# Patient Record
Sex: Female | Born: 1960 | Race: Black or African American | Hispanic: No | Marital: Married | Smoking: Former smoker
Health system: Southern US, Community
[De-identification: ages and names within clinical notes are randomized; demographics above are authoritative.]

## PROBLEM LIST (undated history)

## (undated) DIAGNOSIS — I10 Essential (primary) hypertension: Secondary | ICD-10-CM

## (undated) DIAGNOSIS — E119 Type 2 diabetes mellitus without complications: Secondary | ICD-10-CM

## (undated) DIAGNOSIS — T7840XA Allergy, unspecified, initial encounter: Secondary | ICD-10-CM

## (undated) DIAGNOSIS — K635 Polyp of colon: Secondary | ICD-10-CM

## (undated) DIAGNOSIS — M75101 Unspecified rotator cuff tear or rupture of right shoulder, not specified as traumatic: Secondary | ICD-10-CM

## (undated) DIAGNOSIS — G4730 Sleep apnea, unspecified: Secondary | ICD-10-CM

## (undated) DIAGNOSIS — E039 Hypothyroidism, unspecified: Secondary | ICD-10-CM

## (undated) DIAGNOSIS — E0590 Thyrotoxicosis, unspecified without thyrotoxic crisis or storm: Secondary | ICD-10-CM

## (undated) DIAGNOSIS — K219 Gastro-esophageal reflux disease without esophagitis: Secondary | ICD-10-CM

## (undated) DIAGNOSIS — E785 Hyperlipidemia, unspecified: Secondary | ICD-10-CM

## (undated) DIAGNOSIS — G709 Myoneural disorder, unspecified: Secondary | ICD-10-CM

## (undated) DIAGNOSIS — K760 Fatty (change of) liver, not elsewhere classified: Secondary | ICD-10-CM

## (undated) DIAGNOSIS — K449 Diaphragmatic hernia without obstruction or gangrene: Secondary | ICD-10-CM

## (undated) DIAGNOSIS — D649 Anemia, unspecified: Secondary | ICD-10-CM

## (undated) DIAGNOSIS — Z5189 Encounter for other specified aftercare: Secondary | ICD-10-CM

## (undated) DIAGNOSIS — I639 Cerebral infarction, unspecified: Secondary | ICD-10-CM

## (undated) HISTORY — DX: Cerebral infarction, unspecified: I63.9

## (undated) HISTORY — DX: Anemia, unspecified: D64.9

## (undated) HISTORY — PX: POLYPECTOMY: SHX149

## (undated) HISTORY — DX: Myoneural disorder, unspecified: G70.9

## (undated) HISTORY — DX: Type 2 diabetes mellitus without complications: E11.9

## (undated) HISTORY — DX: Encounter for other specified aftercare: Z51.89

## (undated) HISTORY — PX: THYROID SURGERY: SHX805

## (undated) HISTORY — DX: Diaphragmatic hernia without obstruction or gangrene: K44.9

## (undated) HISTORY — DX: Essential (primary) hypertension: I10

## (undated) HISTORY — DX: Polyp of colon: K63.5

## (undated) HISTORY — DX: Allergy, unspecified, initial encounter: T78.40XA

## (undated) HISTORY — DX: Fatty (change of) liver, not elsewhere classified: K76.0

## (undated) HISTORY — PX: COLONOSCOPY: SHX174

## (undated) HISTORY — DX: Hyperlipidemia, unspecified: E78.5

---

## 1987-07-30 HISTORY — PX: CHOLECYSTECTOMY: SHX55

## 1991-07-30 HISTORY — PX: TUBAL LIGATION: SHX77

## 1995-07-30 HISTORY — PX: BACK SURGERY: SHX140

## 1996-07-29 DIAGNOSIS — K449 Diaphragmatic hernia without obstruction or gangrene: Secondary | ICD-10-CM

## 1996-07-29 HISTORY — DX: Diaphragmatic hernia without obstruction or gangrene: K44.9

## 1999-01-09 ENCOUNTER — Emergency Department (HOSPITAL_COMMUNITY): Admission: EM | Admit: 1999-01-09 | Discharge: 1999-01-09 | Payer: Self-pay | Admitting: Emergency Medicine

## 1999-01-09 ENCOUNTER — Encounter: Payer: Self-pay | Admitting: Emergency Medicine

## 1999-04-11 ENCOUNTER — Encounter (INDEPENDENT_AMBULATORY_CARE_PROVIDER_SITE_OTHER): Payer: Self-pay

## 1999-04-11 ENCOUNTER — Other Ambulatory Visit: Admission: RE | Admit: 1999-04-11 | Discharge: 1999-04-11 | Payer: Self-pay

## 1999-07-04 ENCOUNTER — Encounter: Payer: Self-pay | Admitting: Gastroenterology

## 1999-07-04 ENCOUNTER — Ambulatory Visit (HOSPITAL_COMMUNITY): Admission: RE | Admit: 1999-07-04 | Discharge: 1999-07-04 | Payer: Self-pay | Admitting: Gastroenterology

## 1999-12-04 ENCOUNTER — Encounter: Payer: Self-pay | Admitting: Orthopedic Surgery

## 1999-12-04 ENCOUNTER — Encounter: Admission: RE | Admit: 1999-12-04 | Discharge: 1999-12-04 | Payer: Self-pay | Admitting: Orthopedic Surgery

## 2000-01-03 ENCOUNTER — Encounter: Payer: Self-pay | Admitting: Orthopedic Surgery

## 2000-01-03 ENCOUNTER — Ambulatory Visit (HOSPITAL_COMMUNITY): Admission: RE | Admit: 2000-01-03 | Discharge: 2000-01-03 | Payer: Self-pay | Admitting: Orthopedic Surgery

## 2000-05-08 ENCOUNTER — Encounter (INDEPENDENT_AMBULATORY_CARE_PROVIDER_SITE_OTHER): Payer: Self-pay

## 2000-05-08 HISTORY — PX: EXPLORATORY LAPAROTOMY: SUR591

## 2000-05-09 ENCOUNTER — Inpatient Hospital Stay (HOSPITAL_COMMUNITY): Admission: AD | Admit: 2000-05-09 | Discharge: 2000-05-10 | Payer: Self-pay

## 2000-06-11 ENCOUNTER — Encounter (INDEPENDENT_AMBULATORY_CARE_PROVIDER_SITE_OTHER): Payer: Self-pay

## 2000-06-11 ENCOUNTER — Other Ambulatory Visit: Admission: RE | Admit: 2000-06-11 | Discharge: 2000-06-11 | Payer: Self-pay | Admitting: Obstetrics and Gynecology

## 2000-06-25 ENCOUNTER — Inpatient Hospital Stay (HOSPITAL_COMMUNITY): Admission: RE | Admit: 2000-06-25 | Discharge: 2000-06-27 | Payer: Self-pay | Admitting: Obstetrics and Gynecology

## 2000-06-25 ENCOUNTER — Encounter (INDEPENDENT_AMBULATORY_CARE_PROVIDER_SITE_OTHER): Payer: Self-pay

## 2000-06-25 HISTORY — PX: ABDOMINAL HYSTERECTOMY: SHX81

## 2001-06-03 ENCOUNTER — Other Ambulatory Visit: Admission: RE | Admit: 2001-06-03 | Discharge: 2001-06-03 | Payer: Self-pay | Admitting: Obstetrics and Gynecology

## 2002-01-18 ENCOUNTER — Emergency Department (HOSPITAL_COMMUNITY): Admission: EM | Admit: 2002-01-18 | Discharge: 2002-01-19 | Payer: Self-pay | Admitting: Emergency Medicine

## 2002-01-18 ENCOUNTER — Encounter: Payer: Self-pay | Admitting: Emergency Medicine

## 2002-03-21 ENCOUNTER — Emergency Department (HOSPITAL_COMMUNITY): Admission: EM | Admit: 2002-03-21 | Discharge: 2002-03-21 | Payer: Self-pay | Admitting: Emergency Medicine

## 2002-03-21 ENCOUNTER — Encounter: Payer: Self-pay | Admitting: Emergency Medicine

## 2002-04-13 ENCOUNTER — Ambulatory Visit (HOSPITAL_BASED_OUTPATIENT_CLINIC_OR_DEPARTMENT_OTHER): Admission: RE | Admit: 2002-04-13 | Discharge: 2002-04-13 | Payer: Self-pay | Admitting: Internal Medicine

## 2002-07-30 ENCOUNTER — Other Ambulatory Visit: Admission: RE | Admit: 2002-07-30 | Discharge: 2002-07-30 | Payer: Self-pay | Admitting: Obstetrics and Gynecology

## 2002-09-30 ENCOUNTER — Emergency Department (HOSPITAL_COMMUNITY): Admission: EM | Admit: 2002-09-30 | Discharge: 2002-09-30 | Payer: Self-pay | Admitting: Emergency Medicine

## 2002-10-01 ENCOUNTER — Encounter: Payer: Self-pay | Admitting: Emergency Medicine

## 2003-02-15 ENCOUNTER — Encounter: Admission: RE | Admit: 2003-02-15 | Discharge: 2003-02-15 | Payer: Self-pay | Admitting: Orthopedic Surgery

## 2003-02-15 ENCOUNTER — Encounter: Payer: Self-pay | Admitting: Orthopedic Surgery

## 2003-03-28 ENCOUNTER — Encounter: Admission: RE | Admit: 2003-03-28 | Discharge: 2003-06-26 | Payer: Self-pay | Admitting: Orthopedic Surgery

## 2003-10-16 ENCOUNTER — Emergency Department (HOSPITAL_COMMUNITY): Admission: EM | Admit: 2003-10-16 | Discharge: 2003-10-16 | Payer: Self-pay | Admitting: Emergency Medicine

## 2004-04-13 ENCOUNTER — Ambulatory Visit (HOSPITAL_COMMUNITY): Admission: RE | Admit: 2004-04-13 | Discharge: 2004-04-13 | Payer: Self-pay | Admitting: Obstetrics and Gynecology

## 2004-05-04 ENCOUNTER — Other Ambulatory Visit: Admission: RE | Admit: 2004-05-04 | Discharge: 2004-05-04 | Payer: Self-pay | Admitting: Obstetrics and Gynecology

## 2004-05-31 ENCOUNTER — Ambulatory Visit (HOSPITAL_BASED_OUTPATIENT_CLINIC_OR_DEPARTMENT_OTHER): Admission: RE | Admit: 2004-05-31 | Discharge: 2004-05-31 | Payer: Self-pay | Admitting: Orthopedic Surgery

## 2004-05-31 ENCOUNTER — Ambulatory Visit (HOSPITAL_COMMUNITY): Admission: RE | Admit: 2004-05-31 | Discharge: 2004-05-31 | Payer: Self-pay | Admitting: Orthopedic Surgery

## 2004-09-20 ENCOUNTER — Ambulatory Visit: Payer: Self-pay | Admitting: Internal Medicine

## 2004-09-28 ENCOUNTER — Ambulatory Visit: Payer: Self-pay | Admitting: Internal Medicine

## 2004-11-29 ENCOUNTER — Ambulatory Visit: Payer: Self-pay | Admitting: Internal Medicine

## 2004-12-06 ENCOUNTER — Ambulatory Visit: Payer: Self-pay | Admitting: Internal Medicine

## 2005-01-30 ENCOUNTER — Ambulatory Visit: Payer: Self-pay | Admitting: Internal Medicine

## 2005-02-13 ENCOUNTER — Ambulatory Visit: Payer: Self-pay | Admitting: Internal Medicine

## 2005-03-21 ENCOUNTER — Encounter: Admission: RE | Admit: 2005-03-21 | Discharge: 2005-06-19 | Payer: Self-pay | Admitting: Internal Medicine

## 2005-05-08 ENCOUNTER — Ambulatory Visit: Payer: Self-pay | Admitting: Internal Medicine

## 2005-05-24 ENCOUNTER — Ambulatory Visit: Payer: Self-pay | Admitting: Internal Medicine

## 2005-08-12 ENCOUNTER — Ambulatory Visit: Payer: Self-pay | Admitting: Internal Medicine

## 2005-09-05 ENCOUNTER — Encounter: Admission: RE | Admit: 2005-09-05 | Discharge: 2005-09-05 | Payer: Self-pay | Admitting: Internal Medicine

## 2005-09-19 ENCOUNTER — Emergency Department (HOSPITAL_COMMUNITY): Admission: EM | Admit: 2005-09-19 | Discharge: 2005-09-20 | Payer: Self-pay | Admitting: Family Medicine

## 2005-09-23 ENCOUNTER — Ambulatory Visit: Payer: Self-pay | Admitting: Internal Medicine

## 2005-09-27 ENCOUNTER — Encounter: Payer: Self-pay | Admitting: Cardiology

## 2005-09-27 ENCOUNTER — Ambulatory Visit: Payer: Self-pay

## 2005-10-10 ENCOUNTER — Ambulatory Visit: Payer: Self-pay | Admitting: Internal Medicine

## 2005-12-04 ENCOUNTER — Encounter: Admission: RE | Admit: 2005-12-04 | Discharge: 2005-12-31 | Payer: Self-pay | Admitting: Internal Medicine

## 2005-12-11 ENCOUNTER — Ambulatory Visit: Payer: Self-pay | Admitting: Internal Medicine

## 2005-12-18 ENCOUNTER — Ambulatory Visit: Payer: Self-pay | Admitting: Internal Medicine

## 2006-01-02 ENCOUNTER — Ambulatory Visit (HOSPITAL_BASED_OUTPATIENT_CLINIC_OR_DEPARTMENT_OTHER): Admission: RE | Admit: 2006-01-02 | Discharge: 2006-01-02 | Payer: Self-pay | Admitting: Orthopedic Surgery

## 2006-02-19 ENCOUNTER — Ambulatory Visit: Payer: Self-pay | Admitting: Orthopedic Surgery

## 2006-02-19 ENCOUNTER — Ambulatory Visit: Payer: Self-pay | Admitting: Internal Medicine

## 2006-03-19 ENCOUNTER — Ambulatory Visit: Payer: Self-pay | Admitting: Orthopedic Surgery

## 2006-03-30 ENCOUNTER — Ambulatory Visit: Payer: Self-pay | Admitting: Internal Medicine

## 2006-03-30 ENCOUNTER — Inpatient Hospital Stay (HOSPITAL_COMMUNITY): Admission: EM | Admit: 2006-03-30 | Discharge: 2006-04-01 | Payer: Self-pay | Admitting: Emergency Medicine

## 2006-04-01 ENCOUNTER — Ambulatory Visit: Payer: Self-pay | Admitting: Internal Medicine

## 2006-04-01 HISTORY — PX: CARDIAC CATHETERIZATION: SHX172

## 2006-04-09 ENCOUNTER — Ambulatory Visit: Payer: Self-pay | Admitting: Internal Medicine

## 2006-04-16 ENCOUNTER — Ambulatory Visit: Payer: Self-pay | Admitting: Orthopedic Surgery

## 2006-05-06 ENCOUNTER — Ambulatory Visit: Payer: Self-pay | Admitting: Internal Medicine

## 2006-05-14 ENCOUNTER — Ambulatory Visit: Payer: Self-pay | Admitting: Internal Medicine

## 2006-05-14 LAB — CONVERTED CEMR LAB
Bilirubin, Direct: 0.1 mg/dL (ref 0.0–0.3)
Chol/HDL Ratio, serum: 4.8
Total Bilirubin: 0.8 mg/dL (ref 0.3–1.2)
Total Protein: 6.8 g/dL (ref 6.0–8.3)
Triglyceride fasting, serum: 85 mg/dL (ref 0–149)

## 2006-06-25 ENCOUNTER — Ambulatory Visit: Payer: Self-pay | Admitting: Internal Medicine

## 2006-06-25 LAB — CONVERTED CEMR LAB
Hgb A1c MFr Bld: 7 % — ABNORMAL HIGH (ref 4.6–6.0)
TSH: 2.89 microintl units/mL (ref 0.35–5.50)

## 2006-07-02 ENCOUNTER — Ambulatory Visit: Payer: Self-pay | Admitting: Internal Medicine

## 2006-09-10 ENCOUNTER — Encounter: Admission: RE | Admit: 2006-09-10 | Discharge: 2006-09-10 | Payer: Self-pay | Admitting: Internal Medicine

## 2006-10-08 ENCOUNTER — Ambulatory Visit: Payer: Self-pay | Admitting: Internal Medicine

## 2006-12-03 ENCOUNTER — Ambulatory Visit (HOSPITAL_COMMUNITY): Admission: RE | Admit: 2006-12-03 | Discharge: 2006-12-03 | Payer: Self-pay | Admitting: Surgery

## 2006-12-10 ENCOUNTER — Ambulatory Visit (HOSPITAL_COMMUNITY): Admission: RE | Admit: 2006-12-10 | Discharge: 2006-12-10 | Payer: Self-pay | Admitting: Surgery

## 2007-01-21 ENCOUNTER — Ambulatory Visit: Payer: Self-pay | Admitting: Internal Medicine

## 2007-01-28 ENCOUNTER — Ambulatory Visit: Payer: Self-pay | Admitting: Internal Medicine

## 2007-01-28 LAB — CONVERTED CEMR LAB
ALT: 37 units/L — ABNORMAL HIGH (ref 0–35)
AST: 25 units/L (ref 0–37)
Albumin: 3.9 g/dL (ref 3.5–5.2)
Alkaline Phosphatase: 87 units/L (ref 39–117)
Amylase: 58 units/L (ref 27–131)
BUN: 14 mg/dL (ref 6–23)
Basophils Absolute: 0.1 10*3/uL (ref 0.0–0.1)
Basophils Relative: 0.4 % (ref 0.0–1.0)
Bilirubin, Direct: 0.3 mg/dL (ref 0.0–0.3)
CO2: 34 meq/L — ABNORMAL HIGH (ref 19–32)
Calcium: 9.9 mg/dL (ref 8.4–10.5)
Chloride: 101 meq/L (ref 96–112)
Creatinine, Ser: 0.9 mg/dL (ref 0.4–1.2)
Eosinophils Absolute: 0.1 10*3/uL (ref 0.0–0.6)
Eosinophils Relative: 1.1 % (ref 0.0–5.0)
GFR calc Af Amer: 87 mL/min
GFR calc non Af Amer: 72 mL/min
Glucose, Bld: 160 mg/dL — ABNORMAL HIGH (ref 70–99)
HCT: 44.5 % (ref 36.0–46.0)
Hemoglobin: 14.6 g/dL (ref 12.0–15.0)
Lymphocytes Relative: 24.7 % (ref 12.0–46.0)
MCHC: 32.8 g/dL (ref 30.0–36.0)
MCV: 92.6 fL (ref 78.0–100.0)
Monocytes Absolute: 1.1 10*3/uL — ABNORMAL HIGH (ref 0.2–0.7)
Monocytes Relative: 8.6 % (ref 3.0–11.0)
Neutro Abs: 8.7 10*3/uL — ABNORMAL HIGH (ref 1.4–7.7)
Neutrophils Relative %: 65.2 % (ref 43.0–77.0)
Platelets: 474 10*3/uL — ABNORMAL HIGH (ref 150–400)
Potassium: 4.5 meq/L (ref 3.5–5.1)
RBC: 4.8 M/uL (ref 3.87–5.11)
RDW: 14.1 % (ref 11.5–14.6)
Sodium: 141 meq/L (ref 135–145)
Total Bilirubin: 1.6 mg/dL — ABNORMAL HIGH (ref 0.3–1.2)
Total Protein: 7.3 g/dL (ref 6.0–8.3)
WBC: 13.3 10*3/uL — ABNORMAL HIGH (ref 4.5–10.5)

## 2007-02-16 DIAGNOSIS — E039 Hypothyroidism, unspecified: Secondary | ICD-10-CM | POA: Insufficient documentation

## 2007-02-16 DIAGNOSIS — E119 Type 2 diabetes mellitus without complications: Secondary | ICD-10-CM | POA: Insufficient documentation

## 2007-02-16 DIAGNOSIS — I10 Essential (primary) hypertension: Secondary | ICD-10-CM | POA: Insufficient documentation

## 2007-03-27 ENCOUNTER — Telehealth (INDEPENDENT_AMBULATORY_CARE_PROVIDER_SITE_OTHER): Payer: Self-pay

## 2007-04-22 ENCOUNTER — Encounter: Admission: RE | Admit: 2007-04-22 | Discharge: 2007-07-21 | Payer: Self-pay | Admitting: Internal Medicine

## 2007-04-22 ENCOUNTER — Ambulatory Visit: Payer: Self-pay | Admitting: Internal Medicine

## 2007-04-22 LAB — CONVERTED CEMR LAB
ALT: 22 units/L (ref 0–35)
AST: 19 units/L (ref 0–37)
Albumin: 3.9 g/dL (ref 3.5–5.2)
Alkaline Phosphatase: 85 units/L (ref 39–117)
BUN: 12 mg/dL (ref 6–23)
Basophils Relative: 0.5 % (ref 0.0–1.0)
Bilirubin, Direct: 0.1 mg/dL (ref 0.0–0.3)
Cholesterol: 122 mg/dL (ref 0–200)
Creatinine, Ser: 0.7 mg/dL (ref 0.4–1.2)
Eosinophils Relative: 1.5 % (ref 0.0–5.0)
GFR calc non Af Amer: 95 mL/min
Glucose, Urine, Semiquant: NEGATIVE
HCT: 38 % (ref 36.0–46.0)
Hgb A1c MFr Bld: 8.7 % — ABNORMAL HIGH (ref 4.6–6.0)
Ketones, urine, test strip: NEGATIVE
Lymphocytes Relative: 29.7 % (ref 12.0–46.0)
Microalb Creat Ratio: 8 mg/g (ref 0.0–30.0)
Neutrophils Relative %: 62 % (ref 43.0–77.0)
Platelets: 445 10*3/uL — ABNORMAL HIGH (ref 150–400)
RBC: 4.11 M/uL (ref 3.87–5.11)
RDW: 12.8 % (ref 11.5–14.6)
Sodium: 144 meq/L (ref 135–145)
Total Bilirubin: 0.6 mg/dL (ref 0.3–1.2)
Total CHOL/HDL Ratio: 6.1
Total Protein: 7.1 g/dL (ref 6.0–8.3)
Triglycerides: 70 mg/dL (ref 0–149)
Urobilinogen, UA: 0.2
VLDL: 14 mg/dL (ref 0–40)
pH: 5.5

## 2007-04-28 ENCOUNTER — Telehealth: Payer: Self-pay | Admitting: Internal Medicine

## 2007-04-29 ENCOUNTER — Ambulatory Visit: Payer: Self-pay | Admitting: Internal Medicine

## 2007-04-30 ENCOUNTER — Encounter: Payer: Self-pay | Admitting: Internal Medicine

## 2007-05-01 ENCOUNTER — Emergency Department (HOSPITAL_COMMUNITY): Admission: EM | Admit: 2007-05-01 | Discharge: 2007-05-01 | Payer: Self-pay | Admitting: Family Medicine

## 2007-05-19 ENCOUNTER — Ambulatory Visit: Payer: Self-pay | Admitting: Vascular Surgery

## 2007-05-19 ENCOUNTER — Inpatient Hospital Stay (HOSPITAL_COMMUNITY): Admission: RE | Admit: 2007-05-19 | Discharge: 2007-05-22 | Payer: Self-pay | Admitting: Surgery

## 2007-05-19 HISTORY — PX: ROUX-EN-Y GASTRIC BYPASS: SHX1104

## 2007-06-15 ENCOUNTER — Telehealth: Payer: Self-pay | Admitting: Internal Medicine

## 2007-06-16 ENCOUNTER — Ambulatory Visit: Payer: Self-pay | Admitting: Internal Medicine

## 2007-06-16 DIAGNOSIS — E782 Mixed hyperlipidemia: Secondary | ICD-10-CM | POA: Insufficient documentation

## 2007-06-16 DIAGNOSIS — D6489 Other specified anemias: Secondary | ICD-10-CM | POA: Insufficient documentation

## 2007-06-16 DIAGNOSIS — M25559 Pain in unspecified hip: Secondary | ICD-10-CM | POA: Insufficient documentation

## 2007-06-16 LAB — CONVERTED CEMR LAB
BUN: 12 mg/dL (ref 6–23)
Calcium: 9.6 mg/dL (ref 8.4–10.5)
Cholesterol: 118 mg/dL (ref 0–200)
Folate: >20 ng/mL
TSH: 7.32 microintl units/mL — ABNORMAL HIGH (ref 0.35–5.50)
Total CHOL/HDL Ratio: 6.4
VLDL: 14 mg/dL (ref 0–40)
Vitamin B-12: >1500 pg/mL — ABNORMAL HIGH (ref 211–911)

## 2007-07-01 ENCOUNTER — Encounter: Payer: Self-pay | Admitting: Internal Medicine

## 2007-07-03 ENCOUNTER — Encounter: Admission: RE | Admit: 2007-07-03 | Discharge: 2007-07-03 | Payer: Self-pay | Admitting: Surgery

## 2007-07-15 ENCOUNTER — Encounter: Admission: RE | Admit: 2007-07-15 | Discharge: 2007-07-16 | Payer: Self-pay | Admitting: Internal Medicine

## 2007-08-01 ENCOUNTER — Emergency Department (HOSPITAL_COMMUNITY): Admission: EM | Admit: 2007-08-01 | Discharge: 2007-08-01 | Payer: Self-pay | Admitting: Emergency Medicine

## 2007-08-19 ENCOUNTER — Ambulatory Visit: Payer: Self-pay | Admitting: Internal Medicine

## 2007-08-20 LAB — CONVERTED CEMR LAB
Folate: >20 ng/mL
Hgb A1c MFr Bld: 5.5 % (ref 4.6–6.0)
Iron: 63 ug/dL (ref 42–145)
TSH: 8.68 microintl units/mL — ABNORMAL HIGH (ref 0.35–5.50)
Transferrin: 193.4 mg/dL — ABNORMAL LOW (ref >=212.0)
Vitamin B-12: >1500 pg/mL — ABNORMAL HIGH (ref 211–911)

## 2007-09-09 ENCOUNTER — Encounter: Admission: RE | Admit: 2007-09-09 | Discharge: 2007-09-09 | Payer: Self-pay | Admitting: Surgery

## 2007-09-21 ENCOUNTER — Telehealth: Payer: Self-pay | Admitting: *Deleted

## 2007-10-28 ENCOUNTER — Ambulatory Visit: Payer: Self-pay | Admitting: Internal Medicine

## 2007-10-28 DIAGNOSIS — L732 Hidradenitis suppurativa: Secondary | ICD-10-CM | POA: Insufficient documentation

## 2007-10-28 LAB — CONVERTED CEMR LAB: TSH: 4.66 microintl units/mL (ref 0.35–5.50)

## 2007-11-04 ENCOUNTER — Encounter: Admission: RE | Admit: 2007-11-04 | Discharge: 2007-11-04 | Payer: Self-pay | Admitting: Surgery

## 2007-12-25 ENCOUNTER — Telehealth: Payer: Self-pay | Admitting: Internal Medicine

## 2008-01-27 ENCOUNTER — Ambulatory Visit: Payer: Self-pay | Admitting: Internal Medicine

## 2008-01-27 DIAGNOSIS — J209 Acute bronchitis, unspecified: Secondary | ICD-10-CM | POA: Insufficient documentation

## 2008-02-17 ENCOUNTER — Encounter: Admission: RE | Admit: 2008-02-17 | Discharge: 2008-04-19 | Payer: Self-pay | Admitting: Internal Medicine

## 2008-03-21 ENCOUNTER — Telehealth: Payer: Self-pay | Admitting: Internal Medicine

## 2008-05-27 ENCOUNTER — Ambulatory Visit: Payer: Self-pay | Admitting: Internal Medicine

## 2008-05-27 LAB — CONVERTED CEMR LAB
ALT: 21 units/L (ref 0–35)
Cholesterol: 112 mg/dL (ref 0–200)
LDL Cholesterol: 69 mg/dL (ref 0–99)
TSH: 0.16 microintl units/mL — ABNORMAL LOW (ref 0.35–5.50)
Total Bilirubin: 0.7 mg/dL (ref 0.3–1.2)
Total Protein: 6.9 g/dL (ref 6.0–8.3)

## 2008-06-01 ENCOUNTER — Ambulatory Visit: Payer: Self-pay | Admitting: Internal Medicine

## 2008-06-01 DIAGNOSIS — Z9884 Bariatric surgery status: Secondary | ICD-10-CM | POA: Insufficient documentation

## 2008-06-01 LAB — CONVERTED CEMR LAB
Calcium: 9.2 mg/dL (ref 8.4–10.5)
Iron: 64 ug/dL (ref 42–145)
Magnesium: 1.9 mg/dL (ref 1.5–2.5)
Saturation Ratios: 21.4 % (ref 20.0–50.0)
Transferrin: 213.9 mg/dL (ref >=212.0)

## 2008-08-14 ENCOUNTER — Emergency Department (HOSPITAL_COMMUNITY): Admission: EM | Admit: 2008-08-14 | Discharge: 2008-08-14 | Payer: Self-pay | Admitting: Family Medicine

## 2008-11-29 ENCOUNTER — Ambulatory Visit: Payer: Self-pay | Admitting: Internal Medicine

## 2008-11-29 DIAGNOSIS — K2900 Acute gastritis without bleeding: Secondary | ICD-10-CM | POA: Insufficient documentation

## 2008-11-29 LAB — CONVERTED CEMR LAB
BUN: 13 mg/dL (ref 6–23)
Basophils Absolute: 0 10*3/uL (ref 0.0–0.1)
Basophils Relative: 0.6 % (ref 0.0–3.0)
CO2: 32 meq/L (ref 19–32)
Calcium: 9.4 mg/dL (ref 8.4–10.5)
Creatinine, Ser: 0.4 mg/dL (ref 0.4–1.2)
Hemoglobin: 13 g/dL (ref 12.0–15.0)
Lymphocytes Relative: 38.9 % (ref 12.0–46.0)
Lymphs Abs: 2.5 10*3/uL (ref 0.7–4.0)
MCV: 90.6 fL (ref 78.0–100.0)
Platelets: 331 10*3/uL (ref 150.0–400.0)
RDW: 13.1 % (ref 11.5–14.6)
Saturation Ratios: 23.9 % (ref 20.0–50.0)
Transferrin: 218.2 mg/dL (ref 212.0–360.0)
Vitamin B-12: 685 pg/mL (ref 211–911)
WBC: 6.4 10*3/uL (ref 4.5–10.5)

## 2009-02-16 ENCOUNTER — Ambulatory Visit: Payer: Self-pay | Admitting: Internal Medicine

## 2009-02-16 DIAGNOSIS — K2990 Gastroduodenitis, unspecified, without bleeding: Secondary | ICD-10-CM

## 2009-02-16 DIAGNOSIS — K2970 Gastritis, unspecified, without bleeding: Secondary | ICD-10-CM | POA: Insufficient documentation

## 2009-03-03 ENCOUNTER — Emergency Department (HOSPITAL_BASED_OUTPATIENT_CLINIC_OR_DEPARTMENT_OTHER): Admission: EM | Admit: 2009-03-03 | Discharge: 2009-03-03 | Payer: Self-pay | Admitting: Emergency Medicine

## 2009-03-03 ENCOUNTER — Ambulatory Visit: Payer: Self-pay | Admitting: Diagnostic Radiology

## 2009-03-03 ENCOUNTER — Encounter: Payer: Self-pay | Admitting: Internal Medicine

## 2009-03-07 ENCOUNTER — Ambulatory Visit: Payer: Self-pay | Admitting: Internal Medicine

## 2009-03-07 DIAGNOSIS — R079 Chest pain, unspecified: Secondary | ICD-10-CM | POA: Insufficient documentation

## 2009-07-18 ENCOUNTER — Emergency Department (HOSPITAL_BASED_OUTPATIENT_CLINIC_OR_DEPARTMENT_OTHER): Admission: EM | Admit: 2009-07-18 | Discharge: 2009-07-18 | Payer: Self-pay | Admitting: Emergency Medicine

## 2009-07-18 ENCOUNTER — Ambulatory Visit: Payer: Self-pay | Admitting: Internal Medicine

## 2009-07-18 ENCOUNTER — Telehealth: Payer: Self-pay | Admitting: Internal Medicine

## 2009-07-18 DIAGNOSIS — F489 Nonpsychotic mental disorder, unspecified: Secondary | ICD-10-CM | POA: Insufficient documentation

## 2009-07-18 DIAGNOSIS — M542 Cervicalgia: Secondary | ICD-10-CM | POA: Insufficient documentation

## 2009-07-24 ENCOUNTER — Telehealth: Payer: Self-pay | Admitting: Internal Medicine

## 2009-07-27 ENCOUNTER — Encounter: Admission: RE | Admit: 2009-07-27 | Discharge: 2009-07-27 | Payer: Self-pay | Admitting: Internal Medicine

## 2009-08-03 ENCOUNTER — Encounter: Payer: Self-pay | Admitting: Internal Medicine

## 2009-08-10 ENCOUNTER — Ambulatory Visit (HOSPITAL_COMMUNITY): Admission: RE | Admit: 2009-08-10 | Discharge: 2009-08-11 | Payer: Self-pay | Admitting: Neurosurgery

## 2009-08-10 HISTORY — PX: LAMINOTOMY / EXCISION DISK POSTERIOR CERVICAL SPINE: SUR749

## 2009-09-01 ENCOUNTER — Encounter: Payer: Self-pay | Admitting: Internal Medicine

## 2009-09-12 ENCOUNTER — Ambulatory Visit: Payer: Self-pay | Admitting: Internal Medicine

## 2009-09-12 LAB — CONVERTED CEMR LAB
ALT: 18 units/L (ref 0–35)
AST: 19 units/L (ref 0–37)
Albumin: 3.9 g/dL (ref 3.5–5.2)
Alkaline Phosphatase: 69 units/L (ref 39–117)
BUN: 11 mg/dL (ref 6–23)
Bilirubin, Direct: 0.1 mg/dL (ref 0.0–0.3)
CO2: 32 meq/L (ref 19–32)
Chloride: 107 meq/L (ref 96–112)
HCT: 38.2 % (ref 36.0–46.0)
HDL: 45.4 mg/dL (ref >39.00)
Ketones, ur: NEGATIVE mg/dL
LDL Cholesterol: 94 mg/dL (ref 0–99)
Leukocytes, UA: NEGATIVE
Lymphocytes Relative: 34.5 % (ref 12.0–46.0)
Lymphs Abs: 1.8 10*3/uL (ref 0.7–4.0)
MCHC: 33.6 g/dL (ref 30.0–36.0)
Monocytes Absolute: 0.5 10*3/uL (ref 0.1–1.0)
Monocytes Relative: 9.4 % (ref 3.0–12.0)
Neutrophils Relative %: 53.8 % (ref 43.0–77.0)
Nitrite: NEGATIVE
Platelets: 309 10*3/uL (ref 150.0–400.0)
RDW: 13.8 % (ref 11.5–14.6)
Specific Gravity, Urine: >=1.03 (ref 1.000–1.030)
Total Bilirubin: 0.6 mg/dL (ref 0.3–1.2)
Total CHOL/HDL Ratio: 3
Triglycerides: 49 mg/dL (ref 0.0–149.0)
Urine Glucose: NEGATIVE mg/dL
Urobilinogen, UA: 0.2 (ref 0.0–1.0)
WBC: 5.3 10*3/uL (ref 4.5–10.5)
pH: 5.5 (ref 5.0–8.0)

## 2009-09-19 ENCOUNTER — Ambulatory Visit: Payer: Self-pay | Admitting: Internal Medicine

## 2009-11-14 ENCOUNTER — Ambulatory Visit: Payer: Self-pay | Admitting: Internal Medicine

## 2009-11-14 DIAGNOSIS — N760 Acute vaginitis: Secondary | ICD-10-CM | POA: Insufficient documentation

## 2009-11-14 LAB — CONVERTED CEMR LAB
GFR calc non Af Amer: 168.19 mL/min (ref >60)
Sodium: 147 meq/L — ABNORMAL HIGH (ref 135–145)

## 2009-12-01 ENCOUNTER — Encounter: Payer: Self-pay | Admitting: Internal Medicine

## 2009-12-13 ENCOUNTER — Encounter: Admission: RE | Admit: 2009-12-13 | Discharge: 2009-12-13 | Payer: Self-pay | Admitting: Internal Medicine

## 2010-05-02 ENCOUNTER — Encounter: Payer: Self-pay | Admitting: Internal Medicine

## 2010-05-28 ENCOUNTER — Encounter: Payer: Self-pay | Admitting: Internal Medicine

## 2010-06-07 ENCOUNTER — Encounter: Payer: Self-pay | Admitting: Internal Medicine

## 2010-06-13 ENCOUNTER — Encounter: Admission: RE | Admit: 2010-06-13 | Discharge: 2010-06-13 | Payer: Self-pay | Admitting: Surgery

## 2010-06-18 ENCOUNTER — Encounter: Payer: Self-pay | Admitting: Internal Medicine

## 2010-07-26 ENCOUNTER — Ambulatory Visit
Admission: RE | Admit: 2010-07-26 | Discharge: 2010-07-27 | Payer: Self-pay | Source: Home / Self Care | Attending: Orthopedic Surgery | Admitting: Orthopedic Surgery

## 2010-07-26 HISTORY — PX: SHOULDER ARTHROSCOPY DISTAL CLAVICLE EXCISION AND OPEN ROTATOR CUFF REPAIR: SHX2396

## 2010-08-07 IMAGING — CR DG CERVICAL SPINE WITH FLEX & EXTEND
7 series · 7 of 7 positions shown · non-contrast
Comparison: None

CLINICAL DATA: Neck pain and right arm pain and tingling/numbness
fingers

CERVICAL SPINE COMPLETE WITH FLEXION AND EXTENSION VIEWS

[view not recorded (1 of 7)]
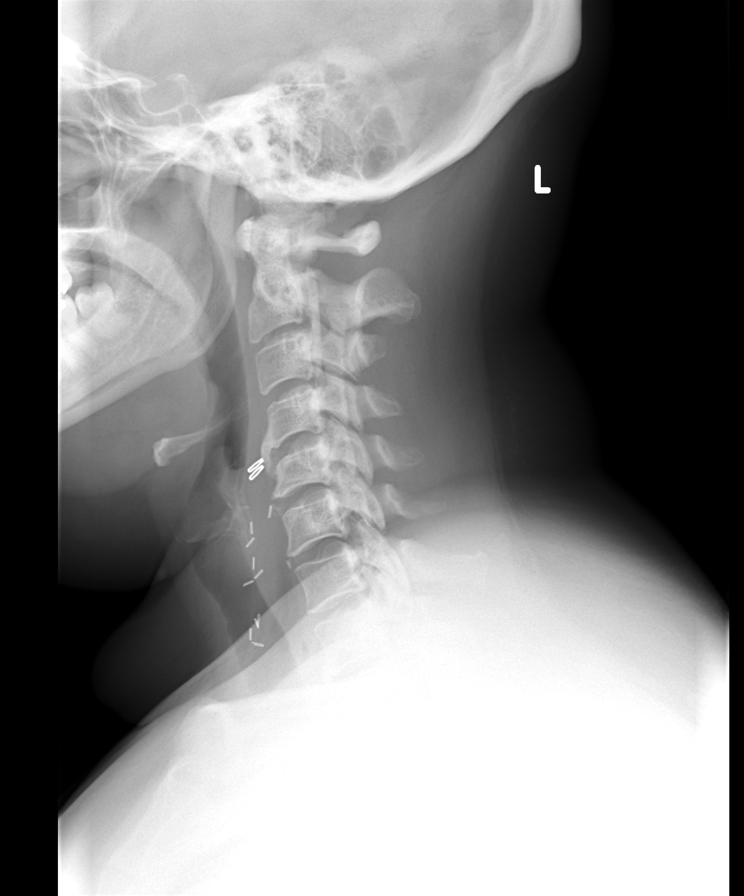

[view not recorded (2 of 7)]
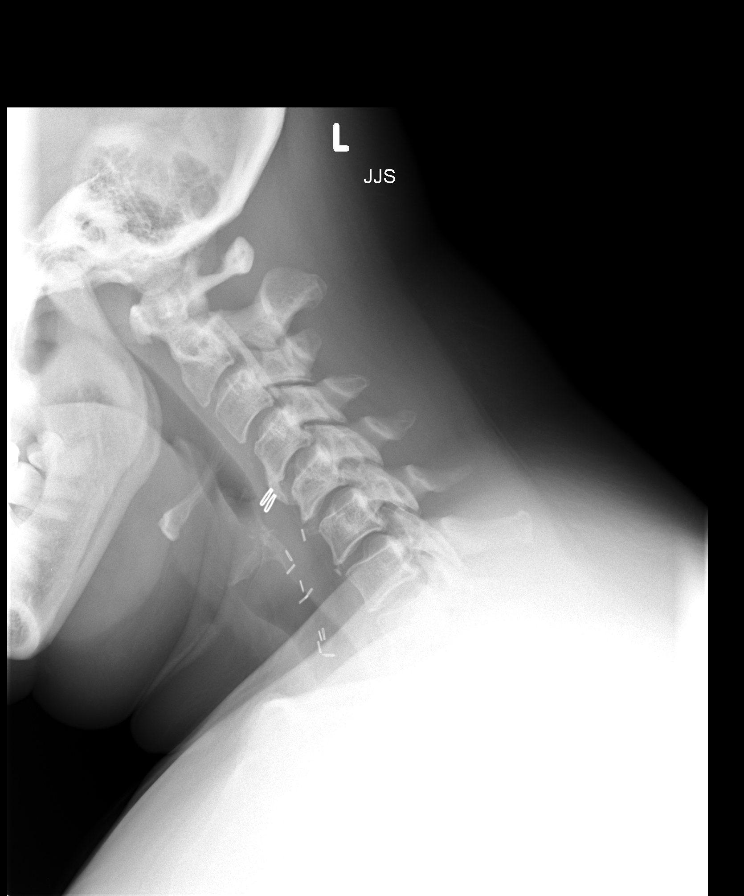

[view not recorded (3 of 7)]
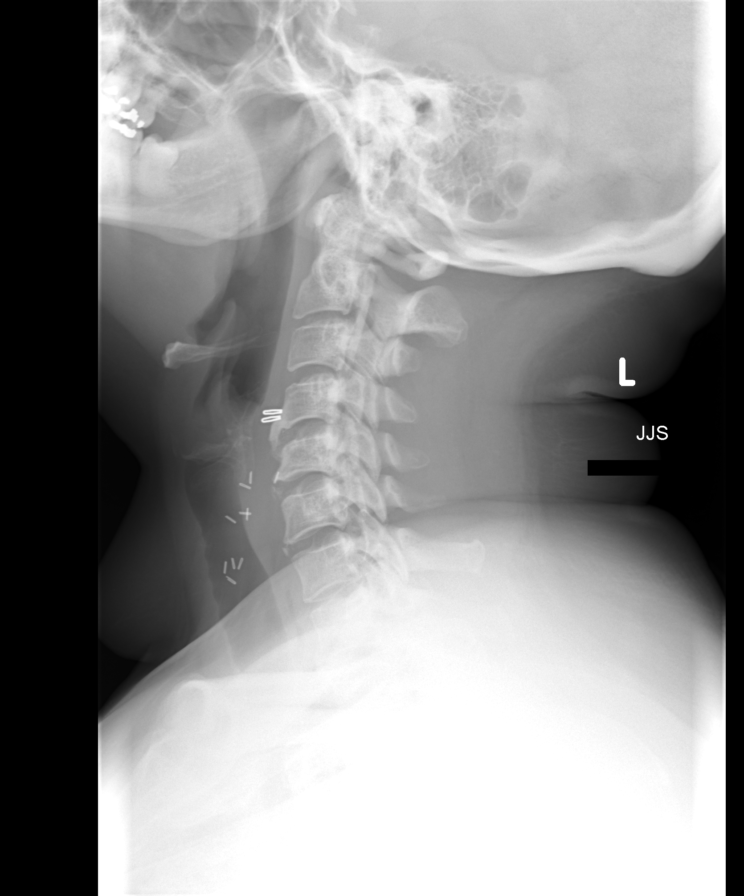

[view not recorded (4 of 7)]
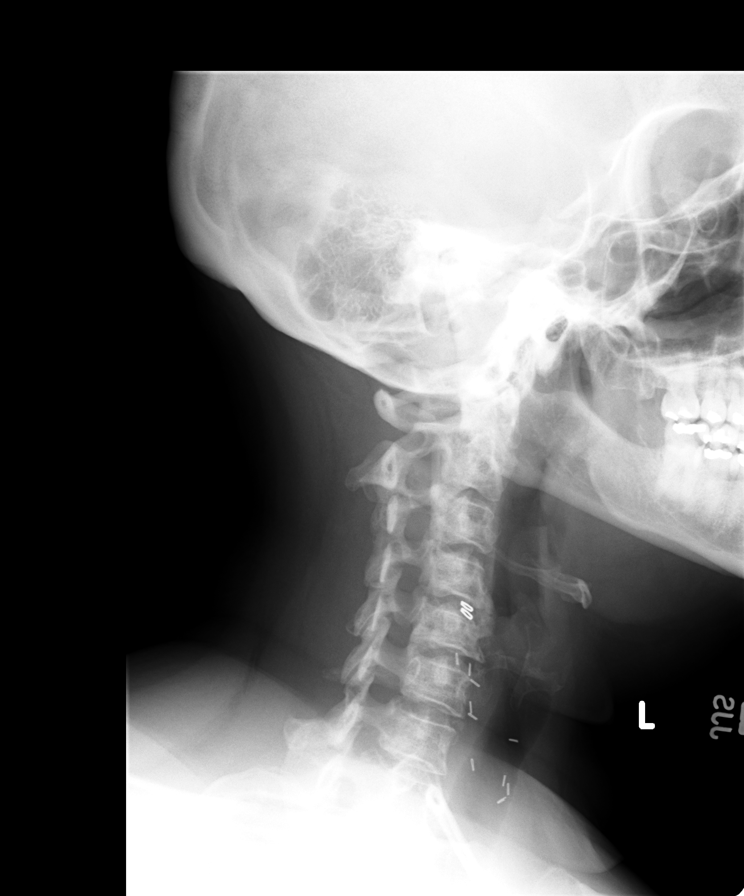

[view not recorded (5 of 7)]
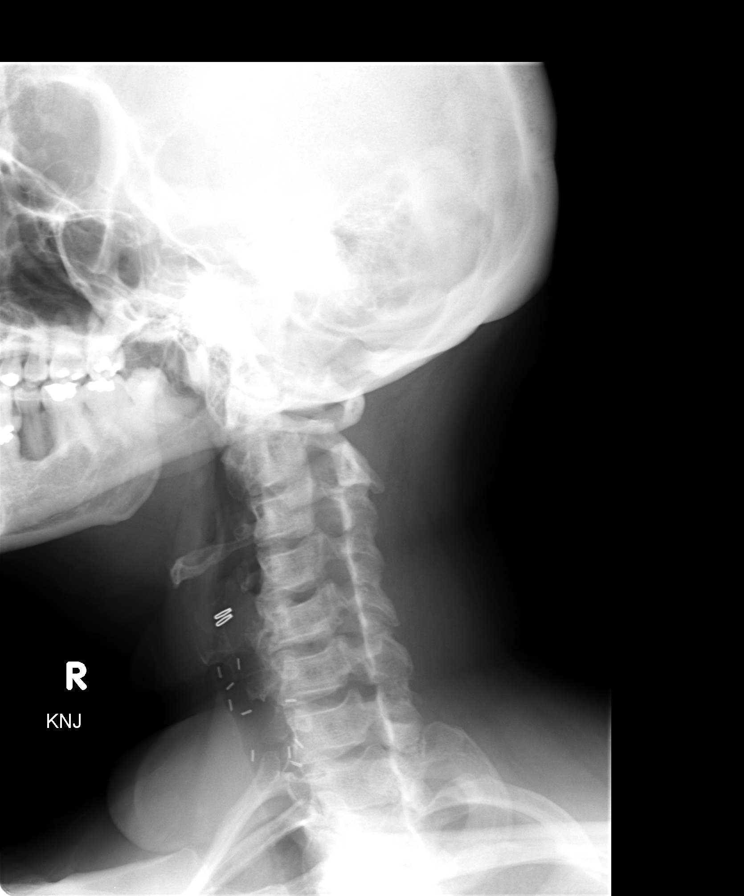

[view not recorded (6 of 7)]
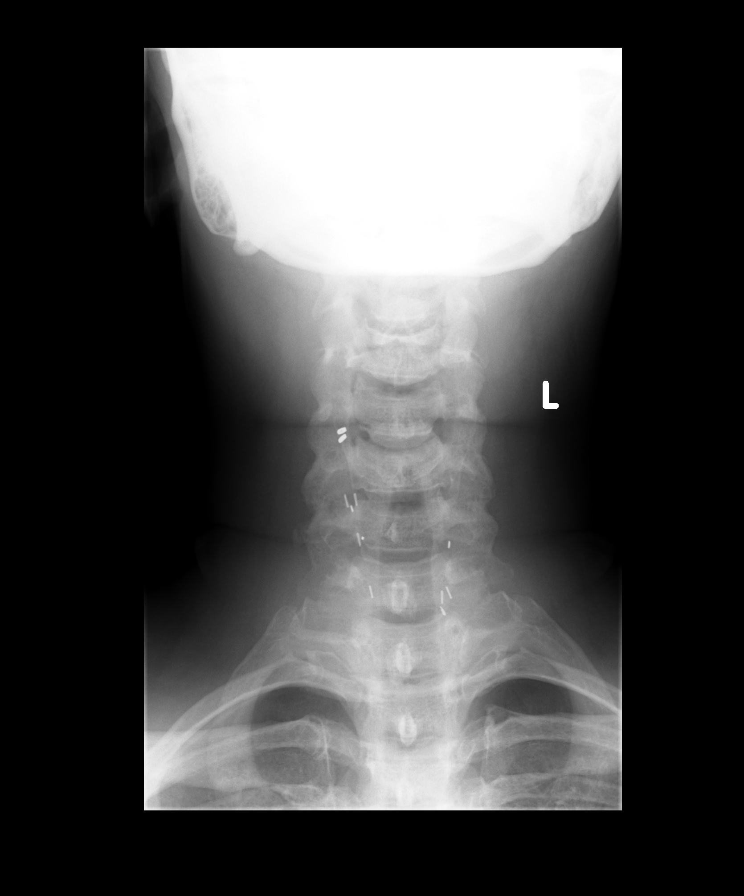

[view not recorded (7 of 7)]
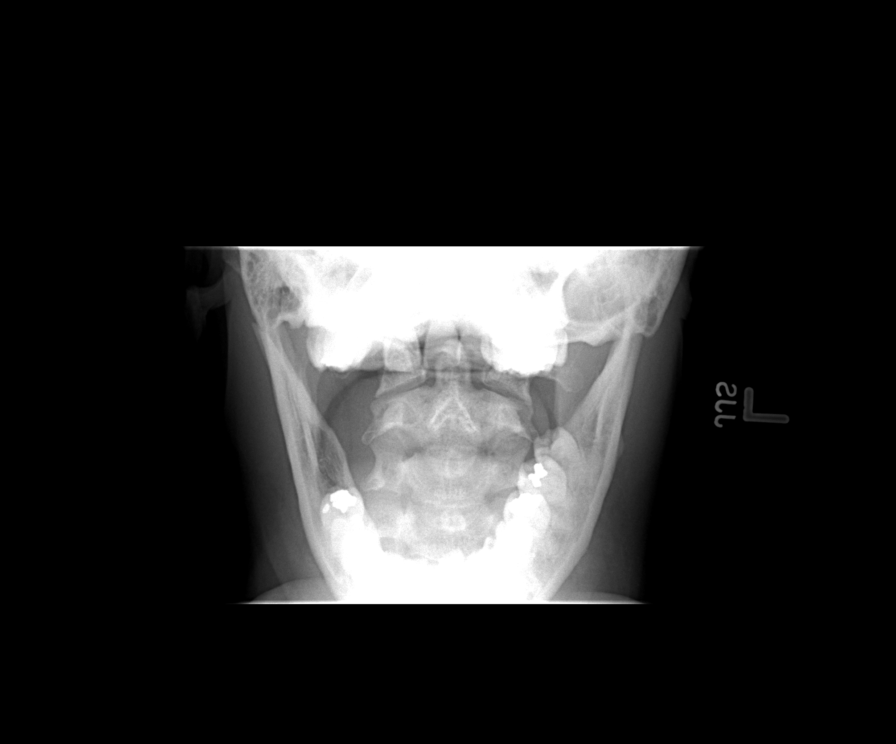

[7 of 7 positions shown; findings below may reference images not displayed]

FINDINGS: There is ossification of the posterior longitudinal
ligament (OPLL)  from C2-C4 .  Anterior longitudinal ligament
ossification at C4-5.  No disc space narrowing.  Good range of
motion with flexion and extension.  No subluxation.  Bony neural
foramina are patent.  Minimal encroachment on the right C4-5
foramen by osteophytes. Good range of motion with flexion and
extension.
IMPRESSION: OPLL.  No significant appearing stenosis.

## 2010-08-18 ENCOUNTER — Encounter: Payer: Self-pay | Admitting: Surgery

## 2010-08-28 NOTE — Assessment & Plan Note (Signed)
Summary: cpx/no pap/njr   Vital Signs:  Patient profile:   50 year old female Height:      64 inches Weight:      164 pounds BMI:     28.25 Temp:     98.2 degrees F oral Pulse rate:   72 / minute Resp:     14 per minute BP sitting:   120 / 82  (left arm)  Vitals Entered By: Willy Eddy, LPN (September 19, 2009 2:38 PM) CC: cpx   CC:  cpx.  History of Present Illness: increased weight depression over family issues, divorce The pt was asked about all immunizations, health maint. 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) 3)  Chest Pain, Acute  (ICD-786.50) 4)  Gastritis  (ICD-535.50) 5)  Acute Gastritis Without Mention of Hemorrhage  (ICD-535.00) 6)  Bariatric Surgery Status  (ICD-V45.86) 7)  Acute Bronchitis  (ICD-466.0) 8)  Hidradenitis Suppurativa  (ICD-705.83) 9)  Hip Pain, Right  (ICD-719.45) 10)  Hyperlipidemia  (ICD-272.4) 11)  Other Specified Anemias  (ICD-285.8) 12)  Physical Examination  (ICD-V70.0) 13)  Hypothyroidism  (ICD-244.9) 14)  Hypertension  (ICD-401.9) 15)  Diabetes Mellitus, Type II  (ICD-250.00)  Current Problems (verified): 1)  Neck Pain  (ICD-723.1) 2)  Neurosis,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 Once Daily 2)  Multivitamin/iron   Tabs (Multiple Vitamins-Iron) .... Once Daily 3)  Synthroid 150 Mcg  Tabs (Levothyroxine Sodium) .... One By Mouth Daily 4)  Onetouch Ultra Test   Strp (Glucose Blood) .... Blood Sugar Testing Two Times A Day 5)  Nexium 40 Mg Cpdr (Esomeprazole Magnesium) .... One By Mouth Daily 6)  Sucralfate 1 Gm Tabs (Sucralfate) .... One By Mouth Tid 7)  Hydrocodone-Acetaminophen 10-750 Mg Tabs (Hydrocodone-Acetaminophen) .Marland Kitchen.. 1 Every 4-6 Hours As Needed Pain  Current Medications (verified): 1)  1st Choice Lancets Ultra Thin   Misc (Lancets) .... Once Daily 2)  Multivitamin/iron   Tabs (Multiple Vitamins-Iron) .... Once Daily 3)  Synthroid 150 Mcg  Tabs (Levothyroxine Sodium) .... One By Mouth Daily 4)  Onetouch Ultra Test   Strp (Glucose Blood) .... Blood Sugar Testing Two Times A Day  Allergies (verified): 1)  Tylox (Oxycodone-Acetaminophen)  Past History:  Family History: Last updated: 04/29/2007 n/c  Social History: Last updated: 02/16/2007 Married Alcohol use-no  Risk Factors: Smoking Status: never (09/19/2009)  Past medical, surgical, family and social histories (including risk factors) reviewed, and no changes noted (except as noted below).  Past Medical History: Reviewed history from 06/01/2008 and no changes required. Diabetes mellitus, type II   status post baractric surgery status Chronic Back Pain Hypertension Hypothyroidism Hyperlipidemia  Past Surgical History: Reviewed history from 06/01/2008 and  no changes required. Cholecystectomy TAH-06/25/2000 Cardiac Cath- 04/01/2006 Tubal ligation Back sx-1998 bariatric surgery May 10 2007  Family History: Reviewed history from 04/29/2007 and no changes required. n/c  Social History: Reviewed history from 02/16/2007 and no changes required. Married Alcohol use-no  Review of Systems  The patient denies anorexia,  fever, weight loss, weight gain, vision loss, decreased hearing, hoarseness, chest pain, syncope, dyspnea on exertion, peripheral edema, prolonged cough, headaches, hemoptysis, abdominal pain, melena, hematochezia, severe indigestion/heartburn, hematuria, incontinence, genital sores, muscle weakness, suspicious skin lesions, transient blindness, difficulty walking, depression, unusual weight change, abnormal bleeding, enlarged lymph nodes, angioedema, breast masses, and testicular masses.    Physical Exam  General:  Well-developed,well-nourished,in no acute distress; alert,appropriate and cooperative throughout examination Head:  Normocephalic and atraumatic without obvious abnormalities. No apparent alopecia or balding. Eyes:  pupils equal and pupils round.   Ears:  R ear normal and L ear normal.   Nose:  no external deformity and no nasal discharge.   Mouth:  pharynx pink and moist and no erythema.   Neck:  No deformities, masses, or tenderness noted. Lungs:  normal respiratory effort and no wheezes.   Heart:  normal rate and regular rhythm.   Abdomen:  soft and no splenomegaly.   Msk:  No deformity or scoliosis noted of thoracic or lumbar spine.   Extremities:  No clubbing, cyanosis, edema, or deformity noted with normal full range of motion of all joints.   Neurologic:  No cranial nerve deficits noted. Station and gait are normal. Plantar reflexes are down-going bilaterally. DTRs are symmetrical throughout. Sensory, motor and coordinative functions appear intact.   Impression & Recommendations:  Problem # 1:  PHYSICAL EXAMINATION (ICD-V70.0)  Mammogram: normal (02/25/2009) Td Booster: Historical (03/29/2005)   Chol: 149 (09/12/2009)   HDL: 45.40 (09/12/2009)   LDL: 94 (09/12/2009)   TG: 49.0 (09/12/2009) TSH: 0.32 (09/12/2009)   HgbA1C: 5.5 (08/19/2007)   Next mammogram due:: 02/2010 (09/19/2009)  Discussed using sunscreen, use of alcohol, drug use, self breast exam, routine dental  care, routine eye care, schedule for GYN exam, routine physical exam, seat belts, multiple vitamins, osteoporosis prevention, adequate calcium intake in diet, recommendations for immunizations, mammograms and Pap smears.  Discussed exercise and checking cholesterol.  Discussed gun safety, safe sex, and contraception.  Problem # 2:  HYPERTENSION (ICD-401.9)  BP today: 120/82 Prior BP: 120/80 (03/07/2009)  Prior 10 Yr Risk Heart Disease: 8 % (11/29/2008)  Labs Reviewed: K+: 3.7 (09/12/2009) Creat: : 0.6 (09/12/2009)   Chol: 149 (09/12/2009)   HDL: 45.40 (09/12/2009)   LDL: 94 (09/12/2009)   TG: 49.0 (09/12/2009)  Complete Medication List: 1)  1st Choice Lancets Ultra Thin Misc (Lancets) .... Once daily 2)  Multivitamin/iron Tabs (Multiple vitamins-iron) .... Once daily 3)  Synthroid 150 Mcg Tabs (Levothyroxine sodium) .... One by mouth daily 4)  Onetouch Ultra Test Strp (Glucose blood) .... Blood sugar testing two times a day 5)  Lexapro 10 Mg Tabs (Escitalopram oxalate) .... One by mouth daily 6)  Clotrimazole-betamethasone 1-0.05 % Crea (Clotrimazole-betamethasone) .... Apply to foott plantar surface two times a day  Patient Instructions: 1)  Please schedule a follow-up appointment in 2 months. Prescriptions: CLOTRIMAZOLE-BETAMETHASONE 1-0.05 % CREA (CLOTRIMAZOLE-BETAMETHASONE) apply to foott plantar surface two times a day  #60gm1 x 0   Entered and Authorized by:   Stacie Glaze MD   Signed by:   Stacie Glaze MD on 09/19/2009   Method used:   Electronically to  [facility]  [address] [phone]* (retail)       [address]       [place], [place]  [address]       Ph: [phone]       Fax: [phone]   RxID:   [id number]    Immunization History:  Tetanus/Td Immunization History:    Tetanus/Td:  historical (03/29/2005)    Preventive Care Screening  Mammogram:    Date:  02/25/2009    Next Due:  02/2010    Results:  normal   Last Tetanus Booster:    Date:   03/29/2005    Results:  Historical    Contraindications/Deferment of Procedures/Staging:    Test/Procedure: PAP Smear    Reason for deferment: hysterectomy

## 2010-08-28 NOTE — Letter (Signed)
Summary: [facility]   Imported By: Maryln Gottron 06/28/2010 10:27:48  _____________________________________________________________________  External Attachment:    Type:   Image     Comment:   External Document

## 2010-08-28 NOTE — Consult Note (Signed)
Summary: [facility]  [facility] [place]   Imported By: Maryln Gottron 05/29/2010 13:14:59  _____________________________________________________________________  External Attachment:    Type:   Image     Comment:   External Document

## 2010-08-28 NOTE — Consult Note (Signed)
Summary: [facility]   Imported By: Sherian Rein 06/13/2010 10:11:11  _____________________________________________________________________  External Attachment:    Type:   Image     Comment:   External Document

## 2010-08-28 NOTE — Assessment & Plan Note (Signed)
Summary: 8 week fup//ccm   Vital Signs:  Patient profile:   50 year old female Height:      64 inches Weight:      166 pounds BMI:     28.60 Temp:     98.0 degrees F oral Pulse rate:   76 / minute Resp:     14 per minute BP sitting:   130 / 80  (left arm)  Vitals Entered By: Willy Eddy, LPN (November 14, 2009 4:14 PM) CC: roa    CC:  roa .  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 Once Daily 2)  Multivitamin/iron   Tabs (Multiple Vitamins-Iron) .... Once Daily 3)  Synthroid 150 Mcg  Tabs (Levothyroxine Sodium) .... One By Mouth Daily 4)  Onetouch Ultra Test   Strp (Glucose Blood) .... Blood Sugar Testing Two Times A Day 5)  Lexapro 10 Mg Tabs (Escitalopram Oxalate) .... One By Mouth Daily 6)  Clotrimazole-Betamethasone 1-0.05 % Crea (Clotrimazole-Betamethasone) .... Apply To Foott Plantar Surface Two Times A Day  Allergies  (verified): 1)  Tylox (Oxycodone-Acetaminophen)   Impression & Recommendations:  Problem # 1:  VAGINITIS, BACTERIAL (ICD-616.10)  Discussed symptomatic relief and treatment options.   Her updated medication list for this problem includes:    Metronidazole 500 Mg Tabs (Metronidazole) ..... One by mouth two times a day for 7 days  Problem # 2:  HYPERTENSION (ICD-401.9)  Orders: TLB-BMP (Basic Metabolic Panel-BMET) (80048-METABOL)  BP today: 130/80 Prior BP: 120/82 (09/19/2009)  Prior 10 Yr Risk Heart Disease: 8 % (11/29/2008)  Labs Reviewed: K+: 3.7 (09/12/2009) Creat: : 0.6 (09/12/2009)   Chol: 149 (09/12/2009)   HDL: 45.40 (09/12/2009)   LDL: 94 (09/12/2009)   TG: 49.0 (09/12/2009)  Problem # 3:  DIABETES MELLITUS, TYPE II (ICD-250.00) screening with the vaginosis to make sure that the blood glucose has not increased Orders: TLB-BMP (Basic Metabolic Panel-BMET) (80048-METABOL) Venipuncture (16109) TLB-A1C / Hgb A1C (Glycohemoglobin) (83036-A1C)  Labs Reviewed: Creat: 0.6 (09/12/2009)    Reviewed HgBA1c results: 5.5 (08/19/2007)  6.4 (06/16/2007)  Problem # 4:  HYPOTHYROIDISM (ICD-244.9)  Her updated medication list for this problem includes:    Synthroid 150 Mcg Tabs (Levothyroxine sodium) ..... One by mouth daily  Labs Reviewed: TSH: 0.32 (09/12/2009)   Total T4: 5.1 (06/25/2006)    HgBA1c: 5.5 (08/19/2007) Chol: 149 (09/12/2009)   HDL: 45.40 (09/12/2009)   LDL: 94 (09/12/2009)   TG: 49.0 (09/12/2009)  Complete Medication  List: 1)  1st Choice Lancets Ultra Thin Misc (Lancets) .... Once daily 2)  Multivitamin/iron Tabs (Multiple vitamins-iron) .... Once daily 3)  Synthroid 150 Mcg Tabs (Levothyroxine sodium) .... One by mouth daily 4)  Onetouch Ultra Test Strp (Glucose blood) .... Blood sugar testing two times a day 5)  Lexapro 10 Mg Tabs (Escitalopram oxalate) .... One by mouth daily 6)  Clotrimazole-betamethasone 1-0.05 % Crea (Clotrimazole-betamethasone) ....  Apply to foott plantar surface two times a day 7)  Metronidazole 500 Mg Tabs (Metronidazole) .... One by mouth two times a day for 7 days 8)  Fluconazole 100 Mg Tabs (Fluconazole) .... One by mouth daily for 7 days 9)  Transderm-scop 1.5 Mg Pt72 (Scopolamine base) .... One path to skin every 72 hours prn  Patient Instructions: 1)  have the young child tested for giardia... as a precaution 2)  take the metronitazol and the diflucan first and call if the discharge does not stop 3)  Please schedule a follow-up appointment in 4 months. Prescriptions: TRANSDERM-SCOP 1.5 MG PT72 (SCOPOLAMINE BASE) one path to skin every 72 hours prn  #3 x 0   Entered and Authorized by:   Stacie Glaze MD   Signed by:   Stacie Glaze MD on 11/14/2009   Method used:   Electronically to        [facility] [phone]* (retail)       [address]       [place], [place]  [address]       Ph: [phone]       Fax: [phone]   RxID:   [id number] FLUCONAZOLE 100 MG TABS (FLUCONAZOLE) one by mouth daily for 7 days  #7 x 0   Entered and Authorized by:   Stacie Glaze MD   Signed by:   Stacie Glaze MD on 11/14/2009   Method used:   Electronically to        [facility] [address] [phone]* (retail)       [address]       [place], [place]  [address]       Ph: [phone]       Fax: [phone]   RxID:   [id number] METRONIDAZOLE 500 MG TABS (METRONIDAZOLE) one by mouth two times a day for 7 days  #4 x 0   Entered and Authorized by:   Stacie Glaze MD   Signed by:   Stacie Glaze MD on 11/14/2009   Method used:   Electronically to        [facility] [address] [phone]* (retail)       [address]       [place], [place]  [address]       Ph: [phone]       Fax: [phone]   RxID:   [id number]

## 2010-08-28 NOTE — Letter (Signed)
Summary: [facility]  [facility]   Imported By: Maryln Gottron 12/15/2009 13:40:53  _____________________________________________________________________  External Attachment:    Type:   Image     Comment:   External Document

## 2010-08-28 NOTE — Consult Note (Signed)
Summary: [facility]  [facility]   Imported By: Maryln Gottron 08/11/2009 15:58:18  _____________________________________________________________________  External Attachment:    Type:   Image     Comment:   External Document

## 2010-08-28 NOTE — Letter (Signed)
[facility]: [facility]   Imported By: Maryln Gottron 07/04/2010 09:53:36  _____________________________________________________________________  External Attachment:    Type:   Image     Comment:   External Document

## 2010-08-31 NOTE — Letter (Signed)
Summary: [facility]  [facility]   Imported By: Maryln Gottron 09/18/2009 15:56:22  _____________________________________________________________________  External Attachment:    Type:   Image     Comment:   External Document

## 2010-09-21 ENCOUNTER — Ambulatory Visit (INDEPENDENT_AMBULATORY_CARE_PROVIDER_SITE_OTHER): Payer: Managed Care, Other (non HMO) | Admitting: Internal Medicine

## 2010-09-21 ENCOUNTER — Encounter: Payer: Self-pay | Admitting: Internal Medicine

## 2010-09-21 ENCOUNTER — Encounter (INDEPENDENT_AMBULATORY_CARE_PROVIDER_SITE_OTHER): Payer: Self-pay | Admitting: *Deleted

## 2010-09-21 VITALS — BP 136/88 | HR 72 | Temp 98.7°F | Resp 14 | Ht 62.5 in | Wt 175.0 lb

## 2010-09-21 DIAGNOSIS — Z0000 Encounter for general adult medical examination without abnormal findings: Secondary | ICD-10-CM

## 2010-09-21 DIAGNOSIS — E039 Hypothyroidism, unspecified: Secondary | ICD-10-CM

## 2010-09-21 DIAGNOSIS — K2970 Gastritis, unspecified, without bleeding: Secondary | ICD-10-CM

## 2010-09-21 DIAGNOSIS — Z9109 Other allergy status, other than to drugs and biological substances: Secondary | ICD-10-CM

## 2010-09-21 DIAGNOSIS — J309 Allergic rhinitis, unspecified: Secondary | ICD-10-CM

## 2010-09-21 DIAGNOSIS — D6489 Other specified anemias: Secondary | ICD-10-CM

## 2010-09-21 DIAGNOSIS — E785 Hyperlipidemia, unspecified: Secondary | ICD-10-CM

## 2010-09-21 DIAGNOSIS — K2990 Gastroduodenitis, unspecified, without bleeding: Secondary | ICD-10-CM

## 2010-09-21 DIAGNOSIS — I10 Essential (primary) hypertension: Secondary | ICD-10-CM

## 2010-09-21 LAB — POCT URINALYSIS DIPSTICK
Bilirubin, UA: NEGATIVE
Glucose, UA: NEGATIVE
Leukocytes, UA: NEGATIVE
Nitrite, UA: NEGATIVE
Urobilinogen, UA: 0.2
pH, UA: 6

## 2010-09-21 LAB — T3, FREE: T3, Free: 3 pg/mL (ref 2.3–4.2)

## 2010-09-21 MED ORDER — LEVOTHYROXINE SODIUM 150 MCG PO TABS: 150.0000 ug | ORAL_TABLET | Freq: Every day | ORAL | Status: DC

## 2010-09-21 MED ORDER — OMEPRAZOLE 20 MG PO CPDR: 20.0000 mg | DELAYED_RELEASE_CAPSULE | Freq: Every day | ORAL | Status: AC

## 2010-09-21 NOTE — Progress Notes (Signed)
Subjective:    Patient ID: Alexandra [name], female    DOB: November 25, 1960, 50 y.o.   MRN: [id number]  HPI   patient is a 50 year old  African American female who presents for followup of hyperthyroidism and a history of resolved diabetes and hypertension status post bariatric surgery.  She has intermittent gastritis and has been treated before for gastritis with anemia we spent a great of time explaining to her the relationship between ulcers it can occur at the anastomosis and her surgical stomach and the need to continue a proton pump inhibitor lifelong.   She's been appropriate vitamin therapy we will monitor her thyroid she will have a complete physical examination in August which will look at all of her vitamin levels she appears well and feels well  Review of Systems  Constitutional: Negative for activity change, appetite change and fatigue.  HENT: Negative for ear pain, congestion, neck pain, postnasal drip and sinus pressure.   Eyes: Negative for redness and visual disturbance.  Respiratory: Negative for cough, shortness of breath and wheezing.   Gastrointestinal: Negative for abdominal pain and abdominal distention.  Genitourinary: Negative for dysuria, frequency and menstrual problem.  Musculoskeletal: Negative for myalgias, joint swelling and arthralgias.  Skin: Negative for rash and wound.  Neurological: Negative for dizziness, weakness and headaches.  Hematological: Negative for adenopathy. Does not bruise/bleed easily.  Psychiatric/Behavioral: Negative for sleep disturbance and decreased concentration.   Past Medical History  Diagnosis Date  . Diabetes mellitus   . Chronic back pain   . Hypertension   . Thyroid disease   . Hyperlipidemia    Past Surgical History  Procedure Date  . Cholecystectomy   . Abdominal hysterectomy   . Bariatric surgery   . Back surgery   . Tubal ligation   . Cardiac catheterization     reports that she has never smoked. She does not have  any smokeless tobacco history on file. She reports that she does not drink alcohol or use illicit drugs. family history is not on file.        Objective:   Physical Exam  Constitutional: She is oriented to person, place, and time. She appears well-developed and well-nourished. No distress.  HENT:  Head: Normocephalic and atraumatic.  Right Ear: External ear normal.  Left Ear: External ear normal.  Nose: Nose normal.  Mouth/Throat: Oropharynx is clear and moist.  Eyes: Conjunctivae and EOM are normal. Pupils are equal, round, and reactive to light.  Neck: Normal range of motion. Neck supple. No JVD present. No tracheal deviation present. No thyromegaly present.  Cardiovascular: Normal rate, regular rhythm, normal heart sounds and intact distal pulses.   No murmur heard. Pulmonary/Chest: Effort normal and breath sounds normal. She has no wheezes. She exhibits no tenderness.  Abdominal: Soft. Bowel sounds are normal.  Musculoskeletal: Normal range of motion. She exhibits no edema and no tenderness.  Lymphadenopathy:    She has no cervical adenopathy.  Neurological: She is alert and oriented to person, place, and time. She has normal reflexes. No cranial nerve deficit.  Skin: Skin is warm and dry. She is not diaphoretic.  Psychiatric: She has a normal mood and affect. Her behavior is normal.          Assessment & Plan:   she is placed on Prilosec 20 mg by mouth daily for gastric prophylaxis her thyroid levels were checked today and we will call her with results of those she schedule for complete physical examination in 6 months time.  problem focused exam

## 2010-09-21 NOTE — Assessment & Plan Note (Signed)
Apparent apparent misunderstanding about her risk for ulcers given her gastric bypass surgery she has intermittent gastritis pain which could represent represent an ulcer at the anastomosis given the fact that she also has a history of intermittent anemia we recommend that she remain on a proton pump inhibitor such as Nexium or Prilosec on a daily basis

## 2010-09-21 NOTE — Assessment & Plan Note (Signed)
Early on 150 mg of Synthroid we'll monitor her TSH T3 and T4 today

## 2010-09-25 NOTE — Letter (Signed)
Summary: [facility] Instructions  [facility]  [address] [place], [place] [address]   Phone: [phone]  Fax: [phone]       Alexandra [name]    Nov 26, 1960    MRN: [id number]        Procedure Day /Date: Friday 09-28-10     Arrival Time: 10:30 am     Procedure Time: 11:30 am     Location of Procedure:                    _x _  [facility] (4th Floor)                        PREPARATION FOR COLONOSCOPY WITH MOVIPREP   Starting 5 days prior to your procedure  09-24-10  do not eat nuts, seeds, popcorn, corn, beans, peas,  salads, or any raw vegetables.  Do not take any fiber supplements (e.g. Metamucil, Citrucel, and Benefiber).  THE DAY BEFORE YOUR PROCEDURE         DATE:  09-27-10  DAY: Thursday   1.  Drink clear liquids the entire day-NO SOLID FOOD  2.  Do not drink anything colored red or purple.  Avoid juices with pulp.  No orange juice.  3.  Drink at least 64 oz. (8 glasses) of fluid/clear liquids during the day to prevent dehydration and help the prep work efficiently.  CLEAR LIQUIDS INCLUDE: Water Jello Ice Popsicles Tea (sugar ok, no milk/cream) Powdered fruit flavored drinks Coffee (sugar ok, no milk/cream) Gatorade Juice: apple, white grape, white cranberry  Lemonade Clear bullion, consomm, broth Carbonated beverages (any kind) Strained chicken noodle soup Hard Candy                             4.  In the morning, mix first dose of MoviPrep solution:    Empty 1 Pouch A and 1 Pouch B into the disposable container    Add lukewarm drinking water to the top line of the container. Mix to dissolve    Refrigerate (mixed solution should be used within 24 hrs)  5.  Begin drinking the prep at 5:00 p.m. The MoviPrep container is divided by 4 marks.   Every 15 minutes drink the solution down to the next mark (approximately 8 oz) until the full liter is complete.   6.  Follow completed prep with 16 oz of clear liquid of your choice  (Nothing red or purple).  Continue to drink clear liquids until bedtime.  7.  Before going to bed, mix second dose of MoviPrep solution:    Empty 1 Pouch A and 1 Pouch B into the disposable container    Add lukewarm drinking water to the top line of the container. Mix to dissolve    Refrigerate  THE DAY OF YOUR PROCEDURE      DATE:  09-28-10  DAY: Friday  Beginning at  6:30 a.m. (5 hours before procedure):         1. Every 15 minutes, drink the solution down to the next mark (approx 8 oz) until the full liter is complete.  2. Follow completed prep with 16 oz. of clear liquid of your choice.    3. You may drink clear liquids until  9:30 a.m. (2 HOURS BEFORE PROCEDURE).   MEDICATION INSTRUCTIONS  Unless otherwise instructed, you should take regular prescription medications with a small sip of water  as early as possible the morning of your procedure.           OTHER INSTRUCTIONS  You will need a responsible adult at least 51 years of age to accompany you and drive you home.   This person must remain in the waiting room during your procedure.  Wear loose fitting clothing that is easily removed.  Leave jewelry and other valuables at home.  However, you may wish to bring a book to read or  an iPod/MP3 player to listen to music as you wait for your procedure to start.  Remove all body piercing jewelry and leave at home.  Total time from sign-in until discharge is approximately 2-3 hours.  You should go home directly after your procedure and rest.  You can resume normal activities the  day after your procedure.  The day of your procedure you should not:   Drive   Make legal decisions   Operate machinery   Drink alcohol   Return to work  You will receive specific instructions about eating, activities and medications before you leave.    The above instructions have been reviewed and explained to me by   Sherren Kerns RN  September 21, 2010 8:52 AM     I fully  understand and can verbalize these instructions _____________________________ Date _________

## 2010-09-25 NOTE — Miscellaneous (Signed)
Summary: previsit prep/rm  Clinical Lists Changes  Medications: Added new medication of MOVIPREP 100 GM  SOLR (PEG-KCL-NACL-NASULF-NA ASC-C) As per prep instructions. - Signed Rx of MOVIPREP 100 GM  SOLR (PEG-KCL-NACL-NASULF-NA ASC-C) As per prep instructions.;  #1 x 0;  Signed;  Entered by: Sherren Kerns RN;  Authorized by: Hart Carwin MD;  Method used: Electronically to [facility]  [address] [id number] [address], [place], [place]  [address], Ph: [phone] or [phone], Fax: [phone] Allergies: Added new allergy or adverse reaction of ACE INHIBITORS Added new allergy or adverse reaction of * SHRIMP Observations: Added new observation of ALLERGY REV: Done (09/21/2010 8:34)    Prescriptions: MOVIPREP 100 GM  SOLR (PEG-KCL-NACL-NASULF-NA ASC-C) As per prep instructions.  #1 x 0   Entered by:   Sherren Kerns RN   Authorized by:   Hart Carwin MD   Signed by:   Sherren Kerns RN on 09/21/2010   Method used:   Electronically to        [facility]  [address] [id number] ([facility]ail)       [address]       [place], [place]  [address]       Ph: [phone] or [phone]       Fax: [phone]   RxID:   [id number]

## 2010-09-28 ENCOUNTER — Other Ambulatory Visit: Payer: Self-pay | Admitting: Internal Medicine

## 2010-09-28 ENCOUNTER — Other Ambulatory Visit (AMBULATORY_SURGERY_CENTER): Payer: Managed Care, Other (non HMO) | Admitting: Internal Medicine

## 2010-09-28 DIAGNOSIS — K621 Rectal polyp: Secondary | ICD-10-CM

## 2010-09-28 DIAGNOSIS — Z1211 Encounter for screening for malignant neoplasm of colon: Secondary | ICD-10-CM

## 2010-09-28 DIAGNOSIS — D126 Benign neoplasm of colon, unspecified: Secondary | ICD-10-CM

## 2010-09-28 DIAGNOSIS — K620 Anal polyp: Secondary | ICD-10-CM

## 2010-10-02 ENCOUNTER — Encounter: Payer: Self-pay | Admitting: Internal Medicine

## 2010-10-04 NOTE — Procedures (Addendum)
Summary: Colonoscopy  Patient: Alexandra [name] Note: All result statuses are Final unless otherwise noted.  Tests: (1) Colonoscopy (COL)   COL Colonoscopy           DONE     [facility]     [address]     [place], [place]  [address]          COLONOSCOPY PROCEDURE REPORT          PATIENT:  Alexandra [name], Alexandra [name]  MR#:  [id number]     BIRTHDATE:  07/31/1960, 49 yrs. old  GENDER:  female     ENDOSCOPIST:  Hedwig Morton. Juanda Chance, MD     REF. BY:  Stacie Glaze, M.D.     PROCEDURE DATE:  09/28/2010     PROCEDURE:  Colonoscopy 19147     ASA CLASS:  Class II     INDICATIONS:  Routine Risk Screening     MEDICATIONS:   Versed 10 mg, Fentanyl 100 mcg, Benadryl 12.5 mg          DESCRIPTION OF PROCEDURE:   After the risks benefits and     alternatives of the procedure were thoroughly explained, informed     consent was obtained.  Digital rectal exam was performed and     revealed no rectal masses.   The LB PCF-H180AL [id number] endoscope     was introduced through the anus and advanced to the cecum, which     was identified by both the appendix and ileocecal valve, without     limitations.  The quality of the prep was good, using MiraLax.     The instrument was then slowly withdrawn as the colon was fully     examined.     <<PROCEDUREIMAGES>>          FINDINGS:  Three polyps were found. 1-3 mm polyps x 3 rectum and     sigmoid colon The polyps were removed using cold biopsy forceps     (see image1, image7, and image5).  This was otherwise a normal     examination of the colon (see image2, image3, image4, and image6).     Retroflexed views in the rectum revealed no abnormalities.    The     scope was then withdrawn from the patient and the procedure     completed.          COMPLICATIONS:  None     ENDOSCOPIC IMPRESSION:     1) Three polyps     2) Otherwise normal examination     3 diminutive polyps left colon removed     RECOMMENDATIONS:     1) Await pathology results     2) High  fiber diet.     REPEAT EXAM:  In 10 year(s) for.          ______________________________     Hedwig Morton. Juanda Chance, MD          CC:          n.     eSIGNED:   Hedwig Morton. Takaya Hyslop at 09/28/2010 12:50 PM          [name], [id number]  Note: An exclamation mark (!) indicates a result that was not dispersed into the flowsheet. Document Creation Date: 09/28/2010 12:51 PM _______________________________________________________________________  (1) Order result status: Final Collection or observation date-time: 09/28/2010 12:43 Requested date-time:  Receipt date-time:  Reported date-time:  Referring Physician:   Ordering Physician: Lina Sar [id number]) Specimen Source:  Source: Launa Grill  Order Number: [id number] Lab site:   Appended Document: Colonoscopy recall colon 10 years  Appended Document: Colonoscopy     Procedures Next Due Date:    Colonoscopy: 09/2020

## 2010-10-09 NOTE — Letter (Signed)
Summary: Patient Notice- Polyp Results  [facility]  [address] [place], [place] [address]   Phone: [phone]  Fax: [phone]        October 02, 2010 MRN: [id number]    MERRISSA GIACOBBE [address] [place], [place]  [address]    Dear Ms. Azure,  I am pleased to inform you that the colon polyp(s) removed during your recent colonoscopy was (were) found to be benign (no cancer detected) upon pathologic examination.Both polyps were hyperplastic ( not precancerous)  I recommend you have a repeat colonoscopy examination in 10_ years to look for recurrent polyps, as having colon polyps increases your risk for having recurrent polyps or even colon cancer in the future.  Should you develop new or worsening symptoms of abdominal pain, bowel habit changes or bleeding from the rectum or bowels, please schedule an evaluation with either your primary care physician or with me.  Additional information/recommendations:  _x_ No further action with gastroenterology is needed at this time. Please      follow-up with your primary care physician for your other healthcare      needs.  __ Please call [phone] to schedule a return visit to review your      situation.  __ Please keep your follow-up visit as already scheduled.  __ Continue treatment plan as outlined the day of your exam.  Please call us if you are having persistent problems or have questions about your condition that have not been fully answered at this time.  Sincerely,  Hart Carwin MD  This letter has been electronically signed by your physician.  Appended Document: Patient Notice- Polyp Results letter mailed

## 2010-10-14 LAB — CBC
HCT: 38.4 % (ref 36.0–46.0)
Hemoglobin: 13.2 g/dL (ref 12.0–15.0)
MCHC: 34.3 g/dL (ref 30.0–36.0)
MCV: 91.5 fL (ref 78.0–100.0)
Platelets: 325 10*3/uL (ref 150–400)
RBC: 4.2 MIL/uL (ref 3.87–5.11)
RDW: 13.9 % (ref 11.5–15.5)
WBC: 6.4 10*3/uL (ref 4.0–10.5)

## 2010-10-14 LAB — BASIC METABOLIC PANEL
BUN: 10 mg/dL (ref 6–23)
CO2: 33 mEq/L — ABNORMAL HIGH (ref 19–32)
Calcium: 9 mg/dL (ref 8.4–10.5)
Chloride: 106 mEq/L (ref 96–112)
Creatinine, Ser: 0.55 mg/dL (ref 0.4–1.2)
GFR calc Af Amer: >60 mL/min (ref >60)
GFR calc non Af Amer: >60 mL/min (ref >60)
Glucose, Bld: 96 mg/dL (ref 70–99)
Potassium: 3.7 mEq/L (ref 3.5–5.1)
Sodium: 143 mEq/L (ref 135–145)

## 2010-11-04 LAB — BASIC METABOLIC PANEL
BUN: 18 mg/dL (ref 6–23)
CO2: 31 mEq/L (ref 19–32)
Chloride: 106 mEq/L (ref 96–112)
GFR calc non Af Amer: >60 mL/min (ref >60)
Glucose, Bld: 89 mg/dL (ref 70–99)
Potassium: 5.1 mEq/L (ref 3.5–5.1)

## 2010-11-04 LAB — POCT TOXICOLOGY PANEL

## 2010-11-04 LAB — DIFFERENTIAL
Basophils Absolute: 0 10*3/uL (ref 0.0–0.1)
Basophils Relative: 1 % (ref 0–1)
Eosinophils Absolute: 0.1 10*3/uL (ref 0.0–0.7)
Eosinophils Relative: 2 % (ref 0–5)
Monocytes Absolute: 0.5 10*3/uL (ref 0.1–1.0)

## 2010-11-04 LAB — POCT CARDIAC MARKERS
CKMB, poc: <1 ng/mL — ABNORMAL LOW (ref 1.0–8.0)
CKMB, poc: <1 ng/mL — ABNORMAL LOW (ref 1.0–8.0)
Myoglobin, poc: 22.2 ng/mL (ref 12–200)
Troponin i, poc: <0.05 ng/mL (ref 0.00–0.09)

## 2010-11-04 LAB — CBC
HCT: 36.8 % (ref 36.0–46.0)
Hemoglobin: 12.6 g/dL (ref 12.0–15.0)
MCHC: 34.3 g/dL (ref 30.0–36.0)
MCV: 92 fL (ref 78.0–100.0)
Platelets: 309 10*3/uL (ref 150–400)
RDW: 12.8 % (ref 11.5–15.5)

## 2010-11-04 LAB — URINALYSIS, ROUTINE W REFLEX MICROSCOPIC
Bilirubin Urine: NEGATIVE
Hgb urine dipstick: NEGATIVE
Ketones, ur: NEGATIVE mg/dL
Nitrite: NEGATIVE
Urobilinogen, UA: 1 mg/dL (ref 0.0–1.0)

## 2010-11-04 LAB — PREGNANCY, URINE: Preg Test, Ur: NEGATIVE

## 2010-11-04 LAB — D-DIMER, QUANTITATIVE: D-Dimer, Quant: 0.38 ug/mL-FEU (ref 0.00–0.48)

## 2010-11-12 LAB — POCT RAPID STREP A (OFFICE): Streptococcus, Group A Screen (Direct): NEGATIVE

## 2010-12-11 NOTE — Op Note (Signed)
[name], [name]              ACCOUNT NO.:  [id number]   MEDICAL RECORD NO.:  [id number]          PATIENT TYPE:  INP   LOCATION:  1222                         FACILITY:  [facility]   PHYSICIAN:  Alexandra Park. Daphine Deutscher, MD  DATE OF BIRTH:  1961/07/24   DATE OF PROCEDURE:  05/19/2007  DATE OF DISCHARGE:  05/01/2007                               OPERATIVE REPORT   PREOPERATIVE DIAGNOSIS:  Morbid obesity and diabetes mellitus, also  ventral umbilical hernia.   PREMEDICATION:  Alexandra [name] is a 50 year old African American female  with the following co-morbidities:  1. Diabetes mellitus.  2. Sleep apnea.  3. Hypertension.  4. Hypercholesterolemia.  5. Arthritis in her knees.  6. Status post spinal fusion.   SURGEON:  Alexandra Park. Daphine Deutscher, MD   ASSISTANT:  Alexandra [name]. Hoxworth, M.D. until the bladder portion of  creation of the gastric pouch then he was relieved by Dr. Alfonse [name].   PROCEDURE:  Laparoscopic enterolysis and takedown of incarcerated small  bowel from umbilical hernia (more of an incisional hernia that appeared  to have probably had a fascial dehiscence as small bowel and colon down  below were densely adherent to this lower midline incision).  I was  unable to free up the colon but did take down the loops that were stuck  up in this incarcerated umbilical level hernia and some subsequently  repaired it with transverse sutures of #1 Novofil interrupted  (approximately five).   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 Colin [name] and subsequently by Dr. Daphine [name],  which was unsuccessful   DESCRIPTION OF PROCEDURE:  Alexandra [name] was taken to OR 1 in the  afternoon of May 19, 2007.  She was given general anesthesia.  The  abdomen was prepped widely with Techni-Care and draped sterilely.  I  entered the abdomen through the left upper quadrant using the [employer] Fios system 10 mm zero degree scope  and the tip of this appeared  to penetrate into the small bowel mesentery leading to some hemorrhage  but insufflated the abdomen and then the entire trocar was then passed  into the abdomen.  The standard trocar placements were used and I spent  the first hour of the case trying to take down the small intestine that  was densely fused to the umbilicus.  There was a hernia there slightly  to the left of the umbilicus and these are small Richter's type hernias  that included bowel loops.  With sharp dissection I meticulously took  these down and did not create any enterotomies but allowed the bowel to  fall away which was enough to allow for Roux--Y.   I then identified the ligament of Treitz and about 5 cm distal to that  identified the site where the tip of the Fios had entered the mesentery  approximately 2 cm away from the bowel and there was a little bit of  clot there but no active bleeding.  I measured down 40 cm and this is  where I divided  the small bowel and put a Penrose drain on the Roux-Y  end and then created a side-to-side anastomosis.  In handling this Dr.  Johna [name] noted that the bowel was very friable and had serosal tears  there at the jejunojejunostomy which I subsequently repaired with  interrupted simple sutures of 2-0 Vicryl.  There was no evidence of full  thickness injury.  In the meantime I opened the bowel on the  antimesenteric border fired the Covidien 40 mm white load and closed the  common defect with two 2-0 Vicryl and got a good nice secure closure of  that.  Tisseel was subsequently applied to that.   The mesenteric defect was closed with running 2-0 silk.   There was another little area along the Roux where I appeared to have  had some injury just holding it and measuring it with the Alexandra [name] and I  oversewed this with a single suture of 2-0 Vicryl.  Again there was no  evidence of a full thickness perforation.  These were more serosal  injuries.   I  placed the Alexandra [name] retractor in the upper abdomen and then created a  window up on the left side of the stomach then came down about 4-5 cm  and began going across the stomach with dissection and behind the  stomach and then I was able to apply the stapler and fire across.  Nothing was in the mouth during these applications and then with three  subsequent firings a small pouch was created.  There was some bleeding  along the gastric remnant which I controlled with a Harmonic and with  clips placed.  Other oozing was controlled with a Harmonic.  I  subsequently came back to that after the Roux was done to check that  area and tidy it up.  Tisseel was then applied to the proximal portion  of the divided stomach.  The Roux limb was then brought up with a candy  cane going left and was sutured along the antimesenteric border with  running 2-0 Vicryl using a lap or tie on either end, actually on the  right side and was tied on the left side.  With the back wall being  established I then opened the stomach against an Alexandra [name] tube that had  been placed by anesthesia and then opened the small intestine.  The  Covidien 45+ stapler with a blue load was then used to create the  anastomosis.  The common defect was then closed with 2-0 Vicryl in a  running from either running closure from either end and then a second  layer was closed with a free suture using 2-0 Vicryl.  The Alexandra [name] tube  was then removed.  At that point it was noted that the patient was  bucking and pressure went up really high as we removed that.  Dr. Colin [name]  went up to the top and attempted to endoscope the patient but kept going  into the false cords and I went up and attempted to look around and I  could not get this to go down the esophagus and chose not to do this and  would get a swallow.  The anastomosis however looked really good and  there was no evidence of any leak.  A drain was placed up in the upper  abdomen and brought  out through the left side 5 mm port.  Then I took  about repairing this umbilical hernia as best I could since the colon  was fused to just below this little hernia.  I went ahead and cut down  through a transverse incision below the umbilicus and there was a lot of  scar tissue there.  I freed up these two little hernias that were left  of midline and then closed the entire defect with interrupted #1 Novofil  in a transverse manner.  I then went back in with a scope and inspected  everything to make sure that no bowel was involved and everything looked  to be intact and appropriate.   No other abnormalities were noted.  No evidence of bleeding or any  enterotomies.  I then deflated the abdomen and closed the wounds with  staples.  The patient tolerated the procedure well and was taken to the  recovery room in satisfactory condition prior to transferring to the ICU  step-down because of her diabetes and her sleep apnea.      Alexandra Park Daphine Deutscher, MD  Electronically Signed    MBM/MEDQ  D:  05/19/2007  T:  05/20/2007  Job:  [id number]   cc:   Stacie Glaze, MD  [address] [place]  [place] [address]

## 2010-12-14 NOTE — H&P (Signed)
[facility]  Patient:    Alexandra [name], Alexandra [name]                     MRN: [id number] Adm. Date:  [id number] Disc. Date: [id number] Attending:  Wandalee Ferdinand                         History and Physical  HISTORY OF PRESENT ILLNESS:   This is a 50 year old gravida 6, para 4, abortus 2, with known polycystic ovary disease, who underwent diagnostic/operative laparoscopy and right salpingo-oophorectomy October 2001.  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  At the time of the laparoscopy, the left ovary appeared to have the same characteristics as the right ovary except smaller in size.  The patient returned for repeat ultrasound after the diagnostic laparoscopy. The ultrasound revealed multiple ovarian cysts on the left side with the overall dimensions of the ovary being 5 x 3 x 3 cm.  At the time of that presentation, the patient states she had been having significant left lower quadrant discomfort for several years duration.  It had been previously overshadowed by the right lower quadrant discomfort that she had prior to her right salpingo-oophorectomy.  However, since that time, she has noted almost exclusively left lower quadrant discomfort and feels that she would like to proceed with definitive therapy in the form of hysterectomy and left salpingo-oophorectomy for the aforementioned reasons.  MEDICATIONS:                  1. Synthroid 0.175 mg per day.                               2. Prinivil.                               3. Hydrochlorothiazide.  PAST MEDICAL HISTORY:         1. Chronic hypertension.                               2. Hypothyroidism.  PAST SURGICAL HISTORY:        1. Thyroidectomy.                               2. Cholecystectomy.                               3. Diagnostic laparoscopy x 3, in 1983 through                                  1986.                               4. Back surgery in 1998.                           5. Hysteroscopy and endometrial biopsy in 1996.  6. Bilateral tubal sterilization procedure with                                  repeat procedure per Dr. Altamese Dilling.                               7. Diagnostic/operative laparoscopy with right                                  salpingo-oophorectomy October 2001.  ALLERGIES:                    TYLOX.  FAMILY HISTORY:               Noncontributory.  SOCIAL HISTORY:               The patient denies use of tobacco or significant alcohol.  REVIEW OF SYSTEMS:            Noncontributory.  PHYSICAL EXAMINATION:  GENERAL:                      Well-developed, well-nourished, obese, pleasant black female, in no acute distress.  VITAL SIGNS:                  Afebrile.  Vital signs stable.  SKIN:                         Warm and dry, without lesions.  LYMPH:                        No supraclavicular, cervical, or inguinal adenopathy.  HEENT:                        Normocephalic.  NECK:                         Supple.  Without thyromegaly.  CHEST:                        Clear.  LUNGS:                        Clear.  CARDIAC:                      Regular rate and rhythm.  Without murmurs, rubs, or gallops.  BREASTS:                      Deferred.  ABDOMEN:                      Soft and nontender.  Without masses or organomegaly.  Bowel sounds are active.  MUSCULOSKELETAL:              Full range of motion without edema, cyanosis, or CVA tenderness.  PELVIC:                       Deferred until examination under anesthesia.  IMPRESSION:                   1. Polycystic ovary disease.  2. History of right serous cystoadenoma of the                                  ovary with left ovary noted to be similar in                                  appearance but smaller in size at the time of                                  operative laparoscopy.                                3. Left lower quadrant discomfort, possibly                                  secondary to #2.                               4. Hypothyroidism.                               5. Hypertension.  PLAN:                         Laparoscopic-assisted vaginal hysterectomy with left salpingo-oophorectomy.  The risks, benefits, complications, and alternatives were discussed with the patient.  The possible need for exploratory laparotomy discussed and accepted.  The need for hormone replacement therapy post bilateral adnexectomy discussed and accepted. Possible need for transfusion discussed and accepted.  Questions invited and answered. DD:  06/25/00 TD:  06/25/00 Job: [id number]

## 2010-12-14 NOTE — Discharge Summary (Signed)
[facility]  Patient:    Alexandra [name], Alexandra [name]                     MRN: [id number] Adm. Date:  [id number] Disc. Date: [id number] Attending:  Wandalee Ferdinand                           Discharge Summary  DISCHARGE DIAGNOSES:          1. Polycystic ovary disease.                               2. History of right serocyst adenoma.                               3. Left-sided serocyst adenoma.  CONSULTATIONS:                None.  DISCHARGE MEDICATIONS:        Analgesics.  HISTORY AND PHYSICAL:         This is a 50 year old gravida 6, para 4, AB 2 with known polycystic ovary disease who underwent diagnostic/operative laparoscopy and right salpingo-oophorectomy on October of 2001.  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 At the time of the laparoscopy, the left ovary appeared to have the same characteristics of the right except small in size.  For the remainder of the past medical history and physical examination, please see chart.  The patient was hence admitted for laparoscopic assisted vaginal hysterectomy with left salpingo-oophorectomy.  HOSPITAL COURSE:              The patient was admitted to [facility].  Admission laboratory studies were drawn.  On June 25, 2000, she was taken to the operating room and underwent diagnostic laparoscopy.  In the context of that procedure, it became apparent that the laparoscopic assisted vaginal hysterectomy could not be successfully accomplished.  Hence, the patient underwent total abdominal hysterectomy with left salpingo-oophorectomy.  The intraoperative course was unremarkable.  The patients postoperative course was unremarkable.  She had a Jackson-Pratt drain which was removed on June 27, 2000.  She was felt to be stable for discharge on June 27, 2000, and was discharged home in satisfactory condition.  ACCESSORY CLINICAL FINDINGS:  Hemoglobin and hematocrit on  admission were 11.0 and 33.4, respectively.  Repeat values obtained June 27, 2000, were 7.6 and 23.1, respectively.  The patient was discharged home on iron and close follow-up arranged at the office in order to document no further diminution of her hemoglobin and hematocrit. DD:  08/19/00 TD:  08/19/00 Job: [id number]

## 2010-12-14 NOTE — Discharge Summary (Signed)
[facility]  Patient:    Alexandra [name], Alexandra [name]                       MRN: [id number] Adm. Date:  05/09/00 Disc. Date: 05/10/00 Attending:  Fayrene Fearing A. Ashley Royalty, M.D.                           Discharge Summary  DISCHARGE DIAGNOSIS:  Right ovarian cyst.  Pathology pending.  OPERATION AND SPECIAL PROCEDURES: 1. Diagnostic/operative laparoscopy. 2. Left ovarian biopsy. 3. Exploratory laparotomy (mini) with right salpingo-oophorectomy.  CONSULTATIONS:  None.  DISCHARGE MEDICATIONS: 1. Chromagen 2. Tylenol #3.  HOSPITAL COURSE:  The patient was brought in on May 08, 2000, for the aforementioned surgical procedures.  Due to the fact that a minilaparotomy was performed she was admitted to 23 hour observation postoperatively.  On May 09, 2000, she complained of sufficient incisional discomfort late in the day that she was kept overnight and discharged May 10, 2000, afebrile and in satisfactory condition.  LABORATORY DATA AND MEDICAL FINDINGS:  Hemoglobin and hematocrit on admission was 10.1 and 31.3 respectively.  Repeat values were obtained on May 09, 2000, and revealed a hemoglobin of 8.6, hematocrit 25.9 respectively.  Type and screen revealed O+ blood.  Routine chemistries were within normal limits.  DISPOSITION:  The patient will return in 2 weeks for postoperative appointment. She is also to return on May 12, 2000 for CBC in the office. D:  05/10/00 TD:  05/10/00 Job: [id number]

## 2010-12-14 NOTE — Discharge Summary (Signed)
Alexandra [name], Alexandra [name]              ACCOUNT NO.:  [id number]   MEDICAL RECORD NO.:  [id number]          PATIENT TYPE:  INP   LOCATION:  2007                         FACILITY:  [facility]   PHYSICIAN:  Raenette Rover. Felicity Coyer, MDDATE OF BIRTH:  31-Dec-1960   DATE OF ADMISSION:  03/30/2006  DATE OF DISCHARGE:  04/01/2006                                 DISCHARGE SUMMARY   DISCHARGE DIAGNOSES:  1. Syncope/chest pain.  2. Hypothyroid with depressed TSH.  3. Diabetes type 2.   HISTORY OF PRESENT ILLNESS:  Alexandra [name] is a 50 year old African-American  female admitted on March 30, 2006 with a chief complaint of chest pain  and syncope. She apparently experienced a syncopal episode around noon on  March 29, 2006 at which time she states she was getting from her bathroom  to walk to her kitchen. At that time, she felt a mild pressure sensation in  the center of her chest which lasted several minutes and was associated with  shortness of breath. The patient then passed out according to the husband at  which time he called EMS. She was admitted for further evaluation and  treatment.   PAST MEDICAL HISTORY:  1. Hypertension.  2. Type 2 diabetes.  3. Dyslipidemia.  4. Hypothyroidism.   HOSPITAL COURSE:  #1.  Chest pain/syncope. The patient was admitted and was  evaluated by cardiology.  She underwent serial cardiac enzymes which were  negative. Due to multiple risk factors, the patient underwent a cardiac  catheterization which was clean. It was felt at the time of discharge that  syncope may have been secondary to vasovagal; however, the cardiology team  wishes for the patient to have an event monitor performed.  #2.  Hypothyroid. The patient was noted to have a depressed TSH. As a  result, her Cytomel has been decreased from 50 mcg daily to 25 mcg daily.   PERTINENT LABORATORY DATA:  At discharge, BUN 8, creatinine 0.6, hemoglobin  A1c 6.7.  TSH 0.014.   MEDICATIONS AT DISCHARGE.:  1. The  patient instructed to hold Glucophage for 48 hours after cardiac      catheterization.  2. Lipitor 20 mg p.o. daily.  3. Levothyroxine 100 mcg p.o. daily.  4. Cytomel 25 mcg p.o. daily.  5. Micardis/HCTZ 40 mg/12.5 mg 1 tablet p.o. daily.   FOLLOW UP:  The patient is instructed to follow up with Dr. Gala Romney on  October 9 at 9:30 a.m. She is also instructed to follow up with Dr.  Prescott Gum PA and September 13 at 9:15 a.m. for a groin check. She is  instructed to follow up with Dr. Lovell Sheehan in 1-2 weeks and she will be  contacted by [facility] for a 30 day event monitor.     ______________________________  Sandford Craze, PA      Raenette Rover. Felicity Coyer, MD  Electronically Signed    MO/MEDQ  D:  04/01/2006  T:  04/01/2006  Job:  [id number]   cc:   Stacie Glaze, MD

## 2010-12-14 NOTE — Op Note (Signed)
NAMEDAMYA, COMLEY              ACCOUNT NO.:  [id number]   MEDICAL RECORD NO.:  [id number]          PATIENT TYPE:  AMB   LOCATION:  DSC                          FACILITY:  [facility]   PHYSICIAN:  Katy Fitch. Sypher, M.D. DATE OF BIRTH:  Dec 31, 1960   DATE OF PROCEDURE:  01/02/2006  DATE OF DISCHARGE:                                 OPERATIVE REPORT   PREOPERATIVE DIAGNOSIS:  Chronic stage II impingement, left shoulder, with  MRI proven AC arthropathy and tendinopathy of the subscapularis and  supraspinatus tendons.   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   OPERATION:  1.  Diagnostic arthroscopy, left glenohumeral joint.  2.  Arthroscopic debridement of labrum and removal of calcium deposits from      anterior glenohumeral ligaments.  3.  Arthroscopic subacromial decompression with bursectomy, coracoacromial      ligament relaxation and acromioplasty.  4.  Arthroscopic distal clavicle resection.   OPERATING SURGEON:  Josephine Igo, M.D.   ASSISTANT:  Annye Rusk, P.A.-C.   ANESTHESIA:  Is general endotracheal supplemented by interscalene block,  supervising anesthesiologist is Dr. Gelene Mink.   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   She had a prior history of evaluation and management of her right shoulder  by Dr. Rennis Chris with identification of [facility] arthropathy and chronic impingement.  On May 31, 2004, Dr. Rennis Chris performed an arthroscopic debridement of the  right shoulder, subacromial decompression, lysis of capsular  adhesions and  distal clavicle resection.  Ms. Nanez ultimately rehabilitated her shoulder  well.   She developed similar pain on the left and elected to seek an alternative  upper extremity orthopedic opinion.   Clinical examination revealed evidence of impingement and tendinopathy of  the rotator cuff.  A MRI of the shoulder was obtained and documented no  signs of retracted full-thickness rotator cuff tear.  However, Ms. Torrez was  noted to have tendinopathy of the subscapularis tendon, supraspinatus  tendon, AC arthropathy and an unfavorable anterior lateral acromial  morphology.   After informed consent, Ms. Bahner was brought to the operating at this time  an effort to relieve her pain, anticipating debridement of her bursa,  subacromial decompression and distal clavicle resection.   We will address intra-articular pathology as identified.   PROCEDURE:  Debrah Granderson was brought to the operating room and placed in  supine position on the operating table.   Following an anesthesia consult by Dr. Gelene Mink, an interscalene block was  placed with a nerve stimulator, leading to satisfactory anesthesia of the  left arm and forequarter.   Ms. Ojo was transferred to Room 2, placed in supine position on the  operating table and, under Dr. Thornton Dales strict supervision, general  endotracheal anesthesia induced.  Ms. Dysart was carefully  positioned in the  beach-chair position with the aid of a torso and head holder designed for  shoulder arthroscopy.  The left shoulder and forequarter were prepped with  DuraPrep and draped with impervious arthroscopy drapes.  The shoulder was  then distended with 20 mL of sterile saline and a scope placed  atraumatically with a standard posterior viewing portal.  Diagnostic  arthroscopy revealed degenerative changes in the labrum from 12 o'clock  superiorly around anteriorly to 6 o'clock, and posterior arthroscopy  revealed degenerative changes in the  labrum extending back to 10 o'clock.  An anterior portal was created under direct vision followed by use of a 4.5-  mm suction shaver to debride the degenerative portions of labrum.   At about 5 o'clock anteriorly, there was significant calcific deposits  within the anterior inferior glenohumeral ligament.  This was debrided to a  smooth margin with removal of the calcium.  A fragment of labrum that was  projecting within the joint was debrided to a smooth margin.   The inferior capsular recess was normal.  The biceps origin was stable.  The  biceps tendon was normal through the rotator interval.  The subscapularis  tendon did not appear to have a retracted tear but was known to have  tendinopathy from evaluation of the MRI.   After hemostasis was achieved within the glenohumeral joint, the scope was  removed and placed in the subacromial space.  Florid bursitis was noted.  After bursectomy, the anatomy of the coracoacromial arch was studied.  The  distal clavicle was quite prominent as was the capsule of AC joint.  The  coracoacromial ligament was released with a cutting cautery and a portion of  contracted bursa anterolaterally at the site of impingement at the greater  tuberosity was also released cutting cautery.  Redundant bursa was resected  with a suction shaver followed by use of a suction bur to level the acromion  to a type 1 morphology with particular attention to removing bone along the  medial aspect of the AC joint.  The capsule of the distal clavicle was taken  down with cutting cautery and removed with a suction shaver followed by  removal of the distal 15-mm of clavicle arthroscopically using a suction  bur.  The subacromial space was then thoroughly debrided of all bony  fragments and hemostasis achieved.   After irrigation with sterile saline, the arthroscopic equipment was removed and the portals repaired with interrupted suture of 3-0 Prolene.  There were  no  apparent complications.   Ms. Lynde tolerated the surgery and anesthesia well.  She was transferred to  the recovery room with stable signs.  She will wear a sling for  approximately 24 hours until her block wears off.  We will discharge her  home to the care of her family and asked that she return to our office for  follow-up in 24 hours for dressing change and initiation of  excise program.  She is provided prescriptions for Dilaudid 2 mg 30 tablets  1 or 2 p.o. q.4-6h. p.r.n. pain, also Motrin 600 mg 30 tablets 1 p.o. q.6h.  p.r.n. pain with 1 refill and Keflex 500 mg 1 p.o. q.8h. x4 days as a  prophylactic antibiotic.      Katy Fitch Sypher, M.D.  Electronically Signed     RVS/MEDQ  D:  01/02/2006  T:  01/02/2006  Job:  [id number]

## 2010-12-14 NOTE — H&P (Signed)
[facility]  Patient:    Alexandra [name], Alexandra [name]                     MRN: [id number] Adm. Date:  [id number] Attending:  Wandalee Ferdinand                         History and Physical  HISTORY OF PRESENT ILLNESS:   The patient is a 50 year old gravida 71, para4, AB2 who presented to the office on or about December 28, 1999 for GYN evaluation. At that time, she was having some discomfort in the anterior and posterior compartments of her right leg.  She had seen Dr. Simonne Come and she came in for GYN exam to make sure that there was nothing GYN related contributing to the symptoms.  At that time, she was felt to have right adnexal fullness. Subsequent ultrasound was performed on June 1, which revealed multiple ovarian cysts in the right adnexa, the largest of which was 3.6 cm in greatest diameter.  In the left adnexa, there were also several ovarian cysts, the largest of which was 1.1 cm in greatest diameter.  The uterus was felt to be retroverted and otherwise normal.  The patient returned January 24, 2000 for repeat ultrasound.  At that time, the ultrasound of the right adnexa revealed numerous echo free cysts and the right ovary being 7.7 x 6.4 x 7.3 cm.  The left ovary contained a similar ultrasonographic appearance, with overall dimensions being 5.8 x 3.8 x 5.2 cm.  The patient presents for diagnostic and operative laparoscopy and right ovarian cystectomy for persistent ovarian cysts.  MEDICATIONS:                  Synthroid, Prinivil and hydrochlorothiazide.  PAST MEDICAL HISTORY:         Hypertension.  Hypothyroidism.  PAST SURGICAL HISTORY:        Thyroidectomy.  Cholecystectomy.  Diagnosis laparoscopy x 3 in 1983 through 1986.  Back surgery in 1998.  Hysteroscopy and endometrial biopsy in 1996.  Bilateral tubal sterilization procedure with repeat per Dr. Amedeo Kinsman.  ALLERGIES:                    TYLOX.  FAMILY HISTORY:                Noncontributory.  SOCIAL HISTORY:               The patient denies the use of tobacco or alcohol.  REVIEW OF SYSTEMS:            Noncontributory.  PHYSICAL EXAMINATION:  GENERAL:                      Well-developed, well-nourished, pleasant, obese black female in no acute distress.  VITAL SIGNS:                  Afebrile with stable vital signs.  SKIN:                         Warm and dry without lesions.  LYMPH:                        There is no supraclavicular, cervical or inguinal adenopathy.  HEENT:  Normocephalic.  NECK:                         Supple was placed thyromegaly.  CHEST:                        Clear.  CARDIAC:                      Regular rate and rhythm without murmurs, rubs, or gallops.  BREASTS:                      Deferred until examination under anesthesia.  ABDOMEN:                      Soft and nontender without masses or organomegaly.  There are numerous surgical scars.  Bowel sounds are active.  MUSCULOSKELETAL:              Full range of motion without edema, cyanosis or CVA tenderness.  PELVIC:                       Deferred until examination under anesthesia.  IMPRESSION:                   1. Persistent right adnexal mass.  Differential                                  includes benign ovarian neoplasm,                                  endometriosis, PTO, ______.  Doubt malignant                                  neoplasm.                               2. Hypertension.                               3. Hypothyroidism.                               4. Status post PTSP.  PLAN:                         Diagnostic/operative laparoscopy with right ovarian cystectomy and indicated procedures.  The risks, benefits, ______ and alternatives were fully discussed with the patient.  The possible need for unilateral salpingo-oophorectomy was discussed and accepted.  The possible need for exploratory laparotomy was discussed and  accepted.  Questions were invited and answered. DD:  05/08/00 TD:  05/08/00 Job: [id number]

## 2010-12-14 NOTE — Op Note (Signed)
[facility]  Patient:    Alexandra [name], Alexandra [name]                     MRN: [id number] Proc. Date: 05/08/00 Adm. Date:  [id number] Disc. Date: [id number] Attending:  Wandalee Ferdinand                           Operative Report  PREOPERATIVE DIAGNOSES:       1. Right adnexal mass--persistent--differential                                  includes benign ovarian neoplasm,                                  endometriosis, PCO, physiologic.  Doubt                                  malignant neoplasm.                               2. Hypertension.                               3. Hypothyroid.  POSTOPERATIVE DIAGNOSES:      1. Right adnexal mass--persistent--differential                                  includes benign ovarian neoplasm,                                  endometriosis, PCO, physiologic.  Doubt                                  malignant neoplasm.                               2. Left ovarian cyst with similar appearance to                                  the right except smaller.  Pathology pending.  OPERATION PERFORMED:          1. Diagnostic/operative laparoscopy.                               2. Exploratory laparotomy with right                                  salpingo-oophorectomy.                               3. Left ovary biopsy.  SURGEON:  Rudy Jew. Ashley Royalty, M.D.  ANESTHESIA:  General.  ESTIMATED BLOOD LOSS:  Less than  50 cc.  COMPLICATIONS:  None.  PACKS AND DRAINS:  None.  DESCRIPTION OF PROCEDURE:     The patient was taken to the operating room and placed in dorsal lithotomy position.  After adequate general endotracheal anesthesia was administered, she was placed in the lithotomy position and prepped and draped in the usual sterile fashion for abdominal and vaginal surgery.  Posterior weighted retractor was placed in the vagina.  The anterior lip of the cervix was grasped with a single-toothed tenaculum.  ____________ uterine   manipulator was placed per cervix.  It was held in place wiht a tenaculum.  The bladder was drained with a red rubber catheter.  Due to the patients panniculus, decision was made to make sure the lower ports were placed more securely than normal to avoid being in the trough of her panniculus.  A 1.5 cm  infraumbilical incision was made in the skin.  An open "laparoscopic" technique was used in that the operator dissected into the peritoneal cavity prior to placement of the disposable "open" laparoscopic trocar.  The laparoscope was held in place with stay sutures in the fascia. The pneumoperitoneum was then created with CO2.  Next two suprapubic 5 mm ports were placed in the left and right lower quadrants respectively.  Again, deference was given to the fact that the patients obesity made it prudent in the operators view to go more superiorly to avoid being in the trough of the panniculus.  An attempt was made to use transillumination and direct visualization techniques.  The pelvis was thoroughly surveyed.  Due to the patients obesity, the pelvic contents were difficult to visualize.  The uterus was normal size, shape and contour.  The right ovary was markedly enlarged at approximately 7 cm greatest diameter with multiple cysts.  The left ovary was smaller but had a similar appearance to the right.  It appeared to be approximately 4 cm in greatest diameter.  The left ovary was visualized only with difficulty elevating it with laparoscopic instruments.  Peritoneal washings were obtained with Nizhat suction irrigator and sent to cytology department.  Appropriate photos were obtained.  The fallopian tubes were consistent with previous tubal sterilization procedure.  There were no other abnormalities noted.  As the process appeared to be benign.  The decision was made to proceed with minilaparotomy and right salpingo-oophorectomy for diagnosis.  Prior to proceeding the left ovary was biopsied  and the small specimen submitted to pathology for histologic studies.  At this point the abdominal instruments and the pneumoperitoneum evacuated. Fascial defects were closed with 0 Vicryl in an interrupted fashion.  The skin was closed with 3-0 chromic in a subcuticular fashion.  Next, a high Pfannenstiel incision was made, deviating more to the right side in deference to the fact that the mass was on the right side.  The subcutaneous tissues were sharply dissected down to the fascia which was nicked with a knife and incised transversely with Mayo scissors.  The underlying rectus muscles were separated from the midline fascia using sharp and blunt dissection.  The rectus muscles were separated in the midline.  The peritoneum was elevated and entered atraumatically with Metzenbaum scissors.  OSullivan-OConnor retractor was placed.  The bowel was packed cephalad.  The right ovary was brought through the incision.  Heaney clamps were used to ligate its blood supply.  The right tube and ovary were submitted to pathology for histologic studies.  The pedicle was secured doubly with #1 chromic catgut.  Hemostasis was  noted.  All packs were removed.  The patient was thus felt to have benefited maximally from surgical procedure at this time.  The fascia was then closed with 0 Vicryl in a running fashion.  The skin was closed with 3-0 chromic in a subcuticular fashion.  Vaginal instruments were removed and hemostasis noted.  The procedure was terminated.  The patient was taken to the recovery room in excellent condition. DD:  05/08/00 TD:  05/10/00 Job: [id number]

## 2010-12-14 NOTE — Assessment & Plan Note (Signed)
[facility]                              CARDIOLOGY OFFICE NOTE   Alexandra [name], Alexandra [name]                     MRN:          [id number]  DATE:04/09/2006                            DOB:          July 24, 1961    SUBJECTIVE:  Alexandra [name] is a very pleasant 50 year old female patient with a  history of hypertension, diabetes mellitus, dyslipidemia, and  hypothyroidism, who was admitted on September 2 with chest pain and syncope.  She ruled out for myocardial infarction.  We took her to the catheterization  lab and Dr. Juanda Chance performed a cath.  This showed normal coronary arteries  and good LV function.  Her EF was estimated at 60%.  Her D-dimer was  negative at 0.41.  It was felt that she most-likely had had a vasovagal  episode.  She was placed on an event monitor and she is currently wearing  that.  She follows up with Dr. Gala Romney in October to review the results of  that.  She returns today for post-hospitalization follow-up.  She is doing  well.  She denies any recurrent episodes of syncope.  She has had some  atypical chest pain associated with palpitations, most-recent of which was  last night.  She did record it on her monitor.  She denies any significant  shortness of breath.  Her cath site seems to be healing well.  She denies  any light-headedness or weakness.   CURRENT MEDICATIONS:  1. Glucophage 500 mg 3 times a day.  2. Lipitor 20 mg daily.  3. Levothyroxine 100 mcg daily.  4. Cytomel 25 mcg twice a day.  5. Micardis HCT 40/12.5 mg daily.  6. Multivitamin with iron daily.   PHYSICAL EXAM:  GENERAL:  She is a well-nourished, well-developed female in  no acute distress.  Blood pressure on the left is 104/68, on the right  104/60.  Pulse is 91, weight 196 pounds.  HEENT:  Unremarkable.  CARDIAC:  Normal S1 and S2.  Regular rate and rhythm.  LUNGS:  Clear to auscultation bilaterally.  EXTREMITIES:  Without edema.  Right groin without hematomas  or bruits.   Echocardiogram reveals sinus rhythm with a heart rate of 91, normal axis.  No acute changes.  QTc 426 ms.   IMPRESSION:  1. Unexplained syncope.  2. Normal coronary arteries.  3. Good left ventricular function.  4. Hypertension.  5. Diabetes mellitus type 2.  6. Dyslipidemia.  7. Hypothyroidism.  8. Non-cardiac chest pain.   PLAN:  The patient is doing well from a post-catheterization standpoint.  She will continue to wear her monitor.  She is to continue refraining from  driving until she is seen back by Dr. Gala Romney.  I have encouraged her to  get back to see her primary care physician, Dr. Lovell Sheehan, for further  management of her thyroid disease and diabetes.                                  Tereso Newcomer, PA-C  Veverly Fells. Excell Seltzer, MD   SW/MedQ  DD:  04/09/2006  DT:  04/09/2006  Job #:  [id number]   cc:   Stacie Glaze, MD

## 2010-12-14 NOTE — Op Note (Signed)
[facility]  Patient:    ICYSS, SKOG                     MRN: [id number] Proc. Date: 06/25/00 Adm. Date:  [id number] Disc. Date: [id number] Attending:  Wandalee Ferdinand                           Operative Report  PREOPERATIVE DIAGNOSES:       1. Chronic cystic ovarian disease.                               2. History of right ovarian cystadenoma.                               3. Probable left ovarian cystadenoma.                               4. Left lower quadrant discomfort, debilitating.                               5. Status post right salpingo-oophorectomy.  POSTOPERATIVE DIAGNOSES:      1. Chronic cystic ovarian disease.                               2. Adhesions from the omentum to the anterior                                  abdominal wall.  PROCEDURES:                   1. Diagnostic laparoscopy.                               2. Total abdominal hysterectomy with left                                  salpingo-oophorectomy.  SURGEON:                      Rudy Jew. Ashley Royalty, M.D.  ASSISTANT:                    Raynald Kemp, M.D.  ANESTHESIA:                   General.  ESTIMATED BLOOD LOSS:         900 cc.  COMPLICATIONS:                None.  PACKS AND DRAINS:             Foley and Jackson-Pratt drain to the subcutaneous layer.  DESCRIPTION OF PROCEDURE:     The patient was taken to the operating room and placed in the dorsosupine position.  After adequate general anesthesia was administered, she was placed in the lithotomy position and prepped and draped in the usual manner for abdominal and vaginal surgery.  A posterior weighted retractor was placed per vagina.  A Hulka tenaculum was placed into the uterine cavity.  A Foley catheter was placed.  Attention was turned to the diagnostic laparoscopy.  A 1.5 cm infraumbilical incision was made in the transverse plane.  The size 10-12 disposable trocar was placed into the  abdominal cavity without difficulty.  Its location was verified by placement of the laparoscope.  There was no evidence of any trauma.  A pneumoperitoneum was created and maintained throughout the laparoscopy with carbon dioxide.  Next, two 5 mm suprapubic trocars were placed in the left and right lower quadrants, respectively.  The placement was performed using transillumination and direct visualization techniques.  Due to the patients obesity, the transillumination technique was not very helpful, but the direct visualization was most helpful in avoiding any trauma.  At this point, the abdomen and pelvis were thoroughly reviewed.  There was an obvious adhesion from the omentum to the anterior abdominal wall, which was thin, but rather tense, which obscured the visualization of the pelvis.  Using the bipolar cautery and scissors, this was lysed successfully.  Next, the patient was placed in Trendelenburg and thorough survey was performed.  The uterus was normal size, shape, and contour without evidence of any fibroids or endometriosis.  The left ovary had numerous surface excrescences resembling the previously removed right ovary which showed an ovarian serous cystadenoma. The right ovary and tube were surgically absent.  However, the structure resembling the previous infundibular pelvic ligament appeared to be attache to the right aspect of the the uterus.  At this point, it was felt that the patient was still a candidate for possible laparoscopically-assisted vaginal hysterectomy.  Hence, the left infundibular pelvic ligament was identified. Using the bipolar cautery with the cutting forceps through the 5 mm inferior port, the infundibular pelvic ligament was cauterized and cut.  There was a small amount of bleeding noted afterwards, which was controlled with the bipolar cautery.  Occasionally there was noted to be some additional oozing from this area and the operator felt uncomfortable  with the continued bipolar cautery in order to avoid any injury to the urinary tract.  The decision was thus ultimately to proceed with laparotomy and total abdominal hysterectomy with left salpingo-oophorectomy.  The umbilical defects were closed with 0 Vicryl in an interrupted fashion.  The skin was closed with 3-0 chromic in a subcuticular fashion.  Attention was then turned to the laparotomy.  A Pfannenstiel incision was made approximately 3 cm above the symphysis pubis, paying careful attention to avoid the nadir of the patients panniculus.  It turned out that the suprapubic incisions could be incorporated into the Pfannenstiel incision and hence the 5 mm trocar incisions did not need to be separately closed, but rather incorporated into the Pfannenstiel incision.  The subcutaneous tissues were sharply and bluntly dissected down to the fascia, which was nicked with a knife and incised transversely with Mayo scissors.  The underlying rectus muscles were separated from the overlying fascia using sharp and blunt dissection.  It should be mentioned that the patient was noted to be incredibly scarred with a very thick fascia layer, requiring approximately one hour in order to successfully enter the abdominal cavity.  The abdominal cavity was successfully entered and the peritoneal incision was extended longitudinally.  Careful attention was paid to avoid trauma to the bladder inferiorly.  A Balfour and then an OConnor-OSullivan retractor was placed into the patients abdominal cavity.  The upper abdomen was successfully packed off.  The uterus was grasped bilaterally  with Kelly clamps and elevated.  The round ligaments were bilaterally clamped, cut, and secured with 1-0 chromic catgut.  The structure on the right aspect of the uterus consistent with the previous infundibular pelvic ligament was clamped, cut, and doubly secured with 1-0 chromic catgut.  Careful attention was paid to avoid  injury to the urinary tract.  The broad ligament was opened bilaterally. The bladder was advanced inferiorly.  At the level of the isthmus, the uterine vessels were bilaterally clamped, cut, and doubly secured with 1-0 chromic  catgut.  The bladder was further advanced inferiorly.  The exposure was difficult and the procedure time consuming due to the patients obesity and scarring from previous surgery.  The left adnexa was separately excised and submitted to pathology for histologic studies.  After the uterine vessels were successfully ligated, the corpus was similarly excised and submitted to pathology for histologic studies.  The cervix was grasped with Kochers and cardinal ligaments bilaterally clamped, cut, and secured with 1-0 chromic catgut.  At the level of the vagina, the vagina was entered and the cervix successfully excised and submitted separately to pathology for histologic studies.  Richardson angle sutures were then placed with 1-0 chromic catgut. Next a circumferential running locking suture of 2-0 Vicryl was placed on the vaginal cuff to provide hemostasis.  Hemostasis was noted.  On additional suture was placed in the midline to avoid any herniation through the vaginal cuff.  At this point, the pelvis was thoroughly surveyed and noted to be hemostatic.  The ureters were noted bilaterally to be functioning and without evidence of any trauma.  Copious irrigation was accomplished.  All packs were removed.  The fascia was then closed with 0 Vicryl in a running fashion.  Due to the thickness of the patients subcutaneous layer, the decision was made to proceed with placement of a Jackson-Pratt drain.  This was accomplished and brought out through separate stab incision.  The skin was then closed with staples.  At the conclusion of the procedure, the urine was clear and copious. The patient was taken to the recovery room in excellent condition. DD:  06/25/00 TD:  06/25/00 Job:  [id number]

## 2010-12-14 NOTE — Cardiovascular Report (Signed)
NAMEDEANDRIA, KLUTE              ACCOUNT NO.:  [id number]   MEDICAL RECORD NO.:  [id number]          PATIENT TYPE:  INP   LOCATION:  2007                         FACILITY:  [facility]   PHYSICIAN:  Everardo Beals. 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  She has a history of diabetes, hypertension and hyperlipidemia.  She was  admitted to the hospital with about 24 hours of intermittent chest pain  followed by a syncopal episode.  She was brought in by EMS.  She is  scheduled for evaluation with angiography today.   PROCEDURE:  The procedure was performed by the right femoral artery and  arterial sheath __________ coronary catheters.  A femoral arteriogram was  performed and Omnipaque contrast was used.  This fluorogram was performed to  rule out renovascular causes for hypertension.  The patient tolerated  procedure well and left the lab in satisfactory condition.   RESULTS:  The aortic pressure was 133/75 with mean of 98 and the left  ventricular pressure was 133/24.   Left main coronary artery:  Left main coronary artery was free of  significant disease.   Left anterior descending artery:  Left anterior descending artery gave rise  to a large diagonal branch, small diagonal branch and septal perforator.  These and the LV proper were free of significant disease.   Circumflex artery:  The circumflex artery gave rise to an atrial branch, a  ramus branch, a marginal branch and a posterolateral branch.  These vessels  were free of significant disease.   Right coronary artery:  Right coronary artery was a moderate-sized vessel.  It gave rise to a __________  branch, a right ventricular branch, a  posterior descending branch and two posterolateral branches.  These vessels  were free of significant  disease.   Left ventriculogram:  Left ventriculogram performed in the RAO projection  showed good wall motion with no areas of hypokinesis.  The estimated  ejection fraction was 60%.  This was inadvertently recorded on fluoro rather  than cine so we do not have permanent documentation.   __________  was performed.  It showed patent renal arteries and no  significant aortoiliac obstruction.   CONCLUSION:  Normal coronary angiography and left ventricular wall motion.   RECOMMENDATIONS:  The patient does not have ischemic heart disease and the  left ventricular function is normal.  The etiology of the recent syncopal  episode is not clear, although it seems likely this may be a vasovagal  syncope.  Will plan to discharge the patient later today and get a 30-day  event monitor.  Will plan to seal the right femoral artery with a femoral  artery patch.   The patient does have a history of prolonged QT but her QTc on her EKG here  is 465.  She was seen in consultation by Dr. Ladona Ridgel who recommended the  event monitor if her coronary arteries were normal.  ______________________________  Everardo Beals Juanda Chance, MD, [facility]     BRB/MEDQ  D:  04/01/2006  T:  04/01/2006  Job:  [id number]   cc:   Stacie Glaze, MD  Barbette Hair. Artist Pais, DO

## 2010-12-14 NOTE — Op Note (Signed)
Alexandra [name], Alexandra [name]              ACCOUNT NO.:  [id number]   MEDICAL RECORD NO.:  [id number]          PATIENT TYPE:  AMB   LOCATION:  [facility]                         [facility]AC[facility]   PHYSICIAN:  Vania Rea. Supple, M.D.  DATE OF BIRTH:  01/01/1961   DATE OF PROCEDURE:  05/31/2004  DATE OF DISCHARGE:                                 OPERATIVE REPORT   PREOPERATIVE DIAGNOSES:  1.  Right shoulder impingement syndrome.  2.  Right shoulder acromioclavicular joint arthrosis.   POSTOPERATIVE DIAGNOSES:  1.  Right shoulder impingement syndrome.  2.  Right shoulder acromioclavicular joint arthrosis.  3.  Extensive circumferential labral tear.  4.  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   PROCEDURE:  1.  Right shoulder exam under anesthesia.  2.  Right shoulder glenohumeral joint diagnostic arthroscopy.  3.  Debridement of circumferential degenerative labral tear.  4.  Synovectomy and debridement of capsular adhesions surrounding the biceps      tendon.  5.  Arthroscopic subacromial decompression and bursectomy.  6.  Arthroscopic distal clavicle resection.   SURGEON:  Vania Rea. Supple, M.D.   Threasa HeadsFrench Ana A. Shuford, P.A.-C.   ANESTHESIA:  General endotracheal as well as preoperative interscalene  block.   ESTIMATED BLOOD LOSS:  Minimal.   DRAINS:  None.   HISTORY:  Ms. Albertsen is a 50 year old female who has had chronic right  shoulder pain, weakness, limitations in motion with examination showing a  positive impingement sign and weakness of testing of the rotator cuff.  Preoperative MRI scanning confirmed tendinosis of the rotator cuff and AC  joint arthropathy. Due to ongoing pain and functional limitation, she is  brought to the operating room at this time for planned right shoulder  arthroscopy was described below.   Preoperatively, Ms. Dutil was counseled on treatment options as well as risks  versus benefits. Possible surgical complications bleeding,  infection,  neurovascular injury, persistent pain, loss of motion, anesthetic  complications, possible need for additional surgery were reviewed. She  understands and accepts and agrees with the planned procedure.   PROCEDURE IN DETAIL:  After undergoing routine preoperative evaluation, the  patient received prophylactic antibiotics. Interscalene block established in  the holding area by the anesthesia department. Placed supine on the  operating table and underwent induction of general endotracheal anesthesia.  Turned to the left lateral decubitus position on a beanbag and appropriately  padded and protected. Right arm was suspended at the 70/30 position with 10  pounds of traction. Exam under anesthesia showed full passive motion and no  evidence for instability. The right shoulder girdle region was then  sterilely prepped and draped in standard fashion. Standard posterior portal  was established in the glenohumeral joint, then anterior portal was  established under direct visualization. The glenohumeral articular surfaces  were in good condition. There was an obvious complex degenerative tear  involving the entire  circumference of the labrum with several large  pedunculated fragments of labral tissue, primarily inferiorly as well as  posteriorly that could be seen to flip into the joint and become  incarcerated. A shaver  was introduced, and working from both anterior and  posterior aspects of the shoulder joint, the labrum was circumferentially  debrided. I did not, however, identify any obvious instability patterns. The  rotator cuff was carefully inspected and found to be intact with no visible  fraying or degeneration. No defects. The biceps tendon was in good  condition, but there was several large fibrous bands surrounding the biceps  tendon over its mid portion, and these were divided and excised with the  shaver. The biceps anchor was well attached, and the biceps tendon was   unremarkable as it passed through the rotator interval. Remaining inspection  of the glenohumeral joint showed no obvious additional pathology. Fluid and  instruments were then removed. The arm was dropped down to 30 degrees of  abduction with the arthroscope introduced into the subacromial space of the  posterior portal, and a direct lateral portal was established in the  subacromial space. Abundant proliferative and fibrotic bursal tissue was  encountered, and this was removed with combination of a shaver and the  Arthrex wand. The wand was then used to remove the periosteum from the inner  surface of the anterior half of the acromion, and then a bur was introduced  and used to perform a subacromial decompression, creating a type of  morphology. A portal was then established directly anterior to the distal  clavicle, and distal clavicle resection was performed with a bur. Care was  taken to make sure that the entire circumference of the distal clavicle  could be visualized to ensure adequate removal of bone. The subacromial  bursectomy was then completed, and the bursal surface of the rotator cuff  was carefully inspected. It was found to be intact with no visible fraying  or degeneration. Final hemostasis was obtained. Fluid and instruments were  removed. The portal was closed with Monocryl and Steri-Strips. A bulky dry  dressing taped over the right shoulder and right arm was placed in a sling.  The patient rolled supine, extubated, and taken to the recovery room in  stable condition.     Sage.Pita   KMS/MEDQ  D:  05/31/2004  T:  05/31/2004  Job:  [id number]

## 2010-12-14 NOTE — H&P (Signed)
Alexandra [name]              ACCOUNT NO.:  [id number]   MEDICAL RECORD NO.:  [id number]          PATIENT TYPE:  INP   LOCATION:  2007                         FACILITY:  [facility]   PHYSICIAN:  Alexandra [name], MDDATE OF BIRTH:  [birth year]   DATE OF ADMISSION:  03/30/2006  DATE OF DISCHARGE:                                HISTORY & PHYSICAL   PRIMARY CARE PHYSICIAN:  Dr. Lovell Sheehan.   HISTORY OF PRESENT ILLNESS:  Alexandra [name] is a 50 year old African-American  female with no known history of coronary artery disease that complains of  chest discomfort, intermittently over the last 2 to 3 days with increased  intensity and increased in frequency and status post syncopal episode  yesterday evening. Alexandra [name] states that she has had substernal chest  discomfort that she describes as pressure and heaviness, rating it a 5 to 6  at times. She states it usually lasts around 2 to 3 minutes. It is not  associated with any activity, position, or exertion. It has occurred while  she has been relaxing before. Has occurred at sleep during the night.  Yesterday, she had it while she was driving around 6:57 p.m. She had not  done any heavy lifting or exertion. Apparently, yesterday evening, the chest  discomfort continued intermittently. She went home, ate dinner, was relaxing  and reading a book when the pressure and heaviness increased in intensity.  She rates it about an 8 at that time. It radiated up into her left shoulder.  She became short of breath and dizzy. She did not have any diaphoresis. She  states she got up and went into the kitchen to get something to drink and as  she was walking into the kitchen, the dizziness got worse. She yelled out  for her daughter. The next thing that she remembers is waking up with EMS  standing over her, inserting an IV. She states she received 4 baby aspirin  by EMS. On arrival to the ER, her chest pain was around a 5. Per EMS report,  when they arrived at  patient's home, she was hypotensive. EKG showed  trigeminy. The patient received an intravenous fluid bolus and was  transported to [facility]. She currently is pain free.   ALLERGIES:  SHRIMP, TYLOX.   MEDICATIONS:  Include Lipitor 20, Glucophage 500 t.i.d., Levothyroxine 100  mcg daily, HCTZ 12.5, __________ , unknown dose. KCL 10 meq daily.   PAST MEDICAL HISTORY:  Includes diabetes type 2, hypertension,  hypercholesterolemia, polycystic ovarian disease status post hysterectomy, a  goiter, status post thyroidectomy and now hypothyroidism, cholecystectomy,  obesity.   The patient had a stress test back in 2005 that showed a normal ejection  fraction. No ischemia. However, patient hypertensive with a blood pressure  of 180/110 during recovery.   SOCIAL HISTORY:  The patient lives in [place] with her family. She  works in Clinical biochemist. Tobacco use, she quit 18 years ago. Denies any  ETOH, drug, or herbal medication use. She tries to follow an ADA diet. No  exercise.   FAMILY HISTORY:  Mother alive with  a history of diabetes and hypertension.  She has some kind of heart problem the patient reports, but she does not  know what it is. Grandmother has had heart valve surgery. Father is alive  and well with some prostate problems. Siblings without any known coronary  artery disease.   REVIEW OF SYSTEMS:  Positive for blurred vision at times, chest pain,  shortness of breath, dyspnea on exertion, palpitations, and syncope.  GASTROINTESTINAL:  Positive for melena in July. The patient states she has  hemorrhoids. She also has gastroesophageal reflux disease symptoms.   PHYSICAL EXAMINATION:  VITAL SIGNS:  Temperature 97.6, pulse 85, respiratory  rate 18, blood pressure 132/74. The patient is sating 100% on room air.  GENERAL:  No acute distress. She is somewhat sleepy. A rather flat affect at  times.  NECK:  Supple without lymphadenopathy. Negative bruit and negative  JVD. She  has a well healed scar horizontally across her neck.  CARDIOVASCULAR:  S1 and S2. Regular rate and rhythm. Pulses are 2+ and equal  without bruits.  LUNGS:  Clear to auscultation bilaterally with poor inspiratory effort.  ABDOMEN:  Soft, nontender, with positive bowel sounds.  EXTREMITIES:  Without clubbing, cyanosis, or edema.  NEUROLOGIC:  Alert and oriented times three. Cranial nerves 2-12 are grossly  intact.   LABORATORY DATA:  Chest x-ray showing cardiomegaly, no acute findings.   EKG:  Sinus rhythm with frequent PVC's, multi-focal at a rate of 88. No  acute ST or T wave changes noted.   Hemoglobin and hematocrit of 12 and 35.3. White blood cell count is 10.4.  Platelet 394,000. Sodium 142, potassium 3.3, BUN 12, creatinine 0.6 with a  glucose of 166. BNP less than 30. D-dimer is 0.141. First set of cardiac  markers:  Troponin was 0.01.   IMPRESSION/RECOMMENDATIONS:  The patient admitted by Internal Medicine with  chest pain and syncopal episode. EKG with frequent PVC's and trigeminy. Will  plan on doing a 2-D echocardiogram. If ejection fraction is low, the patient  will need an EP evaluation. Otherwise, a loop recorder. The patient's chest  pain is also concerning. We will plan on doing a cardiac catheterization to  evaluate coronary anatomy. The risks and benefits have been discussed with  the patient. The patient agrees to proceed with diagnostic catheterization  and further EP evaluation if needed. Will hold Metformin. The patient will  need prophylactic IVP dye treatment. Will plan on doing a cardiac  catheterization on Tuesday, as Monday is a holiday.     ______________________________  Dorian Pod, ACNP      Alexandra Buckles. Bensimhon, MD  Electronically Signed    MB/MEDQ  D:  03/30/2006  T:  03/30/2006  Job:  [id number]

## 2010-12-14 NOTE — Assessment & Plan Note (Signed)
[facility]                              CARDIOLOGY OFFICE NOTE   Alexandra, [name]                     MRN:          [id number]  DATE:05/06/2006                            DOB:          19-Feb-1961    PRIMARY CARE PHYSICIAN:  Stacie Glaze, M.D.   PATIENT IDENTIFICATION:  Alexandra [name] is a very pleasant 50 year old woman who  returns for further evaluation of her chest pain.   PROBLEM LIST:  1. Chest pain. Cardiac catheterization in September 2007 showed normal      coronary arteries and normal left ventricular function.  2. Palpitations. Holter monitor showed multiple premature ventricular      contractions with some bigeminy and trigeminy.  3. Diabetes.  4. Obesity.  5. Hypertension.  6. Hyperlipidemia.  7. History of hypothyroidism. She is status post thyroidectomy.   CURRENT MEDICATIONS:  1. Glucophage 500 t.i.d.  2. Lipitor 20 a day.  3. Levothyroxine 100 mcg a day.  4. Micardis hydrochlorothiazide 40/12.5.  5. Cytomel 25 mcg b.i.d.  6. Multivitamin.   She has no known drug allergies.   INTERVAL HISTORY:  Mr. Braatz returns today for routine followup. She says she  is continuing to have episodes of chest pain. These can happen at any time.  They are not necessarily to exertion. There is no shortness of breath  associated to them. They can last all day or can just last an hour or two.  She denies any significant acid reflux, though she does take Prevacid on a  p.r.n. basis.   PHYSICAL EXAMINATION:  She is well appearing in no acute distress. Ambulates  without difficulty. Blood pressure is 100/60, heart rate 60, weight 197.  HEENT:  Sclerae anicteric. EOMI. There is no xanthelasma. Mucous membranes  are moist.  NECK:  Supple. There is redundant skin from her previous thyroid surgery. No  obvious mass. Carotids are 2+ bilaterally without bruits. There is no  lymphadenopathy or thyromegaly.  CARDIAC:  She has a regular rate and  rhythm with no murmurs, rubs, or  gallops.  LUNGS:  Clear.  ABDOMEN:  Obese, nontender, nondistended. No hepatosplenomegaly. No bruits.  No masses.  EXTREMITIES:  Warm with no clubbing, cyanosis, or edema.  NEUROLOGICAL:  She is alert and oriented x3. Cranial nerves II-XII are  intact. Moves all four extremities without difficulty. Affect is pleasant.   ASSESSMENT AND PLAN:  1. Chest pain. Given her normal catheterization, this is likely      noncardiac. I have asked her to resume her Prevacid on a daily basis.      If this is not improved, consider a possible GI evaluation.  2. Palpitations. Monitor shows frequent PVCs with occasional bigeminy and      trigeminy. This is likely benign. I told her to avoid caffeine, and if      remains symptomatic, would consider a low-dose beta blocker.   DISPOSITION:  Return to clinic on a p.r.n. basis.       Bevelyn Buckles. Bensimhon, MD     DRB/MedQ  DD:  05/06/2006  DT:  05/08/2006  Job #:  [id number]   cc:   Stacie Glaze, MD

## 2010-12-14 NOTE — H&P (Signed)
NAMEMARLO, ARRIOLA              ACCOUNT NO.:  [id number]   MEDICAL RECORD NO.:  [id number]          PATIENT TYPE:  INP   LOCATION:  2007                         FACILITY:  [facility]   PHYSICIAN:  Barbette Hair. Artist Pais, DO      DATE OF BIRTH:  05-01-1961   DATE OF ADMISSION:  03/30/2006  DATE OF DISCHARGE:                                HISTORY & PHYSICAL   PRIMARY CARE PHYSICIAN:  Darryll Capers, M.D.   CHIEF COMPLAINT:  Chest pain and syncope.   HISTORY OF PRESENT ILLNESS:  The patient is a 50 year old African-American  female with past medical history of type 2 diabetes and hypertension, who  experienced chest pain and syncopal episode at around noon on March 29, 2006. The patient states that she was getting out from her bathroom to walk  to her kitchen, when she felt a mild pressure sensation in the center of her  chest. This lasted several minutes with associated shortness of breath. Soon  thereafter, the husband notes the patient passed out, at which time he  called EMS. The patient states that she was in her usual state of health  prior to syncopal episode and chest pain episode. No recent illness. No  recent nausea and vomiting or diarrhea. The patient denies any medication  changes. She has had 1 syncopal episodes in the remote past. She is noted to  have normal ejection fraction. Had an echocardiogram in March of 2007 with  ejection fraction of approximately 60% to 65%. There is also the possibility  that she experience palpitations prior to her syncopal episode. The patient  has a history of having QTC's in the past.   PAST MEDICAL HISTORY:  1. Hypertension.  2. Type 2 diabetes.  3. Dyslipidemia.  4. Hypothyroidism.   PAST SURGICAL HISTORY:  1. History of thyroidectomy.  2. Back surgery in 1998.  3. Tubal ligation.  4. Right salpingo-oophorectomy.  5. Hysterectomy with left salpingo-oophorecto73my.  6. Left shoulder for arthroscopic surgery for impingement.   SOCIAL  HISTORY:  The patient is married. Accompanied by her husband. Her  occupation is working in Clinical biochemist. No alcohol, no tobacco.   REVIEW OF SYSTEMS:  As noted above. No dysuria, no fever. All other systems  negative.   LABORATORY DATA:  A pH of 7.380, pCO2 was 41.7, bicarb 25. CBC showed white  blood cell count of 10.4. Hemoglobin and hematocrit 12.0 and 35.3. Platelets  of 394,000. D-dimer was 0.41. Basic metabolic profile:  Sodium 142,  potassium 3.3, chloride 107, bicarb 25, BUN 12, creatinine 0.6. Blood sugar  of 156. Initial set of cardiac enzymes was 0.05. The BNP was less than 30.   Chest x-ray was also performed in the ER, which showed cardiomegaly with no  active disease.   EKG showed normal sinus rhythm at 88 beats per minute. The patient has  frequent PVC's.   PHYSICAL EXAMINATION:  VITAL SIGNS:  Temperature 97.6, blood pressure  132/74. Pulse 85, respiratory rate 18. The patient is 100% on room air.  GENERAL:  The patient is a pleasantly, morbidly obese, 50 year old African-  American female. Awake, alert, in no apparent distress.  HEENT:  Normocephalic and atraumatic. Pupils are equal, round, and reactive  to light bilaterally. Extraocular muscles intact. The patient is anicteric.  Conjunctivae was within normal limits. Moist mucous membranes. The patient  had an unremarkable oropharyngeal examination. The patient had oral  crowding.  NECK:  The patient had full neck with lower neck scar from previous  thyroidectomy. No bruit appreciated.  CHEST:  Normal inspiratory effort. Clear to auscultation bilaterally. No  rhonchi, rales, or wheezing.  CARDIOVASCULAR:  Regular rate and rhythm. No significant murmurs, rubs, or  gallops appreciated. Heart sounds are somewhat distant.  ABDOMEN:  Protuberant, nontender, positive bowel sounds. Unable to  appreciate organomegaly.  EXTREMITIES:  No clubbing, cyanosis, or edema.  SKIN:  Warm and dry.  NEUROLOGIC:  Cranial nerves  2-12 are grossly intact. She was non-focal.   CURRENT MEDICATIONS:  1. Micardis/hydrochlorothiazide 40/12.5 once daily.  2. Lipitor 20 mg p.o. q.h.s.  3. Glucophage 500 mg p.o. t.i.d. with meals.  4. Levothyroxine 100 mcg once daily.  5. Potassium chloride 10 meq b.i.d.  6. Cytomel 25 mcg 2 tablets once daily.   IMPRESSION:  1. Chest pain, possible unstable angina.  2. Syncope.  3. Hypertension.  4. Type 2 diabetes.  5. Hypothyroidism.  6. Hyperlipidemia.   RECOMMENDATIONS:  The patient will be admitted for observation and monitor  on telemetry. Will cycle cardiac enzymes and repeat 2-D echocardiogram. Her  history is suggestive of cardiogenic syncope but will defer to cardiology,  whether patient needs stress testing during this admission. Due to her  relative hypotension, we will hold the patient's Micardis, provide gentle IV  hydration, check orthostatic blood pressures, and hold Glucophage. Cover her  with NovoLog sliding scale. We will also check her thyroid function studies.     Barbette Hair. Artist Pais, DO  Electronically Signed    RDY/MEDQ  D:  03/30/2006  T:  03/30/2006  Job:  [id number]   cc:   Stacie Glaze, MD

## 2011-03-20 ENCOUNTER — Other Ambulatory Visit: Payer: Self-pay | Admitting: Internal Medicine

## 2011-03-20 DIAGNOSIS — Z0000 Encounter for general adult medical examination without abnormal findings: Secondary | ICD-10-CM

## 2011-03-22 ENCOUNTER — Other Ambulatory Visit (INDEPENDENT_AMBULATORY_CARE_PROVIDER_SITE_OTHER): Payer: Managed Care, Other (non HMO)

## 2011-03-22 ENCOUNTER — Encounter: Payer: Managed Care, Other (non HMO) | Admitting: *Deleted

## 2011-03-22 DIAGNOSIS — Z0000 Encounter for general adult medical examination without abnormal findings: Secondary | ICD-10-CM

## 2011-03-22 LAB — LIPID PANEL
Cholesterol: 160 mg/dL (ref 0–200)
HDL: 42.7 mg/dL (ref >39.00)
LDL Cholesterol: 105 mg/dL — ABNORMAL HIGH (ref 0–99)
Triglycerides: 60 mg/dL (ref 0.0–149.0)
VLDL: 12 mg/dL (ref 0.0–40.0)

## 2011-03-22 LAB — CBC WITH DIFFERENTIAL/PLATELET
Basophils Absolute: 0 10*3/uL (ref 0.0–0.1)
Basophils Relative: 0.3 % (ref 0.0–3.0)
Eosinophils Absolute: 0.1 10*3/uL (ref 0.0–0.7)
Lymphocytes Relative: 34 % (ref 12.0–46.0)
MCHC: 33.2 g/dL (ref 30.0–36.0)
MCV: 90.6 fl (ref 78.0–100.0)
Monocytes Absolute: 0.7 10*3/uL (ref 0.1–1.0)
Neutrophils Relative %: 55.8 % (ref 43.0–77.0)
Platelets: 364 10*3/uL (ref 150.0–400.0)
RDW: 14.5 % (ref 11.5–14.6)

## 2011-03-22 LAB — BASIC METABOLIC PANEL
BUN: 11 mg/dL (ref 6–23)
Chloride: 103 mEq/L (ref 96–112)
Creatinine, Ser: 0.5 mg/dL (ref 0.4–1.2)
GFR: 156.4 mL/min (ref >60.00)

## 2011-03-22 LAB — HEPATIC FUNCTION PANEL
AST: 21 U/L (ref 0–37)
Albumin: 3.8 g/dL (ref 3.5–5.2)
Total Bilirubin: 0.7 mg/dL (ref 0.3–1.2)

## 2011-03-22 LAB — TSH: TSH: 1.63 u[IU]/mL (ref 0.35–5.50)

## 2011-03-25 ENCOUNTER — Telehealth: Payer: Self-pay | Admitting: *Deleted

## 2011-03-25 ENCOUNTER — Other Ambulatory Visit: Payer: Self-pay | Admitting: *Deleted

## 2011-03-25 NOTE — Progress Notes (Signed)
  Subjective:    Patient ID: Alexandra [name], female    DOB: Jan 29, 1961, 50 y.o.   MRN: [id number]  HPI    Review of Systems     Objective:   Physical Exam        Assessment & Plan:  Opened in error This encounter was created in error - please disregard.

## 2011-03-25 NOTE — Telephone Encounter (Signed)
Opened in error

## 2011-03-25 NOTE — Progress Notes (Deleted)
Labs entered.

## 2011-03-28 ENCOUNTER — Encounter: Payer: Self-pay | Admitting: Internal Medicine

## 2011-03-28 ENCOUNTER — Telehealth: Payer: Self-pay | Admitting: Internal Medicine

## 2011-03-28 ENCOUNTER — Ambulatory Visit (INDEPENDENT_AMBULATORY_CARE_PROVIDER_SITE_OTHER): Payer: Managed Care, Other (non HMO) | Admitting: Internal Medicine

## 2011-03-28 VITALS — BP 140/80 | HR 76 | Temp 98.2°F | Resp 16 | Ht 62.5 in | Wt 192.0 lb

## 2011-03-28 DIAGNOSIS — Z0000 Encounter for general adult medical examination without abnormal findings: Secondary | ICD-10-CM

## 2011-03-28 DIAGNOSIS — D6489 Other specified anemias: Secondary | ICD-10-CM

## 2011-03-28 DIAGNOSIS — E039 Hypothyroidism, unspecified: Secondary | ICD-10-CM

## 2011-03-28 DIAGNOSIS — Z9884 Bariatric surgery status: Secondary | ICD-10-CM

## 2011-03-28 DIAGNOSIS — E785 Hyperlipidemia, unspecified: Secondary | ICD-10-CM

## 2011-03-28 NOTE — Telephone Encounter (Signed)
Per dr Cecil Cranker- no labs needed-Left message on machine For pt

## 2011-03-28 NOTE — Progress Notes (Signed)
Subjective:    Patient ID: Alexandra [name], female    DOB: 1960/11/15, 50 y.o.   MRN: [id number]  HPI  Patient is for complete physical examination with labs prior.  She has a chief complaint today of multiple arthritic pain bilateral foot pain for which she is seeing a podiatrist and had injection therapy and shoulder pain post shoulder surgery by Dr. Margaree Mackintosh for rotator cuff on the right shoulder.  We follow her for very fracture surgery status with a history of hypothyroidism and hyperlipidemia. Prior to her bariatric surgery she did have a history of elevated blood glucose since her surgery her sugars have been in excellent control her hemoglobin is stable at 13.1 her hematocrit is 39.4 her thyroid is stable at 1.63 her liver functions are stable and she has excellent cholesterol of a total of 160 with an HDL of 42 and an LDL of 105  Review of Systems  Constitutional: Negative for activity change, appetite change and fatigue.  HENT: Negative for ear pain, congestion, neck pain, postnasal drip and sinus pressure.   Eyes: Negative for redness and visual disturbance.  Respiratory: Negative for cough, shortness of breath and wheezing.   Gastrointestinal: Negative for abdominal pain and abdominal distention.  Genitourinary: Negative for dysuria, frequency and menstrual problem.  Musculoskeletal: Negative for myalgias, joint swelling and arthralgias.  Skin: Negative for rash and wound.  Neurological: Negative for dizziness, weakness and headaches.  Hematological: Negative for adenopathy. Does not bruise/bleed easily.  Psychiatric/Behavioral: Negative for sleep disturbance and decreased concentration.   Past Medical History  Diagnosis Date  . Diabetes mellitus   . Chronic back pain   . Hypertension   . Thyroid disease   . Hyperlipidemia    Past Surgical History  Procedure Date  . Cholecystectomy   . Abdominal hysterectomy   . Bariatric surgery   . Back surgery   . Tubal ligation    . Cardiac catheterization     reports that she has never smoked. She does not have any smokeless tobacco history on file. She reports that she does not drink alcohol or use illicit drugs. family history is not on file. Allergies  Allergen Reactions  . Ace Inhibitors     REACTION: cough  . Oxycodone-Acetaminophen     REACTION: rash  . Shrimp Flavor        Objective:   Physical Exam  Vitals reviewed. Constitutional: She is oriented to person, place, and time. She appears well-developed and well-nourished. No distress.  HENT:  Head: Normocephalic and atraumatic.  Right Ear: External ear normal.  Left Ear: External ear normal.  Nose: Nose normal.  Mouth/Throat: Oropharynx is clear and moist.  Eyes: Conjunctivae and EOM are normal. Pupils are equal, round, and reactive to light.  Neck: Normal range of motion. Neck supple. No JVD present. No tracheal deviation present. No thyromegaly present.  Cardiovascular: Normal rate, regular rhythm, normal heart sounds and intact distal pulses.   No murmur heard. Pulmonary/Chest: Effort normal and breath sounds normal. She has no wheezes. She exhibits no tenderness.  Abdominal: Soft. Bowel sounds are normal.  Musculoskeletal: Normal range of motion. She exhibits no edema and no tenderness.  Lymphadenopathy:    She has no cervical adenopathy.  Neurological: She is alert and oriented to person, place, and time. She has normal reflexes. No cranial nerve deficit.  Skin: Skin is warm and dry. She is not diaphoretic.  Psychiatric: She has a normal mood and affect. Her behavior is normal.  Assessment & Plan:  Patient uses orthotics in her feet but she still has persistent foot pain.  Her shoulder has not completely healed after surgery and she has some persistent pain in the right shoulder.  Her bariatric surgery status is apparently stable with all indications of vitamin deficiencies being normal.   This is a routine physical  examination for this healthy  Female. Reviewed all health maintenance protocols including mammography colonoscopy bone density and reviewed appropriate screening labs. Her immunization history was reviewed as well as her current medications and allergies refills of her chronic medications were given and the plan for yearly health maintenance was discussed all orders and referrals were made as appropriate.  13 pound weight gain since last office visit was discussed much of this is probably due to inactivity but also may be due to otherwise are unhealthy choices and foods.  Concern is the fact that she was a diabetic before her bariatric surgery and with this weight gain her blood sugars are increasing the risk of resuming diabetes are very high and she is genetically predisposed to this  disease

## 2011-03-28 NOTE — Telephone Encounter (Signed)
Pt was seen this morning and would like her lab orders sent to [facility]. Thanks.

## 2011-03-29 ENCOUNTER — Encounter: Payer: Managed Care, Other (non HMO) | Admitting: Internal Medicine

## 2011-04-02 ENCOUNTER — Other Ambulatory Visit: Payer: Self-pay | Admitting: Internal Medicine

## 2011-04-02 DIAGNOSIS — Z1231 Encounter for screening mammogram for malignant neoplasm of breast: Secondary | ICD-10-CM

## 2011-04-08 ENCOUNTER — Ambulatory Visit
Admission: RE | Admit: 2011-04-08 | Discharge: 2011-04-08 | Disposition: A | Discharge status: Home / Self Care | Payer: Managed Care, Other (non HMO) | Source: Ambulatory Visit | Attending: Internal Medicine | Admitting: Internal Medicine

## 2011-04-08 DIAGNOSIS — Z1231 Encounter for screening mammogram for malignant neoplasm of breast: Secondary | ICD-10-CM

## 2011-04-17 LAB — I-STAT 8, (EC8 V) (CONVERTED LAB)
BUN: 11
Chloride: 107
Glucose, Bld: 101 — ABNORMAL HIGH
HCT: 42
Hemoglobin: 14.3
Operator id: 151321
Sodium: 145

## 2011-04-17 LAB — POCT I-STAT CREATININE
Creatinine, Ser: 0.7
Operator id: 151321

## 2011-05-08 LAB — HEMOGLOBIN AND HEMATOCRIT, BLOOD
HCT: 30.3 — ABNORMAL LOW
Hemoglobin: 10.4 — ABNORMAL LOW

## 2011-05-08 LAB — BASIC METABOLIC PANEL
BUN: 11
CO2: 25
Calcium: 8.1 — ABNORMAL LOW
Calcium: 9.6
Creatinine, Ser: 0.71
GFR calc Af Amer: >60
GFR calc non Af Amer: >60
GFR calc non Af Amer: >60
Glucose, Bld: 148 — ABNORMAL HIGH
Glucose, Bld: 248 — ABNORMAL HIGH
Potassium: 3.4 — ABNORMAL LOW
Potassium: 3.8
Sodium: 131 — ABNORMAL LOW
Sodium: 135

## 2011-05-08 LAB — DIFFERENTIAL
Basophils Absolute: 0
Basophils Relative: 0
Basophils Relative: 1
Lymphs Abs: 2.8
Monocytes Relative: 5
Monocytes Relative: 7
Neutro Abs: 11.3 — ABNORMAL HIGH
Neutro Abs: 9.4 — ABNORMAL HIGH
Neutrophils Relative %: 71
Neutrophils Relative %: 87 — ABNORMAL HIGH

## 2011-05-08 LAB — CBC
Hemoglobin: 10.4 — ABNORMAL LOW
Hemoglobin: 11.7 — ABNORMAL LOW
MCHC: 33.9
MCHC: 33.9
MCHC: 34.3
MCV: 90.5
RBC: 3.2 — ABNORMAL LOW
RBC: 3.39 — ABNORMAL LOW
RBC: 3.61 — ABNORMAL LOW
RDW: 13.6
RDW: 13.6
WBC: 13.3 — ABNORMAL HIGH

## 2011-05-08 LAB — URINALYSIS, ROUTINE W REFLEX MICROSCOPIC
Bilirubin Urine: NEGATIVE
Ketones, ur: NEGATIVE
Nitrite: NEGATIVE
Protein, ur: NEGATIVE
Urobilinogen, UA: 0.2

## 2011-05-16 ENCOUNTER — Other Ambulatory Visit (HOSPITAL_COMMUNITY): Payer: Self-pay | Admitting: Orthopedic Surgery

## 2011-05-16 DIAGNOSIS — M75101 Unspecified rotator cuff tear or rupture of right shoulder, not specified as traumatic: Secondary | ICD-10-CM

## 2011-05-22 ENCOUNTER — Ambulatory Visit (HOSPITAL_COMMUNITY)
Admission: RE | Admit: 2011-05-22 | Discharge: 2011-05-22 | Disposition: A | Discharge status: Home / Self Care | Payer: Managed Care, Other (non HMO) | Source: Ambulatory Visit | Attending: Orthopedic Surgery | Admitting: Orthopedic Surgery

## 2011-05-22 DIAGNOSIS — M67919 Unspecified disorder of synovium and tendon, unspecified shoulder: Secondary | ICD-10-CM | POA: Insufficient documentation

## 2011-05-22 DIAGNOSIS — M719 Bursopathy, unspecified: Secondary | ICD-10-CM | POA: Insufficient documentation

## 2011-05-22 DIAGNOSIS — M75101 Unspecified rotator cuff tear or rupture of right shoulder, not specified as traumatic: Secondary | ICD-10-CM

## 2011-05-22 DIAGNOSIS — M25519 Pain in unspecified shoulder: Secondary | ICD-10-CM | POA: Insufficient documentation

## 2011-06-19 ENCOUNTER — Other Ambulatory Visit: Payer: Managed Care, Other (non HMO)

## 2011-06-27 ENCOUNTER — Other Ambulatory Visit: Payer: Self-pay | Admitting: Orthopedic Surgery

## 2011-06-28 ENCOUNTER — Ambulatory Visit (INDEPENDENT_AMBULATORY_CARE_PROVIDER_SITE_OTHER): Payer: Managed Care, Other (non HMO) | Admitting: Internal Medicine

## 2011-06-28 ENCOUNTER — Encounter (HOSPITAL_BASED_OUTPATIENT_CLINIC_OR_DEPARTMENT_OTHER): Payer: Self-pay | Admitting: *Deleted

## 2011-06-28 ENCOUNTER — Encounter: Payer: Self-pay | Admitting: Internal Medicine

## 2011-06-28 VITALS — BP 150/84 | HR 76 | Temp 98.2°F | Resp 16 | Ht 62.5 in | Wt 174.0 lb

## 2011-06-28 DIAGNOSIS — I10 Essential (primary) hypertension: Secondary | ICD-10-CM

## 2011-06-28 DIAGNOSIS — R635 Abnormal weight gain: Secondary | ICD-10-CM

## 2011-06-28 DIAGNOSIS — E039 Hypothyroidism, unspecified: Secondary | ICD-10-CM

## 2011-06-28 MED ORDER — BISOPROLOL-HYDROCHLOROTHIAZIDE 2.5-6.25 MG PO TABS: 1.0000 | ORAL_TABLET | Freq: Every day | ORAL | Status: DC

## 2011-06-28 NOTE — Pre-Procedure Instructions (Addendum)
To come for BMET and EKG  Has not had sleep study since losing weight after gastric bypass; no current CPAP use

## 2011-06-28 NOTE — Progress Notes (Signed)
Subjective:    Patient ID: Alexandra [name], female    DOB: [birth year], 50 y.o.   MRN: [id number]  HPI History presents for monitoring of multiple medical problems including hypertension hyperlipidemia and hypothyroidism.  She is status post bariatric surgery status and had gained weight but she has resumed her diet program and has lost an additional 20 pounds.  Blood pressure however he did not seem to respond to weight loss blood pressure elevations complicated by the fact that she's having increasing shoulder pain and is scheduled for shoulder intervention.     Review of Systems  Constitutional: Negative for activity change, appetite change and fatigue.  HENT: Negative for ear pain, congestion, neck pain, postnasal drip and sinus pressure.   Eyes: Negative for redness and visual disturbance.  Respiratory: Negative for cough, shortness of breath and wheezing.   Gastrointestinal: Negative for abdominal pain and abdominal distention.  Genitourinary: Negative for dysuria, frequency and menstrual problem.  Musculoskeletal: Negative for myalgias, joint swelling and arthralgias.  Skin: Negative for rash and wound.  Neurological: Negative for dizziness, weakness and headaches.  Hematological: Negative for adenopathy. Does not bruise/bleed easily.  Psychiatric/Behavioral: Negative for sleep disturbance and decreased concentration.   Past Medical History  Diagnosis Date  . Diabetes mellitus   . Chronic back pain   . Hypertension   . Thyroid disease   . Hyperlipidemia     History   Social History  . Marital Status: Married    Spouse Name: N/A    Number of Children: N/A  . Years of Education: N/A   Occupational History  . Not on file.   Social History Main Topics  . Smoking status: Never Smoker   . Smokeless tobacco: Not on file  . Alcohol Use: No  . Drug Use: No  . Sexually Active: Not on file   Other Topics Concern  . Not on file   Social History Narrative  . No  narrative on file    Past Surgical History  Procedure Date  . Cholecystectomy   . Abdominal hysterectomy   . Bariatric surgery   . Back surgery   . Tubal ligation   . Cardiac catheterization     No family history on file.  Allergies  Allergen Reactions  . Ace Inhibitors     REACTION: cough  . Oxycodone-Acetaminophen     REACTION: rash  . Shrimp Flavor     Current Outpatient Prescriptions on File Prior to Visit  Medication Sig Dispense Refill  . 1st Choice Lancets Thin MISC by Does not apply route daily.        Marland Kitchen levothyroxine (SYNTHROID, LEVOTHROID) 150 MCG tablet Take 1 tablet (150 mcg total) by mouth daily.  90 tablet  3  . Multiple Vitamin (MULTIVITAMIN) capsule Take 1 capsule by mouth daily.        Marland Kitchen omeprazole (PRILOSEC) 20 MG capsule Take 1 capsule (20 mg total) by mouth daily.  90 capsule  3    BP 150/84  Pulse 76  Temp 98.2 F (36.8 C)  Resp 16  Ht 5' 2.5" (1.588 m)  Wt 174 lb (78.926 kg)  BMI 31.32 kg/m2       Objective:   Physical Exam  Constitutional: She is oriented to person, place, and time. She appears well-developed and well-nourished. No distress.  HENT:  Head: Normocephalic and atraumatic.  Right Ear: External ear normal.  Left Ear: External ear normal.  Nose: Nose normal.  Mouth/Throat: Oropharynx is clear and moist.  Eyes: Conjunctivae and EOM are normal. Pupils are equal, round, and reactive to light.  Neck: Normal range of motion. Neck supple. No JVD present. No tracheal deviation present. No thyromegaly present.  Cardiovascular: Normal rate, regular rhythm, normal heart sounds and intact distal pulses.   No murmur heard. Pulmonary/Chest: Effort normal and breath sounds normal. She has no wheezes. She exhibits no tenderness.  Abdominal: Soft. Bowel sounds are normal.  Musculoskeletal: She exhibits edema and tenderness.  Lymphadenopathy:    She has no cervical adenopathy.  Neurological: She is alert and oriented to person, place, and  time. She has normal reflexes. No cranial nerve deficit.  Skin: Skin is warm and dry. She is not diaphoretic.  Psychiatric: She has a normal mood and affect. Her behavior is normal.          Assessment & Plan:  Patient's blood pressure is elevated today we'll begin an intervention with a beta blocker and hydrochlorothiazide combination. I sent to her local pharmacy will followup in 3 months time her cholesterol is at goal with a total cholesterol triglycerides HDL and LDL all within range her TSH is normal.  I am very thankful that she has turned to the weight gain around and has lost 20 pounds I believe in the long term she may be able to get off blood pressure meds

## 2011-06-28 NOTE — Patient Instructions (Signed)
The patient is instructed to continue all medications as prescribed. Schedule followup with check out clerk upon leaving the clinic  

## 2011-07-01 ENCOUNTER — Encounter (HOSPITAL_BASED_OUTPATIENT_CLINIC_OR_DEPARTMENT_OTHER)
Admission: RE | Admit: 2011-07-01 | Discharge: 2011-07-01 | Disposition: A | Discharge status: Home / Self Care | Payer: Managed Care, Other (non HMO) | Source: Ambulatory Visit | Attending: Orthopedic Surgery | Admitting: Orthopedic Surgery

## 2011-07-01 ENCOUNTER — Other Ambulatory Visit: Payer: Self-pay

## 2011-07-01 NOTE — H&P (Signed)
  Alexandra [name] is an 50 y.o. female.   Chief Complaint: c/o chronic and progressive right shoulder pain. HpI: pt is a 50 y/o right handed female who previously underwent right  shoulder RC repair,and has had persistent right shoulder pain.  Past Medical History  Diagnosis Date  . GERD (gastroesophageal reflux disease)     prn med.  . Hypothyroidism   . Hypertension     new dx. - will start med. 06/28/2011  . Sleep apnea     prior to gastric bypass - no sleep study since bypass  . Rotator cuff tear, right     Past Surgical History  Procedure Date  . Cesarean section 1991  . Cholecystectomy 1989  . Shoulder arthroscopy distal clavicle excision and open rotator cuff repair 07/26/2010    right  . Shoulder arthroscopy 01/02/2006; 05/31/2004    left 2007; right 2005  . Exploratory laparotomy 05/08/2000    right salpingo-oophorectomy  . Abdominal hysterectomy 06/25/2000    left salpingo-oophorectomy  . 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 Laminotomy / excision disk posterior cervical spine 08/10/2009    C7-T1  . Thyroid surgery early 2000s    goiter  . Cardiac catheterization 04/01/2006    History reviewed. No pertinent family history. Social History:  reports that she has never smoked. She has never used smokeless tobacco. She reports that she does not drink alcohol or use illicit drugs.  Allergies:  Allergies  Allergen Reactions  . Shrimp Flavor Swelling    Swelling of tongue and rash  . Ace Inhibitors Cough  . Oxycodone-Acetaminophen Rash    No current facility-administered medications on file as of .   Medications Prior to Admission  Medication Sig Dispense Refill  . levothyroxine (SYNTHROID, LEVOTHROID) 150 MCG tablet Take 1 tablet (150 mcg total) by mouth daily.  90 tablet  3  . omeprazole (PRILOSEC) 20 MG capsule Take 1 capsule (20 mg total) by mouth daily.  90 capsule  3    No results found for this or any previous visit (from the past 48 hour(s)).  No  results found.   Pertinent items are noted in HPI.  Height 5' 2.5" (1.588 m), weight 78.472 kg (173 lb).  General appearance: alert Head: Normocephalic, without obvious abnormality Neck: supple, symmetrical, trachea midline Resp: clear to auscultation bilaterally Cardio: regular rate and rhythm, S1, S2 normal, no murmur, click, rub or gallop GI: normal findings: bowel sounds normal Extremities:ultrasound of her right rotator cuff completed by Geanie Cooley on 05-22-11. Dr. Jena Gauss identified a small recurrent full thickness tear of the posterior supraspinatus.   Alexandra [name] had a very large rotator cuff tear that was repaired with open technique on 07-26-10. Despite repair with fiber tape, she has developed a partial tear that is full thickness involving the posterior supraspinatus. This is the source of her chronic pain.   On exam at this time she is strong on internal/externt has weakness of abduction and scaption.   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  Plan: To OR for Right SA with repair recurrent supraspinatus tear.  Dyesha Henault JR,Daryus Sowash V 07/01/2011, 9:21 PM

## 2011-07-01 NOTE — H&P (Signed)
RE: Alexandra [name]     Seen by: Katy Fitch. Naaman Plummer., MD   OFFICE VISIT    05-15-11   DOB 2060-11-23  Alexandra [name] returns for follow up evaluation of her right shoulder rotator cuff reconstruction. 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 She has had a rotator cuff repair but continues to have pain beneath the lateral deltoid and anterior pain. The anterior pain I interpreted to be arthritis. Her lateral pain may be from the tendon.   She is sent to see Dr. Jena Gauss for an ultrasound of her right rotator cuff to be certain we have a technically satisfactory repair of the rotator cuff. I will see her back to interpret her images in 2 weeks. She has been on Meloxicam 7.5 mg PO b.i.d. This helped for a week but has not provided long term pain relief. We may consider a direct injection into the glenohumeral joint as a trial to discern pain from tendon origin vs joint origin.  RVS/phe June 03, 2011   Darryll Capers, MD Fax # [phone]  RE: Alexandra [name] DOB: 2060-11-23  Dear [name]:  [name] returns to review the ultrasound of her right rotator cuff completed by Geanie Cooley on 05-22-11. Dr. Jena Gauss identified a small recurrent full thickness tear of the posterior supraspinatus.   [name] had a very large rotator cuff tear that was repaired with open technique on 07-26-10. Despite repair with fiber tape, she has developed a partial tear that is full thickness involving the posterior supraspinatus. This is the source of her chronic pain.   On exam at this time she is strong on internal/external rotation but has weakness of abduction and scaption.   Given this circumstance, she is not likely to be relieved of her discomfort unless we proceed with proper repair of her rotator cuff. We will schedule this at a mutually convenient time in the near future. We will perform diagnostic arthroscopy followed by open repair of the rotator  cuff. The surgery, after care, risks and benefits were described in detail. Questions regarding the surgery were invited and answered in detail.    Thank you for allowing me to participate in the care of your patients.   Best regards,    Katy Fitch. Naaman Plummer., MD RVS/phe T: 06-05-11

## 2011-07-02 ENCOUNTER — Encounter (HOSPITAL_BASED_OUTPATIENT_CLINIC_OR_DEPARTMENT_OTHER): Payer: Self-pay

## 2011-07-02 ENCOUNTER — Ambulatory Visit (HOSPITAL_BASED_OUTPATIENT_CLINIC_OR_DEPARTMENT_OTHER)
Admission: RE | Admit: 2011-07-02 | Discharge: 2011-07-02 | Disposition: A | Discharge status: Home / Self Care | Payer: Managed Care, Other (non HMO) | Source: Ambulatory Visit | Attending: Orthopedic Surgery | Admitting: Orthopedic Surgery

## 2011-07-02 ENCOUNTER — Encounter (HOSPITAL_BASED_OUTPATIENT_CLINIC_OR_DEPARTMENT_OTHER): Payer: Self-pay | Admitting: Anesthesiology

## 2011-07-02 ENCOUNTER — Encounter (HOSPITAL_BASED_OUTPATIENT_CLINIC_OR_DEPARTMENT_OTHER): Payer: Self-pay | Admitting: Orthopedic Surgery

## 2011-07-02 ENCOUNTER — Encounter (HOSPITAL_BASED_OUTPATIENT_CLINIC_OR_DEPARTMENT_OTHER)
Admission: RE | Disposition: A | Discharge status: Home / Self Care | Payer: Self-pay | Source: Ambulatory Visit | Attending: Orthopedic Surgery

## 2011-07-02 ENCOUNTER — Ambulatory Visit (HOSPITAL_BASED_OUTPATIENT_CLINIC_OR_DEPARTMENT_OTHER): Payer: Managed Care, Other (non HMO) | Admitting: Anesthesiology

## 2011-07-02 DIAGNOSIS — Z5333 Arthroscopic surgical procedure converted to open procedure: Secondary | ICD-10-CM | POA: Insufficient documentation

## 2011-07-02 DIAGNOSIS — Z01812 Encounter for preprocedural laboratory examination: Secondary | ICD-10-CM | POA: Insufficient documentation

## 2011-07-02 DIAGNOSIS — I10 Essential (primary) hypertension: Secondary | ICD-10-CM | POA: Insufficient documentation

## 2011-07-02 DIAGNOSIS — M75120 Complete rotator cuff tear or rupture of unspecified shoulder, not specified as traumatic: Secondary | ICD-10-CM | POA: Insufficient documentation

## 2011-07-02 DIAGNOSIS — E039 Hypothyroidism, unspecified: Secondary | ICD-10-CM | POA: Insufficient documentation

## 2011-07-02 DIAGNOSIS — M25819 Other specified joint disorders, unspecified shoulder: Secondary | ICD-10-CM | POA: Insufficient documentation

## 2011-07-02 DIAGNOSIS — G4730 Sleep apnea, unspecified: Secondary | ICD-10-CM | POA: Insufficient documentation

## 2011-07-02 DIAGNOSIS — M66329 Spontaneous rupture of flexor tendons, unspecified upper arm: Secondary | ICD-10-CM | POA: Insufficient documentation

## 2011-07-02 DIAGNOSIS — E669 Obesity, unspecified: Secondary | ICD-10-CM | POA: Insufficient documentation

## 2011-07-02 DIAGNOSIS — M9420 Chondromalacia, unspecified site: Secondary | ICD-10-CM | POA: Insufficient documentation

## 2011-07-02 DIAGNOSIS — K219 Gastro-esophageal reflux disease without esophagitis: Secondary | ICD-10-CM | POA: Insufficient documentation

## 2011-07-02 HISTORY — DX: Hypothyroidism, unspecified: E03.9

## 2011-07-02 HISTORY — DX: Sleep apnea, unspecified: G47.30

## 2011-07-02 HISTORY — PX: SHOULDER ARTHROSCOPY: SHX128

## 2011-07-02 HISTORY — DX: Unspecified rotator cuff tear or rupture of right shoulder, not specified as traumatic: M75.101

## 2011-07-02 HISTORY — DX: Gastro-esophageal reflux disease without esophagitis: K21.9

## 2011-07-02 LAB — POCT HEMOGLOBIN-HEMACUE: Hemoglobin: 14 g/dL (ref 12.0–15.0)

## 2011-07-02 SURGERY — ARTHROSCOPY, SHOULDER
Anesthesia: General | Site: Shoulder | Laterality: Right | Wound class: Clean

## 2011-07-02 MED ORDER — CEPHALEXIN 500 MG PO CAPS: 500.0000 mg | ORAL_CAPSULE | Freq: Three times a day (TID) | ORAL | Status: AC

## 2011-07-02 MED ORDER — PROMETHAZINE HCL 25 MG/ML IJ SOLN: 6.2500 mg | INTRAMUSCULAR | Status: DC | PRN

## 2011-07-02 MED ORDER — FENTANYL CITRATE 0.05 MG/ML IJ SOLN
50.0000 ug | INTRAMUSCULAR | Status: DC | PRN
  Administered 2011-07-02: 100 ug via INTRAVENOUS

## 2011-07-02 MED ORDER — LIDOCAINE HCL (CARDIAC) 20 MG/ML IV SOLN
INTRAVENOUS | Status: DC | PRN
  Administered 2011-07-02: 40 mg via INTRAVENOUS

## 2011-07-02 MED ORDER — SUCCINYLCHOLINE CHLORIDE 20 MG/ML IJ SOLN
INTRAMUSCULAR | Status: DC | PRN
  Administered 2011-07-02: 160 mg via INTRAVENOUS

## 2011-07-02 MED ORDER — CHLORHEXIDINE GLUCONATE 4 % EX LIQD: 60.0000 mL | Freq: Once | CUTANEOUS | Status: DC

## 2011-07-02 MED ORDER — PROPOFOL 10 MG/ML IV EMUL
INTRAVENOUS | Status: DC | PRN
  Administered 2011-07-02: 200 mg via INTRAVENOUS

## 2011-07-02 MED ORDER — HYDROMORPHONE HCL PF 1 MG/ML IJ SOLN
0.2500 mg | INTRAMUSCULAR | Status: DC | PRN
  Administered 2011-07-02 (×3): 0.5 mg via INTRAVENOUS

## 2011-07-02 MED ORDER — MIDAZOLAM HCL 2 MG/2ML IJ SOLN
1.0000 mg | INTRAMUSCULAR | Status: DC | PRN
  Administered 2011-07-02: 2 mg via INTRAVENOUS

## 2011-07-02 MED ORDER — FENTANYL CITRATE 0.05 MG/ML IJ SOLN
INTRAMUSCULAR | Status: DC | PRN
  Administered 2011-07-02: 75 ug via INTRAVENOUS

## 2011-07-02 MED ORDER — CEFAZOLIN SODIUM 1-5 GM-% IV SOLN: 1.0000 g | Freq: Once | INTRAVENOUS | Status: DC

## 2011-07-02 MED ORDER — EPHEDRINE SULFATE 50 MG/ML IJ SOLN
INTRAMUSCULAR | Status: DC | PRN
  Administered 2011-07-02: 15 mg via INTRAVENOUS

## 2011-07-02 MED ORDER — ONDANSETRON HCL 4 MG/2ML IJ SOLN
INTRAMUSCULAR | Status: DC | PRN
  Administered 2011-07-02: 4 mg via INTRAVENOUS

## 2011-07-02 MED ORDER — LACTATED RINGERS IV SOLN
INTRAVENOUS | Status: DC
  Administered 2011-07-02 (×2): via INTRAVENOUS

## 2011-07-02 MED ORDER — BUPIVACAINE-EPINEPHRINE PF 0.5-1:200000 % IJ SOLN
INTRAMUSCULAR | Status: DC | PRN
  Administered 2011-07-02: 25 mL

## 2011-07-02 MED ORDER — IBUPROFEN 600 MG PO TABS: 600.0000 mg | ORAL_TABLET | Freq: Four times a day (QID) | ORAL | Status: AC | PRN

## 2011-07-02 MED ORDER — HYDROMORPHONE HCL 2 MG PO TABS: ORAL_TABLET | ORAL | Status: AC

## 2011-07-02 MED ORDER — ACETAMINOPHEN 10 MG/ML IV SOLN
1000.0000 mg | Freq: Once | INTRAVENOUS | Status: AC
  Administered 2011-07-02 (×2): 1000 mg via INTRAVENOUS

## 2011-07-02 MED ORDER — DEXAMETHASONE SODIUM PHOSPHATE 4 MG/ML IJ SOLN
INTRAMUSCULAR | Status: DC | PRN
  Administered 2011-07-02: 8 mg via INTRAVENOUS

## 2011-07-02 SURGICAL SUPPLY — 83 items
ANCHOR SUT BIO SW 4.75X19.1 (Anchor) ×2 IMPLANT
BANDAGE ADHESIVE 1X3 (GAUZE/BANDAGES/DRESSINGS) IMPLANT
BLADE AVERAGE 25X9 (BLADE) IMPLANT
BLADE CUTTER MENIS 5.5 (BLADE) IMPLANT
BLADE SURG 15 STRL LF DISP TIS (BLADE) ×2 IMPLANT
BLADE SURG 15 STRL SS (BLADE) ×2
BLADE VORTEX 6.0 (BLADE) IMPLANT
BUR EGG/OVAL CARBIDE (BURR) IMPLANT
CANISTER OMNI JUG 16 LITER (MISCELLANEOUS) ×2 IMPLANT
CANISTER SUCTION 2500CC (MISCELLANEOUS) ×2 IMPLANT
CANNULA 5.75X7 CRYSTAL CLEAR (CANNULA) IMPLANT
CANNULA SHOULDER 7CM (CANNULA) IMPLANT
CANNULA TWIST IN 8.25X7CM (CANNULA) IMPLANT
CATH ROBINSON RED A/P 14FR (CATHETERS) ×2 IMPLANT
CLEANER CAUTERY TIP 5X5 PAD (MISCELLANEOUS) IMPLANT
CLOTH BEACON ORANGE TIMEOUT ST (SAFETY) ×2 IMPLANT
CUTTER MENISCUS  4.2MM (BLADE) ×1
CUTTER MENISCUS 4.2MM (BLADE) ×1 IMPLANT
DECANTER SPIKE VIAL GLASS SM (MISCELLANEOUS) IMPLANT
DRAPE INCISE IOBAN 66X45 STRL (DRAPES) ×2 IMPLANT
DRAPE STERI 35X30 U-POUCH (DRAPES) ×2 IMPLANT
DRAPE SURG 17X23 STRL (DRAPES) ×2 IMPLANT
DRAPE U-SHAPE 47X51 STRL (DRAPES) ×2 IMPLANT
DRAPE U-SHAPE 76X120 STRL (DRAPES) ×4 IMPLANT
DRSG PAD ABDOMINAL 8X10 ST (GAUZE/BANDAGES/DRESSINGS) ×2 IMPLANT
DURAPREP 26ML APPLICATOR (WOUND CARE) ×2 IMPLANT
ELECT REM PT RETURN 9FT ADLT (ELECTROSURGICAL) ×2
ELECTRODE REM PT RTRN 9FT ADLT (ELECTROSURGICAL) ×1 IMPLANT
FIBERTAPE #2 ×2 IMPLANT
GLOVE BIOGEL M STRL SZ7.5 (GLOVE) ×2 IMPLANT
GLOVE BIOGEL PI IND STRL 8 (GLOVE) ×2 IMPLANT
GLOVE BIOGEL PI INDICATOR 8 (GLOVE) ×2
GLOVE ORTHO TXT STRL SZ7.5 (GLOVE) ×2 IMPLANT
GOWN BRE IMP PREV XXLGXLNG (GOWN DISPOSABLE) ×2 IMPLANT
GOWN PREVENTION PLUS XLARGE (GOWN DISPOSABLE) ×4 IMPLANT
GOWN PREVENTION PLUS XXLARGE (GOWN DISPOSABLE) IMPLANT
GOWN STRL REIN 2XL XLG LVL4 (GOWN DISPOSABLE) ×2 IMPLANT
NDL SUT 6 .5 CRC .975X.05 MAYO (NEEDLE) ×1 IMPLANT
NEEDLE MAYO TAPER (NEEDLE) ×1
NEEDLE MINI RC 24MM (NEEDLE) IMPLANT
NEEDLE SCORPION (NEEDLE) ×2 IMPLANT
PACK ARTHROSCOPY DSU (CUSTOM PROCEDURE TRAY) ×2 IMPLANT
PACK BASIN DAY SURGERY FS (CUSTOM PROCEDURE TRAY) ×2 IMPLANT
PAD CLEANER CAUTERY TIP 5X5 (MISCELLANEOUS)
PASSER SUT SWANSON 36MM LOOP (INSTRUMENTS) IMPLANT
PENCIL BUTTON HOLSTER BLD 10FT (ELECTRODE) IMPLANT
RESECTOR FULL RADIUS 4.2MM (BLADE) IMPLANT
RESECTOR FULL RADIUS 4.8MM (BLADE) IMPLANT
SLEEVE SCD COMPRESS KNEE MED (MISCELLANEOUS) ×2 IMPLANT
SLING ARM FOAM STRAP LRG (SOFTGOODS) ×2 IMPLANT
SLING ARM FOAM STRAP MED (SOFTGOODS) IMPLANT
SPONGE GAUZE 4X4 12PLY (GAUZE/BANDAGES/DRESSINGS) ×2 IMPLANT
SPONGE LAP 4X18 X RAY DECT (DISPOSABLE) ×2 IMPLANT
STRIP CLOSURE SKIN 1/2X4 (GAUZE/BANDAGES/DRESSINGS) ×2 IMPLANT
SUCTION FRAZIER TIP 10 FR DISP (SUCTIONS) IMPLANT
SUT ETHIBOND 2 OS 4 DA (SUTURE) IMPLANT
SUT ETHILON 4 0 PS 2 18 (SUTURE) IMPLANT
SUT FIBERWIRE #2 38 T-5 BLUE (SUTURE) ×2
SUT FIBERWIRE 3-0 18 TAPR NDL (SUTURE)
SUT PROLENE 1 CT (SUTURE) ×2 IMPLANT
SUT PROLENE 3 0 PS 2 (SUTURE) ×2 IMPLANT
SUT TIGER TAPE 7 IN WHITE (SUTURE) IMPLANT
SUT VIC AB 0 CT1 27 (SUTURE)
SUT VIC AB 0 CT1 27XBRD ANBCTR (SUTURE) IMPLANT
SUT VIC AB 0 SH 27 (SUTURE) ×2 IMPLANT
SUT VIC AB 2-0 SH 27 (SUTURE)
SUT VIC AB 2-0 SH 27XBRD (SUTURE) IMPLANT
SUT VIC AB 3-0 SH 27 (SUTURE)
SUT VIC AB 3-0 SH 27X BRD (SUTURE) IMPLANT
SUT VIC AB 3-0 X1 27 (SUTURE) IMPLANT
SUTURE FIBERWR #2 38 T-5 BLUE (SUTURE) ×1 IMPLANT
SUTURE FIBERWR 3-0 18 TAPR NDL (SUTURE) IMPLANT
SYR 3ML 23GX1 SAFETY (SYRINGE) IMPLANT
SYR BULB 3OZ (MISCELLANEOUS) IMPLANT
TAPE FIBER 2MM 7IN #2 BLUE (SUTURE) IMPLANT
TAPE PAPER 3X10 WHT MICROPORE (GAUZE/BANDAGES/DRESSINGS) ×2 IMPLANT
TOWEL OR 17X24 6PK STRL BLUE (TOWEL DISPOSABLE) ×2 IMPLANT
TRAY FOLEY CATH 14FR (SET/KITS/TRAYS/PACK) ×2 IMPLANT
TUBE CONNECTING 20X1/4 (TUBING) ×2 IMPLANT
TUBING ARTHROSCOPY IRRIG 16FT (MISCELLANEOUS) IMPLANT
WAND STAR VAC 90 (SURGICAL WAND) ×2 IMPLANT
WATER STERILE IRR 1000ML POUR (IV SOLUTION) ×2 IMPLANT
YANKAUER SUCT BULB TIP NO VENT (SUCTIONS) IMPLANT

## 2011-07-02 NOTE — Op Note (Signed)
Alexandra [name], Alexandra [name]              ACCOUNT NO.:  [id number]  MEDICAL RECORD NO.:  [id number]  LOCATION:                                 FACILITY:  PHYSICIAN:  Katy Fitch. Lithzy Bernard, M.D. DATE OF BIRTH:  1961/01/06  DATE OF PROCEDURE:  07/02/2011 DATE OF DISCHARGE:                              OPERATIVE REPORT   PREOPERATIVE DIAGNOSIS:  Ultrasound documented full-thickness rotator cuff tear involving supraspinatus status post prior extensive rotator cuff reconstruction performed July 26, 2010.  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  INDICATIONS:  Alexandra [name] is a 50 year old woman who has been under our care for the past year for right shoulder pain.  Clinical examination and imaging revealed a severe PASTA lesion with unfavorable AC anatomy.  She was brought to the operating room on July 26, 2010, and underwent diagnostic arthroscopy with glenohumeral debridement, followed by subacromial decompression, distal clavicle resection, and open repair of a 100% PASTA lesion of her supraspinatus with a medial swivel lock anchor and a lateral swivel lock anchor and 2 mattress sutures medially brought to a lateral swivel lock for compression.  A very satisfactory repair was achieved.  Amerah continued to a standard postoperative rehab protocol for 4 months following her surgery and for a period of time had excellent function.  During the summer of 2012, she began to experience recurrent shoulder pain.  We reviewed her plain films and MRI and did note that she had some background glenohumeral degenerative arthritis.  Initial treatment for glenohumeral arthritis was nonsuccessful.  We subsequently sent her for an  ultrasound of her shoulder with Dr. Jena Gauss, who documented a full-thickness limited partially retracted supraspinatus rotator cuff tear adjacent to repair site.  Alexandra [name]'s pain was disabling.  She requested that we proceed with re-examination at this time for appropriate repairs.  Preoperatively, we reminded her that we had no spare parts.  We pointed out that we would repair her tendon to her greater tuberosity.  At times after age 61, individuals have poor circulation in the watershed area of the rotator cuff, and degenerative tears/necrotic tears can occur that are very challenging to repair.  After informed consent, she is brought back to the operating room at this time for a detailed evaluation of her shoulder, followed by probable repair of the rotator cuff with open technique.  Questions regarding the anticipated procedure were invited and answered in detail preoperatively.  Preoperatively, she was interviewed by Dr. Gypsy Balsam of Anesthesia.  General anesthesia by endotracheal technique was recommended and accepted as well as a supplemental plexus block for perioperative comfort.  After informed consent, she is brought to room 6 of the [facility].  PROCEDURE:  [name] was placed in supine position upon the operating table where under Dr. Burnett Corrente direct supervision using a glide scope, general endotracheal anesthesia was induced.  She was carefully positioned in the beach-chair position with aid of a torso and head holder designed for shoulder arthroscopy.  Ancef 1 g  was administered as an IV prophylactic antibiotic.  The right upper extremity and forequarter were prepped with DuraPrep and draped with impervious arthroscopy drapes.  After routine surgical time- out, the arthroscope was placed with standard posterior viewing portal with a blunt technique utilizing a switching stick for primary entry into the joint.  Diagnostic arthroscopy immediately revealed  the prior repair site with the swivel lock anchor and FiberTapes in seemingly satisfactory position just posterior to the long head of the biceps. There was no evidence of a full-thickness tear of the rotator cuff on joint inspection.  The chondromalacia of the glenoid and humeral head were well visualized and documented with a digital camera.  There was degenerative fraying of the anterior labrum from 3 o'clock anteriorly to 6 o'clock inferiorly, and a 4.2-mm suction shaver was brought in anteriorly to perform a thorough debridement of this region.  The labrum was tested and found to be otherwise stable.  The long head of the biceps was stable at the superior glenoid and was frayed superficially at the rotator interval.  After completion of glenohumeral debridement, we used a spinal needle to place a Prolene suture at the site of the rotator cuff repair so that we could carefully inspect this during our bursal scope examination.  The Prolene was placed without difficulty, followed by placement of the arthroscope in the posterior portal into the subacromial and subdeltoid space.  The Prolene suture was easily visualized.  The subacromial space had minimal adhesions.  There was a very smooth reformed bursa.  The distal clavicle was noted to have a slight square edge, therefore, a suction shaver was brought in anteriorly, and this was beveled on a 40-degree angle.  The coracoacromial ligament had reformed.  The most notable finding was an ulcerated area of the rotator cuff, about 1 cm medial and anterior to the site of the repair with the FiberTape sutures documented by the position of the Prolene suture.  The tear was necrotic mid substance and might have been due to a FiberTape pulling through the poorly vascularized tendon.  This was documented with the digital camera, and will be a rather significant challenge to repair.  After limited subacromial debridement and hemostasis, the  arthroscopic equipment was removed, followed by excision of the prior surgical scar, and splitting of the anterior middle third of the deltoid to reveal the tear.  The tear was freshened with a suction shaver, and a FiberTape suture was placed with locking grasping suture in such a manner that the horizontal sutures were used to reinforce the vertical suture that would go to the swivel lock laterally.  Good closure of the freshened tendon margins was achieved, followed by placement of a 4.75 mm swivel lock posterior to the position of the long head of the biceps to smooth and inset the repair.  Prior to placing the swivel lock, the arthroscope was replaced in the glenohumeral joint, and we confirmed that the FiberTape was not fouling the long head of biceps.  A very satisfactory construct had been achieved.  After completion of the swivel lock repair, the subacromial space was irrigated followed by repair of the deltoid split with an apical suture of #2 FiberWire and figure-of-eight suture of 0 Vicryl.  The skin was repaired with 0 Vicryl and subcutaneous 3-0 Vicryl and intradermal 3-0 Prolene.  There were no apparent complications.  Ms. Tarman tolerated the surgery and anesthesia well.  She was placed in a sling, awakened from general anesthesia, and transferred to  the recovery room with stable vital signs.  She will be discharged with prescriptions for Dilaudid 2 mg 1 or 2 tablets p.o. q.4-6 hours p.r.n. pain, 30 tablets without refill, Keflex 500 mg 1 p.o. q.8 hours x3 days, and Motrin 600 mg 1 p.o. q.6 h. p.r.n. pain, 30 tablets with 1 refill.  We will see her back in followup in our office in 3 days.  Should she have significant discomfort, we will admit her to the recovery care center for observation of her vital signs.  We will make this determination in the holding area.     Katy Fitch Mignonne Afonso, M.D.     RVS/MEDQ  D:  07/02/2011  T:  07/02/2011  Job:  [id number]

## 2011-07-02 NOTE — H&P (Signed)
  H&P documentation: 07/02/11  -History and Physical Reviewed  -Patient has been re-examined  -No change in the plan of care  Wyn Forster, MD

## 2011-07-02 NOTE — Anesthesia Postprocedure Evaluation (Signed)
  Anesthesia Post-op Note  Patient: Alexandra [name]  Procedure(s) Performed:  ARTHROSCOPY SHOULDER - repair supraspinatus, Debride glenohumeral joint, labrum  Patient Location: PACU  Anesthesia Type: GA combined with regional for post-op pain  Level of Consciousness: awake, alert  and oriented  Airway and Oxygen Therapy: Patient Spontanous Breathing  Post-op Pain: none  Post-op Assessment: Post-op Vital signs reviewed, Patient's Cardiovascular Status Stable, Respiratory Function Stable, Patent Airway, No signs of Nausea or vomiting, Adequate PO intake and Pain level controlled  Post-op Vital Signs: stable  Complications: No apparent anesthesia complications

## 2011-07-02 NOTE — Interval H&P Note (Signed)
History and Physical Interval Note:  07/02/2011 9:00 AM  Alexandra [name]  has presented today for surgery, with the diagnosis of rotator cuff tear on right  The various methods of treatment have been discussed with the patient and family. After consideration of risks, benefits and other options for treatment, the patient has consented to  Procedure(s): ARTHROSCOPY SHOULDER as a surgical intervention .  The patients' history has been reviewed, patient examined, no change in status, stable for surgery.  I have reviewed the patients' chart and labs.  Questions were answered to the patient's satisfaction.     Alexandra [name],Alexandra [name]  H&P documentation: 06/28/11  -History and Physical Reviewed  -Patient has been re-examined  -No change in the plan of care  Wyn Forster, MD   07/02/11

## 2011-07-02 NOTE — Anesthesia Preprocedure Evaluation (Addendum)
Anesthesia Evaluation  Patient identified by MRN, date of birth, ID band Patient awake    Reviewed: Allergy & Precautions, H&P , NPO status , Patient's Chart, lab work & pertinent test results  Airway Mallampati: III TM Distance: <3 FB Neck ROM: Full    Dental  (+) Teeth Intact,    Pulmonary sleep apnea ,    Pulmonary exam normal       Cardiovascular hypertension,     Neuro/Psych PSYCHIATRIC DISORDERS    GI/Hepatic GERD-  ,  Endo/Other  Hypothyroidism   Renal/GU      Musculoskeletal   Abdominal (+) obese,   Peds  Hematology   Anesthesia Other Findings   Reproductive/Obstetrics                         Anesthesia Physical Anesthesia Plan  ASA: III  Anesthesia Plan: General   Post-op Pain Management:    Induction: Intravenous  Airway Management Planned: Oral ETT and Video Laryngoscope Planned  Additional Equipment:   Intra-op Plan:   Post-operative Plan: Extubation in OR  Informed Consent: I have reviewed the patients History and Physical, chart, labs and discussed the procedure including the risks, benefits and alternatives for the proposed anesthesia with the patient or authorized representative who has indicated his/her understanding and acceptance.     Plan Discussed with: CRNA and Surgeon  Anesthesia Plan Comments:        Anesthesia Quick Evaluation

## 2011-07-02 NOTE — Op Note (Signed)
Op note dictated:  [id number] MRN:  [id number]

## 2011-07-02 NOTE — Transfer of Care (Signed)
Immediate Anesthesia Transfer of Care Note  Patient: Alexandra [name]  Procedure(s) Performed:  ARTHROSCOPY SHOULDER - repair supraspinatus, Debride glenohumeral joint, labrum  Patient Location: PACU  Anesthesia Type: GA combined with regional for post-op pain  Level of Consciousness: sedated and patient cooperative  Airway & Oxygen Therapy: Patient Spontanous Breathing and Patient connected to face mask oxygen  Post-op Assessment: Report given to PACU RN and Post -op Vital signs reviewed and stable  Post vital signs: Reviewed and stable  Complications: No apparent anesthesia complications

## 2011-07-02 NOTE — Anesthesia Procedure Notes (Addendum)
Anesthesia Regional Block:  Interscalene brachial plexus block  Pre-Anesthetic Checklist: ,, timeout performed, Correct Patient, Correct Site, Correct Laterality, Correct Procedure, Correct Position, site marked, Risks and benefits discussed,  Surgical consent,  Pre-op evaluation,  At surgeon's request and post-op pain management  Laterality: Right  Prep: chloraprep       Needles:  Injection technique: Single-shot  Needle Type: Stimulator Needle - 40      Needle Gauge: 20 and 20 G    Additional Needles:  Procedures: nerve stimulator Interscalene brachial plexus block  Nerve Stimulator or Paresthesia:  Response: 0.5 mA,   Additional Responses:   Narrative:  Start time: 07/02/2011 9:37 AM End time: 07/02/2011 9:49 AM  Performed by: Personally   Additional Notes: Pop Chg prep Sterile tech 22 g stim needle w/ stim down to .5ma No compl Dr Gypsy Balsam   Performed by: Radford Pax    Procedure Name: Intubation Date/Time: 07/02/2011 11:29 AM Performed by: Radford Pax Pre-anesthesia Checklist: Patient identified, Emergency Drugs available, Suction available, Patient being monitored and Timeout performed Patient Re-evaluated:Patient Re-evaluated prior to inductionOxygen Delivery Method: Circle System Utilized Preoxygenation: Pre-oxygenation with 100% oxygen Intubation Type: IV induction Ventilation: Mask ventilation without difficulty Laryngoscope Size: 3 Grade View: Grade I Tube type: Oral Tube size: 7.0 mm Number of attempts: 1 Airway Equipment and Method: stylet,  oral airway and video-laryngoscopy Placement Confirmation: ETT inserted through vocal cords under direct vision,  positive ETCO2 and breath sounds checked- equal and bilateral Secured at: 21 cm Tube secured with: Tape Dental Injury: Teeth and Oropharynx as per pre-operative assessment  Difficulty Due To: Difficulty was anticipated, Difficult Airway- due to large tongue, Difficult Airway- due to  reduced neck mobility, Difficult Airway- due to anterior larynx, Difficult Airway- due to limited oral opening and Difficult Airway- due to dentition

## 2011-07-02 NOTE — Brief Op Note (Signed)
07/02/2011  1:02 PM  PATIENT:  Tommy Rainwater  50 y.o. 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  PROCEDURE:  Procedure(s): ARTHROSCOPY SHOULDER, DEBRIDEMENT OF CHONDROMALACIA OF HUMERAL HEAD AND GLENOID, OPEN REPAIR OF NECROTIC MIS SUBSTANCE TEAR OF SUPRASPINATUS  SURGEON:  Surgeon(s): Wyn Forster., MD  PHYSICIAN ASSISTANT:   ASSISTANTS:Saylee Sherrill Dasnoit, P.A.-C}   ANESTHESIA:   general  EBL:  Total I/O In: 1000 [I.V.:1000] Out: 150 [Urine:150]  BLOOD ADMINISTERED:none  DRAINS: none   LOCAL MEDICATIONS USED:  NONE  SPECIMEN:  No Specimen  DISPOSITION OF SPECIMEN:  N/A  COUNTS:  YES  TOURNIQUET:  * No tourniquets in log *  DICTATION: .Other Dictation: Dictation Number [id number]  PLAN OF CARE: Admit for overnight observation  PATIENT DISPOSITION:  PACU - hemodynamically stable.

## 2011-07-05 ENCOUNTER — Encounter (HOSPITAL_BASED_OUTPATIENT_CLINIC_OR_DEPARTMENT_OTHER): Payer: Self-pay | Admitting: Orthopedic Surgery

## 2011-07-22 ENCOUNTER — Encounter (HOSPITAL_BASED_OUTPATIENT_CLINIC_OR_DEPARTMENT_OTHER): Payer: Self-pay | Admitting: *Deleted

## 2011-07-22 ENCOUNTER — Emergency Department (INDEPENDENT_AMBULATORY_CARE_PROVIDER_SITE_OTHER): Payer: Managed Care, Other (non HMO)

## 2011-07-22 ENCOUNTER — Emergency Department (HOSPITAL_BASED_OUTPATIENT_CLINIC_OR_DEPARTMENT_OTHER)
Admission: EM | Admit: 2011-07-22 | Discharge: 2011-07-22 | Disposition: A | Discharge status: Home / Self Care | Payer: Managed Care, Other (non HMO) | Attending: Emergency Medicine | Admitting: Emergency Medicine

## 2011-07-22 DIAGNOSIS — X58XXXA Exposure to other specified factors, initial encounter: Secondary | ICD-10-CM

## 2011-07-22 DIAGNOSIS — I10 Essential (primary) hypertension: Secondary | ICD-10-CM | POA: Insufficient documentation

## 2011-07-22 DIAGNOSIS — K219 Gastro-esophageal reflux disease without esophagitis: Secondary | ICD-10-CM | POA: Insufficient documentation

## 2011-07-22 DIAGNOSIS — E039 Hypothyroidism, unspecified: Secondary | ICD-10-CM | POA: Insufficient documentation

## 2011-07-22 DIAGNOSIS — M25549 Pain in joints of unspecified hand: Secondary | ICD-10-CM

## 2011-07-22 DIAGNOSIS — S43409A Unspecified sprain of unspecified shoulder joint, initial encounter: Secondary | ICD-10-CM

## 2011-07-22 DIAGNOSIS — G4730 Sleep apnea, unspecified: Secondary | ICD-10-CM | POA: Insufficient documentation

## 2011-07-22 DIAGNOSIS — IMO0002 Reserved for concepts with insufficient information to code with codable children: Secondary | ICD-10-CM | POA: Insufficient documentation

## 2011-07-22 MED ORDER — OXYCODONE-ACETAMINOPHEN 5-325 MG PO TABS: 1.0000 | ORAL_TABLET | ORAL | Status: AC | PRN

## 2011-07-22 MED ORDER — CYCLOBENZAPRINE HCL 10 MG PO TABS: 10.0000 mg | ORAL_TABLET | Freq: Two times a day (BID) | ORAL | Status: AC | PRN
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
## 2011-07-22 NOTE — ED Provider Notes (Signed)
History/physical exam/procedure(s) were performed by non-physician practitioner and as supervising physician I was immediately available for consultation/collaboration. I have reviewed all notes and am in agreement with care and plan.   Hilario Quarry, MD 07/22/11 [phone]

## 2011-09-16 ENCOUNTER — Other Ambulatory Visit (HOSPITAL_COMMUNITY): Payer: Self-pay | Admitting: Orthopedic Surgery

## 2011-09-16 DIAGNOSIS — M25511 Pain in right shoulder: Secondary | ICD-10-CM

## 2011-09-23 ENCOUNTER — Ambulatory Visit (HOSPITAL_COMMUNITY)
Admission: RE | Admit: 2011-09-23 | Discharge: 2011-09-23 | Disposition: A | Discharge status: Home / Self Care | Payer: Managed Care, Other (non HMO) | Source: Ambulatory Visit | Attending: Orthopedic Surgery | Admitting: Orthopedic Surgery

## 2011-09-23 DIAGNOSIS — M25519 Pain in unspecified shoulder: Secondary | ICD-10-CM | POA: Insufficient documentation

## 2011-09-23 DIAGNOSIS — M25511 Pain in right shoulder: Secondary | ICD-10-CM

## 2011-09-27 ENCOUNTER — Encounter: Payer: Self-pay | Admitting: Internal Medicine

## 2011-09-27 ENCOUNTER — Ambulatory Visit (INDEPENDENT_AMBULATORY_CARE_PROVIDER_SITE_OTHER): Payer: Managed Care, Other (non HMO) | Admitting: Internal Medicine

## 2011-09-27 VITALS — BP 130/80 | HR 72 | Temp 98.2°F | Resp 16 | Ht 62.5 in | Wt 186.0 lb

## 2011-09-27 DIAGNOSIS — Z9884 Bariatric surgery status: Secondary | ICD-10-CM

## 2011-09-27 DIAGNOSIS — F32A Depression, unspecified: Secondary | ICD-10-CM

## 2011-09-27 DIAGNOSIS — R635 Abnormal weight gain: Secondary | ICD-10-CM

## 2011-09-27 DIAGNOSIS — F329 Major depressive disorder, single episode, unspecified: Secondary | ICD-10-CM

## 2011-09-27 DIAGNOSIS — I10 Essential (primary) hypertension: Secondary | ICD-10-CM

## 2011-09-27 NOTE — Progress Notes (Signed)
Subjective:    Patient ID: Alexandra [name], female    DOB: 1961/01/29, 51 y.o.   MRN: [id number]  HPI Rigtht shoulder still hurts and this has resulted in depression and increased weight gain. Also she is under significant family stress. Mood is down and rates herself as depressed. She is status post bariatric surgery with significant weight gain and now has regained approximately 10-15% of the weight. She believes that this weight gain is primarily due to depression and she recognizes that she is not eating appropriately this is complicated by a history of hypothyroidism hyperlipidemia and prediabetes.   Review of Systems  Constitutional: Positive for unexpected weight change. Negative for activity change, appetite change and fatigue.  HENT: Negative for ear pain, congestion, neck pain, postnasal drip and sinus pressure.   Eyes: Negative for redness and visual disturbance.  Respiratory: Negative for cough, shortness of breath and wheezing.   Gastrointestinal: Negative for abdominal pain and abdominal distention.  Genitourinary: Negative for dysuria, frequency and menstrual problem.  Musculoskeletal: Negative for myalgias, joint swelling and arthralgias.  Skin: Negative for rash and wound.  Neurological: Negative for dizziness, weakness and headaches.  Hematological: Negative for adenopathy. Does not bruise/bleed easily.  Psychiatric/Behavioral: Positive for sleep disturbance and dysphoric mood. Negative for decreased concentration.   Past Medical History  Diagnosis Date  . GERD (gastroesophageal reflux disease)     prn med.  . Hypothyroidism   . Hypertension     new dx. - will start med. 06/28/2011  . Sleep apnea     prior to gastric bypass - no sleep study since bypass  . Rotator cuff tear, right     History   Social History  . Marital Status: Married    Spouse Name: N/A    Number of Children: N/A  . Years of Education: N/A   Occupational History  . Not on file.    Social History Main Topics  . Smoking status: Never Smoker   . Smokeless tobacco: Never Used  . Alcohol Use: No  . Drug Use: No  . Sexually Active: Not on file   Other Topics Concern  . Not on file   Social History Narrative  . No narrative on file    Past Surgical History  Procedure Date  . Cesarean section 1991  . Cholecystectomy 1989  . Shoulder arthroscopy distal clavicle excision and open rotator cuff repair 07/26/2010    right  . Shoulder arthroscopy 01/02/2006; 05/31/2004    left 2007; right 2005  . Exploratory laparotomy 05/08/2000    right salpingo-oophorectomy  . Abdominal hysterectomy 06/25/2000    left salpingo-oophorectomy  . 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 Laminotomy / excision disk posterior cervical spine 08/10/2009    C7-T1  . Thyroid surgery early 2000s    goiter  . Cardiac catheterization 04/01/2006  . Shoulder arthroscopy 07/02/2011    Procedure: ARTHROSCOPY SHOULDER;  Surgeon: Wyn Forster., MD;  Location: [facility];  Service: Orthopedics;  Laterality: Right;  repair supraspinatus, Debride glenohumeral joint, labrum    No family history on file.  Allergies  Allergen Reactions  . Shrimp Flavor Swelling    Swelling of tongue and rash  . Ace Inhibitors Cough  . Oxycodone-Acetaminophen Rash    No current outpatient prescriptions on file prior to visit.    BP 130/80  Pulse 72  Temp 98.2 F (36.8 C)  Resp 16  Ht 5' 2.5" (1.588 m)  Wt 186 lb (84.369 kg)  BMI  33.48 kg/m2        Objective:   Physical Exam  Nursing note and vitals reviewed. Constitutional: She is oriented to person, place, and time. She appears well-developed and well-nourished. No distress.  HENT:  Head: Normocephalic and atraumatic.  Right Ear: External ear normal.  Left Ear: External ear normal.  Nose: Nose normal.  Mouth/Throat: Oropharynx is clear and moist.  Eyes: Conjunctivae and EOM are normal. Pupils are equal, round, and reactive to  light.  Neck: Normal range of motion. Neck supple. No JVD present. No tracheal deviation present. No thyromegaly present.  Cardiovascular: Normal rate, regular rhythm, normal heart sounds and intact distal pulses.   No murmur heard. Pulmonary/Chest: Effort normal and breath sounds normal. She has no wheezes. She exhibits no tenderness.  Abdominal: Soft. Bowel sounds are normal.  Musculoskeletal: Normal range of motion. She exhibits no edema and no tenderness.  Lymphadenopathy:    She has no cervical adenopathy.  Neurological: She is alert and oriented to person, place, and time. She has normal reflexes. No cranial nerve deficit.  Skin: Skin is warm and dry. She is not diaphoretic.  Psychiatric: She has a normal mood and affect. Her behavior is normal.          Assessment & Plan:  Believe that her other issue today is stress and control of stress and we have urged her to let us refer her to a counselor to discuss his problems with weight gain becomes problematic at the feet the bariatric surgery that she has undergone with such excellent results.  Blood pressure is currently stable nausea but we were hoping to titrate off the medication with the weight loss.  Hypothyroidism is stable but we will monitor her TSH to see if this is contributing to her depressed mood GERD is increased due to the weight gain. Again the primary diagnosis is her situational depression

## 2011-10-17 ENCOUNTER — Telehealth: Payer: Self-pay | Admitting: *Deleted

## 2011-10-17 NOTE — Telephone Encounter (Signed)
Pt. Needs Synthroid and generic Ziac (90) day supplies on both to [employer], please.

## 2011-10-18 ENCOUNTER — Other Ambulatory Visit: Payer: Self-pay | Admitting: *Deleted

## 2011-10-18 DIAGNOSIS — E039 Hypothyroidism, unspecified: Secondary | ICD-10-CM

## 2011-10-18 DIAGNOSIS — I10 Essential (primary) hypertension: Secondary | ICD-10-CM

## 2011-10-18 MED ORDER — LEVOTHYROXINE SODIUM 150 MCG PO TABS: 150.0000 ug | ORAL_TABLET | Freq: Every day | ORAL | Status: DC

## 2011-10-18 MED ORDER — BISOPROLOL-HYDROCHLOROTHIAZIDE 2.5-6.25 MG PO TABS: 1.0000 | ORAL_TABLET | Freq: Every day | ORAL | Status: DC

## 2011-10-18 NOTE — Telephone Encounter (Signed)
Printed and faxed to [place]- wouild continue to print when I tried to send it escribe, so I faxed to it [place]

## 2011-11-26 ENCOUNTER — Other Ambulatory Visit: Payer: Self-pay | Admitting: *Deleted

## 2011-11-26 DIAGNOSIS — I10 Essential (primary) hypertension: Secondary | ICD-10-CM

## 2011-11-26 MED ORDER — BISOPROLOL-HYDROCHLOROTHIAZIDE 2.5-6.25 MG PO TABS: 1.0000 | ORAL_TABLET | Freq: Every day | ORAL | Status: DC

## 2011-12-18 ENCOUNTER — Ambulatory Visit (INDEPENDENT_AMBULATORY_CARE_PROVIDER_SITE_OTHER): Payer: BC Managed Care – PPO | Admitting: Family Medicine

## 2011-12-18 ENCOUNTER — Encounter: Payer: Self-pay | Admitting: Family Medicine

## 2011-12-18 VITALS — BP 120/80 | Temp 98.4°F | Wt 188.0 lb

## 2011-12-18 DIAGNOSIS — M2550 Pain in unspecified joint: Secondary | ICD-10-CM

## 2011-12-18 DIAGNOSIS — M791 Myalgia, unspecified site: Secondary | ICD-10-CM

## 2011-12-18 DIAGNOSIS — IMO0001 Reserved for inherently not codable concepts without codable children: Secondary | ICD-10-CM

## 2011-12-18 LAB — CBC WITH DIFFERENTIAL/PLATELET
Basophils Relative: 0.4 % (ref 0.0–3.0)
Eosinophils Relative: 1.6 % (ref 0.0–5.0)
Lymphocytes Relative: 36.1 % (ref 12.0–46.0)
MCV: 92.7 fl (ref 78.0–100.0)
Neutrophils Relative %: 53.7 % (ref 43.0–77.0)
RBC: 4.21 Mil/uL (ref 3.87–5.11)
WBC: 7.8 10*3/uL (ref 4.5–10.5)

## 2011-12-18 NOTE — Progress Notes (Signed)
Subjective:    Patient ID: Alexandra [name], female    DOB: 07-11-1961, 51 y.o.   MRN: [id number]  HPI  Acute visit. Onset 5 days ago of diffuse body aches and myalgias. She has history of recent right shoulder surgery is recovering from that but has developed diffuse aches arms legs trunk and back. 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 She has not noted any warmth or erythema or any visible joint swelling. She has some pain involving PIP joints mostly left hand. No recent tick bite. No skin rash. No fever or chills. Denies any cough or upper respiratory symptoms. Family history significant for mother with rheumatoid arthritis. Patient does not take any nonsteroidals because of prior history of gastric bypass. Takes gabapentin and Cymbalta regularly. She recently started glucosamine about 3 weeks ago.  Past Medical History  Diagnosis Date  . GERD (gastroesophageal reflux disease)     prn med.  . Hypothyroidism   . Hypertension     new dx. - will start med. 06/28/2011  . Sleep apnea     prior to gastric bypass - no sleep study since bypass  . Rotator cuff tear, right    Past Surgical History  Procedure Date  . Cesarean section 1991  . Cholecystectomy 1989  . Shoulder arthroscopy distal clavicle excision and open rotator cuff repair 07/26/2010    right  . Shoulder arthroscopy 01/02/2006; 05/31/2004    left 2007; right 2005  . Exploratory laparotomy 05/08/2000    right salpingo-oophorectomy  . Abdominal hysterectomy 06/25/2000    left salpingo-oophorectomy  . 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 Laminotomy / excision disk posterior cervical spine 08/10/2009    C7-T1  . Thyroid surgery early 2000s    goiter  . Cardiac catheterization 04/01/2006  . Shoulder arthroscopy 07/02/2011    Procedure: ARTHROSCOPY SHOULDER;  Surgeon: Wyn Forster., MD;  Location: [facility];  Service: Orthopedics;  Laterality: Right;  repair supraspinatus, Debride glenohumeral  joint, labrum    reports that she has never smoked. She has never used smokeless tobacco. She reports that she does not drink alcohol or use illicit drugs. family history is not on file. Allergies  Allergen Reactions  . Shrimp Flavor Swelling    Swelling of tongue and rash  . Ace Inhibitors Cough  . Oxycodone-Acetaminophen Rash      Review of Systems  Constitutional: Positive for fatigue. Negative for appetite change and unexpected weight change.  Respiratory: Negative for shortness of breath.   Cardiovascular: Negative for chest pain.  Gastrointestinal: Negative for abdominal pain.  Musculoskeletal: Positive for myalgias and arthralgias. Negative for joint swelling and gait problem.  Skin: Negative for rash.  Neurological: Negative for headaches.  Hematological: Negative for adenopathy. Does not bruise/bleed easily.       Objective:   Physical Exam  Constitutional: She is oriented to person, place, and time. She appears well-developed and well-nourished.  HENT:  Mouth/Throat: Oropharynx is clear and moist.  Neck: Neck supple. No thyromegaly present.  Cardiovascular: Normal rate and regular rhythm.   Pulmonary/Chest: Effort normal and breath sounds normal. No respiratory distress. She has no wheezes. She has no rales.  Musculoskeletal: She exhibits no edema.       No visible joint swelling or erythema and no warmth. Mild crepitus with flexion and extension of both knees.  Lymphadenopathy:    She has no cervical adenopathy.  Neurological: She is alert and oriented to person, place, and time.  Assessment & Plan:  Patient presents with rather acute onset of increased arthralgias and myalgias without evidence for acute joint inflammation.  Start with CBC, sedimentation rate, rheumatoid factor, and antinuclear antibodies.

## 2011-12-19 LAB — ANA: Anti Nuclear Antibody(ANA): NEGATIVE

## 2011-12-19 LAB — RHEUMATOID FACTOR: Rhuematoid fact SerPl-aCnc: <10 IU/mL (ref <=14)

## 2011-12-20 NOTE — Progress Notes (Signed)
Quick Note:  Pt informed ______ 

## 2012-01-12 ENCOUNTER — Emergency Department (HOSPITAL_COMMUNITY)
Admission: EM | Admit: 2012-01-12 | Discharge: 2012-01-12 | Disposition: A | Discharge status: Home / Self Care | Payer: BC Managed Care – PPO | Source: Home / Self Care | Attending: Family Medicine | Admitting: Family Medicine

## 2012-01-12 ENCOUNTER — Encounter (HOSPITAL_COMMUNITY): Payer: Self-pay | Admitting: *Deleted

## 2012-01-12 DIAGNOSIS — R197 Diarrhea, unspecified: Secondary | ICD-10-CM

## 2012-01-12 LAB — POCT I-STAT, CHEM 8
Chloride: 103 mEq/L (ref 96–112)
HCT: 46 % (ref 36.0–46.0)
Potassium: 3.4 mEq/L — ABNORMAL LOW (ref 3.5–5.1)
Sodium: 146 mEq/L — ABNORMAL HIGH (ref 135–145)

## 2012-01-12 MED ORDER — DIPHENOXYLATE-ATROPINE 2.5-0.025 MG PO TABS: 1.0000 | ORAL_TABLET | Freq: Four times a day (QID) | ORAL | Status: AC | PRN

## 2012-01-12 NOTE — Discharge Instructions (Signed)
Diarrhea Infections caused by germs (bacterial) or a virus commonly cause diarrhea. Your caregiver has determined that with time, rest and fluids, the diarrhea should improve. In general, eat normally while drinking more water than usual. Although water may prevent dehydration, it does not contain salt and minerals (electrolytes). Broths, weak tea without caffeine and oral rehydration solutions (ORS) replace fluids and electrolytes. Small amounts of fluids should be taken frequently. Large amounts at one time may not be tolerated. Plain water may be harmful in infants and the elderly. Oral rehydrating solutions (ORS) are available at pharmacies and grocery stores. ORS replace water and important electrolytes in proper proportions. Sports drinks are not as effective as ORS and may be harmful due to sugars worsening diarrhea.  ORS is especially recommended for use in children with diarrhea. As a general guideline for children, replace any new fluid losses from diarrhea and/or vomiting with ORS as follows:   If your child weighs 22 pounds or under (10 kg or less), give 60-120 mL ( -  cup or 2 - 4 ounces) of ORS for each episode of diarrheal stool or vomiting episode.   If your child weighs more than 22 pounds (more than 10 kgs), give 120-240 mL ( - 1 cup or 4 - 8 ounces) of ORS for each diarrheal stool or episode of vomiting.   While correcting for dehydration, children should eat normally. However, foods high in sugar should be avoided because this may worsen diarrhea. Large amounts of carbonated soft drinks, juice, gelatin desserts and other highly sugared drinks should be avoided.   After correction of dehydration, other liquids that are appealing to the child may be added. Children should drink small amounts of fluids frequently and fluids should be increased as tolerated. Children should drink enough fluids to keep urine clear or pale yellow.   Adults should eat normally while drinking more fluids  than usual. Drink small amounts of fluids frequently and increase as tolerated. Drink enough fluids to keep urine clear or pale yellow. Broths, weak decaffeinated tea, lemon lime soft drinks (allowed to go flat) and ORS replace fluids and electrolytes.   Avoid:   Carbonated drinks.   Juice.   Extremely hot or cold fluids.   Caffeine drinks.   Fatty, greasy foods.   Alcohol.   Tobacco.   Too much intake of anything at one time.   Gelatin desserts.   Probiotics are active cultures of beneficial bacteria. They may lessen the amount and number of diarrheal stools in adults. Probiotics can be found in yogurt with active cultures and in supplements.   Wash hands well to avoid spreading bacteria and virus.   Anti-diarrheal medications are not recommended for infants and children.   Only take over-the-counter or prescription medicines for pain, discomfort or fever as directed by your caregiver. Do not give aspirin to children because it may cause Reye's Syndrome.   For adults, ask your caregiver if you should continue all prescribed and over-the-counter medicines.   If your caregiver has given you a follow-up appointment, it is very important to keep that appointment. Not keeping the appointment could result in a chronic or permanent injury, and disability. If there is any problem keeping the appointment, you must call back to this facility for assistance.  SEEK IMMEDIATE MEDICAL CARE IF:   You or your child is unable to keep fluids down or other symptoms or problems become worse in spite of treatment.   Vomiting or diarrhea develops and becomes persistent.     There is vomiting of blood or bile (green material).   There is blood in the stool or the stools are black and tarry.   There is no urine output in 6-8 hours or there is only a small amount of very dark urine.   Abdominal pain develops, increases or localizes.   You have a fever.   Your baby is older than 3 months with a  rectal temperature of 102 F (38.9 C) or higher.   Your baby is 3 months old or younger with a rectal temperature of 100.4 F (38 C) or higher.   You or your child develops excessive weakness, dizziness, fainting or extreme thirst.   You or your child develops a rash, stiff neck, severe headache or become irritable or sleepy and difficult to awaken.  MAKE SURE YOU:   Understand these instructions.   Will watch your condition.   Will get help right away if you are not doing well or get worse.  Document Released: 07/05/2002 Document Revised: 07/04/2011 Document Reviewed: 05/22/2009 [employer] Patient Information 2012 [employer]. 

## 2012-01-12 NOTE — ED Provider Notes (Signed)
History     CSN: [id number]  Arrival date & time 01/12/12  1651   First MD Initiated Contact with Patient 01/12/12 1725      Chief Complaint  Patient presents with  . Diarrhea  . Nausea  . Abdominal Pain    (Consider location/radiation/quality/duration/timing/severity/associated sxs/prior treatment) Patient is a 51 y.o. female presenting with diarrhea. The history is provided by the patient. No language interpreter was used.  Diarrhea The primary symptoms include diarrhea. The illness began yesterday. The onset was gradual. The problem has been gradually worsening.  The illness is also significant for chills and back pain. The illness does not include anorexia or constipation. Significant associated medical issues include gastric bypass.   Pt complains of multiple episodes of diarrhea.   Past Medical History  Diagnosis Date  . GERD (gastroesophageal reflux disease)     prn med.  . Hypothyroidism   . Hypertension     new dx. - will start med. 06/28/2011  . Sleep apnea     prior to gastric bypass - no sleep study since bypass  . Rotator cuff tear, right     Past Surgical History  Procedure Date  . Cesarean section 1991  . Cholecystectomy 1989  . Shoulder arthroscopy distal clavicle excision and open rotator cuff repair 07/26/2010    right  . Shoulder arthroscopy 01/02/2006; 05/31/2004    left 2007; right 2005  . Exploratory laparotomy 05/08/2000    right salpingo-oophorectomy  . Abdominal hysterectomy 06/25/2000    left salpingo-oophorectomy  . 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 Laminotomy / excision disk posterior cervical spine 08/10/2009    C7-T1  . Thyroid surgery early 2000s    goiter  . Cardiac catheterization 04/01/2006  . Shoulder arthroscopy 07/02/2011    Procedure: ARTHROSCOPY SHOULDER;  Surgeon: Wyn Forster., MD;  Location: [facility];  Service: Orthopedics;  Laterality: Right;  repair supraspinatus, Debride glenohumeral joint, labrum      History reviewed. No pertinent family history.  History  Substance Use Topics  . Smoking status: Never Smoker   . Smokeless tobacco: Never Used  . Alcohol Use: No    OB History    Grav Para Term Preterm Abortions TAB SAB Ect Mult Living                  Review of Systems  Constitutional: Positive for chills.  Gastrointestinal: Positive for diarrhea. Negative for constipation and anorexia.  Musculoskeletal: Positive for back pain.  All other systems reviewed and are negative.    Allergies  Shrimp flavor; Ace inhibitors; and Oxycodone-acetaminophen  Home Medications   Current Outpatient Rx  Name Route Sig Dispense Refill  . BISOPROLOL-HYDROCHLOROTHIAZIDE 2.5-6.25 MG PO TABS Oral Take 1 tablet by mouth daily. 90 tablet 3  . DULOXETINE HCL 20 MG PO CPEP Oral Take 20 mg by mouth daily. Samples per Dr Lovell Sheehan    . GABAPENTIN 300 MG PO CAPS Oral Take 300 mg by mouth 2 (two) times daily. Per Dr Teressa Senter    . LEVOTHYROXINE SODIUM 150 MCG PO TABS Oral Take 1 tablet (150 mcg total) by mouth daily. 90 tablet 3  . IMODIUM A-D PO Oral Take by mouth.    Marland Kitchen GLUCOSAMINE 1500 COMPLEX PO Oral Take by mouth daily.      BP 146/89  Pulse 93  Temp 98.8 F (37.1 C) (Oral)  Resp 18  SpO2 97%  Physical Exam  Vitals reviewed. Constitutional: She is oriented to person, place, and  time. She appears well-developed and well-nourished.  HENT:  Head: Normocephalic and atraumatic.  Right Ear: External ear normal.  Left Ear: External ear normal.  Nose: Nose normal.  Mouth/Throat: Oropharynx is clear and moist.  Eyes: Conjunctivae and EOM are normal. Pupils are equal, round, and reactive to light.  Neck: Normal range of motion. Neck supple.  Cardiovascular: Normal rate and normal heart sounds.   Pulmonary/Chest: Effort normal and breath sounds normal.  Abdominal: Soft. There is no tenderness.  Musculoskeletal: Normal range of motion.  Neurological: She is alert and oriented to person,  place, and time. She has normal reflexes.  Skin: Skin is warm.  Psychiatric: She has a normal mood and affect.    ED Course  Procedures (including critical care time)  Labs Reviewed  POCT I-STAT, CHEM 8 - Abnormal; Notable for the following:    Sodium 146 (*)     Potassium 3.4 (*)     BUN <3 (*)     Glucose, Bld 123 (*)     Hemoglobin 15.6 (*)     All other components within normal limits   No results found.   No diagnosis found.    MDM  Pt given lomotil,   I advised increase fluids,   See your Physicain for recheck tomorrow       Elson Areas, [place] 01/12/12 1858

## 2012-01-12 NOTE — ED Notes (Signed)
Pt with onset of left lower to mid abdominal cramping /nausea/diarrhea Friday 11 pm yesterday too many episodes to count today x 4 - pt not eating - tried apple juice and water continues with diarrhea - now with weakness

## 2012-01-13 LAB — POCT URINALYSIS DIP (DEVICE)
Glucose, UA: NEGATIVE mg/dL
Ketones, ur: 40 mg/dL — AB
Specific Gravity, Urine: 1.02 (ref 1.005–1.030)

## 2012-01-13 NOTE — ED Provider Notes (Signed)
Medical screening examination/treatment/procedure(s) were performed by non-physician practitioner and as supervising physician I was immediately available for consultation/collaboration.   MORENO-COLL,Husna Krone; MD   Lebert Lovern Moreno-Coll, MD 01/13/12 0954 

## 2012-02-17 ENCOUNTER — Telehealth: Payer: Self-pay | Admitting: Internal Medicine

## 2012-02-17 ENCOUNTER — Other Ambulatory Visit: Payer: Self-pay | Admitting: *Deleted

## 2012-02-17 DIAGNOSIS — E039 Hypothyroidism, unspecified: Secondary | ICD-10-CM

## 2012-02-17 MED ORDER — LEVOTHYROXINE SODIUM 150 MCG PO TABS: 150.0000 ug | ORAL_TABLET | Freq: Every day | ORAL | Status: DC

## 2012-02-17 NOTE — Telephone Encounter (Signed)
done

## 2012-02-17 NOTE — Telephone Encounter (Signed)
Pts mail order pharmacy has changed to [facility]. Pls send 90 day supply of levothyroxine (SYNTHROID, LEVOTHROID) 150 MCG tablet to [facility]. Also send a 30 day supply to [facility] on [address]. To last until mail order arrives.

## 2012-03-08 ENCOUNTER — Encounter (HOSPITAL_COMMUNITY): Payer: Self-pay | Admitting: *Deleted

## 2012-03-08 ENCOUNTER — Emergency Department (HOSPITAL_COMMUNITY)
Admission: EM | Admit: 2012-03-08 | Discharge: 2012-03-08 | Disposition: A | Discharge status: Home / Self Care | Payer: BC Managed Care – PPO | Source: Home / Self Care | Attending: Emergency Medicine | Admitting: Emergency Medicine

## 2012-03-08 DIAGNOSIS — R109 Unspecified abdominal pain: Secondary | ICD-10-CM

## 2012-03-08 LAB — POCT URINALYSIS DIP (DEVICE)
Bilirubin Urine: NEGATIVE
Glucose, UA: NEGATIVE mg/dL
Hgb urine dipstick: NEGATIVE
Ketones, ur: NEGATIVE mg/dL
Nitrite: NEGATIVE
Specific Gravity, Urine: 1.02 (ref 1.005–1.030)
pH: 8.5 — ABNORMAL HIGH (ref 5.0–8.0)

## 2012-03-08 MED ORDER — DICLOFENAC SODIUM 1 % TD GEL: 1.0000 "application " | Freq: Four times a day (QID) | TRANSDERMAL | Status: DC

## 2012-03-08 MED ORDER — HYDROCODONE-ACETAMINOPHEN 5-325 MG PO TABS: 2.0000 | ORAL_TABLET | ORAL | Status: DC | PRN

## 2012-03-08 MED ORDER — DIAZEPAM 5 MG PO TABS: 5.0000 mg | ORAL_TABLET | Freq: Three times a day (TID) | ORAL | Status: AC | PRN

## 2012-03-08 NOTE — ED Provider Notes (Signed)
History     CSN: [id number]  Arrival date & time 03/08/12  1111   First MD Initiated Contact with Patient 03/08/12 1123      Chief Complaint  Patient presents with  . Hip Pain  . Back Pain  . Leg Pain    (Consider location/radiation/quality/duration/timing/severity/associated sxs/prior treatment) HPI Comments: Patient reports intermittent, sharp, right lower flank pain with occasional radiation to her right thigh starting 5 days ago. Initial symptoms lasted for a day, completely resolved. Symptoms returned 3 days ago. She has tried some leftover Flexeril, Norco, ibuprofen 600 mg without much improvement. No recent or remote history of trauma. She doesn't recall any change in her physical activity. Pain is worse with lateral bending, torso rotation, lying down. No anorexia, nausea, vomiting, hematuria, urinary urgency, frequency, dysuria, cloudy or oderous urine. No vaginal bleeding or discharge. No abdominal pain, abdominal distention. Last bowel movement was this morning, and was normal amount and color.  No rash.She has no history of kidney stones. Patient has had an extensive medical history, significant for lumbar discectomy and lumbar fusion years ago, cholecystectomy, and gastric bypass.  ROS as noted in HPI. All other ROS negative.   Patient is a 51 y.o. female presenting with back pain. The history is provided by the patient. No language interpreter was used.  Back Pain  This is a new problem. The current episode started more than 2 days ago. The problem occurs daily. The problem has not changed since onset.The pain is associated with no known injury. The quality of the pain is described as stabbing. The pain radiates to the right thigh. The symptoms are aggravated by bending, twisting and certain positions. The pain is worse during the day. Associated symptoms include leg pain. Pertinent negatives include no chest pain, no fever, no numbness, no abdominal pain, no bladder  incontinence, no dysuria, no pelvic pain, no paresthesias, no paresis, no tingling and no weakness. She has tried NSAIDs and muscle relaxants for the symptoms. The treatment provided no relief. Risk factors include obesity.    Past Medical History  Diagnosis Date  . GERD (gastroesophageal reflux disease)     prn med.  . Hypothyroidism   . Hypertension     new dx. - will start med. 06/28/2011  . Sleep apnea     prior to gastric bypass - no sleep study since bypass  . Rotator cuff tear, right     Past Surgical History  Procedure Date  . Cesarean section 1991  . Cholecystectomy 1989  . Shoulder arthroscopy distal clavicle excision and open rotator cuff repair 07/26/2010    right  . Shoulder arthroscopy 01/02/2006; 05/31/2004    left 2007; right 2005  . Exploratory laparotomy 05/08/2000    right salpingo-oophorectomy  . Abdominal hysterectomy 06/25/2000    left salpingo-oophorectomy  . 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 Laminotomy / excision disk posterior cervical spine 08/10/2009    C7-T1  . Thyroid surgery early 2000s    goiter  . Cardiac catheterization 04/01/2006  . Shoulder arthroscopy 07/02/2011    Procedure: ARTHROSCOPY SHOULDER;  Surgeon: Wyn Forster., MD;  Location: [facility];  Service: Orthopedics;  Laterality: Right;  repair supraspinatus, Debride glenohumeral joint, labrum  . Back surgery 1997    discetomy, lumbar spinal fusion    History reviewed. No pertinent family history.  History  Substance Use Topics  . Smoking status: Never Smoker   . Smokeless tobacco: Never Used  . Alcohol Use: No  OB History    Grav Para Term Preterm Abortions TAB SAB Ect Mult Living                  Review of Systems  Constitutional: Negative for fever.  Cardiovascular: Negative for chest pain.  Gastrointestinal: Negative for abdominal pain.  Genitourinary: Negative for bladder incontinence, dysuria and pelvic pain.  Musculoskeletal: Positive for  back pain.  Neurological: Negative for tingling, weakness, numbness and paresthesias.    Allergies  Shrimp flavor; Ace inhibitors; and Oxycodone-acetaminophen  Home Medications   Current Outpatient Rx  Name Route Sig Dispense Refill  . BISOPROLOL-HYDROCHLOROTHIAZIDE 2.5-6.25 MG PO TABS Oral Take 1 tablet by mouth daily. 90 tablet 3  . LEVOTHYROXINE SODIUM 150 MCG PO TABS Oral Take 1 tablet (150 mcg total) by mouth daily. 90 tablet 3  . DIAZEPAM 5 MG PO TABS Oral Take 1 tablet (5 mg total) by mouth every 8 (eight) hours as needed for anxiety. 16 tablet 0  . DICLOFENAC SODIUM 1 % TD GEL Topical Apply 1 application topically 4 (four) times daily. 100 g 0  . HYDROCODONE-ACETAMINOPHEN 5-325 MG PO TABS Oral Take 2 tablets by mouth every 4 (four) hours as needed for pain. 20 tablet 0    BP 134/87  Pulse 71  Temp 98.8 F (37.1 C) (Oral)  Resp 16  SpO2 96%  Physical Exam  Nursing note and vitals reviewed. Constitutional: She is oriented to person, place, and time. She appears well-developed and well-nourished. No distress.  HENT:  Head: Normocephalic and atraumatic.  Eyes: Conjunctivae and EOM are normal.  Neck: Normal range of motion.  Cardiovascular: Regular rhythm.   Pulmonary/Chest: Effort normal and breath sounds normal.  Abdominal: Soft. Bowel sounds are normal. She exhibits no distension and no mass. There is tenderness. There is no rebound, no guarding, no CVA tenderness, no tenderness at McBurney's point and negative Murphy's sign.         Multiple healed surgical scars. Tenderness in right flank, see drawing  Musculoskeletal: Normal range of motion.       Lumbar back: She exhibits tenderness and pain. She exhibits normal range of motion.       Back:       Healed surgical scar over L spine. No pain with lateral bending, torso rotation,. No muscular tenderness over gluteal muscles, down IT band, quadriceps. No pain with passive abduction/adduction of leg. No pain/pain with  int/ext rotation hip. No tenderness at sciatic notch. Roll test for muscle spasm negative. Flexion/extension knee WNL. Knee joint NT, stable. Motor strength flexion/ext hip 5/5. Sensation to LT intact. DP 2+  Neurological: She is alert and oriented to person, place, and time. Coordination normal.  Skin: Skin is warm and dry.  Psychiatric: She has a normal mood and affect. Her behavior is normal. Judgment and thought content normal.    ED Course  Procedures (including critical care time)  Labs Reviewed  POCT URINALYSIS DIP (DEVICE) - Abnormal; Notable for the following:    pH 8.5 (*)     All other components within normal limits   No results found.   1. Flank pain       MDM  Previous chart, labs reviewed. Seen for diffuse arthralgia/myalgia 5/22, arthritis w/u with ANA, RF neg.  urine negative for infection or hematuria. The patient is symptomatic while in department, doubt kidney stones with clear urine. H&P is most consistent with flank pain/muscle spasm. Pt abd exam is benign, no peritoneal signs. No evidence of  surgical abd. Doubt SBO, mesenteric ischemia, appendicitis, hepatitis, cholecystitis, pancreatitis, or perforated viscus. No evidence to support or suggest GYN pathology such as ovarian torsion or infection. No bony tenderness along L-spine, no neurologic deficits no history of recent trauma. No pain with hip range of motion. Deferring imaging. Will try Valium as muscle relaxants, topical NSAIDs, short course of Norco. given patient's history of gastric bypass. She'll follow with her primary care physician in 2 days if her symptoms have not improved. Discussed signs and symptoms I should prompt return to the department. She agrees with plan     Luiz Blare, MD 03/08/12 1247

## 2012-03-08 NOTE — ED Notes (Signed)
Pt with c/o pain right hip/back radiates to right inner thigh onset of symptoms Tuesday improved Thursday  - worse last 2 days - unable to sleep last night due to pain - increased pain with movement -sharp intermittent pain

## 2012-03-11 ENCOUNTER — Encounter: Payer: Self-pay | Admitting: Family

## 2012-03-11 ENCOUNTER — Ambulatory Visit (INDEPENDENT_AMBULATORY_CARE_PROVIDER_SITE_OTHER)
Admission: RE | Admit: 2012-03-11 | Discharge: 2012-03-11 | Disposition: A | Discharge status: Home / Self Care | Payer: BC Managed Care – PPO | Source: Ambulatory Visit | Attending: Family | Admitting: Family

## 2012-03-11 ENCOUNTER — Ambulatory Visit (INDEPENDENT_AMBULATORY_CARE_PROVIDER_SITE_OTHER): Payer: BC Managed Care – PPO | Admitting: Family

## 2012-03-11 VITALS — BP 160/90 | HR 91 | Temp 98.4°F | Wt 186.0 lb

## 2012-03-11 DIAGNOSIS — R1031 Right lower quadrant pain: Secondary | ICD-10-CM

## 2012-03-11 LAB — COMPREHENSIVE METABOLIC PANEL
AST: 25 U/L (ref 0–37)
Albumin: 3.7 g/dL (ref 3.5–5.2)
BUN: 8 mg/dL (ref 6–23)
Calcium: 9.1 mg/dL (ref 8.4–10.5)
Chloride: 101 mEq/L (ref 96–112)
Potassium: 3.6 mEq/L (ref 3.5–5.1)

## 2012-03-11 LAB — POCT URINALYSIS DIPSTICK
Blood, UA: NEGATIVE
Protein, UA: NEGATIVE
Spec Grav, UA: 1.02
Urobilinogen, UA: 1

## 2012-03-11 LAB — CBC WITH DIFFERENTIAL/PLATELET
Basophils Relative: 0.3 % (ref 0.0–3.0)
Eosinophils Absolute: 0.3 10*3/uL (ref 0.0–0.7)
Eosinophils Relative: 3.5 % (ref 0.0–5.0)
Lymphocytes Relative: 38.8 % (ref 12.0–46.0)
Neutrophils Relative %: 51.3 % (ref 43.0–77.0)
RBC: 4.29 Mil/uL (ref 3.87–5.11)
WBC: 7.4 10*3/uL (ref 4.5–10.5)

## 2012-03-11 LAB — HEMOGLOBIN A1C: Hgb A1c MFr Bld: 6.9 % — ABNORMAL HIGH (ref 4.6–6.5)

## 2012-03-11 MED ORDER — HYDROCODONE-ACETAMINOPHEN 5-500 MG PO TABS: 1.0000 | ORAL_TABLET | Freq: Three times a day (TID) | ORAL | Status: AC | PRN

## 2012-03-11 MED ORDER — DICYCLOMINE HCL 20 MG PO TABS: 20.0000 mg | ORAL_TABLET | Freq: Four times a day (QID) | ORAL | Status: DC

## 2012-03-11 NOTE — Progress Notes (Signed)
Subjective:    Patient ID: Alexandra [name], female    DOB: 24-Aug-1960, 51 y.o.   MRN: [id number]  HPI 51 year old AAF, nonsmoker, patient of Dr. Lovell Sheehan is in today with right lower quadrant pain x 10 days. She describes the pan as intermittent, sharp. Pain is worse with laying and coughing. Better with sitting. Pain is a 10/10 when it occurs sharply and a 3/10 at its least. She denies and diarrhea or constipation. Heating pad makes it better. She has had a hysterectomy. LBM-yesterday.    Patient was seen in the Urgent care clinic on Sunday and continuing to have pain.  Review of Systems  Constitutional: Negative.   HENT: Negative.   Respiratory: Negative.   Cardiovascular: Negative.   Gastrointestinal: Positive for abdominal pain. Negative for nausea, diarrhea and constipation.       Right lower quadrant  Genitourinary: Negative.   Musculoskeletal: Negative.   Skin: Negative.   Hematological: Negative.   Psychiatric/Behavioral: Negative.    Past Medical History  Diagnosis Date  . GERD (gastroesophageal reflux disease)     prn med.  . Hypothyroidism   . Hypertension     new dx. - will start med. 06/28/2011  . Sleep apnea     prior to gastric bypass - no sleep study since bypass  . Rotator cuff tear, right     History   Social History  . Marital Status: Married    Spouse Name: N/A    Number of Children: N/A  . Years of Education: N/A   Occupational History  . Not on file.   Social History Main Topics  . Smoking status: Never Smoker   . Smokeless tobacco: Never Used  . Alcohol Use: No  . Drug Use: No  . Sexually Active: Not on file   Other Topics Concern  . Not on file   Social History Narrative  . No narrative on file    Past Surgical History  Procedure Date  . Cesarean section 1991  . Cholecystectomy 1989  . Shoulder arthroscopy distal clavicle excision and open rotator cuff repair 07/26/2010    right  . Shoulder arthroscopy 01/02/2006; 05/31/2004   left 2007; right 2005  . Exploratory laparotomy 05/08/2000    right salpingo-oophorectomy  . Abdominal hysterectomy 06/25/2000    left salpingo-oophorectomy  . 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 Laminotomy / excision disk posterior cervical spine 08/10/2009    C7-T1  . Thyroid surgery early 2000s    goiter  . Cardiac catheterization 04/01/2006  . Shoulder arthroscopy 07/02/2011    Procedure: ARTHROSCOPY SHOULDER;  Surgeon: Wyn Forster., MD;  Location: [facility];  Service: Orthopedics;  Laterality: Right;  repair supraspinatus, Debride glenohumeral joint, labrum  . Back surgery 1997    discetomy, lumbar spinal fusion    No family history on file.  Allergies  Allergen Reactions  . Shrimp Flavor Swelling    Swelling of tongue and rash  . Ace Inhibitors Cough  . Oxycodone-Acetaminophen Rash    Current Outpatient Prescriptions on File Prior to Visit  Medication Sig Dispense Refill  . bisoprolol-hydrochlorothiazide (ZIAC) 2.5-6.25 MG per tablet Take 1 tablet by mouth daily.  90 tablet  3  . diazepam (VALIUM) 5 MG tablet Take 1 tablet (5 mg total) by mouth every 8 (eight) hours as needed for anxiety.  16 tablet  0  . diclofenac sodium (VOLTAREN) 1 % GEL Apply 1 application topically 4 (four) times daily.  100 g  0  . dicyclomine (BENTYL) 20 MG tablet Take 1 tablet (20 mg total) by mouth every 6 (six) hours.  30 tablet  1  . levothyroxine (SYNTHROID, LEVOTHROID) 150 MCG tablet Take 1 tablet (150 mcg total) by mouth daily.  90 tablet  3  . DISCONTD: DULoxetine (CYMBALTA) 20 MG capsule Take 20 mg by mouth daily. Samples per Dr Lovell Sheehan      . DISCONTD: gabapentin (NEURONTIN) 300 MG capsule Take 300 mg by mouth 2 (two) times daily. Per Dr Teressa Senter        BP 160/90  Pulse 91  Temp 98.4 F (36.9 C) (Oral)  Wt 186 lb (84.369 kg)  SpO2 93%chart    Objective:   Physical Exam  Constitutional: She is oriented to person, place, and time. She appears well-developed and  well-nourished.  Neck: Normal range of motion. Neck supple.  Cardiovascular: Normal rate, regular rhythm and normal heart sounds.   Pulmonary/Chest: Effort normal and breath sounds normal.  Abdominal: Soft. Bowel sounds are normal. There is tenderness. There is no rebound and no guarding.       Right lower quadrant  Neurological: She is alert and oriented to person, place, and time.  Skin: Skin is warm and dry.  Psychiatric: She has a normal mood and affect.          Assessment & Plan:  Assessment: Right Lower Quadrant Pain  Plan: Labs sent. KUB ordered. Bentyl 20mg  TID as needed for colonic spasm. Patient to call the office if symptoms worsen or persist. Will consider ultrasound of the abdomen and pelvis if no better.

## 2012-03-12 ENCOUNTER — Telehealth: Payer: Self-pay | Admitting: Family

## 2012-03-12 ENCOUNTER — Ambulatory Visit: Payer: BC Managed Care – PPO | Admitting: Family

## 2012-03-12 LAB — URINE CULTURE: Colony Count: 40000

## 2012-03-12 NOTE — Telephone Encounter (Signed)
Caller: Alexandra [name]/Patient; Patient Name: Alexandra [name]; PCP: Adline Mango; Best Callback Phone Number: [phone].  Patient calling today 03/12/12 regarding was in office yesterday and saw Adline Mango and had labs drawn, provider told her the results would be back today.  Patient calling for lab results.  Pulled chart in EPIC and see note from [facility] yesterday saying, "Blood sugar too high. Appears to have type 2 diabetes. I am adding on an A1C. Schedule an appointment to discuss medication options in the next 1-2 days."  Pt also asking about her x-ray results, note from [employer] reads, "Xray is normal. If no better after taking bentyl, we will do an ultrasound. Let us know by late tomorrow or Friday." Patient informed of all information per provider notes.  OFFICE PLEASE CALL PT BACK TO SCHEDULE APPOINTMENT FOR HER TO COME IN AND TALK TO DOCTOR ABOUT DIABETES MEDICATIONS.  ([facility] schedules sick visit appointments)

## 2012-03-13 ENCOUNTER — Telehealth: Payer: Self-pay | Admitting: Internal Medicine

## 2012-03-13 NOTE — Telephone Encounter (Signed)
Caller: [name]/Patient; Patient Name: Alexandra [name]; PCP: Darryll Capers; Best Callback Phone Number: [phone].  Patient was seen in the office yesterday. She is calling back in reqards to missed call from the office today.   Patient states she already has a follow up appointment with her physician for type II Diabetes , Tuesday 03/17/12 , Dr. Orvan Falconer.  Patient reports ongoing abdominal even with taking bentyl.Marland KitchenMarland Kitchen? schedule for an ultrasound. Epic notes reviewed. No new message noted in history. Please review and advise patient for any appointment or procedures due to missed phone call .

## 2012-03-13 NOTE — Telephone Encounter (Signed)
Completed by CAN.  Pt has appt with PC.

## 2012-03-16 ENCOUNTER — Other Ambulatory Visit: Payer: Self-pay | Admitting: Internal Medicine

## 2012-03-16 ENCOUNTER — Telehealth: Payer: Self-pay | Admitting: Family Medicine

## 2012-03-16 DIAGNOSIS — R109 Unspecified abdominal pain: Secondary | ICD-10-CM

## 2012-03-16 DIAGNOSIS — R197 Diarrhea, unspecified: Secondary | ICD-10-CM

## 2012-03-16 NOTE — Telephone Encounter (Signed)
Spoke to the pt.  She is still having RLQ pain.  Seen by Padonda on 03/11/12.  Had abdominal view and labs that were normal.  Does not have consistent pain.  Comes and goes.  On a scale from 1-10 she is at a 10 when she has it.  Other wise at 2-3.  Placed her on Dr. Mar Daring scheduled for 03/18/12.  No opening with Dr. Lovell Sheehan until Oct.  Please see if Dr. Lovell Sheehan wants to work her, treat by telephone or leave on Dr. Mar Daring schedule.

## 2012-03-16 NOTE — Telephone Encounter (Signed)
Per dr Lovell Sheehan- to gi referral- ptinformed

## 2012-03-16 NOTE — Telephone Encounter (Signed)
Spoke to the pt. Scheduled for 03/18/12 with BB.  Still having RLQ pain.  Pain is at a 10 on a scale from 1-10 when she has it.  Episodes have decreased since last appt with PC.

## 2012-03-16 NOTE — Addendum Note (Signed)
Addended by: Willy Eddy on: 03/16/2012 05:08 PM   Modules accepted: Orders

## 2012-03-17 ENCOUNTER — Telehealth: Payer: Self-pay | Admitting: Internal Medicine

## 2012-03-17 NOTE — Telephone Encounter (Signed)
Patient is being referred by Dr. Lovell Sheehan.  Per the office note from p. Orvan Falconer they were going to consider a [place].  I have given her an appt for 04/17/12 at 3:00.  She is asked to contact her primary care, let them know the earliest we can see her is 04/17/12 and we have added her to the cancellation list, and ask what else can be done until this appt.

## 2012-03-18 ENCOUNTER — Ambulatory Visit: Payer: BC Managed Care – PPO | Admitting: Family Medicine

## 2012-03-24 ENCOUNTER — Ambulatory Visit (INDEPENDENT_AMBULATORY_CARE_PROVIDER_SITE_OTHER): Payer: BC Managed Care – PPO | Admitting: Family

## 2012-03-24 ENCOUNTER — Encounter: Payer: Self-pay | Admitting: Family

## 2012-03-24 VITALS — BP 128/90 | Temp 98.4°F | Wt 189.0 lb

## 2012-03-24 DIAGNOSIS — E119 Type 2 diabetes mellitus without complications: Secondary | ICD-10-CM

## 2012-03-24 DIAGNOSIS — R1032 Left lower quadrant pain: Secondary | ICD-10-CM

## 2012-03-24 NOTE — Patient Instructions (Signed)
Diabetes and Exercise Regular exercise is important and can help:   Control blood glucose (sugar).   Decrease blood pressure.    Control blood lipids (cholesterol, triglycerides).   Improve overall health.  BENEFITS FROM EXERCISE  Improved fitness.   Improved flexibility.   Improved endurance.   Increased bone density.   Weight control.   Increased muscle strength.   Decreased body fat.   Improvement of the body's use of insulin, a hormone.   Increased insulin sensitivity.   Reduction of insulin needs.   Reduced stress and tension.   Helps you feel better.  People with diabetes who add exercise to their lifestyle gain additional benefits, including:  Weight loss.   Reduced appetite.   Improvement of the body's use of blood glucose.   Decreased risk factors for heart disease:   Lowering of cholesterol and triglycerides.   Raising the level of good cholesterol (high-density lipoproteins, HDL).   Lowering blood sugar.   Decreased blood pressure.  TYPE 1 DIABETES AND EXERCISE  Exercise will usually lower your blood glucose.   If blood glucose is greater than 240 mg/dl, check urine ketones. If ketones are present, do not exercise.   Location of the insulin injection sites may need to be adjusted with exercise. Avoid injecting insulin into areas of the body that will be exercised. For example, avoid injecting insulin into:   The arms when playing tennis.   The legs when jogging. For more information, discuss this with your caregiver.   Keep a record of:   Food intake.   Type and amount of exercise.   Expected peak times of insulin action.   Blood glucose levels.  Do this before, during, and after exercise. Review your records with your caregiver. This will help you to develop guidelines for adjusting food intake and insulin amounts.  TYPE 2 DIABETES AND EXERCISE  Regular physical activity can help control blood glucose.   Exercise is important  because it may:   Increase the body's sensitivity to insulin.   Improve blood glucose control.   Exercise reduces the risk of heart disease. It decreases serum cholesterol and triglycerides. It also lowers blood pressure.   Those who take insulin or oral hypoglycemic agents should watch for signs of hypoglycemia. These signs include dizziness, shaking, sweating, chills, and confusion.   Body water is lost during exercise. It must be replaced. This will help to avoid loss of body fluids (dehydration) or heat stroke.  Be sure to talk to your caregiver before starting an exercise program to make sure it is safe for you. Remember, any activity is better than none.  Document Released: 10/05/2003 Document Revised: 07/04/2011 Document Reviewed: 01/19/2009 Exit[employer] Patient Information 2012 [employer].   Diabetes Meal Planning Guide The diabetes meal planning guide is a tool to help you plan your meals and snacks. It is important for people with diabetes to manage their blood glucose (sugar) levels. Choosing the right foods and the right amounts throughout your day will help control your blood glucose. Eating right can even help you improve your blood pressure and reach or maintain a healthy weight. CARBOHYDRATE COUNTING MADE EASY When you eat carbohydrates, they turn to sugar. This raises your blood glucose level. Counting carbohydrates can help you control this level so you feel better. When you plan your meals by counting carbohydrates, you can have more flexibility in what you eat and balance your medicine with your food intake. Carbohydrate counting simply means adding up the total   amount of carbohydrate grams in your meals and snacks. Try to eat about the same amount at each meal. Foods with carbohydrates are listed below. Each portion below is 1 carbohydrate serving or 15 grams of carbohydrates. Ask your dietician how many grams of carbohydrates you should eat at each meal or snack. Grains  and Starches 1 slice bread.   English muffin or hotdog/hamburger bun.   cup cold cereal (unsweetened).  ? cup cooked pasta or rice.   cup starchy vegetables (corn, potatoes, peas, beans, winter squash).  1 tortilla (6 inches).   bagel.  1 waffle or pancake (size of a CD).   cup cooked cereal.  4 to 6 small crackers.  *Whole grain is recommended. Fruit 1 cup fresh unsweetened berries, melon, papaya, pineapple.  1 small fresh fruit.   banana or mango.   cup fruit juice (4 oz unsweetened).   cup canned fruit in natural juice or water.  2 tbs dried fruit.  12 to 15 grapes or cherries.  Milk and Yogurt 1 cup fat-free or 1% milk.  1 cup soy milk.  6 oz light yogurt with sugar-free sweetener.  6 oz low-fat soy yogurt.  6 oz plain yogurt.  Vegetables 1 cup raw or  cup cooked is counted as 0 carbohydrates or a "free" food.  If you eat 3 or more servings at 1 meal, count them as 1 carbohydrate serving.  Other Carbohydrates  oz chips or pretzels.   cup ice cream or frozen yogurt.   cup sherbet or sorbet.  2 inch square cake, no frosting.  1 tbs honey, sugar, jam, jelly, or syrup.  2 small cookies.  3 squares of graham crackers.  3 cups popcorn.  6 crackers.  1 cup broth-based soup.  Count 1 cup casserole or other mixed foods as 2 carbohydrate servings.  Foods with less than 20 calories in a serving may be counted as 0 carbohydrates or a "free" food.  You may want to purchase a book or computer software that lists the carbohydrate gram counts of different foods. In addition, the nutrition facts panel on the labels of the foods you eat are a good source of this information. The label will tell you how big the serving size is and the total number of carbohydrate grams you will be eating per serving. Divide this number by 15 to obtain the number of carbohydrate servings in a portion. Remember, 1 carbohydrate serving equals 15 grams of carbohydrate. SERVING SIZES Measuring  foods and serving sizes helps you make sure you are getting the right amount of food. The list below tells how big or small some common serving sizes are. 1 oz.........4 stacked dice.  3 oz.........Deck of cards.  1 tsp........Tip of little finger.  1 tbs........Thumb.  2 tbs........Golf ball.   cup.......Half of a fist.  1 cup........A fist.  SAMPLE DIABETES MEAL PLAN Below is a sample meal plan that includes foods from the grain and starches, dairy, vegetable, fruit, and meat groups. A dietician can individualize a meal plan to fit your calorie needs and tell you the number of servings needed from each food group. However, controlling the total amount of carbohydrates in your meal or snack is more important than making sure you include all of the food groups at every meal. You may interchange carbohydrate containing foods (dairy, starches, and fruits). The meal plan below is an example of a 2000 calorie diet using carbohydrate counting. This meal plan has 17 carbohydrate servings. Breakfast 1   cup oatmeal (2 carb servings).   cup light yogurt (1 carb serving).  1 cup blueberries (1 carb serving).   cup almonds.  Snack 1 large apple (2 carb servings).  1 low-fat string cheese stick.  Lunch Chicken breast salad.  1 cup spinach.   cup chopped tomatoes.  2 oz chicken breast, sliced.  2 tbs low-fat Italian dressing.  12 whole-wheat crackers (2 carb servings).  12 to 15 grapes (1 carb serving).  1 cup low-fat milk (1 carb serving).  Snack 1 cup carrots.   cup hummus (1 carb serving).  Dinner 3 oz broiled salmon.  1 cup brown rice (3 carb servings).  Snack 1  cups steamed broccoli (1 carb serving) drizzled with 1 tsp olive oil and lemon juice.  1 cup light pudding (2 carb servings).  DIABETES MEAL PLANNING WORKSHEET Your dietician can use this worksheet to help you decide how many servings of foods and what types of foods are right for you.  BREAKFAST Food Group and Servings /  Carb Servings Grain/Starches __________________________________ Dairy __________________________________________ Vegetable ______________________________________ Fruit ___________________________________________ Meat __________________________________________ Fat ____________________________________________ LUNCH Food Group and Servings / Carb Servings Grain/Starches ___________________________________ Dairy ___________________________________________ Fruit ____________________________________________ Meat ___________________________________________ Fat _____________________________________________ DINNER Food Group and Servings / Carb Servings Grain/Starches ___________________________________ Dairy ___________________________________________ Fruit ____________________________________________ Meat ___________________________________________ Fat _____________________________________________ SNACKS Food Group and Servings / Carb Servings Grain/Starches ___________________________________ Dairy ___________________________________________ Vegetable _______________________________________ Fruit ____________________________________________ Meat ___________________________________________ Fat _____________________________________________ DAILY TOTALS Starches _________________________ Vegetable ________________________ Fruit ____________________________ Dairy ____________________________ Meat ____________________________ Fat ______________________________ Document Released: 04/11/2005 Document Revised: 07/04/2011 Document Reviewed: 02/20/2009 ExitCare Patient Information 2012 [employer].    

## 2012-03-24 NOTE — Progress Notes (Signed)
Subjective:    Patient ID: Alexandra [name], female    DOB: May 29, 1961, 51 y.o.   MRN: [id number]  HPI 51 year old [place] American female, is in for hyperglycemia that was identified on her lab work last week. She has a history of type 2 diabetes that has been well-controlled over the last few years. Her A1c was found to be 6.9. She does not follow any particular diet or exercise currently.  Patient continues to have left lower quadrant abdominal pain. Her pain is improved about 80% since being on Bentyl and Vicodin. When she does not take the medication, the pain returns. She denies any diarrhea or constipation, no blood in her stools, dark black stools or foul-smelling stools.   Review of Systems  Constitutional: Negative.   HENT: Negative.   Respiratory: Negative.   Cardiovascular: Negative.   Gastrointestinal: Positive for abdominal pain. Negative for nausea, diarrhea, constipation, blood in stool and rectal pain.       Left lower quadrant pain-improved  Genitourinary: Negative.  Negative for urgency and frequency.  Musculoskeletal: Negative.   Neurological: Negative.   Hematological: Negative.   Psychiatric/Behavioral: Negative.    Past Medical History  Diagnosis Date  . GERD (gastroesophageal reflux disease)     prn med.  . Hypothyroidism   . Hypertension     new dx. - will start med. 06/28/2011  . Sleep apnea     prior to gastric bypass - no sleep study since bypass  . Rotator cuff tear, right     History   Social History  . Marital Status: Married    Spouse Name: N/A    Number of Children: N/A  . Years of Education: N/A   Occupational History  . Not on file.   Social History Main Topics  . Smoking status: Never Smoker   . Smokeless tobacco: Never Used  . Alcohol Use: No  . Drug Use: No  . Sexually Active: Not on file   Other Topics Concern  . Not on file   Social History Narrative  . No narrative on file    Past Surgical History  Procedure Date    . Cesarean section 1991  . Cholecystectomy 1989  . Shoulder arthroscopy distal clavicle excision and open rotator cuff repair 07/26/2010    right  . Shoulder arthroscopy 01/02/2006; 05/31/2004    left 2007; right 2005  . Exploratory laparotomy 05/08/2000    right salpingo-oophorectomy  . Abdominal hysterectomy 06/25/2000    left salpingo-oophorectomy  . 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 Laminotomy / excision disk posterior cervical spine 08/10/2009    C7-T1  . Thyroid surgery early 2000s    goiter  . Cardiac catheterization 04/01/2006  . Shoulder arthroscopy 07/02/2011    Procedure: ARTHROSCOPY SHOULDER;  Surgeon: Wyn Forster., MD;  Location: [facility];  Service: Orthopedics;  Laterality: Right;  repair supraspinatus, Debride glenohumeral joint, labrum  . Back surgery 1997    discetomy, lumbar spinal fusion    No family history on file.  Allergies  Allergen Reactions  . Shrimp Flavor Swelling    Swelling of tongue and rash  . Ace Inhibitors Cough  . Oxycodone-Acetaminophen Rash    Current Outpatient Prescriptions on File Prior to Visit  Medication Sig Dispense Refill  . bisoprolol-hydrochlorothiazide (ZIAC) 2.5-6.25 MG per tablet Take 1 tablet by mouth daily.  90 tablet  3  . diclofenac sodium (VOLTAREN) 1 % GEL Apply 1 application topically 4 (four) times daily.  100 g  0  . dicyclomine (BENTYL) 20 MG tablet Take 1 tablet (20 mg total) by mouth every 6 (six) hours.  30 tablet  1  . levothyroxine (SYNTHROID, LEVOTHROID) 150 MCG tablet Take 1 tablet (150 mcg total) by mouth daily.  90 tablet  3  . DISCONTD: DULoxetine (CYMBALTA) 20 MG capsule Take 20 mg by mouth daily. Samples per Dr Lovell Sheehan      . DISCONTD: gabapentin (NEURONTIN) 300 MG capsule Take 300 mg by mouth 2 (two) times daily. Per Dr Teressa Senter        BP 128/90  Temp 98.4 F (36.9 C) (Oral)  Wt 189 lb (85.73 kg)chart    Objective:   Physical Exam  Constitutional: She is oriented to person,  place, and time. She appears well-developed and well-nourished.  Neck: Normal range of motion. Neck supple.  Cardiovascular: Normal rate, regular rhythm and normal heart sounds.   Pulmonary/Chest: Effort normal and breath sounds normal.  Abdominal: Soft.       Left  Lower quadrant pain to palpation but no rebound tenderness of guarding.   Neurological: She is alert and oriented to person, place, and time.  Skin: Skin is warm and dry.  Psychiatric: She has a normal mood and affect.          Assessment & Plan:  Assessment: Type 2 Diabetes, Left Lower Quad Pain  Plan: Ct scan of the abdomen. Refer to GI. Life style modifications to decrease blood sugars x 3 months. We will recheck and determine if she needs medication. Call the office if symptoms worsen or persist.

## 2012-03-25 NOTE — Addendum Note (Signed)
Addended byAdline Mango B on: 03/25/2012 10:15 AM   Modules accepted: Orders

## 2012-03-27 ENCOUNTER — Ambulatory Visit (INDEPENDENT_AMBULATORY_CARE_PROVIDER_SITE_OTHER)
Admission: RE | Admit: 2012-03-27 | Discharge: 2012-03-27 | Disposition: A | Discharge status: Home / Self Care | Payer: BC Managed Care – PPO | Source: Ambulatory Visit | Attending: Family | Admitting: Family

## 2012-03-27 DIAGNOSIS — R1032 Left lower quadrant pain: Secondary | ICD-10-CM

## 2012-03-27 DIAGNOSIS — R1031 Right lower quadrant pain: Secondary | ICD-10-CM

## 2012-03-27 MED ORDER — IOHEXOL 300 MG/ML  SOLN
100.0000 mL | Freq: Once | INTRAMUSCULAR | Status: AC | PRN
  Administered 2012-03-27: 100 mL via INTRAVENOUS

## 2012-03-31 ENCOUNTER — Encounter: Payer: Self-pay | Admitting: *Deleted

## 2012-04-17 ENCOUNTER — Ambulatory Visit: Payer: BC Managed Care – PPO | Admitting: Internal Medicine

## 2012-04-24 ENCOUNTER — Ambulatory Visit (INDEPENDENT_AMBULATORY_CARE_PROVIDER_SITE_OTHER): Payer: BC Managed Care – PPO | Admitting: Family Medicine

## 2012-04-24 ENCOUNTER — Encounter: Payer: Self-pay | Admitting: Family Medicine

## 2012-04-24 VITALS — BP 138/88 | Temp 98.5°F | Wt 192.0 lb

## 2012-04-24 DIAGNOSIS — Z299 Encounter for prophylactic measures, unspecified: Secondary | ICD-10-CM

## 2012-04-24 DIAGNOSIS — Z23 Encounter for immunization: Secondary | ICD-10-CM

## 2012-04-24 DIAGNOSIS — R3989 Other symptoms and signs involving the genitourinary system: Secondary | ICD-10-CM

## 2012-04-24 DIAGNOSIS — A088 Other specified intestinal infections: Secondary | ICD-10-CM

## 2012-04-24 DIAGNOSIS — R39198 Other difficulties with micturition: Secondary | ICD-10-CM

## 2012-04-24 DIAGNOSIS — A084 Viral intestinal infection, unspecified: Secondary | ICD-10-CM

## 2012-04-24 LAB — POCT URINALYSIS DIPSTICK
Bilirubin, UA: NEGATIVE
Glucose, UA: NEGATIVE
Leukocytes, UA: NEGATIVE
Nitrite, UA: NEGATIVE

## 2012-04-24 MED ORDER — ONDANSETRON HCL 4 MG PO TABS: 4.0000 mg | ORAL_TABLET | Freq: Two times a day (BID) | ORAL | Status: DC | PRN

## 2012-04-24 NOTE — Patient Instructions (Signed)
VIRAL GASTROENTERITIS:  -drink plenty of fluids (at least 6-7 large glasses per day) and if you are not eating much drink broth or gatorade  -try to eat small light meals if you can - AVOID DAIRY for the next week  -you can use loperamide (you can buy this at the pharmacy - follow instructions on package) for the cramping and loose stools or diarrhea  -use nausea medication only if needed per instructions  -follow up if worsening symptoms or not better in 5 days

## 2012-04-24 NOTE — Progress Notes (Signed)
Chief Complaint  Patient presents with  . Diarrhea    HPI:  Diarrhea: -started 2-3 days ago -frequency loose and somewhat explosive bowel movement (5-6 times daily), nausea, some R and L upper abdominal pain -Denies: blood in stools, melena, fevers, chills -urinating, but not as much as usual   ROS: See pertinent positives and negatives per HPI.  Past Medical History  Diagnosis Date  . GERD (gastroesophageal reflux disease)     prn med.  . Hypothyroidism   . Hypertension     new dx. - will start med. 06/28/2011  . Sleep apnea     prior to gastric bypass - no sleep study since bypass  . Rotator cuff tear, right   . Diabetes mellitus   . Hyperlipidemia   . Hiatal hernia 1998  . Hyperplastic colon polyp   . Fatty liver     Family History  Problem Relation Age of Onset  . Coronary artery disease      History   Social History  . Marital Status: Married    Spouse Name: N/A    Number of Children: N/A  . Years of Education: N/A   Social History Main Topics  . Smoking status: Never Smoker   . Smokeless tobacco: Never Used  . Alcohol Use: No  . Drug Use: No  . Sexually Active: None   Other Topics Concern  . None   Social History Narrative  . None    Current outpatient prescriptions:bisoprolol-hydrochlorothiazide (ZIAC) 2.5-6.25 MG per tablet, Take 1 tablet by mouth daily., Disp: 90 tablet, Rfl: 3;  diclofenac sodium (VOLTAREN) 1 % GEL, Apply 1 application topically 4 (four) times daily., Disp: 100 g, Rfl: 0;  dicyclomine (BENTYL) 20 MG tablet, Take 1 tablet (20 mg total) by mouth every 6 (six) hours., Disp: 30 tablet, Rfl: 1 levothyroxine (SYNTHROID, LEVOTHROID) 150 MCG tablet, Take 1 tablet (150 mcg total) by mouth daily., Disp: 90 tablet, Rfl: 3;  ondansetron (ZOFRAN) 4 MG tablet, Take 1 tablet (4 mg total) by mouth every 12 (twelve) hours as needed for nausea., Disp: 10 tablet, Rfl: 0;  DISCONTD: DULoxetine (CYMBALTA) 20 MG capsule, Take 20 mg by mouth daily.  Samples per Dr Lovell Sheehan, Disp: , Rfl:  DISCONTD: gabapentin (NEURONTIN) 300 MG capsule, Take 300 mg by mouth 2 (two) times daily. Per Dr Teressa Senter, Disp: , Rfl:   EXAM:  Filed Vitals:   04/24/12 1609  BP: 138/88  Temp: 98.5 F (36.9 C)    There is no height on file to calculate BMI.  GENERAL: vitals reviewed and listed below, alert, oriented, appears well hydrated and in no acute distress  HEENT: atraumatic, conjucntiva clear, no obvious abnormalities on inspection of external nose and ears  NECK: no masses on inspection  LUNGS: clear to auscultation bilaterally, no wheezes, rales or rhonchi, good air movement  CV: HRRR, no peripheral edema  ABD: BS+, Soft, no masses, neg rebound, neg guarding  MS: moves all extremities without noticeable abnormality  PSYCH: pleasant and cooperative, no obvious depression or anxiety  ASSESSMENT AND PLAN:  Discussed the following assessment and plan:  1. Difficulty urinating  POCT urinalysis dipstick  2. Viral gastroenteritis    3. Preventive measure  Flu vaccine greater than or equal to 3yo preservative free IM   -signs and symptoms c/w viral gastroenteritis with mild dehydration -advised oral rehydration and supportive care per recs below -return precautions   Orders Placed This Encounter  Procedures  . Flu vaccine greater than or equal to  3yo preservative free IM  . POCT urinalysis dipstick    Patient Instructions  VIRAL GASTROENTERITIS:  -drink plenty of fluids (at least 6-7 large glasses per day) and if you are not eating much drink broth or gatorade  -try to eat small light meals if you can - AVOID DAIRY for the next week  -you can use loperamide (you can buy this at the pharmacy - follow instructions on package) for the cramping and loose stools or diarrhea  -use nausea medication only if needed per instructions  -follow up if worsening symptoms or not better in 5 days      Return to clinic immediately if symptoms  worsen or persist or new concerns.  No Follow-up on file.  Kriste Basque R.

## 2012-04-30 ENCOUNTER — Telehealth: Payer: Self-pay | Admitting: Internal Medicine

## 2012-04-30 NOTE — Telephone Encounter (Signed)
Message copied by Arna Snipe on Thu Apr 30, 2012  8:48 AM ------      Message from: Richardson Chiquito      Created: Mon Apr 20, 2012  2:13 PM                   ----- Message -----         From: Hart Carwin, MD         Sent: 04/20/2012   1:23 PM           To: Richardson Chiquito, CMA            Charge no show fee. DB      ----- Message -----         From: Richardson Chiquito, CMA         Sent: 04/20/2012   9:09 AM           To: Hart Carwin, MD            Dr Juanda Chance-      Patient no showed appt on 04/17/12. Do you want to charge no show fee?

## 2012-05-25 ENCOUNTER — Other Ambulatory Visit: Payer: Self-pay | Admitting: Internal Medicine

## 2012-06-24 ENCOUNTER — Ambulatory Visit: Payer: BC Managed Care – PPO | Admitting: Internal Medicine

## 2012-09-16 ENCOUNTER — Other Ambulatory Visit: Payer: Self-pay | Admitting: Internal Medicine

## 2012-09-16 DIAGNOSIS — Z1231 Encounter for screening mammogram for malignant neoplasm of breast: Secondary | ICD-10-CM

## 2012-10-14 ENCOUNTER — Ambulatory Visit
Admission: RE | Admit: 2012-10-14 | Discharge: 2012-10-14 | Disposition: A | Discharge status: Home / Self Care | Payer: BC Managed Care – PPO | Source: Ambulatory Visit | Attending: Internal Medicine | Admitting: Internal Medicine

## 2012-10-14 DIAGNOSIS — Z1231 Encounter for screening mammogram for malignant neoplasm of breast: Secondary | ICD-10-CM

## 2012-10-26 ENCOUNTER — Encounter: Payer: Self-pay | Admitting: Family Medicine

## 2012-10-26 ENCOUNTER — Ambulatory Visit (INDEPENDENT_AMBULATORY_CARE_PROVIDER_SITE_OTHER): Payer: BC Managed Care – PPO | Admitting: Family Medicine

## 2012-10-26 VITALS — BP 132/88 | HR 87 | Temp 98.3°F | Wt 191.0 lb

## 2012-10-26 DIAGNOSIS — F489 Nonpsychotic mental disorder, unspecified: Secondary | ICD-10-CM

## 2012-10-26 DIAGNOSIS — J309 Allergic rhinitis, unspecified: Secondary | ICD-10-CM

## 2012-10-26 MED ORDER — FLUTICASONE PROPIONATE 50 MCG/ACT NA SUSP: 2.0000 | Freq: Every day | NASAL | Status: DC

## 2012-10-26 NOTE — Patient Instructions (Addendum)
-  take flonase and allegra every day  -contact your neurologist about your neuropathy  -follow up with your doctor in 1-2 months

## 2012-10-26 NOTE — Progress Notes (Signed)
Chief Complaint  Patient presents with  . Cough    congestion, stomach cramps     HPI:  Acute visit for cough and congestion: -started 2-3 weeks ago -symptoms: cough at night, drainage in throat, sneezing, wheezing, has allergy issues too - takes allegra intermittently -denies: fevers, chills, SOB  Chronic abd pain: -had some stomach cramping yesterday - now resolved and feels better today -denies: vomiting, diarrhea, fevers, chills, blood in stools -tx with bentyl in the past and referred to GI per PCP on review of chart - appears no showed that appt -reports had been feeling fine for some time  Neuropathy in feet: -takes gabapentin for this -sees neurology for this   ROS: See pertinent positives and negatives per HPI.  Past Medical History  Diagnosis Date  . GERD (gastroesophageal reflux disease)     prn med.  . Hypothyroidism   . Hypertension     new dx. - will start med. 06/28/2011  . Sleep apnea     prior to gastric bypass - no sleep study since bypass  . Rotator cuff tear, right   . Diabetes mellitus   . Hyperlipidemia   . Hiatal hernia 1998  . Hyperplastic colon polyp   . Fatty liver     Family History  Problem Relation Age of Onset  . Coronary artery disease      History   Social History  . Marital Status: Married    Spouse Name: N/A    Number of Children: N/A  . Years of Education: N/A   Social History Main Topics  . Smoking status: Never Smoker   . Smokeless tobacco: Never Used  . Alcohol Use: No  . Drug Use: No  . Sexually Active: None   Other Topics Concern  . None   Social History Narrative  . None    Current outpatient prescriptions:bisoprolol-hydrochlorothiazide (ZIAC) 2.5-6.25 MG per tablet, Take 1 tablet by mouth daily., Disp: 90 tablet, Rfl: 3;  diclofenac sodium (VOLTAREN) 1 % GEL, Apply 1 application topically 4 (four) times daily., Disp: 100 g, Rfl: 0;  dicyclomine (BENTYL) 20 MG tablet, Take 1 tablet (20 mg total) by mouth  every 6 (six) hours., Disp: 30 tablet, Rfl: 1 levothyroxine (SYNTHROID, LEVOTHROID) 150 MCG tablet, Take 1 tablet (150 mcg total) by mouth daily., Disp: 90 tablet, Rfl: 3;  levothyroxine (SYNTHROID, LEVOTHROID) 150 MCG tablet, TAKE 1 TABLET EVERY DAY, Disp: 30 tablet, Rfl: 1;  ondansetron (ZOFRAN) 4 MG tablet, Take 1 tablet (4 mg total) by mouth every 12 (twelve) hours as needed for nausea., Disp: 10 tablet, Rfl: 0 fluticasone (FLONASE) 50 MCG/ACT nasal spray, Place 2 sprays into the nose daily., Disp: 16 g, Rfl: 6;  [DISCONTINUED] DULoxetine (CYMBALTA) 20 MG capsule, Take 20 mg by mouth daily. Samples per Dr Lovell Sheehan, Disp: , Rfl: ;  [DISCONTINUED] gabapentin (NEURONTIN) 300 MG capsule, Take 300 mg by mouth 2 (two) times daily. Per Dr Teressa Senter, Disp: , Rfl:   EXAM:  Filed Vitals:   10/26/12 1410  BP: 132/88  Pulse: 87  Temp: 98.3 F (36.8 C)    Body mass index is 34.36 kg/(m^2).  GENERAL: vitals reviewed and listed above, alert, oriented, appears well hydrated and in no acute distress  HEENT: atraumatic, conjunttiva clear, no obvious abnormalities on inspection of external nose and ears, normal appearance of ear canals and TMs, clear nasal congestion with boggy turbinates, mild post oropharyngeal erythema with PND and cobblestoning, no tonsillar edema or exudate, no sinus TTP  NECK:  excessive tissue ant neck - pt reports has had this since thyroid surgery and was told is scar tissue  LUNGS: clear to auscultation bilaterally, no wheezes, rales or rhonchi, good air movement  ABD: soft, BS+, NTTP  CV: HRRR, no peripheral edema  MS: moves all extremities without noticeable abnormality  PSYCH: pleasant and cooperative, no obvious depression or anxiety  ASSESSMENT AND PLAN:  Discussed the following assessment and plan:  Allergic rhinitis - Plan: fluticasone (FLONASE) 50 MCG/ACT nasal spray  NEUROSIS,NOS  -feel cough is most like related to allergies/PND - advised allegra and flonase  and follow up with PCP -abd pain better today, hx of chronic pain and advised if this becomes persistent again she should reschedule GI appt, discussed possible IBS -for her neuropathy she reports this is being treated by neurology and I advised her to contact her neurologist about persistent symproms -needs routine follow up with PCP - pt will schedule -Patient advised to return or notify a doctor immediately if symptoms worsen or persist or new concerns arise.  Patient Instructions  -take flonase and allegra every day  -contact your neurologist about your neuropathy  -follow up with your doctor in 1-2 months     Derryck Shahan, [facility] R.

## 2012-12-10 ENCOUNTER — Emergency Department (INDEPENDENT_AMBULATORY_CARE_PROVIDER_SITE_OTHER): Payer: BC Managed Care – PPO

## 2012-12-10 ENCOUNTER — Emergency Department (HOSPITAL_COMMUNITY)
Admission: EM | Admit: 2012-12-10 | Discharge: 2012-12-10 | Disposition: A | Discharge status: Home / Self Care | Payer: BC Managed Care – PPO | Source: Home / Self Care | Attending: Emergency Medicine | Admitting: Emergency Medicine

## 2012-12-10 ENCOUNTER — Encounter (HOSPITAL_COMMUNITY): Payer: Self-pay | Admitting: *Deleted

## 2012-12-10 DIAGNOSIS — S7000XA Contusion of unspecified hip, initial encounter: Secondary | ICD-10-CM

## 2012-12-10 DIAGNOSIS — S7001XA Contusion of right hip, initial encounter: Secondary | ICD-10-CM

## 2012-12-10 DIAGNOSIS — H8110 Benign paroxysmal vertigo, unspecified ear: Secondary | ICD-10-CM

## 2012-12-10 DIAGNOSIS — R079 Chest pain, unspecified: Secondary | ICD-10-CM

## 2012-12-10 LAB — POCT I-STAT, CHEM 8
Chloride: 103 mEq/L (ref 96–112)
HCT: 41 % (ref 36.0–46.0)
Potassium: 3.7 mEq/L (ref 3.5–5.1)
Sodium: 143 mEq/L (ref 135–145)

## 2012-12-10 MED ORDER — IBUPROFEN 800 MG PO TABS
ORAL_TABLET | ORAL | Status: AC
  Filled 2012-12-10: qty 1

## 2012-12-10 MED ORDER — MECLIZINE HCL 25 MG PO TABS: 25.0000 mg | ORAL_TABLET | Freq: Four times a day (QID) | ORAL | Status: DC

## 2012-12-10 MED ORDER — HYDROCODONE-ACETAMINOPHEN 5-325 MG PO TABS
ORAL_TABLET | ORAL | Status: AC
  Filled 2012-12-10: qty 1

## 2012-12-10 MED ORDER — IBUPROFEN 800 MG PO TABS
800.0000 mg | ORAL_TABLET | Freq: Once | ORAL | Status: AC
  Administered 2012-12-10: 800 mg via ORAL

## 2012-12-10 MED ORDER — HYDROCODONE-ACETAMINOPHEN 5-325 MG PO TABS
1.0000 | ORAL_TABLET | Freq: Once | ORAL | Status: AC
  Administered 2012-12-10: 1 via ORAL

## 2012-12-10 MED ORDER — NAPROXEN 500 MG PO TABS: 500.0000 mg | ORAL_TABLET | Freq: Two times a day (BID) | ORAL | Status: DC

## 2012-12-10 MED ORDER — HYDROCODONE-ACETAMINOPHEN 5-325 MG PO TABS: ORAL_TABLET | ORAL | Status: DC

## 2012-12-10 NOTE — ED Notes (Signed)
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

## 2012-12-10 NOTE — ED Provider Notes (Signed)
Chief Complaint:   Chief Complaint  Patient presents with  . Chest Pain    History of Present Illness:   Alexandra [name] is a 52 year old female who comes in today with multiple complaints: Vertigo, right hip injury, generalized weakness, chest pain, blurred vision, headache, and many others. The patient developed lightheadedness, and whirling vertigo this morning. It would come and go and would get worse if she would stand up or changed positions. She reports that the symptoms only would last a few seconds at a time, but would come and go. She felt dizzy and staggering. She fell into a railing at work and injured her right hip. The pain is rated as 7-1/2 in intensity and radiates down the thigh towards the knee. She is ambulatory but walks with a limp. She has had chronic weakness in the right arm and chronic right shoulder pain but this is nothing new. Last night she states she had an episode of chest pain that lasted about 10 minutes. It was substernal and radiated to her back and would get worse if she would take a deep breath. She has had similar pain before as well as palpitations. She's been seen by Dr. Antoine Poche at [facility]. She had a workup there to include a scan of some type and also a monitor which were negative. Also today she complains of blurry vision like a cloud is over her vision but denies any diplopia. 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 She denies any fever, chills, nasal congestion, rhinorrhea, sore throat, shortness of breath, diaphoresis, nausea, vomiting, or paresthesias.  Review of Systems:  Other than noted above, the patient denies any of the following symptoms. Systemic:  No fever, chills, sweats, fatigue, myalgias, headache, or anorexia. Eye:  No redness, pain or drainage. ENT:  No earache, nasal congestion, rhinorrhea, sinus pressure, or sore throat. Lungs:  No cough, sputum  production, wheezing, shortness of breath.  Cardiovascular:  No chest pain, palpitations, or syncope. GI:  No nausea, vomiting, abdominal pain or diarrhea. GU:  No dysuria, frequency, or hematuria. Skin:  No rash or pruritis.  PMFSH:  Past medical history, family history, social history, meds, and allergies were reviewed.  She is allergic to ACE inhibitors and shrimp. She has high blood pressure and hypothyroidism. She takes Ziac and Synthroid.  Physical Exam:   Vital signs:  BP 115/74  Pulse 75  Temp(Src) 98.1 F (36.7 C) (Oral)  Resp 17  SpO2 100% General:  Alert, in no distress. Eye:  PERRL, full EOMs.  Lids and conjunctivas were normal. ENT:  TMs and canals were normal, without erythema or inflammation.  Nasal mucosa was clear and uncongested, without drainage.  Mucous membranes were moist.  Pharynx was clear, without exudate or drainage.  There were no oral ulcerations or lesions. Neck:  Supple, no adenopathy, tenderness or mass. Thyroid was normal. Lungs:  No respiratory distress.  Lungs were clear to auscultation, without wheezes, rales or rhonchi.  Breath sounds were clear and equal bilaterally. Heart:  Regular rhythm, without gallops, murmers or rubs. Abdomen:  Soft, flat, and non-tender to palpation.  No hepatosplenomagaly or mass. Extremities: No edema, pulses full, there is moderate pain to palpation over her hip but full range of motion the joint with slight pain. Neuro exam: Alert and oriented x3, speech is clear and appropriate. There is no pronator drift, no focal muscular weakness, finger to nose was normal. Cranial  nerves were intact. The Dix-Hallpike maneuver was positive with the right ear down. Skin:  Clear, warm, and dry, without rash or lesions.  Labs:   Results for orders placed during the hospital encounter of 12/10/12  POCT I-STAT, CHEM 8      Result Value Range   Sodium 143  135 - 145 mEq/L   Potassium 3.7  3.5 - 5.1 mEq/L   Chloride 103  96 - 112 mEq/L    BUN 6  6 - 23 mg/dL   Creatinine, Ser 1.61  0.50 - 1.10 mg/dL   Glucose, Bld 096 (*) 70 - 99 mg/dL   Calcium, Ion 0.45  4.09 - 1.23 mmol/L   TCO2 29  0 - 100 mmol/L   Hemoglobin 13.9  12.0 - 15.0 g/dL   HCT 81.1  91.4 - 78.2 %     Radiology:  Dg Hip Complete Right  12/10/2012   *RADIOLOGY REPORT*  Clinical Data: History of injury from fall with pain.  RIGHT HIP - COMPLETE 2+ VIEW  Comparison: CT 03/27/2012.  Findings: There is obesity. There is no evidence of fracture or dislocation.  Minimal acetabular spurring is present. Previous lower lumbar surgery been performed. No calcific bursitis is seen.  IMPRESSION: No fracture or dislocation is evident.  Minimal acetabular spurring.  Obesity.   Original Report Authenticated By: Onalee Hua Call   EKG Results:  Date: 12/10/2012  Rate: 69  Rhythm: normal sinus rhythm  QRS Axis: normal  Intervals: normal  ST/T Wave abnormalities: normal  Conduction Disutrbances:none  Narrative Interpretation: Normal sinus rhythm, normal EKG.  Old EKG Reviewed: none available  Course in Urgent Care Center:   She was given ibuprofen 800 mg by mouth and Norco 5/325 for the pain.  Assessment:  The primary encounter diagnosis was Benign positional vertigo. Diagnoses of Contusion of right hip, initial encounter and Chest pain were also pertinent to this visit.  I don't think her chest pain was cardiac. She has normal EKG, I did advise that she followup with her cardiologist within the next week.  Plan:   1.  The following meds were prescribed:   Discharge Medication List as of 12/10/2012 12:39 PM    START taking these medications   Details  HYDROcodone-acetaminophen (NORCO/VICODIN) 5-325 MG per tablet 1 to 2 tabs every 4 to 6 hours as needed for pain., Print    meclizine (ANTIVERT) 25 MG tablet Take 1 tablet (25 mg total) by mouth 4 (four) times daily., Starting 12/10/2012, Until Discontinued, Normal    naproxen (NAPROSYN) 500 MG tablet Take 1 tablet (500 mg total)  by mouth 2 (two) times daily., Starting 12/10/2012, Until Discontinued, Normal       2.  The patient was instructed in symptomatic care and handouts were given. 3.  The patient was told to return if becoming worse in any way, if no better in 3 or 4 days, and given some red flag symptoms such as persistent chest pain or new neurological symptoms that would indicate earlier return. 4.  Follow up with Dr. Antoine Poche within the next week for the chest pain. She was told that if she had chest pain that lasted for more than 10-15 minutes that she should call 911 and go to [facility] via EMS.       Reuben Likes, MD 12/10/12 [phone]

## 2012-12-11 ENCOUNTER — Other Ambulatory Visit: Payer: Self-pay | Admitting: Internal Medicine

## 2012-12-16 ENCOUNTER — Encounter: Payer: Self-pay | Admitting: Internal Medicine

## 2012-12-16 ENCOUNTER — Ambulatory Visit (INDEPENDENT_AMBULATORY_CARE_PROVIDER_SITE_OTHER): Payer: BC Managed Care – PPO | Admitting: Internal Medicine

## 2012-12-16 VITALS — BP 130/80 | HR 80 | Temp 98.2°F | Resp 16 | Ht 62.5 in | Wt 194.0 lb

## 2012-12-16 DIAGNOSIS — E039 Hypothyroidism, unspecified: Secondary | ICD-10-CM

## 2012-12-16 DIAGNOSIS — T887XXA Unspecified adverse effect of drug or medicament, initial encounter: Secondary | ICD-10-CM

## 2012-12-16 DIAGNOSIS — IMO0001 Reserved for inherently not codable concepts without codable children: Secondary | ICD-10-CM

## 2012-12-16 DIAGNOSIS — IMO0002 Reserved for concepts with insufficient information to code with codable children: Secondary | ICD-10-CM

## 2012-12-16 DIAGNOSIS — E049 Nontoxic goiter, unspecified: Secondary | ICD-10-CM

## 2012-12-16 DIAGNOSIS — M5410 Radiculopathy, site unspecified: Secondary | ICD-10-CM

## 2012-12-16 LAB — CBC WITH DIFFERENTIAL/PLATELET
Basophils Relative: 0.2 % (ref 0.0–3.0)
Eosinophils Relative: 2.1 % (ref 0.0–5.0)
HCT: 41.3 % (ref 36.0–46.0)
Lymphs Abs: 3.2 10*3/uL (ref 0.7–4.0)
MCV: 89.1 fl (ref 78.0–100.0)
Monocytes Absolute: 0.8 10*3/uL (ref 0.1–1.0)
Neutro Abs: 4.7 10*3/uL (ref 1.4–7.7)
RBC: 4.64 Mil/uL (ref 3.87–5.11)
WBC: 8.8 10*3/uL (ref 4.5–10.5)

## 2012-12-16 LAB — T3, FREE: T3, Free: 2.3 pg/mL (ref 2.3–4.2)

## 2012-12-16 LAB — TSH: TSH: 7.9 u[IU]/mL — ABNORMAL HIGH (ref 0.35–5.50)

## 2012-12-16 MED ORDER — METFORMIN HCL 500 MG PO TABS: 500.0000 mg | ORAL_TABLET | Freq: Two times a day (BID) | ORAL | Status: DC

## 2012-12-16 MED ORDER — LIDOCAINE 5 % EX PTCH: 3.0000 | MEDICATED_PATCH | CUTANEOUS | Status: DC

## 2012-12-16 MED ORDER — LEVOTHYROXINE SODIUM 150 MCG PO TABS: ORAL_TABLET | ORAL | Status: DC

## 2012-12-16 NOTE — Patient Instructions (Signed)
You will be called for MRI

## 2012-12-16 NOTE — Progress Notes (Signed)
  Subjective:    Patient ID: Alexandra [name], female    DOB: 09-08-1960, 52 y.o.   MRN: [id number]  Hyperlipidemia Associated symptoms include myalgias. Pertinent negatives include no shortness of breath.  Diabetes Pertinent negatives for hypoglycemia include no dizziness or headaches. Pertinent negatives for diabetes include no fatigue and no weakness.   Having pain in right leg of uncertain cause. MRI planned.  Gabapentin did no help the pain. Pain with walking ( not with standing or sitting) Has hip OA Neurologist noted A1C and tsh with goiter Has multifactorial leg and hip pain  Episode of dizzyness ( on naprosyn) Required hydrocodone  for sleep    Review of Systems  Constitutional: Negative for activity change, appetite change and fatigue.  HENT: Negative for ear pain, congestion, neck pain, postnasal drip and sinus pressure.   Eyes: Negative for redness and visual disturbance.  Respiratory: Negative for cough, shortness of breath and wheezing.   Gastrointestinal: Negative for abdominal pain and abdominal distention.  Genitourinary: Negative for dysuria, frequency and menstrual problem.  Musculoskeletal: Positive for myalgias, back pain, joint swelling and gait problem. Negative for arthralgias.  Skin: Negative for rash and wound.  Neurological: Negative for dizziness, weakness and headaches.  Hematological: Negative for adenopathy. Does not bruise/bleed easily.  Psychiatric/Behavioral: Negative for sleep disturbance and decreased concentration.       Objective:   Physical Exam  Nursing note and vitals reviewed. Constitutional: She appears well-developed and well-nourished.  Eyes: Conjunctivae are normal. Pupils are equal, round, and reactive to light.  Neck: Normal range of motion. Neck supple.  Cardiovascular: Normal rate and regular rhythm.   Pulmonary/Chest: Effort normal and breath sounds normal.  Musculoskeletal: She exhibits tenderness.  Shoulder pain           Assessment & Plan:  Probable radicular pain down right leg Did not get the MRI Needs to be referred to [facility] but will need MRI first

## 2012-12-19 ENCOUNTER — Ambulatory Visit (HOSPITAL_BASED_OUTPATIENT_CLINIC_OR_DEPARTMENT_OTHER)
Admission: RE | Admit: 2012-12-19 | Discharge: 2012-12-19 | Disposition: A | Discharge status: Home / Self Care | Payer: BC Managed Care – PPO | Source: Ambulatory Visit | Attending: Internal Medicine | Admitting: Internal Medicine

## 2012-12-19 DIAGNOSIS — M545 Low back pain, unspecified: Secondary | ICD-10-CM | POA: Insufficient documentation

## 2012-12-19 DIAGNOSIS — M47817 Spondylosis without myelopathy or radiculopathy, lumbosacral region: Secondary | ICD-10-CM | POA: Insufficient documentation

## 2012-12-19 DIAGNOSIS — M5410 Radiculopathy, site unspecified: Secondary | ICD-10-CM

## 2012-12-19 DIAGNOSIS — M5124 Other intervertebral disc displacement, thoracic region: Secondary | ICD-10-CM | POA: Insufficient documentation

## 2012-12-19 DIAGNOSIS — R209 Unspecified disturbances of skin sensation: Secondary | ICD-10-CM | POA: Insufficient documentation

## 2012-12-19 DIAGNOSIS — Z981 Arthrodesis status: Secondary | ICD-10-CM | POA: Insufficient documentation

## 2012-12-23 ENCOUNTER — Telehealth: Payer: Self-pay | Admitting: Internal Medicine

## 2012-12-23 NOTE — Telephone Encounter (Signed)
Patient called stating that she would like a call back with MRI results. Please assist.  °

## 2012-12-23 NOTE — Telephone Encounter (Signed)
Pt informed it will be Friday when dr Lovell Sheehan returns for him to read.

## 2012-12-24 ENCOUNTER — Telehealth: Payer: Self-pay | Admitting: *Deleted

## 2012-12-24 NOTE — Telephone Encounter (Signed)
ppppt will be called tomorrow

## 2012-12-24 NOTE — Telephone Encounter (Signed)
Pt aware it will be Friday-5-30 when dr Lovell Sheehan will read- will call pt with results then

## 2012-12-25 ENCOUNTER — Other Ambulatory Visit: Payer: Self-pay | Admitting: *Deleted

## 2012-12-25 DIAGNOSIS — IMO0002 Reserved for concepts with insufficient information to code with codable children: Secondary | ICD-10-CM

## 2012-12-26 ENCOUNTER — Emergency Department (HOSPITAL_COMMUNITY)
Admission: EM | Admit: 2012-12-26 | Discharge: 2012-12-26 | Disposition: A | Discharge status: Home / Self Care | Payer: BC Managed Care – PPO | Source: Home / Self Care

## 2012-12-26 ENCOUNTER — Encounter (HOSPITAL_COMMUNITY): Payer: Self-pay | Admitting: Emergency Medicine

## 2012-12-26 DIAGNOSIS — IMO0002 Reserved for concepts with insufficient information to code with codable children: Secondary | ICD-10-CM

## 2012-12-26 DIAGNOSIS — M5416 Radiculopathy, lumbar region: Secondary | ICD-10-CM

## 2012-12-26 MED ORDER — METHYLPREDNISOLONE 4 MG PO KIT: PACK | ORAL | Status: DC

## 2012-12-26 MED ORDER — HYDROCODONE-ACETAMINOPHEN 7.5-325 MG PO TABS: 1.0000 | ORAL_TABLET | ORAL | Status: DC | PRN

## 2012-12-26 NOTE — ED Notes (Signed)
Pt c/o right hip/leg and foot pain. Pt states recently had mri done. Not able to specialist until next week.  Pt states that rx meds and otc meds are not helping with pain. Pt states pain is constant.  Denies injury.

## 2012-12-26 NOTE — ED Provider Notes (Signed)
History     CSN: [id number]  Arrival date & time 12/26/12  1515   None     Chief Complaint  Patient presents with  . Hip Pain    right hip and right foot pain  . Foot Pain    (Consider location/radiation/quality/duration/timing/severity/associated sxs/prior treatment) HPI Comments: 52 year old female presents with low back pain is radiating to her right leg and foot. It started approximately 2 weeks ago. It has been getting worse over the past several days. She saw her physician about a week ago and was prescribed lidocaine patches and Naprosyn. She states these are no longer working. The pain is located in the right para lumbosacral area of the back radiates posteriorly along the buttock and down the thigh and lower extremity. The pain is not worse with weightbearing or ambulation. She is to be worse with sitting. This pain is similar to the pain she had approximately 15 years ago just prior to having surgery for disc injury. She states she is scheduled to have an MRI next week. She is unable to tolerate the pain until that time.   Past Medical History  Diagnosis Date  . GERD (gastroesophageal reflux disease)     prn med.  . Hypothyroidism   . Hypertension     new dx. - will start med. 06/28/2011  . Sleep apnea     prior to gastric bypass - no sleep study since bypass  . Rotator cuff tear, right   . Diabetes mellitus   . Hyperlipidemia   . Hiatal hernia 1998  . Hyperplastic colon polyp   . Fatty liver     Past Surgical History  Procedure Laterality Date  . Cesarean section  1991  . Cholecystectomy  1989  . Shoulder arthroscopy distal clavicle excision and open rotator cuff repair  07/26/2010    right  . Shoulder arthroscopy  01/02/2006; 05/31/2004    left 2007; right 2005  . Exploratory laparotomy  05/08/2000    right salpingo-oophorectomy  . Abdominal hysterectomy  06/25/2000    left salpingo-oophorectomy  . 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 Laminotomy / excision  disk posterior cervical spine  08/10/2009    C7-T1  . Thyroid surgery  early 2000s    goiter  . Cardiac catheterization  04/01/2006  . Shoulder arthroscopy  07/02/2011    Procedure: ARTHROSCOPY SHOULDER;  Surgeon: Wyn Forster., MD;  Location: [facility];  Service: Orthopedics;  Laterality: Right;  repair supraspinatus, Debride glenohumeral joint, labrum  . Back surgery  1997    discetomy, lumbar spinal fusion  . Tubal ligation      Family History  Problem Relation Age of Onset  . Coronary artery disease      History  Substance Use Topics  . Smoking status: Never Smoker   . Smokeless tobacco: Never Used  . Alcohol Use: No    OB History   Grav Para Term Preterm Abortions TAB SAB Ect Mult Living                  Review of Systems  Constitutional: Negative.   Eyes: Negative.   Respiratory: Negative.   Gastrointestinal: Negative.   Genitourinary: Negative.   Neurological: Negative for tremors, syncope, speech difficulty, numbness and headaches.  Psychiatric/Behavioral: Negative.     Allergies  Shrimp flavor; Ace inhibitors; and Oxycodone-acetaminophen  Home Medications   Current Outpatient Rx  Name  Route  Sig  Dispense  Refill  . bisoprolol-hydrochlorothiazide [facility]) 5-6.25  MG per tablet   Oral   Take 1 tablet by mouth daily.         Marland Kitchen dicyclomine (BENTYL) 20 MG tablet   Oral   Take 1 tablet (20 mg total) by mouth every 6 (six) hours.   30 tablet   1   . levothyroxine (SYNTHROID, LEVOTHROID) 150 MCG tablet      TAKE 1 TABLET EVERY DAY   90 tablet   3   . lidocaine (LIDODERM) 5 %   Transdermal   Place 3 patches onto the skin daily. Remove & Discard patch within 12 hours or as directed by MD   90 patch   2   . metFORMIN (GLUCOPHAGE) 500 MG tablet   Oral   Take 1 tablet (500 mg total) by mouth 2 (two) times daily with a meal.   180 tablet   3   . naproxen (NAPROSYN) 500 MG tablet   Oral   Take 1 tablet (500 mg total) by mouth 2  (two) times daily.   30 tablet   0   . EXPIRED: bisoprolol-hydrochlorothiazide (ZIAC) 2.5-6.25 MG per tablet   Oral   Take 1 tablet by mouth daily.   90 tablet   3   . diclofenac sodium (VOLTAREN) 1 % GEL   Topical   Apply 1 application topically 4 (four) times daily.   100 g   0   . fluticasone (FLONASE) 50 MCG/ACT nasal spray   Nasal   Place 2 sprays into the nose daily.   16 g   6   . HYDROcodone-acetaminophen (NORCO) 7.5-325 MG per tablet   Oral   Take 1 tablet by mouth every 4 (four) hours as needed for pain.   15 tablet   0   . HYDROcodone-acetaminophen (NORCO/VICODIN) 5-325 MG per tablet      1 to 2 tabs every 4 to 6 hours as needed for pain.   20 tablet   0   . meclizine (ANTIVERT) 25 MG tablet   Oral   Take 1 tablet (25 mg total) by mouth 4 (four) times daily.   28 tablet   0   . methylPREDNISolone (MEDROL DOSEPAK) 4 MG tablet      follow package directions   21 tablet   0   . ondansetron (ZOFRAN) 4 MG tablet   Oral   Take 1 tablet (4 mg total) by mouth every 12 (twelve) hours as needed for nausea.   10 tablet   0     BP 116/53  Pulse 78  Temp(Src) 98.1 F (36.7 C) (Oral)  Resp 18  SpO2 98%  Physical Exam  Nursing note and vitals reviewed. Constitutional: She is oriented to person, place, and time. She appears well-developed and well-nourished. No distress.  HENT:  Head: Normocephalic and atraumatic.  Eyes: EOM are normal.  Neck: Normal range of motion. Neck supple.  Pulmonary/Chest: Effort normal. No respiratory distress.  Musculoskeletal:  Minor tenderness to the right low back. No spinal tenderness. Minimal tenderness to the right posterior buttock. The majority of pain feels deep she is not able to describe it. It tends to follow up a L4- L5-S1 dermatome.  Neurological: She is alert and oriented to person, place, and time. No cranial nerve deficit.  Skin: Skin is warm and dry.  Psychiatric: She has a normal mood and affect.     ED Course  Procedures (including critical care time)  Labs Reviewed - No data to display No results found.  1. Acute radicular low back pain       MDM  This milligrams every 4 hours when necessary pain #15 Medrol Dosepak Keep appointment next week for MRI and followup with your PCP who is evaluating her back pain.        Hayden Rasmussen, NP 12/26/12 1718

## 2012-12-27 NOTE — ED Provider Notes (Signed)
Medical screening examination/treatment/procedure(s) were performed by resident physician or non-physician practitioner and as supervising physician I was immediately available for consultation/collaboration.   Russie Gulledge DOUGLAS MD.   Gayathri Futrell D Norell Brisbin, MD 12/27/12 1842 

## 2012-12-28 NOTE — ED Notes (Signed)
Chart review.

## 2012-12-29 ENCOUNTER — Other Ambulatory Visit: Payer: Self-pay | Admitting: *Deleted

## 2012-12-29 ENCOUNTER — Other Ambulatory Visit: Payer: Self-pay | Admitting: Internal Medicine

## 2012-12-29 DIAGNOSIS — M545 Low back pain, unspecified: Secondary | ICD-10-CM

## 2012-12-29 NOTE — Telephone Encounter (Signed)
sent 

## 2012-12-31 ENCOUNTER — Encounter: Payer: Self-pay | Admitting: Family

## 2012-12-31 ENCOUNTER — Ambulatory Visit (INDEPENDENT_AMBULATORY_CARE_PROVIDER_SITE_OTHER): Payer: BC Managed Care – PPO | Admitting: Family

## 2012-12-31 VITALS — BP 132/92 | HR 75 | Wt 193.0 lb

## 2012-12-31 DIAGNOSIS — M5126 Other intervertebral disc displacement, lumbar region: Secondary | ICD-10-CM

## 2012-12-31 DIAGNOSIS — IMO0002 Reserved for concepts with insufficient information to code with codable children: Secondary | ICD-10-CM

## 2012-12-31 DIAGNOSIS — M5416 Radiculopathy, lumbar region: Secondary | ICD-10-CM

## 2012-12-31 DIAGNOSIS — M5136 Other intervertebral disc degeneration, lumbar region: Secondary | ICD-10-CM

## 2012-12-31 MED ORDER — HYDROCODONE-ACETAMINOPHEN 5-325 MG PO TABS: ORAL_TABLET | ORAL | Status: DC

## 2012-12-31 NOTE — Patient Instructions (Addendum)
Sciatica °Sciatica is pain, weakness, numbness, or tingling along the path of the sciatic nerve. The nerve starts in the lower back and runs down the back of each leg. The nerve controls the muscles in the lower leg and in the back of the knee, while also providing sensation to the back of the thigh, lower leg, and the sole of your foot. Sciatica is a symptom of another medical condition. For instance, nerve damage or certain conditions, such as a herniated disk or bone spur on the spine, pinch or put pressure on the sciatic nerve. This causes the pain, weakness, or other sensations normally associated with sciatica. Generally, sciatica only affects one side of the body. °CAUSES  °· Herniated or slipped disc. °· Degenerative disk disease. °· A pain disorder involving the narrow muscle in the buttocks (piriformis syndrome). °· Pelvic injury or fracture. °· Pregnancy. °· Tumor (rare). °SYMPTOMS  °Symptoms can vary from mild to very severe. The symptoms usually travel from the low back to the buttocks and down the back of the leg. Symptoms can include: °· Mild tingling or dull aches in the lower back, leg, or hip. °· Numbness in the back of the calf or sole of the foot. °· Burning sensations in the lower back, leg, or hip. °· Sharp pains in the lower back, leg, or hip. °· Leg weakness. °· Severe back pain inhibiting movement. °These symptoms may get worse with coughing, sneezing, laughing, or prolonged sitting or standing. Also, being overweight may worsen symptoms. °DIAGNOSIS  °Your caregiver will perform a physical exam to look for common symptoms of sciatica. He or she may ask you to do certain movements or activities that would trigger sciatic nerve pain. Other tests may be performed to find the cause of the sciatica. These may include: °· Blood tests. °· X-rays. °· Imaging tests, such as an MRI or CT scan. °TREATMENT  °Treatment is directed at the cause of the sciatic pain. Sometimes, treatment is not necessary  and the pain and discomfort goes away on its own. If treatment is needed, your caregiver may suggest: °· Over-the-counter medicines to relieve pain. °· Prescription medicines, such as anti-inflammatory medicine, muscle relaxants, or narcotics. °· Applying heat or ice to the painful area. °· Steroid injections to lessen pain, irritation, and inflammation around the nerve. °· Reducing activity during periods of pain. °· Exercising and stretching to strengthen your abdomen and improve flexibility of your spine. Your caregiver may suggest losing weight if the extra weight makes the back pain worse. °· Physical therapy. °· Surgery to eliminate what is pressing or pinching the nerve, such as a bone spur or part of a herniated disk. °HOME CARE INSTRUCTIONS  °· Only take over-the-counter or prescription medicines for pain or discomfort as directed by your caregiver. °· Apply ice to the affected area for 20 minutes, 3 4 times a day for the first 48 72 hours. Then try heat in the same way. °· Exercise, stretch, or perform your usual activities if these do not aggravate your pain. °· Attend physical therapy sessions as directed by your caregiver. °· Keep all follow-up appointments as directed by your caregiver. °· Do not wear high heels or shoes that do not provide proper support. °· Check your mattress to see if it is too soft. A firm mattress may lessen your pain and discomfort. °SEEK IMMEDIATE MEDICAL CARE IF:  °· You lose control of your bowel or bladder (incontinence). °· You have increasing weakness in the lower back,   pelvis, buttocks, or legs. °· You have redness or swelling of your back. °· You have a burning sensation when you urinate. °· You have pain that gets worse when you lie down or awakens you at night. °· Your pain is worse than you have experienced in the past. °· Your pain is lasting longer than 4 weeks. °· You are suddenly losing weight without reason. °MAKE SURE YOU: °· Understand these  instructions. °· Will watch your condition. °· Will get help right away if you are not doing well or get worse. °Document Released: 07/09/2001 Document Revised: 01/14/2012 Document Reviewed: 11/24/2011 °[employer]® Patient Information ©2014 [employer]. ° °

## 2012-12-31 NOTE — Progress Notes (Signed)
Subjective:    Patient ID: Alexandra [name], female    DOB: [birth year], 52 y.o.   MRN: [id number]  HPI 52 year old [place] American female, nonsmoker, patient of Dr. Lovell Sheehan is in today with low back pain. She had an MRI that showed multiple bulging disc. She is scheduled to see neurosurgery who is wanted to an epidural injection on Wednesday. However, she is in excruciating pain and is requesting a refill on pain medication. She rates the pain and 8/10, worse with movement. Pain is particularly worse with walking. Pain radiates down the right leg. She's currently on oral steroids from the urgent care.   Review of Systems  Constitutional: Negative.   Respiratory: Negative.   Cardiovascular: Negative.   Gastrointestinal: Negative.   Genitourinary: Negative.   Musculoskeletal: Positive for back pain and arthralgias.  Skin: Negative.   Allergic/Immunologic: Negative.   Neurological: Positive for numbness. Negative for seizures, weakness and light-headedness.       Numbness down the left leg  Psychiatric/Behavioral: Negative.    Past Medical History  Diagnosis Date  . GERD (gastroesophageal reflux disease)     prn med.  . Hypothyroidism   . Hypertension     new dx. - will start med. 06/28/2011  . Sleep apnea     prior to gastric bypass - no sleep study since bypass  . Rotator cuff tear, right   . Diabetes mellitus   . Hyperlipidemia   . Hiatal hernia 1998  . Hyperplastic colon polyp   . Fatty liver     History   Social History  . Marital Status: Married    Spouse Name: N/A    Number of Children: N/A  . Years of Education: N/A   Occupational History  . Not on file.   Social History Main Topics  . Smoking status: Never Smoker   . Smokeless tobacco: Never Used  . Alcohol Use: No  . Drug Use: No  . Sexually Active: Yes   Other Topics Concern  . Not on file   Social History Narrative  . No narrative on file    Past Surgical History  Procedure Laterality Date  .  Cesarean section  1991  . Cholecystectomy  1989  . Shoulder arthroscopy distal clavicle excision and open rotator cuff repair  07/26/2010    right  . Shoulder arthroscopy  01/02/2006; 05/31/2004    left 2007; right 2005  . Exploratory laparotomy  05/08/2000    right salpingo-oophorectomy  . Abdominal hysterectomy  06/25/2000    left salpingo-oophorectomy  . 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 Laminotomy / excision disk posterior cervical spine  08/10/2009    C7-T1  . Thyroid surgery  early 2000s    goiter  . Cardiac catheterization  04/01/2006  . Shoulder arthroscopy  07/02/2011    Procedure: ARTHROSCOPY SHOULDER;  Surgeon: Wyn Forster., MD;  Location: [facility];  Service: Orthopedics;  Laterality: Right;  repair supraspinatus, Debride glenohumeral joint, labrum  . Back surgery  1997    discetomy, lumbar spinal fusion  . Tubal ligation      Family History  Problem Relation Age of Onset  . Coronary artery disease      Allergies  Allergen Reactions  . Shrimp Flavor Swelling    Swelling of tongue and rash  . Ace Inhibitors Cough  . Oxycodone-Acetaminophen Rash    Current Outpatient Prescriptions on File Prior to Visit  Medication Sig Dispense Refill  . bisoprolol-hydrochlorothiazide (ZIAC) 5-6.25 MG per  tablet Take 1 tablet by mouth daily.      . diclofenac sodium (VOLTAREN) 1 % GEL Apply 1 application topically 4 (four) times daily.  100 g  0  . dicyclomine (BENTYL) 20 MG tablet Take 1 tablet (20 mg total) by mouth every 6 (six) hours.  30 tablet  1  . fluticasone (FLONASE) 50 MCG/ACT nasal spray Place 2 sprays into the nose daily.  16 g  6  . HYDROcodone-acetaminophen (NORCO) 7.5-325 MG per tablet Take 1 tablet by mouth every 4 (four) hours as needed for pain.  15 tablet  0  . levothyroxine (SYNTHROID, LEVOTHROID) 150 MCG tablet TAKE 1 TABLET EVERY DAY  90 tablet  3  . lidocaine (LIDODERM) 5 % Place 3 patches onto the skin daily. Remove & Discard patch  within 12 hours or as directed by MD  90 patch  2  . meclizine (ANTIVERT) 25 MG tablet Take 1 tablet (25 mg total) by mouth 4 (four) times daily.  28 tablet  0  . metFORMIN (GLUCOPHAGE) 500 MG tablet Take 1 tablet (500 mg total) by mouth 2 (two) times daily with a meal.  180 tablet  3  . methylPREDNISolone (MEDROL DOSEPAK) 4 MG tablet follow package directions  21 tablet  0  . naproxen (NAPROSYN) 500 MG tablet Take 1 tablet (500 mg total) by mouth 2 (two) times daily.  30 tablet  0  . ondansetron (ZOFRAN) 4 MG tablet Take 1 tablet (4 mg total) by mouth every 12 (twelve) hours as needed for nausea.  10 tablet  0  . bisoprolol-hydrochlorothiazide (ZIAC) 2.5-6.25 MG per tablet Take 1 tablet by mouth daily.  90 tablet  3  . [DISCONTINUED] DULoxetine (CYMBALTA) 20 MG capsule Take 20 mg by mouth daily. Samples per Dr Lovell Sheehan      . [DISCONTINUED] gabapentin (NEURONTIN) 300 MG capsule Take 300 mg by mouth 2 (two) times daily. Per Dr Teressa Senter       No current facility-administered medications on file prior to visit.    BP 132/92  Pulse 75  Wt 193 lb (87.544 kg)  BMI 34.72 kg/m2  SpO2 94%chart    Objective:   Physical Exam  Constitutional: She is oriented to person, place, and time. She appears well-developed and well-nourished.  Neck: Normal range of motion. Neck supple.  Cardiovascular: Normal rate, regular rhythm and normal heart sounds.   Pulmonary/Chest: Effort normal and breath sounds normal.  Abdominal: Bowel sounds are normal.  Musculoskeletal: Normal range of motion. She exhibits no edema and no tenderness.  No tenderness to palpation of the lumbar spine. Pain in the supine position. Pain with straight leg raise maneuver of the right leg. Pedal pulses 2 out of 2  Neurological: She is alert and oriented to person, place, and time. She has normal reflexes. No cranial nerve deficit.  Skin: Skin is warm and dry.  Psychiatric: She has a normal mood and affect.          Assessment &  Plan:  Assessment:  1. Lumbar radiculopathy 2. Bulging disc 3. Low back pain  Plan: Refill prescription for hydrocodone 11/29/2023 one tablet every 4-6 hours as needed for pain. Of work today. Return tomorrow. Patient call the office if symptoms worsen or persist. Recheck a schedule, and as needed. Followup with neurosurgery as scheduled.

## 2013-01-05 ENCOUNTER — Ambulatory Visit
Admission: RE | Admit: 2013-01-05 | Discharge: 2013-01-05 | Disposition: A | Discharge status: Home / Self Care | Payer: BC Managed Care – PPO | Source: Ambulatory Visit | Attending: Internal Medicine | Admitting: Internal Medicine

## 2013-01-05 DIAGNOSIS — M545 Low back pain, unspecified: Secondary | ICD-10-CM

## 2013-01-05 MED ORDER — METHYLPREDNISOLONE ACETATE 40 MG/ML INJ SUSP (RADIOLOG
120.0000 mg | Freq: Once | INTRAMUSCULAR | Status: AC
  Administered 2013-01-05: 120 mg via EPIDURAL

## 2013-01-05 MED ORDER — IOHEXOL 180 MG/ML  SOLN
1.0000 mL | Freq: Once | INTRAMUSCULAR | Status: AC | PRN
  Administered 2013-01-05: 1 mL via EPIDURAL

## 2013-01-05 NOTE — Progress Notes (Signed)
Pt still has difficulty raising left leg, right leg seems to be working okay. Will assess again shortly.

## 2013-01-05 NOTE — Progress Notes (Signed)
Up to chair with max assist. Pt still can not bear wt.  Dr. Alfredo Batty made aware.

## 2013-01-05 NOTE — Progress Notes (Addendum)
Pt received in nursing area on stretcher, pt's lower legs are numb and tingly. Will keep pt on stretcher until she is able to walk. Dr. Alfredo Batty in to visit.

## 2013-01-16 ENCOUNTER — Emergency Department (HOSPITAL_COMMUNITY): Payer: BC Managed Care – PPO

## 2013-01-16 ENCOUNTER — Observation Stay (HOSPITAL_COMMUNITY)
Admission: EM | Admit: 2013-01-16 | Discharge: 2013-01-19 | Disposition: A | Discharge status: Home / Self Care | Payer: BC Managed Care – PPO | Attending: Internal Medicine | Admitting: Internal Medicine

## 2013-01-16 ENCOUNTER — Encounter (HOSPITAL_COMMUNITY): Payer: Self-pay | Admitting: Physical Medicine and Rehabilitation

## 2013-01-16 DIAGNOSIS — R4789 Other speech disturbances: Principal | ICD-10-CM | POA: Insufficient documentation

## 2013-01-16 DIAGNOSIS — E039 Hypothyroidism, unspecified: Secondary | ICD-10-CM | POA: Diagnosis present

## 2013-01-16 DIAGNOSIS — M542 Cervicalgia: Secondary | ICD-10-CM

## 2013-01-16 DIAGNOSIS — IMO0001 Reserved for inherently not codable concepts without codable children: Secondary | ICD-10-CM

## 2013-01-16 DIAGNOSIS — R079 Chest pain, unspecified: Secondary | ICD-10-CM | POA: Diagnosis present

## 2013-01-16 DIAGNOSIS — E119 Type 2 diabetes mellitus without complications: Secondary | ICD-10-CM | POA: Diagnosis present

## 2013-01-16 DIAGNOSIS — R29810 Facial weakness: Secondary | ICD-10-CM | POA: Insufficient documentation

## 2013-01-16 DIAGNOSIS — K2990 Gastroduodenitis, unspecified, without bleeding: Secondary | ICD-10-CM

## 2013-01-16 DIAGNOSIS — Z9884 Bariatric surgery status: Secondary | ICD-10-CM

## 2013-01-16 DIAGNOSIS — L732 Hidradenitis suppurativa: Secondary | ICD-10-CM

## 2013-01-16 DIAGNOSIS — R4781 Slurred speech: Secondary | ICD-10-CM | POA: Diagnosis present

## 2013-01-16 DIAGNOSIS — G459 Transient cerebral ischemic attack, unspecified: Secondary | ICD-10-CM | POA: Diagnosis present

## 2013-01-16 DIAGNOSIS — K2970 Gastritis, unspecified, without bleeding: Secondary | ICD-10-CM

## 2013-01-16 DIAGNOSIS — F489 Nonpsychotic mental disorder, unspecified: Secondary | ICD-10-CM

## 2013-01-16 DIAGNOSIS — I10 Essential (primary) hypertension: Secondary | ICD-10-CM | POA: Insufficient documentation

## 2013-01-16 DIAGNOSIS — G8190 Hemiplegia, unspecified affecting unspecified side: Secondary | ICD-10-CM | POA: Diagnosis present

## 2013-01-16 DIAGNOSIS — E785 Hyperlipidemia, unspecified: Secondary | ICD-10-CM | POA: Insufficient documentation

## 2013-01-16 DIAGNOSIS — D6489 Other specified anemias: Secondary | ICD-10-CM

## 2013-01-16 HISTORY — DX: Thyrotoxicosis, unspecified without thyrotoxic crisis or storm: E05.90

## 2013-01-16 LAB — CBC
MCV: 88.9 fL (ref 78.0–100.0)
Platelets: 369 10*3/uL (ref 150–400)
RBC: 4.32 MIL/uL (ref 3.87–5.11)
WBC: 10.2 10*3/uL (ref 4.0–10.5)

## 2013-01-16 LAB — PROTIME-INR
INR: 0.87 (ref 0.00–1.49)
Prothrombin Time: 11.8 seconds (ref 11.6–15.2)

## 2013-01-16 LAB — COMPREHENSIVE METABOLIC PANEL
ALT: 19 U/L (ref 0–35)
AST: 20 U/L (ref 0–37)
Alkaline Phosphatase: 89 U/L (ref 39–117)
CO2: 27 mEq/L (ref 19–32)
Chloride: 98 mEq/L (ref 96–112)
GFR calc Af Amer: >90 mL/min (ref >90)
GFR calc non Af Amer: >90 mL/min (ref >90)
Glucose, Bld: 246 mg/dL — ABNORMAL HIGH (ref 70–99)
Potassium: 3.5 mEq/L (ref 3.5–5.1)
Sodium: 137 mEq/L (ref 135–145)

## 2013-01-16 LAB — RAPID URINE DRUG SCREEN, HOSP PERFORMED
Barbiturates: NOT DETECTED
Benzodiazepines: NOT DETECTED
Tetrahydrocannabinol: NOT DETECTED

## 2013-01-16 LAB — URINALYSIS, ROUTINE W REFLEX MICROSCOPIC
Bilirubin Urine: NEGATIVE
Ketones, ur: NEGATIVE mg/dL
Leukocytes, UA: NEGATIVE
Nitrite: NEGATIVE
Specific Gravity, Urine: 1.013 (ref 1.005–1.030)
Urobilinogen, UA: 1 mg/dL (ref 0.0–1.0)
pH: 5.5 (ref 5.0–8.0)

## 2013-01-16 LAB — DIFFERENTIAL
Basophils Absolute: 0 10*3/uL (ref 0.0–0.1)
Lymphocytes Relative: 30 % (ref 12–46)
Lymphs Abs: 3.1 10*3/uL (ref 0.7–4.0)
Neutro Abs: 6.2 10*3/uL (ref 1.7–7.7)
Neutrophils Relative %: 61 % (ref 43–77)

## 2013-01-16 LAB — POCT I-STAT, CHEM 8
Calcium, Ion: 1.09 mmol/L — ABNORMAL LOW (ref 1.12–1.23)
Chloride: 98 mEq/L (ref 96–112)
Glucose, Bld: 246 mg/dL — ABNORMAL HIGH (ref 70–99)
HCT: 41 % (ref 36.0–46.0)
Hemoglobin: 13.9 g/dL (ref 12.0–15.0)

## 2013-01-16 LAB — POCT I-STAT TROPONIN I: Troponin i, poc: 0.04 ng/mL (ref 0.00–0.08)

## 2013-01-16 LAB — GLUCOSE, CAPILLARY: Glucose-Capillary: 206 mg/dL — ABNORMAL HIGH (ref 70–99)

## 2013-01-16 LAB — APTT: aPTT: 27 seconds (ref 24–37)

## 2013-01-16 MED ORDER — FLUTICASONE PROPIONATE 50 MCG/ACT NA SUSP
2.0000 | Freq: Every day | NASAL | Status: DC
  Administered 2013-01-17 – 2013-01-19 (×3): 2 via NASAL
  Filled 2013-01-16: qty 16

## 2013-01-16 MED ORDER — ONDANSETRON HCL 4 MG/2ML IJ SOLN
4.0000 mg | Freq: Four times a day (QID) | INTRAMUSCULAR | Status: DC | PRN
  Administered 2013-01-16 – 2013-01-17 (×3): 4 mg via INTRAVENOUS
  Filled 2013-01-16 (×3): qty 2

## 2013-01-16 MED ORDER — DICLOFENAC SODIUM 1 % TD GEL
2.0000 g | Freq: Four times a day (QID) | TRANSDERMAL | Status: DC
  Administered 2013-01-17: 2 g via TOPICAL
  Filled 2013-01-16: qty 100

## 2013-01-16 MED ORDER — MECLIZINE HCL 25 MG PO TABS
25.0000 mg | ORAL_TABLET | Freq: Four times a day (QID) | ORAL | Status: DC
  Administered 2013-01-17 – 2013-01-19 (×8): 25 mg via ORAL
  Filled 2013-01-16 (×13): qty 1

## 2013-01-16 MED ORDER — INSULIN ASPART 100 UNIT/ML ~~LOC~~ SOLN
0.0000 [IU] | Freq: Three times a day (TID) | SUBCUTANEOUS | Status: DC
Start: 2013-01-17 — End: 2013-01-19
  Administered 2013-01-17: 4 [IU] via SUBCUTANEOUS
  Administered 2013-01-17: 3 [IU] via SUBCUTANEOUS
  Administered 2013-01-17: 4 [IU] via SUBCUTANEOUS
  Administered 2013-01-18: 7 [IU] via SUBCUTANEOUS
  Administered 2013-01-19 (×2): 4 [IU] via SUBCUTANEOUS

## 2013-01-16 MED ORDER — INSULIN ASPART 100 UNIT/ML ~~LOC~~ SOLN: 0.0000 [IU] | Freq: Every day | SUBCUTANEOUS | Status: DC

## 2013-01-16 MED ORDER — INSULIN ASPART 100 UNIT/ML ~~LOC~~ SOLN: 0.0000 [IU] | SUBCUTANEOUS | Status: DC

## 2013-01-16 MED ORDER — HYDROCODONE-ACETAMINOPHEN 5-325 MG PO TABS
1.0000 | ORAL_TABLET | Freq: Four times a day (QID) | ORAL | Status: DC | PRN
  Administered 2013-01-19 (×2): 2 via ORAL
  Filled 2013-01-16 (×2): qty 2

## 2013-01-16 MED ORDER — ASPIRIN 325 MG PO TABS: 325.0000 mg | ORAL_TABLET | Freq: Once | ORAL | Status: DC

## 2013-01-16 MED ORDER — NITROGLYCERIN 0.4 MG SL SUBL
0.4000 mg | SUBLINGUAL_TABLET | SUBLINGUAL | Status: DC | PRN
  Administered 2013-01-16 (×2): 0.4 mg via SUBLINGUAL
  Filled 2013-01-16: qty 25

## 2013-01-16 MED ORDER — LIDOCAINE 5 % EX PTCH
3.0000 | MEDICATED_PATCH | CUTANEOUS | Status: DC
  Administered 2013-01-16 – 2013-01-18 (×3): 3 via TRANSDERMAL
  Filled 2013-01-16 (×4): qty 3

## 2013-01-16 MED ORDER — LEVOTHYROXINE SODIUM 150 MCG PO TABS
150.0000 ug | ORAL_TABLET | Freq: Every day | ORAL | Status: DC
  Administered 2013-01-17 – 2013-01-19 (×3): 150 ug via ORAL
  Filled 2013-01-16 (×4): qty 1

## 2013-01-16 MED ORDER — ASPIRIN 325 MG PO TABS
325.0000 mg | ORAL_TABLET | Freq: Every day | ORAL | Status: DC
  Administered 2013-01-17 – 2013-01-19 (×3): 325 mg via ORAL
  Filled 2013-01-16 (×3): qty 1

## 2013-01-16 MED ORDER — ASPIRIN 325 MG PO TABS
325.0000 mg | ORAL_TABLET | Freq: Once | ORAL | Status: AC
  Administered 2013-01-16: 325 mg via ORAL
  Filled 2013-01-16: qty 1

## 2013-01-16 MED ORDER — ONDANSETRON HCL 4 MG/2ML IJ SOLN
4.0000 mg | Freq: Once | INTRAMUSCULAR | Status: AC
  Administered 2013-01-16: 4 mg via INTRAVENOUS
  Filled 2013-01-16: qty 2

## 2013-01-16 MED ORDER — BISOPROLOL-HYDROCHLOROTHIAZIDE 5-6.25 MG PO TABS
1.0000 | ORAL_TABLET | Freq: Every day | ORAL | Status: DC
  Administered 2013-01-17: 1 via ORAL
  Filled 2013-01-16 (×2): qty 1

## 2013-01-16 NOTE — ED Provider Notes (Signed)
History     CSN: [id number]  Arrival date & time 01/16/13  1050   First MD Initiated Contact with Patient 01/16/13 1056      Chief Complaint  Patient presents with  . Code Stroke    (Consider location/radiation/quality/duration/timing/severity/associated sxs/prior treatment) The history is provided by the patient and the EMS personnel. The history is limited by the condition of the patient.  Alexandra [name] is a 52 y.o. female history of hypertension, GERD, presenting with possible stroke.  She works at a call center and at 10 AM this morning, she was found to be acutely altered and unable to speak.  She also was noted to have right-sided weakness and facial droop by EMS.  Code stroke was activated by EMS.   Level V caveat- stroke, condition of patient    Past Medical History  Diagnosis Date  . GERD (gastroesophageal reflux disease)     prn med.  . Hypothyroidism   . Hypertension     new dx. - will start med. 06/28/2011  . Sleep apnea     prior to gastric bypass - no sleep study since bypass  . Rotator cuff tear, right   . Diabetes mellitus   . Hyperlipidemia   . Hiatal hernia 1998  . Hyperplastic colon polyp   . Fatty liver     Past Surgical History  Procedure Laterality Date  . Cesarean section  1991  . Cholecystectomy  1989  . Shoulder arthroscopy distal clavicle excision and open rotator cuff repair  07/26/2010    right  . Shoulder arthroscopy  01/02/2006; 05/31/2004    left 2007; right 2005  . Exploratory laparotomy  05/08/2000    right salpingo-oophorectomy  . Abdominal hysterectomy  06/25/2000    left salpingo-oophorectomy  . 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 Laminotomy / excision disk posterior cervical spine  08/10/2009    C7-T1  . Thyroid surgery  early 2000s    goiter  . Cardiac catheterization  04/01/2006  . Shoulder arthroscopy  07/02/2011    Procedure: ARTHROSCOPY SHOULDER;  Surgeon: Wyn Forster., MD;  Location: [facility];   Service: Orthopedics;  Laterality: Right;  repair supraspinatus, Debride glenohumeral joint, labrum  . Back surgery  1997    discetomy, lumbar spinal fusion  . Tubal ligation      Family History  Problem Relation Age of Onset  . Coronary artery disease      History  Substance Use Topics  . Smoking status: Never Smoker   . Smokeless tobacco: Never Used  . Alcohol Use: No    OB History   Grav Para Term Preterm Abortions TAB SAB Ect Mult Living                  Review of Systems  Neurological: Positive for speech difficulty and weakness.  All other systems reviewed and are negative.    Allergies  Shrimp flavor; Ace inhibitors; and Oxycodone-acetaminophen  Home Medications   Current Outpatient Rx  Name  Route  Sig  Dispense  Refill  . bisoprolol-hydrochlorothiazide (ZIAC) 5-6.25 MG per tablet   Oral   Take 1 tablet by mouth daily.         . diclofenac sodium (VOLTAREN) 1 % GEL   Topical   Apply 1 application topically 4 (four) times daily.   100 g   0   . fluticasone (FLONASE) 50 MCG/ACT nasal spray   Nasal   Place 2 sprays into the nose  daily.   16 g   6   . HYDROcodone-acetaminophen (NORCO/VICODIN) 5-325 MG per tablet   Oral   Take 1-2 tablets by mouth every 6 (six) hours as needed for pain.         Marland Kitchen levothyroxine (SYNTHROID, LEVOTHROID) 150 MCG tablet      TAKE 1 TABLET EVERY DAY   90 tablet   3   . lidocaine (LIDODERM) 5 %   Transdermal   Place 3 patches onto the skin daily. Remove & Discard patch within 12 hours or as directed by MD   90 patch   2   . meclizine (ANTIVERT) 25 MG tablet   Oral   Take 1 tablet (25 mg total) by mouth 4 (four) times daily.   28 tablet   0   . metFORMIN (GLUCOPHAGE) 500 MG tablet   Oral   Take 1 tablet (500 mg total) by mouth 2 (two) times daily with a meal.   180 tablet   3     BP 127/74  Pulse 84  Resp 14  SpO2 95%  Physical Exam  Nursing note and vitals reviewed. Constitutional:  Slurred  speech, obvious R facial droop.   HENT:  Head: Normocephalic.  Mouth/Throat: Oropharynx is clear and moist.  Eyes: Conjunctivae are normal. Pupils are equal, round, and reactive to light.  Neck: Normal range of motion. Neck supple.  Cardiovascular: Normal rate, regular rhythm and normal heart sounds.   Pulmonary/Chest: Effort normal and breath sounds normal. No respiratory distress. She has no wheezes. She has no rales.  Abdominal: Soft. Bowel sounds are normal. She exhibits no distension. There is no tenderness. There is no rebound and no guarding.  Musculoskeletal: She exhibits no edema and no tenderness.  Neurological:  Alert, slurred speech. R facial droop. Unable to lift up R arm or leg. 4/5 strength L arm and leg.   Skin: Skin is warm and dry.  Psychiatric:  Unable     ED Course  Procedures (including critical care time)  Labs Reviewed  COMPREHENSIVE METABOLIC PANEL - Abnormal; Notable for the following:    Glucose, Bld 246 (*)    All other components within normal limits  GLUCOSE, CAPILLARY - Abnormal; Notable for the following:    Glucose-Capillary 206 (*)    All other components within normal limits  POCT I-STAT, CHEM 8 - Abnormal; Notable for the following:    Glucose, Bld 246 (*)    Calcium, Ion 1.09 (*)    All other components within normal limits  ETHANOL  PROTIME-INR  APTT  CBC  DIFFERENTIAL  TROPONIN I  URINE RAPID DRUG SCREEN (HOSP PERFORMED)  URINALYSIS, ROUTINE W REFLEX MICROSCOPIC  POCT I-STAT TROPONIN I   Ct Head Wo Contrast  01/16/2013   *RADIOLOGY REPORT*  Clinical Data: Code stroke  CT HEAD WITHOUT CONTRAST  Technique:  Contiguous axial images were obtained from the base of the skull through the vertex without contrast.  Comparison: 07/03/2007  Findings: The brain has a normal appearance without evidence for hemorrhage, infarction, hydrocephalus, or mass lesion.  There is no extra axial fluid collection.  The skull and paranasal sinuses are normal.   IMPRESSION: Negative exam.   Original Report Authenticated By: Signa Kell, M.D.   Mr Brain Wo Contrast  01/16/2013   *RADIOLOGY REPORT*  Clinical Data: Heaviness in right arm.  Aphasia.  Stroke risk factors include obesity, hypertension, diabetes mellitus, and hyperlipidemia.  MRI HEAD WITHOUT CONTRAST  Technique:  Multiplanar, multiecho pulse sequences  of the brain and surrounding structures were obtained according to standard protocol without intravenous contrast.  Comparison: CT head earlier in the day.  Findings: No acute stroke or hemorrhage.  No mass lesion or hydrocephalus.  No extra-axial fluid.  There may be a tiny remote lacunar infarct left posterolateral thalamus.  Normal cerebral volume.  No significant white matter disease.  No foci of chronic hemorrhage.  The calvarium is intact.  Flow voids are maintained in the major intracranial vessels.  Pituitary and cerebellar tonsils unremarkable.  No orbital findings.  No acute sinus or mastoid disease.  IMPRESSION: Unremarkable noncontrast MRI of the head.  Specifically no evidence for restricted diffusion to suggest an acute infarct.   Original Report Authenticated By: Davonna Belling, M.D.   Dg Chest Portable 1 View  01/16/2013   *RADIOLOGY REPORT*  Clinical Data: Chest pressure.  Hypertension.  PORTABLE CHEST - 1 VIEW  Comparison: 03/03/2009  Findings: The heart, mediastinum and hila are unremarkable.  The lungs are clear allowing for the semi-erect positioning and significant overlying soft tissues.  No pleural effusion or pneumothorax.  The bony thorax is intact.  IMPRESSION: No active disease of the chest.   Original Report Authenticated By: Amie Portland, M.D.     No diagnosis found.   Date: 01/16/2013  Rate: 83  Rhythm: normal sinus rhythm  QRS Axis: normal  Intervals: normal  ST/T Wave abnormalities: inferior TWI  Conduction Disutrbances:none  Narrative Interpretation:   Old EKG Reviewed: none available    MDM  Shakeya T Loeffler  is a 52 y.o. female here with code stroke. Neuro at bedside. CT ordered.   12:46 PM Patient's speech still slurred. MRI and CT showed no stroke. Neuro cleared her. She complained of chest pain. TWI inferiorly is new since prior but trop neg x 1. Still has chest pressure. Will admit to tele under observation. Dr. Lucretia Roers accepted the patient.        Richardean Canal, MD 01/16/13 1247

## 2013-01-16 NOTE — Consult Note (Signed)
Reason for Consult: Chest pain  Referring Physician: Dr. Dora Sims Alexandra [name] is an 52 y.o. female.   HPI: 52 y/o female currently seen in consultation with Dr. Joseph Art for evaluation of chest pain. She is currently admitted to medicine service for evaluation of CVA vz TIA. She has a PMH of HTN and DM and  complains if having intermittent episodes of exertional chest pain for the last 2 year that has gotten worse over the last two days prior to presentation.  Her chest pain is described as a tightness,  4/10 in severity, typically occurs with minimal activities like walking around the house or sweeping and is associated with diaphoresis, shortness of breath and nausea, but she denies vomiting, PND, orthopnea, palpitation or syncope. Her chest pain typically last for about 15-20 minutes before spontaneous relief.  In the last 2 days, she feels like the chest pain has become somewhat persistent but less severe 2/10.  Currently, she has had 2 sets of cardiac markers that are normal.  EKG performed today showed sinus rhythm with heart rate of 83 bpm; there is non-specific T-wave abnormalities in inferior lead (new when compared to prior EKG from 02/2012). She reports that she was told not to take Aspirin products in 04/2007 after her gastric bypass surgery. However, she did take Aspirin today and she did not have any adverse effect.   Past Medical History  Diagnosis Date  . GERD (gastroesophageal reflux disease)     prn med.  . Hypothyroidism   . Hypertension     new dx. - will start med. 06/28/2011  . Sleep apnea     prior to gastric bypass - no sleep study since bypass  . Rotator cuff tear, right   . Diabetes mellitus   . Hyperlipidemia   . Hiatal hernia 1998  . Hyperplastic colon polyp   . Fatty liver   . Hyperthyroidism     Past Surgical History  Procedure Laterality Date  . Cesarean section  1991  . Cholecystectomy  1989  . Shoulder arthroscopy distal clavicle excision and open  rotator cuff repair  07/26/2010    right  . Shoulder arthroscopy  01/02/2006; 05/31/2004    left 2007; right 2005  . Exploratory laparotomy  05/08/2000    right salpingo-oophorectomy  . Abdominal hysterectomy  06/25/2000    left salpingo-oophorectomy  . 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 Laminotomy / excision disk posterior cervical spine  08/10/2009    C7-T1  . Thyroid surgery  early 2000s    goiter  . Cardiac catheterization  04/01/2006  . Shoulder arthroscopy  07/02/2011    Procedure: ARTHROSCOPY SHOULDER;  Surgeon: Wyn Forster., MD;  Location: [facility];  Service: Orthopedics;  Laterality: Right;  repair supraspinatus, Debride glenohumeral joint, labrum  . Back surgery  1997    discetomy, lumbar spinal fusion  . Tubal ligation      Family History  Problem Relation Age of Onset  . Coronary artery disease      Social History:  reports that she has never smoked. She has never used smokeless tobacco. She reports that she does not drink alcohol or use illicit drugs.  Allergies:  Allergies  Allergen Reactions  . Shrimp Flavor Swelling    Swelling of tongue and rash  . Ace Inhibitors Cough  . Oxycodone-Acetaminophen Rash    Medications:   Bisoprolol/HCTZ 5/6.25 q day Levothyroxine 150 microgram q day  Results for orders placed during the hospital  encounter of 01/16/13 (from the past 48 hour(s))  ETHANOL     Status: None   Collection Time    01/16/13 10:53 AM      Result Value Range   Alcohol, Ethyl (B) <11  0 - 11 mg/dL   Comment:            LOWEST DETECTABLE LIMIT FOR     SERUM ALCOHOL IS 11 mg/dL     FOR MEDICAL PURPOSES ONLY  PROTIME-INR     Status: None   Collection Time    01/16/13 10:53 AM      Result Value Range   Prothrombin Time 11.8  11.6 - 15.2 seconds   INR 0.87  0.00 - 1.49  APTT     Status: None   Collection Time    01/16/13 10:53 AM      Result Value Range   aPTT 27  24 - 37 seconds  CBC     Status: None   Collection  Time    01/16/13 10:53 AM      Result Value Range   WBC 10.2  4.0 - 10.5 K/uL   RBC 4.32  3.87 - 5.11 MIL/uL   Hemoglobin 13.0  12.0 - 15.0 g/dL   HCT 21.3  08.6 - 57.8 %   MCV 88.9  78.0 - 100.0 fL   MCH 30.1  26.0 - 34.0 pg   MCHC 33.9  30.0 - 36.0 g/dL   RDW 46.9  62.9 - 52.8 %   Platelets 369  150 - 400 K/uL  DIFFERENTIAL     Status: None   Collection Time    01/16/13 10:53 AM      Result Value Range   Neutrophils Relative % 61  43 - 77 %   Neutro Abs 6.2  1.7 - 7.7 K/uL   Lymphocytes Relative 30  12 - 46 %   Lymphs Abs 3.1  0.7 - 4.0 K/uL   Monocytes Relative 7  3 - 12 %   Monocytes Absolute 0.7  0.1 - 1.0 K/uL   Eosinophils Relative 2  0 - 5 %   Eosinophils Absolute 0.2  0.0 - 0.7 K/uL   Basophils Relative 0  0 - 1 %   Basophils Absolute 0.0  0.0 - 0.1 K/uL  COMPREHENSIVE METABOLIC PANEL     Status: Abnormal   Collection Time    01/16/13 10:53 AM      Result Value Range   Sodium 137  135 - 145 mEq/L   Potassium 3.5  3.5 - 5.1 mEq/L   Chloride 98  96 - 112 mEq/L   CO2 27  19 - 32 mEq/L   Glucose, Bld 246 (*) 70 - 99 mg/dL   BUN 8  6 - 23 mg/dL   Creatinine, Ser 4.13  0.50 - 1.10 mg/dL   Calcium 8.9  8.4 - 24.4 mg/dL   Total Protein 7.4  6.0 - 8.3 g/dL   Albumin 3.6  3.5 - 5.2 g/dL   AST 20  0 - 37 U/L   ALT 19  0 - 35 U/L   Alkaline Phosphatase 89  39 - 117 U/L   Total Bilirubin 0.3  0.3 - 1.2 mg/dL   GFR calc non Af Amer >90  >90 mL/min   GFR calc Af Amer >90  >90 mL/min   Comment:            The eGFR has been calculated     using the CKD  EPI equation.     This calculation has not been     validated in all clinical     situations.     eGFR's persistently     <90 mL/min signify     possible Chronic Kidney Disease.  TROPONIN I     Status: None   Collection Time    01/16/13 10:53 AM      Result Value Range   Troponin I <0.30  <0.30 ng/mL   Comment:            Due to the release kinetics of cTnI,     a negative result within the first hours     of the  onset of symptoms does not rule out     myocardial infarction with certainty.     If myocardial infarction is still suspected,     repeat the test at appropriate intervals.  POCT I-STAT, CHEM 8     Status: Abnormal   Collection Time    01/16/13 11:04 AM      Result Value Range   Sodium 140  135 - 145 mEq/L   Potassium 3.5  3.5 - 5.1 mEq/L   Chloride 98  96 - 112 mEq/L   BUN 7  6 - 23 mg/dL   Creatinine, Ser 1.61  0.50 - 1.10 mg/dL   Glucose, Bld 096 (*) 70 - 99 mg/dL   Calcium, Ion 0.45 (*) 1.12 - 1.23 mmol/L   TCO2 29  0 - 100 mmol/L   Hemoglobin 13.9  12.0 - 15.0 g/dL   HCT 40.9  81.1 - 91.4 %  POCT I-STAT TROPONIN I     Status: None   Collection Time    01/16/13 11:13 AM      Result Value Range   Troponin i, poc 0.04  0.00 - 0.08 ng/mL   Comment 3            Comment: Due to the release kinetics of cTnI,     a negative result within the first hours     of the onset of symptoms does not rule out     myocardial infarction with certainty.     If myocardial infarction is still suspected,     repeat the test at appropriate intervals.  GLUCOSE, CAPILLARY     Status: Abnormal   Collection Time    01/16/13 11:17 AM      Result Value Range   Glucose-Capillary 206 (*) 70 - 99 mg/dL  URINE RAPID DRUG SCREEN (HOSP PERFORMED)     Status: None   Collection Time    01/16/13  2:54 PM      Result Value Range   Opiates NONE DETECTED  NONE DETECTED   Cocaine NONE DETECTED  NONE DETECTED   Benzodiazepines NONE DETECTED  NONE DETECTED   Amphetamines NONE DETECTED  NONE DETECTED   Tetrahydrocannabinol NONE DETECTED  NONE DETECTED   Barbiturates NONE DETECTED  NONE DETECTED   Comment:            DRUG SCREEN FOR MEDICAL PURPOSES     ONLY.  IF CONFIRMATION IS NEEDED     FOR ANY PURPOSE, NOTIFY LAB     WITHIN 5 DAYS.                LOWEST DETECTABLE LIMITS     FOR URINE DRUG SCREEN     Drug Class       Cutoff (ng/mL)     Amphetamine  1000     Barbiturate      200      Benzodiazepine   200     Tricyclics       300     Opiates          300     Cocaine          300     THC              50  URINALYSIS, ROUTINE W REFLEX MICROSCOPIC     Status: None   Collection Time    01/16/13  2:54 PM      Result Value Range   Color, Urine YELLOW  YELLOW   APPearance CLEAR  CLEAR   Specific Gravity, Urine 1.013  1.005 - 1.030   pH 5.5  5.0 - 8.0   Glucose, UA NEGATIVE  NEGATIVE mg/dL   Hgb urine dipstick NEGATIVE  NEGATIVE   Bilirubin Urine NEGATIVE  NEGATIVE   Ketones, ur NEGATIVE  NEGATIVE mg/dL   Protein, ur NEGATIVE  NEGATIVE mg/dL   Urobilinogen, UA 1.0  0.0 - 1.0 mg/dL   Nitrite NEGATIVE  NEGATIVE   Leukocytes, UA NEGATIVE  NEGATIVE   Comment: MICROSCOPIC NOT DONE ON URINES WITH NEGATIVE PROTEIN, BLOOD, LEUKOCYTES, NITRITE, OR GLUCOSE <1000 mg/dL.  GLUCOSE, CAPILLARY     Status: Abnormal   Collection Time    01/16/13  4:40 PM      Result Value Range   Glucose-Capillary 252 (*) 70 - 99 mg/dL   Comment 1 Notify RN      Ct Head Wo Contrast  01/16/2013   *RADIOLOGY REPORT*  Clinical Data: Code stroke  CT HEAD WITHOUT CONTRAST  Technique:  Contiguous axial images were obtained from the base of the skull through the vertex without contrast.  Comparison: 07/03/2007  Findings: The brain has a normal appearance without evidence for hemorrhage, infarction, hydrocephalus, or mass lesion.  There is no extra axial fluid collection.  The skull and paranasal sinuses are normal.  IMPRESSION: Negative exam.   Original Report Authenticated By: Signa Kell, M.D.   Mr Brain Wo Contrast  01/16/2013   *RADIOLOGY REPORT*  Clinical Data: Heaviness in right arm.  Aphasia.  Stroke risk factors include obesity, hypertension, diabetes mellitus, and hyperlipidemia.  MRI HEAD WITHOUT CONTRAST  Technique:  Multiplanar, multiecho pulse sequences of the brain and surrounding structures were obtained according to standard protocol without intravenous contrast.  Comparison: CT head earlier in  the day.  Findings: No acute stroke or hemorrhage.  No mass lesion or hydrocephalus.  No extra-axial fluid.  There may be a tiny remote lacunar infarct left posterolateral thalamus.  Normal cerebral volume.  No significant white matter disease.  No foci of chronic hemorrhage.  The calvarium is intact.  Flow voids are maintained in the major intracranial vessels.  Pituitary and cerebellar tonsils unremarkable.  No orbital findings.  No acute sinus or mastoid disease.  IMPRESSION: Unremarkable noncontrast MRI of the head.  Specifically no evidence for restricted diffusion to suggest an acute infarct.   Original Report Authenticated By: Davonna Belling, M.D.   Dg Chest Portable 1 View  01/16/2013   *RADIOLOGY REPORT*  Clinical Data: Chest pressure.  Hypertension.  PORTABLE CHEST - 1 VIEW  Comparison: 03/03/2009  Findings: The heart, mediastinum and hila are unremarkable.  The lungs are clear allowing for the semi-erect positioning and significant overlying soft tissues.  No pleural effusion or pneumothorax.  The bony thorax is intact.  IMPRESSION: No active disease of the chest.   Original Report Authenticated By: Amie Portland, M.D.    Review of Systems  Constitutional: Negative for fever, chills, weight loss, malaise/fatigue and diaphoresis.  HENT: Negative for hearing loss, ear pain, nosebleeds, congestion, sore throat, neck pain, tinnitus and ear discharge.   Eyes: Negative for blurred vision, double vision, photophobia, pain, discharge and redness.  Respiratory: Positive for shortness of breath. Negative for cough, hemoptysis, sputum production, wheezing and stridor.   Cardiovascular: Positive for chest pain. Negative for palpitations, orthopnea, claudication, leg swelling and PND.  Gastrointestinal: Negative for heartburn, nausea, vomiting, abdominal pain, diarrhea and constipation.  Genitourinary: Negative for dysuria and frequency.  Musculoskeletal: Negative for myalgias and back pain.  Skin: Negative  for itching and rash.  Neurological: Positive for speech change. Negative for dizziness, tingling, tremors, focal weakness, seizures, weakness and headaches.  Psychiatric/Behavioral: Negative for hallucinations.   Blood pressure 113/71, pulse 83, temperature 98.5 F (36.9 C), temperature source Oral, resp. rate 20, height 5\' 2"  (1.575 m), weight 86.8 kg (191 lb 5.8 oz), SpO2 93.00%. Physical Exam  Constitutional: She is oriented to person, place, and time. She appears well-developed and well-nourished. No distress.  HENT:  Head: Normocephalic.  Eyes: EOM are normal. Pupils are equal, round, and reactive to light. Right eye exhibits no discharge. Left eye exhibits no discharge. No scleral icterus.  Neck: Normal range of motion. Neck supple. No JVD present.  Cardiovascular: Normal rate, regular rhythm and normal heart sounds.  Exam reveals no gallop and no friction rub.   No murmur heard. Respiratory: Effort normal and breath sounds normal. No stridor. No respiratory distress. She has no wheezes. She has no rales. She exhibits no tenderness.  GI: Soft. Bowel sounds are normal. She exhibits no distension. There is no tenderness. There is no rebound and no guarding.  Musculoskeletal: Normal range of motion. She exhibits no edema and no tenderness.  Neurological: She is alert and oriented to person, place, and time. No cranial nerve deficit. Coordination normal.  Skin: No rash noted. She is not diaphoretic.  Psychiatric: She has a normal mood and affect.    Assessment/Plan:  1.  Chest pain: She has multiple risk factors for CAD including a CAD-equivalent (DM).  We recommend ruling her out for MI with serial cardiac markers, initiating high intensity statin, nitrates and ASA 81 mg q day if this is ok with patient and her primary physician.  If she agrees to taking Aspirin and/or dual anti-platelet medication, we could consider performing a cardiac catheterization on Monday especially since she is  still complaining of intermittent chest pain.  Otherwise, we could perform a pharmacologic nuclear stress test since she is not able to ambulate on a treadmill at this time.  2.  HTN: Currently well-controlled  3. Hyperlipidemia: Patient will be started on a statin. Fasting lipids pending.   Byford Schools E 01/16/2013, 9:40 PM

## 2013-01-16 NOTE — ED Notes (Addendum)
Pt presents to department for evaluation of possible stroke. LSN: 10:00am this morning at work. Co-workers states she became rigid while sitting in chair and was unable to talk. Upon arrival to ED, R side noted to be weak. Garbled speech and R sided gaze. CBG 213. History of HTN and diabetes. 20g LAC.

## 2013-01-16 NOTE — Consult Note (Signed)
Referring Physician: Silverio Lay    Chief Complaint: Chest pain and right sided weakness with speech difficulties  HPI: Alexandra [name] is an 52 y.o. female who was at work today and began to complain of chest pain.  EMS was called and when they arrived the patient was hypertensive and had right sided weakness on their examination.  Patient was brought in at that time as a code stroke.    Date last known well: Date: 01/16/2013 Time last known well: Time: 10:00 tPA Given: No: Improving symptoms, not felt to be a stroke  Past Medical History  Diagnosis Date  . GERD (gastroesophageal reflux disease)     prn med.  . Hypothyroidism   . Hypertension     new dx. - will start med. 06/28/2011  . Sleep apnea     prior to gastric bypass - no sleep study since bypass  . Rotator cuff tear, right   . Diabetes mellitus   . Hyperlipidemia   . Hiatal hernia 1998  . Hyperplastic colon polyp   . Fatty liver     Past Surgical History  Procedure Laterality Date  . Cesarean section  1991  . Cholecystectomy  1989  . Shoulder arthroscopy distal clavicle excision and open rotator cuff repair  07/26/2010    right  . Shoulder arthroscopy  01/02/2006; 05/31/2004    left 2007; right 2005  . Exploratory laparotomy  05/08/2000    right salpingo-oophorectomy  . Abdominal hysterectomy  06/25/2000    left salpingo-oophorectomy  . 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 Laminotomy / excision disk posterior cervical spine  08/10/2009    C7-T1  . Thyroid surgery  early 2000s    goiter  . Cardiac catheterization  04/01/2006  . Shoulder arthroscopy  07/02/2011    Procedure: ARTHROSCOPY SHOULDER;  Surgeon: Wyn Forster., MD;  [facility]:  [facility];  Service: Orthopedics;  Laterality: Right;  repair supraspinatus, Debride glenohumeral joint, labrum  . Back surgery  1997    discetomy, lumbar spinal fusion  . Tubal ligation      Family History  Problem Relation Age of Onset  . Coronary artery  disease      -  Mother HTN and DM   -  Father  HTN  Social History:  reports that she has never smoked. She has never used smokeless tobacco. She reports that she does not drink alcohol or use illicit drugs.  Allergies:  Allergies  Allergen Reactions  . Shrimp Flavor Swelling    Swelling of tongue and rash  . Ace Inhibitors Cough  . Oxycodone-Acetaminophen Rash    Medications: I have reviewed the patient's current medications. Prior to Admission:  Current outpatient prescriptions: bisoprolol-hydrochlorothiazide (ZIAC) 5-6.25 MG per tablet, Take 1 tablet by mouth daily., Disp: , Rfl: ;   diclofenac sodium (VOLTAREN) 1 % GEL, Apply 1 application topically 4 (four) times daily., Disp: 100 g, Rfl: 0;  fluticasone (FLONASE) 50 MCG/ACT nasal spray, Place 2 sprays into the nose daily., Disp: 16 g, Rfl: 6 HYDROcodone-acetaminophen (NORCO/VICODIN) 5-325 MG per tablet, Take 1-2 tablets by mouth every 6 (six) hours as needed for pain., Disp: , Rfl: ;   levothyroxine (SYNTHROID, LEVOTHROID) 150 MCG tablet, TAKE 1 TABLET EVERY DAY, Disp: 90 tablet, Rfl: 3;  lidocaine (LIDODERM) 5 %, Place 3 patches onto the skin daily. Remove & Discard patch within 12 hours or as directed by MD, Disp: 90 patch, Rfl: 2 meclizine (ANTIVERT) 25 MG tablet, Take 1 tablet (25  mg total) by mouth 4 (four) times daily., Disp: 28 tablet, Rfl: 0;   metFORMIN (GLUCOPHAGE) 500 MG tablet, Take 1 tablet (500 mg total) by mouth 2 (two) times daily with a meal., Disp: 180 tablet, Rfl: 3;  [DISCONTINUED] DULoxetine (CYMBALTA) 20 MG capsule, Take 20 mg by mouth daily. Samples per Dr Lovell Sheehan, Disp: , Rfl:  [DISCONTINUED] gabapentin (NEURONTIN) 300 MG capsule, Take 300 mg by mouth 2 (two) times daily. Per Dr Teressa Senter, Disp: , Rfl:   ROS: History obtained from the patient  General ROS: negative for - chills, fatigue, fever, night sweats, weight gain or weight loss Psychological ROS: negative for - behavioral disorder, hallucinations,  memory difficulties, mood swings or suicidal ideation Ophthalmic ROS: negative for - blurry vision, double vision, eye pain or loss of vision ENT ROS: negative for - epistaxis, nasal discharge, oral lesions, sore throat, tinnitus or vertigo Allergy and Immunology ROS: negative for - hives or itchy/watery eyes Hematological and Lymphatic ROS: negative for - bleeding problems, bruising or swollen lymph nodes Endocrine ROS: negative for - galactorrhea, hair pattern changes, polydipsia/polyuria or temperature intolerance Respiratory ROS: negative for - cough, hemoptysis, shortness of breath or wheezing Cardiovascular ROS: chest pain Gastrointestinal ROS: negative for - abdominal pain, diarrhea, hematemesis, nausea/vomiting or stool incontinence Genito-Urinary ROS: negative for - dysuria, hematuria, incontinence or urinary frequency/urgency Musculoskeletal ROS: shoulder pain Neurological ROS: as noted in HPI Dermatological ROS: negative for rash and skin lesion changes  Physical Examination: Blood pressure 127/74, pulse 84, resp. rate 14, SpO2 95.00%.  Neurologic Examination: Mental Status: Alert, oriented, thought content appropriate.  Speech fluent but hesitant.  Able to follow 3 step commands without difficulty. Cranial Nerves: II: Discs flat bilaterally; Visual fields grossly normal, pupils equal, round, reactive to light and accommodation III,IV, VI: ptosis not present, extra-ocular motions intact bilaterally V,VII: smile symmetric, facial light touch sensation normal bilaterally VIII: hearing normal bilaterally IX,X: gag reflex present XI: bilateral shoulder shrug XII: midline tongue extension Motor: Right : Upper extremity   5/5    Left:     Upper extremity   5/5  Lower extremity   5/5     Lower extremity   5/5 Initially on using right patient unable to lift and maintain extremity but when bot hextremities teseted together  Tone and bulk:normal tone throughout; no atrophy  noted Sensory: Pinprick and light touch intact throughout, bilaterally Deep Tendon Reflexes: 2+ and symmetric throughout Plantars: Right: downgoing   Left: downgoing Cerebellar: normal finger-to-nose, normal rapid alternating movements and normal heel-to-shin test Gait: normal gait and station CV: pulses palpable throughout     Laboratory Studies:  Basic Metabolic Panel:  Recent Labs Lab 01/16/13 1053 01/16/13 1104  NA 137 140  K 3.5 3.5  CL 98 98  CO2 27  --   GLUCOSE 246* 246*  BUN 8 7  CREATININE 0.65 0.70  CALCIUM 8.9  --     Liver Function Tests:  Recent Labs Lab 01/16/13 1053  AST 20  ALT 19  ALKPHOS 89  BILITOT 0.3  PROT 7.4  ALBUMIN 3.6   No results found for this basename: LIPASE, AMYLASE,  in the last 168 hours No results found for this basename: AMMONIA,  in the last 168 hours  CBC:  Recent Labs Lab 01/16/13 1053 01/16/13 1104  WBC 10.2  --   NEUTROABS 6.2  --   HGB 13.0 13.9  HCT 38.4 41.0  MCV 88.9  --   PLT 369  --  Cardiac Enzymes:  Recent Labs Lab 01/16/13 1053  TROPONINI <0.30    BNP: No components found with this basename: POCBNP,   CBG:  Recent Labs Lab 01/16/13 1117  GLUCAP 206*    Microbiology: Results for orders placed in visit on 03/11/12  URINE CULTURE     Status: None   Collection Time    03/11/12  2:13 PM      Result Value Range Status   Colony Count 40,000 COLONIES/ML   Final   Organism ID, Bacteria Multiple bacterial morphotypes present, none   Final   Organism ID, Bacteria predominant. Suggest appropriate recollection if    Final   Organism ID, Bacteria clinically indicated.   Final    Coagulation Studies:  Recent Labs  01/16/13 1053  LABPROT 11.8  INR 0.87    Urinalysis: No results found for this basename: COLORURINE, APPERANCEUR, LABSPEC, PHURINE, GLUCOSEU, HGBUR, BILIRUBINUR, KETONESUR, PROTEINUR, UROBILINOGEN, NITRITE, LEUKOCYTESUR,  in the last 168 hours  Lipid Panel:     Component Value Date/Time   CHOL 160 03/22/2011 0900   TRIG 60.0 03/22/2011 0900   HDL 42.70 03/22/2011 0900   CHOLHDL 4 03/22/2011 0900   VLDL 12.0 03/22/2011 0900   LDLCALC 105* 03/22/2011 0900    HgbA1C:  Lab Results  Component Value Date   HGBA1C 6.9* 03/11/2012    Urine Drug Screen:     Component Value Date/Time   LABOPIA NTD 03/03/2009 1230   COCAINSCRNUR NTD 03/03/2009 1230   LABBENZ NTD 03/03/2009 1230   AMPHETMU NTD 03/03/2009 1230   THCU NTD 03/03/2009 1230   LABBARB NTD 03/03/2009 1230    Alcohol Level:   Recent Labs Lab 01/16/13 1053  ETH <11    Other results: EKG: sinus rhythm at 83 bpm  Imaging: Ct Head Wo Contrast  01/16/2013   *RADIOLOGY REPORT*  Clinical Data: Code stroke  CT HEAD WITHOUT CONTRAST  Technique:  Contiguous axial images were obtained from the base of the skull through the vertex without contrast.  Comparison: 07/03/2007  Findings: The brain has a normal appearance without evidence for hemorrhage, infarction, hydrocephalus, or mass lesion.  There is no extra axial fluid collection.  The skull and paranasal sinuses are normal.  IMPRESSION: Negative exam.   Original Report Authenticated By: Signa Kell, M.D.   Mr Brain Wo Contrast  01/16/2013   *RADIOLOGY REPORT*  Clinical Data: Heaviness in right arm.  Aphasia.  Stroke risk factors include obesity, hypertension, diabetes mellitus, and hyperlipidemia.  MRI HEAD WITHOUT CONTRAST  Technique:  Multiplanar, multiecho pulse sequences of the brain and surrounding structures were obtained according to standard protocol without intravenous contrast.  Comparison: CT head earlier in the day.  Findings: No acute stroke or hemorrhage.  No mass lesion or hydrocephalus.  No extra-axial fluid.  There may be a tiny remote lacunar infarct left posterolateral thalamus.  Normal cerebral volume.  No significant white matter disease.  No foci of chronic hemorrhage.  The calvarium is intact.  Flow voids are maintained in the major  intracranial vessels.  Pituitary and cerebellar tonsils unremarkable.  No orbital findings.  No acute sinus or mastoid disease.  IMPRESSION: Unremarkable noncontrast MRI of the head.  Specifically no evidence for restricted diffusion to suggest an acute infarct.   Original Report Authenticated By: Davonna Belling, M.D.   Dg Chest Portable 1 View  01/16/2013   *RADIOLOGY REPORT*  Clinical Data: Chest pressure.  Hypertension.  PORTABLE CHEST - 1 VIEW  Comparison: 03/03/2009  Findings: The heart,  mediastinum and hila are unremarkable.  The lungs are clear allowing for the semi-erect positioning and significant overlying soft tissues.  No pleural effusion or pneumothorax.  The bony thorax is intact.  IMPRESSION: No active disease of the chest.   Original Report Authenticated By: Amie Portland, M.D.    Assessment: 52 y.o. female presenting with chest pain, difficulty with speech and right sided weakness.  Exam with multiple functional features.  CT of the head reviewed and unremarkable.  MRI of the brain performed as well and has been reviewed.  It is unremarkable as well.  With continued evaluation weakness as improved but continues to stutter with speech.  Acute infarct or TIA unlikely.      Stroke Risk Factors - diabetes mellitus, hyperlipidemia and hypertension  Plan: 1. HgbA1c, fasting lipid panel 2. PT consult, OT consult, Speech consult 3. Prophylactic therapy-Antiplatelet med: Aspirin - dose 325mg  daily 4. Telemetry monitoring 5. Frequent neuro checks   Thana Farr, MD [facility] [phone] 01/16/2013, 1:11 PM

## 2013-01-16 NOTE — ED Notes (Signed)
Pt's CBG was 206 when I checked it.11:17 AM JG.

## 2013-01-16 NOTE — H&P (Signed)
[facility] History and Physical  Alexandra [name] ZOX:[id number] DOB: 08-May-1961 DOA: 01/16/2013  Referring physician: PCP: [name], MD  Specialists: Dr Katha Cabal (Cardiology)  Chief Complaint: Left-sided hemiparesis, chest pain   HPI: Alexandra [name] is a 52 y.o.  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 She also was noted to have left-sided weakness and facial droop by EMS. Upon arrival to ED patient continued to have signs and symptoms consistent with TIA versus CVA (slurred speech, left-sided upper and lower extremity weakness).  Head MRI was obtained and was within normal limits. Patient also complained of chest pain rated at 4/10, positive radiation to right jawline and arm, positive diaphoresis, negative shortness of breath, described pain as a person sitting on her chest. Treated with sublingual nitroglycerin x2 and pain decreased to 2/10. Patient states has had previous episodes of chest pain brought on by exertion, resolved with rest. Currently chest pain 3/10; treated with sublingual nitroglycerin   and chest pain decreased to 1/10. Patient resting comfortably extensive family present at bedside. Addendum; Pt now states was given aspirin 325 mg (verified).  Review of Systems: The patient denies anorexia, fever, weight loss,, vision loss, decreased hearing, hoarseness, syncope, dyspnea on exertion, peripheral edema,, hemoptysis, abdominal pain, melena, hematochezia, severe indigestion/heartburn, hematuria, incontinence, genital sores, muscle weakness, suspicious skin lesions, transient blindness,  depression, unusual weight change, abnormal bleeding, enlarged lymph nodes, angioedema, and breast masses.   Positive; balance deficits, difficulty walking, chest pain,  Past Medical History  Diagnosis Date  . GERD  (gastroesophageal reflux disease)     prn med.  . Hypothyroidism   . Hypertension     new dx. - will start med. 06/28/2011  . Sleep apnea     prior to gastric bypass - no sleep study since bypass  . Rotator cuff tear, right   . Diabetes mellitus   . Hyperlipidemia   . Hiatal hernia 1998  . Hyperplastic colon polyp   . Fatty liver   . Hyperthyroidism    Past Surgical History  Procedure Laterality Date  . Cesarean section  1991  . Cholecystectomy  1989  . Shoulder arthroscopy distal clavicle excision and open rotator cuff repair  07/26/2010    right  . Shoulder arthroscopy  01/02/2006; 05/31/2004    left 2007; right 2005  . Exploratory laparotomy  05/08/2000    right salpingo-oophorectomy  . Abdominal hysterectomy  06/25/2000    left salpingo-oophorectomy  . 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 Laminotomy / excision disk posterior cervical spine  08/10/2009    C7-T1  . Thyroid surgery  early 2000s    goiter  . Cardiac catheterization  04/01/2006  . Shoulder arthroscopy  07/02/2011    Procedure: ARTHROSCOPY SHOULDER;  Surgeon: Wyn Forster., MD;  Location: [facility];  Service: Orthopedics;  Laterality: Right;  repair supraspinatus, Debride glenohumeral joint, labrum  . Back surgery  1997    discetomy, lumbar spinal fusion  . Tubal ligation     Social History:  reports that she has never smoked. She has never used smokeless tobacco. She reports that she does not drink alcohol or use illicit drugs.married, 5 children  Can patient participate in ADLs; yes  Allergies  Allergen Reactions  . Shrimp Flavor Swelling    Swelling of tongue and rash  .  Ace Inhibitors Cough  . Oxycodone-Acetaminophen Rash    Family History  Problem Relation Age of Onset  . Coronary artery disease Hypertension  Diabetes  Hyperlipidemia  MI MI  mother Mother  Mother  Mother  maternal grandmother randmother Paternal grandmother       Prior to Admission medications    Medication Sig Start Date End Date Taking? Authorizing Provider  bisoprolol-hydrochlorothiazide (ZIAC) 5-6.25 MG per tablet Take 1 tablet by mouth daily.   Yes Historical Provider, MD  diclofenac sodium (VOLTAREN) 1 % GEL Apply 1 application topically 4 (four) times daily. 03/08/12  Yes Luiz Blare, MD  fluticasone (FLONASE) 50 MCG/ACT nasal spray Place 2 sprays into the nose daily. 10/26/12  Yes Terressa Koyanagi, DO  HYDROcodone-acetaminophen (NORCO/VICODIN) 5-325 MG per tablet Take 1-2 tablets by mouth every 6 (six) hours as needed for pain.   Yes Historical Provider, MD  levothyroxine (SYNTHROID, LEVOTHROID) 150 MCG tablet TAKE 1 TABLET EVERY DAY 12/16/12  Yes Stacie Glaze, MD  lidocaine (LIDODERM) 5 % Place 3 patches onto the skin daily. Remove & Discard patch within 12 hours or as directed by MD 12/16/12  Yes Stacie Glaze, MD  meclizine (ANTIVERT) 25 MG tablet Take 1 tablet (25 mg total) by mouth 4 (four) times daily. 12/10/12  Yes Reuben Likes, MD   Physical Exam: Filed Vitals:   01/16/13 1109 01/16/13 1410 01/16/13 1500 01/16/13 1600  BP: 127/74 123/72 109/68 113/71  Pulse: 84 80 76 83  Temp:  98.5 F (36.9 C)    TempSrc:  Oral  Oral  Resp: 14 20    Height:  5\' 2"  (1.575 m)    Weight:  86.8 kg (191 lb 5.8 oz)    SpO2: 95% 98% 95% 93%     General: Alert and oriented x4 Eyes:   Cardiovascular: Regular rhythm or rate, negative murmurs rubs or gallops, TP/DP pulses 2+  Respiratory: Clear to auscultation bilaterally  Abdomen: Soft nontender, nondistended, plus bowel sounds  Neurologic: Alert and oriented x4, Pupils equal round reactive to light and accommodation, cranial nerves II through XII are normal limits, patient positive for dysarthria (mild), mild word searching, negative Romberg, negative pronator drift, normal 2. discrimination, mild loss of grip strength in left hand.  Labs on Admission:  Basic Metabolic Panel:  Recent Labs Lab 01/16/13 1053 01/16/13 1104   NA 137 140  K 3.5 3.5  CL 98 98  CO2 27  --   GLUCOSE 246* 246*  BUN 8 7  CREATININE 0.65 0.70  CALCIUM 8.9  --    Liver Function Tests:  Recent Labs Lab 01/16/13 1053  AST 20  ALT 19  ALKPHOS 89  BILITOT 0.3  PROT 7.4  ALBUMIN 3.6   No results found for this basename: LIPASE, AMYLASE,  in the last 168 hours No results found for this basename: AMMONIA,  in the last 168 hours CBC:  Recent Labs Lab 01/16/13 1053 01/16/13 1104  WBC 10.2  --   NEUTROABS 6.2  --   HGB 13.0 13.9  HCT 38.4 41.0  MCV 88.9  --   PLT 369  --    Cardiac Enzymes:  Recent Labs Lab 01/16/13 1053  TROPONINI <0.30    BNP (last 3 results) No results found for this basename: PROBNP,  in the last 8760 hours CBG:  Recent Labs Lab 01/16/13 1117 01/16/13 1640  GLUCAP 206* 252*    Radiological Exams on Admission: Ct Head Wo Contrast  01/16/2013   *  RADIOLOGY REPORT*  Clinical Data: Code stroke  CT HEAD WITHOUT CONTRAST  Technique:  Contiguous axial images were obtained from the base of the skull through the vertex without contrast.  Comparison: 07/03/2007  Findings: The brain has a normal appearance without evidence for hemorrhage, infarction, hydrocephalus, or mass lesion.  There is no extra axial fluid collection.  The skull and paranasal sinuses are normal.  IMPRESSION: Negative exam.   Original Report Authenticated By: Signa Kell, M.D.   Mr Brain Wo Contrast  01/16/2013   *RADIOLOGY REPORT*  Clinical Data: Heaviness in right arm.  Aphasia.  Stroke risk factors include obesity, hypertension, diabetes mellitus, and hyperlipidemia.  MRI HEAD WITHOUT CONTRAST  Technique:  Multiplanar, multiecho pulse sequences of the brain and surrounding structures were obtained according to standard protocol without intravenous contrast.  Comparison: CT head earlier in the day.  Findings: No acute stroke or hemorrhage.  No mass lesion or hydrocephalus.  No extra-axial fluid.  There may be a tiny remote  lacunar infarct left posterolateral thalamus.  Normal cerebral volume.  No significant white matter disease.  No foci of chronic hemorrhage.  The calvarium is intact.  Flow voids are maintained in the major intracranial vessels.  Pituitary and cerebellar tonsils unremarkable.  No orbital findings.  No acute sinus or mastoid disease.  IMPRESSION: Unremarkable noncontrast MRI of the head.  Specifically no evidence for restricted diffusion to suggest an acute infarct.   Original Report Authenticated By: Davonna Belling, M.D.   Dg Chest Portable 1 View  01/16/2013   *RADIOLOGY REPORT*  Clinical Data: Chest pressure.  Hypertension.  PORTABLE CHEST - 1 VIEW  Comparison: 03/03/2009  Findings: The heart, mediastinum and hila are unremarkable.  The lungs are clear allowing for the semi-erect positioning and significant overlying soft tissues.  No pleural effusion or pneumothorax.  The bony thorax is intact.  IMPRESSION: No active disease of the chest.   Original Report Authenticated By: Amie Portland, M.D.    EKG: EKG compared to previous EKG dated 12/10/2012. Normal sinus rhythm, normal axis, incomplete right bundle-branch block, normal QT interval, nonspecific ST -T wave changes in leads III, aVF. Cannot rule out ischemic changes   Assessment/Plan Principal Problem: TIA   Hemiplegia, unspecified, affecting dominant side Active Problems:   HYPOTHYROIDISM   Chest pain   Slurred speech    Diabetes   1. TIA; and signs and symptoms are resolving. Continues to have some mild dysarthria, mild left hand weakness. MRI was negative for acute CVA. Started on aspirin 325 mg daily. We'll consult PT, OT., speech. 2. Chest pain; patient signs and symptoms, EKG changes, negative cardiac enzymes, consistent with angina. Patient and family council: Patient's extensive risk factors. Will obtain cardiac echo. Cardiology consulted 3. Hypothyroidism; thyroid labs from May 2014 showed patient had become hypothyroid. Obtain labs to  determine if medication needs to be adjusted. Continue current thyroid dose until labs returned. 4. Diabetes; stopped metformin secondary to possibility of cardiac catheterization. Patient to be control on SSI. I will obtain hemoglobin A1c 5. OSA; patient currently not on CPAP however prior to bariatric surgery was required to wear. Has never returned for evaluation post bariatric surgery. Patient's Epworth score 16. After resolution of above health issues patients will require a sleep study.  Cardiology ( Dr Katha Cabal),  neurology (Dr. Thad Ranger)  Code Status: Full (must indicate code status--if unknown or must be presumed, indicate so) Family Communication: Plan discussed with patient in significant portion of the family members  Disposition Plan:  Dependent on cardiology recommendations  Time spent: 90 minutes  Erykah Lippert, Roselind Messier [facility] Pager [phone]  If 7PM-7AM, please contact night-coverage [email or web address] Password [facility] 01/16/2013, 9:54 PM

## 2013-01-17 DIAGNOSIS — G459 Transient cerebral ischemic attack, unspecified: Secondary | ICD-10-CM

## 2013-01-17 DIAGNOSIS — R4789 Other speech disturbances: Secondary | ICD-10-CM

## 2013-01-17 DIAGNOSIS — E119 Type 2 diabetes mellitus without complications: Secondary | ICD-10-CM

## 2013-01-17 LAB — GLUCOSE, CAPILLARY
Glucose-Capillary: 160 mg/dL — ABNORMAL HIGH (ref 70–99)
Glucose-Capillary: 196 mg/dL — ABNORMAL HIGH (ref 70–99)

## 2013-01-17 LAB — T4, FREE: Free T4: 1.37 ng/dL (ref 0.80–1.80)

## 2013-01-17 LAB — HEMOGLOBIN A1C
Hgb A1c MFr Bld: 7.8 % — ABNORMAL HIGH (ref <5.7)
Mean Plasma Glucose: 177 mg/dL — ABNORMAL HIGH (ref <117)

## 2013-01-17 NOTE — Progress Notes (Signed)
Utilization Review Completed.Alexandra [name] T6/22/2014  

## 2013-01-17 NOTE — Progress Notes (Signed)
Patient ID: Alexandra [name]  female  WUJ:[id number]    DOB: [birth year]    DOA: 01/16/2013  PCP: [name], MD  Assessment/Plan: Principal Problem:   TIA (transient ischemic attack) with stuttering speech, ?right side weakness - MRI of the negative for acute stroke.  - Continue aspirin, 2-D echo pending - Patient still has stuttering speech, discussed with neurology, no acute CVA on the MRI, possibility of functional deficits  Active Problems:   HYPOTHYROIDISM - Continue Synthroid    Chest pain - 2 sets of cardiac enzymes negative, 2-D echo pending, cardiology consulted    Diabetes -Continue sliding scale insulin  DVT Prophylaxis:  Code Status:  Disposition:Possibly tomorrow, await cardiac evaluation    Subjective: States chest pain improving however speech still stuttering, no significant focal weakness  Objective: Weight change:   Intake/Output Summary (Last 24 hours) at 01/17/13 0933 Last data filed at 01/17/13 0500  Gross per 24 hour  Intake    240 ml  Output   1025 ml  Net   -785 ml   Blood pressure 112/66, pulse 78, temperature 98.1 F (36.7 C), temperature source Oral, resp. rate 20, height 5\' 2"  (1.575 m), weight 86.8 kg (191 lb 5.8 oz), SpO2 95.00%.  Physical Exam: General: Alert and awake, oriented x3, not in any acute distress, stuttering speech with appropriate content. CVS: S1-S2 clear, no murmur rubs or gallops Chest: clear to auscultation bilaterally, no wheezing, rales or rhonchi Abdomen: soft nontender, nondistended, normal bowel sounds  Extremities: no cyanosis, clubbing or edema noted bilaterally Neuro: no focal neurological deficits, Strength 5 x 5 in upper and lower extremities bilaterally  Lab Results: Basic Metabolic Panel:  Recent Labs Lab 01/16/13 1053 01/16/13 1104  NA 137 140  K 3.5 3.5  CL 98 98  CO2 27  --   GLUCOSE 246* 246*  BUN 8 7  CREATININE 0.65 0.70  CALCIUM 8.9  --    Liver Function Tests:  Recent  Labs Lab 01/16/13 1053  AST 20  ALT 19  ALKPHOS 89  BILITOT 0.3  PROT 7.4  ALBUMIN 3.6   No results found for this basename: LIPASE, AMYLASE,  in the last 168 hours No results found for this basename: AMMONIA,  in the last 168 hours CBC:  Recent Labs Lab 01/16/13 1053 01/16/13 1104  WBC 10.2  --   NEUTROABS 6.2  --   HGB 13.0 13.9  HCT 38.4 41.0  MCV 88.9  --   PLT 369  --    Cardiac Enzymes:  Recent Labs Lab 01/16/13 1053 01/16/13 2205  TROPONINI <0.30 <0.30   BNP: No components found with this basename: POCBNP,  CBG:  Recent Labs Lab 01/16/13 1117 01/16/13 1640 01/16/13 2056 01/17/13 0741  GLUCAP 206* 252* 149* 160*     Micro Results: No results found for this or any previous visit (from the past 240 hour(s)).  Studies/Results: Ct Head Wo Contrast  01/16/2013   *RADIOLOGY REPORT*  Clinical Data: Code stroke  CT HEAD WITHOUT CONTRAST  Technique:  Contiguous axial images were obtained from the base of the skull through the vertex without contrast.  Comparison: 07/03/2007  Findings: The brain has a normal appearance without evidence for hemorrhage, infarction, hydrocephalus, or mass lesion.  There is no extra axial fluid collection.  The skull and paranasal sinuses are normal.  IMPRESSION: Negative exam.   Original Report Authenticated By: Signa Kell, M.D.   Mr Brain Wo Contrast  01/16/2013   *RADIOLOGY REPORT*  Clinical  Data: Heaviness in right arm.  Aphasia.  Stroke risk factors include obesity, hypertension, diabetes mellitus, and hyperlipidemia.  MRI HEAD WITHOUT CONTRAST  Technique:  Multiplanar, multiecho pulse sequences of the brain and surrounding structures were obtained according to standard protocol without intravenous contrast.  Comparison: CT head earlier in the day.  Findings: No acute stroke or hemorrhage.  No mass lesion or hydrocephalus.  No extra-axial fluid.  There may be a tiny remote lacunar infarct left posterolateral thalamus.  Normal  cerebral volume.  No significant white matter disease.  No foci of chronic hemorrhage.  The calvarium is intact.  Flow voids are maintained in the major intracranial vessels.  Pituitary and cerebellar tonsils unremarkable.  No orbital findings.  No acute sinus or mastoid disease.  IMPRESSION: Unremarkable noncontrast MRI of the head.  Specifically no evidence for restricted diffusion to suggest an acute infarct.   Original Report Authenticated By: Davonna Belling, M.D.   Mr Lumbar Spine Wo Contrast  12/20/2012   *RADIOLOGY REPORT*  Clinical Data: Low back pain extending to the right leg.  Numbness and tingling.  History of previous fusion.  MRI LUMBAR SPINE WITHOUT CONTRAST  Technique:  Multiplanar and multiecho pulse sequences of the lumbar spine were obtained without intravenous contrast.  Comparison: Report from examination 12/04/1999.  Actual films no longer available.  CT abdomen 03/27/2012  Findings: There is curvature convex to the right with the apex at L3.  T11-12:  Unremarkable interspace.  T12-L1:  Broad-based disc herniation effaces the ventral subarachnoid space and indents the distal cord slightly.  There is mild foraminal narrowing on the right. This is largely calcified based on the previous CT.  L1-2:  Bilobed right posterolateral and left posterolateral disc herniation more prominent on the right.  This indents the thecal sac but does not appear to cause definite neural compression. This is largely calcified based on the previous CT.  The conus tip is at upper L2.  L2-3:  Bulging of the disc more towards the left.  No definite neural compression.  L3-4:  Bulging of the disc.  Bilateral facet degeneration.  No apparent neural compression.  L4-5:  Previous posterior decompression.  L4 is fused 9 mm anterior relative to L5.  There is mild chronic foraminal narrowing without definite compression of the exiting L4 nerve roots.  L5-S1:  Posterior decompression and fusion.  Wide patency of the canal and  foramina.  IMPRESSION: There has been decompression and fusion from L4 to the sacrum.  L4 is fused 9 mm anterior relative to L5.  There is sufficient patency of the central canal through the fusion region.  There is foraminal narrowing at the L4-5 level, but this is obviously chronic.  L3-4 shows adjacent segment facet arthropathy but there is no slippage.  There is no disc pathology.  There is no stenosis or neural compression.  T12-L1:  Chronic calcified broad-based disc herniation indents the thecal sac but does not cause definite neural compression.  L1-2:  Chronic bilobed disc herniation more prominent towards the right which is also calcified.  Similarly, this indents the thecal sac but does not appear to cause definite neural compression.   Original Report Authenticated By: Paulina Fusi, M.D.   Dg Epidurography  01/05/2013   *RADIOLOGY REPORT*  CLINICAL DATA:  Lumbosacral spondylosis without myelopathy.  Right S1 radiculitis.  Displacement of the L4-5 lumbar disc associated with anterolisthesis.  LUMBAR EPIDURAL INJECTION: An interlaminar approach was performed on the right at L5-S1.  The overlying skin  was cleansed and anesthetized.  A 20 gauge spinal needle was advanced using loss-of-resistance technique. Significant facet arthropathy and posterior osseous overgrowth is evident at L5- S1.  A more midline approach was taken.  Injection of 2cc of Omnipaque 180 confirmed epidural placement.  There was no evidence for intravascular or intrathecal spread of contrast.  I then injected 120 mg of Depo-Medrol and 3ml of 1% lidocaine. Following the injection, the patient had some parasthesias in both lower extremities, suggesting some degree of intrathecal lidocaine. She was observed in the the nurses station while the parasthesias resolved and discharged in stable neurologic condition.  FLUORO TIME:  2 minutes 34 seconds  IMPRESSIONS:  Technically successful first interlaminar epidural steroid injection on the  right at L5-S1.  Given the osseous restrictions at L5-S1, a caudal approach for epidural injections may be more useful subsequently.   Original Report Authenticated By: Marin Roberts, M.D.   Dg Chest Portable 1 View  01/16/2013   *RADIOLOGY REPORT*  Clinical Data: Chest pressure.  Hypertension.  PORTABLE CHEST - 1 VIEW  Comparison: 03/03/2009  Findings: The heart, mediastinum and hila are unremarkable.  The lungs are clear allowing for the semi-erect positioning and significant overlying soft tissues.  No pleural effusion or pneumothorax.  The bony thorax is intact.  IMPRESSION: No active disease of the chest.   Original Report Authenticated By: Amie Portland, M.D.    Medications: Scheduled Meds: . aspirin  325 mg Oral Once  . aspirin  325 mg Oral Daily  . bisoprolol-hydrochlorothiazide  1 tablet Oral Daily  . diclofenac sodium  2 g Topical QID  . fluticasone  2 spray Each Nare Daily  . insulin aspart  0-20 Units Subcutaneous TID WC  . insulin aspart  0-5 Units Subcutaneous QHS  . levothyroxine  150 mcg Oral QAC breakfast  . lidocaine  3 patch Transdermal Q24H  . meclizine  25 mg Oral QID      LOS: 1 day   Tyeson Tanimoto M.D. [facility] 01/17/2013, 9:33 AM Pager: [phone]  If 7PM-7AM, please contact night-coverage [email or web address] Password [email or web address]

## 2013-01-17 NOTE — Progress Notes (Addendum)
SUBJECTIVE:  No further CP since last PM  OBJECTIVE:   Vitals:   Filed Vitals:   01/16/13 2000 01/16/13 2200 01/17/13 0000 01/17/13 0500  BP: 131/60 109/64 107/58 112/66  Pulse: 81 80 75 78  Temp: 97.7 F (36.5 C) 98 F (36.7 C) 98.2 F (36.8 C) 98.1 F (36.7 C)  TempSrc:      Resp: 18 16 16 20   Height:      Weight:      SpO2: 98% 97% 96% 95%   I&O's:   Intake/Output Summary (Last 24 hours) at 01/17/13 1152 Last data filed at 01/17/13 0900  Gross per 24 hour  Intake    600 ml  Output   1025 ml  Net   -425 ml   TELEMETRY: Reviewed telemetry pt in NSR:  PHYSICAL EXAM General: Well developed, well nourished, in no acute distress Head: Eyes PERRLA, No xanthomas.   Normal cephalic and atramatic  Lungs:   Clear bilaterally to auscultation and percussion. Heart:   HRRR S1 S2 Pulses are 2+ & equal. Abdomen: Bowel sounds are positive, abdomen soft and non-tender without masses Extremities:   No clubbing, cyanosis or edema.  DP +1 Neuro: Alert and oriented X 3. Psych:  Good affect, responds appropriately   LABS: Basic Metabolic Panel:  Recent Labs  81/44/81 1053 01/16/13 1104  NA 137 140  K 3.5 3.5  CL 98 98  CO2 27  --   GLUCOSE 246* 246*  BUN 8 7  CREATININE 0.65 0.70  CALCIUM 8.9  --    Liver Function Tests:  Recent Labs  01/16/13 1053  AST 20  ALT 19  ALKPHOS 89  BILITOT 0.3  PROT 7.4  ALBUMIN 3.6   No results found for this basename: LIPASE, AMYLASE,  in the last 72 hours CBC:  Recent Labs  01/16/13 1053 01/16/13 1104  WBC 10.2  --   NEUTROABS 6.2  --   HGB 13.0 13.9  HCT 38.4 41.0  MCV 88.9  --   PLT 369  --    Cardiac Enzymes:  Recent Labs  01/16/13 1053 01/16/13 2205  TROPONINI <0.30 <0.30   Coag Panel:   Lab Results  Component Value Date   INR 0.87 01/16/2013    RADIOLOGY: Ct Head Wo Contrast  01/16/2013   *RADIOLOGY REPORT*  Clinical Data: Code stroke  CT HEAD WITHOUT CONTRAST  Technique:  Contiguous axial images were  obtained from the base of the skull through the vertex without contrast.  Comparison: 07/03/2007  Findings: The brain has a normal appearance without evidence for hemorrhage, infarction, hydrocephalus, or mass lesion.  There is no extra axial fluid collection.  The skull and paranasal sinuses are normal.  IMPRESSION: Negative exam.   Original Report Authenticated By: Signa Kell, M.D.   Mr Brain Wo Contrast  01/16/2013   *RADIOLOGY REPORT*  Clinical Data: Heaviness in right arm.  Aphasia.  Stroke risk factors include obesity, hypertension, diabetes mellitus, and hyperlipidemia.  MRI HEAD WITHOUT CONTRAST  Technique:  Multiplanar, multiecho pulse sequences of the brain and surrounding structures were obtained according to standard protocol without intravenous contrast.  Comparison: CT head earlier in the day.  Findings: No acute stroke or hemorrhage.  No mass lesion or hydrocephalus.  No extra-axial fluid.  There may be a tiny remote lacunar infarct left posterolateral thalamus.  Normal cerebral volume.  No significant white matter disease.  No foci of chronic hemorrhage.  The calvarium is intact.  Flow voids are maintained  in the major intracranial vessels.  Pituitary and cerebellar tonsils unremarkable.  No orbital findings.  No acute sinus or mastoid disease.  IMPRESSION: Unremarkable noncontrast MRI of the head.  Specifically no evidence for restricted diffusion to suggest an acute infarct.   Original Report Authenticated By: Davonna Belling, M.D.   Mr Lumbar Spine Wo Contrast  12/20/2012   *RADIOLOGY REPORT*  Clinical Data: Low back pain extending to the right leg.  Numbness and tingling.  History of previous fusion.  MRI LUMBAR SPINE WITHOUT CONTRAST  Technique:  Multiplanar and multiecho pulse sequences of the lumbar spine were obtained without intravenous contrast.  Comparison: Report from examination 12/04/1999.  Actual films no longer available.  CT abdomen 03/27/2012  Findings: There is curvature  convex to the right with the apex at L3.  T11-12:  Unremarkable interspace.  T12-L1:  Broad-based disc herniation effaces the ventral subarachnoid space and indents the distal cord slightly.  There is mild foraminal narrowing on the right. This is largely calcified based on the previous CT.  L1-2:  Bilobed right posterolateral and left posterolateral disc herniation more prominent on the right.  This indents the thecal sac but does not appear to cause definite neural compression. This is largely calcified based on the previous CT.  The conus tip is at upper L2.  L2-3:  Bulging of the disc more towards the left.  No definite neural compression.  L3-4:  Bulging of the disc.  Bilateral facet degeneration.  No apparent neural compression.  L4-5:  Previous posterior decompression.  L4 is fused 9 mm anterior relative to L5.  There is mild chronic foraminal narrowing without definite compression of the exiting L4 nerve roots.  L5-S1:  Posterior decompression and fusion.  Wide patency of the canal and foramina.  IMPRESSION: There has been decompression and fusion from L4 to the sacrum.  L4 is fused 9 mm anterior relative to L5.  There is sufficient patency of the central canal through the fusion region.  There is foraminal narrowing at the L4-5 level, but this is obviously chronic.  L3-4 shows adjacent segment facet arthropathy but there is no slippage.  There is no disc pathology.  There is no stenosis or neural compression.  T12-L1:  Chronic calcified broad-based disc herniation indents the thecal sac but does not cause definite neural compression.  L1-2:  Chronic bilobed disc herniation more prominent towards the right which is also calcified.  Similarly, this indents the thecal sac but does not appear to cause definite neural compression.   Original Report Authenticated By: Paulina Fusi, M.D.   Dg Epidurography  01/05/2013   *RADIOLOGY REPORT*  CLINICAL DATA:  Lumbosacral spondylosis without myelopathy.  Right S1  radiculitis.  Displacement of the L4-5 lumbar disc associated with anterolisthesis.  LUMBAR EPIDURAL INJECTION: An interlaminar approach was performed on the right at L5-S1.  The overlying skin was cleansed and anesthetized.  A 20 gauge spinal needle was advanced using loss-of-resistance technique. Significant facet arthropathy and posterior osseous overgrowth is evident at L5- S1.  A more midline approach was taken.  Injection of 2cc of Omnipaque 180 confirmed epidural placement.  There was no evidence for intravascular or intrathecal spread of contrast.  I then injected 120 mg of Depo-Medrol and 3ml of 1% lidocaine. Following the injection, the patient had some parasthesias in both lower extremities, suggesting some degree of intrathecal lidocaine. She was observed in the the nurses station while the parasthesias resolved and discharged in stable neurologic condition.  FLUORO TIME:  2 minutes 34 seconds  IMPRESSIONS:  Technically successful first interlaminar epidural steroid injection on the right at L5-S1.  Given the osseous restrictions at L5-S1, a caudal approach for epidural injections may be more useful subsequently.   Original Report Authenticated By: Marin Roberts, M.D.   Dg Chest Portable 1 View  01/16/2013   *RADIOLOGY REPORT*  Clinical Data: Chest pressure.  Hypertension.  PORTABLE CHEST - 1 VIEW  Comparison: 03/03/2009  Findings: The heart, mediastinum and hila are unremarkable.  The lungs are clear allowing for the semi-erect positioning and significant overlying soft tissues.  No pleural effusion or pneumothorax.  The bony thorax is intact.  IMPRESSION: No active disease of the chest.   Original Report Authenticated By: Amie Portland, M.D.   Assessment/Plan:  1. Chest pain: She has multiple risk factors for CAD including a CAD-equivalent (DM). Ruled out for MI with serial enzymes.   She had a cath in 2007 by Dr. Juanda Chance that showed no CAD.   Would not proceed directly with cath given new  CVA.  Would recommend chemical stress test for further risk stratification when ok by Neuro if echo shows normal LVF 2. HTN: Currently well-controlled  3. Hyperlipidemia: Patient will be started on a statin. Fasting lipids pending.  4.  CVA ? Etiology 2D echo pending. Patient has history of palpitations in the past so need to consider PAF - will need outpt [employer] monitor - she has seen Dr. Ladona Ridgel in the past for prolonged QT interval    Quintella Reichert, MD  01/17/2013  11:52 AM

## 2013-01-17 NOTE — Progress Notes (Signed)
Subjective: Patient has improved since presentation but continues to have stuttering speech.  MRI and head CT unremarkable.  Objective: Current vital signs: BP 112/66  Pulse 78  Temp(Src) 98.1 F (36.7 C) (Oral)  Resp 20  Ht 5\' 2"  (1.575 m)  Wt 86.8 kg (191 lb 5.8 oz)  BMI 34.99 kg/m2  SpO2 95% Vital signs in last 24 hours: Temp:  [97.7 F (36.5 C)-98.5 F (36.9 C)] 98.1 F (36.7 C) (06/22 0500) Pulse Rate:  [75-84] 78 (06/22 0500) Resp:  [14-20] 20 (06/22 0500) BP: (107-131)/(58-74) 112/66 mmHg (06/22 0500) SpO2:  [93 %-98 %] 95 % (06/22 0500) Weight:  [86.8 kg (191 lb 5.8 oz)] 86.8 kg (191 lb 5.8 oz) (06/21 1410)  Intake/Output from previous day: 06/21 0701 - 06/22 0700 In: 240 [P.O.:240] Out: 1025 [Urine:1025] Intake/Output this shift:   Nutritional status: Cardiac  Neurologic Exam: Mental Status: Alert, oriented, thought content appropriate.  Speech stuttering but fluent without evidence of aphasia.  Able to follow 3 step commands without difficulty. Cranial Nerves: II: Discs flat bilaterally; Visual fields grossly normal, pupils equal, round, reactive to light and accommodation III,IV, VI: ptosis not present, extra-ocular motions intact bilaterally V,VII: smile symmetric, facial light touch sensation normal bilaterally VIII: hearing normal bilaterally IX,X: gag reflex present XI: bilateral shoulder shrug XII: midline tongue extension Motor: Right : Upper extremity   5/5    Left:     Upper extremity   5/5  Lower extremity   5/5     Lower extremity   5/5 Tone and bulk:normal tone throughout; no atrophy noted Sensory: Pinprick and light touch intact throughout, bilaterally Deep Tendon Reflexes: 2+ and symmetric throughout Plantars: Right: downgoing   Left: downgoing Cerebellar: normal finger-to-nose, normal rapid alternating movements and normal heel-to-shin test   Lab Results: Basic Metabolic Panel:  Recent Labs Lab 01/16/13 1053 01/16/13 1104  NA 137  140  K 3.5 3.5  CL 98 98  CO2 27  --   GLUCOSE 246* 246*  BUN 8 7  CREATININE 0.65 0.70  CALCIUM 8.9  --     Liver Function Tests:  Recent Labs Lab 01/16/13 1053  AST 20  ALT 19  ALKPHOS 89  BILITOT 0.3  PROT 7.4  ALBUMIN 3.6   No results found for this basename: LIPASE, AMYLASE,  in the last 168 hours No results found for this basename: AMMONIA,  in the last 168 hours  CBC:  Recent Labs Lab 01/16/13 1053 01/16/13 1104  WBC 10.2  --   NEUTROABS 6.2  --   HGB 13.0 13.9  HCT 38.4 41.0  MCV 88.9  --   PLT 369  --     Cardiac Enzymes:  Recent Labs Lab 01/16/13 1053 01/16/13 2205  TROPONINI <0.30 <0.30    Lipid Panel: No results found for this basename: CHOL, TRIG, HDL, CHOLHDL, VLDL, LDLCALC,  in the last 168 hours  CBG:  Recent Labs Lab 01/16/13 1117 01/16/13 1640 01/16/13 2056 01/17/13 0741  GLUCAP 206* 252* 149* 160*    Microbiology: Results for orders placed in visit on 03/11/12  URINE CULTURE     Status: None   Collection Time    03/11/12  2:13 PM      Result Value Range Status   Colony Count 40,000 COLONIES/ML   Final   Organism ID, Bacteria Multiple bacterial morphotypes present, none   Final   Organism ID, Bacteria predominant. Suggest appropriate recollection if    Final   Organism ID, Bacteria  clinically indicated.   Final    Coagulation Studies:  Recent Labs  01/16/13 1053  LABPROT 11.8  INR 0.87    Imaging: Ct Head Wo Contrast  01/16/2013   *RADIOLOGY REPORT*  Clinical Data: Code stroke  CT HEAD WITHOUT CONTRAST  Technique:  Contiguous axial images were obtained from the base of the skull through the vertex without contrast.  Comparison: 07/03/2007  Findings: The brain has a normal appearance without evidence for hemorrhage, infarction, hydrocephalus, or mass lesion.  There is no extra axial fluid collection.  The skull and paranasal sinuses are normal.  IMPRESSION: Negative exam.   Original Report Authenticated By: Signa Kell, M.D.   Mr Brain Wo Contrast  01/16/2013   *RADIOLOGY REPORT*  Clinical Data: Heaviness in right arm.  Aphasia.  Stroke risk factors include obesity, hypertension, diabetes mellitus, and hyperlipidemia.  MRI HEAD WITHOUT CONTRAST  Technique:  Multiplanar, multiecho pulse sequences of the brain and surrounding structures were obtained according to standard protocol without intravenous contrast.  Comparison: CT head earlier in the day.  Findings: No acute stroke or hemorrhage.  No mass lesion or hydrocephalus.  No extra-axial fluid.  There may be a tiny remote lacunar infarct left posterolateral thalamus.  Normal cerebral volume.  No significant white matter disease.  No foci of chronic hemorrhage.  The calvarium is intact.  Flow voids are maintained in the major intracranial vessels.  Pituitary and cerebellar tonsils unremarkable.  No orbital findings.  No acute sinus or mastoid disease.  IMPRESSION: Unremarkable noncontrast MRI of the head.  Specifically no evidence for restricted diffusion to suggest an acute infarct.   Original Report Authenticated By: Davonna Belling, M.D.   Dg Chest Portable 1 View  01/16/2013   *RADIOLOGY REPORT*  Clinical Data: Chest pressure.  Hypertension.  PORTABLE CHEST - 1 VIEW  Comparison: 03/03/2009  Findings: The heart, mediastinum and hila are unremarkable.  The lungs are clear allowing for the semi-erect positioning and significant overlying soft tissues.  No pleural effusion or pneumothorax.  The bony thorax is intact.  IMPRESSION: No active disease of the chest.   Original Report Authenticated By: Amie Portland, M.D.    Medications:  I have reviewed the patient's current medications. Scheduled: . aspirin  325 mg Oral Once  . aspirin  325 mg Oral Daily  . bisoprolol-hydrochlorothiazide  1 tablet Oral Daily  . diclofenac sodium  2 g Topical QID  . fluticasone  2 spray Each Nare Daily  . insulin aspart  0-20 Units Subcutaneous TID WC  . insulin aspart  0-5 Units  Subcutaneous QHS  . levothyroxine  150 mcg Oral QAC breakfast  . lidocaine  3 patch Transdermal Q24H  . meclizine  25 mg Oral QID    Assessment/Plan: 52 year old female presenting with difficulty with speech and right sided weakness.  MRI and CT unremarkable.  All symptoms have resolved other than speech.  Initial examination with multiple functional features.  Doubt cerebrovascular disease as etiology.    Recommendations: 1.  No further neurologic intervention is recommended at this time.  Patient to remain on ASA.  If further questions arise, please call or page at that time.  Thank you for allowing neurology to participate in the care of this patient.    LOS: 1 day   Thana Farr, MD [facility] [phone] 01/17/2013  9:32 AM

## 2013-01-18 ENCOUNTER — Observation Stay (HOSPITAL_COMMUNITY): Payer: BC Managed Care – PPO

## 2013-01-18 ENCOUNTER — Telehealth: Payer: Self-pay | Admitting: Internal Medicine

## 2013-01-18 DIAGNOSIS — E119 Type 2 diabetes mellitus without complications: Secondary | ICD-10-CM

## 2013-01-18 DIAGNOSIS — G8190 Hemiplegia, unspecified affecting unspecified side: Secondary | ICD-10-CM

## 2013-01-18 DIAGNOSIS — G459 Transient cerebral ischemic attack, unspecified: Secondary | ICD-10-CM

## 2013-01-18 DIAGNOSIS — R079 Chest pain, unspecified: Secondary | ICD-10-CM

## 2013-01-18 DIAGNOSIS — R4789 Other speech disturbances: Secondary | ICD-10-CM

## 2013-01-18 LAB — LIPID PANEL
Cholesterol: 169 mg/dL (ref 0–200)
HDL: 24 mg/dL — ABNORMAL LOW (ref >39)
LDL Cholesterol: 120 mg/dL — ABNORMAL HIGH (ref 0–99)
Triglycerides: 127 mg/dL (ref <150)

## 2013-01-18 LAB — GLUCOSE, CAPILLARY: Glucose-Capillary: 169 mg/dL — ABNORMAL HIGH (ref 70–99)

## 2013-01-18 MED ORDER — SIMVASTATIN 20 MG PO TABS
20.0000 mg | ORAL_TABLET | Freq: Every day | ORAL | Status: DC
  Administered 2013-01-18 – 2013-01-19 (×2): 20 mg via ORAL
  Filled 2013-01-18 (×2): qty 1

## 2013-01-18 MED ORDER — ASPIRIN 325 MG PO TABS: 325.0000 mg | ORAL_TABLET | Freq: Every day | ORAL | Status: DC

## 2013-01-18 MED ORDER — ACETAMINOPHEN 325 MG PO TABS
ORAL_TABLET | ORAL | Status: AC
  Administered 2013-01-18: 650 mg via ORAL
  Filled 2013-01-18: qty 2

## 2013-01-18 MED ORDER — REGADENOSON 0.4 MG/5ML IV SOLN
0.4000 mg | Freq: Once | INTRAVENOUS | Status: AC
  Administered 2013-01-18: 0.4 mg via INTRAVENOUS

## 2013-01-18 MED ORDER — ACETAMINOPHEN 325 MG PO TABS: 650.0000 mg | ORAL_TABLET | Freq: Once | ORAL | Status: AC

## 2013-01-18 MED ORDER — NITROGLYCERIN 0.4 MG SL SUBL: 0.4000 mg | SUBLINGUAL_TABLET | SUBLINGUAL | Status: DC | PRN

## 2013-01-18 MED ORDER — SIMVASTATIN 20 MG PO TABS: 20.0000 mg | ORAL_TABLET | Freq: Every day | ORAL | Status: DC

## 2013-01-18 MED ORDER — TECHNETIUM TC 99M SESTAMIBI GENERIC - CARDIOLITE
10.0000 | Freq: Once | INTRAVENOUS | Status: AC | PRN
  Administered 2013-01-18: 10 via INTRAVENOUS

## 2013-01-18 MED ORDER — TECHNETIUM TC 99M SESTAMIBI GENERIC - CARDIOLITE
30.0000 | Freq: Once | INTRAVENOUS | Status: AC | PRN
  Administered 2013-01-18: 30 via INTRAVENOUS

## 2013-01-18 MED ORDER — METFORMIN HCL 500 MG PO TABS: 500.0000 mg | ORAL_TABLET | Freq: Two times a day (BID) | ORAL | Status: DC

## 2013-01-18 NOTE — Progress Notes (Signed)
*  PRELIMINARY RESULTS* Echocardiogram 2D Echocardiogram has been performed.  Jeryl Columbia 01/18/2013, 12:37 PM

## 2013-01-18 NOTE — Progress Notes (Signed)
Patient ID: Alexandra [name], female   DOB: 06-25-1961, 52 y.o.   MRN: [id number] Subjective:  No chest pain or sob.  Objective:  Vital Signs in the last 24 hours: Temp:  [97.8 F (36.6 C)-98.2 F (36.8 C)] 97.8 F (36.6 C) (06/23 0500) Pulse Rate:  [70-76] 76 (06/23 0719) Resp:  [16] 16 (06/23 0500) BP: (91-130)/(47-72) 127/68 mmHg (06/23 1048) SpO2:  [97 %-98 %] 98 % (06/23 0500)  Intake/Output from previous day: 06/22 0701 - 06/23 0700 In: 1360 [P.O.:1360] Out: 750 [Urine:750] Intake/Output from this shift:    Physical Exam: Well appearing obese middle aged woman, NAD HEENT: Unremarkable Neck:  No JVD, no thyromegally Lymphatics:  No adenopathy Back:  No CVA tenderness Lungs:  Clear HEART:  Regular rate rhythm, no murmurs, no rubs, no clicks Abd:  soft, positive bowel sounds, no organomegally, no rebound, no guarding Ext:  2 plus pulses, no edema, no cyanosis, no clubbing Skin:  No rashes no nodules Neuro:  CN II through XII intact, motor grossly intact  Lab Results:  Recent Labs  01/16/13 1053 01/16/13 1104  WBC 10.2  --   HGB 13.0 13.9  PLT 369  --     Recent Labs  01/16/13 1053 01/16/13 1104  NA 137 140  K 3.5 3.5  CL 98 98  CO2 27  --   GLUCOSE 246* 246*  BUN 8 7  CREATININE 0.65 0.70    Recent Labs  01/16/13 1053 01/16/13 2205  TROPONINI <0.30 <0.30   Hepatic Function Panel  Recent Labs  01/16/13 1053  PROT 7.4  ALBUMIN 3.6  AST 20  ALT 19  ALKPHOS 89  BILITOT 0.3    Recent Labs  01/18/13 1409  CHOL 169   No results found for this basename: PROTIME,  in the last 72 hours  Imaging: Nm Myocar Multi W/spect W/wall Motion / Ef  01/18/2013   This examination was dictated by the cardiologist who supervised the examination.  Please see that report for details.  Lexiscan Myovue:  Indication: Chest Pain  The patient received .4 mg of Lexiscan as a bolus.  Resting ECG was normal and no changes with infusion Patient had mild chest pain.  HR rose to maximum of 101  BP stable at 118/72 mmHg  Images were reconstructed in the vertical horizontal and short axis planes  RAW Images: motion artifact  QPS: normal 1 Surface images normal with no RWMA;s EF 56%  Resting and stress images were normal with no infarct or ischemia  Impression:  Normal Lexiscan myovue with no ischemia or infarct EF 56%  Charlton Haws MD [facility]   Original Report Authenticated By: Judie Petit. Miles Costain, M.D.    Cardiac Studies: See above Assessment/Plan:  1. Chest pain - no evidence for ischemia. Would not work up further 2. TIA/Stroke - as per primary team. See Dr. Norris Cross note about need for monitoring. She may be a candidate for ILR with cryptogenic stroke.    LOS: 2 days    Tucker Steedley,M.D. 01/18/2013, 4:44 PM

## 2013-01-18 NOTE — Progress Notes (Addendum)
Patient ID: Alexandra [name]  female  ZOX:[id number]    DOB: 1961-03-24    DOA: 01/16/2013  PCP: [name], MD  Addendum:   Patient's husband now reported that she is stuttering again. At the time of my exam in AM, patient speech deficits appeared to have completely resolved. RN also reports intermittent stuttering during neuro checks. I d/w Dr Ferne Coe who had evaluated the patient in ED and on follow-up donot feel that patient is having TIA's and there is a functional component.  Per Dr Ferne Coe, she will have stroke service follow the patient in AM.   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 I also called psych consult for evaluation.    Zalman Hull M.D. [facility] 01/18/2013, 6:58 PM  Pager: [phone]      Assessment/Plan: Principal Problem:   TIA (transient ischemic attack) with stuttering speech, ?right side weakness. Speech deficits resolved, weakness resolved  - MRI of the negative for acute stroke.  - Continue aspirin 325mg  daily, 2-D echo results pending - discussed with neurology, no acute CVA on the MRI, possibility of functional deficits. Neuro signed off.   - LDL 120, will start statin  - reviewed Dr Lubertha Basque and Dr Norris Cross note, ?candidate for loop recorder - PT eval ordered  Active Problems:   HYPOTHYROIDISM - Continue Synthroid    Chest pain- resolved - 2 sets of cardiac enzymes negative, 2-D echo pending,  - NM stress test done today EF 56% Resting and stress images were normal with no infarct or ischemia Impression: Normal Lexiscan myoview with no ischemia or infarct     Diabetes -Continue sliding scale insulin  DVT Prophylaxis:  Code Status:  Disposition: awaiting ECHO, ? Loop recorder   Subjective: Chest pain resolved Speech deficits resolved   Objective: Weight change:   Intake/Output Summary (Last 24 hours) at 01/18/13 1714 Last data filed at 01/18/13 1200  Gross  per 24 hour  Intake    600 ml  Output      0 ml  Net    600 ml   Blood pressure 110/62, pulse 85, temperature 97.8 F (36.6 C), temperature source Oral, resp. rate 16, height 5\' 2"  (1.575 m), weight 86.8 kg (191 lb 5.8 oz), SpO2 95.00%.  Physical Exam: General: Alert and awake, oriented x3, not in any acute distress, no stuttering today CVS: S1-S2 clear, no murmur rubs or gallops Chest: CTAB Abdomen: soft nontender, nondistended, normal bowel sounds  Extremities: no cyanosis, clubbing or edema noted bilaterally Neuro: no focal neurological deficits, Strength 5/5  in upper and lower extremities bilaterally  Lab Results: Basic Metabolic Panel:  Recent Labs Lab 01/16/13 1053 01/16/13 1104  NA 137 140  K 3.5 3.5  CL 98 98  CO2 27  --   GLUCOSE 246* 246*  BUN 8 7  CREATININE 0.65 0.70  CALCIUM 8.9  --    Liver Function Tests:  Recent Labs Lab 01/16/13 1053  AST 20  ALT 19  ALKPHOS 89  BILITOT 0.3  PROT 7.4  ALBUMIN 3.6   No results found for this basename: LIPASE, AMYLASE,  in the last 168 hours No results found for this basename: AMMONIA,  in the last 168 hours CBC:  Recent Labs Lab 01/16/13 1053 01/16/13 1104  WBC 10.2  --   NEUTROABS 6.2  --   HGB 13.0 13.9  HCT 38.4 41.0  MCV 88.9  --   PLT 369  --  Cardiac Enzymes:  Recent Labs Lab 01/16/13 1053 01/16/13 2205  TROPONINI <0.30 <0.30   BNP: No components found with this basename: POCBNP,  CBG:  Recent Labs Lab 01/17/13 1126 01/17/13 1612 01/17/13 2054 01/18/13 0722 01/18/13 1315  GLUCAP 148* 196* 189* 175* 169*     Micro Results: No results found for this or any previous visit (from the past 240 hour(s)).  Studies/Results: Ct Head Wo Contrast  01/16/2013   *RADIOLOGY REPORT*  Clinical Data: Code stroke  CT HEAD WITHOUT CONTRAST  Technique:  Contiguous axial images were obtained from the base of the skull through the vertex without contrast.  Comparison: 07/03/2007  Findings: The  brain has a normal appearance without evidence for hemorrhage, infarction, hydrocephalus, or mass lesion.  There is no extra axial fluid collection.  The skull and paranasal sinuses are normal.  IMPRESSION: Negative exam.   Original Report Authenticated By: Signa Kell, M.D.   Mr Brain Wo Contrast  01/16/2013   *RADIOLOGY REPORT*  Clinical Data: Heaviness in right arm.  Aphasia.  Stroke risk factors include obesity, hypertension, diabetes mellitus, and hyperlipidemia.  MRI HEAD WITHOUT CONTRAST  Technique:  Multiplanar, multiecho pulse sequences of the brain and surrounding structures were obtained according to standard protocol without intravenous contrast.  Comparison: CT head earlier in the day.  Findings: No acute stroke or hemorrhage.  No mass lesion or hydrocephalus.  No extra-axial fluid.  There may be a tiny remote lacunar infarct left posterolateral thalamus.  Normal cerebral volume.  No significant white matter disease.  No foci of chronic hemorrhage.  The calvarium is intact.  Flow voids are maintained in the major intracranial vessels.  Pituitary and cerebellar tonsils unremarkable.  No orbital findings.  No acute sinus or mastoid disease.  IMPRESSION: Unremarkable noncontrast MRI of the head.  Specifically no evidence for restricted diffusion to suggest an acute infarct.   Original Report Authenticated By: Davonna Belling, M.D.   Mr Lumbar Spine Wo Contrast  12/20/2012   *RADIOLOGY REPORT*  Clinical Data: Low back pain extending to the right leg.  Numbness and tingling.  History of previous fusion.  MRI LUMBAR SPINE WITHOUT CONTRAST  Technique:  Multiplanar and multiecho pulse sequences of the lumbar spine were obtained without intravenous contrast.  Comparison: Report from examination 12/04/1999.  Actual films no longer available.  CT abdomen 03/27/2012  Findings: There is curvature convex to the right with the apex at L3.  T11-12:  Unremarkable interspace.  T12-L1:  Broad-based disc herniation  effaces the ventral subarachnoid space and indents the distal cord slightly.  There is mild foraminal narrowing on the right. This is largely calcified based on the previous CT.  L1-2:  Bilobed right posterolateral and left posterolateral disc herniation more prominent on the right.  This indents the thecal sac but does not appear to cause definite neural compression. This is largely calcified based on the previous CT.  The conus tip is at upper L2.  L2-3:  Bulging of the disc more towards the left.  No definite neural compression.  L3-4:  Bulging of the disc.  Bilateral facet degeneration.  No apparent neural compression.  L4-5:  Previous posterior decompression.  L4 is fused 9 mm anterior relative to L5.  There is mild chronic foraminal narrowing without definite compression of the exiting L4 nerve roots.  L5-S1:  Posterior decompression and fusion.  Wide patency of the canal and foramina.  IMPRESSION: There has been decompression and fusion from L4 to the sacrum.  L4  is fused 9 mm anterior relative to L5.  There is sufficient patency of the central canal through the fusion region.  There is foraminal narrowing at the L4-5 level, but this is obviously chronic.  L3-4 shows adjacent segment facet arthropathy but there is no slippage.  There is no disc pathology.  There is no stenosis or neural compression.  T12-L1:  Chronic calcified broad-based disc herniation indents the thecal sac but does not cause definite neural compression.  L1-2:  Chronic bilobed disc herniation more prominent towards the right which is also calcified.  Similarly, this indents the thecal sac but does not appear to cause definite neural compression.   Original Report Authenticated By: Paulina Fusi, M.D.   Dg Epidurography  01/05/2013   *RADIOLOGY REPORT*  CLINICAL DATA:  Lumbosacral spondylosis without myelopathy.  Right S1 radiculitis.  Displacement of the L4-5 lumbar disc associated with anterolisthesis.  LUMBAR EPIDURAL INJECTION: An  interlaminar approach was performed on the right at L5-S1.  The overlying skin was cleansed and anesthetized.  A 20 gauge spinal needle was advanced using loss-of-resistance technique. Significant facet arthropathy and posterior osseous overgrowth is evident at L5- S1.  A more midline approach was taken.  Injection of 2cc of Omnipaque 180 confirmed epidural placement.  There was no evidence for intravascular or intrathecal spread of contrast.  I then injected 120 mg of Depo-Medrol and 3ml of 1% lidocaine. Following the injection, the patient had some parasthesias in both lower extremities, suggesting some degree of intrathecal lidocaine. She was observed in the the nurses station while the parasthesias resolved and discharged in stable neurologic condition.  FLUORO TIME:  2 minutes 34 seconds  IMPRESSIONS:  Technically successful first interlaminar epidural steroid injection on the right at L5-S1.  Given the osseous restrictions at L5-S1, a caudal approach for epidural injections may be more useful subsequently.   Original Report Authenticated By: Marin Roberts, M.D.   Dg Chest Portable 1 View  01/16/2013   *RADIOLOGY REPORT*  Clinical Data: Chest pressure.  Hypertension.  PORTABLE CHEST - 1 VIEW  Comparison: 03/03/2009  Findings: The heart, mediastinum and hila are unremarkable.  The lungs are clear allowing for the semi-erect positioning and significant overlying soft tissues.  No pleural effusion or pneumothorax.  The bony thorax is intact.  IMPRESSION: No active disease of the chest.   Original Report Authenticated By: Amie Portland, M.D.    Medications: Scheduled Meds: . aspirin  325 mg Oral Daily  . diclofenac sodium  2 g Topical QID  . fluticasone  2 spray Each Nare Daily  . insulin aspart  0-20 Units Subcutaneous TID WC  . insulin aspart  0-5 Units Subcutaneous QHS  . levothyroxine  150 mcg Oral QAC breakfast  . lidocaine  3 patch Transdermal Q24H  . meclizine  25 mg Oral QID       LOS: 2 days   Alexandra [name] M.D. [facility] 01/18/2013, 5:14 PM Pager: [phone]  If 7PM-7AM, please contact night-coverage [email or web address] Password [email or web address]

## 2013-01-18 NOTE — Telephone Encounter (Signed)
Caller: Dasha/Patient; Phone: [phone]; Reason for Call: Pt calling to ask who needs to fill out her FMLA papers whiel she is hospitalized for stroke lke sx.  In the hosp now.  Advised that the MD in charge of her plan of care there at the hospital would need to help her with that.  Advised her to ask her RN when next she's in the room and they should be able to facilitate that for her.  No other questions.

## 2013-01-18 NOTE — Progress Notes (Signed)
Patient Name: Alexandra [name] Date of Encounter: 01/18/2013  Principal Problem:   TIA (transient ischemic attack) Active Problems:   HYPOTHYROIDISM   Hemiplegia, unspecified, affecting dominant side   Chest pain   Slurred speech   Diabetes    SUBJECTIVE: C/O 2/10 chest pain, worse with palpation of lower left sternal border. No SOB. Still with stuttering speech and some difficulty word-finding  OBJECTIVE Filed Vitals:   01/17/13 2100 01/17/13 2137 01/18/13 0500 01/18/13 0719  BP: 91/53 102/54 92/47 101/65  Pulse: 75  70 76  Temp: 98.2 F (36.8 C)  97.8 F (36.6 C)   TempSrc:      Resp: 16  16   Height:      Weight:      SpO2: 97%  98%     Intake/Output Summary (Last 24 hours) at 01/18/13 1039 Last data filed at 01/17/13 2100  Gross per 24 hour  Intake   1000 ml  Output    750 ml  Net    250 ml   Filed Weights   01/16/13 1410  Weight: 191 lb 5.8 oz (86.8 kg)    PHYSICAL EXAM General: Well developed, well nourished, female in no acute distress. Head: Normocephalic, atraumatic.  Neck: Supple without bruits, JVD not elevated. Lungs:  Resp regular and unlabored, CTA bilaterally. Heart: RRR, S1, S2, no S3, S4, or murmur; no rub. Abdomen: Soft, non-tender, non-distended, BS + x 4.  Extremities: No clubbing, cyanosis, no edema.  Neuro: Alert and oriented X 3. Moves all extremities spontaneously. Psych: Normal affect.  LABS: CBC: Recent Labs  01/16/13 1053 01/16/13 1104  WBC 10.2  --   NEUTROABS 6.2  --   HGB 13.0 13.9  HCT 38.4 41.0  MCV 88.9  --   PLT 369  --    INR: Recent Labs  01/16/13 1053  INR 0.87   Basic Metabolic Panel: Recent Labs  01/16/13 1053 01/16/13 1104  NA 137 140  K 3.5 3.5  CL 98 98  CO2 27  --   GLUCOSE 246* 246*  BUN 8 7  CREATININE 0.65 0.70  CALCIUM 8.9  --    Liver Function Tests: Recent Labs  01/16/13 1053  AST 20  ALT 19  ALKPHOS 89  BILITOT 0.3  PROT 7.4  ALBUMIN 3.6   Cardiac Enzymes: Recent  Labs  01/16/13 1053 01/16/13 2205  TROPONINI <0.30 <0.30    Recent Labs  01/16/13 1113  TROPIPOC 0.04   Hemoglobin A1C: Recent Labs  01/16/13 2106  HGBA1C 7.8*   Thyroid Function Tests: Recent Labs  01/16/13 2106  TSH 2.533   TELE:   SR     ECG: SR, no acute ischemic changes  Radiology/Studies: Ct Head Wo Contrast  01/16/2013   *RADIOLOGY REPORT*  Clinical Data: Code stroke  CT HEAD WITHOUT CONTRAST  Technique:  Contiguous axial images were obtained from the base of the skull through the vertex without contrast.  Comparison: 07/03/2007  Findings: The brain has a normal appearance without evidence for hemorrhage, infarction, hydrocephalus, or mass lesion.  There is no extra axial fluid collection.  The skull and paranasal sinuses are normal.  IMPRESSION: Negative exam.   Original Report Authenticated By: Signa Kell, M.D.   Mr Brain Wo Contrast  01/16/2013   *RADIOLOGY REPORT*  Clinical Data: Heaviness in right arm.  Aphasia.  Stroke risk factors include obesity, hypertension, diabetes mellitus, and hyperlipidemia.  MRI HEAD WITHOUT CONTRAST  Technique:  Multiplanar, multiecho pulse sequences  of the brain and surrounding structures were obtained according to standard protocol without intravenous contrast.  Comparison: CT head earlier in the day.  Findings: No acute stroke or hemorrhage.  No mass lesion or hydrocephalus.  No extra-axial fluid.  There may be a tiny remote lacunar infarct left posterolateral thalamus.  Normal cerebral volume.  No significant white matter disease.  No foci of chronic hemorrhage.  The calvarium is intact.  Flow voids are maintained in the major intracranial vessels.  Pituitary and cerebellar tonsils unremarkable.  No orbital findings.  No acute sinus or mastoid disease.  IMPRESSION: Unremarkable noncontrast MRI of the head.  Specifically no evidence for restricted diffusion to suggest an acute infarct.   Original Report Authenticated By: Davonna Belling,  M.D.   Dg Chest Portable 1 View  01/16/2013   *RADIOLOGY REPORT*  Clinical Data: Chest pressure.  Hypertension.  PORTABLE CHEST - 1 VIEW  Comparison: 03/03/2009  Findings: The heart, mediastinum and hila are unremarkable.  The lungs are clear allowing for the semi-erect positioning and significant overlying soft tissues.  No pleural effusion or pneumothorax.  The bony thorax is intact.  IMPRESSION: No active disease of the chest.   Original Report Authenticated By: Amie Portland, M.D.     Current Medications:  . aspirin  325 mg Oral Daily  . diclofenac sodium  2 g Topical QID  . fluticasone  2 spray Each Nare Daily  . insulin aspart  0-20 Units Subcutaneous TID WC  . insulin aspart  0-5 Units Subcutaneous QHS  . levothyroxine  150 mcg Oral QAC breakfast  . lidocaine  3 patch Transdermal Q24H  . meclizine  25 mg Oral QID      ASSESSMENT AND PLAN:  Chest pain - atypical, occurs without exertion and has chest wall tenderness. Enzymes are negative for MI. Will do Lexiscan to assess for ischemia. With recent neuro event, cath only if high-risk. Otherwise, continue current Rx and f/u in office. She saw DB in 2007, no one since.   Otherwise, per IM (Neuro signed off) Principal Problem:   TIA (transient ischemic attack) Active Problems:   HYPOTHYROIDISM   Hemiplegia, unspecified, affecting dominant side   Slurred speech   Diabetes   Melida Quitter , PA-C 10:39 AM 01/18/2013

## 2013-01-18 NOTE — Discharge Summary (Signed)
Physician Discharge Summary  Patient ID: Alexandra [name] MRN: [id number] DOB/AGE: 53-Feb-1962 53 y.o.  Admit date: 01/16/2013 Discharge date: 01/19/2013  Primary Care Physician:  [name], MD  Discharge Diagnoses:     . 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 HYPOTHYROIDISM . Chest pain- resolved  . Diabetes Mellitus  . Hyperlipidemia  Consults:  Cardiology                    Neurology- stroke service                    Psychiatry- Dr Magdalen Spatz  Recommendations for outpatient followup  Please arrange psychiatry followup out-patient  Allergies:   Allergies  Allergen Reactions  . Shrimp Flavor Swelling    Swelling of tongue and rash  . Ace Inhibitors Cough  . Oxycodone-Acetaminophen Rash     Discharge Medications:   Medication List    TAKE these medications       aspirin 325 MG tablet  Take 1 tablet (325 mg total) by mouth daily.     bisoprolol-hydrochlorothiazide 5-6.25 MG per tablet  Commonly known as:  ZIAC  Take 1 tablet by mouth daily.     clonazePAM 0.5 MG tablet  Commonly known as:  KLONOPIN  Take 1 tablet (0.5 mg total) by mouth 2 (two) times daily.     diclofenac sodium 1 % Gel  Commonly known as:  VOLTAREN  Apply 1 application topically 4 (four) times daily.     escitalopram 10 MG tablet  Commonly known as:  LEXAPRO  Take 1 tablet (10 mg total) by mouth daily.     fluticasone 50 MCG/ACT nasal spray  Commonly known as:  FLONASE  Place 2 sprays into the nose daily.     HYDROcodone-acetaminophen 5-325 MG per tablet  Commonly known as:  NORCO/VICODIN  Take 1-2 tablets by mouth every 6 (six) hours as needed for pain.     levothyroxine 150 MCG tablet  Commonly known as:  SYNTHROID, LEVOTHROID  TAKE 1 TABLET EVERY DAY     lidocaine 5 %  Commonly known as:  LIDODERM  Place 3 patches onto the skin daily. Remove & Discard patch within 12 hours or as directed by MD     meclizine 25 MG tablet   Commonly known as:  ANTIVERT  Take 1 tablet (25 mg total) by mouth 4 (four) times daily.     metFORMIN 500 MG tablet  Commonly known as:  GLUCOPHAGE  Take 1 tablet (500 mg total) by mouth 2 (two) times daily with a meal.     nitroGLYCERIN 0.4 MG SL tablet  Commonly known as:  NITROSTAT  Place 1 tablet (0.4 mg total) under the tongue every 5 (five) minutes as needed for chest pain (CP or SOB).     simvastatin 20 MG tablet  Commonly known as:  ZOCOR  Take 1 tablet (20 mg total) by mouth at bedtime.     UNABLE TO FIND  Outpatient speech therapy      Diagnosis: Speech deficits due to unknown etiology         Brief H and P: For complete details please refer to admission H and P, but in brief Alexandra [name] is a 52 y.o. BF PMHX hypertension, hypothyroidism DM, HLD, OSA (prior to gastric bypass) She works at a call center and at 10 AM this morning, she was found to be acutely altered and unable to speak. She  also was noted to have left-sided weakness and facial droop by EMS. Upon arrival to ED patient continued to have signs and symptoms consistent with TIA versus CVA (slurred speech, left-sided upper and lower extremity weakness). Head MRI was obtained and was within normal limits. Patient also complained of chest pain rated at 4/10, positive radiation to right jawline and arm, positive diaphoresis, negative shortness of breath, described pain as a person sitting on her chest. Treated with sublingual nitroglycerin x2 and pain decreased to 2/10. Patient stated has had previous episodes of chest pain brought on by exertion, resolved with rest. Currently chest pain 3/10; treated with sublingual nitroglycerin and chest pain decreased to 1/10   Hospital Course:  Non-organic stress reaction with stuttering speech, ?right side weakness. Weakness has resolved with intermittent speech deficits. Patient initially presented as code-stroke. CT head was unremarkable.  MRI was performed and was  negative for acute stroke. Patient was seen and followed by Neurology, Dr Thad Ranger who also questioned if her symptoms were functional in nature. Patient was placed on aspirin 325mg  daily. Lipid Panel showed LDL 120, hence patient was started on simvastatin.  Reviewed Dr Lubertha Basque and Dr Norris Cross note, ?candidate for loop recorder, patient will follow Dr Ladona Ridgel outpatient in 2 weeks 2-D echo showed EF of 60-65% with normal wall motion.  Due to patient's intermittent stuttering, stroke service was recalled, deemed as  organic stress reaction with no TIA or any stroke. Psychiatry consultation was obtained, patient was seen by Dr. Elsie Saas. Psychiatry also felt the patient was having depression and stress reaction. She was recommended Lexapro and Klonopin. She needs to have a psychiatry followup outpatient.   HYPOTHYROIDISM  - Continue Synthroid   Chest pain- resolved  -  cardiac enzymes negative, 2-D echo showed EF of 60-65% with normal wall motion - NM stress test done today EF 56% Resting and stress images were normal with no infarct or ischemia     Day of Discharge BP 114/78  Pulse 75  Temp(Src) 97.8 F (36.6 C) (Oral)  Resp 18  Ht 5\' 2"  (1.575 m)  Wt 86.8 kg (191 lb 5.8 oz)  BMI 34.99 kg/m2  SpO2 93%  Physical Exam: General: Alert and awake oriented x3 not in any acute distress. HEENT: anicteric sclera, pupils reactive to light and accommodation CVS: S1-S2 clear no murmur rubs or gallops Chest: clear to auscultation bilaterally, no wheezing rales or rhonchi Abdomen: soft nontender, nondistended, normal bowel sounds, no organomegaly Extremities: no cyanosis, clubbing or edema noted bilaterally Neuro: Cranial nerves II-XII intact, no focal neurological deficits   The results of significant diagnostics from this hospitalization (including imaging, microbiology, ancillary and laboratory) are listed below for reference.    LAB RESULTS: Basic Metabolic Panel:  Recent  Labs Lab 01/16/13 1053 01/16/13 1104  NA 137 140  K 3.5 3.5  CL 98 98  CO2 27  --   GLUCOSE 246* 246*  BUN 8 7  CREATININE 0.65 0.70  CALCIUM 8.9  --    Liver Function Tests:  Recent Labs Lab 01/16/13 1053  AST 20  ALT 19  ALKPHOS 89  BILITOT 0.3  PROT 7.4  ALBUMIN 3.6   No results found for this basename: LIPASE, AMYLASE,  in the last 168 hours No results found for this basename: AMMONIA,  in the last 168 hours CBC:  Recent Labs Lab 01/16/13 1053 01/16/13 1104  WBC 10.2  --   NEUTROABS 6.2  --   HGB 13.0 13.9  HCT 38.4 41.0  MCV 88.9  --   PLT 369  --    Cardiac Enzymes:  Recent Labs Lab 01/16/13 1053 01/16/13 2205  TROPONINI <0.30 <0.30   BNP: No components found with this basename: POCBNP,  CBG:  Recent Labs Lab 01/19/13 1132 01/19/13 1631  GLUCAP 188* 115*    Significant Diagnostic Studies:  Ct Head Wo Contrast  01/16/2013   *RADIOLOGY REPORT*  Clinical Data: Code stroke  CT HEAD WITHOUT CONTRAST  Technique:  Contiguous axial images were obtained from the base of the skull through the vertex without contrast.  Comparison: 07/03/2007  Findings: The brain has a normal appearance without evidence for hemorrhage, infarction, hydrocephalus, or mass lesion.  There is no extra axial fluid collection.  The skull and paranasal sinuses are normal.  IMPRESSION: Negative exam.   Original Report Authenticated By: Signa Kell, M.D.   Mr Brain Wo Contrast  01/16/2013   *RADIOLOGY REPORT*  Clinical Data: Heaviness in right arm.  Aphasia.  Stroke risk factors include obesity, hypertension, diabetes mellitus, and hyperlipidemia.  MRI HEAD WITHOUT CONTRAST  Technique:  Multiplanar, multiecho pulse sequences of the brain and surrounding structures were obtained according to standard protocol without intravenous contrast.  Comparison: CT head earlier in the day.  Findings: No acute stroke or hemorrhage.  No mass lesion or hydrocephalus.  No extra-axial fluid.  There  may be a tiny remote lacunar infarct left posterolateral thalamus.  Normal cerebral volume.  No significant white matter disease.  No foci of chronic hemorrhage.  The calvarium is intact.  Flow voids are maintained in the major intracranial vessels.  Pituitary and cerebellar tonsils unremarkable.  No orbital findings.  No acute sinus or mastoid disease.  IMPRESSION: Unremarkable noncontrast MRI of the head.  Specifically no evidence for restricted diffusion to suggest an acute infarct.   Original Report Authenticated By: Davonna Belling, M.D.   Dg Chest Portable 1 View  01/16/2013   *RADIOLOGY REPORT*  Clinical Data: Chest pressure.  Hypertension.  PORTABLE CHEST - 1 VIEW  Comparison: 03/03/2009  Findings: The heart, mediastinum and hila are unremarkable.  The lungs are clear allowing for the semi-erect positioning and significant overlying soft tissues.  No pleural effusion or pneumothorax.  The bony thorax is intact.  IMPRESSION: No active disease of the chest.   Original Report Authenticated By: Amie Portland, M.D.    2D ECHO: Study Conclusions  - Left ventricle: The cavity size was normal. Wall thickness was upper normal. Systolic function was normal. The estimated ejection fraction was in the range of 60% to 65%. Wall motion was normal; there were no regional wall motion abnormalities. - Left atrium: The atrium was mildly dilated.    Disposition and Follow-up: Discharge Orders   Future Appointments Provider Department Dept Phone   01/22/2013 4:15 PM Baker Pierini, FNP [facility] [phone]   02/10/2013 8:00 AM [facility] [place] [phone]   02/17/2013 8:15 AM Baker Pierini, FNP [facility] [place] [phone]   Future Orders Complete By Expires     Diet Carb Modified  As directed     Increase activity slowly  As directed         DISPOSITION: Home DIET: carb modified diet ACTIVITY: as tolerated  DISCHARGE  FOLLOW-UP Follow-up Information   Follow up with [name], MD. Schedule an appointment as soon as possible for a visit in 2 weeks.   Contact information:   [address] [place]ena Flake [place] [address] [phone]  Follow up with Lewayne Bunting, MD. Schedule an appointment as soon as possible for a visit in 2 weeks. (for hospital follow-up )    Contact information:   [address] [place] [address] [phone]       Time spent on Discharge: 33 mins  Signed:   RAI,RIPUDEEP M.D. [facility] 01/19/2013, 6:21 PM Pager: [phone]

## 2013-01-18 NOTE — Progress Notes (Signed)
Lexiscan Cardiolite performed.  MD to review ECGs.

## 2013-01-19 DIAGNOSIS — IMO0001 Reserved for inherently not codable concepts without codable children: Secondary | ICD-10-CM

## 2013-01-19 LAB — GLUCOSE, CAPILLARY
Glucose-Capillary: 157 mg/dL — ABNORMAL HIGH (ref 70–99)
Glucose-Capillary: 115 mg/dL — ABNORMAL HIGH (ref 70–99)

## 2013-01-19 MED ORDER — SIMVASTATIN 20 MG PO TABS: 20.0000 mg | ORAL_TABLET | Freq: Every day | ORAL | Status: DC

## 2013-01-19 MED ORDER — CLONAZEPAM 0.5 MG PO TABS: 0.5000 mg | ORAL_TABLET | Freq: Two times a day (BID) | ORAL | Status: DC

## 2013-01-19 MED ORDER — ESCITALOPRAM OXALATE 10 MG PO TABS: 10.0000 mg | ORAL_TABLET | Freq: Every day | ORAL | Status: DC

## 2013-01-19 MED ORDER — UNABLE TO FIND: Status: DC

## 2013-01-19 MED ORDER — ESCITALOPRAM OXALATE 10 MG PO TABS
10.0000 mg | ORAL_TABLET | Freq: Every day | ORAL | Status: DC
Start: 2013-01-19 — End: 2013-01-19
  Administered 2013-01-19: 10 mg via ORAL
  Filled 2013-01-19: qty 1

## 2013-01-19 NOTE — Evaluation (Signed)
Occupational Therapy Evaluation Patient Details Name: Alexandra [name] MRN: [id number] DOB: 1960-08-17 Today's Date: 01/19/2013 Time: 1340-1350 OT Time Calculation (min): 10 min  OT Assessment / Plan / Recommendation Clinical Impression  52 y.o. female admitted to [facility] for Difficulty talking and right sided weakness.  MRI and CT are unremarkable.  She continues to have speech difficulties with stuttering throughout her session.  She reports that her right leg is weak at baseline due to h/o back problems and pinched nerves.  She reports her performance with ADLs is also at baseline.  No further actue or f/u OT needs at this time.      OT Assessment  Patient does not need any further OT services    Follow Up Recommendations  No OT follow up    Barriers to Discharge      Equipment Recommendations       Recommendations for Other Services    Frequency       Precautions / Restrictions Precautions Precautions: None Restrictions Weight Bearing Restrictions: No   Pertinent Vitals/Pain No c/o pain    ADL  Grooming: Performed;Wash/dry hands;Independent Where Assessed - Grooming: Unsupported standing Lower Body Dressing: Independent;Performed Where Assessed - Lower Body Dressing: Unsupported sit to stand Toilet Transfer: Performed;Independent Toilet Transfer Method: Sit to stand;Stand pivot Acupuncturist: Regular height toilet Toileting - Clothing Manipulation and Hygiene: Performed;Independent Tub/Shower Transfer: Simulated;Modified independent Tub/Shower Transfer Method: Stand pivot Transfers/Ambulation Related to ADLs: Pt presents at her baseline with all aspects of functional mobility and ADL performance. Pt does c/o weakness in right Le but that is her PLOF. Does not demonstrate foot drop.     OT Diagnosis:    OT Problem List:   OT Treatment Interventions:     OT Goals    Visit Information  Last OT Received On: 01/19/13    Subjective Data  Subjective: Im  feeling pretty good  Patient Stated Goal: to d/c home   Prior Functioning     Home Living Lives With: Spouse;Family Available Help at Discharge: Family;Available PRN/intermittently Type of Home: House Home Access: Level entry Home Layout: Two level Alternate Level Stairs-Number of Steps: 13 Alternate Level Stairs-Rails: Right;Left;Can reach both Bathroom Shower/Tub: Tub/shower unit;Curtain Firefighter: Standard Home Adaptive Equipment: None Additional Comments: h/o spinal fusion and right foot plantar faciitis surgery  Prior Function Level of Independence: Independent Vocation: Full time employment Dominant Hand: Right         Vision/Perception Vision - History Baseline Vision: No visual deficits Patient Visual Report: No change from baseline Vision - Assessment Eye Alignment: Within Functional Limits Perception Perception: Within Functional Limits Praxis Praxis: Intact   Cognition  Cognition Arousal/Alertness: Awake/alert Behavior During Therapy: WFL for tasks assessed/performed Overall Cognitive Status: Within Functional Limits for tasks assessed    Extremity/Trunk Assessment Right Upper Extremity Assessment RUE ROM/Strength/Tone: Within functional levels RUE Sensation: WFL - Light Touch RUE Coordination: WFL - gross/fine motor Left Upper Extremity Assessment LUE ROM/Strength/Tone: Within functional levels LUE Sensation: WFL - Light Touch LUE Coordination: WFL - gross/fine motor Right Lower Extremity Assessment RLE ROM/Strength/Tone Deficits: per pt report she has a long h/o back problems affecting her right leg.  She states that right leg strength is at baseline for her.  MMT with 3=?5 strength right leg throughout RLE Sensation Deficits: deficits at baseline per pt with h/o back problems.   Left Lower Extremity Assessment LLE ROM/Strength/Tone: Within functional levels     Mobility Bed Mobility Bed Mobility: Supine to Sit Supine to Sit:  7:  Independent Transfers Sit to Stand: 6: Modified independent (Device/Increase time) Stand to Sit: 6: Modified independent (Device/Increase time)     Exercise     Balance Balance Balance Assessed: Yes Dynamic Standing Balance Dynamic Standing - Balance Support: No upper extremity supported Dynamic Standing - Level of Assistance: 6: Modified independent (Device/Increase time) High Level Balance High Level Balance Activites: Direction changes;Turns;Head turns;Sudden stops High Level Balance Comments: demonstrates at mod I level with extra time.   End of Session OT - End of Session Activity Tolerance: Patient tolerated treatment well Patient left: in bed;with call bell/phone within reach;with family/visitor present  GO     Roney Mans [facility] 01/19/2013, 2:12 PM

## 2013-01-19 NOTE — Evaluation (Signed)
Physical Therapy One Time Evaluation/Discharge Patient Details Name: Alexandra [name] MRN: [id number] DOB: 09-27-1960 Today's Date: 01/19/2013 Time: 4098-1191 PT Time Calculation (min): 20 min  PT Assessment / Plan / Recommendation Clinical Impression  52 y.o. female admitted to [facility] for Difficulty talking and right sided weakness.  MRI and CT are unremarkable.  She continues to have speech difficulties with stuttering throughout her session.  She reports that her right leg is weak at baseline due to h/o back problems and pinched nerves.  She reports her gait pattern is also at baseline.  She declined pursuing outpatient therapy for chronic back issues and right leg weakness.  No further actue or f/u PT needs at this time.      PT Assessment  Patent does not need any further PT services    Follow Up Recommendations  No PT follow up    Does the patient have the potential to tolerate intense rehabilitation     NA  Barriers to Discharge   none      Equipment Recommendations  None recommended by PT    Recommendations for Other Services   none        Pertinent Vitals/Pain See vitals flow sheet.       Mobility  Transfers Transfers: Sit to Stand;Stand to Sit Sit to Stand: 6: Modified independent (Device/Increase time);With upper extremity assist;From bed Stand to Sit: 6: Modified independent (Device/Increase time);Without upper extremity assist;To bed Details for Transfer Assistance: relies on hands, but no external assist needed.  In general requires extra time as well.  Ambulation/Gait Ambulation/Gait Assistance: 6: Modified independent (Device/Increase time) Ambulation Distance (Feet): 300 Feet Assistive device: None Ambulation/Gait Assistance Details: slow, requiring extra time due to abnormal gait pattern, but again, pt reports this is her baseline.   Gait Pattern: Step-through pattern;Lateral hip instability Modified Rankin (Stroke Patients Only) Pre-Morbid Rankin Score:  No significant disability Modified Rankin: Slight disability        Visit Information  Last PT Received On: 01/19/13 Assistance Needed: +1    Subjective Data  Subjective: Pt reports her leg weakness is from back issues and she is at baseline Patient Stated Goal: to go home today   Prior Functioning  Home Living Lives With: Spouse;Family (children 20, 27, 3) Available Help at Discharge: Family;Available PRN/intermittently Type of Home: House Home Access: Level entry Home Layout: Two level Alternate Level Stairs-Number of Steps: 13 Alternate Level Stairs-Rails: Right;Left;Can reach both Bathroom Shower/Tub: Tub/shower unit;Curtain Firefighter: Standard Home Adaptive Equipment: None Additional Comments: h/o spinal fusion and right foot plantar faciitis surgery  Prior Function Level of Independence: Independent Able to Take Stairs?: Yes Driving: Yes 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  She states that right leg strength is at baseline for her.  MMT with 3=?5 strength right leg throughout RLE Sensation: Deficits RLE Sensation Deficits: deficits at baseline per pt with h/o back problems.   Left Lower Extremity Assessment LLE ROM/Strength/Tone: Within functional levels   Balance Balance Balance Assessed: Yes Dynamic Standing Balance Dynamic Standing - Balance Support: No upper extremity supported Dynamic  Standing -  Level of Assistance: 6: Modified independent (Device/Increase time) Dynamic Standing - Balance Activities: Other (comment) (stairs and gait) Dynamic Standing - Comments: needs extra time to be safe, but able to do it with upper extremity assist.    End of Session PT - End of Session Activity Tolerance: Patient tolerated treatment well Patient left: in bed;Other (comment) (seated EOB) Nurse Communication: Mobility status (to RN tech)  GP Functional Assessment Tool Used: clinical experience Functional Limitation: Mobility: Walking and moving around Mobility: Walking and Moving Around Current Status [phone]): 0 percent impaired, limited or restricted Mobility: Walking and Moving Around Goal Status (U0454): 0 percent impaired, limited or restricted Mobility: Walking and Moving Around Discharge Status [phone]): 0 percent impaired, limited or restricted   Philander Ake B. Darnell Jeschke, PT, DPT [phone]   01/19/2013, 9:46 AM

## 2013-01-19 NOTE — Evaluation (Signed)
Speech Language Pathology Evaluation Patient Details Name: Alexandra [name] MRN: [id number] DOB: 1961/04/17 Today's Date: 01/19/2013 Time: 7829-5621 SLP Time Calculation (min): 20 min  Problem List:  Patient Active Problem List   Diagnosis Date Noted  . Hemiplegia, unspecified, affecting dominant side 01/16/2013  . Chest pain 01/16/2013  . Slurred speech 01/16/2013  . TIA (transient ischemic attack) 01/16/2013  . Diabetes 01/16/2013  . NEUROSIS,NOS 07/18/2009  . NECK PAIN 07/18/2009  . GASTRITIS 02/16/2009  . BARIATRIC SURGERY STATUS 06/01/2008  . HIDRADENITIS SUPPURATIVA 10/28/2007  . HYPERLIPIDEMIA 06/16/2007  . OTHER SPECIFIED ANEMIAS 06/16/2007  . HIP PAIN, RIGHT 06/16/2007  . HYPOTHYROIDISM 02/16/2007   Past Medical History:  Past Medical History  Diagnosis Date  . GERD (gastroesophageal reflux disease)     prn med.  . Hypothyroidism   . Hypertension     new dx. - will start med. 06/28/2011  . Sleep apnea     prior to gastric bypass - no sleep study since bypass  . Rotator cuff tear, right   . Diabetes mellitus   . Hyperlipidemia   . Hiatal hernia 1998  . Hyperplastic colon polyp   . Fatty liver   . Hyperthyroidism    Past Surgical History:  Past Surgical History  Procedure Laterality Date  . Cesarean section  1991  . Cholecystectomy  1989  . Shoulder arthroscopy distal clavicle excision and open rotator cuff repair  07/26/2010    right  . Shoulder arthroscopy  01/02/2006; 05/31/2004    left 2007; right 2005  . Exploratory laparotomy  05/08/2000    right salpingo-oophorectomy  . Abdominal hysterectomy  06/25/2000    left salpingo-oophorectomy  . 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 Laminotomy / excision disk posterior cervical spine  08/10/2009    C7-T1  . Thyroid surgery  early 2000s    goiter  . Cardiac catheterization  04/01/2006  . Shoulder arthroscopy  07/02/2011    Procedure: ARTHROSCOPY SHOULDER;  Surgeon: Wyn Forster., MD;  Location: [facility];  Service: Orthopedics;  Laterality: Right;  repair supraspinatus, Debride glenohumeral joint, labrum  . Back surgery  1997    discetomy, lumbar spinal fusion  . Tubal ligation     HPI:  52 y.o. Alexandra [name] PMHX hypertension, hypothyroidism DM, HLD, OSA (prior to gastric bypass). She works at a call center and at 10 AM this morning, she was found to be acutely altered and unable to speak. She also was noted to have left-sided weakness and facial droop by EMS. MRI was obtained and was within normal limits. Patient also complained of chest pain rated at 4/10Treated with sublingual nitroglycerin x2 and pain decreased to 2/10. Patient states has had previous episodes of chest pain brought on by exertion, resolved with rest.    Assessment / Plan / Recommendation Clinical Impression  Pt. assessed to have functional language and cognitive abilities.  Verbal expression consists of dysfluent speech characterized by part word and initial phoneme repetition.  No overt prolongations of initial sounds present.  Pt.'s with dysfluency typically do not stutter while singing and pt. was able to sing two songs fluently without any phoneme or word repetition.  Pt.'s dysfluency is present whether etiology is psychogenic.  Pt. stated she feels it has improved since yestersday.  This SLP encouraged her that it should continue to improve and to stop when dysfluency occurs and begin again with easy onset.  Do not recommend further ST at this time, however if dysfluency continues  in 7-10 days, would recommend outpatient ST.        SLP Assessment  Patient does not need any further Speech Lanaguage Pathology Services    Follow Up Recommendations   (outpatient if dysfluency continues at 7-10 days out.)    Frequency and Duration        Pertinent Vitals/Pain none       SLP Evaluation Prior Functioning  Cognitive/Linguistic Baseline: Within functional limits Type of Home: House Lives With:  Spouse;Family Available Help at Discharge: Family;Available PRN/intermittently Vocation: Full time employment   Cognition  Overall Cognitive Status: Within Functional Limits for tasks assessed    Comprehension  Auditory Comprehension Overall Auditory Comprehension: Appears within functional limits for tasks assessed Visual Recognition/Discrimination Discrimination: Not tested Reading Comprehension Reading Status: Not tested    Expression Expression Primary Mode of Expression: Verbal Verbal Expression Overall Verbal Expression: Appears within functional limits for tasks assessed Initiation: No impairment Level of Generative/Spontaneous Verbalization: Conversation Repetition: No impairment Naming: Not tested Pragmatics: No impairment Written Expression Dominant Hand: Right Written Expression: Not tested   Oral / Motor Oral Motor/Sensory Function Overall Oral Motor/Sensory Function: Appears within functional limits for tasks assessed Motor Speech Overall Motor Speech: Impaired Respiration: Within functional limits Phonation: Normal Resonance: Within functional limits Articulation: Within functional limitis Intelligibility: Intelligible Motor Planning:  (dysfluent)        Royce Macadamia M.Ed [employer] [phone]  01/19/2013

## 2013-01-19 NOTE — Progress Notes (Signed)
Discharge Note: Pt is alert and oriented, VS are stable, denies CP.  Telemetry & IV discontinued.  Discharge instructions reviewed with patient, pt verbalizes understanding.  Wheelchair transportation provided, all belongings with patient  

## 2013-01-19 NOTE — Progress Notes (Signed)
Stroke Team Progress Note  HISTORY Alexandra [name] is an 52 y.o. female who was at work today 01/16/2013 and began to complain of chest pain. EMS was called and when they arrived the patient was hypertensive and had right sided weakness on their examination. Patient was brought in at that time as a code stroke.  Patient was not a TPA candidate secondary to Improving symptoms, not felt to be a stroke . She was admitted for further evaluation and treatment.  SUBJECTIVE No family is at the bedside.  Overall she feels her condition is stable. Patient has psychosocial stressors, including recently being award custody of 60 year old granddaughter in Nov 2013, due to daughter with post partum depression, who was suicidal/homicidal. Patient feels stable. Some stuttering continues, which doesn't bother patient, but does bother her husband.  OBJECTIVE Most recent Vital Signs: Filed Vitals:   01/18/13 1500 01/18/13 2114 01/19/13 0608 01/19/13 0650  BP: 110/62 99/59 88/45  105/72  Pulse: 85 82 79   Temp: 97.8 F (36.6 C) 98.2 F (36.8 C) 98 F (36.7 C)   TempSrc: Oral Oral Oral   Resp: 16 18 18    Height:      Weight:      SpO2: 95% 97% 99%    CBG (last 3)   Recent Labs  01/18/13 1643 01/18/13 2112 01/19/13 0707  GLUCAP 208* 184* 157*    IV Fluid Intake:     MEDICATIONS  . aspirin  325 mg Oral Daily  . diclofenac sodium  2 g Topical QID  . fluticasone  2 spray Each Nare Daily  . insulin aspart  0-20 Units Subcutaneous TID WC  . insulin aspart  0-5 Units Subcutaneous QHS  . levothyroxine  150 mcg Oral QAC breakfast  . lidocaine  3 patch Transdermal Q24H  . meclizine  25 mg Oral QID  . simvastatin  20 mg Oral q1800   PRN:  HYDROcodone-acetaminophen, nitroGLYCERIN, ondansetron (ZOFRAN) IV  Diet:  Carb Control thin liquids Activity:  As tolerated DVT Prophylaxis:  none  CLINICALLY SIGNIFICANT STUDIES Basic Metabolic Panel:   Recent Labs Lab 01/16/13 1053 01/16/13 1104  NA 137  140  K 3.5 3.5  CL 98 98  CO2 27  --   GLUCOSE 246* 246*  BUN 8 7  CREATININE 0.65 0.70  CALCIUM 8.9  --    Liver Function Tests:   Recent Labs Lab 01/16/13 1053  AST 20  ALT 19  ALKPHOS 89  BILITOT 0.3  PROT 7.4  ALBUMIN 3.6   CBC:   Recent Labs Lab 01/16/13 1053 01/16/13 1104  WBC 10.2  --   NEUTROABS 6.2  --   HGB 13.0 13.9  HCT 38.4 41.0  MCV 88.9  --   PLT 369  --    Coagulation:   Recent Labs Lab 01/16/13 1053  LABPROT 11.8  INR 0.87   Cardiac Enzymes:   Recent Labs Lab 01/16/13 1053 01/16/13 2205  TROPONINI <0.30 <0.30   Urinalysis:   Recent Labs Lab 01/16/13 1454  COLORURINE YELLOW  LABSPEC 1.013  PHURINE 5.5  GLUCOSEU NEGATIVE  HGBUR NEGATIVE  BILIRUBINUR NEGATIVE  KETONESUR NEGATIVE  PROTEINUR NEGATIVE  UROBILINOGEN 1.0  NITRITE NEGATIVE  LEUKOCYTESUR NEGATIVE   Lipid Panel    Component Value Date/Time   CHOL 169 01/18/2013 1409   TRIG 127 01/18/2013 1409   HDL 24* 01/18/2013 1409   CHOLHDL 7.0 01/18/2013 1409   VLDL 25 01/18/2013 1409   LDLCALC 120* 01/18/2013 1409  HgbA1C  Lab Results  Component Value Date   HGBA1C 7.8* 01/16/2013    Urine Drug Screen:     Component Value Date/Time   LABOPIA NONE DETECTED 01/16/2013 1454   COCAINSCRNUR NONE DETECTED 01/16/2013 1454   LABBENZ NONE DETECTED 01/16/2013 1454   AMPHETMU NONE DETECTED 01/16/2013 1454   THCU NONE DETECTED 01/16/2013 1454   LABBARB NONE DETECTED 01/16/2013 1454    Alcohol Level:   Recent Labs Lab 01/16/13 1053  ETH <11   CT of the brain  01/16/2013    Negative exam.   MRI of the brain  01/16/2013  Unremarkable noncontrast MRI of the head.  Specifically no evidence for restricted diffusion to suggest an acute infarct.    MRA of the brain    2D Echocardiogram    Carotid Doppler    CXR  01/16/2013   No active disease of the chest.   EKG  normal sinus rhythm.   Dg Epidurography  01/05/2013    Technically successful first interlaminar epidural steroid  injection on the right at L5-S1.  Given the osseous restrictions at L5-S1, a caudal approach for epidural injections may be more useful subsequently.      Nm Myocar Multi W/spect W/wall Motion / Ef  01/18/2013    Normal Lexiscan myovue with no ischemia or infarct EF 56%    Therapy Recommendations no PT, no OT   GENERAL EXAM: Patient is in no distress  CARDIOVASCULAR: Regular rate and rhythm, no murmurs, no carotid bruits  NEUROLOGIC: MENTAL STATUS: awake, alert, language fluent, comprehension intact, naming intact CRANIAL NERVE:  INTERMITTENT STUTTERING, pupils equal and reactive to light, visual fields full to confrontation, extraocular muscles intact, no nystagmus, facial sensation and strength symmetric, uvula midline, shoulder shrug symmetric, tongue midline. MOTOR: normal bulk and tone, full strength in the BUE, BLE SENSORY: normal and symmetric to light touch COORDINATION: finger-nose-finger, fine finger movements, heel-shin normal REFLEXES: deep tendon reflexes present and symmetric GAIT/STATION: LIMPING GAIT   ASSESSMENT Ms. Alexandra [name] is a 52 y.o. female presenting with difficulty with speech and right sided weakness.  Imaging confirms no acute stroke. Dx:  NON ORGANIC STRESS REACTION, no stroke or TIA dx.  On no antithrombotics prior to admission. Now on aspirin 325 mg orally every day for secondary stroke prevention. Patient with resultant intermittent stuttering and limping gait. Work up completed.  Hypertension obstructive sleep apnea Diabetes, HgbA1c 7.8, goal < 7.0 Hyperlipidemia, LDL 120, on no statin PTA, now on Zocor 20, goal LDL < 100 (< 70 for diabetics)  Hospital day # 3  TREATMENT/PLAN  Continue aspirin 325 mg orally every day for secondary stroke prevention. No further stroke workup indicated. Ongoing risk factor control by Primary Care Physician Stroke Service will sign off. Please call should any needs arise. No stroke followup indicated  Annie Main, MSN, RN, ANVP-BC, ANP-BC, GNP-BC [facility] Pager: [phone] 01/19/2013 10:05 AM  I have personally obtained a history, examined the patient, evaluated imaging results, and formulated the assessment and plan of care. I agree with the above.  Suanne Marker, MD 01/19/2013, 7:37 PM Certified in Neurology, Neurophysiology and Neuroimaging [facility] - Stroke Team  Please refer to [email or web address] for on-call Stroke MD

## 2013-01-19 NOTE — Consult Note (Signed)
Reason for Consult: psych clearance Referring Physician: [name] is an 52 y.o. female.  HPI: HPI: Patient has been stressed out about several incidents in her life. She was forced to leave her job x 16 years and she has completed her bachelor degree but unable to find a job. She has financial problems and she has custody of 54 years old grandson because his mom was on drugs and not to been compliant with DSS. She lives with her husband, son, daughter and grandson. She endorses feeling depression and anxiety. She denies safety concerns and agree to follow up with out patient services and medication management.  MSE: Patient has depressed and anxious mood and has no si/hi and no evidence of psychosis. She has normal speech and low volume.  Mental Status Examination: Patient appeared as per his stated age, casually dressed, and fairly groomed, and maintaining good eye contact. Patient has depressed mood and his affect was anxious. He has normal rate, stuttring, and lobe volume of speech. His thought process is linear and goal directed. Patient has denied suicidal, homicidal ideations, intentions or plans. Patient has no evidence of auditory or visual hallucinations, delusions, and paranoia. Patient has fair insight judgment and impulse control.  Past Medical History  Diagnosis Date  . GERD (gastroesophageal reflux disease)     prn med.  . Hypothyroidism   . Hypertension     new dx. - will start med. 06/28/2011  . Sleep apnea     prior to gastric bypass - no sleep study since bypass  . Rotator cuff tear, right   . Diabetes mellitus   . Hyperlipidemia   . Hiatal hernia 1998  . Hyperplastic colon polyp   . Fatty liver   . Hyperthyroidism     Past Surgical History  Procedure Laterality Date  . Cesarean section  1991  . Cholecystectomy  1989  . Shoulder arthroscopy distal clavicle excision and open rotator cuff repair  07/26/2010    right  . Shoulder arthroscopy  01/02/2006;  05/31/2004    left 2007; right 2005  . Exploratory laparotomy  05/08/2000    right salpingo-oophorectomy  . Abdominal hysterectomy  06/25/2000    left salpingo-oophorectomy  . 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 Laminotomy / excision disk posterior cervical spine  08/10/2009    C7-T1  . Thyroid surgery  early 2000s    goiter  . Cardiac catheterization  04/01/2006  . Shoulder arthroscopy  07/02/2011    Procedure: ARTHROSCOPY SHOULDER;  Surgeon: Wyn Forster., MD;  Location: [facility];  Service: Orthopedics;  Laterality: Right;  repair supraspinatus, Debride glenohumeral joint, labrum  . Back surgery  1997    discetomy, lumbar spinal fusion  . Tubal ligation      Family History  Problem Relation Age of Onset  . Coronary artery disease      Social History:  reports that she has never smoked. She has never used smokeless tobacco. She reports that she does not drink alcohol or use illicit drugs.  Allergies:  Allergies  Allergen Reactions  . Shrimp Flavor Swelling    Swelling of tongue and rash  . Ace Inhibitors Cough  . Oxycodone-Acetaminophen Rash    Medications: I have reviewed the patient's current medications.  Results for orders placed during the hospital encounter of 01/16/13 (from the past 48 hour(s))  GLUCOSE, CAPILLARY     Status: Abnormal   Collection Time    01/17/13  8:54 PM  Result Value Range   Glucose-Capillary 189 (*) 70 - 99 mg/dL  GLUCOSE, CAPILLARY     Status: Abnormal   Collection Time    01/18/13  7:22 AM      Result Value Range   Glucose-Capillary 175 (*) 70 - 99 mg/dL   Comment 1 Glucose Stabilizer    GLUCOSE, CAPILLARY     Status: Abnormal   Collection Time    01/18/13  1:15 PM      Result Value Range   Glucose-Capillary 169 (*) 70 - 99 mg/dL  LIPID PANEL     Status: Abnormal   Collection Time    01/18/13  2:09 PM      Result Value Range   Cholesterol 169  0 - 200 mg/dL   Triglycerides 161  <096 mg/dL   HDL 24 (*)  >04 mg/dL   Total CHOL/HDL Ratio 7.0     VLDL 25  0 - 40 mg/dL   LDL Cholesterol 540 (*) 0 - 99 mg/dL   Comment:            Total Cholesterol/HDL:CHD Risk     Coronary Heart Disease Risk Table                         Men   Women      1/2 Average Risk   3.4   3.3      Average Risk       5.0   4.4      2 X Average Risk   9.6   7.1      3 X Average Risk  23.4   11.0                Use the calculated Patient Ratio     above and the CHD Risk Table     to determine the patient's CHD Risk.                ATP III CLASSIFICATION (LDL):      <100     mg/dL   Optimal      981-191  mg/dL   Near or Above                        Optimal      130-159  mg/dL   Borderline      478-295  mg/dL   High      >621     mg/dL   Very High  GLUCOSE, CAPILLARY     Status: Abnormal   Collection Time    01/18/13  4:43 PM      Result Value Range   Glucose-Capillary 208 (*) 70 - 99 mg/dL  GLUCOSE, CAPILLARY     Status: Abnormal   Collection Time    01/18/13  9:12 PM      Result Value Range   Glucose-Capillary 184 (*) 70 - 99 mg/dL   Comment 1 Notify RN    GLUCOSE, CAPILLARY     Status: Abnormal   Collection Time    01/19/13  7:07 AM      Result Value Range   Glucose-Capillary 157 (*) 70 - 99 mg/dL   Comment 1 Notify RN    GLUCOSE, CAPILLARY     Status: Abnormal   Collection Time    01/19/13 11:32 AM      Result Value Range   Glucose-Capillary 188 (*) 70 - 99 mg/dL   Comment  1 Notify RN      Nm Myocar Multi W/spect W/wall Motion / Ef  01/18/2013   This examination was dictated by the cardiologist who supervised the examination.  Please see that report for details.  Lexiscan Myovue:  Indication: Chest Pain  The patient received .4 mg of Lexiscan as a bolus.  Resting ECG was normal and no changes with infusion Patient had mild chest pain. HR rose to maximum of 101  BP stable at 118/72 mmHg  Images were reconstructed in the vertical horizontal and short axis planes  RAW Images: motion artifact  QPS:  normal 1 Surface images normal with no RWMA;s EF 56%  Resting and stress images were normal with no infarct or ischemia  Impression:  Normal Lexiscan myovue with no ischemia or infarct EF 56%  Charlton Haws MD [facility]   Original Report Authenticated By: Judie Petit. Miles Costain, M.D.    Positive for anxiety, bad mood and sleep disturbance Blood pressure 114/78, pulse 75, temperature 97.8 F (36.6 C), temperature source Oral, resp. rate 18, height 5\' 2"  (1.575 m), weight 86.8 kg (191 lb 5.8 oz), SpO2 93.00%.   Assessment/Plan: Maj. 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 01/19/2013, 4:28 PM

## 2013-01-22 ENCOUNTER — Ambulatory Visit (INDEPENDENT_AMBULATORY_CARE_PROVIDER_SITE_OTHER): Payer: BC Managed Care – PPO | Admitting: Family

## 2013-01-22 ENCOUNTER — Encounter: Payer: Self-pay | Admitting: Family

## 2013-01-22 VITALS — BP 106/60 | HR 88 | Wt 191.0 lb

## 2013-01-22 DIAGNOSIS — R479 Unspecified speech disturbances: Secondary | ICD-10-CM

## 2013-01-22 DIAGNOSIS — G459 Transient cerebral ischemic attack, unspecified: Secondary | ICD-10-CM

## 2013-01-22 DIAGNOSIS — R4789 Other speech disturbances: Secondary | ICD-10-CM

## 2013-01-22 DIAGNOSIS — E7800 Pure hypercholesterolemia, unspecified: Secondary | ICD-10-CM

## 2013-01-22 MED ORDER — HYDROCODONE-ACETAMINOPHEN 5-325 MG PO TABS: 1.0000 | ORAL_TABLET | Freq: Three times a day (TID) | ORAL | Status: DC | PRN

## 2013-01-22 NOTE — Patient Instructions (Addendum)

## 2013-01-22 NOTE — Progress Notes (Signed)
Subjective:    Patient ID: Alexandra [name], female    DOB: 1961-05-05, 52 y.o.   MRN: [id number]  HPI 52 year old African American female, nonsmoker is in as an Hospital followup. Patient reportedly went to the emergency department with difficulty speaking and right-sided weakness. She has a CT scan and MRI that were both negative for CVA. She was diagnosed as having a TIA. However, continues to have difficulty speaking. Reports stuttering. She denies any residual weakness. She reports that the hospital believes that her symptoms are psych related. Her daughter reports a significant family history of bipolar disorder and her brother and sister are both medicated. She also has a daughter with bipolar. Her daughter reports that she's been under an increased amount of stress recently taking care of her granddaughter. Reports her husband is not supportive. In that she believes that this may be her mother's way up taking a break   Review of Systems  Constitutional: Negative.   HENT: Negative.   Respiratory: Negative.   Cardiovascular: Negative.   Gastrointestinal: Negative.   Endocrine: Negative.   Genitourinary: Negative.   Musculoskeletal: Negative.   Skin: Negative.   Neurological: Negative.   Psychiatric/Behavioral: Negative.    Past Medical History  Diagnosis Date  . GERD (gastroesophageal reflux disease)     prn med.  . Hypothyroidism   . Hypertension     new dx. - will start med. 06/28/2011  . Sleep apnea     prior to gastric bypass - no sleep study since bypass  . Rotator cuff tear, right   . Diabetes mellitus   . Hyperlipidemia   . Hiatal hernia 1998  . Hyperplastic colon polyp   . Fatty liver   . Hyperthyroidism     History   Social History  . Marital Status: Married    Spouse Name: N/A    Number of Children: N/A  . Years of Education: N/A   Occupational History  . Not on file.   Social History Main Topics  . Smoking status: Never Smoker   . Smokeless  tobacco: Never Used  . Alcohol Use: No  . Drug Use: No  . Sexually Active: Yes   Other Topics Concern  . Not on file   Social History Narrative  . No narrative on file    Past Surgical History  Procedure Laterality Date  . Cesarean section  1991  . Cholecystectomy  1989  . Shoulder arthroscopy distal clavicle excision and open rotator cuff repair  07/26/2010    right  . Shoulder arthroscopy  01/02/2006; 05/31/2004    left 2007; right 2005  . Exploratory laparotomy  05/08/2000    right salpingo-oophorectomy  . Abdominal hysterectomy  06/25/2000    left salpingo-oophorectomy  . 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 Laminotomy / excision disk posterior cervical spine  08/10/2009    C7-T1  . Thyroid surgery  early 2000s    goiter  . Cardiac catheterization  04/01/2006  . Shoulder arthroscopy  07/02/2011    Procedure: ARTHROSCOPY SHOULDER;  Surgeon: Wyn Forster., MD;  Location: [facility];  Service: Orthopedics;  Laterality: Right;  repair supraspinatus, Debride glenohumeral joint, labrum  . Back surgery  1997    discetomy, lumbar spinal fusion  . Tubal ligation      Family History  Problem Relation Age of Onset  . Coronary artery disease      Allergies  Allergen Reactions  . Shrimp Flavor Swelling  Swelling of tongue and rash  . Ace Inhibitors Cough  . Oxycodone-Acetaminophen Rash    Current Outpatient Prescriptions on File Prior to Visit  Medication Sig Dispense Refill  . aspirin 325 MG tablet Take 1 tablet (325 mg total) by mouth daily.  30 tablet  3  . bisoprolol-hydrochlorothiazide (ZIAC) 5-6.25 MG per tablet Take 1 tablet by mouth daily.      . clonazePAM (KLONOPIN) 0.5 MG tablet Take 1 tablet (0.5 mg total) by mouth 2 (two) times daily.  60 tablet  0  . diclofenac sodium (VOLTAREN) 1 % GEL Apply 1 application topically 4 (four) times daily.  100 g  0  . escitalopram (LEXAPRO) 10 MG tablet Take 1 tablet (10 mg total) by mouth daily.  30  tablet  0  . fluticasone (FLONASE) 50 MCG/ACT nasal spray Place 2 sprays into the nose daily.  16 g  6  . levothyroxine (SYNTHROID, LEVOTHROID) 150 MCG tablet TAKE 1 TABLET EVERY DAY  90 tablet  3  . lidocaine (LIDODERM) 5 % Place 3 patches onto the skin daily. Remove & Discard patch within 12 hours or as directed by MD  90 patch  2  . meclizine (ANTIVERT) 25 MG tablet Take 1 tablet (25 mg total) by mouth 4 (four) times daily.  28 tablet  0  . metFORMIN (GLUCOPHAGE) 500 MG tablet Take 1 tablet (500 mg total) by mouth 2 (two) times daily with a meal.  180 tablet  3  . nitroGLYCERIN (NITROSTAT) 0.4 MG SL tablet Place 1 tablet (0.4 mg total) under the tongue every 5 (five) minutes as needed for chest pain (CP or SOB).  60 tablet  12  . simvastatin (ZOCOR) 20 MG tablet Take 1 tablet (20 mg total) by mouth at bedtime.  30 tablet  3  . 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 [DISCONTINUED] DULoxetine (CYMBALTA) 20 MG capsule Take 20 mg by mouth daily. Samples per Dr Lovell Sheehan      . [DISCONTINUED] gabapentin (NEURONTIN) 300 MG capsule Take 300 mg by mouth 2 (two) times daily. Per Dr Teressa Senter       No current facility-administered medications on file prior to visit.    BP 106/60  Pulse 88  Wt 191 lb (86.637 kg)  BMI 34.93 kg/m2  SpO2 98%chart    Objective:   Physical Exam  Constitutional: She is oriented to person, place, and time. She appears well-developed and well-nourished.  Speech is somewhat garbled. Stuttering moderately.  HENT:  Right Ear: External ear normal.  Left Ear: External ear normal.  Nose: Nose normal.  Mouth/Throat: Oropharynx is clear and moist.  Neck: Normal range of motion. Neck supple.  Cardiovascular: Normal rate, regular rhythm and normal heart sounds.   Pulmonary/Chest: Effort normal and breath sounds normal.  Abdominal: Soft. Bowel sounds are normal.  Musculoskeletal: Normal range of  motion.  Neurological: She is alert and oriented to person, place, and time. She has normal reflexes.  Skin: Skin is warm and dry.  Psychiatric: She has a normal mood and affect.          Assessment & Plan:  Assessment: 1. Slurred speech 2. Status post TIA 3. Hypothyroidism 4. Depression   Plan: Continue current medications. However a carotid Doppler of her neck to be sure that there is no occlusion potentially cause a TIA. I'll refer to neurology neurological clearance to be sure that her  symptoms don't have a pathological cause. At that point, if she is cleared from both of these tests I will send her to psych for further evaluation.

## 2013-01-25 ENCOUNTER — Encounter: Payer: Self-pay | Admitting: Family

## 2013-01-28 ENCOUNTER — Encounter: Payer: Self-pay | Admitting: Internal Medicine

## 2013-02-01 ENCOUNTER — Encounter (INDEPENDENT_AMBULATORY_CARE_PROVIDER_SITE_OTHER): Payer: BC Managed Care – PPO

## 2013-02-01 DIAGNOSIS — H53129 Transient visual loss, unspecified eye: Secondary | ICD-10-CM

## 2013-02-01 DIAGNOSIS — R404 Transient alteration of awareness: Secondary | ICD-10-CM

## 2013-02-01 DIAGNOSIS — G459 Transient cerebral ischemic attack, unspecified: Secondary | ICD-10-CM

## 2013-02-01 DIAGNOSIS — R55 Syncope and collapse: Secondary | ICD-10-CM

## 2013-02-01 DIAGNOSIS — R479 Unspecified speech disturbances: Secondary | ICD-10-CM

## 2013-02-01 DIAGNOSIS — R063 Periodic breathing: Secondary | ICD-10-CM

## 2013-02-03 ENCOUNTER — Ambulatory Visit: Payer: BC Managed Care – PPO | Attending: Family | Admitting: Speech Pathology

## 2013-02-03 ENCOUNTER — Encounter: Payer: Self-pay | Admitting: Family

## 2013-02-03 ENCOUNTER — Ambulatory Visit: Payer: BC Managed Care – PPO | Admitting: Family

## 2013-02-03 ENCOUNTER — Telehealth: Payer: Self-pay | Admitting: Internal Medicine

## 2013-02-03 ENCOUNTER — Ambulatory Visit (INDEPENDENT_AMBULATORY_CARE_PROVIDER_SITE_OTHER): Payer: BC Managed Care – PPO | Admitting: Family

## 2013-02-03 VITALS — BP 110/70 | HR 93 | Temp 98.9°F | Wt 191.0 lb

## 2013-02-03 DIAGNOSIS — R42 Dizziness and giddiness: Secondary | ICD-10-CM

## 2013-02-03 DIAGNOSIS — J029 Acute pharyngitis, unspecified: Secondary | ICD-10-CM

## 2013-02-03 DIAGNOSIS — IMO0001 Reserved for inherently not codable concepts without codable children: Secondary | ICD-10-CM | POA: Insufficient documentation

## 2013-02-03 DIAGNOSIS — R209 Unspecified disturbances of skin sensation: Secondary | ICD-10-CM | POA: Insufficient documentation

## 2013-02-03 DIAGNOSIS — R471 Dysarthria and anarthria: Secondary | ICD-10-CM | POA: Insufficient documentation

## 2013-02-03 NOTE — Telephone Encounter (Signed)
Patient Information:  Caller Name: Fae  Phone: [phone]  Patient: Alexandra [name], Alexandra [name]  Gender: Female  DOB: 07/25/1961  Age: 52 Years  PCP: Darryll Capers (Adults only)  Pregnant: No  Office Follow Up:  Does the office need to follow up with this patient?: No  Instructions For The Office: N/A   Symptoms  Reason For Call & Symptoms: Pt states she has been dizzy and falling, headache, sore throat. Speech therapy form TIA, 01/16/13.  Reviewed Health History In EMR: Yes  Reviewed Medications In EMR: Yes  Reviewed Allergies In EMR: Yes  Reviewed Surgeries / Procedures: Yes  Date of Onset of Symptoms: 02/02/2013 OB / GYN:  LMP: Unknown  Guideline(s) Used:  Dizziness  Disposition Per Guideline:   See Today in Office  Reason For Disposition Reached:   Patient wants to be seen  Advice Given:  Temporary Dizziness  is usually a harmless symptom. It can be caused by not drinking enough water during sports or hot weather. It can also be caused by skipping a meal, too much sun exposure, standing up suddenly, standing too long in one place or even a viral illness.  Drink Fluids:  Drink several glasses of fruit juice, other clear fluids, or water. This will improve hydration and blood glucose. If you have a fever or have had heat exposure, make sure the fluids are cold.  Rest for 1-2 Hours:  Lie down with feet elevated for 1 hour. This will improve blood flow and increase blood flow to the brain.  Stand Up Slowly:  Sit down or lie down if you feel dizzy.  Call Back If:  Still feel dizzy after 2 hours of rest and fluids  Passes out (faints)  You become worse.  Patient Will Follow Care Advice:  YES  Appointment Scheduled:  02/03/2013 15:00:00 Appointment Scheduled Provider:  Adline Mango [facility])

## 2013-02-03 NOTE — Progress Notes (Signed)
Subjective:    Patient ID: Alexandra [name], female    DOB: 1961-03-26, 52 y.o.   MRN: [id number]  Sore Throat  Associated symptoms include ear pain.  Otalgia  Associated symptoms include a sore throat.   52 year old [place] American female, nonsmoker, patient of Dr. Lovell Sheehan is in today with complaints of sore throat, ear pain, headache x1 day. The Tylenol that improved her symptoms. Reports dizziness has been ongoing. She was hospitalized for possible TIA for which symptoms have not resolved. She continues to have slurred speech. She is seeing speech pathology for previous providers have alluded that her symptoms could be related to an underlying psychiatric disorder.   Review of Systems  Constitutional: Negative.   HENT: Positive for ear pain and sore throat.   Eyes: Negative.   Respiratory: Negative.   Cardiovascular: Negative.   Gastrointestinal: Negative.   Musculoskeletal: Negative.   Skin: Negative.   Hematological: Negative.   Psychiatric/Behavioral: Negative.    Past Medical History  Diagnosis Date  . GERD (gastroesophageal reflux disease)     prn med.  . Hypothyroidism   . Hypertension     new dx. - will start med. 06/28/2011  . Sleep apnea     prior to gastric bypass - no sleep study since bypass  . Rotator cuff tear, right   . Diabetes mellitus   . Hyperlipidemia   . Hiatal hernia 1998  . Hyperplastic colon polyp   . Fatty liver   . Hyperthyroidism     History   Social History  . Marital Status: Married    Spouse Name: N/A    Number of Children: N/A  . Years of Education: N/A   Occupational History  . Not on file.   Social History Main Topics  . Smoking status: Never Smoker   . Smokeless tobacco: Never Used  . Alcohol Use: No  . Drug Use: No  . Sexually Active: Yes   Other Topics Concern  . Not on file   Social History Narrative  . No narrative on file    Past Surgical History  Procedure Laterality Date  . Cesarean section  1991  .  Cholecystectomy  1989  . Shoulder arthroscopy distal clavicle excision and open rotator cuff repair  07/26/2010    right  . Shoulder arthroscopy  01/02/2006; 05/31/2004    left 2007; right 2005  . Exploratory laparotomy  05/08/2000    right salpingo-oophorectomy  . Abdominal hysterectomy  06/25/2000    left salpingo-oophorectomy  . 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 Laminotomy / excision disk posterior cervical spine  08/10/2009    C7-T1  . Thyroid surgery  early 2000s    goiter  . Cardiac catheterization  04/01/2006  . Shoulder arthroscopy  07/02/2011    Procedure: ARTHROSCOPY SHOULDER;  Surgeon: Wyn Forster., MD;  Location: [facility];  Service: Orthopedics;  Laterality: Right;  repair supraspinatus, Debride glenohumeral joint, labrum  . Back surgery  1997    discetomy, lumbar spinal fusion  . Tubal ligation      Family History  Problem Relation Age of Onset  . Coronary artery disease      Allergies  Allergen Reactions  . Shrimp Flavor Swelling    Swelling of tongue and rash  . Ace Inhibitors Cough  . Oxycodone-Acetaminophen Rash    Current Outpatient Prescriptions on File Prior to Visit  Medication Sig Dispense Refill  . aspirin 325 MG tablet Take 1 tablet (325 mg total) by  mouth daily.  30 tablet  3  . bisoprolol-hydrochlorothiazide (ZIAC) 5-6.25 MG per tablet Take 1 tablet by mouth daily.      . clonazePAM (KLONOPIN) 0.5 MG tablet Take 1 tablet (0.5 mg total) by mouth 2 (two) times daily.  60 tablet  0  . diclofenac sodium (VOLTAREN) 1 % GEL Apply 1 application topically 4 (four) times daily.  100 g  0  . escitalopram (LEXAPRO) 10 MG tablet Take 1 tablet (10 mg total) by mouth daily.  30 tablet  0  . fluticasone (FLONASE) 50 MCG/ACT nasal spray Place 2 sprays into the nose daily.  16 g  6  . HYDROcodone-acetaminophen (NORCO/VICODIN) 5-325 MG per tablet Take 1 tablet by mouth every 8 (eight) hours as needed for pain.  60 tablet  1  . levothyroxine  (SYNTHROID, LEVOTHROID) 150 MCG tablet TAKE 1 TABLET EVERY DAY  90 tablet  3  . lidocaine (LIDODERM) 5 % Place 3 patches onto the skin daily. Remove & Discard patch within 12 hours or as directed by MD  90 patch  2  . meclizine (ANTIVERT) 25 MG tablet Take 1 tablet (25 mg total) by mouth 4 (four) times daily.  28 tablet  0  . metFORMIN (GLUCOPHAGE) 500 MG tablet Take 1 tablet (500 mg total) by mouth 2 (two) times daily with a meal.  180 tablet  3  . nitroGLYCERIN (NITROSTAT) 0.4 MG SL tablet Place 1 tablet (0.4 mg total) under the tongue every 5 (five) minutes as needed for chest pain (CP or SOB).  60 tablet  12  . simvastatin (ZOCOR) 20 MG tablet Take 1 tablet (20 mg total) by mouth at bedtime.  30 tablet  3  . 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 [DISCONTINUED] DULoxetine (CYMBALTA) 20 MG capsule Take 20 mg by mouth daily. Samples per Dr Lovell Sheehan      . [DISCONTINUED] gabapentin (NEURONTIN) 300 MG capsule Take 300 mg by mouth 2 (two) times daily. Per Dr Teressa Senter       No current facility-administered medications on file prior to visit.    BP 110/70  Pulse 93  Temp(Src) 98.9 F (37.2 C) (Oral)  Wt 191 lb (86.637 kg)  BMI 34.93 kg/m2  SpO2 97%chart    Objective:   Physical Exam  Constitutional: She is oriented to person, place, and time. She appears well-developed and well-nourished.  HENT:  Right Ear: External ear normal.  Left Ear: External ear normal.  Nose: Nose normal.  Mouth/Throat: Oropharynx is clear and moist.  Neck: Normal range of motion. Neck supple.  Cardiovascular: Normal rate, regular rhythm and normal heart sounds.   Pulmonary/Chest: Effort normal and breath sounds normal.  Abdominal: Soft. Bowel sounds are normal.  Neurological: She is alert and oriented to person, place, and time.  Skin: Skin is warm.  Psychiatric: She has a normal mood and affect.          Assessment & Plan:   Assessment:  1. Acute pharyngitis 2. Headache 3. Difficulty speaking possibly related to underlying bipolar disorder  Plan: Tylenol and ibuprofen as needed for sore throat. Increase water intake. Patient call the office if any questions or concerns. Recheck a schedule, and as needed.

## 2013-02-03 NOTE — Telephone Encounter (Signed)
Dr jenkins agrees 

## 2013-02-03 NOTE — Patient Instructions (Addendum)
1. Drink plenty of water.  2. Ibuprofen as needed for pain.   Viral Pharyngitis Viral pharyngitis is a viral infection that produces redness, pain, and swelling (inflammation) of the throat. It can spread from person to person (contagious). CAUSES Viral pharyngitis is caused by inhaling a large amount of certain germs called viruses. Many different viruses cause viral pharyngitis. SYMPTOMS Symptoms of viral pharyngitis include:  Sore throat.  Tiredness.  Stuffy nose.  Low-grade fever.  Congestion.  Cough. TREATMENT Treatment includes rest, drinking plenty of fluids, and the use of over-the-counter medication (approved by your caregiver). HOME CARE INSTRUCTIONS   Drink enough fluids to keep your urine clear or pale yellow.  Eat soft, cold foods such as ice cream, frozen ice pops, or gelatin dessert.  Gargle with warm salt water (1 tsp salt per 1 qt of water).  If over age 25, throat lozenges may be used safely.  Only take over-the-counter or prescription medicines for pain, discomfort, or fever as directed by your caregiver. Do not take aspirin. To help prevent spreading viral pharyngitis to others, avoid:  Mouth-to-mouth contact with others.  Sharing utensils for eating and drinking.  Coughing around others. SEEK MEDICAL CARE IF:   You are better in a few days, then become worse.  You have a fever or pain not helped by pain medicines.  There are any other changes that concern you. Document Released: 04/24/2005 Document Revised: 10/07/2011 Document Reviewed: 09/20/2010 [facility] [facility] Patient Information 2014 [place], [place].

## 2013-02-08 ENCOUNTER — Telehealth: Payer: Self-pay | Admitting: Family Medicine

## 2013-02-08 ENCOUNTER — Other Ambulatory Visit: Payer: Self-pay | Admitting: Internal Medicine

## 2013-02-08 ENCOUNTER — Ambulatory Visit: Payer: BC Managed Care – PPO | Admitting: Speech Pathology

## 2013-02-08 NOTE — Telephone Encounter (Signed)
Pt's daughter Lesly Rubenstein called. Pt is having a few issues and daughter thinks that it is the medication. ( slurred speech, memory loss & depression ) Can pt discontinue the Klonopin?

## 2013-02-08 NOTE — Telephone Encounter (Signed)
Spoke with Padonda via telephone and she says it is ok for pt to d/c clonazepam. Pt has only been taking it for 3 weeks. If pt would like, we can Rx Seroquel 50mg  qhs to help her sleep and also for family to consider psych referral for bipolar disorder///see office note 01/22/13.  Spoke with daughter Lesly Rubenstein to advise of Padonda's note. Lesly Rubenstein also asked about pt d/c Lexapro and hydrocodone. Advised that she probably should stay on the Lexapro and can discuss with Padonda during CPE on 02/17/13. I also told Lesly Rubenstein that pt should not be taking the hydrocodone regularly because it is a narcotic and is prescribed prn pain. She states that pt takes it all the time, saying that she is always in pain. I advised that she should not be taking it all the time because it is habit forming and she will not continue to get refills if we feel that pt is abusing narcotic. Daughter states that she is going to take the hydrocodone from pt so that she is not taking all the time.

## 2013-02-10 ENCOUNTER — Other Ambulatory Visit (INDEPENDENT_AMBULATORY_CARE_PROVIDER_SITE_OTHER): Payer: BC Managed Care – PPO

## 2013-02-10 DIAGNOSIS — Z0000 Encounter for general adult medical examination without abnormal findings: Secondary | ICD-10-CM

## 2013-02-10 LAB — CBC WITH DIFFERENTIAL/PLATELET
Basophils Relative: 0.3 % (ref 0.0–3.0)
Eosinophils Relative: 2.7 % (ref 0.0–5.0)
HCT: 39.5 % (ref 36.0–46.0)
Lymphs Abs: 2.5 10*3/uL (ref 0.7–4.0)
MCV: 92.3 fl (ref 78.0–100.0)
Monocytes Absolute: 0.5 10*3/uL (ref 0.1–1.0)
Neutro Abs: 3.6 10*3/uL (ref 1.4–7.7)
RBC: 4.28 Mil/uL (ref 3.87–5.11)
WBC: 6.8 10*3/uL (ref 4.5–10.5)

## 2013-02-10 LAB — TSH: TSH: 20.49 u[IU]/mL — ABNORMAL HIGH (ref 0.35–5.50)

## 2013-02-10 LAB — T3, FREE: T3, Free: 1.9 pg/mL — ABNORMAL LOW (ref 2.3–4.2)

## 2013-02-10 LAB — MICROALBUMIN / CREATININE URINE RATIO
Creatinine,U: 233.1 mg/dL
Microalb, Ur: 1.2 mg/dL (ref 0.0–1.9)

## 2013-02-10 LAB — BASIC METABOLIC PANEL
BUN: 8 mg/dL (ref 6–23)
Chloride: 107 mEq/L (ref 96–112)
Potassium: 3.8 mEq/L (ref 3.5–5.1)

## 2013-02-10 LAB — POCT URINALYSIS DIPSTICK
Ketones, UA: NEGATIVE
Leukocytes, UA: NEGATIVE
Nitrite, UA: NEGATIVE
Protein, UA: NEGATIVE
pH, UA: 5

## 2013-02-10 LAB — HEPATIC FUNCTION PANEL
ALT: 21 U/L (ref 0–35)
Total Protein: 6.9 g/dL (ref 6.0–8.3)

## 2013-02-10 LAB — LIPID PANEL
Total CHOL/HDL Ratio: 4
VLDL: 18.2 mg/dL (ref 0.0–40.0)

## 2013-02-10 NOTE — Addendum Note (Signed)
Addended by: Bonnye Fava on: 02/10/2013 08:36 AM   Modules accepted: Orders

## 2013-02-12 ENCOUNTER — Ambulatory Visit: Payer: BC Managed Care – PPO | Admitting: Internal Medicine

## 2013-02-15 ENCOUNTER — Encounter: Payer: Self-pay | Admitting: Neurology

## 2013-02-15 ENCOUNTER — Ambulatory Visit (INDEPENDENT_AMBULATORY_CARE_PROVIDER_SITE_OTHER): Payer: BC Managed Care – PPO | Admitting: Neurology

## 2013-02-15 ENCOUNTER — Ambulatory Visit: Payer: BC Managed Care – PPO | Admitting: Family

## 2013-02-15 VITALS — BP 136/78 | HR 72 | Temp 97.9°F | Ht 62.0 in | Wt 197.0 lb

## 2013-02-15 DIAGNOSIS — F8081 Childhood onset fluency disorder: Secondary | ICD-10-CM

## 2013-02-15 DIAGNOSIS — G459 Transient cerebral ischemic attack, unspecified: Secondary | ICD-10-CM

## 2013-02-15 NOTE — Progress Notes (Signed)
NEUROLOGY CONSULTATION NOTE  Alexandra [name] MRN: [id number] DOB: 1960-11-23  Referring provider: Ms. Orvan Falconer Primary care provider: Dr. Lovell Sheehan  Reason for consult:  Stuttering speech  HISTORY OF PRESENT ILLNESS: Alexandra [name] is a 52 y.o. female with history of hypertension, hypothyroidism, diabetes and OSA who presents for evaluation of stuttering speech.  She was recently admitted to the hospital from 01/16/13 to 01/19/13 for stroke like symptoms.  Chart reviewed.  She exhibited altered mental status and inability to speak.  She also reportedly had left sided weakness and left facial droop.  She also complained of chest pain.  Cardiac workup negative.  MRI of brain was reviewed and was unremarkable.  Stroke workup included LDL 120, TTE EF 60-65%, and later had an unremarkable carotid doppler.  She was started on ASA 325mg  and simvastatin.  Her symptoms resolved other than persistent stuttering speech.  She was evaluated by in-house neurology who suspected that it was not physiologic.  She was evaluated by psychiatry who did diagnose depression and stress reaction.  Since discharge from the hospital, she has been receiving speech therapy and she feels her speech is improving.  She notes difficulty with word-finding.  Since discharge, she also notes problems with balance and memory.    According to the chart, her daughter said that she was under a lot of stress, including caring for her granddaughter, and that her husband was not supportive.  There is also a family history of bipolar disorder.  PAST MEDICAL HISTORY: Past Medical History  Diagnosis Date  . GERD (gastroesophageal reflux disease)     prn med.  . Hypothyroidism   . Hypertension     new dx. - will start med. 06/28/2011  . Sleep apnea     prior to gastric bypass - no sleep study since bypass  . Rotator cuff tear, right   . Diabetes mellitus   . Hyperlipidemia   . Hiatal hernia 1998  . Hyperplastic colon polyp   . Fatty  liver   . Hyperthyroidism     PAST SURGICAL HISTORY: Past Surgical History  Procedure Laterality Date  . Cesarean section  1991  . Cholecystectomy  1989  . Shoulder arthroscopy distal clavicle excision and open rotator cuff repair  07/26/2010    right  . Shoulder arthroscopy  01/02/2006; 05/31/2004    left 2007; right 2005  . Exploratory laparotomy  05/08/2000    right salpingo-oophorectomy  . Abdominal hysterectomy  06/25/2000    left salpingo-oophorectomy  . 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 Laminotomy / excision disk posterior cervical spine  08/10/2009    C7-T1  . Thyroid surgery  early 2000s    goiter  . Cardiac catheterization  04/01/2006  . Shoulder arthroscopy  07/02/2011    Procedure: ARTHROSCOPY SHOULDER;  Surgeon: Wyn Forster., MD;  Location: [facility];  Service: Orthopedics;  Laterality: Right;  repair supraspinatus, Debride glenohumeral joint, labrum  . Back surgery  1997    discetomy, lumbar spinal fusion  . Tubal ligation      MEDICATIONS: Current Outpatient Prescriptions on File Prior to Visit  Medication Sig Dispense Refill  . aspirin 325 MG tablet Take 1 tablet (325 mg total) by mouth daily.  30 tablet  3  . escitalopram (LEXAPRO) 10 MG tablet TAKE 1 TABLET BY MOUTH EVERY DAY  30 tablet  11  . fluticasone (FLONASE) 50 MCG/ACT nasal spray Place 2 sprays into the nose daily.  16 g  6  .  HYDROcodone-acetaminophen (NORCO/VICODIN) 5-325 MG per tablet Take 1 tablet by mouth every 8 (eight) hours as needed for pain.  60 tablet  1  . levothyroxine (SYNTHROID, LEVOTHROID) 150 MCG tablet TAKE 1 TABLET EVERY DAY  90 tablet  3  . lidocaine (LIDODERM) 5 % Place 3 patches onto the skin daily. Remove & Discard patch within 12 hours or as directed by MD  90 patch  2  . metFORMIN (GLUCOPHAGE) 500 MG tablet Take 1 tablet (500 mg total) by mouth 2 (two) times daily with a meal.  180 tablet  3  . nitroGLYCERIN (NITROSTAT) 0.4 MG SL tablet Place 1 tablet  (0.4 mg total) under the tongue every 5 (five) minutes as needed for chest pain (CP or SOB).  60 tablet  12  . simvastatin (ZOCOR) 20 MG tablet Take 1 tablet (20 mg total) by mouth at bedtime.  30 tablet  3  . bisoprolol-hydrochlorothiazide (ZIAC) 5-6.25 MG per tablet Take 1 tablet by mouth daily.      . clonazePAM (KLONOPIN) 0.5 MG tablet Take 1 tablet (0.5 mg total) by mouth 2 (two) times daily.  60 tablet  0  . diclofenac sodium (VOLTAREN) 1 % GEL Apply 1 application topically 4 (four) times daily.  100 g  0  . meclizine (ANTIVERT) 25 MG tablet Take 1 tablet (25 mg total) by mouth 4 (four) times daily.  28 tablet  0  . 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 [DISCONTINUED] DULoxetine (CYMBALTA) 20 MG capsule Take 20 mg by mouth daily. Samples per Dr Lovell Sheehan      . [DISCONTINUED] gabapentin (NEURONTIN) 300 MG capsule Take 300 mg by mouth 2 (two) times daily. Per Dr Teressa Senter       No current facility-administered medications on file prior to visit.    ALLERGIES: Allergies  Allergen Reactions  . Shrimp Flavor Swelling    Swelling of tongue and rash  . Ace Inhibitors Cough  . Oxycodone-Acetaminophen Rash    FAMILY HISTORY: Family History  Problem Relation Age of Onset  . Coronary artery disease      SOCIAL HISTORY: History   Social History  . Marital Status: Married    Spouse Name: N/A    Number of Children: N/A  . Years of Education: N/A   Occupational History  . Not on file.   Social History Main Topics  . Smoking status: Never Smoker   . Smokeless tobacco: Never Used  . Alcohol Use: No  . Drug Use: No  . Sexually Active: Yes   Other Topics Concern  . Not on file   Social History Narrative  . No narrative on file    REVIEW OF SYSTEMS: Constitutional: No fevers, chills, or sweats, no generalized fatigue, change in appetite Eyes: No visual changes, double vision, eye pain Ear, nose  and throat: No hearing loss, ear pain, nasal congestion, sore throat Cardiovascular: No chest pain, palpitations Respiratory:  No shortness of breath at rest or with exertion, wheezes GastrointestinaI: No nausea, vomiting, diarrhea, abdominal pain, fecal incontinence Genitourinary:  No dysuria, urinary retention or frequency Musculoskeletal:  No neck pain, back pain Integumentary: No rash, pruritus, skin lesions Neurological: as above Psychiatric: No depression, insomnia, anxiety Endocrine: No palpitations, fatigue, diaphoresis, mood swings, change in appetite, change in weight, increased thirst Hematologic/Lymphatic:  No anemia, purpura, petechiae. Allergic/Immunologic: no itchy/runny eyes, nasal congestion, recent allergic reactions, rashes  PHYSICAL EXAM:  Filed Vitals:   02/15/13 1347  BP: 136/78  Pulse: 72  Temp: 97.9 F (36.6 C)   General: No acute distress Head:  Normocephalic/atraumatic Neck: supple, no paraspinal tenderness, full range of motion Back: No paraspinal tenderness Heart: regular rate and rhythm Lungs: Clear to auscultation bilaterally. Vascular: No carotid bruits. Neurological Exam: Mental status: alert and oriented to person, place, time and self, speech fluent and not dysarthric, language intact. Cranial nerves: CN I: not tested CN II: pupils equal, round and reactive to light, visual fields intact, fundi unremarkable. CN III, IV, VI:  full range of motion, no nystagmus, no ptosis CN V: facial sensation intact CN VII: upper and lower face symmetric CN VIII: hearing intact CN IX, X: gag intact, uvula midline CN XI: sternocleidomastoid and trapezius muscles intact CN XII: tongue midline Bulk & Tone: normal, no fasciculations. Motor: 5/5 throughout Sensation: pinprick intact.  Trace reduced vibration in toes. Deep Tendon Reflexes: 2+ throughout, except absent at ankles, toes down Finger to nose testing: normal Gait: unsteady, limp, holds on to wall at  times, tandem walks but unsteady. Romberg with sway.  IMPRESSION & PLAN: Alexandra [name] is a 52 y.o. female with recent TIA who presents with persistent stuttering speech.  I can't say for sure if it's physiologic, but likely non-organic especially given her other unexplained symptoms such as gait imbalance and memory problems in light of a normal scan. 1.  Psychiatric care and counseling. 2.  Continue speech therapy. 3.  Continue secondary stroke prevention: ASA and statin (goal LDL <70), BP control, diabetes control. 4.  No further neurologic workup warranted.    40 minutes spent with patient and her daughter, over 50% spent counseling and coordinating care.  Thank you for allowing me to take part in the care of this patient.  Shon Millet, DO  CC:  Adline Mango, FNP  Darryll Capers, MD

## 2013-02-15 NOTE — Patient Instructions (Addendum)
Unlikely your residual symptoms such as memory problems and stuttering speech are due to a TIA or stroke.  You are already on secondary stroke prevention therapy, such as aspirin and cholesterol medication, which is good.  Continue speech therapy.  I recommend follow up with psychiatry or a counselor.

## 2013-02-16 ENCOUNTER — Ambulatory Visit: Payer: BC Managed Care – PPO

## 2013-02-17 ENCOUNTER — Encounter: Payer: BC Managed Care – PPO | Admitting: Internal Medicine

## 2013-02-17 ENCOUNTER — Encounter: Payer: Self-pay | Admitting: Family

## 2013-02-17 ENCOUNTER — Ambulatory Visit (INDEPENDENT_AMBULATORY_CARE_PROVIDER_SITE_OTHER): Payer: BC Managed Care – PPO | Admitting: Family

## 2013-02-17 VITALS — BP 150/98 | HR 83 | Ht 62.25 in | Wt 193.0 lb

## 2013-02-17 DIAGNOSIS — E039 Hypothyroidism, unspecified: Secondary | ICD-10-CM

## 2013-02-17 DIAGNOSIS — Z0000 Encounter for general adult medical examination without abnormal findings: Secondary | ICD-10-CM

## 2013-02-17 DIAGNOSIS — F319 Bipolar disorder, unspecified: Secondary | ICD-10-CM

## 2013-02-17 MED ORDER — QUETIAPINE FUMARATE 50 MG PO TABS: 50.0000 mg | ORAL_TABLET | Freq: Every day | ORAL | Status: DC

## 2013-02-17 NOTE — Progress Notes (Signed)
Subjective:    Patient ID: Alexandra [name], female    DOB: 1961/07/06, 52 y.o.   MRN: [id number]  HPI   This is a routine physical examination for this 52 year old African American female. Reviewed all health maintenance protocols including mammography colonoscopy bone density and reviewed appropriate screening labs. Her immunization history was reviewed as well as her current medications and allergies refills of her chronic medications were given and the plan for yearly health maintenance was discussed all orders and referrals were made as appropriate.   Pt's recent hospitalization and neuro consult reviewed for onset of speech difficulty. Pt reports having difficulty falling asleep and staying asleep. States she hears conversations when trying to fall asleep and often sees people in her peripheral vision that vanish when she turns around. States she is often fearful. Daughter present during examination; reports minimal spousal and family support. Pt becomes tearful stating she believes "my family would be better off if I was not here". She denies any homicidal or suicidal ideation.   Review of Systems  Constitutional: Negative.   HENT: Negative.   Eyes: Negative.   Respiratory: Negative.   Cardiovascular: Negative.   Gastrointestinal: Positive for abdominal pain and diarrhea.  Endocrine: Negative.   Genitourinary: Negative.   Musculoskeletal: Negative.   Skin: Negative.   Allergic/Immunologic: Negative.   Neurological: Positive for speech difficulty.  Hematological: Negative.   Psychiatric/Behavioral: Positive for hallucinations and sleep disturbance. Negative for suicidal ideas and self-injury. The patient is nervous/anxious.        Past Medical History  Diagnosis Date  . GERD (gastroesophageal reflux disease)     prn med.  . Hypothyroidism   . Hypertension     new dx. - will start med. 06/28/2011  . Sleep apnea     prior to gastric bypass - no sleep study since bypass  .  Rotator cuff tear, right   . Diabetes mellitus   . Hyperlipidemia   . Hiatal hernia 1998  . Hyperplastic colon polyp   . Fatty liver   . Hyperthyroidism     History   Social History  . Marital Status: Married    Spouse Name: N/A    Number of Children: N/A  . Years of Education: N/A   Occupational History  . Not on file.   Social History Main Topics  . Smoking status: Never Smoker   . Smokeless tobacco: Never Used  . Alcohol Use: No  . Drug Use: No  . Sexually Active: Yes   Other Topics Concern  . Not on file   Social History Narrative  . No narrative on file    Past Surgical History  Procedure Laterality Date  . Cesarean section  1991  . Cholecystectomy  1989  . Shoulder arthroscopy distal clavicle excision and open rotator cuff repair  07/26/2010    right  . Shoulder arthroscopy  01/02/2006; 05/31/2004    left 2007; right 2005  . Exploratory laparotomy  05/08/2000    right salpingo-oophorectomy  . Abdominal hysterectomy  06/25/2000    left salpingo-oophorectomy  . 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 Laminotomy / excision disk posterior cervical spine  08/10/2009    C7-T1  . Thyroid surgery  early 2000s    goiter  . Cardiac catheterization  04/01/2006  . Shoulder arthroscopy  07/02/2011    Procedure: ARTHROSCOPY SHOULDER;  Surgeon: Wyn Forster., MD;  Location: [facility];  Service: Orthopedics;  Laterality: Right;  repair supraspinatus, Debride  glenohumeral joint, labrum  . Back surgery  1997    discetomy, lumbar spinal fusion  . Tubal ligation      Family History  Problem Relation Age of Onset  . Coronary artery disease      Allergies  Allergen Reactions  . Shrimp Flavor Swelling    Swelling of tongue and rash  . Ace Inhibitors Cough  . Oxycodone-Acetaminophen Rash    Current Outpatient Prescriptions on File Prior to Visit  Medication Sig Dispense Refill  . aspirin 325 MG tablet Take 1 tablet (325 mg total) by mouth daily.   30 tablet  3  . bisoprolol-hydrochlorothiazide (ZIAC) 5-6.25 MG per tablet Take 1 tablet by mouth daily.      . clonazePAM (KLONOPIN) 0.5 MG tablet Take 1 tablet (0.5 mg total) by mouth 2 (two) times daily.  60 tablet  0  . diclofenac sodium (VOLTAREN) 1 % GEL Apply 1 application topically 4 (four) times daily.  100 g  0  . escitalopram (LEXAPRO) 10 MG tablet TAKE 1 TABLET BY MOUTH EVERY DAY  30 tablet  11  . fluticasone (FLONASE) 50 MCG/ACT nasal spray Place 2 sprays into the nose daily.  16 g  6  . HYDROcodone-acetaminophen (NORCO/VICODIN) 5-325 MG per tablet Take 1 tablet by mouth every 8 (eight) hours as needed for pain.  60 tablet  1  . levothyroxine (SYNTHROID, LEVOTHROID) 150 MCG tablet TAKE 1 TABLET EVERY DAY  90 tablet  3  . lidocaine (LIDODERM) 5 % Place 3 patches onto the skin daily. Remove & Discard patch within 12 hours or as directed by MD  90 patch  2  . meclizine (ANTIVERT) 25 MG tablet Take 1 tablet (25 mg total) by mouth 4 (four) times daily.  28 tablet  0  . metFORMIN (GLUCOPHAGE) 500 MG tablet Take 1 tablet (500 mg total) by mouth 2 (two) times daily with a meal.  180 tablet  3  . nitroGLYCERIN (NITROSTAT) 0.4 MG SL tablet Place 1 tablet (0.4 mg total) under the tongue every 5 (five) minutes as needed for chest pain (CP or SOB).  60 tablet  12  . simvastatin (ZOCOR) 20 MG tablet Take 1 tablet (20 mg total) by mouth at bedtime.  30 tablet  3  . 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 [DISCONTINUED] DULoxetine (CYMBALTA) 20 MG capsule Take 20 mg by mouth daily. Samples per Dr Lovell Sheehan      . [DISCONTINUED] gabapentin (NEURONTIN) 300 MG capsule Take 300 mg by mouth 2 (two) times daily. Per Dr Teressa Senter       No current facility-administered medications on file prior to visit.    BP 150/98  Pulse 83  Ht 5' 2.25" (1.581 m)  Wt 193 lb (87.544 kg)  BMI 35.02 kg/m2  SpO2 93%chart Objective:    Physical Exam  Constitutional: She is oriented to person, place, and time. She appears well-developed and well-nourished.  HENT:  Head: Normocephalic and atraumatic.  Eyes: Conjunctivae and EOM are normal. Pupils are equal, round, and reactive to light.  Neck: Normal range of motion. Neck supple.  Cardiovascular: Normal rate, regular rhythm and normal heart sounds.   Pulmonary/Chest: Effort normal and breath sounds normal.  Abdominal: Soft. Bowel sounds are normal.  Pain with palpation to RLQ; no rebound tenderness noted  Musculoskeletal: Normal range of motion.  Neurological: She is alert and oriented to person, place, and  time.  Skin: Skin is warm and dry.          Assessment & Plan:  1. Physical 2. Bipolar Disorder 3. Diabetes 4. Hypothyroidism  Pt educated on importance of taking all prescribed medications, with special emphasis on Metformin and Synthroid due to abnormal lab results. Pt prescribed Seroquel to help achieve rest and manage psych issues. Referral made to therapist in order to further explore mental health issues. Pt will discontinue speech therapy. Pt's work note extended for one month and instructed to follow up with PCP in 4 weeks or sooner if symptoms worsen/persist of with any questions or concerns.   Note by Davonna Belling, FNP Student

## 2013-02-17 NOTE — Patient Instructions (Addendum)
Manic Depression (Bipolar Disorder)  Bipolar disorder is also known as manic depressive illness. It is when the brain does not function properly and causes shifts in a person's moods, energy and ability to function in everyday life. These shifts are different from the normal ups and downs that everyone experiences. Instead the shifts are severe. If this goes untreated, the person's life becomes more and more disorderly. People with this disorder can be treated can lead full and productive lives. This disorder must be managed throughout life.   SYMPTOMS    Bipolar disorder causes dramatic mood swings. These mood swings go in cycles. They cycle from extreme "highs" and irritable to deep "lows" of sadness and hopelessness.   Between the extreme moods, there are usually periods of normal mood.   Along with the mood shifts, the person will have severe changes in energy and behavior. The periods of "highs" and "lows" are called episodes of mania and depression.  Signs of mania:   Lots of energy, activity and restlessness.   Extreme "high" or good mood.   Extreme irritability.   Racing thoughts and talking very fast.   Jumping from one idea to another.   Not able to focus, easily distracted.   Little need to sleep.   Grand beliefs in one's abilities and powers.   Spending sprees.   Increased sexual drive. This can result in many sexual partners.   Poor judgment.   Abuse of drugs, particularly cocaine, alcohol, and sleeping medication.   Aggressive or provocative behavior.   A lasting period of behavior that is different from usual.   Denial that anything is wrong.  *A manic episode is identified if a "high" mood happens with three or more of the other symptoms lasting most of the day, nearly everyday for a week or longer. If the mood is more irritable in nature, four additional symptoms must be present.  Signs of depression:   Lasting feelings of sadness, anxiety, or empty mood.   Feelings of  hopelessness with negative thoughts.   Feelings of guilt, worthlessness, or helplessness.   Loss of interest or pleasure in activities once enjoyed, including sex.   Feelings of fatigue or having less energy.   Trouble focusing, making decisions, remembering.   Feeling restless or irritable.   Sleeping too little or too much.   Change in eating with possible weight gain or loss.   Feeling ongoing pain that is not caused by physical illness or injury.   Thoughts of death or suicide or suicide attempts.  *A depressive episode is identified as having five or more of the above symptoms that last most of the day, nearly everyday for two weeks or longer.  CAUSES    Research shows that there is no single cause for the disorder. Many factors act together to produce the illness.   This can be passed down from family (hereditary).   Environment may play a part.  TREATMENT    Long-term treatment is strongly recommended because bipolar disorder is a repeated illness. This disorder is better controlled if treatment is ongoing than if it is off and on.   A combination of medication and talk therapy is best for managing the disorder over time.   Medication.   Medication can be prescribed by a doctor that is an expert in treating mental disorders (psychiatrists). Medications known as "mood stabilizers" are usually prescribed to help control the illness. Other medications can be added when needed. These medicines usually treat episodes   of mania or depression that break through despite the mood stabilizer.   Talk Therapy.   Along with medication, some forms of talk therapy are helpful in providing support, education and guidance to people with the illness and their families. Studies show that this type of treatment increases mood stability, decreases need for hospitalization and improves how they function society.   Electroconvulsive Therapy (ECT).   In extreme situations where the above treatments do not work or  work too slowly to relieve severe symptoms, ECT may be considered.  Document Released: 10/21/2000 Document Revised: 10/07/2011 Document Reviewed: 06/12/2007  [employer] Patient Information 2014 [employer].

## 2013-02-19 ENCOUNTER — Ambulatory Visit: Payer: BC Managed Care – PPO

## 2013-02-22 ENCOUNTER — Encounter: Payer: BC Managed Care – PPO | Admitting: Speech Pathology

## 2013-02-22 ENCOUNTER — Telehealth: Payer: Self-pay | Admitting: Internal Medicine

## 2013-02-22 NOTE — Telephone Encounter (Signed)
Pt stated she needs more spinal injections  At [facility]. Please do referral if necessary

## 2013-02-23 ENCOUNTER — Other Ambulatory Visit: Payer: Self-pay | Admitting: *Deleted

## 2013-02-23 DIAGNOSIS — M5410 Radiculopathy, site unspecified: Secondary | ICD-10-CM

## 2013-02-25 ENCOUNTER — Other Ambulatory Visit: Payer: Self-pay | Admitting: Internal Medicine

## 2013-02-25 ENCOUNTER — Ambulatory Visit
Admission: RE | Admit: 2013-02-25 | Discharge: 2013-02-25 | Disposition: A | Discharge status: Home / Self Care | Payer: BC Managed Care – PPO | Source: Ambulatory Visit | Attending: Internal Medicine | Admitting: Internal Medicine

## 2013-02-25 VITALS — BP 121/70 | HR 68

## 2013-02-25 DIAGNOSIS — M5410 Radiculopathy, site unspecified: Secondary | ICD-10-CM

## 2013-02-25 MED ORDER — IOHEXOL 180 MG/ML  SOLN
1.0000 mL | Freq: Once | INTRAMUSCULAR | Status: AC | PRN
  Administered 2013-02-25: 1 mL via EPIDURAL

## 2013-02-25 MED ORDER — METHYLPREDNISOLONE ACETATE 40 MG/ML INJ SUSP (RADIOLOG
120.0000 mg | Freq: Once | INTRAMUSCULAR | Status: AC
  Administered 2013-02-25: 120 mg via EPIDURAL

## 2013-03-12 ENCOUNTER — Other Ambulatory Visit: Payer: Self-pay | Admitting: Internal Medicine

## 2013-03-15 ENCOUNTER — Ambulatory Visit (INDEPENDENT_AMBULATORY_CARE_PROVIDER_SITE_OTHER): Payer: BC Managed Care – PPO | Admitting: Family

## 2013-03-15 ENCOUNTER — Other Ambulatory Visit: Payer: Self-pay | Admitting: Internal Medicine

## 2013-03-15 ENCOUNTER — Encounter: Payer: Self-pay | Admitting: Family

## 2013-03-15 ENCOUNTER — Other Ambulatory Visit (INDEPENDENT_AMBULATORY_CARE_PROVIDER_SITE_OTHER): Payer: BC Managed Care – PPO

## 2013-03-15 VITALS — BP 158/92 | HR 75 | Wt 187.0 lb

## 2013-03-15 DIAGNOSIS — F319 Bipolar disorder, unspecified: Secondary | ICD-10-CM | POA: Insufficient documentation

## 2013-03-15 DIAGNOSIS — E039 Hypothyroidism, unspecified: Secondary | ICD-10-CM

## 2013-03-15 DIAGNOSIS — IMO0001 Reserved for inherently not codable concepts without codable children: Secondary | ICD-10-CM

## 2013-03-15 DIAGNOSIS — IMO0002 Reserved for concepts with insufficient information to code with codable children: Secondary | ICD-10-CM

## 2013-03-15 LAB — TSH: TSH: 0.89 u[IU]/mL (ref 0.35–5.50)

## 2013-03-15 MED ORDER — SIMVASTATIN 20 MG PO TABS: 20.0000 mg | ORAL_TABLET | Freq: Every day | ORAL | Status: DC

## 2013-03-15 MED ORDER — BISOPROLOL-HYDROCHLOROTHIAZIDE 5-6.25 MG PO TABS: 1.0000 | ORAL_TABLET | Freq: Every day | ORAL | Status: DC

## 2013-03-15 MED ORDER — QUETIAPINE FUMARATE ER 150 MG PO TB24: ORAL_TABLET | ORAL | Status: DC

## 2013-03-15 MED ORDER — METFORMIN HCL 500 MG PO TABS: 500.0000 mg | ORAL_TABLET | Freq: Two times a day (BID) | ORAL | Status: DC

## 2013-03-15 NOTE — Progress Notes (Signed)
Subjective:    Patient ID: Alexandra [name], female    DOB: [birth year], 52 y.o.   MRN: [id number]  HPI  52 year old [place] American female, nonsmoker, patient of Dr. Lovell Sheehan presents today for recheck of bipolar disorder. Patient reports that she establish with a psychiatrist and has been unable to see her since [place]'s [place] the last 2 weeks. She did not prescribe any medication. However, she does not want to see that particular psychiatrist anymore. She continues to hear voices. The voices are telling her to cut off her leg. Reports having increased pain to the lower back that radiates down her left leg. She has a known history of bulging disc. She has had epidural injections through radiology that have been ineffective. Patient has bipolar disorder and is currently on Seroquel 50 mg at bedtime. It helps her to sleep and initially helped her mood but felt like it is not working as well as it was. Denies any thoughts of suicide. Denies any helplessness, hopelessness. She continues to have garbled speech. It appears to be psychiatric in origin.  Review of Systems  Constitutional: Negative.   Respiratory: Negative.   Cardiovascular: Negative.   Gastrointestinal: Negative.   Endocrine: Negative.   Musculoskeletal: Positive for myalgias, back pain and arthralgias.       Low back pain radiating to the left leg  Allergic/Immunologic: Negative.   Neurological: Negative.   Hematological: Negative.   Psychiatric/Behavioral: Positive for hallucinations, behavioral problems, sleep disturbance and decreased concentration. Negative for suicidal ideas and self-injury. The patient is nervous/anxious. The patient is not hyperactive.        Reports hearing voices   Past Medical History  Diagnosis Date  . GERD (gastroesophageal reflux disease)     prn med.  . Hypothyroidism   . Hypertension     new dx. - will start med. 06/28/2011  . Sleep apnea     prior to gastric bypass - no sleep  study since bypass  . Rotator cuff tear, right   . Diabetes mellitus   . Hyperlipidemia   . Hiatal hernia 1998  . Hyperplastic colon polyp   . Fatty liver   . Hyperthyroidism     History   Social History  . Marital Status: Married    Spouse Name: N/A    Number of Children: N/A  . Years of Education: N/A   Occupational History  . Not on file.   Social History Main Topics  . Smoking status: Never Smoker   . Smokeless tobacco: Never Used  . Alcohol Use: No  . Drug Use: No  . Sexual Activity: Yes   Other Topics Concern  . Not on file   Social History Narrative  . No narrative on file    Past Surgical History  Procedure Laterality Date  . Cesarean section  1991  . Cholecystectomy  1989  . Shoulder arthroscopy distal clavicle excision and open rotator cuff repair  07/26/2010    right  . Shoulder arthroscopy  01/02/2006; 05/31/2004    left 2007; right 2005  . Exploratory laparotomy  05/08/2000    right salpingo-oophorectomy  . Abdominal hysterectomy  06/25/2000    left salpingo-oophorectomy  . 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 Laminotomy / excision disk posterior cervical spine  08/10/2009    C7-T1  . Thyroid surgery  early 2000s    goiter  . Cardiac catheterization  04/01/2006  . Shoulder arthroscopy  07/02/2011    Procedure: ARTHROSCOPY SHOULDER;  Surgeon: Wyn Forster., MD;  Location: [facility];  Service: Orthopedics;  Laterality: Right;  repair supraspinatus, Debride glenohumeral joint, labrum  . Back surgery  1997    discetomy, lumbar spinal fusion  . Tubal ligation      Family History  Problem Relation Age of Onset  . Coronary artery disease      Allergies  Allergen Reactions  . Shrimp Flavor Swelling    Swelling of tongue and rash  . Ace Inhibitors Cough  . Oxycodone-Acetaminophen Rash    Current Outpatient Prescriptions on File Prior to Visit  Medication Sig Dispense Refill  . aspirin 325 MG tablet Take 1 tablet (325 mg  total) by mouth daily.  30 tablet  3  . diclofenac sodium (VOLTAREN) 1 % GEL Apply 1 application topically 4 (four) times daily.  100 g  0  . fluticasone (FLONASE) 50 MCG/ACT nasal spray Place 2 sprays into the nose daily.  16 g  6  . HYDROcodone-acetaminophen (NORCO/VICODIN) 5-325 MG per tablet Take 1 tablet by mouth every 8 (eight) hours as needed for pain.  60 tablet  1  . levothyroxine (SYNTHROID, LEVOTHROID) 150 MCG tablet TAKE 1 TABLET EVERY DAY  90 tablet  3  . lidocaine (LIDODERM) 5 % PLACE 3 PATCHES ONTO THE SKIN DAILY. REMOVE & DISCARD PATCH WITHIN 12 HOURS OR AS DIRECTED BY MD  90 patch  3  . meclizine (ANTIVERT) 25 MG tablet Take 1 tablet (25 mg total) by mouth 4 (four) times daily.  28 tablet  0  . nitroGLYCERIN (NITROSTAT) 0.4 MG SL tablet Place 1 tablet (0.4 mg total) under the tongue every 5 (five) minutes as needed for chest pain (CP or SOB).  60 tablet  12  . clonazePAM (KLONOPIN) 0.5 MG tablet Take 1 tablet (0.5 mg total) by mouth 2 (two) times daily.  60 tablet  0  . 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 [DISCONTINUED] DULoxetine (CYMBALTA) 20 MG capsule Take 20 mg by mouth daily. Samples per Dr Lovell Sheehan      . [DISCONTINUED] gabapentin (NEURONTIN) 300 MG capsule Take 300 mg by mouth 2 (two) times daily. Per Dr Teressa Senter       No current facility-administered medications on file prior to visit.    BP 158/92  Pulse 75  Wt 187 lb (84.823 kg)  BMI 33.94 kg/m2chart    Objective:   Physical Exam  Constitutional: She is oriented to person, place, and time. She appears well-developed and well-nourished.  HENT:  Right Ear: External ear normal.  Left Ear: External ear normal.  Nose: Nose normal.  Mouth/Throat: Oropharynx is clear and moist.  Neck: Normal range of motion. Neck supple. No thyromegaly present.  Cardiovascular: Normal rate, regular rhythm and normal heart sounds.   Pulmonary/Chest:  Effort normal and breath sounds normal.  Abdominal: Soft. Bowel sounds are normal.  Musculoskeletal: Normal range of motion. She exhibits no edema and no tenderness.  Neurological: She is alert and oriented to person, place, and time. She has normal reflexes. She displays normal reflexes. No cranial nerve deficit. Coordination normal.  Skin: Skin is warm and dry.  Psychiatric: Her mood appears anxious. Her speech is slurred. She is actively hallucinating. She is not aggressive and not hyperactive. Thought content is paranoid. She expresses impulsivity. She expresses no homicidal and no suicidal ideation. She expresses no suicidal plans and no homicidal plans.  Assessment & Plan:  Assessment:  1. Bipolar disorder 2. Hypothyroidism 3. Low back pain 4. Bulging disc  Plan: Very uncomfortable prescribing additional pain medication with her unstable mental status. I am increase in Seroquel to 150mg  XR once daily. Referred to neurosurgery for low back pain. TSH sent today. Recheck in 2 weeks

## 2013-03-15 NOTE — Patient Instructions (Addendum)
1. Call and make an appointment with psychiatry ASAP. 2. Referred to neuro-surg for an appointment for low back pain

## 2013-03-16 ENCOUNTER — Other Ambulatory Visit: Payer: Self-pay | Admitting: Family

## 2013-03-16 MED ORDER — SIMVASTATIN 20 MG PO TABS: 20.0000 mg | ORAL_TABLET | Freq: Every day | ORAL | Status: DC

## 2013-03-16 MED ORDER — LEVOTHYROXINE SODIUM 150 MCG PO TABS: ORAL_TABLET | ORAL | Status: DC

## 2013-03-16 NOTE — Addendum Note (Signed)
Addended by: Beverely Low on: 03/16/2013 08:16 AM   Modules accepted: Orders

## 2013-03-17 ENCOUNTER — Other Ambulatory Visit: Payer: Self-pay | Admitting: Internal Medicine

## 2013-03-17 ENCOUNTER — Other Ambulatory Visit: Payer: BC Managed Care – PPO

## 2013-03-17 ENCOUNTER — Telehealth: Payer: Self-pay | Admitting: Internal Medicine

## 2013-03-17 MED ORDER — TRAMADOL HCL 50 MG PO TABS: 50.0000 mg | ORAL_TABLET | Freq: Three times a day (TID) | ORAL | Status: DC | PRN

## 2013-03-17 NOTE — Telephone Encounter (Signed)
Per Padonda, no refill of Vicodin but we can Rx tramadol.  Pt aware and verbalized understanding.  Allergy alert. Ok to send?

## 2013-03-17 NOTE — Telephone Encounter (Signed)
Pt needs an order for epidural shot for back pain back  Sent to [facility].  This is pt's 3rd shot.

## 2013-03-17 NOTE — Telephone Encounter (Signed)
Pt needs refill of HYDROcodone-acetaminophen (NORCO/VICODIN) 5-325 MG per  Sent to pharm: [facility]  [address]

## 2013-03-18 ENCOUNTER — Other Ambulatory Visit: Payer: Self-pay | Admitting: *Deleted

## 2013-03-18 DIAGNOSIS — M5410 Radiculopathy, site unspecified: Secondary | ICD-10-CM

## 2013-03-18 NOTE — Telephone Encounter (Signed)
done

## 2013-03-24 ENCOUNTER — Ambulatory Visit
Admission: RE | Admit: 2013-03-24 | Discharge: 2013-03-24 | Disposition: A | Discharge status: Home / Self Care | Payer: BC Managed Care – PPO | Source: Ambulatory Visit | Attending: Internal Medicine | Admitting: Internal Medicine

## 2013-03-24 VITALS — BP 123/76 | HR 77

## 2013-03-24 DIAGNOSIS — M25559 Pain in unspecified hip: Secondary | ICD-10-CM

## 2013-03-24 DIAGNOSIS — M5410 Radiculopathy, site unspecified: Secondary | ICD-10-CM

## 2013-03-24 MED ORDER — METHYLPREDNISOLONE ACETATE 40 MG/ML INJ SUSP (RADIOLOG
120.0000 mg | Freq: Once | INTRAMUSCULAR | Status: AC
  Administered 2013-03-24: 120 mg via EPIDURAL

## 2013-03-24 MED ORDER — IOHEXOL 180 MG/ML  SOLN
1.0000 mL | Freq: Once | INTRAMUSCULAR | Status: AC | PRN
  Administered 2013-03-24: 1 mL via EPIDURAL

## 2013-03-30 ENCOUNTER — Ambulatory Visit (INDEPENDENT_AMBULATORY_CARE_PROVIDER_SITE_OTHER): Payer: BC Managed Care – PPO | Admitting: Family

## 2013-03-30 ENCOUNTER — Encounter: Payer: Self-pay | Admitting: Family

## 2013-03-30 VITALS — BP 126/76 | HR 81 | Wt 187.0 lb

## 2013-03-30 DIAGNOSIS — F319 Bipolar disorder, unspecified: Secondary | ICD-10-CM

## 2013-03-30 DIAGNOSIS — IMO0001 Reserved for inherently not codable concepts without codable children: Secondary | ICD-10-CM

## 2013-03-30 DIAGNOSIS — Z23 Encounter for immunization: Secondary | ICD-10-CM

## 2013-03-30 DIAGNOSIS — E039 Hypothyroidism, unspecified: Secondary | ICD-10-CM

## 2013-03-30 MED ORDER — TRAMADOL HCL 50 MG PO TABS: 50.0000 mg | ORAL_TABLET | Freq: Three times a day (TID) | ORAL | Status: DC | PRN

## 2013-03-30 MED ORDER — METFORMIN HCL 850 MG PO TABS: 850.0000 mg | ORAL_TABLET | Freq: Two times a day (BID) | ORAL | Status: DC

## 2013-03-30 NOTE — Patient Instructions (Addendum)
1. YOU MUST SEE PSYCHIATRIST. [facility] 2. INCREASE METFOMIN TO 850MG  TWICE A DAY.  3. RECHECK IN 3 MONTHS. 4. CONTINUE CURRENT MEDS OTHERWISE

## 2013-03-31 ENCOUNTER — Encounter: Payer: Self-pay | Admitting: Family
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
## 2013-04-01 ENCOUNTER — Telehealth: Payer: Self-pay | Admitting: Internal Medicine

## 2013-04-01 NOTE — Telephone Encounter (Signed)
Pt would like a note stating she will not be returning to her current job. Pt works in [employer] and can not talk on phone due to TIA. Please fax to [phone] attn [email or web address]

## 2013-04-02 ENCOUNTER — Encounter: Payer: Self-pay | Admitting: Family

## 2013-04-14 ENCOUNTER — Other Ambulatory Visit: Payer: Self-pay | Admitting: Family

## 2013-04-19 ENCOUNTER — Encounter (HOSPITAL_COMMUNITY): Payer: Self-pay | Admitting: Emergency Medicine

## 2013-04-19 ENCOUNTER — Emergency Department (HOSPITAL_COMMUNITY)
Admission: EM | Admit: 2013-04-19 | Discharge: 2013-04-20 | Disposition: A | Discharge status: Other Acute Inpatient Hospital | Payer: BC Managed Care – PPO | Attending: Emergency Medicine | Admitting: Emergency Medicine

## 2013-04-19 ENCOUNTER — Encounter: Payer: Self-pay | Admitting: Family

## 2013-04-19 DIAGNOSIS — F411 Generalized anxiety disorder: Secondary | ICD-10-CM | POA: Insufficient documentation

## 2013-04-19 DIAGNOSIS — Z87828 Personal history of other (healed) physical injury and trauma: Secondary | ICD-10-CM | POA: Insufficient documentation

## 2013-04-19 DIAGNOSIS — Z9884 Bariatric surgery status: Secondary | ICD-10-CM | POA: Insufficient documentation

## 2013-04-19 DIAGNOSIS — Z860100 Personal history of colon polyps, unspecified: Secondary | ICD-10-CM | POA: Insufficient documentation

## 2013-04-19 DIAGNOSIS — Z791 Long term (current) use of non-steroidal anti-inflammatories (NSAID): Secondary | ICD-10-CM | POA: Insufficient documentation

## 2013-04-19 DIAGNOSIS — T43501A Poisoning by unspecified antipsychotics and neuroleptics, accidental (unintentional), initial encounter: Secondary | ICD-10-CM | POA: Insufficient documentation

## 2013-04-19 DIAGNOSIS — K219 Gastro-esophageal reflux disease without esophagitis: Secondary | ICD-10-CM | POA: Insufficient documentation

## 2013-04-19 DIAGNOSIS — R45851 Suicidal ideations: Secondary | ICD-10-CM | POA: Insufficient documentation

## 2013-04-19 DIAGNOSIS — E785 Hyperlipidemia, unspecified: Secondary | ICD-10-CM | POA: Insufficient documentation

## 2013-04-19 DIAGNOSIS — E119 Type 2 diabetes mellitus without complications: Secondary | ICD-10-CM

## 2013-04-19 DIAGNOSIS — F259 Schizoaffective disorder, unspecified: Secondary | ICD-10-CM

## 2013-04-19 DIAGNOSIS — I10 Essential (primary) hypertension: Secondary | ICD-10-CM | POA: Insufficient documentation

## 2013-04-19 DIAGNOSIS — F319 Bipolar disorder, unspecified: Secondary | ICD-10-CM

## 2013-04-19 DIAGNOSIS — E039 Hypothyroidism, unspecified: Secondary | ICD-10-CM

## 2013-04-19 DIAGNOSIS — R4789 Other speech disturbances: Secondary | ICD-10-CM | POA: Insufficient documentation

## 2013-04-19 DIAGNOSIS — F3130 Bipolar disorder, current episode depressed, mild or moderate severity, unspecified: Secondary | ICD-10-CM | POA: Insufficient documentation

## 2013-04-19 DIAGNOSIS — T43502A Poisoning by unspecified antipsychotics and neuroleptics, intentional self-harm, initial encounter: Secondary | ICD-10-CM | POA: Insufficient documentation

## 2013-04-19 DIAGNOSIS — IMO0002 Reserved for concepts with insufficient information to code with codable children: Secondary | ICD-10-CM | POA: Insufficient documentation

## 2013-04-19 DIAGNOSIS — Z79899 Other long term (current) drug therapy: Secondary | ICD-10-CM | POA: Insufficient documentation

## 2013-04-19 DIAGNOSIS — Z7982 Long term (current) use of aspirin: Secondary | ICD-10-CM | POA: Insufficient documentation

## 2013-04-19 LAB — CBC
HCT: 38.2 % (ref 36.0–46.0)
MCH: 29.7 pg (ref 26.0–34.0)
MCV: 89.5 fL (ref 78.0–100.0)
Platelets: 359 10*3/uL (ref 150–400)
RDW: 13.6 % (ref 11.5–15.5)
WBC: 9.8 10*3/uL (ref 4.0–10.5)

## 2013-04-19 LAB — RAPID URINE DRUG SCREEN, HOSP PERFORMED
Benzodiazepines: NOT DETECTED
Opiates: NOT DETECTED

## 2013-04-19 LAB — COMPREHENSIVE METABOLIC PANEL
Albumin: 3.4 g/dL — ABNORMAL LOW (ref 3.5–5.2)
BUN: 9 mg/dL (ref 6–23)
Calcium: 9 mg/dL (ref 8.4–10.5)
Chloride: 103 mEq/L (ref 96–112)
Creatinine, Ser: 0.61 mg/dL (ref 0.50–1.10)
GFR calc non Af Amer: >90 mL/min (ref >90)
Total Bilirubin: 0.2 mg/dL — ABNORMAL LOW (ref 0.3–1.2)

## 2013-04-19 LAB — ETHANOL: Alcohol, Ethyl (B): <11 mg/dL (ref 0–11)

## 2013-04-19 LAB — SALICYLATE LEVEL: Salicylate Lvl: <2 mg/dL — ABNORMAL LOW (ref 2.8–20.0)

## 2013-04-19 MED ORDER — TRAMADOL HCL 50 MG PO TABS
50.0000 mg | ORAL_TABLET | Freq: Once | ORAL | Status: AC
  Administered 2013-04-19: 50 mg via ORAL
  Filled 2013-04-19: qty 1

## 2013-04-19 MED ORDER — ACETAMINOPHEN 325 MG PO TABS
650.0000 mg | ORAL_TABLET | Freq: Once | ORAL | Status: AC
  Administered 2013-04-19: 650 mg via ORAL
  Filled 2013-04-19: qty 2

## 2013-04-19 NOTE — ED Notes (Addendum)
Per EMS: Pt is from home, family called EMS because pt was unresponsive. EMS reports the patient was unresponsive upon arrival, however she became alert in route. Pt reports taking 2 Seroquel, 1 Invega, and her usual morning medications at 10:00 am. Pt reports she took the medication because "it would be less of a burden to others," however denies HI. Pt states "the voices instructed her to take the medications."

## 2013-04-20 ENCOUNTER — Encounter (HOSPITAL_COMMUNITY): Payer: Self-pay | Admitting: *Deleted

## 2013-04-20 ENCOUNTER — Inpatient Hospital Stay (HOSPITAL_COMMUNITY)
Admission: AD | Admit: 2013-04-20 | Discharge: 2013-04-26 | DRG: 426 | Disposition: A | Discharge status: Home / Self Care | Payer: BC Managed Care – PPO | Source: Intra-hospital | Attending: Psychiatry | Admitting: Psychiatry

## 2013-04-20 ENCOUNTER — Other Ambulatory Visit: Payer: Self-pay

## 2013-04-20 ENCOUNTER — Encounter (HOSPITAL_COMMUNITY): Payer: Self-pay | Admitting: Registered Nurse

## 2013-04-20 DIAGNOSIS — F29 Unspecified psychosis not due to a substance or known physiological condition: Secondary | ICD-10-CM | POA: Diagnosis present

## 2013-04-20 DIAGNOSIS — F329 Major depressive disorder, single episode, unspecified: Principal | ICD-10-CM | POA: Diagnosis present

## 2013-04-20 DIAGNOSIS — E785 Hyperlipidemia, unspecified: Secondary | ICD-10-CM | POA: Diagnosis present

## 2013-04-20 DIAGNOSIS — J309 Allergic rhinitis, unspecified: Secondary | ICD-10-CM

## 2013-04-20 DIAGNOSIS — M545 Low back pain, unspecified: Secondary | ICD-10-CM

## 2013-04-20 DIAGNOSIS — M5430 Sciatica, unspecified side: Secondary | ICD-10-CM | POA: Diagnosis present

## 2013-04-20 DIAGNOSIS — E119 Type 2 diabetes mellitus without complications: Secondary | ICD-10-CM | POA: Diagnosis present

## 2013-04-20 DIAGNOSIS — F259 Schizoaffective disorder, unspecified: Secondary | ICD-10-CM

## 2013-04-20 DIAGNOSIS — I10 Essential (primary) hypertension: Secondary | ICD-10-CM | POA: Diagnosis present

## 2013-04-20 DIAGNOSIS — F411 Generalized anxiety disorder: Secondary | ICD-10-CM | POA: Diagnosis present

## 2013-04-20 DIAGNOSIS — K219 Gastro-esophageal reflux disease without esophagitis: Secondary | ICD-10-CM | POA: Diagnosis present

## 2013-04-20 DIAGNOSIS — IMO0001 Reserved for inherently not codable concepts without codable children: Secondary | ICD-10-CM

## 2013-04-20 DIAGNOSIS — E039 Hypothyroidism, unspecified: Secondary | ICD-10-CM | POA: Diagnosis present

## 2013-04-20 DIAGNOSIS — F3289 Other specified depressive episodes: Principal | ICD-10-CM | POA: Diagnosis present

## 2013-04-20 LAB — GLUCOSE, CAPILLARY
Glucose-Capillary: 164 mg/dL — ABNORMAL HIGH (ref 70–99)
Glucose-Capillary: 119 mg/dL — ABNORMAL HIGH (ref 70–99)
Glucose-Capillary: 116 mg/dL — ABNORMAL HIGH (ref 70–99)
Glucose-Capillary: 109 mg/dL — ABNORMAL HIGH (ref 70–99)

## 2013-04-20 MED ORDER — ACETAMINOPHEN 325 MG PO TABS: 650.0000 mg | ORAL_TABLET | ORAL | Status: DC | PRN

## 2013-04-20 MED ORDER — ASPIRIN 325 MG PO TABS
325.0000 mg | ORAL_TABLET | Freq: Every day | ORAL | Status: DC
  Administered 2013-04-21 – 2013-04-26 (×6): 325 mg via ORAL
  Filled 2013-04-20 (×7): qty 1

## 2013-04-20 MED ORDER — QUETIAPINE FUMARATE ER 50 MG PO TB24
150.0000 mg | ORAL_TABLET | Freq: Every day | ORAL | Status: DC
  Administered 2013-04-20: 150 mg via ORAL
  Filled 2013-04-20 (×2): qty 3

## 2013-04-20 MED ORDER — NICOTINE 21 MG/24HR TD PT24: 21.0000 mg | MEDICATED_PATCH | Freq: Every day | TRANSDERMAL | Status: DC

## 2013-04-20 MED ORDER — ACETAMINOPHEN 325 MG PO TABS: 650.0000 mg | ORAL_TABLET | Freq: Four times a day (QID) | ORAL | Status: DC | PRN

## 2013-04-20 MED ORDER — PREGABALIN 50 MG PO CAPS: 75.0000 mg | ORAL_CAPSULE | Freq: Two times a day (BID) | ORAL | Status: DC

## 2013-04-20 MED ORDER — BISOPROLOL-HYDROCHLOROTHIAZIDE 5-6.25 MG PO TABS
1.0000 | ORAL_TABLET | Freq: Every day | ORAL | Status: DC
  Administered 2013-04-21 – 2013-04-26 (×6): 1 via ORAL
  Filled 2013-04-20 (×7): qty 1

## 2013-04-20 MED ORDER — IBUPROFEN 600 MG PO TABS
600.0000 mg | ORAL_TABLET | Freq: Four times a day (QID) | ORAL | Status: DC | PRN
  Administered 2013-04-20: 600 mg via ORAL
  Filled 2013-04-20 (×4): qty 1

## 2013-04-20 MED ORDER — LEVOTHYROXINE SODIUM 75 MCG PO TABS
75.0000 ug | ORAL_TABLET | Freq: Every day | ORAL | Status: DC
  Administered 2013-04-20: 75 ug via ORAL
  Filled 2013-04-20 (×2): qty 1

## 2013-04-20 MED ORDER — PALIPERIDONE ER 6 MG PO TB24
6.0000 mg | ORAL_TABLET | Freq: Every day | ORAL | Status: DC
  Administered 2013-04-21 – 2013-04-24 (×4): 6 mg via ORAL
  Filled 2013-04-20 (×6): qty 1

## 2013-04-20 MED ORDER — ONDANSETRON HCL 4 MG PO TABS
4.0000 mg | ORAL_TABLET | Freq: Three times a day (TID) | ORAL | Status: DC | PRN
  Administered 2013-04-20: 4 mg via ORAL
  Filled 2013-04-20: qty 1

## 2013-04-20 MED ORDER — SIMVASTATIN 20 MG PO TABS
20.0000 mg | ORAL_TABLET | Freq: Every day | ORAL | Status: DC
  Filled 2013-04-20 (×2): qty 1

## 2013-04-20 MED ORDER — QUETIAPINE FUMARATE ER 50 MG PO TB24
150.0000 mg | ORAL_TABLET | Freq: Every day | ORAL | Status: DC
  Administered 2013-04-21: 150 mg via ORAL
  Filled 2013-04-20 (×2): qty 3

## 2013-04-20 MED ORDER — PALIPERIDONE ER 6 MG PO TB24
6.0000 mg | ORAL_TABLET | Freq: Every day | ORAL | Status: DC
  Administered 2013-04-20: 6 mg via ORAL
  Filled 2013-04-20 (×2): qty 1

## 2013-04-20 MED ORDER — BISOPROLOL-HYDROCHLOROTHIAZIDE 5-6.25 MG PO TABS: 1.0000 | ORAL_TABLET | Freq: Every day | ORAL | Status: DC

## 2013-04-20 MED ORDER — ZOLPIDEM TARTRATE 5 MG PO TABS
5.0000 mg | ORAL_TABLET | Freq: Every evening | ORAL | Status: DC | PRN
  Administered 2013-04-20: 5 mg via ORAL
  Filled 2013-04-20: qty 1

## 2013-04-20 MED ORDER — METFORMIN HCL 850 MG PO TABS
850.0000 mg | ORAL_TABLET | Freq: Two times a day (BID) | ORAL | Status: DC
  Administered 2013-04-20: 850 mg via ORAL
  Filled 2013-04-20 (×4): qty 1

## 2013-04-20 MED ORDER — PREGABALIN 75 MG PO CAPS
75.0000 mg | ORAL_CAPSULE | Freq: Two times a day (BID) | ORAL | Status: DC
  Administered 2013-04-20 – 2013-04-26 (×12): 75 mg via ORAL
  Filled 2013-04-20: qty 3
  Filled 2013-04-20: qty 1
  Filled 2013-04-20: qty 3
  Filled 2013-04-20: qty 1
  Filled 2013-04-20 (×3): qty 3
  Filled 2013-04-20: qty 1
  Filled 2013-04-20 (×2): qty 3
  Filled 2013-04-20: qty 1
  Filled 2013-04-20: qty 3
  Filled 2013-04-20 (×3): qty 1

## 2013-04-20 MED ORDER — ALUM & MAG HYDROXIDE-SIMETH 200-200-20 MG/5ML PO SUSP
30.0000 mL | ORAL | Status: DC | PRN
  Administered 2013-04-21 – 2013-04-24 (×6): 30 mL via ORAL

## 2013-04-20 MED ORDER — SIMVASTATIN 20 MG PO TABS
20.0000 mg | ORAL_TABLET | Freq: Every day | ORAL | Status: DC
  Administered 2013-04-20 – 2013-04-25 (×6): 20 mg via ORAL
  Filled 2013-04-20 (×9): qty 1

## 2013-04-20 MED ORDER — TRAZODONE HCL 50 MG PO TABS
50.0000 mg | ORAL_TABLET | Freq: Every evening | ORAL | Status: DC | PRN
  Administered 2013-04-20: 50 mg via ORAL
  Filled 2013-04-20 (×2): qty 1

## 2013-04-20 MED ORDER — LORAZEPAM 1 MG PO TABS: 1.0000 mg | ORAL_TABLET | Freq: Three times a day (TID) | ORAL | Status: DC | PRN

## 2013-04-20 MED ORDER — ALUM & MAG HYDROXIDE-SIMETH 200-200-20 MG/5ML PO SUSP: 30.0000 mL | ORAL | Status: DC | PRN

## 2013-04-20 MED ORDER — ASPIRIN 325 MG PO TABS
325.0000 mg | ORAL_TABLET | Freq: Every day | ORAL | Status: DC
  Administered 2013-04-20: 325 mg via ORAL
  Filled 2013-04-20: qty 1

## 2013-04-20 MED ORDER — IBUPROFEN 200 MG PO TABS: 600.0000 mg | ORAL_TABLET | Freq: Three times a day (TID) | ORAL | Status: DC | PRN

## 2013-04-20 MED ORDER — BISOPROLOL-HYDROCHLOROTHIAZIDE 5-6.25 MG PO TABS
1.0000 | ORAL_TABLET | Freq: Every day | ORAL | Status: DC
  Administered 2013-04-20: 1 via ORAL
  Filled 2013-04-20 (×2): qty 1

## 2013-04-20 MED ORDER — LEVOTHYROXINE SODIUM 150 MCG PO TABS
150.0000 ug | ORAL_TABLET | Freq: Every day | ORAL | Status: DC
  Administered 2013-04-21 – 2013-04-26 (×6): 150 ug via ORAL
  Filled 2013-04-20: qty 2
  Filled 2013-04-20 (×3): qty 1
  Filled 2013-04-20: qty 2
  Filled 2013-04-20 (×4): qty 1

## 2013-04-20 MED ORDER — MAGNESIUM HYDROXIDE 400 MG/5ML PO SUSP
30.0000 mL | Freq: Every day | ORAL | Status: DC | PRN
  Administered 2013-04-25: 30 mL via ORAL

## 2013-04-20 MED ORDER — METFORMIN HCL 850 MG PO TABS
850.0000 mg | ORAL_TABLET | Freq: Two times a day (BID) | ORAL | Status: DC
  Administered 2013-04-20 – 2013-04-26 (×12): 850 mg via ORAL
  Filled 2013-04-20 (×15): qty 1
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
## 2013-04-20 NOTE — ED Provider Notes (Signed)
Medical screening examination/treatment/procedure(s) were performed by non-physician practitioner and as supervising physician I was immediately available for consultation/collaboration.  Derwood Kaplan, MD 04/20/13 1505

## 2013-04-20 NOTE — Progress Notes (Signed)
D:Patient appeared sad and depressed at the beginning of this shift. She reported feeling pain and nauseous. Also requested for sleep medication at HS. Patient denied SI/HI and denied Hallucinations.  A: Writer notified NP Kandee Keen about patient's complaints. She ordered Ibuprofen 600 mg for pain and Trazodone for sleep. Encouraged and supported patient. Offered HS medication without difficulty. R: Patient received HS medications without difficulty. Q 15 minute check continues as ordered to maintain safety.

## 2013-04-20 NOTE — ED Notes (Signed)
Telepsych in progress. 

## 2013-04-20 NOTE — ED Notes (Signed)
Report given to [place] pt to room 42 in [facility]

## 2013-04-20 NOTE — Consult Note (Signed)
Evaluation for inpatient treatment; Face to face interview and consulted with Dr.  Referred by:  EDP   HPI:  Patient states that she is here at hospital because "I was having suicidal thoughts."  Patient states that she is hearing voices.  States that the voices are mostly "chatter" but yesterday the voices started to "tell me which pills to take to kill myself.  I took the pills that the voices told me; but they didn't work; cause I woke up when the EMS came.  Patient states that she continues to have suicidal thoughts.  Patient denies homicidal ideation and paranoia.   @ Axis I: Schizoaffective Disorder Axis II: Deferred Axis III:  Past Medical History  Diagnosis Date  . GERD (gastroesophageal reflux disease)     prn med.  . Hypothyroidism   . Hypertension     new dx. - will start med. 06/28/2011  . Sleep apnea     prior to gastric bypass - no sleep study since bypass  . Rotator cuff tear, right   . Diabetes mellitus   . Hyperlipidemia   . Hiatal hernia 1998  . Hyperplastic colon polyp   . Fatty liver   . Hyperthyroidism    Axis IV: other psychosocial or environmental problems Axis V: 41-50 serious symptoms  @Psychiatric  Specialty Exam: @PHYSEXAMBYAGE2 @  @ROS @  Blood pressure 103/62, pulse 83, temperature 98 F (36.7 C), temperature source Oral, resp. rate 14, SpO2 96.00%.There is no weight on file to calculate BMI.  General Appearance: Casual  Eye Contact::  Good  Speech:  Clear and Coherent and Normal Rate  Volume:  Normal  Mood:  Anxious and Depressed  Affect:  Depressed  Thought Process:  Circumstantial  Orientation:  Full (Time, Place, and Person)  Thought Content:  Rumination  Suicidal Thoughts:  Yes.  with intent/plan  Homicidal Thoughts:  No  Memory:  Immediate;   Good Recent;   Good Remote;   Good  Judgement:  Impaired  Insight:  Fair  Psychomotor Activity:  Normal  Concentration:  Fair  Recall:  Good  Akathisia:  No  Handed:  Right  AIMS (if  indicated):     Assets:  Communication Skills Desire for Improvement  Sleep:      Face to face interview and consult with Dr. Lucianne Muss Plan: Disposition:  Inpatient treatment; Patient accepted to [facility] 400 hall 1. Admit for crisis management and stabilization.  2. Review and initiate  medications pertinent to patient illness and treatment.  3. Medication management to reduce current symptoms to base line and improve the         patient's overall level of functioning.

## 2013-04-20 NOTE — ED Notes (Signed)
Report called to [facility], RN, [facility] ready for pt.

## 2013-04-20 NOTE — ED Notes (Signed)
Pt given two apple juice and a cup of ice

## 2013-04-20 NOTE — ED Provider Notes (Signed)
CSN: [id number]     Arrival date & time 04/19/13  1759 History   First MD Initiated Contact with Patient 04/19/13 1816     Chief Complaint  Patient presents with  . SI   . Medical Clearance  . Drug Overdose   (Consider location/radiation/quality/duration/timing/severity/associated sxs/prior Treatment) HPI Patient presents emergency department with hallucinations.  Patient, states, that she took medics.  Patient to try to harm her self.  Patient, states, that she's hearing voices and the only thing that stops them is TV and radio.  Patient denies chest pain, shortness breath, fever, nausea, vomiting, headache, blurred vision, abdominal pain, or syncope    Past Medical History  Diagnosis Date  . GERD (gastroesophageal reflux disease)     prn med.  . Hypothyroidism   . Hypertension     new dx. - will start med. 06/28/2011  . Sleep apnea     prior to gastric bypass - no sleep study since bypass  . Rotator cuff tear, right   . Diabetes mellitus   . Hyperlipidemia   . Hiatal hernia 1998  . Hyperplastic colon polyp   . Fatty liver   . Hyperthyroidism    Past Surgical History  Procedure Laterality Date  . Cesarean section  1991  . Cholecystectomy  1989  . Shoulder arthroscopy distal clavicle excision and open rotator cuff repair  07/26/2010    right  . Shoulder arthroscopy  01/02/2006; 05/31/2004    left 2007; right 2005  . Exploratory laparotomy  05/08/2000    right salpingo-oophorectomy  . Abdominal hysterectomy  06/25/2000    left salpingo-oophorectomy  . 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 Laminotomy / excision disk posterior cervical spine  08/10/2009    C7-T1  . Thyroid surgery  early 2000s    goiter  . Cardiac catheterization  04/01/2006  . Shoulder arthroscopy  07/02/2011    Procedure: ARTHROSCOPY SHOULDER;  Surgeon: Wyn Forster., MD;  Location: [facility];  Service: Orthopedics;  Laterality: Right;  repair supraspinatus, Debride glenohumeral  joint, labrum  . Back surgery  1997    discetomy, lumbar spinal fusion  . Tubal ligation     Family History  Problem Relation Age of Onset  . Coronary artery disease     History  Substance Use Topics  . Smoking status: Never Smoker   . Smokeless tobacco: Never Used  . Alcohol Use: No   OB History   Grav Para Term Preterm Abortions TAB SAB Ect Mult Living                 Review of Systems All other systems negative except as documented in the HPI. All pertinent positives and negatives as reviewed in the HPI. Allergies  Shrimp flavor; Ace inhibitors; and Oxycodone-acetaminophen  Home Medications   Current Outpatient Rx  Name  Route  Sig  Dispense  Refill  . aspirin 325 MG tablet   Oral   Take 1 tablet (325 mg total) by mouth daily.   30 tablet   3   . bisoprolol-hydrochlorothiazide (ZIAC) 5-6.25 MG per tablet   Oral   Take 1 tablet by mouth daily.         . clonazePAM (KLONOPIN) 0.5 MG tablet   Oral   Take 1 tablet (0.5 mg total) by mouth 2 (two) times daily.   60 tablet   0   . diclofenac sodium (VOLTAREN) 1 % GEL   Topical   Apply 1 application topically 4 (  four) times daily.   100 g   0   . fluticasone (FLONASE) 50 MCG/ACT nasal spray   Nasal   Place 2 sprays into the nose daily.   16 g   6   . HYDROcodone-acetaminophen (NORCO/VICODIN) 5-325 MG per tablet   Oral   Take 1 tablet by mouth every 8 (eight) hours as needed for pain.   60 tablet   1   . levothyroxine (SYNTHROID, LEVOTHROID) 150 MCG tablet      TAKE 1 TABLET EVERY DAY   90 tablet   3   . lidocaine (LIDODERM) 5 %      PLACE 3 PATCHES ONTO THE SKIN DAILY. REMOVE & DISCARD PATCH WITHIN 12 HOURS OR AS DIRECTED BY MD   90 patch   3   . meclizine (ANTIVERT) 25 MG tablet   Oral   Take 1 tablet (25 mg total) by mouth 4 (four) times daily.   28 tablet   0   . metFORMIN (GLUCOPHAGE) 850 MG tablet   Oral   Take 1 tablet (850 mg total) by mouth 2 (two) times daily with a meal.   180  tablet   3   . nitroGLYCERIN (NITROSTAT) 0.4 MG SL tablet   Sublingual   Place 1 tablet (0.4 mg total) under the tongue every 5 (five) minutes as needed for chest pain (CP or SOB).   60 tablet   12   . paliperidone (INVEGA) 6 MG 24 hr tablet   Oral   Take 6 mg by mouth every morning.         . pregabalin (LYRICA) 75 MG capsule   Oral   Take 75 mg by mouth 2 (two) times daily.         . QUEtiapine Fumarate (SEROQUEL XR) 150 MG 24 hr tablet      1 tab daily.   30 tablet   2   . simvastatin (ZOCOR) 20 MG tablet   Oral   Take 1 tablet (20 mg total) by mouth at bedtime.   90 tablet   3   . traMADol (ULTRAM) 50 MG tablet   Oral   Take 1 tablet (50 mg total) by mouth every 8 (eight) hours as needed for pain.   90 tablet   0    BP 108/63  Pulse 81  Temp(Src) 98 F (36.7 C) (Oral)  Resp 18  SpO2 94% Physical Exam  Constitutional: She appears well-developed and well-nourished. No distress.  HENT:  Head: Normocephalic and atraumatic.  Mouth/Throat: Oropharynx is clear and moist.  Eyes: Pupils are equal, round, and reactive to light.  Neck: Normal range of motion. Neck supple.  Cardiovascular: Normal rate, regular rhythm and normal heart sounds.  Exam reveals no gallop and no friction rub.   No murmur heard. Pulmonary/Chest: Effort normal and breath sounds normal. No respiratory distress.  Skin: Skin is warm and dry.  Psychiatric: Her mood appears anxious. Her affect is not angry. Her speech is rapid and/or pressured, delayed and slurred. She is actively hallucinating. Thought content is not paranoid and not delusional. She expresses impulsivity. She exhibits a depressed mood. She expresses suicidal ideation. She expresses no homicidal ideation. She expresses suicidal plans. She expresses no homicidal plans.    ED Course  Procedures (including critical care time) Labs Review Labs Reviewed  COMPREHENSIVE METABOLIC PANEL - Abnormal; Notable for the following:     Glucose, Bld 132 (*)    Albumin 3.4 (*)  Total Bilirubin 0.2 (*)    All other components within normal limits  SALICYLATE LEVEL - Abnormal; Notable for the following:    Salicylate Lvl <2.0 (*)    All other components within normal limits  ACETAMINOPHEN LEVEL  CBC  ETHANOL  URINE RAPID DRUG SCREEN (HOSP PERFORMED)   patient will need psychiatric placement and assessment.  She is stable at this time MDM      Carlyle Dolly, PA-C 04/20/13 0122

## 2013-04-20 NOTE — Progress Notes (Signed)
Patient ID: Alexandra [name], female   DOB: 1961-04-01, 52 y.o.   MRN: [id number] Patient admitted to [facility] to attempted suicide attempt.  Patient was brought to [facility] by EMS after son notified.  Patient took about 3 of her seroquest in addition to her medications.  She stated, "everyone would be better off without me."  Patient has been feeling suicidal for over a week.  She reports decreased sleep, hopelessness and chronic crying spells.  She reports hearing voices all the time.  Patient reports that she sees [facility] as outpatient.  She is also followed by [facility].  She reports this is first psyche admission for her.  She lives with her husband, children and 1 grandchild.  She does not indicate that her husband is very supportive.  At this time, he is not aware that she is here.  Patient remains with passive SI, however can contract for safety.  Her medical hx includes diabetes, HTN and back, neck and abdominal surgery.  Patient was hospitalized at [facility] earlier this year for TIA.  She presents with sad depressed affect.  She was oriented to room and unit.

## 2013-04-20 NOTE — Progress Notes (Signed)
Adult Psychoeducational Group Note  Date:  04/20/2013 Time:  10:13 PM  Group Topic/Focus:  Wrap-Up Group:   The focus of this group is to help patients review their daily goal of treatment and discuss progress on daily workbooks.  Participation Level:  Minimal  Participation Quality:  Appropriate  Affect:  Appropriate  Cognitive:  Appropriate  Insight: Appropriate  Engagement in Group:  Engaged  Modes of Intervention:  Support  Additional Comments:  Patient attended and participated in group today. She reports that she came in today. She felt that the intake process can be improved by not having the patients sit with visitors in the lobby. Today her family visited with her.  Alexandra [name] [facility] 04/20/2013, 10:13 PM

## 2013-04-20 NOTE — Tx Team (Signed)
  Interdisciplinary Treatment Plan Update   Date Reviewed:  04/20/2013  Time Reviewed:  4:55 PM  Progress in Treatment:   Attending groups: Yes Participating in groups: Yes Taking medication as prescribed: Yes  Tolerating medication: Yes Family/Significant other contact made: No Patient understands diagnosis: Yes  As evidenced by asking for help with depression Discussing patient identified problems/goals with staff: Yes  See initial plan Medical problems stabilized or resolved: Yes Denies suicidal/homicidal ideation: Yes  In tx team Patient has not harmed self or others: Yes  For review of initial/current patient goals, please see plan of care.  Estimated Length of Stay:  4-5 days  Reason for Continuation of Hospitalization: Depression Hallucinations Medication stabilization Suicidal ideation  New Problems/Goals identified:  N/A  Discharge Plan or Barriers:   return home, follow up outpt  Additional Comments:  Patient was brought in by EMS after son called them. Patient had recently (on 09/10) had her Seroquel discontinued and Invega was started. Patient tonight had taken about 3 of the Seroquel in addition to her other medications. She said, "I wanted to sleep and never wake up." Patient feels that she is a burden on others "they would be better off if I was not around." Patient was having her psychiatric meds prescribed by Dr. Orvan Falconer (her pcp) and that doctor referred her to [facility]. The psychiatrist at [facility] had made the medication change on 09/10. Patient said that she has been feeling suicidal for a week. She hears voices all the time but said over the last week they have been more clear and intense. She puts in earbuds or turns on the television loudly but she still hears them.    Attendees:  Signature: Thedore Mins, MD 04/20/2013 4:55 PM   Signature: Richelle Ito, LCSW 04/20/2013 4:55 PM  Signature: Fransisca Kaufmann, NP 04/20/2013 4:55 PM  Signature: Joslyn Devon, RN  04/20/2013 4:55 PM  Signature: Liborio Nixon, RN 04/20/2013 4:55 PM  Signature:  04/20/2013 4:55 PM  Signature:   04/20/2013 4:55 PM  Signature:    Signature:    Signature:    Signature:    Signature:    Signature:      Scribe for Treatment Team:   Richelle Ito, LCSW  04/20/2013 4:55 PM

## 2013-04-21 DIAGNOSIS — F259 Schizoaffective disorder, unspecified: Secondary | ICD-10-CM

## 2013-04-21 LAB — GLUCOSE, CAPILLARY
Glucose-Capillary: 160 mg/dL — ABNORMAL HIGH (ref 70–99)
Glucose-Capillary: 98 mg/dL (ref 70–99)
Glucose-Capillary: 172 mg/dL — ABNORMAL HIGH (ref 70–99)

## 2013-04-21 MED ORDER — ACETAMINOPHEN 325 MG PO TABS
650.0000 mg | ORAL_TABLET | Freq: Four times a day (QID) | ORAL | Status: DC | PRN
  Administered 2013-04-22 – 2013-04-26 (×11): 650 mg via ORAL

## 2013-04-21 MED ORDER — NAPROXEN 375 MG PO TABS
375.0000 mg | ORAL_TABLET | Freq: Two times a day (BID) | ORAL | Status: DC
  Filled 2013-04-21 (×4): qty 1

## 2013-04-21 MED ORDER — BENZTROPINE MESYLATE 1 MG PO TABS
1.0000 mg | ORAL_TABLET | Freq: Every day | ORAL | Status: DC
  Administered 2013-04-21 – 2013-04-26 (×6): 1 mg via ORAL
  Filled 2013-04-21 (×7): qty 1

## 2013-04-21 MED ORDER — TRAZODONE HCL 100 MG PO TABS
100.0000 mg | ORAL_TABLET | Freq: Every evening | ORAL | Status: DC | PRN
  Administered 2013-04-21 – 2013-04-24 (×4): 100 mg via ORAL
  Filled 2013-04-21 (×4): qty 1
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
## 2013-04-21 NOTE — Progress Notes (Signed)
Recreation Therapy Notes  Date: 09.24.2014 Time: 9:30am Location: [address] Dayroom  Group Topic: Coping Skills  Goal Area(s) Addresses:  Patient will be able to identify coping skills. Patient will work effectively with group members.   Behavioral Response: Engaged, Attentive, Appropraite  Intervention: Game  Activity: Scientist, water quality. Patients were divided in teams of two. As part of a team patients were asked to answer various questions about coping skills.  Questions were in categories and were given an individual point value.  Education: Pharmacologist, Discharge Planning  Education Outcome: Needs additional education.   Clinical Observations/Feedback: Patient wandered in and out of group session. While patient was in group session she actively engaged in activity, selecting questions and answering appropriately.  Marykay Lex Temiloluwa [place], LRT/CTRS  Dannell Gortney L 04/21/2013 4:00 PM

## 2013-04-21 NOTE — [facility] Suicide Risk Assessment (Signed)
Suicide Risk Assessment  Admission Assessment     Nursing information obtained from:    Demographic factors:    Current Mental Status:    Loss Factors:    Historical Factors:    Risk Reduction Factors:     CLINICAL FACTORS:   Bipolar Disorder:   Depressive phase Schizophrenia:   Command hallucinatons Currently Psychotic Unstable or Poor Therapeutic Relationship  COGNITIVE FEATURES THAT CONTRIBUTE TO RISK:  Closed-mindedness Polarized thinking    SUICIDE RISK:   Mild:  Suicidal ideation of limited frequency, intensity, duration, and specificity.  There are no identifiable plans, no associated intent, mild dysphoria and related symptoms, good self-control (both objective and subjective assessment), few other risk factors, and identifiable protective factors, including available and accessible social support.  PLAN OF CARE:1. Admit for crisis management and stabilization. 2. Medication management to reduce current symptoms to base line and improve the     patient's overall level of functioning 3. Treat health problems as indicated. 4. Develop treatment plan to decrease risk of relapse upon discharge and the need for     readmission. 5. Psycho-social education regarding relapse prevention and self care. 6. Health care follow up as needed for medical problems. 7. Restart home medications where appropriate.   I certify that inpatient services furnished can reasonably be expected to improve the patient's condition.  Thedore Mins, MD 04/21/2013, 10:43 AM

## 2013-04-21 NOTE — [facility] Group Notes (Signed)
[facility] [employer] Group Therapy  04/21/2013 , 2:49 PM    Type of Therapy:  [employer] Presentation  Participation Level:  Minimal  Participation Quality:  Drowsey  Affect:  Depressed  Cognitive:  Oriented  Insight:  Limited  Engagement in Therapy:  None  Modes of Intervention:  Discussion, Education and Socialization  Summary of Progress/Problems:  Onalee Hua from [employer] came to present his recovery story and play the guitar.  Alexandra [name] started out in group-then was called out to see her nurse.  She returned and slept the rest of the time.  Apologized that she had been so sleepy.  York Spaniel it was due to medication.  Daryel Gerald B 04/21/2013 , 2:49 PM

## 2013-04-21 NOTE — [facility] Group Notes (Signed)
[facility] LCSW Aftercare Discharge Planning Group Note   04/21/2013 9:21 AM  Participation Quality:  Engaged  Mood/Affect:  Depressed  Depression Rating:  8  Anxiety Rating:  6  Thoughts of Suicide:  No Will you contract for safety?   NA  Current AVH:  No  Plan for Discharge/Comments:  Alexandra [name] admits to taking an OD of her medication "because the voices were telling me to do it."  Denies past history of psychosis, but states she is a patient at [facility] and has a therapist as well.  Lives with husband, son, daughter and grandaughter.  No identified acute stressors.  "There's nothing new-everyday is the same."  I pointed out it seems every day is now when there is a 52 year old in the house.  She smiled and agreed.  Transportation Means: family  Supports: family  [place], [name]
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
## 2013-04-21 NOTE — Progress Notes (Signed)
The focus of this group is to help patients review their daily goal of treatment and discuss progress on daily workbooks. Pt attended the evening group session and responded to all discussion prompts from the Writer. Pt reported having a good day on the unit, the highlights of which were visits from her father and best friend. She shared that she felt supported by them, especially following visits like these. Pt reported having no additional needs from Nursing Staff this evening beyond towels, which were given to her following group. Pt's affect was depressed.

## 2013-04-21 NOTE — Progress Notes (Signed)
Patient ID: Alexandra [name], female   DOB: June 07, 1961, 52 y.o.   MRN: [id number] D:Patient presents with flat, blunted affect.  Her mood is sad and depressed.  She expresses passive SI and continuously hears voices.  Patient stated that the voices told her "Everyone would better off without you."  Patient also complains of sciatic nerve pain and she has ibuprofen ordered.  Patient stated that "it really doesn't help."  She attended group and participated.  Patient denies any HI.  She is interacting well with staff.  She is cooperative. A:Continue to monitor medication management and MD orders.  Safety checks completed every 15 minutes per protocol. R:Patient's behavior is appropriate.
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
## 2013-04-22 LAB — GLUCOSE, CAPILLARY
Glucose-Capillary: 132 mg/dL — ABNORMAL HIGH (ref 70–99)
Glucose-Capillary: 144 mg/dL — ABNORMAL HIGH (ref 70–99)

## 2013-04-22 NOTE — Progress Notes (Signed)
[facility] MD Progress Note  04/22/2013 9:54 AM Alexandra [name]  MRN:  [id number] Subjective: "I am still hearing voices and my sciatica is bordering me." Objective: Patient reports that she is still feeling depressed, hopeless, suicidal and hearing voices. She is concerned about her inability to find a job and her her dwindling financial resources. Patient denies delusional and homicidal thoughts. She is compliant with her medication and has not verbalized any adverse reactions. Diagnosis:   DSM5: Schizophrenia Disorders:  Schizophrenia (295.7) Obsessive-Compulsive Disorders:  denies Trauma-Stressor Disorders:  denies Substance/Addictive Disorders:  denies Depressive Disorders:  Major Depressive Disorder - Severe (296.23)  Axis I: Schizoaffective Disorder-depressive disorder  ADL's:  Intact  Sleep: Fair  Appetite:  Fair  Suicidal Ideation: yes Plan:  denies Intent:  denies Means:  denies Homicidal Ideation: denies  AEB (as evidenced by): as evidence by patient's reported  Psychiatric Specialty Exam: Review of Systems  Constitutional: Negative.   HENT: Negative.   Eyes: Negative.   Respiratory: Negative.   Cardiovascular: Negative.   Gastrointestinal: Negative.   Genitourinary: Negative.   Musculoskeletal: Positive for joint pain.  Skin: Negative.   Neurological: Negative.   Endo/Heme/Allergies: Negative.   Psychiatric/Behavioral: Positive for depression and suicidal ideas. The patient is nervous/anxious and has insomnia.     Blood pressure 111/76, pulse 89, temperature 97.8 F (36.6 C), temperature source Oral, resp. rate 20, height 5' 1.5" (1.562 m), weight 86.183 kg (190 lb).Body mass index is 35.32 kg/(m^2).  General Appearance: Fairly Groomed  Patent attorney::  Fair  Speech:  Clear and Coherent  Volume:  Normal  Mood:  Depressed and Hopeless  Affect:  Constricted  Thought Process:  Goal Directed  Orientation:  Full (Time, Place, and Person)  Thought Content:   Hallucinations: Auditory  Suicidal Thoughts:  Yes.  without intent/plan  Homicidal Thoughts:  No  Memory:  Immediate;   Fair Recent;   Fair Remote;   Fair  Judgement:  Impaired  Insight:  Shallow  Psychomotor Activity:  Decreased  Concentration:  Fair  Recall:  Fair  Akathisia:  No  Handed:  Right  AIMS (if indicated):     Assets:  Communication Skills Desire for Improvement Social Support  Sleep:  Number of Hours: 6   Current Medications: Current Facility-Administered Medications  Medication Dose Route Frequency Provider Last Rate Last Dose  . acetaminophen (TYLENOL) tablet 650 mg  650 mg Oral Q6H PRN Audrea Muscat, NP   650 mg at 04/22/13 0951  . alum & mag hydroxide-simeth (MAALOX/MYLANTA) 200-200-20 MG/5ML suspension 30 mL  30 mL Oral Q4H PRN Shuvon Rankin, NP   30 mL at 04/22/13 0951  . aspirin tablet 325 mg  325 mg Oral Daily Shuvon Rankin, NP   325 mg at 04/22/13 0756  . benztropine (COGENTIN) tablet 1 mg  1 mg Oral Daily Eugina Row   1 mg at 04/22/13 0754  . bisoprolol-hydrochlorothiazide (ZIAC) 5-6.25 MG per tablet 1 tablet  1 tablet Oral Daily Shuvon Rankin, NP   1 tablet at 04/22/13 0756  . ibuprofen (ADVIL,MOTRIN) tablet 600 mg  600 mg Oral Q6H PRN Adysson Revelle   600 mg at 04/20/13 2156  . levothyroxine (SYNTHROID, LEVOTHROID) tablet 150 mcg  150 mcg Oral QAC breakfast Shuvon Rankin, NP   150 mcg at 04/22/13 0755  . magnesium hydroxide (MILK OF MAGNESIA) suspension 30 mL  30 mL Oral Daily PRN Shuvon Rankin, NP      . metFORMIN (GLUCOPHAGE) tablet 850 mg  850 mg Oral  BID WC Shuvon Rankin, NP   850 mg at 04/22/13 0755  . paliperidone (INVEGA) 24 hr tablet 6 mg  6 mg Oral Daily Shuvon Rankin, NP   6 mg at 04/22/13 0756  . pregabalin (LYRICA) capsule 75 mg  75 mg Oral BID Shuvon Rankin, NP   75 mg at 04/22/13 0755  . simvastatin (ZOCOR) tablet 20 mg  20 mg Oral QHS Shuvon Rankin, NP   20 mg at 04/21/13 2126  . traZODone (DESYREL) tablet 100 mg  100 mg Oral QHS  PRN,MR X 1 Sheva Mcdougle   100 mg at 04/21/13 2126    Lab Results:  Results for orders placed during the hospital encounter of 04/20/13 (from the past 48 hour(s))  GLUCOSE, CAPILLARY     Status: Abnormal   Collection Time    04/20/13  5:04 PM      Result Value Range   Glucose-Capillary 116 (*) 70 - 99 mg/dL  GLUCOSE, CAPILLARY     Status: Abnormal   Collection Time    04/20/13  9:04 PM      Result Value Range   Glucose-Capillary 109 (*) 70 - 99 mg/dL  GLUCOSE, CAPILLARY     Status: Abnormal   Collection Time    04/21/13  6:18 AM      Result Value Range   Glucose-Capillary 160 (*) 70 - 99 mg/dL  GLUCOSE, CAPILLARY     Status: None   Collection Time    04/21/13 12:09 PM      Result Value Range   Glucose-Capillary 98  70 - 99 mg/dL  GLUCOSE, CAPILLARY     Status: Abnormal   Collection Time    04/21/13  4:48 PM      Result Value Range   Glucose-Capillary 172 (*) 70 - 99 mg/dL   Comment 1 Notify RN    GLUCOSE, CAPILLARY     Status: Abnormal   Collection Time    04/21/13  8:40 PM      Result Value Range   Glucose-Capillary 132 (*) 70 - 99 mg/dL   Comment 1 Notify RN    GLUCOSE, CAPILLARY     Status: Abnormal   Collection Time    04/22/13  7:35 AM      Result Value Range   Glucose-Capillary 270 (*) 70 - 99 mg/dL    Physical Findings: AIMS: Facial and Oral Movements Muscles of Facial Expression: None, normal Lips and Perioral Area: None, normal Jaw: None, normal Tongue: None, normal,Extremity Movements Upper (arms, wrists, hands, fingers): None, normal Lower (legs, knees, ankles, toes): None, normal, Trunk Movements Neck, shoulders, hips: None, normal, Overall Severity Severity of abnormal movements (highest score from questions above): None, normal Incapacitation due to abnormal movements: None, normal Patient's awareness of abnormal movements (rate only patient's report): No Awareness, Dental Status Current problems with teeth and/or dentures?: No Does patient  usually wear dentures?: No  CIWA:    COWS:     Treatment Plan Summary: Daily contact with patient to assess and evaluate symptoms and progress in treatment Medication management  Plan:1. Admit for crisis management and stabilization. 2. Medication management to reduce current symptoms to base line and improve the     patient's overall level of functioning 3. Treat health problems as indicated. 4. Develop treatment plan to decrease risk of relapse upon discharge and the need for     readmission. 5. Psycho-social education regarding relapse prevention and self care. 6. Health care follow up as needed for medical problems.  7. Restart home medications where appropriate.   Medical Decision Making Problem Points:  Established problem, improving (1), Review of last therapy session (1) and Review of psycho-social stressors (1) Data Points:  Order Aims Assessment (2) Review of medication regiment & side effects (2) Review of new medications or change in dosage (2)  I certify that inpatient services furnished can reasonably be expected to improve the patient's condition.   Thedore Mins, MD 04/22/2013, 9:54 AM

## 2013-04-22 NOTE — Progress Notes (Signed)
Pt participated in group today and went to [employer]

## 2013-04-22 NOTE — Progress Notes (Signed)
D:Pt reports that she has not had hallucinations today. She is distracting herself by being with peers in the dayroom. She reports that she had some hallucinations yesterday of seeing a cloud and hearing chattering. She rates her hopelessness as a 6 on 1-10 scale with 10 being the most hopeless.  A:Supported pt to discuss feelings. Offered encouragement and 15 minute checks. R:Pt denies si and hi. Safety maintained on the unit.

## 2013-04-22 NOTE — [facility] Suicide Risk Assessment (Signed)
[facility] INPATIENT:  Family/Significant Other Suicide Prevention Education  Suicide Prevention Education:  Education Completed; [phone], Alinda Money (Husband) has been identified by the patient as the family member/significant other with whom the patient will be residing, and identified as the person(s) who will aid the patient in the event of a mental health crisis (suicidal ideations/suicide attempt).  With written consent from the patient, the family member/significant other has been provided the following suicide prevention education, prior to the and/or following the discharge of the patient.  The suicide prevention education provided includes the following:  Suicide risk factors  Suicide prevention and interventions  National Suicide Hotline telephone number  [facility] assessment telephone number  [facility] Emergency Assistance 911  [facility] and/or Residential Mobile Crisis Unit telephone number  Request made of family/significant other to:  Remove weapons (e.g., guns, rifles, knives), all items previously/currently identified as safety concern.    Remove drugs/medications (over-the-counter, prescriptions, illicit drugs), all items previously/currently identified as a safety concern.  The family member/significant other verbalizes understanding of the suicide prevention education information provided.  The family member/significant other agrees to remove the items of safety concern listed above.  Does have guns at the home, but guns are locked up.  Adreona does not have access to them.    Simona Huh 04/22/2013, 10:18 AM

## 2013-04-22 NOTE — [facility] Group Notes (Signed)
[facility] Group Notes:  (Counselor/Nursing/MHT/Case Management/Adjunct)  04/22/2013 1:15PM  Type of Therapy:  Group Therapy  Participation Level:  Active  Participation Quality:  Appropriate  Affect:  Flat  Cognitive:  Oriented  Insight:  Improving  Engagement in Group:  Limited  Engagement in Therapy:  Limited  Modes of Intervention:  Discussion, Exploration and Socialization  Summary of Progress/Problems: The topic for group was balance in life.  Pt participated in the discussion about when their life was in balance and out of balance and how this feels.  Pt discussed ways to get back in balance and short term goals they can work on to get where they want to be.  Alexandra [name] was appropriate throughout group.  Alexandra [name] apologized for the lack of energy yesterday due to her medicine.   Stated that she feels balanced.  She indicated that she has felt hopeless at times but can find balance down when she has a [place] to nuture.     Alexandra [name] B 04/22/2013 2:20 PM

## 2013-04-22 NOTE — Progress Notes (Signed)
D: Pt is appropriate in affect and mood. She does endorse having some anxiety during the course of the day due to a maniac pt within the milieu. Staff made aware of this pt's stressor. She reports visual hallucinations of seeing a cloud. Pt reports feeling less groggy today with the lack of Seroquel in her system. Pt was informed that her Seroquel has been discontinued and not scheduled to be given this evening. Pt was receptive to the information. Pt attended group this evening. Pt observed interacting appropriately within the milieu.  A: Writer administered scheduled and prn medications to pt. Continued support and availability as needed was extended to this pt. Staff continue to monitor pt with q9min checks.  R: No adverse drug reactions noted. Pt receptive to treatment. Pt remains safe at this time.

## 2013-04-22 NOTE — Consult Note (Signed)
Patient needs inpatient treatment 

## 2013-04-23 DIAGNOSIS — F329 Major depressive disorder, single episode, unspecified: Principal | ICD-10-CM

## 2013-04-23 DIAGNOSIS — F3289 Other specified depressive episodes: Principal | ICD-10-CM

## 2013-04-23 LAB — GLUCOSE, CAPILLARY
Glucose-Capillary: 138 mg/dL — ABNORMAL HIGH (ref 70–99)
Glucose-Capillary: 144 mg/dL — ABNORMAL HIGH (ref 70–99)
Glucose-Capillary: 176 mg/dL — ABNORMAL HIGH (ref 70–99)
Glucose-Capillary: 99 mg/dL (ref 70–99)

## 2013-04-23 NOTE — Progress Notes (Signed)
D Pt is seen OOB UAL on the 400 hall today.Marland KitchenMarland KitchenShe is pleasant, calm and cooperative. She takes her medications as ordered by her MD and she attends her groups as scheduled.  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  R Safety is in place and POC moves forward.

## 2013-04-23 NOTE — [facility] Group Notes (Signed)
[facility] LCSW Aftercare Discharge Planning Group Note   04/23/2013 7:52 AM  Participation Quality:  Engaged  Mood/Affect:  Depressed  Depression Rating:  3  Anxiety Rating:  "increasing"  Thoughts of Suicide:  No Will you contract for safety?   NA  Current AVH:  No  Plan for Discharge/Comments:  Alexandra [name] is progressing.  She smiled this AM, and her affect is brighter.  She is OK with being here thru the weekend but is hoping to be able to get to the appointment with her therapist on Tuesday.  Transportation Means:  family  Supports:  family  [place], [name]

## 2013-04-23 NOTE — [facility] Group Notes (Signed)
[facility] LCSW Group Therapy  04/23/2013  1:05 PM  Type of Therapy:  Group therapy  Participation Level:  Active  Participation Quality:  Attentive  Affect:  Flat  Cognitive:  Oriented  Insight:  Limited  Engagement in Therapy:  Limited  Modes of Intervention:  Discussion, Socialization  Summary of Progress/Problems:  Chaplain was here to lead a group on themes of hope and courage. "Courage is being able to tackle your fears.  It builds strength."  States that sometimes she feels like others would be better off without here.  She made it clear that her family does not "make"  her feel that way.  She could not be convinced by the group that her grandaughter benefits greatly from her presence.  "Every spider is a new spider."  We translated that into every fear is a new fear that needs be dealt with.  Appeared to be a bit brighter at the end of group. Daryel Gerald B 04/23/2013 1:00 PM

## 2013-04-23 NOTE — Progress Notes (Signed)
Patient ID: Alexandra [name], female   DOB: 1961-06-14, 52 y.o.   MRN: [id number] [facility] MD Progress Note  04/23/2013 11:33 AM Alexandra [name]  MRN:  [id number] Subjective: "I am feeling very anxious about another peer on the unit. I came to my room to be alone to get away. I do good when I'm around people but I still see the shadow clouds when I'm alone. I have not heard any voices today. I feel like I am slowly getting better. I'm glad to be off the seroquel." Patient feels her depression is slowly decreasing and is rating it as a three today.   Objective: Patient observed sitting in her room and appears depressed. After talking with Clinical research associate the patient felt like going back to the dayroom and reported using coping skills to deal with her anxiety. She is ambulating with her cane and does not appear to be in distress.   Diagnosis:   DSM5: Schizophrenia Disorders:  Schizophrenia (295.7) Obsessive-Compulsive Disorders:  denies Trauma-Stressor Disorders:  denies Substance/Addictive Disorders:  denies Depressive Disorders:  Major Depressive Disorder - Severe (296.23)  Axis I: Schizoaffective Disorder-depressive disorder  ADL's:  Intact  Sleep: Fair  Appetite:  Fair  Suicidal Ideation: Passive SI no plan Plan:  denies Intent:  denies Means:  denies Homicidal Ideation: denies  AEB (as evidenced by): as evidence by patient's reported  Psychiatric Specialty Exam: Review of Systems  Constitutional: Negative.   HENT: Negative.   Eyes: Negative.   Respiratory: Negative.   Cardiovascular: Negative.   Gastrointestinal: Negative.   Genitourinary: Negative.   Musculoskeletal: Positive for back pain and joint pain.  Skin: Negative.   Neurological: Negative.   Endo/Heme/Allergies: Negative.   Psychiatric/Behavioral: Positive for depression and suicidal ideas. The patient is nervous/anxious and has insomnia.     Blood pressure 119/78, pulse 83, temperature 98.2 F (36.8 C), temperature  source Oral, resp. rate 16, height 5' 1.5" (1.562 m), weight 86.183 kg (190 lb).Body mass index is 35.32 kg/(m^2).  General Appearance: Fairly Groomed  Patent attorney::  Fair  Speech:  Clear and Coherent  Volume:  Normal  Mood:  Depressed and Hopeless  Affect:  Constricted  Thought Process:  Goal Directed  Orientation:  Full (Time, Place, and Person)  Thought Content:  Hallucinations: Auditory, Visual  Suicidal Thoughts:  Yes.  without intent/plan  Homicidal Thoughts:  No  Memory:  Immediate;   Fair Recent;   Fair Remote;   Fair  Judgement:  Impaired  Insight:  Shallow  Psychomotor Activity:  Decreased  Concentration:  Fair  Recall:  Fair  Akathisia:  No  Handed:  Right  AIMS (if indicated):     Assets:  Communication Skills Desire for Improvement Social Support  Sleep:  Number of Hours: 6.25   Current Medications: Current Facility-Administered Medications  Medication Dose Route Frequency Provider Last Rate Last Dose  . acetaminophen (TYLENOL) tablet 650 mg  650 mg Oral Q6H PRN Audrea Muscat, NP   650 mg at 04/23/13 0823  . alum & mag hydroxide-simeth (MAALOX/MYLANTA) 200-200-20 MG/5ML suspension 30 mL  30 mL Oral Q4H PRN Shuvon Rankin, NP   30 mL at 04/22/13 1955  . aspirin tablet 325 mg  325 mg Oral Daily Shuvon Rankin, NP   325 mg at 04/23/13 7829  . benztropine (COGENTIN) tablet 1 mg  1 mg Oral Daily Mojeed Akintayo   1 mg at 04/23/13 5621  . bisoprolol-hydrochlorothiazide (ZIAC) 5-6.25 MG per tablet 1 tablet  1 tablet Oral Daily  Shuvon Rankin, NP   1 tablet at 04/23/13 1610  . ibuprofen (ADVIL,MOTRIN) tablet 600 mg  600 mg Oral Q6H PRN Mojeed Akintayo   600 mg at 04/20/13 2156  . levothyroxine (SYNTHROID, LEVOTHROID) tablet 150 mcg  150 mcg Oral QAC breakfast Shuvon Rankin, NP   150 mcg at 04/23/13 0649  . magnesium hydroxide (MILK OF MAGNESIA) suspension 30 mL  30 mL Oral Daily PRN Shuvon Rankin, NP      . metFORMIN (GLUCOPHAGE) tablet 850 mg  850 mg Oral BID WC Shuvon  Rankin, NP   850 mg at 04/23/13 9604  . paliperidone (INVEGA) 24 hr tablet 6 mg  6 mg Oral Daily Shuvon Rankin, NP   6 mg at 04/23/13 5409  . pregabalin (LYRICA) capsule 75 mg  75 mg Oral BID Shuvon Rankin, NP   75 mg at 04/23/13 8119  . simvastatin (ZOCOR) tablet 20 mg  20 mg Oral QHS Shuvon Rankin, NP   20 mg at 04/22/13 2143  . traZODone (DESYREL) tablet 100 mg  100 mg Oral QHS PRN,MR X 1 Mojeed Akintayo   100 mg at 04/22/13 2145    Lab Results:  Results for orders placed during the hospital encounter of 04/20/13 (from the past 48 hour(s))  GLUCOSE, CAPILLARY     Status: None   Collection Time    04/21/13 12:09 PM      Result Value Range   Glucose-Capillary 98  70 - 99 mg/dL  GLUCOSE, CAPILLARY     Status: Abnormal   Collection Time    04/21/13  4:48 PM      Result Value Range   Glucose-Capillary 172 (*) 70 - 99 mg/dL   Comment 1 Notify RN    GLUCOSE, CAPILLARY     Status: Abnormal   Collection Time    04/21/13  8:40 PM      Result Value Range   Glucose-Capillary 132 (*) 70 - 99 mg/dL   Comment 1 Notify RN    GLUCOSE, CAPILLARY     Status: Abnormal   Collection Time    04/22/13  7:35 AM      Result Value Range   Glucose-Capillary 270 (*) 70 - 99 mg/dL  GLUCOSE, CAPILLARY     Status: Abnormal   Collection Time    04/22/13 11:43 AM      Result Value Range   Glucose-Capillary 109 (*) 70 - 99 mg/dL  GLUCOSE, CAPILLARY     Status: Abnormal   Collection Time    04/22/13  4:44 PM      Result Value Range   Glucose-Capillary 144 (*) 70 - 99 mg/dL  GLUCOSE, CAPILLARY     Status: Abnormal   Collection Time    04/22/13  7:58 PM      Result Value Range   Glucose-Capillary 138 (*) 70 - 99 mg/dL  GLUCOSE, CAPILLARY     Status: Abnormal   Collection Time    04/23/13  6:14 AM      Result Value Range   Glucose-Capillary 144 (*) 70 - 99 mg/dL    Physical Findings: AIMS: Facial and Oral Movements Muscles of Facial Expression: None, normal Lips and Perioral Area: None,  normal Jaw: None, normal Tongue: None, normal,Extremity Movements Upper (arms, wrists, hands, fingers): None, normal Lower (legs, knees, ankles, toes): None, normal, Trunk Movements Neck, shoulders, hips: None, normal, Overall Severity Severity of abnormal movements (highest score from questions above): None, normal Incapacitation due to abnormal movements: None, normal Patient's awareness of  abnormal movements (rate only patient's report): No Awareness, Dental Status Current problems with teeth and/or dentures?: No Does patient usually wear dentures?: No  CIWA:    COWS:     Treatment Plan Summary: Daily contact with patient to assess and evaluate symptoms and progress in treatment Medication management  Plan:1. Continue crisis management and stabilization. 2. Medication management to reduce current symptoms to base line and improve the patient's overall level of functioning 3. Treat health problems as indicated. Patient may use her cane for her chronic right leg pain.  4. Develop treatment plan to decrease risk of relapse upon discharge and the need for readmission. Anticipate d/c on Monday.  5. Psycho-social education regarding relapse prevention and self care. 6. Health care follow up as needed for medical problems. 7. Restart home medications where appropriate. 8. Continue Invega 6 mg for psychosis.   Medical Decision Making Problem Points:  Established problem, improving (1), Review of last therapy session (1) and Review of psycho-social stressors (1) Data Points:  Order Aims Assessment (2) Review of medication regiment & side effects (2) Review of new medications or change in dosage (2)  I certify that inpatient services furnished can reasonably be expected to improve the patient's condition.   Fransisca Kaufmann, NP-C 04/23/2013, 11:33 AM

## 2013-04-23 NOTE — Progress Notes (Signed)
Recreation Therapy Notes   Date: 09.26.2014 Time: 9:30am Location: [address] Dayroom  Group Topic: Coping Skills  Goal Area(s) Addresses:  Patient will successfully express themselves using art.   Behavioral Response: Appropriate, Engaged  Intervention: Art  Activity: Patient was given a worksheet with two facial profiles facing each other on it. Using the worksheet patient was asked to draw themselves at admission on the left side of the worksheet and the way they predict themselves to look at discharge on the right side of the worksheet.   Education: Self- Expression  Education Outcome: Acknowledges understanding  Clinical Observations/Feedback: Patient actively participated in activity. Patient facial features on both profiles, one with a down turned mouth and one with a slight smile. The face featuring the down turned mouth also included a large dark cloud with rain drops, patient drew tears coming from the eye of this face. Patient additionally shaded the ear dark, patient explained this was because she was closed to listening to others at her admission. The face featuring the smile included a bright eye and a sun, in place of the large dark cloud. The ear on this side of her paper was clear and uncovered.   Marykay Lex Buelah Rennie, LRT/CTRS  Jearl Klinefelter 04/23/2013 12:49 PM

## 2013-04-24 LAB — GLUCOSE, CAPILLARY
Glucose-Capillary: 130 mg/dL — ABNORMAL HIGH (ref 70–99)
Glucose-Capillary: 97 mg/dL (ref 70–99)

## 2013-04-24 MED ORDER — TRAZODONE HCL 50 MG PO TABS
150.0000 mg | ORAL_TABLET | Freq: Every day | ORAL | Status: DC
  Administered 2013-04-24 – 2013-04-25 (×2): 150 mg via ORAL
  Filled 2013-04-24 (×3): qty 1

## 2013-04-24 MED ORDER — DICLOFENAC SODIUM 1 % TD GEL
2.0000 g | Freq: Four times a day (QID) | TRANSDERMAL | Status: DC
  Administered 2013-04-24 – 2013-04-26 (×8): 2 g via TOPICAL
  Filled 2013-04-24: qty 100

## 2013-04-24 MED ORDER — PALIPERIDONE ER 6 MG PO TB24
9.0000 mg | ORAL_TABLET | Freq: Every day | ORAL | Status: DC
  Administered 2013-04-25 – 2013-04-26 (×2): 9 mg via ORAL
  Filled 2013-04-24 (×3): qty 1

## 2013-04-24 MED ORDER — MELOXICAM 7.5 MG PO TABS
7.5000 mg | ORAL_TABLET | Freq: Every day | ORAL | Status: DC
Start: 2013-04-24 — End: 2013-04-26
  Administered 2013-04-24 – 2013-04-26 (×3): 7.5 mg via ORAL
  Filled 2013-04-24 (×4): qty 1

## 2013-04-24 MED ORDER — LIDOCAINE 5 % EX PTCH
3.0000 | MEDICATED_PATCH | CUTANEOUS | Status: DC
  Administered 2013-04-24 – 2013-04-26 (×3): 3 via TRANSDERMAL
  Filled 2013-04-24 (×4): qty 3

## 2013-04-24 MED ORDER — DIPHENHYDRAMINE-ZINC ACETATE 2-0.1 % EX CREA
TOPICAL_CREAM | Freq: Two times a day (BID) | CUTANEOUS | Status: DC | PRN
  Filled 2013-04-24: qty 28

## 2013-04-24 MED ORDER — LIDOCAINE 5 % EX PTCH: 1.0000 | MEDICATED_PATCH | CUTANEOUS | Status: DC

## 2013-04-24 NOTE — Progress Notes (Signed)
Patient ID: Alexandra [name], female   DOB: Dec 03, 1960, 52 y.o.   MRN: [id number] [facility] MD Progress Note  04/24/2013 9:41 AM Alexandra [name]  MRN:  [id number] Subjective: [name] notes poor sleep last night, went to sleep but had trouble staying asleep. She also notes that she was more agitated yesterday due to another patient's problems that seemed to increase her anxiety. She reports and increase in auditory hallucinations due to this.     Today she notes that her appetite is fine and her depression is at a 3/10. She notes her sciatica is a problem as well as her headache. She is taking the Lyrica for her sciatica x 2 weeks now with no relief. Objective: Patient states no SI/HI but still endorses AVH which worsen with agitation. Affect is pleasant, speech is clear and goal directed, she does have a slight stutter when she is agitated. She plans to return home when she is discharged and will follow up at [facility]. Diagnosis:   DSM5: Schizophrenia Disorders:  Schizophrenia (295.7) Obsessive-Compulsive Disorders:  denies Trauma-Stressor Disorders:  denies Substance/Addictive Disorders:  denies Depressive Disorders:  Major Depressive Disorder - Severe (296.23)  Axis I: Schizoaffective Disorder-depressive disorder  ADL's:  Intact  Sleep: Fair  Appetite:  Fair  Suicidal Ideation: Passive SI no plan Plan:  denies Intent:  denies Means:  denies Homicidal Ideation: denies  AEB (as evidenced by): as evidence by patient's reported  Psychiatric Specialty Exam: Review of Systems  Constitutional: Negative.   HENT: Negative.   Eyes: Negative.   Respiratory: Negative.   Cardiovascular: Negative.   Gastrointestinal: Negative.   Genitourinary: Negative.   Musculoskeletal: Positive for back pain and joint pain.  Skin: Negative.   Neurological: Negative.   Endo/Heme/Allergies: Negative.   Psychiatric/Behavioral: Positive for depression and suicidal ideas. The patient is nervous/anxious and  has insomnia.     Blood pressure 108/75, pulse 73, temperature 98 F (36.7 C), temperature source Oral, resp. rate 18, height 5' 1.5" (1.562 m), weight 86.183 kg (190 lb).Body mass index is 35.32 kg/(m^2).  General Appearance: Fairly Groomed  Patent attorney::  Fair  Speech:  Clear and Coherent  Volume:  Normal  Mood:  Depressed and Hopeless  Affect:  Constricted  Thought Process:  Goal Directed  Orientation:  Full (Time, Place, and Person)  Thought Content:  Hallucinations: Auditory, Visual  Suicidal Thoughts:  Yes.  without intent/plan  Homicidal Thoughts:  No  Memory:  Immediate;   Fair Recent;   Fair Remote;   Fair  Judgement:  Impaired  Insight:  Shallow  Psychomotor Activity:  Decreased  Concentration:  Fair  Recall:  Fair  Akathisia:  No  Handed:  Right  AIMS (if indicated):     Assets:  Communication Skills Desire for Improvement Social Support  Sleep:  Number of Hours: 6.25   Current Medications: Current Facility-Administered Medications  Medication Dose Route Frequency Provider Last Rate Last Dose  . acetaminophen (TYLENOL) tablet 650 mg  650 mg Oral Q6H PRN Audrea Muscat, NP   650 mg at 04/24/13 0535  . alum & mag hydroxide-simeth (MAALOX/MYLANTA) 200-200-20 MG/5ML suspension 30 mL  30 mL Oral Q4H PRN Shuvon Rankin, NP   30 mL at 04/23/13 1907  . aspirin tablet 325 mg  325 mg Oral Daily Shuvon Rankin, NP   325 mg at 04/24/13 0841  . benztropine (COGENTIN) tablet 1 mg  1 mg Oral Daily Mojeed Akintayo   1 mg at 04/24/13 0841  . bisoprolol-hydrochlorothiazide [facility]) 5-6.25  MG per tablet 1 tablet  1 tablet Oral Daily Shuvon Rankin, NP   1 tablet at 04/24/13 0841  . ibuprofen (ADVIL,MOTRIN) tablet 600 mg  600 mg Oral Q6H PRN Mojeed Akintayo   600 mg at 04/20/13 2156  . levothyroxine (SYNTHROID, LEVOTHROID) tablet 150 mcg  150 mcg Oral QAC breakfast Shuvon Rankin, NP   150 mcg at 04/24/13 0535  . magnesium hydroxide (MILK OF MAGNESIA) suspension 30 mL  30 mL Oral Daily  PRN Shuvon Rankin, NP      . metFORMIN (GLUCOPHAGE) tablet 850 mg  850 mg Oral BID WC Shuvon Rankin, NP   850 mg at 04/24/13 0841  . paliperidone (INVEGA) 24 hr tablet 6 mg  6 mg Oral Daily Shuvon Rankin, NP   6 mg at 04/24/13 0841  . pregabalin (LYRICA) capsule 75 mg  75 mg Oral BID Shuvon Rankin, NP   75 mg at 04/24/13 0843  . simvastatin (ZOCOR) tablet 20 mg  20 mg Oral QHS Shuvon Rankin, NP   20 mg at 04/23/13 2122  . traZODone (DESYREL) tablet 100 mg  100 mg Oral QHS PRN,MR X 1 Mojeed Akintayo   100 mg at 04/24/13 0132    Lab Results:  Results for orders placed during the hospital encounter of 04/20/13 (from the past 48 hour(s))  GLUCOSE, CAPILLARY     Status: Abnormal   Collection Time    04/22/13 11:43 AM      Result Value Range   Glucose-Capillary 109 (*) 70 - 99 mg/dL  GLUCOSE, CAPILLARY     Status: Abnormal   Collection Time    04/22/13  4:44 PM      Result Value Range   Glucose-Capillary 144 (*) 70 - 99 mg/dL  GLUCOSE, CAPILLARY     Status: Abnormal   Collection Time    04/22/13  7:58 PM      Result Value Range   Glucose-Capillary 138 (*) 70 - 99 mg/dL  GLUCOSE, CAPILLARY     Status: Abnormal   Collection Time    04/23/13  6:14 AM      Result Value Range   Glucose-Capillary 144 (*) 70 - 99 mg/dL  GLUCOSE, CAPILLARY     Status: Abnormal   Collection Time    04/23/13 12:06 PM      Result Value Range   Glucose-Capillary 176 (*) 70 - 99 mg/dL  GLUCOSE, CAPILLARY     Status: None   Collection Time    04/23/13  5:11 PM      Result Value Range   Glucose-Capillary 99  70 - 99 mg/dL  GLUCOSE, CAPILLARY     Status: Abnormal   Collection Time    04/24/13  5:40 AM      Result Value Range   Glucose-Capillary 130 (*) 70 - 99 mg/dL   Comment 1 Notify RN     Comment 2 Documented in Chart      Physical Findings: AIMS: Facial and Oral Movements Muscles of Facial Expression: None, normal Lips and Perioral Area: None, normal Jaw: None, normal Tongue: None,  normal,Extremity Movements Upper (arms, wrists, hands, fingers): None, normal Lower (legs, knees, ankles, toes): None, normal, Trunk Movements Neck, shoulders, hips: None, normal, Overall Severity Severity of abnormal movements (highest score from questions above): None, normal Incapacitation due to abnormal movements: None, normal Patient's awareness of abnormal movements (rate only patient's report): No Awareness, Dental Status Current problems with teeth and/or dentures?: No Does patient usually wear dentures?: No  CIWA:  COWS:     Treatment Plan Summary: Daily contact with patient to assess and evaluate symptoms and progress in treatment Medication management  Plan: 1. Will increase invega to 9mg  po qd for psychosis. 2. Will add Mobic with meals for her sciatica pain. 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 She will take it with food. 3. Will also add Lidoderm patch to back for pain as well. 4. Will also add real ice pack to buttock on 10 minutes, off 20 for relief as well. 5. Will give Benadryl ointment to face for recent out break with itching. 6. Will continue other medications as noted. 7. ELOS: 3-4 days. Medical Decision Making Problem Points:  Established problem, improving (1), Review of last therapy session (1) and Review of psycho-social stressors (1) Data Points:  Order Aims Assessment (2) Review of medication regiment & side effects (2) Review of new medications or change in dosage (2)  I certify that inpatient services furnished can reasonably be expected to improve the patient's condition.  Rona Ravens. Mashburn RPAC 12:36 PM 04/24/2013  Discussed with provider. Reviewed note. Agree with above findings, assessment and plan.   Jacqulyn Cane, M.D.  04/24/2013 11:19 PM

## 2013-04-24 NOTE — Progress Notes (Signed)
Patient ID: Alexandra [name], female   DOB: [birth year], 52 y.o.   MRN: [id number] D)  Has been up and about this evening but went to bed early.  Has been pleasant and cooperative, but did c/o headache.  Was medicated, feels is keeping a good fluid balance.   A)  Will continue to monitor for safety, continue POC R)  Safety maintained.

## 2013-04-24 NOTE — [facility] Group Notes (Signed)
[facility] Group Notes:  (Clinical Social Work)  04/24/2013  11:15-11:45AM  Summary of Progress/Problems:   The main focus of today's process group was for the patient to identify ways in which they have in the past sabotaged their own recovery and reasons they may have done this/what they received from doing it.  We then worked to identify a specific plan to avoid doing this when discharged from the hospital for this admission.  The patient expressed that she has sometimes forgotten medications.  So now her daughter puts out her medications for her, except for two.  Those two she takes in the bathroom right after she brushes her teeth.  If she leaves the bathroom without taking them, she will forget entirely.  She then will develop a sore neck because one of the meds is Synthroid.  She was aware that this plan was put in place in the past because she has had problems with taking her meds regularly, but was unable to identify any means of ensuring that the other two meds are taken.  Type of Therapy:  Group Therapy - Process  Participation Level:  Minimal  Participation Quality:  Attentive  Affect:  Excited  Cognitive:  Confused  Insight:  Limited  Engagement in Therapy:  Improving  Modes of Intervention:  Clarification, Education, Exploration, Discussion  Ambrose Mantle, LCSW 04/24/2013, 1:12 PM

## 2013-04-24 NOTE — [facility] Group Notes (Signed)
Adult Psychoeducational Group Note  Date:  04/24/2013 Time:  8:28 PM  Group Topic/Focus:  Wrap-Up Group:   The focus of this group is to help patients review their daily goal of treatment and discuss progress on daily workbooks.   Participation Level:  Active    Participation Quality:  Appropriate  Affect:  Appropriate  Cognitive:  Appropriate  Insight: Appropriate  Engagement in Group:  Engaged  Modes of Intervention:  Discussion  Additional Comment: Pt expressed that she had a good day and she did not have any goal. Pt want a journal to writeThe focus of this group is to help patients review their daily goal of treatment and discuss progress on daily workbooks.  Octavio Manns 04/24/2013, 8:28 PM

## 2013-04-24 NOTE — Progress Notes (Signed)
Alexandra [name] is seen OOB [facility] on the [facility] today, tolerated fair. She is pleasant and cooperative, complaining only of the " noise and frequent commotion", created by the other patients. She denies active SI, she denies active hallucinations and states " I'm getting better.I  just want to go home..."    A SHe attends her groups, she takes her meds and she is engaged in her poc.    R Safety is in place and poc cont.

## 2013-04-25 LAB — GLUCOSE, CAPILLARY
Glucose-Capillary: 91 mg/dL (ref 70–99)
Glucose-Capillary: 108 mg/dL — ABNORMAL HIGH (ref 70–99)

## 2013-04-25 NOTE — [facility] Group Notes (Signed)
[facility] Group Notes:  (Nursing/MHT/Case Management/Adjunct)  Date:  04/25/2013  Time:  1000  Type of Therapy:  Nurse Education  Participation Level:  Active  Participation Quality:  Appropriate  Affect:  Appropriate  Cognitive:  Appropriate  Insight:  Good  Engagement in Group:  Developing/Improving  Modes of Intervention:  Education  Summary of Progress/Problems:  Cresenciano Lick 04/25/2013, 12:51 PM

## 2013-04-25 NOTE — Progress Notes (Addendum)
D:  Patient's self inventory sheet, patient has fair sleep, good appetite, low energy level, improving attention span.  Rated depression #1, hopelessness#3.  Denied withdrawals.  Denied SI.  Has experienced headache and rash in past 24 hours.  Pain goal today #3, worst pain #8.  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  No problems taking meds after discharge. A:  Medications administered per MD orders.  Emotional support and encouragement given patient. R:  Denied SI and HI.   Denied A/V hallucinations.  Contracts for safety.  Will continue to monitor for safety with 15 minute checks.  Safety maintained. Ambulates with cane.

## 2013-04-25 NOTE — [facility] Group Notes (Signed)
[facility] Group Notes:  (Clinical Social Work)  04/25/2013   11:15am-12:00pm  Summary of Progress/Problems:  The main focus of today's process group was to listen to a variety of genres of music and to identify that different types of music provoke different responses.  The patient then was able to identify personally what was soothing for them, as well as energizing.  Handouts were used to record feelings evoked, as well as how patient can personally use this knowledge in sleep habits, with depression, and with other symptoms.  The patient expressed understanding of concepts, as well as knowledge of how each type of music affected them and how this can be used when they are at home as a tool in their recovery.  Type of Therapy:  Music Therapy   Participation Level:  Active  Participation Quality:  Attentive and Sharing  Affect:  Appropriate, Smiling  Cognitive:  Oriented  Insight:  Engaged  Engagement in Therapy:  Engaged  Modes of Intervention:   Activity, Exploration  Ambrose Mantle, LCSW 04/25/2013, 12:07 PM

## 2013-04-25 NOTE — [facility] Group Notes (Signed)
[facility] Group Notes:  (Nursing/MHT/Case Management/Adjunct)  Date:  04/25/2013  Time:  8:56 PM  Type of Therapy:  Psychoeducational Skills  Participation Level:  Active  Participation Quality:  Appropriate  Affect:  Appropriate  Cognitive:  Appropriate  Insight:  Appropriate  Engagement in Group:  Engaged  Modes of Intervention:  Discussion  Summary of Progress/Problems:Pt expressed that she had no goals for today except to sleep well tonight.Pt said her day was good because she had got a visit from her daughters.  Octavio Manns 04/25/2013, 8:56 PM

## 2013-04-25 NOTE — Progress Notes (Signed)
Patient ID: Alexandra [name], female   DOB: October 10, 1960, 52 y.o.   MRN: [id number] D)  Has been out and about this evening, was sitting in the dayroom talking with select peers, attended group, and has been compliant with programming and meds.  Stated she has developed a rash on her chin and had asked for something to help the itching but order hadn't been put in, order was obtained from MD for benadryl creme, but Alexandra [name] had gone to bed and was asleep , will address it in am.  A)  Will continue to monitor for safety, continue POC R)  Safety maintained.

## 2013-04-25 NOTE — Progress Notes (Signed)
Patient ID: Alexandra [name], female   DOB: June 16, 1961, 52 y.o.   MRN: [id number]  [facility] MD Progress Note  04/25/2013 1:47 PM Alexandra [name]  MRN:  [id number] Subjective:  Patients states "I am feeling better. My depression is decreasing and I'm not thinking about hurting myself. I've been trying to stay engaged because then I have less symptoms. I still hear voices and see the clouds when I'm alone. Another peer is making me anxious and that is hard for me to deal with. I think I may volunteer some after I leave because staying busy really helps my symptoms. I'm really happy because my family visited me today."   Objective:  Patient observed watching TV in the dayroom. She appears less depressed as she is observed to have a much more expressive affect than on days past. Patient continues to complain about pain in her right hip but plans to follow up with her MD after d/c.   Diagnosis:   DSM5: Schizophrenia Disorders:  Schizophrenia (295.7) Obsessive-Compulsive Disorders:  denies Trauma-Stressor Disorders:  denies Substance/Addictive Disorders:  denies Depressive Disorders:  Major Depressive Disorder - Severe (296.23)  Axis I: Schizoaffective Disorder-depressive disorder  ADL's:  Intact  Sleep: Fair  Appetite:  Fair  Suicidal Ideation: Denies Plan:  denies Intent:  denies Means:  denies Homicidal Ideation: denies  AEB (as evidenced by): as evidence by patient's reported  Psychiatric Specialty Exam: Review of Systems  Constitutional: Negative.   HENT: Negative.   Eyes: Negative.   Respiratory: Negative.   Cardiovascular: Negative.   Gastrointestinal: Negative.   Genitourinary: Negative.   Musculoskeletal: Positive for back pain and joint pain.  Skin: Negative.   Neurological: Negative.   Endo/Heme/Allergies: Negative.   Psychiatric/Behavioral: Positive for depression and suicidal ideas. The patient is nervous/anxious and has insomnia.     Blood pressure 120/81, pulse  75, temperature 97.6 F (36.4 C), temperature source Oral, resp. rate 18, height 5' 1.5" (1.562 m), weight 86.183 kg (190 lb).Body mass index is 35.32 kg/(m^2).  General Appearance: Fairly Groomed  Patent attorney::  Fair  Speech:  Clear and Coherent  Volume:  Normal  Mood:  Depressed and Hopeless  Affect:  Constricted  Thought Process:  Goal Directed  Orientation:  Full (Time, Place, and Person)  Thought Content:  Hallucinations: Auditory, Visual  Suicidal Thoughts:  Denies  Homicidal Thoughts:  No  Memory:  Immediate;   Fair Recent;   Fair Remote;   Fair  Judgement:  Impaired  Insight:  Shallow  Psychomotor Activity:  Decreased  Concentration:  Fair  Recall:  Fair  Akathisia:  No  Handed:  Right  AIMS (if indicated):     Assets:  Communication Skills Desire for Improvement Social Support  Sleep:  Number of Hours: 6   Current Medications: Current Facility-Administered Medications  Medication Dose Route Frequency Provider Last Rate Last Dose  . acetaminophen (TYLENOL) tablet 650 mg  650 mg Oral Q6H PRN Audrea Muscat, NP   650 mg at 04/24/13 0535  . alum & mag hydroxide-simeth (MAALOX/MYLANTA) 200-200-20 MG/5ML suspension 30 mL  30 mL Oral Q4H PRN Shuvon Rankin, NP   30 mL at 04/23/13 1907  . aspirin tablet 325 mg  325 mg Oral Daily Shuvon Rankin, NP   325 mg at 04/24/13 0841  . benztropine (COGENTIN) tablet 1 mg  1 mg Oral Daily Mojeed Akintayo   1 mg at 04/24/13 0841  . bisoprolol-hydrochlorothiazide (ZIAC) 5-6.25 MG per tablet 1 tablet  1 tablet Oral  Daily Shuvon Rankin, NP   1 tablet at 04/24/13 0841  . ibuprofen (ADVIL,MOTRIN) tablet 600 mg  600 mg Oral Q6H PRN Mojeed Akintayo   600 mg at 04/20/13 2156  . levothyroxine (SYNTHROID, LEVOTHROID) tablet 150 mcg  150 mcg Oral QAC breakfast Shuvon Rankin, NP   150 mcg at 04/24/13 0535  . magnesium hydroxide (MILK OF MAGNESIA) suspension 30 mL  30 mL Oral Daily PRN Shuvon Rankin, NP      . metFORMIN (GLUCOPHAGE) tablet 850 mg   850 mg Oral BID WC Shuvon Rankin, NP   850 mg at 04/24/13 0841  . paliperidone (INVEGA) 24 hr tablet 6 mg  6 mg Oral Daily Shuvon Rankin, NP   6 mg at 04/24/13 0841  . pregabalin (LYRICA) capsule 75 mg  75 mg Oral BID Shuvon Rankin, NP   75 mg at 04/24/13 0843  . simvastatin (ZOCOR) tablet 20 mg  20 mg Oral QHS Shuvon Rankin, NP   20 mg at 04/23/13 2122  . traZODone (DESYREL) tablet 100 mg  100 mg Oral QHS PRN,MR X 1 Mojeed Akintayo   100 mg at 04/24/13 0132    Lab Results:  Results for orders placed during the hospital encounter of 04/20/13 (from the past 48 hour(s))  GLUCOSE, CAPILLARY     Status: Abnormal   Collection Time    04/22/13 11:43 AM      Result Value Range   Glucose-Capillary 109 (*) 70 - 99 mg/dL  GLUCOSE, CAPILLARY     Status: Abnormal   Collection Time    04/22/13  4:44 PM      Result Value Range   Glucose-Capillary 144 (*) 70 - 99 mg/dL  GLUCOSE, CAPILLARY     Status: Abnormal   Collection Time    04/22/13  7:58 PM      Result Value Range   Glucose-Capillary 138 (*) 70 - 99 mg/dL  GLUCOSE, CAPILLARY     Status: Abnormal   Collection Time    04/23/13  6:14 AM      Result Value Range   Glucose-Capillary 144 (*) 70 - 99 mg/dL  GLUCOSE, CAPILLARY     Status: Abnormal   Collection Time    04/23/13 12:06 PM      Result Value Range   Glucose-Capillary 176 (*) 70 - 99 mg/dL  GLUCOSE, CAPILLARY     Status: None   Collection Time    04/23/13  5:11 PM      Result Value Range   Glucose-Capillary 99  70 - 99 mg/dL  GLUCOSE, CAPILLARY     Status: Abnormal   Collection Time    04/24/13  5:40 AM      Result Value Range   Glucose-Capillary 130 (*) 70 - 99 mg/dL   Comment 1 Notify RN     Comment 2 Documented in Chart      Physical Findings: AIMS: Facial and Oral Movements Muscles of Facial Expression: None, normal Lips and Perioral Area: None, normal Jaw: None, normal Tongue: None, normal,Extremity Movements Upper (arms, wrists, hands, fingers): None,  normal Lower (legs, knees, ankles, toes): None, normal, Trunk Movements Neck, shoulders, hips: None, normal, Overall Severity Severity of abnormal movements (highest score from questions above): None, normal Incapacitation due to abnormal movements: None, normal Patient's awareness of abnormal movements (rate only patient's report): No Awareness, Dental Status Current problems with teeth and/or dentures?: No Does patient usually wear dentures?: No  CIWA:  CIWA-Ar Total: 1 COWS:  COWS Total Score: 0  Treatment Plan Summary: Daily contact with patient to assess and evaluate symptoms and progress in treatment Medication management  Plan: 1. Continue Invega to 9mg  po qd for psychosis. 2. Will add Mobic with meals for her sciatica pain. 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 She will take it with food. 3. Continue Lidoderm patch to back for pain as well. 4. Will also add real ice pack to buttock on 10 minutes, off 20 for relief as well. 5. Will give Benadryl ointment to face for recent out break with itching. 6. Will continue other medications as noted. 7. Continue current medication regimen. Possible d/c tomorrow.   Medical Decision Making Problem Points:  Established problem, improving (1), Review of last therapy session (1) and Review of psycho-social stressors (1) Data Points:  Order Aims Assessment (2) Review of medication regiment & side effects (2) Review of new medications or change in dosage (2)  I certify that inpatient services furnished can reasonably be expected to improve the patient's condition.  Fransisca Kaufmann NP-C 1:47 PM 04/25/2013  Discussed with provider. Reviewed note. Agree with above findings, assessment and plan.   Jacqulyn Cane, M.D.  04/25/2013 11:44 PM

## 2013-04-25 NOTE — Progress Notes (Signed)
Patient resting quietly with eyes closed. Respirations even and unlabored. No distress noted . Q 15 minute check continues as ordered to maintain safety. 

## 2013-04-26 LAB — GLUCOSE, CAPILLARY: Glucose-Capillary: 119 mg/dL — ABNORMAL HIGH (ref 70–99)

## 2013-04-26 MED ORDER — BENZTROPINE MESYLATE 1 MG PO TABS: 1.0000 mg | ORAL_TABLET | Freq: Every day | ORAL | Status: DC

## 2013-04-26 MED ORDER — SIMVASTATIN 20 MG PO TABS: 20.0000 mg | ORAL_TABLET | Freq: Every day | ORAL | Status: DC

## 2013-04-26 MED ORDER — NITROGLYCERIN 0.4 MG SL SUBL: 0.4000 mg | SUBLINGUAL_TABLET | SUBLINGUAL | Status: DC | PRN

## 2013-04-26 MED ORDER — ASPIRIN 325 MG PO TABS: 325.0000 mg | ORAL_TABLET | Freq: Every day | ORAL | Status: DC

## 2013-04-26 MED ORDER — TRAZODONE HCL 150 MG PO TABS: 150.0000 mg | ORAL_TABLET | Freq: Every day | ORAL | Status: DC

## 2013-04-26 MED ORDER — LIDOCAINE 5 % EX PTCH: 1.0000 | MEDICATED_PATCH | CUTANEOUS | Status: DC

## 2013-04-26 MED ORDER — DICLOFENAC SODIUM 1 % TD GEL: 1.0000 "application " | Freq: Four times a day (QID) | TRANSDERMAL | Status: DC

## 2013-04-26 MED ORDER — MECLIZINE HCL 25 MG PO TABS: 25.0000 mg | ORAL_TABLET | Freq: Four times a day (QID) | ORAL | Status: DC

## 2013-04-26 MED ORDER — PREGABALIN 75 MG PO CAPS: 75.0000 mg | ORAL_CAPSULE | Freq: Two times a day (BID) | ORAL | Status: DC

## 2013-04-26 MED ORDER — METFORMIN HCL 850 MG PO TABS: 850.0000 mg | ORAL_TABLET | Freq: Two times a day (BID) | ORAL | Status: DC

## 2013-04-26 MED ORDER — BISOPROLOL-HYDROCHLOROTHIAZIDE 5-6.25 MG PO TABS: 1.0000 | ORAL_TABLET | Freq: Every day | ORAL | Status: DC

## 2013-04-26 MED ORDER — PALIPERIDONE ER 9 MG PO TB24: 9.0000 mg | ORAL_TABLET | Freq: Every day | ORAL | Status: DC

## 2013-04-26 MED ORDER — PALIPERIDONE ER 3 MG PO TB24
9.0000 mg | ORAL_TABLET | Freq: Every day | ORAL | Status: DC
  Filled 2013-04-26: qty 9

## 2013-04-26 MED ORDER — LEVOTHYROXINE SODIUM 150 MCG PO TABS: ORAL_TABLET | ORAL | Status: DC

## 2013-04-26 MED ORDER — FLUTICASONE PROPIONATE 50 MCG/ACT NA SUSP: 2.0000 | Freq: Every day | NASAL | Status: DC

## 2013-04-26 NOTE — Discharge Summary (Signed)
Physician Discharge Summary Note  Patient:  Alexandra [name] is an 52 y.o., female MRN:  [id number] DOB:  09/11/1960 Patient phone:  [phone] (home)  Patient address:   [address] [place] [address],   Date of Admission:  04/20/2013 Date of Discharge: 04/26/13  Reason for Admission:  Increased depression, intentional overdose  Discharge Diagnoses: Principal Problem:   Schizoaffective disorder-depressed type  Review of Systems  Constitutional: Negative.   HENT: Negative.   Eyes: Negative.   Respiratory: Negative.   Cardiovascular: Negative.   Gastrointestinal: Negative.   Genitourinary: Negative.   Musculoskeletal: Negative.   Skin: Negative.   Neurological: Negative.   Endo/Heme/Allergies: Negative.   Psychiatric/Behavioral: Positive for depression (Stabilized with medication) and memory loss. Negative for suicidal ideas, hallucinations and substance abuse. The patient is nervous/anxious (Stabilized with medication prior to discharge) and has insomnia (Stabilized with medication prior to discharge).     DSM5: Schizophrenia Disorders:  Schizoaffective disorder-depressed type Obsessive-Compulsive Disorders:   Trauma-Stressor Disorders:   Substance/Addictive Disorders:   Depressive Disorders:  Schizoaffective disorder-depressed type  Axs Diagnosis:  AXIS I:  Schizoaffective disorder-depressed type AXIS II:  Deferred AXIS III:   Past Medical History  Diagnosis Date  . GERD (gastroesophageal reflux disease)     prn med.  . Hypothyroidism   . Hypertension     new dx. - will start med. 06/28/2011  . Sleep apnea     prior to gastric bypass - no sleep study since bypass  . Rotator cuff tear, right   . Diabetes mellitus   . Hyperlipidemia   . Hiatal hernia 1998  . Hyperplastic colon polyp   . Fatty liver   . Hyperthyroidism    AXIS IV:  economic problems, occupational problems, other psychosocial or environmental problems and Chronic mental illness AXIS  V:  61  Level of Care:  OP  Hospital Course: Alexandra [name] is a 52 year old female who was admitted voluntarily after her son called EMS to report that his mother had overdosed on her medications. Patient states today "I overdosed on my seroquel and invega. I have been very depressed because I have no job and the financial problems are getting worse. I heard voices telling me to take the pills. Normally I can tune them out by turning up the TV but this time that did not work. I'm not hearing voices today but I still see shadows out of the corner of my eye. I'm sorry but I am so sleepy right now because of some medication I took. I am going to group but it's hard to stay awake right now." The patient is very cooperative with Clinical research associate and appears to be a good historian as information she provides matches up with what is recorded previously in her chart.   While a patient in this hospital and after admission assessment/evaluation, it was determined based on patient's symptoms that Alexandra. [name] will need medication management to stabilize her current depressive mood with psychotic features. She was then ordered and received Invega 9 mg 24 hr tablet Q daily for mood control, cogentin 1 mg daily for EPS prevention and Trazodone 150 mg Q bedtime for sleep. She also was enrolled in group counseling sessions and activities where she was counseled and learned coping skills that should help her cope better and manage her symptoms effectively after discharge. She also received medication management and monitoring for her other previously existing medical issues and concerns that she presented. Alexandra. [name] tolerated her treatment regimen without  any significant adverse effects and or reactions reported.   Patient did respond positively to her treatment regimen.This is evidenced by her daily reports of improved mood, reduction of symptoms and presentation of good affect/eye contact. Alexandra [name] attended treatment team meeting this am  and met with her treatment team members. Her reason for admission, present symptoms, response to treatment and discharge plans discussed with patient. Patient endorsed that her symptoms has stabilized and that she is ready for discharge to pursue psychiatric care on outpatient basis. It was then agreed upon that patient will follow-up care at the [facility] psychiatric clinic here in [place]. She has been instructed that this is a walk-in appointment between the hours of 088-09:00 am, Monday thru Friday. She also will be attending counseling sessions at the [facility] on Tuesday nights. The addresses, dates, times and contact information for these clinics provided for patient in writing.  Upon discharge, Alexandra. [name] adamantly denies any suicidal, homicidal ideations, auditory, visual hallucinations, paranoia and or delusional thoughts. She was provided with 3 days worth supply samples of her [facility] discharge medications. She left [facility] with all personal belongings via personal arranged transport in no apparent distress.  Consults:  psychiatry  Significant Diagnostic Studies:  labs: CBC with diff, CMP, UDS, Toxiclogy tests, U/A  Discharge Vitals:   Blood pressure 118/78, pulse 72, temperature 97 F (36.1 C), temperature source Oral, resp. rate 18, height 5' 1.5" (1.562 m), weight 86.183 kg (190 lb). Body mass index is 35.32 kg/(m^2). Lab Results:   Results for orders placed during the hospital encounter of 04/20/13 (from the past 72 hour(s))  GLUCOSE, CAPILLARY     Status: None   Collection Time    04/24/13 12:18 PM      Result Value Range   Glucose-Capillary 88  70 - 99 mg/dL  GLUCOSE, CAPILLARY     Status: None   Collection Time    04/24/13  5:13 PM      Result Value Range   Glucose-Capillary 97  70 - 99 mg/dL  GLUCOSE, CAPILLARY     Status: Abnormal   Collection Time    04/25/13  5:40 AM      Result Value Range   Glucose-Capillary 130 (*) 70 - 99 mg/dL   Comment 1 Notify RN      Comment 2 Documented in Chart    GLUCOSE, CAPILLARY     Status: None   Collection Time    04/25/13 12:13 PM      Result Value Range   Glucose-Capillary 91  70 - 99 mg/dL   Comment 1 Documented in Chart     Comment 2 Notify RN    GLUCOSE, CAPILLARY     Status: Abnormal   Collection Time    04/25/13  5:11 PM      Result Value Range   Glucose-Capillary 108 (*) 70 - 99 mg/dL  GLUCOSE, CAPILLARY     Status: Abnormal   Collection Time    04/25/13  9:52 PM      Result Value Range   Glucose-Capillary 141 (*) 70 - 99 mg/dL  GLUCOSE, CAPILLARY     Status: Abnormal   Collection Time    04/26/13  6:18 AM      Result Value Range   Glucose-Capillary 119 (*) 70 - 99 mg/dL    Physical Findings: AIMS: Facial and Oral Movements Muscles of Facial Expression: None, normal Lips and Perioral Area: None, normal Jaw: None, normal Tongue: None, normal,Extremity Movements Upper (arms,  wrists, hands, fingers): None, normal Lower (legs, knees, ankles, toes): None, normal, Trunk Movements Neck, shoulders, hips: None, normal, Overall Severity Severity of abnormal movements (highest score from questions above): None, normal Incapacitation due to abnormal movements: None, normal Patient's awareness of abnormal movements (rate only patient's report): No Awareness, Dental Status Current problems with teeth and/or dentures?: No Does patient usually wear dentures?: No  CIWA:  CIWA-Ar Total: 1 COWS:  COWS Total Score: 0  Psychiatric Specialty Exam: See Psychiatric Specialty Exam and Suicide Risk Assessment completed by Attending Physician prior to discharge.  Discharge destination:  Home  Is patient on multiple antipsychotic therapies at discharge:  No   Has Patient had three or more failed trials of antipsychotic monotherapy by history:  No  Recommended Plan for Multiple Antipsychotic Therapies: NA       Future Appointments Provider Department Dept Phone   06/28/2013 9:45 AM Baker Pierini,  FNP [facility] [phone]       Medication List    STOP taking these medications       clonazePAM 0.5 MG tablet  Commonly known as:  KLONOPIN     HYDROcodone-acetaminophen 5-325 MG per tablet  Commonly known as:  NORCO/VICODIN     QUEtiapine Fumarate 150 MG 24 hr tablet  Commonly known as:  SEROQUEL XR     traMADol 50 MG tablet  Commonly known as:  ULTRAM      TAKE these medications     Indication   aspirin 325 MG tablet  Take 1 tablet (325 mg total) by mouth daily. For heart health   Indication:  Heart health     benztropine 1 MG tablet  Commonly known as:  COGENTIN  Take 1 tablet (1 mg total) by mouth daily. For prevention of drug related involuntary movements   Indication:  Extrapyramidal Reaction caused by Medications     bisoprolol-hydrochlorothiazide 5-6.25 MG per tablet  Commonly known as:  ZIAC  Take 1 tablet by mouth daily. For high blood pressure control   Indication:  High Blood Pressure     diclofenac sodium 1 % Gel  Commonly known as:  VOLTAREN  Apply 1 application topically 4 (four) times daily. For arthritic pain management   Indication:  Joint Damage causing Pain and Loss of Function     fluticasone 50 MCG/ACT nasal spray  Commonly known as:  FLONASE  Place 2 sprays into the nose daily. For allergies   Indication:  Perennial Rhinitis, Hayfever     levothyroxine 150 MCG tablet  Commonly known as:  SYNTHROID, LEVOTHROID  TAKE 1 TABLET EVERY DAY: For thyroid hormone replacement   Indication:  Underactive Thyroid     lidocaine 5 %  Commonly known as:  LIDODERM  Place 1 patch onto the skin daily. Remove & Discard patch within 12 hours or as directed by MD: For pain   Indication:  Pain management     meclizine 25 MG tablet  Commonly known as:  ANTIVERT  Take 1 tablet (25 mg total) by mouth 4 (four) times daily. For motion motion sickness   Indication:  Motion Sickness     metFORMIN 850 MG tablet  Commonly known as:   GLUCOPHAGE  Take 1 tablet (850 mg total) by mouth 2 (two) times daily with a meal. For diabetes management   Indication:  Type 2 Diabetes     nitroGLYCERIN 0.4 MG SL tablet  Commonly known as:  NITROSTAT  Place 1 tablet (0.4 mg total) under the  tongue every 5 (five) minutes as needed for chest pain (CP or SOB).   Indication:  Acute Angina Pectoris     paliperidone 9 MG 24 hr tablet  Commonly known as:  INVEGA  Take 1 tablet (9 mg total) by mouth daily. For mood control   Indication:  Schizoaffective Disorder     pregabalin 75 MG capsule  Commonly known as:  LYRICA  Take 1 capsule (75 mg total) by mouth 2 (two) times daily. For pain/spasms/stiffness   Indication:  Fibromyalgia Syndrome     simvastatin 20 MG tablet  Commonly known as:  ZOCOR  Take 1 tablet (20 mg total) by mouth at bedtime. For high cholesterol control   Indication:  Inherited Heterozygous Hypercholesterolemia, Inherited Homozygous Hypercholesterolemia, Nonfamilial Heterozygous Hypercholesterolemia     traZODone 150 MG tablet  Commonly known as:  DESYREL  Take 1 tablet (150 mg total) by mouth at bedtime. For sleep   Indication:  Trouble Sleeping       Follow-up Information   Follow up with [facility] . (Go to walk-in clinic M-F between 8 and 9am for your hospital follow up appointment.  )    Contact information:   [address] [place] [phone]      Follow up with [facility] . (Go see Arnette Felts for your counseling session on Tuesday nights.  )    Contact information:   [address]  [place] [336] [phone]     Follow-up recommendations:  Activity:  As tolerated Diet: As recommended by your primary care doctor. Keep all scheduled follow-up appointments as recommended.  Comments: Take all your medications as prescribed by your mental healthcare provider. Report any adverse effects and or reactions from your medicines to your outpatient provider promptly. Patient is  instructed and cautioned to not engage in alcohol and or illegal drug use while on prescription medicines. In the event of worsening symptoms, patient is instructed to call the crisis hotline, 911 and or go to the nearest ED for appropriate evaluation and treatment of symptoms. Follow-up with your primary care provider for your other medical issues, concerns and or health care needs.   Total Discharge Time:  Greater than 30 minutes.  Signed: Armandina Stammer I, PMHNP-BC 04/27/2013, 5:55 AM

## 2013-04-26 NOTE — Progress Notes (Signed)
[facility] Adult Case Management Discharge Plan :  Will you be returning to the same living situation after discharge: Yes,  home At discharge, do you have transportation home?:Yes,  family Do you have the ability to pay for your medications:Yes,  mental health  Release of information consent forms completed and in the chart;  Patient's signature needed at discharge.  Patient to Follow up at: Follow-up Information   Follow up with [facility] . (Go to walk-in clinic M-F between 8 and 9am for your hospital follow up appointment.  )    Contact information:   [address] [place] [phone]      Follow up with [facility] . (Go see Arnette Felts for your counseling session on Tuesday nights.  )    Contact information:   [address]  [place] [336] [phone]      Patient denies SI/HI:   Yes,  yes    Safety Planning and Suicide Prevention discussed:  Yes,  yes  Ida Rogue 04/26/2013, 12:04 PM

## 2013-04-26 NOTE — Progress Notes (Signed)
Seen and agreed. Milany Geck, MD 

## 2013-04-26 NOTE — Tx Team (Signed)
  Interdisciplinary Treatment Plan Update   Date Reviewed:  04/26/2013  Time Reviewed:  11:46 AM  Progress in Treatment:   Attending groups: Yes Participating in groups: Yes Taking medication as prescribed: Yes  Tolerating medication: Yes Family/Significant other contact made: Yes  Patient understands diagnosis: Yes  Discussing patient identified problems/goals with staff: Yes Medical problems stabilized or resolved: Yes Denies suicidal/homicidal ideation: Yes  In tx team Patient has not harmed self or others: Yes  For review of initial/current patient goals, please see plan of care.  Estimated Length of Stay:  D/C today  Reason for Continuation of Hospitalization:   New Problems/Goals identified:  N/A  Discharge Plan or Barriers:   return home, follow up outpt  Additional Comments:  Attendees:  Signature: Thedore Mins, MD 04/26/2013 11:46 AM   Signature: Richelle Ito, LCSW 04/26/2013 11:46 AM  Signature: Fransisca Kaufmann, NP 04/26/2013 11:46 AM  Signature: Joslyn Devon, RN 04/26/2013 11:46 AM  Signature: Liborio Nixon, RN 04/26/2013 11:46 AM  Signature:  04/26/2013 11:46 AM  Signature:   04/26/2013 11:46 AM  Signature:    Signature:    Signature:    Signature:    Signature:    Signature:      Scribe for Treatment Team:   Richelle Ito, LCSW  04/26/2013 11:46 AM

## 2013-04-26 NOTE — [facility] Suicide Risk Assessment (Signed)
Suicide Risk Assessment  Discharge Assessment     Demographic Factors:  Low socioeconomic status, Unemployed and Female  Mental Status Per Nursing Assessment::   On Admission:     Current Mental Status by Physician: patient denies suicidal ideation,intent or plan  Loss Factors: Decrease in vocational status and Financial problems/change in socioeconomic status  Historical Factors: NA  Risk Reduction Factors:   Sense of responsibility to family, Religious beliefs about death, Living with another person, especially a relative and Positive social support  Continued Clinical Symptoms:  Resolving depression and psychosis  Cognitive Features That Contribute To Risk:  Closed-mindedness Polarized thinking    Suicide Risk:  Minimal: No identifiable suicidal ideation.  Patients presenting with no risk factors but with morbid ruminations; may be classified as minimal risk based on the severity of the depressive symptoms  Discharge Diagnoses:   AXIS I:  Schizoaffective disorder depressed type  AXIS II:  Deferred AXIS III:   Past Medical History  Diagnosis Date  . GERD (gastroesophageal reflux disease)     prn med.  . Hypothyroidism   . Hypertension     new dx. - will start med. 06/28/2011  . Sleep apnea     prior to gastric bypass - no sleep study since bypass  . Rotator cuff tear, right   . Diabetes mellitus   . Hyperlipidemia   . Hiatal hernia 1998  . Hyperplastic colon polyp   . Fatty liver   . Hyperthyroidism    AXIS IV:  economic problems, other psychosocial or environmental problems and problems related to social environment AXIS V:  61-70 mild symptoms  Plan Of Care/Follow-up recommendations:  Activity:  as tolerated Diet:  healthy Tests:  routine Other:  patient to keep her after care appointment  Is patient on multiple antipsychotic therapies at discharge:  No   Has Patient had three or more failed trials of antipsychotic monotherapy by history:   No  Recommended Plan for Multiple Antipsychotic Therapies: N/A  Thedore Mins, MD 04/26/2013, 9:57 AM

## 2013-04-26 NOTE — Progress Notes (Signed)
Patient ID: Alexandra [name], female   DOB: 1960/08/06, 52 y.o.   MRN: [id number] Patient discharged home per MD order.  Patient received all personal belongings, prescriptions and medication samples. Follow up appointments were reviewed. She denies any SI/HI/AVH.  Medications reviewed with patient and she indicated understanding.  Patient left ambulatory with her daughter.  Survey was completed and filed.

## 2013-04-27 MED ORDER — LEVOTHYROXINE SODIUM 150 MCG PO TABS: ORAL_TABLET | ORAL | Status: DC

## 2013-04-28 ENCOUNTER — Other Ambulatory Visit (HOSPITAL_COMMUNITY)
Admission: RE | Admit: 2013-04-28 | Discharge: 2013-04-28 | Disposition: A | Discharge status: Home / Self Care | Payer: BC Managed Care – PPO | Source: Ambulatory Visit | Attending: Family | Admitting: Family

## 2013-04-28 ENCOUNTER — Ambulatory Visit (INDEPENDENT_AMBULATORY_CARE_PROVIDER_SITE_OTHER): Payer: BC Managed Care – PPO | Admitting: Family

## 2013-04-28 ENCOUNTER — Encounter: Payer: Self-pay | Admitting: Family

## 2013-04-28 VITALS — BP 118/80 | HR 71 | Wt 192.0 lb

## 2013-04-28 DIAGNOSIS — R300 Dysuria: Secondary | ICD-10-CM

## 2013-04-28 DIAGNOSIS — N760 Acute vaginitis: Secondary | ICD-10-CM | POA: Insufficient documentation

## 2013-04-28 DIAGNOSIS — N898 Other specified noninflammatory disorders of vagina: Secondary | ICD-10-CM

## 2013-04-28 LAB — POCT URINALYSIS DIPSTICK
Bilirubin, UA: NEGATIVE
Glucose, UA: NEGATIVE
Spec Grav, UA: 1.015
Urobilinogen, UA: 0.2
pH, UA: 7

## 2013-04-28 MED ORDER — CLOTRIMAZOLE-BETAMETHASONE 1-0.05 % EX CREA: TOPICAL_CREAM | Freq: Two times a day (BID) | CUTANEOUS | Status: DC

## 2013-04-28 NOTE — Progress Notes (Signed)
Subjective:    Patient ID: Alexandra [name], female    DOB: [birth year], 52 y.o.   MRN: [id number]  HPI 52 year old F. American female, nonsmoker, patient of Dr. Lovell Sheehan is in today with complaints of dysuria, yellow discharge without odor x1 week. She is not sexually active. Denies any frequency urgency.  Also has complaints of a rash on her face that she describes as dry and itchy x1 week. Denies any changes in detergents soaps, or lotions.  Review of Systems  Constitutional: Negative.   HENT: Negative.   Respiratory: Negative.   Cardiovascular: Negative.   Gastrointestinal: Negative.   Genitourinary: Positive for dysuria, vaginal discharge and difficulty urinating.  Musculoskeletal: Negative.   Skin: Positive for rash.       face  Hematological: Negative.   Psychiatric/Behavioral: Negative.    Past Medical History  Diagnosis Date  . GERD (gastroesophageal reflux disease)     prn med.  . Hypothyroidism   . Hypertension     new dx. - will start med. 06/28/2011  . Sleep apnea     prior to gastric bypass - no sleep study since bypass  . Rotator cuff tear, right   . Diabetes mellitus   . Hyperlipidemia   . Hiatal hernia 1998  . Hyperplastic colon polyp   . Fatty liver   . Hyperthyroidism     History   Social History  . Marital Status: Married    Spouse Name: N/A    Number of Children: N/A  . Years of Education: N/A   Occupational History  . Not on file.   Social History Main Topics  . Smoking status: Never Smoker   . Smokeless tobacco: Never Used  . Alcohol Use: No  . Drug Use: No  . Sexual Activity: Yes   Other Topics Concern  . Not on file   Social History Narrative  . No narrative on file    Past Surgical History  Procedure Laterality Date  . Cesarean section  1991  . Cholecystectomy  1989  . Shoulder arthroscopy distal clavicle excision and open rotator cuff repair  07/26/2010    right  . Shoulder arthroscopy  01/02/2006; 05/31/2004    left 2007;  right 2005  . Exploratory laparotomy  05/08/2000    right salpingo-oophorectomy  . Abdominal hysterectomy  06/25/2000    left salpingo-oophorectomy  . 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 Laminotomy / excision disk posterior cervical spine  08/10/2009    C7-T1  . Thyroid surgery  early 2000s    goiter  . Cardiac catheterization  04/01/2006  . Shoulder arthroscopy  07/02/2011    Procedure: ARTHROSCOPY SHOULDER;  Surgeon: Wyn Forster., MD;  Location: [facility];  Service: Orthopedics;  Laterality: Right;  repair supraspinatus, Debride glenohumeral joint, labrum  . Back surgery  1997    discetomy, lumbar spinal fusion  . Tubal ligation      Family History  Problem Relation Age of Onset  . Coronary artery disease      Allergies  Allergen Reactions  . Shrimp Flavor Swelling    Swelling of tongue and rash  . Ace Inhibitors Cough  . Oxycodone-Acetaminophen Rash    Current Outpatient Prescriptions on File Prior to Visit  Medication Sig Dispense Refill  . aspirin 325 MG tablet Take 1 tablet (325 mg total) by mouth daily. For heart health  30 tablet  3  . benztropine (COGENTIN) 1 MG tablet Take 1 tablet (1 mg total) by mouth  daily. For prevention of drug related involuntary movements  30 tablet  0  . bisoprolol-hydrochlorothiazide (ZIAC) 5-6.25 MG per tablet Take 1 tablet by mouth daily. For high blood pressure control      . diclofenac sodium (VOLTAREN) 1 % GEL Apply 1 application topically 4 (four) times daily. For arthritic pain management  100 g  0  . fluticasone (FLONASE) 50 MCG/ACT nasal spray Place 2 sprays into the nose daily. For allergies  16 g  6  . levothyroxine (SYNTHROID, LEVOTHROID) 150 MCG tablet TAKE 1 TABLET EVERY DAY: For thyroid hormone replacement  90 tablet  3  . lidocaine (LIDODERM) 5 % Place 1 patch onto the skin daily. Remove & Discard patch within 12 hours or as directed by MD: For pain  90 patch  3  . meclizine (ANTIVERT) 25 MG tablet Take  1 tablet (25 mg total) by mouth 4 (four) times daily. For motion motion sickness  28 tablet  0  . metFORMIN (GLUCOPHAGE) 850 MG tablet Take 1 tablet (850 mg total) by mouth 2 (two) times daily with a meal. For diabetes management  180 tablet  3  . nitroGLYCERIN (NITROSTAT) 0.4 MG SL tablet Place 1 tablet (0.4 mg total) under the tongue every 5 (five) minutes as needed for chest pain (CP or SOB).  60 tablet  12  . paliperidone (INVEGA) 9 MG 24 hr tablet Take 1 tablet (9 mg total) by mouth daily. For mood control  30 tablet  0  . pregabalin (LYRICA) 75 MG capsule Take 1 capsule (75 mg total) by mouth 2 (two) times daily. For pain/spasms/stiffness      . simvastatin (ZOCOR) 20 MG tablet Take 1 tablet (20 mg total) by mouth at bedtime. For high cholesterol control  90 tablet  3  . traZODone (DESYREL) 150 MG tablet Take 1 tablet (150 mg total) by mouth at bedtime. For sleep  30 tablet  0  . [DISCONTINUED] DULoxetine (CYMBALTA) 20 MG capsule Take 20 mg by mouth daily. Samples per Dr Lovell Sheehan      . [DISCONTINUED] gabapentin (NEURONTIN) 300 MG capsule Take 300 mg by mouth 2 (two) times daily. Per Dr Teressa Senter       No current facility-administered medications on file prior to visit.    BP 118/80  Pulse 71  Wt 192 lb (87.091 kg)  BMI 35.7 kg/m2chart    Objective:   Physical Exam  Constitutional: She is oriented to person, place, and time. She appears well-developed and well-nourished.  HENT:  Right Ear: External ear normal.  Left Ear: External ear normal.  Nose: Nose normal.  Mouth/Throat: Oropharynx is clear and moist.  Neck: Normal range of motion. Neck supple.  Cardiovascular: Normal rate, regular rhythm and normal heart sounds.   Pulmonary/Chest: Effort normal and breath sounds normal.  Abdominal: Soft. Bowel sounds are normal.  Genitourinary: Vagina normal.  Small amount of thick white vaginal discharge affixed to the vaginal wall  Musculoskeletal: Normal range of motion.  Neurological: She  is alert and oriented to person, place, and time.  Skin: Skin is warm and dry. Rash noted.  Dry, flaky rash to the perioral area  Psychiatric: She has a normal mood and affect.          Assessment & Plan:  Assessment: 1.Dysuria 2. Vaginal discharge  3. Dermatitis  Plan: Continue current medications. Urine culture sent. Wet prep sent. Will notify patient of results. Lotrisone cream a thin layer applied to the affected area twice a day  x1 week only. Call the office with any questions or concerns. Recheck pending labs and sooner as needed.

## 2013-04-28 NOTE — Patient Instructions (Addendum)

## 2013-04-28 NOTE — Discharge Summary (Signed)
Seen and agreed. Brydon Spahr, MD 

## 2013-04-29 NOTE — Progress Notes (Signed)
Patient Discharge Instructions:  After Visit Summary (AVS):   Faxed to:  04/29/13 Discharge Summary Note:   Faxed to:  04/29/13 Psychiatric Admission Assessment Note:   Faxed to:  04/29/13 Suicide Risk Assessment - Discharge Assessment:   Faxed to:  04/29/13 Faxed/Sent to the Next [facility] Care provider:  04/29/13 Faxed to [facility] @ [phone] Faxed to [facility] @ [phone]  Jerelene Redden, 04/29/2013, 3:11 PM

## 2013-04-30 ENCOUNTER — Encounter: Payer: Self-pay | Admitting: Family

## 2013-05-11 ENCOUNTER — Encounter: Payer: Self-pay | Admitting: Family

## 2013-05-11 ENCOUNTER — Other Ambulatory Visit: Payer: Self-pay

## 2013-05-11 MED ORDER — GLUCOSE BLOOD VI STRP: ORAL_STRIP | Status: DC

## 2013-05-14 ENCOUNTER — Other Ambulatory Visit: Payer: Self-pay

## 2013-05-14 DIAGNOSIS — E1059 Type 1 diabetes mellitus with other circulatory complications: Secondary | ICD-10-CM

## 2013-05-14 DIAGNOSIS — E119 Type 2 diabetes mellitus without complications: Secondary | ICD-10-CM

## 2013-05-14 MED ORDER — GLUCOSE BLOOD VI STRP: ORAL_STRIP | Status: DC

## 2013-05-19 ENCOUNTER — Encounter: Payer: Self-pay | Admitting: Family

## 2013-05-19 ENCOUNTER — Other Ambulatory Visit: Payer: Self-pay

## 2013-05-19 NOTE — Telephone Encounter (Signed)
Strips were sent to [facility] on 05/14/13

## 2013-05-26 ENCOUNTER — Ambulatory Visit: Payer: Self-pay | Admitting: Family

## 2013-06-07 ENCOUNTER — Encounter: Payer: Self-pay | Admitting: Family

## 2013-06-08 ENCOUNTER — Other Ambulatory Visit: Payer: Self-pay

## 2013-06-08 MED ORDER — TRAMADOL HCL 50 MG PO TABS: 50.0000 mg | ORAL_TABLET | Freq: Three times a day (TID) | ORAL | Status: DC

## 2013-06-09 ENCOUNTER — Telehealth: Payer: Self-pay | Admitting: Internal Medicine

## 2013-06-09 NOTE — Telephone Encounter (Signed)
[facility] Triage Call Report Triage Record Num: [id number] Operator: April Finney Patient Name: Alexandra [name] Call Date & Time: 06/08/2013 8:59:38PM Patient Phone: [phone] PCP: Adline Mango Patient Gender: Female PCP Fax : Patient DOB: 1961-06-27 Practice Name: [facility] Reason for Call: Caller: Jordan/Other; PCP: Darryll Capers (Adults only); CB#: [phone]; Call regarding Confusion, slurred speech and severe headache. Onset this evening. Call 911 care advice given per confusion, disorientation, agitation protocol for severe headache and new or worsening confusion, disorientation or agitation. Protocol(s) Used: Confusion, Disorientation, Agitation Recommended Outcome per Protocol: Activate EMS 911 Reason for Outcome: Severe headache AND new or worsening confusion, disorientation or agitation Care Advice: ~ Do not give the patient anything to eat or drink. ~ IMMEDIATE ACTION Write down provider's name. List or place the following in a bag for transport with the patient: current prescription and/or nonprescription medications; alternative treatments, therapies and medications; and street drugs. ~ An adult should stay with the patient, preferably one trained in CPR. If the person is not trained in CPR, then he or she should provide hands-only (compression-only) CPR as recommended by the American Heart Association. ~ 11/

## 2013-06-15 ENCOUNTER — Encounter: Payer: Self-pay | Admitting: *Deleted

## 2013-06-16 ENCOUNTER — Telehealth: Payer: Self-pay | Admitting: Family

## 2013-06-16 ENCOUNTER — Ambulatory Visit (INDEPENDENT_AMBULATORY_CARE_PROVIDER_SITE_OTHER): Payer: BC Managed Care – PPO | Admitting: Family

## 2013-06-16 ENCOUNTER — Encounter: Payer: Self-pay | Admitting: Family

## 2013-06-16 ENCOUNTER — Other Ambulatory Visit: Payer: Self-pay | Admitting: Family

## 2013-06-16 VITALS — BP 122/86 | HR 83 | Wt 196.0 lb

## 2013-06-16 DIAGNOSIS — N390 Urinary tract infection, site not specified: Secondary | ICD-10-CM

## 2013-06-16 DIAGNOSIS — IMO0001 Reserved for inherently not codable concepts without codable children: Secondary | ICD-10-CM

## 2013-06-16 DIAGNOSIS — R300 Dysuria: Secondary | ICD-10-CM

## 2013-06-16 DIAGNOSIS — E039 Hypothyroidism, unspecified: Secondary | ICD-10-CM

## 2013-06-16 DIAGNOSIS — E785 Hyperlipidemia, unspecified: Secondary | ICD-10-CM

## 2013-06-16 LAB — HEPATIC FUNCTION PANEL
ALT: 19 U/L (ref 0–35)
AST: 23 U/L (ref 0–37)
Albumin: 3.3 g/dL — ABNORMAL LOW (ref 3.5–5.2)
Alkaline Phosphatase: 76 U/L (ref 39–117)
Bilirubin, Direct: 0 mg/dL (ref 0.0–0.3)
Total Bilirubin: 0.5 mg/dL (ref 0.3–1.2)

## 2013-06-16 LAB — BASIC METABOLIC PANEL
BUN: 7 mg/dL (ref 6–23)
CO2: 31 mEq/L (ref 19–32)
Chloride: 99 mEq/L (ref 96–112)
Sodium: 138 mEq/L (ref 135–145)

## 2013-06-16 LAB — LIPID PANEL
LDL Cholesterol: 64 mg/dL (ref 0–99)
Total CHOL/HDL Ratio: 5

## 2013-06-16 LAB — POCT URINALYSIS DIPSTICK
Glucose, UA: NEGATIVE
Ketones, UA: NEGATIVE
Spec Grav, UA: 1.015

## 2013-06-16 LAB — HEMOGLOBIN A1C: Hgb A1c MFr Bld: 8 % — ABNORMAL HIGH (ref 4.6–6.5)

## 2013-06-16 MED ORDER — METFORMIN HCL 1000 MG PO TABS: 1000.0000 mg | ORAL_TABLET | Freq: Two times a day (BID) | ORAL | Status: DC

## 2013-06-16 MED ORDER — CIPROFLOXACIN HCL 500 MG PO TABS: 500.0000 mg | ORAL_TABLET | Freq: Two times a day (BID) | ORAL | Status: DC

## 2013-06-16 MED ORDER — POTASSIUM CHLORIDE CRYS ER 20 MEQ PO TBCR: 20.0000 meq | EXTENDED_RELEASE_TABLET | Freq: Two times a day (BID) | ORAL | Status: DC

## 2013-06-16 MED ORDER — LEVOTHYROXINE SODIUM 137 MCG PO TABS: 137.0000 ug | ORAL_TABLET | Freq: Every day | ORAL | Status: DC

## 2013-06-16 NOTE — Patient Instructions (Signed)
Urinary Tract Infection  Urinary tract infections (UTIs) can develop anywhere along your urinary tract. Your urinary tract is your body's drainage system for removing wastes and extra water. Your urinary tract includes two kidneys, two ureters, a bladder, and a urethra. Your kidneys are a pair of bean-shaped organs. Each kidney is about the size of your fist. They are located below your ribs, one on each side of your spine.  CAUSES  Infections are caused by microbes, which are microscopic organisms, including fungi, viruses, and bacteria. These organisms are so small that they can only be seen through a microscope. Bacteria are the microbes that most commonly cause UTIs.  SYMPTOMS   Symptoms of UTIs may vary by age and gender of the patient and by the location of the infection. Symptoms in young women typically include a frequent and intense urge to urinate and a painful, burning feeling in the bladder or urethra during urination. Older women and men are more likely to be tired, shaky, and weak and have muscle aches and abdominal pain. A fever may mean the infection is in your kidneys. Other symptoms of a kidney infection include pain in your back or sides below the ribs, nausea, and vomiting.  DIAGNOSIS  To diagnose a UTI, your caregiver will ask you about your symptoms. Your caregiver also will ask to provide a urine sample. The urine sample will be tested for bacteria and white blood cells. White blood cells are made by your body to help fight infection.  TREATMENT   Typically, UTIs can be treated with medication. Because most UTIs are caused by a bacterial infection, they usually can be treated with the use of antibiotics. The choice of antibiotic and length of treatment depend on your symptoms and the type of bacteria causing your infection.  HOME CARE INSTRUCTIONS   If you were prescribed antibiotics, take them exactly as your caregiver instructs you. Finish the medication even if you feel better after you  have only taken some of the medication.   Drink enough water and fluids to keep your urine clear or pale yellow.   Avoid caffeine, tea, and carbonated beverages. They tend to irritate your bladder.   Empty your bladder often. Avoid holding urine for long periods of time.   Empty your bladder before and after sexual intercourse.   After a bowel movement, women should cleanse from front to back. Use each tissue only once.  SEEK MEDICAL CARE IF:    You have back pain.   You develop a fever.   Your symptoms do not begin to resolve within 3 days.  SEEK IMMEDIATE MEDICAL CARE IF:    You have severe back pain or lower abdominal pain.   You develop chills.   You have nausea or vomiting.   You have continued burning or discomfort with urination.  MAKE SURE YOU:    Understand these instructions.   Will watch your condition.   Will get help right away if you are not doing well or get worse.  Document Released: 04/24/2005 Document Revised: 01/14/2012 Document Reviewed: 08/23/2011  ExitCare Patient Information 2014 [employer].

## 2013-06-16 NOTE — Telephone Encounter (Signed)
See results note. 

## 2013-06-16 NOTE — Progress Notes (Signed)
Pre visit review using our clinic review tool, if applicable. No additional management support is needed unless otherwise documented below in the visit note. 

## 2013-06-16 NOTE — Telephone Encounter (Signed)
Potassium is too low. Needs to start potassium twice a day x 1 week. Recheck potassium in 1 week.

## 2013-06-16 NOTE — Progress Notes (Signed)
Subjective:    Patient ID: Alexandra [name], female    DOB: [birth year], 52 y.o.   MRN: [id number]  HPI 52 year old [place] American female, patient of Dr. Lovell Sheehan in today with complaints of urinary frequency, burning with urination, urgency x1 week. Urine has been malodorous. She has a history of type 2 diabetes, bipolar disorder, hypothyroidism. Reports her blood sugars have been running a little higher over the last few days. Fasting blood sugar this morning was 200.   Review of Systems  Constitutional: Negative.   HENT: Negative.   Cardiovascular: Negative.   Gastrointestinal: Negative.   Endocrine: Negative for polyphagia and polyuria.       Elevated blood sugar  Genitourinary: Positive for dysuria, urgency and frequency.  Musculoskeletal: Negative.   Neurological: Negative.   Psychiatric/Behavioral: Negative.    Past Medical History  Diagnosis Date  . GERD (gastroesophageal reflux disease)     prn med.  . Hypothyroidism   . Hypertension     new dx. - will start med. 06/28/2011  . Sleep apnea     prior to gastric bypass - no sleep study since bypass  . Rotator cuff tear, right   . Diabetes mellitus   . Hyperlipidemia   . Hiatal hernia 1998  . Hyperplastic colon polyp   . Fatty liver   . Hyperthyroidism     History   Social History  . Marital Status: Married    Spouse Name: N/A    Number of Children: N/A  . Years of Education: N/A   Occupational History  . Not on file.   Social History Main Topics  . Smoking status: Never Smoker   . Smokeless tobacco: Never Used  . Alcohol Use: No  . Drug Use: No  . Sexual Activity: Yes   Other Topics Concern  . Not on file   Social History Narrative  . No narrative on file    Past Surgical History  Procedure Laterality Date  . Cesarean section  1991  . Cholecystectomy  1989  . Shoulder arthroscopy distal clavicle excision and open rotator cuff repair  07/26/2010    right  . Shoulder arthroscopy  01/02/2006;  05/31/2004    left 2007; right 2005  . Exploratory laparotomy  05/08/2000    right salpingo-oophorectomy  . Abdominal hysterectomy  06/25/2000    left salpingo-oophorectomy  . 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 Laminotomy / excision disk posterior cervical spine  08/10/2009    C7-T1  . Thyroid surgery  early 2000s    goiter  . Cardiac catheterization  04/01/2006  . Shoulder arthroscopy  07/02/2011    Procedure: ARTHROSCOPY SHOULDER;  Surgeon: Wyn Forster., MD;  Location: [facility];  Service: Orthopedics;  Laterality: Right;  repair supraspinatus, Debride glenohumeral joint, labrum  . Back surgery  1997    discetomy, lumbar spinal fusion  . Tubal ligation      Family History  Problem Relation Age of Onset  . Coronary artery disease      Allergies  Allergen Reactions  . Shrimp Flavor Swelling    Swelling of tongue and rash  . Ace Inhibitors Cough  . Oxycodone-Acetaminophen Rash    Current Outpatient Prescriptions on File Prior to Visit  Medication Sig Dispense Refill  . aspirin 325 MG tablet Take 1 tablet (325 mg total) by mouth daily. For heart health  30 tablet  3  . benztropine (COGENTIN) 1 MG tablet Take 1 tablet (1 mg total) by mouth daily.  For prevention of drug related involuntary movements  30 tablet  0  . bisoprolol-hydrochlorothiazide (ZIAC) 5-6.25 MG per tablet Take 1 tablet by mouth daily. For high blood pressure control      . clotrimazole-betamethasone (LOTRISONE) cream Apply topically 2 (two) times daily.  30 g  0  . diclofenac sodium (VOLTAREN) 1 % GEL Apply 1 application topically 4 (four) times daily. For arthritic pain management  100 g  0  . fluticasone (FLONASE) 50 MCG/ACT nasal spray Place 2 sprays into the nose daily. For allergies  16 g  6  . glucose blood (ONE TOUCH ULTRA TEST) test strip Use as instructed  100 each  12  . levothyroxine (SYNTHROID, LEVOTHROID) 150 MCG tablet TAKE 1 TABLET EVERY DAY: For thyroid hormone replacement   90 tablet  3  . lidocaine (LIDODERM) 5 % Place 1 patch onto the skin daily. Remove & Discard patch within 12 hours or as directed by MD: For pain  90 patch  3  . meclizine (ANTIVERT) 25 MG tablet Take 1 tablet (25 mg total) by mouth 4 (four) times daily. For motion motion sickness  28 tablet  0  . metFORMIN (GLUCOPHAGE) 850 MG tablet Take 1 tablet (850 mg total) by mouth 2 (two) times daily with a meal. For diabetes management  180 tablet  3  . nitroGLYCERIN (NITROSTAT) 0.4 MG SL tablet Place 1 tablet (0.4 mg total) under the tongue every 5 (five) minutes as needed for chest pain (CP or SOB).  60 tablet  12  . paliperidone (INVEGA) 9 MG 24 hr tablet Take 1 tablet (9 mg total) by mouth daily. For mood control  30 tablet  0  . pregabalin (LYRICA) 75 MG capsule Take 1 capsule (75 mg total) by mouth 2 (two) times daily. For pain/spasms/stiffness      . simvastatin (ZOCOR) 20 MG tablet Take 1 tablet (20 mg total) by mouth at bedtime. For high cholesterol control  90 tablet  3  . traMADol (ULTRAM) 50 MG tablet Take 1 tablet (50 mg total) by mouth every 8 (eight) hours.  60 tablet  0  . traZODone (DESYREL) 150 MG tablet Take 1 tablet (150 mg total) by mouth at bedtime. For sleep  30 tablet  0  . [DISCONTINUED] DULoxetine (CYMBALTA) 20 MG capsule Take 20 mg by mouth daily. Samples per Dr Lovell Sheehan      . [DISCONTINUED] gabapentin (NEURONTIN) 300 MG capsule Take 300 mg by mouth 2 (two) times daily. Per Dr Teressa Senter       No current facility-administered medications on file prior to visit.    BP 122/86  Pulse 83  Wt 196 lb (88.905 kg)chart    Objective:   Physical Exam  Constitutional: She is oriented to person, place, and time. She appears well-developed and well-nourished.  Neck: Normal range of motion. Neck supple.  Cardiovascular: Normal rate, regular rhythm and normal heart sounds.   Pulmonary/Chest: Effort normal and breath sounds normal.  Abdominal: Soft. Bowel sounds are normal.   Musculoskeletal: Normal range of motion.  Neurological: She is alert and oriented to person, place, and time.  Skin: Skin is warm and dry.  Psychiatric: She has a normal mood and affect.          Assessment & Plan:  Assessment: 1. Urinary tract infection 2. Dysuria 3. Type 2 diabetes-uncontrolled 4. Hypothyroidism  Plan: Cipro, and milligrams twice a day x7 days. Drink clear fluids. Avoid caffeine. Labs sent today since she is fasting. Return  for 3 month recheck next week and sooner as needed.

## 2013-06-28 ENCOUNTER — Other Ambulatory Visit: Payer: Self-pay | Admitting: Family

## 2013-06-28 ENCOUNTER — Encounter: Payer: Self-pay | Admitting: Family

## 2013-06-28 ENCOUNTER — Ambulatory Visit (INDEPENDENT_AMBULATORY_CARE_PROVIDER_SITE_OTHER): Payer: BC Managed Care – PPO | Admitting: Family

## 2013-06-28 VITALS — BP 120/82 | HR 76 | Wt 192.0 lb

## 2013-06-28 DIAGNOSIS — F319 Bipolar disorder, unspecified: Secondary | ICD-10-CM

## 2013-06-28 DIAGNOSIS — IMO0002 Reserved for concepts with insufficient information to code with codable children: Secondary | ICD-10-CM

## 2013-06-28 DIAGNOSIS — J309 Allergic rhinitis, unspecified: Secondary | ICD-10-CM

## 2013-06-28 DIAGNOSIS — E039 Hypothyroidism, unspecified: Secondary | ICD-10-CM

## 2013-06-28 DIAGNOSIS — E876 Hypokalemia: Secondary | ICD-10-CM

## 2013-06-28 DIAGNOSIS — E7800 Pure hypercholesterolemia, unspecified: Secondary | ICD-10-CM

## 2013-06-28 DIAGNOSIS — IMO0001 Reserved for inherently not codable concepts without codable children: Secondary | ICD-10-CM

## 2013-06-28 DIAGNOSIS — E1165 Type 2 diabetes mellitus with hyperglycemia: Secondary | ICD-10-CM

## 2013-06-28 LAB — LIPID PANEL
Cholesterol: 132 mg/dL (ref 0–200)
HDL: 34.6 mg/dL — ABNORMAL LOW (ref >39.00)
LDL Cholesterol: 79 mg/dL (ref 0–99)
Total CHOL/HDL Ratio: 4
VLDL: 18.2 mg/dL (ref 0.0–40.0)

## 2013-06-28 LAB — BASIC METABOLIC PANEL
BUN: 7 mg/dL (ref 6–23)
Chloride: 103 mEq/L (ref 96–112)
GFR: 118.28 mL/min (ref >60.00)
Potassium: 3.6 mEq/L (ref 3.5–5.1)
Sodium: 141 mEq/L (ref 135–145)

## 2013-06-28 LAB — HEPATIC FUNCTION PANEL
AST: 18 U/L (ref 0–37)
Albumin: 3.6 g/dL (ref 3.5–5.2)
Alkaline Phosphatase: 64 U/L (ref 39–117)
Bilirubin, Direct: 0.1 mg/dL (ref 0.0–0.3)
Total Bilirubin: 0.5 mg/dL (ref 0.3–1.2)

## 2013-06-28 LAB — HEMOGLOBIN A1C: Hgb A1c MFr Bld: 8.2 % — ABNORMAL HIGH (ref 4.6–6.5)

## 2013-06-28 MED ORDER — GLIPIZIDE 5 MG PO TABS: 5.0000 mg | ORAL_TABLET | Freq: Two times a day (BID) | ORAL | Status: DC

## 2013-06-28 NOTE — Progress Notes (Signed)
Subjective:    Patient ID: Alexandra [name], female    DOB: 05/30/1961, 52 y.o.   MRN: [id number]  HPI 52 year old [place] American female, nonsmoker with a history of type 2 diabetes, hypothyroidism, hyperlipidemia, bipolar disorder is in for recheck. She is a patient of Dr. Lovell Sheehan. Reports her fasting blood sugars in the low 200s. Blood glucoses are typically between 200-300. Does admit to drinking Pepsi and sweet tea does not routinely exercise.    Review of Systems  Constitutional: Negative.   HENT: Negative.   Respiratory: Negative.   Cardiovascular: Negative.   Gastrointestinal: Negative.   Endocrine: Negative.   Genitourinary: Negative.   Musculoskeletal: Negative.   Skin: Negative.   Neurological: Negative.   Hematological: Negative.   Psychiatric/Behavioral: Negative.    Past Medical History  Diagnosis Date  . GERD (gastroesophageal reflux disease)     prn med.  . Hypothyroidism   . Hypertension     new dx. - will start med. 06/28/2011  . Sleep apnea     prior to gastric bypass - no sleep study since bypass  . Rotator cuff tear, right   . Diabetes mellitus   . Hyperlipidemia   . Hiatal hernia 1998  . Hyperplastic colon polyp   . Fatty liver   . Hyperthyroidism     History   Social History  . Marital Status: Married    Spouse Name: N/A    Number of Children: N/A  . Years of Education: N/A   Occupational History  . Not on file.   Social History Main Topics  . Smoking status: Never Smoker   . Smokeless tobacco: Never Used  . Alcohol Use: No  . Drug Use: No  . Sexual Activity: Yes   Other Topics Concern  . Not on file   Social History Narrative  . No narrative on file    Past Surgical History  Procedure Laterality Date  . Cesarean section  1991  . Cholecystectomy  1989  . Shoulder arthroscopy distal clavicle excision and open rotator cuff repair  07/26/2010    right  . Shoulder arthroscopy  01/02/2006; 05/31/2004    left 2007; right 2005    . Exploratory laparotomy  05/08/2000    right salpingo-oophorectomy  . Abdominal hysterectomy  06/25/2000    left salpingo-oophorectomy  . 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 Laminotomy / excision disk posterior cervical spine  08/10/2009    C7-T1  . Thyroid surgery  early 2000s    goiter  . Cardiac catheterization  04/01/2006  . Shoulder arthroscopy  07/02/2011    Procedure: ARTHROSCOPY SHOULDER;  Surgeon: Wyn Forster., MD;  Location: [facility];  Service: Orthopedics;  Laterality: Right;  repair supraspinatus, Debride glenohumeral joint, labrum  . Back surgery  1997    discetomy, lumbar spinal fusion  . Tubal ligation      Family History  Problem Relation Age of Onset  . Coronary artery disease      Allergies  Allergen Reactions  . Shrimp Flavor Swelling    Swelling of tongue and rash  . Ace Inhibitors Cough  . Oxycodone-Acetaminophen Rash    Current Outpatient Prescriptions on File Prior to Visit  Medication Sig Dispense Refill  . aspirin 325 MG tablet Take 1 tablet (325 mg total) by mouth daily. For heart health  30 tablet  3  . benztropine (COGENTIN) 1 MG tablet Take 1 tablet (1 mg total) by mouth daily. For prevention of drug related  involuntary movements  30 tablet  0  . bisoprolol-hydrochlorothiazide (ZIAC) 5-6.25 MG per tablet Take 1 tablet by mouth daily. For high blood pressure control      . ciprofloxacin (CIPRO) 500 MG tablet Take 1 tablet (500 mg total) by mouth 2 (two) times daily.  14 tablet  0  . clotrimazole-betamethasone (LOTRISONE) cream Apply topically 2 (two) times daily.  30 g  0  . diclofenac sodium (VOLTAREN) 1 % GEL Apply 1 application topically 4 (four) times daily. For arthritic pain management  100 g  0  . fluticasone (FLONASE) 50 MCG/ACT nasal spray Place 2 sprays into the nose daily. For allergies  16 g  6  . glucose blood (ONE TOUCH ULTRA TEST) test strip Use as instructed  100 each  12  . levothyroxine (SYNTHROID) 137  MCG tablet Take 1 tablet (137 mcg total) by mouth daily before breakfast.  30 tablet  1  . lidocaine (LIDODERM) 5 % Place 1 patch onto the skin daily. Remove & Discard patch within 12 hours or as directed by MD: For pain  90 patch  3  . meclizine (ANTIVERT) 25 MG tablet Take 1 tablet (25 mg total) by mouth 4 (four) times daily. For motion motion sickness  28 tablet  0  . metFORMIN (GLUCOPHAGE) 1000 MG tablet Take 1 tablet (1,000 mg total) by mouth 2 (two) times daily with a meal.  60 tablet  3  . nitroGLYCERIN (NITROSTAT) 0.4 MG SL tablet Place 1 tablet (0.4 mg total) under the tongue every 5 (five) minutes as needed for chest pain (CP or SOB).  60 tablet  12  . paliperidone (INVEGA) 9 MG 24 hr tablet Take 1 tablet (9 mg total) by mouth daily. For mood control  30 tablet  0  . potassium chloride SA (K-DUR,KLOR-CON) 20 MEQ tablet Take 1 tablet (20 mEq total) by mouth 2 (two) times daily.  60 tablet  3  . pregabalin (LYRICA) 75 MG capsule Take 1 capsule (75 mg total) by mouth 2 (two) times daily. For pain/spasms/stiffness      . simvastatin (ZOCOR) 20 MG tablet Take 1 tablet (20 mg total) by mouth at bedtime. For high cholesterol control  90 tablet  3  . traMADol (ULTRAM) 50 MG tablet Take 1 tablet (50 mg total) by mouth every 8 (eight) hours.  60 tablet  0  . traZODone (DESYREL) 150 MG tablet Take 1 tablet (150 mg total) by mouth at bedtime. For sleep  30 tablet  0  . [DISCONTINUED] DULoxetine (CYMBALTA) 20 MG capsule Take 20 mg by mouth daily. Samples per Dr Lovell Sheehan      . [DISCONTINUED] gabapentin (NEURONTIN) 300 MG capsule Take 300 mg by mouth 2 (two) times daily. Per Dr Teressa Senter       No current facility-administered medications on file prior to visit.    BP 120/82  Pulse 76  Wt 192 lb (87.091 kg)chart    Objective:   Physical Exam  Constitutional: She is oriented to person, place, and time. She appears well-developed and well-nourished.  HENT:  Right Ear: External ear normal.  Left Ear:  External ear normal.  Nose: Nose normal.  Mouth/Throat: Oropharynx is clear and moist.  Neck: Neck supple.  Cardiovascular: Normal rate, regular rhythm and normal heart sounds.   Pulmonary/Chest: Effort normal and breath sounds normal.  Abdominal: Soft. Bowel sounds are normal.  Musculoskeletal: Normal range of motion.  Neurological: She is alert and oriented to person, place, and time.  Skin: Skin is warm and dry.  Psychiatric: She has a normal mood and affect.          Assessment & Plan: 1. In assessment: 1.  Assessment:  1. Type 2 Diabetes-uncontrolled 2. Hypercholesterolemia 3. Hypothyroidism  4. Bipolar Disorder  Plan: Labs sent to include an A1c, BMP, LFTs, lipids, TSH notify patient of results. Encouraged healthy diet, exercise. Decrease intake of sweet tea and Pepsi. Followup pending results and possibly refer to endocrinology if her A1c is not well controlled.

## 2013-06-28 NOTE — Patient Instructions (Signed)

## 2013-06-29 ENCOUNTER — Telehealth: Payer: Self-pay | Admitting: Internal Medicine

## 2013-06-29 NOTE — Telephone Encounter (Signed)
1.  Pt rx glipiZIDE (GLUCOTROL) 5 MG tablet on 12/1 and wants to know does she continue metformin w/ that also? 2. Pt signed the rx agreement to not get any other narc from another md, but pt states she went to pain mangement md to and he rx hydrocodone 10-325 mg. Pt signed agreement w/ him and advised pt to call and cancel the one w. [place].

## 2013-06-29 NOTE — Telephone Encounter (Signed)
Pt aware that she should take both meds and pain management agreement is null and void here because we will not be prescribing any narcotics

## 2013-07-05 ENCOUNTER — Encounter: Payer: Self-pay | Admitting: Internal Medicine

## 2013-08-28 ENCOUNTER — Telehealth: Payer: Self-pay | Admitting: Internal Medicine

## 2013-08-28 NOTE — Telephone Encounter (Signed)
[facility] requesting refill of levothyroxine (SYNTHROID) 137 MCG tablet

## 2013-08-30 ENCOUNTER — Other Ambulatory Visit: Payer: Self-pay | Admitting: *Deleted

## 2013-08-30 MED ORDER — LEVOTHYROXINE SODIUM 137 MCG PO TABS: 137.0000 ug | ORAL_TABLET | Freq: Every day | ORAL | Status: DC

## 2013-08-30 NOTE — Telephone Encounter (Signed)
done

## 2013-09-08 ENCOUNTER — Telehealth: Payer: Self-pay | Admitting: Internal Medicine

## 2013-09-08 MED ORDER — BISOPROLOL-HYDROCHLOROTHIAZIDE 5-6.25 MG PO TABS: 1.0000 | ORAL_TABLET | Freq: Every day | ORAL | Status: DC

## 2013-09-08 MED ORDER — SIMVASTATIN 20 MG PO TABS: 20.0000 mg | ORAL_TABLET | Freq: Every day | ORAL | Status: DC

## 2013-09-08 NOTE — Telephone Encounter (Signed)
rx sent in electronically 

## 2013-09-08 NOTE — Telephone Encounter (Signed)
Pt needs news rxs for simvastatin 20 mg #90 and bisoprolol-hctz #90 with [facility]ills [facility]. Pt has [facility] sch for aug 2015.

## 2013-10-18 ENCOUNTER — Telehealth: Payer: Self-pay

## 2013-10-18 DIAGNOSIS — E119 Type 2 diabetes mellitus without complications: Secondary | ICD-10-CM

## 2013-10-18 MED ORDER — SIMVASTATIN 20 MG PO TABS: 20.0000 mg | ORAL_TABLET | Freq: Every day | ORAL | Status: DC

## 2013-10-18 MED ORDER — GLUCOSE BLOOD VI STRP: ORAL_STRIP | Status: DC

## 2013-10-18 NOTE — Telephone Encounter (Signed)
Pt req refill on glucose blood (ONE TOUCH ULTRA TEST) test strip and req 90 day supply   Refill  Simvastatin and also 90 day supply

## 2013-10-18 NOTE — Telephone Encounter (Signed)
rx sent in electronically 

## 2013-10-19 ENCOUNTER — Telehealth: Payer: Self-pay

## 2013-10-19 DIAGNOSIS — E119 Type 2 diabetes mellitus without complications: Secondary | ICD-10-CM

## 2013-10-19 MED ORDER — SIMVASTATIN 20 MG PO TABS: 20.0000 mg | ORAL_TABLET | Freq: Every day | ORAL | Status: DC

## 2013-10-19 MED ORDER — GLUCOSE BLOOD VI STRP: ORAL_STRIP | Status: DC

## 2013-10-19 NOTE — Telephone Encounter (Signed)
rx sent in electronically 

## 2013-10-19 NOTE — Telephone Encounter (Signed)
Pt call said she need a 90 day supply of glucose blood (ONE TOUCH ULTRA TEST) test strip and not 30 day she cancel  mail order for 30 day supply that was called in. She also need a 90 day supply of simvastatin

## 2013-11-01 ENCOUNTER — Telehealth: Payer: Self-pay | Admitting: Internal Medicine

## 2013-11-01 MED ORDER — SIMVASTATIN 20 MG PO TABS: 20.0000 mg | ORAL_TABLET | Freq: Every day | ORAL | Status: DC

## 2013-11-01 NOTE — Telephone Encounter (Signed)
Pt states she is out of rxsimvastatin (ZOCOR) 20 MG tablet, pt has appt scheduled for 02/25/14, pt needing meds until her appt, send to [facility].

## 2013-11-01 NOTE — Telephone Encounter (Signed)
Rx sent 

## 2013-11-03 ENCOUNTER — Other Ambulatory Visit: Payer: Self-pay | Admitting: Family Medicine

## 2013-11-10 ENCOUNTER — Telehealth: Payer: Self-pay | Admitting: Internal Medicine

## 2013-11-10 DIAGNOSIS — E119 Type 2 diabetes mellitus without complications: Secondary | ICD-10-CM

## 2013-11-10 MED ORDER — GLUCOSE BLOOD VI STRP: ORAL_STRIP | Status: DC

## 2013-11-10 NOTE — Telephone Encounter (Signed)
Pt states she received only a 30 day supply from [facility] for her glucose blood (ONE TOUCH ULTRA TEST) test strip, states it should have been a 90 day supply. Pt requesting that the rx is [facility] for the remaining supply.

## 2013-11-10 NOTE — Telephone Encounter (Signed)
3 month supply of test strips sent to [facility]

## 2013-11-24 ENCOUNTER — Other Ambulatory Visit: Payer: Self-pay

## 2013-11-24 DIAGNOSIS — Z1231 Encounter for screening mammogram for malignant neoplasm of breast: Secondary | ICD-10-CM

## 2013-12-13 ENCOUNTER — Other Ambulatory Visit: Payer: Self-pay | Admitting: Family

## 2013-12-14 ENCOUNTER — Telehealth: Payer: Self-pay | Admitting: Internal Medicine

## 2013-12-14 MED ORDER — METFORMIN HCL 1000 MG PO TABS: 1000.0000 mg | ORAL_TABLET | Freq: Two times a day (BID) | ORAL | Status: DC

## 2013-12-14 MED ORDER — LEVOTHYROXINE SODIUM 137 MCG PO TABS: ORAL_TABLET | ORAL | Status: DC

## 2013-12-14 NOTE — Telephone Encounter (Signed)
Sent to the pharmacy by [facility].  Patient has an upcoming appt with Padonda on 03/04/14

## 2013-12-14 NOTE — Telephone Encounter (Signed)
Pt states she needs her rx levothyroxine (SYNTHROID) 137 MCG tablet And metFORMIN (GLUCOPHAGE) 1000 MG tablet [facility]  90 day each. Pt states required by insurance to fill by [employer]

## 2013-12-15 ENCOUNTER — Ambulatory Visit: Payer: Self-pay

## 2014-01-10 ENCOUNTER — Telehealth: Payer: Self-pay | Admitting: Family

## 2014-01-10 NOTE — Telephone Encounter (Signed)
Unable to fill scripts without a follow up appt, per Padonda. Pt has a refill of [facility]atin at [facility] and [facility]. Advised pt to have those refilled or schedule a f/u appt. Pt verbalized understanding

## 2014-01-10 NOTE — Telephone Encounter (Signed)
Pt needs new rxs simvastatin 20 mg and bisoprolol-hctz 5 mg-6.25mg  #14 each send to [facility] [address] Pt also needs rxs [facility] 90 day supply with refills . Pt is out

## 2014-02-25 ENCOUNTER — Other Ambulatory Visit (INDEPENDENT_AMBULATORY_CARE_PROVIDER_SITE_OTHER): Payer: BC Managed Care – PPO

## 2014-02-25 DIAGNOSIS — Z0000 Encounter for general adult medical examination without abnormal findings: Secondary | ICD-10-CM

## 2014-02-25 LAB — LIPID PANEL
CHOLESTEROL: 80 mg/dL (ref 0–200)
HDL: 22.9 mg/dL — ABNORMAL LOW (ref >39.00)
LDL Cholesterol: 41 mg/dL (ref 0–99)
NONHDL: 57.1
Total CHOL/HDL Ratio: 3
Triglycerides: 82 mg/dL (ref 0.0–149.0)
VLDL: 16.4 mg/dL (ref 0.0–40.0)

## 2014-02-25 LAB — BASIC METABOLIC PANEL
BUN: 9 mg/dL (ref 6–23)
CALCIUM: 9.1 mg/dL (ref 8.4–10.5)
CO2: 32 meq/L (ref 19–32)
CREATININE: 0.7 mg/dL (ref 0.4–1.2)
Chloride: 105 mEq/L (ref 96–112)
GFR: 117.99 mL/min (ref >60.00)
Glucose, Bld: 140 mg/dL — ABNORMAL HIGH (ref 70–99)
Potassium: 3.8 mEq/L (ref 3.5–5.1)
Sodium: 142 mEq/L (ref 135–145)

## 2014-02-25 LAB — CBC WITH DIFFERENTIAL/PLATELET
Basophils Absolute: 0 10*3/uL (ref 0.0–0.1)
Basophils Relative: 0.2 % (ref 0.0–3.0)
Eosinophils Absolute: 0.2 10*3/uL (ref 0.0–0.7)
Eosinophils Relative: 2.1 % (ref 0.0–5.0)
HCT: 39.4 % (ref 36.0–46.0)
Hemoglobin: 12.7 g/dL (ref 12.0–15.0)
Lymphocytes Relative: 33.4 % (ref 12.0–46.0)
Lymphs Abs: 2.9 10*3/uL (ref 0.7–4.0)
MCHC: 32.3 g/dL (ref 30.0–36.0)
MCV: 92 fl (ref 78.0–100.0)
MONO ABS: 0.7 10*3/uL (ref 0.1–1.0)
Monocytes Relative: 7.6 % (ref 3.0–12.0)
Neutro Abs: 5 10*3/uL (ref 1.4–7.7)
Neutrophils Relative %: 56.7 % (ref 43.0–77.0)
PLATELETS: 358 10*3/uL (ref 150.0–400.0)
RBC: 4.29 Mil/uL (ref 3.87–5.11)
RDW: 14.4 % (ref 11.5–15.5)
WBC: 8.8 10*3/uL (ref 4.0–10.5)

## 2014-02-25 LAB — HEMOGLOBIN A1C: Hgb A1c MFr Bld: 7.8 % — ABNORMAL HIGH (ref 4.6–6.5)

## 2014-02-25 LAB — POCT URINALYSIS DIPSTICK
Blood, UA: NEGATIVE
GLUCOSE UA: NEGATIVE
Ketones, UA: NEGATIVE
LEUKOCYTES UA: NEGATIVE
NITRITE UA: NEGATIVE
Spec Grav, UA: 1.02
Urobilinogen, UA: 4
pH, UA: 5

## 2014-02-25 LAB — TSH: TSH: 1.49 u[IU]/mL (ref 0.35–4.50)

## 2014-02-25 LAB — MICROALBUMIN / CREATININE URINE RATIO
Creatinine,U: 221.3 mg/dL
Microalb Creat Ratio: 0.8 mg/g (ref 0.0–30.0)
Microalb, Ur: 1.7 mg/dL (ref 0.0–1.9)

## 2014-02-25 LAB — HEPATIC FUNCTION PANEL
ALK PHOS: 59 U/L (ref 39–117)
ALT: 27 U/L (ref 0–35)
AST: 31 U/L (ref 0–37)
Albumin: 4 g/dL (ref 3.5–5.2)
BILIRUBIN TOTAL: 0.7 mg/dL (ref 0.2–1.2)
Bilirubin, Direct: 0.1 mg/dL (ref 0.0–0.3)
Total Protein: 7.2 g/dL (ref 6.0–8.3)

## 2014-03-04 ENCOUNTER — Encounter: Payer: Self-pay | Admitting: Internal Medicine

## 2014-03-04 ENCOUNTER — Encounter: Payer: Self-pay | Admitting: Family

## 2014-03-09 ENCOUNTER — Encounter: Payer: Self-pay | Admitting: Family

## 2014-03-09 ENCOUNTER — Ambulatory Visit (INDEPENDENT_AMBULATORY_CARE_PROVIDER_SITE_OTHER): Payer: BC Managed Care – PPO | Admitting: Family

## 2014-03-09 ENCOUNTER — Telehealth: Payer: Self-pay | Admitting: Family

## 2014-03-09 VITALS — BP 112/78 | HR 81 | Temp 98.6°F | Ht 62.0 in | Wt 183.0 lb

## 2014-03-09 DIAGNOSIS — Z0000 Encounter for general adult medical examination without abnormal findings: Secondary | ICD-10-CM

## 2014-03-09 DIAGNOSIS — E039 Hypothyroidism, unspecified: Secondary | ICD-10-CM

## 2014-03-09 DIAGNOSIS — I10 Essential (primary) hypertension: Secondary | ICD-10-CM

## 2014-03-09 DIAGNOSIS — E663 Overweight: Secondary | ICD-10-CM

## 2014-03-09 DIAGNOSIS — IMO0001 Reserved for inherently not codable concepts without codable children: Secondary | ICD-10-CM

## 2014-03-09 DIAGNOSIS — E1165 Type 2 diabetes mellitus with hyperglycemia: Secondary | ICD-10-CM

## 2014-03-09 DIAGNOSIS — F319 Bipolar disorder, unspecified: Secondary | ICD-10-CM

## 2014-03-09 MED ORDER — GLIPIZIDE 10 MG PO TABS: 10.0000 mg | ORAL_TABLET | Freq: Two times a day (BID) | ORAL | Status: DC

## 2014-03-09 MED ORDER — METFORMIN HCL 1000 MG PO TABS: 1000.0000 mg | ORAL_TABLET | Freq: Two times a day (BID) | ORAL | Status: DC

## 2014-03-09 MED ORDER — SIMVASTATIN 20 MG PO TABS: 20.0000 mg | ORAL_TABLET | Freq: Every day | ORAL | Status: DC

## 2014-03-09 NOTE — Telephone Encounter (Signed)
Relevant patient education assigned to patient using Emmi. ° °

## 2014-03-09 NOTE — Progress Notes (Signed)
Subjective:    Patient ID: Alexandra [name], female    DOB: 01-Jul-1961, 53 y.o.   MRN: [id number]  HPI 53 year old African American female, nonsmoker with a history of bipolar disorder, hypertension, type 2 diabetes-uncontrolled, hypothyroidism is in today for complete physical exam. She's in the care of psychiatry for management of bipolar disorder.   This is a routine wellness  examination for this patient . I reviewed all health maintenance protocols including mammography, colonoscopy, bone density Needed referrals were placed. Age and diagnosis  appropriate screening labs were ordered. Her immunization history was reviewed and appropriate vaccinations were ordered. Her current medications and allergies were reviewed and needed refills of her chronic medications were ordered. The plan for yearly health maintenance was discussed all orders and referrals were made as appropriate.   Review of Systems  Constitutional: Negative.   HENT: Negative.   Eyes: Negative.   Respiratory: Negative.   Cardiovascular: Negative.   Gastrointestinal: Negative.   Endocrine: Negative.   Genitourinary: Negative.   Musculoskeletal: Negative.   Skin: Negative.   Neurological: Negative.   Hematological: Negative.   Psychiatric/Behavioral: Negative.    Past Medical History  Diagnosis Date  . GERD (gastroesophageal reflux disease)     prn med.  . Hypothyroidism   . Hypertension     new dx. - will start med. 06/28/2011  . Sleep apnea     prior to gastric bypass - no sleep study since bypass  . Rotator cuff tear, right   . Diabetes mellitus   . Hyperlipidemia   . Hiatal hernia 1998  . Hyperplastic colon polyp   . Fatty liver   . Hyperthyroidism     History   Social History  . Marital Status: Married    Spouse Name: N/A    Number of Children: N/A  . Years of Education: N/A   Occupational History  . Not on file.   Social History Main Topics  . Smoking status: Never Smoker   . Smokeless  tobacco: Never Used  . Alcohol Use: No  . Drug Use: No  . Sexual Activity: Yes   Other Topics Concern  . Not on file   Social History Narrative  . No narrative on file    Past Surgical History  Procedure Laterality Date  . Cesarean section  1991  . Cholecystectomy  1989  . Shoulder arthroscopy distal clavicle excision and open rotator cuff repair  07/26/2010    right  . Shoulder arthroscopy  01/02/2006; 05/31/2004    left 2007; right 2005  . Exploratory laparotomy  05/08/2000    right salpingo-oophorectomy  . Abdominal hysterectomy  06/25/2000    left salpingo-oophorectomy  . 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 Laminotomy / excision disk posterior cervical spine  08/10/2009    C7-T1  . Thyroid surgery  early 2000s    goiter  . Cardiac catheterization  04/01/2006  . Shoulder arthroscopy  07/02/2011    Procedure: ARTHROSCOPY SHOULDER;  Surgeon: Cammie Sickle., MD;  Location: [facility];  Service: Orthopedics;  Laterality: Right;  repair supraspinatus, Debride glenohumeral joint, labrum  . Back surgery  1997    discetomy, lumbar spinal fusion  . Tubal ligation      Family History  Problem Relation Age of Onset  . Coronary artery disease      Allergies  Allergen Reactions  . Shrimp Flavor Swelling    Swelling of tongue and rash  . Ace Inhibitors Cough  . Oxycodone-Acetaminophen  Rash    Current Outpatient Prescriptions on File Prior to Visit  Medication Sig Dispense Refill  . aspirin 325 MG tablet Take 1 tablet (325 mg total) by mouth daily. For heart health  30 tablet  3  . bisoprolol-hydrochlorothiazide (ZIAC) 5-6.25 MG per tablet Take 1 tablet by mouth daily. For high blood pressure control  90 tablet  1  . clotrimazole-betamethasone (LOTRISONE) cream Apply topically 2 (two) times daily.  30 g  0  . glucose blood (ONE TOUCH ULTRA TEST) test strip Use as instructed  300 each  4  . levothyroxine (SYNTHROID) 137 MCG tablet Take 1 tablet (137 mcg  total) by mouth daily before breakfast.  90 tablet  3  . lidocaine (LIDODERM) 5 % Place 1 patch onto the skin daily. Remove & Discard patch within 12 hours or as directed by MD: For pain  90 patch  3  . nitroGLYCERIN (NITROSTAT) 0.4 MG SL tablet Place 1 tablet (0.4 mg total) under the tongue every 5 (five) minutes as needed for chest pain (CP or SOB).  60 tablet  12  . [DISCONTINUED] DULoxetine (CYMBALTA) 20 MG capsule Take 20 mg by mouth daily. Samples per Dr Arnoldo Morale      . [DISCONTINUED] gabapentin (NEURONTIN) 300 MG capsule Take 300 mg by mouth 2 (two) times daily. Per Dr Daylene Katayama       No current facility-administered medications on file prior to visit.    BP 112/78  Pulse 81  Temp(Src) 98.6 F (37 C) (Oral)  Ht 5\' 2"  (1.575 m)  Wt 183 lb (83.008 kg)  BMI 33.46 kg/m2  SpO2 97%chart    Objective:   Physical Exam  Constitutional: She is oriented to person, place, and time. She appears well-developed and well-nourished.  HENT:  Head: Normocephalic.  Right Ear: External ear normal.  Left Ear: External ear normal.  Nose: Nose normal.  Mouth/Throat: Oropharynx is clear and moist.  Eyes: Conjunctivae and EOM are normal. Pupils are equal, round, and reactive to light.  Neck: Normal range of motion. Neck supple.  Cardiovascular: Normal rate, regular rhythm and normal heart sounds.   Pulmonary/Chest: Effort normal and breath sounds normal.  Abdominal: Soft. Bowel sounds are normal.  Musculoskeletal: Normal range of motion.  Neurological: She is alert and oriented to person, place, and time. She has normal reflexes.  Skin: Skin is warm and dry.  Psychiatric: She has a normal mood and affect.          Assessment & Plan:  [employer] was seen today for annual exam.  Diagnoses and associated orders for this visit:  Preventative health care  Unspecified essential hypertension  Unspecified hypothyroidism  Bipolar disorder, unspecified  Overweight  Type II or unspecified type  diabetes mellitus without mention of complication, uncontrolled  Other Orders - glipiZIDE (GLUCOTROL) 10 MG tablet; Take 1 tablet (10 mg total) by mouth 2 (two) times daily before a meal. - metFORMIN (GLUCOPHAGE) 1000 MG tablet; Take 1 tablet (1,000 mg total) by mouth 2 (two) times daily with a meal. - simvastatin (ZOCOR) 20 MG tablet; Take 1 tablet (20 mg total) by mouth at bedtime. For high cholesterol control     call the office with any questions or concerns. Recheck in 3 months and sooner as needed.

## 2014-03-09 NOTE — Progress Notes (Signed)
Pre visit review using our clinic review tool, if applicable. No additional management support is needed unless otherwise documented below in the visit note. 

## 2014-03-09 NOTE — Patient Instructions (Signed)
Diabetes and Standards of Medical Care Diabetes is complicated. You may find that your diabetes team includes a dietitian, nurse, diabetes educator, eye doctor, and more. To help everyone know what is going on and to help you get the care you deserve, the following schedule of care was developed to help keep you on track. Below are the tests, exams, vaccines, medicines, education, and plans you will need. HbA1c test This test shows how well you have controlled your glucose over the past 2-3 months. It is used to see if your diabetes management plan needs to be adjusted.   It is performed at least 2 times a year if you are meeting treatment goals.  It is performed 4 times a year if therapy has changed or if you are not meeting treatment goals. Blood pressure test  This test is performed at every routine medical visit. The goal is less than 140/90 mm Hg for most people, but 130/80 mm Hg in some cases. Ask your health care provider about your goal. Dental exam  Follow up with the dentist regularly. Eye exam  If you are diagnosed with type 1 diabetes as a child, get an exam upon reaching the age of 37 years or older and have had diabetes for 3-5 years. Yearly eye exams are recommended after that initial eye exam.  If you are diagnosed with type 1 diabetes as an adult, get an exam within 5 years of diagnosis and then yearly.  If you are diagnosed with type 2 diabetes, get an exam as soon as possible after the diagnosis and then yearly. Foot care exam  Visual foot exams are performed at every routine medical visit. The exams check for cuts, injuries, or other problems with the feet.  A comprehensive foot exam should be done yearly. This includes visual inspection as well as assessing foot pulses and testing for loss of sensation.  Check your feet nightly for cuts, injuries, or other problems with your feet. Tell your health care provider if anything is not healing. Kidney function test (urine  microalbumin)  This test is performed once a year.  Type 1 diabetes: The first test is performed 5 years after diagnosis.  Type 2 diabetes: The first test is performed at the time of diagnosis.  A serum creatinine and estimated glomerular filtration rate (eGFR) test is done once a year to assess the level of chronic kidney disease (CKD), if present. Lipid profile (cholesterol, HDL, LDL, triglycerides)  Performed every 5 years for most people.  The goal for LDL is less than 100 mg/dL. If you are at high risk, the goal is less than 70 mg/dL.  The goal for HDL is 40 mg/dL-50 mg/dL for men and 50 mg/dL-60 mg/dL for women. An HDL cholesterol of 60 mg/dL or higher gives some protection against heart disease.  The goal for triglycerides is less than 150 mg/dL. Influenza vaccine, pneumococcal vaccine, and hepatitis B vaccine  The influenza vaccine is recommended yearly.  It is recommended that people with diabetes who are over 24 years old get the pneumonia vaccine. In some cases, two separate shots may be given. Ask your health care provider if your pneumonia vaccination is up to date.  The hepatitis B vaccine is also recommended for adults with diabetes. Diabetes self-management education  Education is recommended at diagnosis and ongoing as needed. Treatment plan  Your treatment plan is reviewed at every medical visit. Document Released: 05/12/2009 Document Revised: 11/29/2013 Document Reviewed: 12/15/2012 [facility] Patient Information 2015 [place],  [employer]. This information is not intended to replace advice given to you by your health care provider. Make sure you discuss any questions you have with your health care provider.  

## 2014-03-15 ENCOUNTER — Encounter: Payer: Self-pay | Admitting: Family

## 2014-03-22 ENCOUNTER — Other Ambulatory Visit: Payer: Self-pay | Admitting: Internal Medicine

## 2014-04-12 ENCOUNTER — Ambulatory Visit
Admission: RE | Admit: 2014-04-12 | Discharge: 2014-04-12 | Disposition: A | Discharge status: Home / Self Care | Payer: BC Managed Care – PPO | Source: Ambulatory Visit

## 2014-04-12 DIAGNOSIS — Z1231 Encounter for screening mammogram for malignant neoplasm of breast: Secondary | ICD-10-CM

## 2014-05-14 ENCOUNTER — Encounter: Payer: Self-pay | Admitting: Family

## 2014-05-16 ENCOUNTER — Other Ambulatory Visit: Payer: Self-pay

## 2014-05-16 MED ORDER — GLIPIZIDE 10 MG PO TABS: 10.0000 mg | ORAL_TABLET | Freq: Two times a day (BID) | ORAL | Status: DC

## 2014-05-16 MED ORDER — LEVOTHYROXINE SODIUM 137 MCG PO TABS: 137.0000 ug | ORAL_TABLET | Freq: Every day | ORAL | Status: DC

## 2014-06-02 ENCOUNTER — Other Ambulatory Visit: Payer: Self-pay | Admitting: Family

## 2014-06-20 ENCOUNTER — Encounter: Payer: Self-pay | Admitting: Family

## 2014-06-20 ENCOUNTER — Ambulatory Visit (INDEPENDENT_AMBULATORY_CARE_PROVIDER_SITE_OTHER): Payer: BC Managed Care – PPO | Admitting: Family

## 2014-06-20 ENCOUNTER — Ambulatory Visit (INDEPENDENT_AMBULATORY_CARE_PROVIDER_SITE_OTHER): Payer: BC Managed Care – PPO

## 2014-06-20 VITALS — BP 120/82 | HR 79 | Wt 170.0 lb

## 2014-06-20 DIAGNOSIS — IMO0002 Reserved for concepts with insufficient information to code with codable children: Secondary | ICD-10-CM

## 2014-06-20 DIAGNOSIS — E7800 Pure hypercholesterolemia, unspecified: Secondary | ICD-10-CM

## 2014-06-20 DIAGNOSIS — Z23 Encounter for immunization: Secondary | ICD-10-CM

## 2014-06-20 DIAGNOSIS — E039 Hypothyroidism, unspecified: Secondary | ICD-10-CM

## 2014-06-20 DIAGNOSIS — E1165 Type 2 diabetes mellitus with hyperglycemia: Secondary | ICD-10-CM

## 2014-06-20 LAB — BASIC METABOLIC PANEL
BUN: 10 mg/dL (ref 6–23)
CO2: 29 mEq/L (ref 19–32)
Calcium: 9.4 mg/dL (ref 8.4–10.5)
Chloride: 102 mEq/L (ref 96–112)
Creatinine, Ser: 0.6 mg/dL (ref 0.4–1.2)
GFR: 128.88 mL/min (ref >60.00)
Glucose, Bld: 52 mg/dL — ABNORMAL LOW (ref 70–99)
Potassium: 3.9 mEq/L (ref 3.5–5.1)
Sodium: 142 mEq/L (ref 135–145)

## 2014-06-20 LAB — LIPID PANEL
CHOL/HDL RATIO: 4
Cholesterol: 138 mg/dL (ref 0–200)
HDL: 32.7 mg/dL — AB (ref >39.00)
LDL Cholesterol: 84 mg/dL (ref 0–99)
NonHDL: 105.3
Triglycerides: 105 mg/dL (ref 0.0–149.0)
VLDL: 21 mg/dL (ref 0.0–40.0)

## 2014-06-20 LAB — HEPATIC FUNCTION PANEL
ALT: 23 U/L (ref 0–35)
AST: 28 U/L (ref 0–37)
Albumin: 4 g/dL (ref 3.5–5.2)
Alkaline Phosphatase: 60 U/L (ref 39–117)
Bilirubin, Direct: 0 mg/dL (ref 0.0–0.3)
Total Bilirubin: 0.6 mg/dL (ref 0.2–1.2)
Total Protein: 7.2 g/dL (ref 6.0–8.3)

## 2014-06-20 LAB — TSH: TSH: 0.65 u[IU]/mL (ref 0.35–4.50)

## 2014-06-20 LAB — HEMOGLOBIN A1C: Hgb A1c MFr Bld: 6 % (ref 4.6–6.5)

## 2014-06-20 MED ORDER — SIMVASTATIN 20 MG PO TABS: 20.0000 mg | ORAL_TABLET | Freq: Every day | ORAL | Status: DC

## 2014-06-20 NOTE — Patient Instructions (Signed)
Diabetes and Exercise Exercising regularly is important. It is not just about losing weight. It has many health benefits, such as:  Improving your overall fitness, flexibility, and endurance.  Increasing your bone density.  Helping with weight control.  Decreasing your body fat.  Increasing your muscle strength.  Reducing stress and tension.  Improving your overall health. People with diabetes who exercise gain additional benefits because exercise:  Reduces appetite.  Improves the body's use of blood sugar (glucose).  Helps lower or control blood glucose.  Decreases blood pressure.  Helps control blood lipids (such as cholesterol and triglycerides).  Improves the body's use of the hormone insulin by:  Increasing the body's insulin sensitivity.  Reducing the body's insulin needs.  Decreases the risk for heart disease because exercising:  Lowers cholesterol and triglycerides levels.  Increases the levels of good cholesterol (such as high-density lipoproteins [HDL]) in the body.  Lowers blood glucose levels. YOUR ACTIVITY PLAN  Choose an activity that you enjoy and set realistic goals. Your health care provider or diabetes educator can help you make an activity plan that works for you. Exercise regularly as directed by your health care provider. This includes:  Performing resistance training twice a week such as push-ups, sit-ups, lifting weights, or using resistance bands.  Performing 150 minutes of cardio exercises each week such as walking, running, or playing sports.  Staying active and spending no more than 90 minutes at one time being inactive. Even short bursts of exercise are good for you. Three 10-minute sessions spread throughout the day are just as beneficial as a single 30-minute session. Some exercise ideas include:  Taking the dog for a walk.  Taking the stairs instead of the elevator.  Dancing to your favorite song.  Doing an exercise  video.  Doing your favorite exercise with a friend. RECOMMENDATIONS FOR EXERCISING WITH TYPE 1 OR TYPE 2 DIABETES   Check your blood glucose before exercising. If blood glucose levels are greater than 240 mg/dL, check for urine ketones. Do not exercise if ketones are present.  Avoid injecting insulin into areas of the body that are going to be exercised. For example, avoid injecting insulin into:  The arms when playing tennis.  The legs when jogging.  Keep a record of:  Food intake before and after you exercise.  Expected peak times of insulin action.  Blood glucose levels before and after you exercise.  The type and amount of exercise you have done.  Review your records with your health care provider. Your health care provider will help you to develop guidelines for adjusting food intake and insulin amounts before and after exercising.  If you take insulin or oral hypoglycemic agents, watch for signs and symptoms of hypoglycemia. They include:  Dizziness.  Shaking.  Sweating.  Chills.  Confusion.  Drink plenty of water while you exercise to prevent dehydration or heat stroke. Body water is lost during exercise and must be replaced.  Talk to your health care provider before starting an exercise program to make sure it is safe for you. Remember, almost any type of activity is better than none. Document Released: 10/05/2003 Document Revised: 11/29/2013 Document Reviewed: 12/22/2012 [employer] Patient Information 2015 [employer]. This information is not intended to replace advice given to you by your health care provider. Make sure you discuss any questions you have with your health care provider.  

## 2014-06-20 NOTE — Progress Notes (Signed)
Subjective:    Patient ID: Alexandra [name], female    DOB: 12/06/1960, 53 y.o.   MRN: [id number]  HPI  53 year old and today for recheck. Has a history of depression, hypothyroidism, hyperlipidemia, type 2 diabetes and bipolar disorder. Reports doing well. Not currently exercising or following any particular diet. Blood glucose is running 180-220.  Review of Systems  Constitutional: Negative.   HENT: Negative.   Respiratory: Negative.   Cardiovascular: Negative.   Gastrointestinal: Negative.   Endocrine: Negative.   Genitourinary: Negative.   Musculoskeletal: Negative.   Skin: Negative.   Allergic/Immunologic: Negative.   Neurological: Negative.   Hematological: Negative.   Psychiatric/Behavioral: Negative.    Past Medical History  Diagnosis Date  . GERD (gastroesophageal reflux disease)     prn med.  . Hypothyroidism   . Hypertension     new dx. - will start med. 06/28/2011  . Sleep apnea     prior to gastric bypass - no sleep study since bypass  . Rotator cuff tear, right   . Diabetes mellitus   . Hyperlipidemia   . Hiatal hernia 1998  . Hyperplastic colon polyp   . Fatty liver   . Hyperthyroidism     History   Social History  . Marital Status: Married    Spouse Name: N/A    Number of Children: N/A  . Years of Education: N/A   Occupational History  . Not on file.   Social History Main Topics  . Smoking status: Never Smoker   . Smokeless tobacco: Never Used  . Alcohol Use: No  . Drug Use: No  . Sexual Activity: Yes   Other Topics Concern  . Not on file   Social History Narrative    Past Surgical History  Procedure Laterality Date  . Cesarean section  1991  . Cholecystectomy  1989  . Shoulder arthroscopy distal clavicle excision and open rotator cuff repair  07/26/2010    right  . Shoulder arthroscopy  01/02/2006; 05/31/2004    left 2007; right 2005  . Exploratory laparotomy  05/08/2000    right salpingo-oophorectomy  . Abdominal hysterectomy   06/25/2000    left salpingo-oophorectomy  . 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 Laminotomy / excision disk posterior cervical spine  08/10/2009    C7-T1  . Thyroid surgery  early 2000s    goiter  . Cardiac catheterization  04/01/2006  . Shoulder arthroscopy  07/02/2011    Procedure: ARTHROSCOPY SHOULDER;  Surgeon: Cammie Sickle., MD;  Location: [facility];  Service: Orthopedics;  Laterality: Right;  repair supraspinatus, Debride glenohumeral joint, labrum  . Back surgery  1997    discetomy, lumbar spinal fusion  . Tubal ligation      Family History  Problem Relation Age of Onset  . Coronary artery disease      Allergies  Allergen Reactions  . Shrimp Flavor Swelling    Swelling of tongue and rash  . Ace Inhibitors Cough  . Oxycodone-Acetaminophen Rash    Current Outpatient Prescriptions on File Prior to Visit  Medication Sig Dispense Refill  . aspirin 325 MG tablet Take 1 tablet (325 mg total) by mouth daily. For heart health 30 tablet 3  . benztropine (COGENTIN) 1 MG tablet Take 1 mg by mouth 2 (two) times daily. For prevention of drug related involuntary movements    . bisoprolol-hydrochlorothiazide (ZIAC) 5-6.25 MG per tablet TAKE 1 TABLET DAILY FOR HIGH BLOOD PRESSURE CONTROL 90 tablet 0  .  citalopram (CELEXA) 40 MG tablet Take 40 mg by mouth daily.     . clotrimazole-betamethasone (LOTRISONE) cream Apply topically 2 (two) times daily. 30 g 0  . glipiZIDE (GLUCOTROL) 10 MG tablet Take 1 tablet (10 mg total) by mouth 2 (two) times daily before a meal. 180 tablet 0  . glucose blood (ONE TOUCH ULTRA TEST) test strip Use as instructed 300 each 4  . HYDROcodone-acetaminophen (NORCO) 10-325 MG per tablet Take 1 tablet by mouth every 6 (six) hours as needed.     Marland Kitchen levothyroxine (SYNTHROID) 137 MCG tablet Take 1 tablet (137 mcg total) by mouth daily before breakfast. 90 tablet 0  . lidocaine (LIDODERM) 5 % Place 1 patch onto the skin daily. Remove & Discard  patch within 12 hours or as directed by MD: For pain 90 patch 3  . metFORMIN (GLUCOPHAGE) 1000 MG tablet Take 1 tablet (1,000 mg total) by mouth 2 (two) times daily with a meal. 180 tablet 1  . nitroGLYCERIN (NITROSTAT) 0.4 MG SL tablet Place 1 tablet (0.4 mg total) under the tongue every 5 (five) minutes as needed for chest pain (CP or SOB). 60 tablet 12  . OLANZapine (ZYPREXA) 20 MG tablet Take 20 mg by mouth at bedtime.     . traZODone (DESYREL) 150 MG tablet Take 300 mg by mouth at bedtime. For sleep    . [DISCONTINUED] DULoxetine (CYMBALTA) 20 MG capsule Take 20 mg by mouth daily. Samples per Dr Arnoldo Morale    . [DISCONTINUED] gabapentin (NEURONTIN) 300 MG capsule Take 300 mg by mouth 2 (two) times daily. Per Dr Daylene Katayama     No current facility-administered medications on file prior to visit.    BP 120/82 mmHg  Pulse 79  Wt 170 lb (77.111 kg)chart    Objective:   Physical Exam  Constitutional: She is oriented to person, place, and time. She appears well-developed and well-nourished.  HENT:  Right Ear: External ear normal.  Left Ear: External ear normal.  Nose: Nose normal.  Mouth/Throat: Oropharynx is clear and moist.  Neck: Normal range of motion. Neck supple. No thyromegaly present.  Cardiovascular: Normal rate, regular rhythm and normal heart sounds.   Pulmonary/Chest: Effort normal and breath sounds normal.  Abdominal: Soft. Bowel sounds are normal.  Musculoskeletal: Normal range of motion.  Neurological: She is alert and oriented to person, place, and time.  Skin: Skin is warm and dry.  Psychiatric: She has a normal mood and affect.          Assessment & Plan:  Valita was seen today for follow-up.  Diagnoses and associated orders for this visit:  Pure hypercholesterolemia - Basic Metabolic Panel - Hepatic Function Panel - Lipid Panel  Type 2 diabetes mellitus, uncontrolled - Hemoglobin L2X - Basic Metabolic Panel - Hepatic Function Panel - Lipid  Panel  Hypothyroidism, unspecified hypothyroidism type - TSH  Other Orders - simvastatin (ZOCOR) 20 MG tablet; Take 1 tablet (20 mg total) by mouth at bedtime. For high cholesterol control   Call the office with any questions or concerns. Recheck in 3-4 months and sooner as needed.

## 2014-06-20 NOTE — Progress Notes (Signed)
Pre visit review using our clinic review tool, if applicable. No additional management support is needed unless otherwise documented below in the visit note. 

## 2014-08-12 ENCOUNTER — Other Ambulatory Visit: Payer: Self-pay | Admitting: Family

## 2014-09-20 ENCOUNTER — Ambulatory Visit: Payer: Self-pay | Admitting: Family

## 2014-09-22 ENCOUNTER — Encounter: Payer: Self-pay | Admitting: Family

## 2014-09-22 ENCOUNTER — Ambulatory Visit (INDEPENDENT_AMBULATORY_CARE_PROVIDER_SITE_OTHER): Payer: BLUE CROSS/BLUE SHIELD | Admitting: Family

## 2014-09-22 VITALS — BP 140/82 | HR 68 | Temp 98.2°F | Resp 18 | Wt 178.3 lb

## 2014-09-22 DIAGNOSIS — IMO0002 Reserved for concepts with insufficient information to code with codable children: Secondary | ICD-10-CM

## 2014-09-22 DIAGNOSIS — E785 Hyperlipidemia, unspecified: Secondary | ICD-10-CM

## 2014-09-22 DIAGNOSIS — E1165 Type 2 diabetes mellitus with hyperglycemia: Secondary | ICD-10-CM

## 2014-09-22 DIAGNOSIS — E039 Hypothyroidism, unspecified: Secondary | ICD-10-CM

## 2014-09-22 DIAGNOSIS — E669 Obesity, unspecified: Secondary | ICD-10-CM

## 2014-09-22 LAB — BASIC METABOLIC PANEL
BUN: 7 mg/dL (ref 6–23)
CHLORIDE: 104 meq/L (ref 96–112)
CO2: 31 meq/L (ref 19–32)
Calcium: 9.2 mg/dL (ref 8.4–10.5)
Creatinine, Ser: 0.69 mg/dL (ref 0.40–1.20)
GFR: 113.81 mL/min (ref >60.00)
Glucose, Bld: 112 mg/dL — ABNORMAL HIGH (ref 70–99)
POTASSIUM: 3.7 meq/L (ref 3.5–5.1)
SODIUM: 142 meq/L (ref 135–145)

## 2014-09-22 LAB — LIPID PANEL
CHOLESTEROL: 105 mg/dL (ref 0–200)
HDL: 38.7 mg/dL — ABNORMAL LOW (ref >39.00)
LDL Cholesterol: 54 mg/dL (ref 0–99)
NonHDL: 66.3
Total CHOL/HDL Ratio: 3
Triglycerides: 62 mg/dL (ref 0.0–149.0)
VLDL: 12.4 mg/dL (ref 0.0–40.0)

## 2014-09-22 LAB — TSH: TSH: 1.01 u[IU]/mL (ref 0.35–4.50)

## 2014-09-22 LAB — HEPATIC FUNCTION PANEL
ALT: 27 U/L (ref 0–35)
AST: 25 U/L (ref 0–37)
Albumin: 3.9 g/dL (ref 3.5–5.2)
Alkaline Phosphatase: 74 U/L (ref 39–117)
Bilirubin, Direct: 0.1 mg/dL (ref 0.0–0.3)
TOTAL PROTEIN: 7 g/dL (ref 6.0–8.3)
Total Bilirubin: 0.4 mg/dL (ref 0.2–1.2)

## 2014-09-22 LAB — HEMOGLOBIN A1C: Hgb A1c MFr Bld: 6.2 % (ref 4.6–6.5)

## 2014-09-22 NOTE — Progress Notes (Signed)
Subjective:    Patient ID: Alexandra [name], female    DOB: [birth year], 54 y.o.   MRN: [id number]  HPI  Patient 54 year old, AA, female, seen for recheck of blood pressure and type 2 diabetes.  Past medical history  includes depression, hypothyroidism, and hyperlipidemia.  Elevated blood glucose readings (highest over past month 200-215).  Reports no longer using one touch meter and will purchase a cheaper monitor and strips at [facility]. No longer can afford one touch meter strips co-pay.  Blood pressure is not monitored at home. Denies any visual changes or new onset headaches.  Denies any dietary changes.   Review of Systems  Constitutional: Negative.   HENT: Negative.   Eyes: Negative.   Respiratory: Negative.   Cardiovascular: Negative.   Gastrointestinal: Negative.   Endocrine: Negative.   Genitourinary: Negative.   Musculoskeletal: Negative.   Skin: Negative.   Allergic/Immunologic: Negative.   Neurological: Negative.   Hematological: Negative.   Psychiatric/Behavioral: Negative.         Past Medical History  Diagnosis Date  . GERD (gastroesophageal reflux disease)     prn med.  . Hypothyroidism   . Hypertension     new dx. - will start med. 06/28/2011  . Sleep apnea     prior to gastric bypass - no sleep study since bypass  . Rotator cuff tear, right   . Diabetes mellitus   . Hyperlipidemia   . Hiatal hernia 1998  . Hyperplastic colon polyp   . Fatty liver   . Hyperthyroidism     History   Social History  . Marital Status: Married    Spouse Name: N/A  . Number of Children: N/A  . Years of Education: N/A   Occupational History  . Not on file.   Social History Main Topics  . Smoking status: Never Smoker   . Smokeless tobacco: Never Used  . Alcohol Use: No  . Drug Use: No  . Sexual Activity: Yes   Other Topics Concern  . Not on file   Social History Narrative    Past Surgical History  Procedure Laterality Date  . Cesarean section  1991   . Cholecystectomy  1989  . Shoulder arthroscopy distal clavicle excision and open rotator cuff repair  07/26/2010    right  . Shoulder arthroscopy  01/02/2006; 05/31/2004    left 2007; right 2005  . Exploratory laparotomy  05/08/2000    right salpingo-oophorectomy  . Abdominal hysterectomy  06/25/2000    left salpingo-oophorectomy  . 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 Laminotomy / excision disk posterior cervical spine  08/10/2009    C7-T1  . Thyroid surgery  early 2000s    goiter  . Cardiac catheterization  04/01/2006  . Shoulder arthroscopy  07/02/2011    Procedure: ARTHROSCOPY SHOULDER;  Surgeon: Cammie Sickle., MD;  Location: [facility];  Service: Orthopedics;  Laterality: Right;  repair supraspinatus, Debride glenohumeral joint, labrum  . Back surgery  1997    discetomy, lumbar spinal fusion  . Tubal ligation      Family History  Problem Relation Age of Onset  . Coronary artery disease      Allergies  Allergen Reactions  . Shrimp Flavor Swelling    Swelling of tongue and rash  . Ace Inhibitors Cough  . Oxycodone-Acetaminophen Rash    Current Outpatient Prescriptions on File Prior to Visit  Medication Sig Dispense Refill  . aspirin 325 MG tablet Take 1 tablet (  325 mg total) by mouth daily. For heart health 30 tablet 3  . benztropine (COGENTIN) 1 MG tablet Take 1 mg by mouth 2 (two) times daily. For prevention of drug related involuntary movements    . bisoprolol-hydrochlorothiazide (ZIAC) 5-6.25 MG per tablet TAKE 1 TABLET DAILY FOR HIGH BLOOD PRESSURE CONTROL 90 tablet 1  . citalopram (CELEXA) 40 MG tablet Take 40 mg by mouth daily.     . clotrimazole-betamethasone (LOTRISONE) cream Apply topically 2 (two) times daily. 30 g 0  . glipiZIDE (GLUCOTROL) 10 MG tablet Take 1 tablet (10 mg total) by mouth 2 (two) times daily before a meal. 180 tablet 0  . glucose blood (ONE TOUCH ULTRA TEST) test strip Use as instructed 300 each 4  .  HYDROcodone-acetaminophen (NORCO) 10-325 MG per tablet Take 1 tablet by mouth every 6 (six) hours as needed.     Marland Kitchen levothyroxine (SYNTHROID) 137 MCG tablet Take 1 tablet (137 mcg total) by mouth daily before breakfast. 90 tablet 0  . lidocaine (LIDODERM) 5 % Place 1 patch onto the skin daily. Remove & Discard patch within 12 hours or as directed by MD: For pain 90 patch 3  . metFORMIN (GLUCOPHAGE) 1000 MG tablet Take 1 tablet (1,000 mg total) by mouth 2 (two) times daily with a meal. 180 tablet 1  . nitroGLYCERIN (NITROSTAT) 0.4 MG SL tablet Place 1 tablet (0.4 mg total) under the tongue every 5 (five) minutes as needed for chest pain (CP or SOB). 60 tablet 12  . OLANZapine (ZYPREXA) 20 MG tablet Take 20 mg by mouth at bedtime.     . simvastatin (ZOCOR) 20 MG tablet Take 1 tablet (20 mg total) by mouth at bedtime. For high cholesterol control 90 tablet 1  . traZODone (DESYREL) 150 MG tablet Take 300 mg by mouth at bedtime. For sleep    . [DISCONTINUED] DULoxetine (CYMBALTA) 20 MG capsule Take 20 mg by mouth daily. Samples per Dr Arnoldo Morale    . [DISCONTINUED] gabapentin (NEURONTIN) 300 MG capsule Take 300 mg by mouth 2 (two) times daily. Per Dr Daylene Katayama     No current facility-administered medications on file prior to visit.    BP 140/82 mmHg  Pulse 68  Temp(Src) 98.2 F (36.8 C) (Oral)  Resp 18  Wt 178 lb 4.8 oz (80.876 kg)  SpO2 96%chart Objective:   Physical Exam  Constitutional: She is oriented to person, place, and time. She appears well-developed and well-nourished.  HENT:  Head: Normocephalic and atraumatic.  Right Ear: External ear normal.  Left Ear: External ear normal.  Nose: Nose normal.  Mouth/Throat: Oropharynx is clear and moist.  Eyes: Conjunctivae and EOM are normal. Pupils are equal, round, and reactive to light.  Neck: Normal range of motion. Neck supple.  Cardiovascular: Normal rate, regular rhythm, normal heart sounds and intact distal pulses.   Pulmonary/Chest: Effort  normal and breath sounds normal.  Abdominal: Soft. Bowel sounds are normal.  Musculoskeletal: Normal range of motion.  Neurological: She is alert and oriented to person, place, and time.  Sensation to sharp and dull objects present bilaterally in lower extremities.  Skin: Skin is warm and dry.  Psychiatric: She has a normal mood and affect. Her behavior is normal. Judgment and thought content normal.  Nursing note and vitals reviewed.         Assessment & Plan:  .Ty was seen today for follow-up.  Diagnoses and all orders for this visit:  Hypothyroidism, unspecified hypothyroidism type Orders: -  TSH  Diabetes type 2, uncontrolled Orders: -     Basic Metabolic Panel -     Hemoglobin A1c  Hyperlipidemia Orders: -     Basic Metabolic Panel -     Hepatic Function Panel -     Lipid Panel  Obesity Orders: -     Basic Metabolic Panel   Plan: 1. Review HbA1C. If remains elevated possible removal of glipizide and add an additional adjunct therapy 2. Follow once labs result. 3. Diabetes management information provided.

## 2014-09-22 NOTE — Patient Instructions (Signed)
Diabetes Mellitus and Food It is important for you to manage your blood sugar (glucose) level. Your blood glucose level can be greatly affected by what you eat. Eating healthier foods in the appropriate amounts throughout the day at about the same time each day will help you control your blood glucose level. It can also help slow or prevent worsening of your diabetes mellitus. Healthy eating may even help you improve the level of your blood pressure and reach or maintain a healthy weight.  HOW CAN FOOD AFFECT ME? Carbohydrates Carbohydrates affect your blood glucose level more than any other type of food. Your dietitian will help you determine how many carbohydrates to eat at each meal and teach you how to count carbohydrates. Counting carbohydrates is important to keep your blood glucose at a healthy level, especially if you are using insulin or taking certain medicines for diabetes mellitus. Alcohol Alcohol can cause sudden decreases in blood glucose (hypoglycemia), especially if you use insulin or take certain medicines for diabetes mellitus. Hypoglycemia can be a life-threatening condition. Symptoms of hypoglycemia (sleepiness, dizziness, and disorientation) are similar to symptoms of having too much alcohol.  If your health care provider has given you approval to drink alcohol, do so in moderation and use the following guidelines:  Women should not have more than one drink per day, and men should not have more than two drinks per day. One drink is equal to:  12 oz of beer.  5 oz of wine.  1 oz of hard liquor.  Do not drink on an empty stomach.  Keep yourself hydrated. Have water, diet soda, or unsweetened iced tea.  Regular soda, juice, and other mixers might contain a lot of carbohydrates and should be counted. WHAT FOODS ARE NOT RECOMMENDED? As you make food choices, it is important to remember that all foods are not the same. Some foods have fewer nutrients per serving than other  foods, even though they might have the same number of calories or carbohydrates. It is difficult to get your body what it needs when you eat foods with fewer nutrients. Examples of foods that you should avoid that are high in calories and carbohydrates but low in nutrients include:  Trans fats (most processed foods list trans fats on the Nutrition Facts label).  Regular soda.  Juice.  Candy.  Sweets, such as cake, pie, doughnuts, and cookies.  Fried foods. WHAT FOODS CAN I EAT? Have nutrient-rich foods, which will nourish your body and keep you healthy. The food you should eat also will depend on several factors, including:  The calories you need.  The medicines you take.  Your weight.  Your blood glucose level.  Your blood pressure level.  Your cholesterol level. You also should eat a variety of foods, including:  Protein, such as meat, poultry, fish, tofu, nuts, and seeds (lean animal proteins are best).  Fruits.  Vegetables.  Dairy products, such as milk, cheese, and yogurt (low fat is best).  Breads, grains, pasta, cereal, rice, and beans.  Fats such as olive oil, trans fat-free margarine, canola oil, avocado, and olives. DOES EVERYONE WITH DIABETES MELLITUS HAVE THE SAME MEAL PLAN? Because every person with diabetes mellitus is different, there is not one meal plan that works for everyone. It is very important that you meet with a dietitian who will help you create a meal plan that is just right for you. Document Released: 04/11/2005 Document Revised: 07/20/2013 Document Reviewed: 06/11/2013 [employer] Patient Information 2015 [employer]. This   information is not intended to replace advice given to you by your health care provider. Make sure you discuss any questions you have with your health care provider.  

## 2014-09-22 NOTE — Progress Notes (Signed)
Pre visit review using our clinic review tool, if applicable. No additional management support is needed unless otherwise documented below in the visit note. 

## 2014-12-01 ENCOUNTER — Encounter: Payer: Self-pay | Admitting: Adult Health

## 2014-12-01 ENCOUNTER — Ambulatory Visit (INDEPENDENT_AMBULATORY_CARE_PROVIDER_SITE_OTHER): Payer: BLUE CROSS/BLUE SHIELD | Admitting: Adult Health

## 2014-12-01 VITALS — BP 120/80 | Temp 98.2°F | Wt 182.2 lb

## 2014-12-01 DIAGNOSIS — Z7689 Persons encountering health services in other specified circumstances: Secondary | ICD-10-CM

## 2014-12-01 DIAGNOSIS — E039 Hypothyroidism, unspecified: Secondary | ICD-10-CM | POA: Diagnosis not present

## 2014-12-01 DIAGNOSIS — E119 Type 2 diabetes mellitus without complications: Secondary | ICD-10-CM | POA: Diagnosis not present

## 2014-12-01 DIAGNOSIS — Z7189 Other specified counseling: Secondary | ICD-10-CM | POA: Diagnosis not present

## 2014-12-01 LAB — TSH: TSH: 0.93 u[IU]/mL (ref 0.35–4.50)

## 2014-12-01 LAB — HEMOGLOBIN A1C: Hgb A1c MFr Bld: 7.1 % — ABNORMAL HIGH (ref 4.6–6.5)

## 2014-12-01 MED ORDER — SIMVASTATIN 20 MG PO TABS: 20.0000 mg | ORAL_TABLET | Freq: Every day | ORAL | Status: DC

## 2014-12-01 MED ORDER — METFORMIN HCL 1000 MG PO TABS: 1000.0000 mg | ORAL_TABLET | Freq: Two times a day (BID) | ORAL | Status: DC

## 2014-12-01 MED ORDER — GLIPIZIDE 10 MG PO TABS: 10.0000 mg | ORAL_TABLET | Freq: Two times a day (BID) | ORAL | Status: DC

## 2014-12-01 MED ORDER — LEVOTHYROXINE SODIUM 137 MCG PO TABS: 137.0000 ug | ORAL_TABLET | Freq: Every day | ORAL | Status: DC

## 2014-12-01 NOTE — Patient Instructions (Signed)
It was great meeting you today. Your medical conditions are well controlled, continue on the same regimen. Schedule a complete physical at the end of August. If you need anything in the mean time, please let me know. Continue to work on diet and exercise

## 2014-12-01 NOTE — Progress Notes (Signed)
HPI:  Alexandra [name] is here to establish care.  Last PCP and physical: August 2015 with NP Megan Salon Dentist: Twice a year Eye Exam: Yearly   54 year old African American female, nonsmoker with a history of bipolar disorder, hypertension, type 2 diabetes-uncontrolled, hypothyroidism is in today to establish care.   She's in the care of psychiatry for management of bipolar disorder.  She is followed by pain management for lower back/tailbone pain.  Patient has no acute problems that she wants addressed toady.   Has the following chronic problems that require follow up and concerns today:  Diabetes - She has not been checking her blood sugars at home. Last A1C was 6.2. She attempts to follow a diabetic diet.   HTN - Does not monitor this at home. She feels as though she is well controlled on current regimen.    Hypothyroidism - Well controlled on current regimen.    ROS negative for unless reported above: fevers, unintentional weight loss, hearing or vision loss, chest pain, palpitations, struggling to breath, hemoptysis, melena, hematochezia, hematuria, falls, loc, si, thoughts of self harm  Past Medical History  Diagnosis Date  . GERD (gastroesophageal reflux disease)     prn med.  . Hypothyroidism   . Hypertension     new dx. - will start med. 06/28/2011  . Sleep apnea     prior to gastric bypass - no sleep study since bypass  . Rotator cuff tear, right   . Diabetes mellitus   . Hyperlipidemia   . Hiatal hernia 1998  . Hyperplastic colon polyp   . Fatty liver   . Hyperthyroidism     Past Surgical History  Procedure Laterality Date  . Cesarean section  1991  . Cholecystectomy  1989  . Shoulder arthroscopy distal clavicle excision and open rotator cuff repair  07/26/2010    right  . Shoulder arthroscopy  01/02/2006; 05/31/2004    left 2007; right 2005  . Exploratory laparotomy  05/08/2000    right salpingo-oophorectomy  . Abdominal hysterectomy   06/25/2000    left salpingo-oophorectomy  . 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 Laminotomy / excision disk posterior cervical spine  08/10/2009    C7-T1  . Thyroid surgery  early 2000s    goiter  . Cardiac catheterization  04/01/2006  . Shoulder arthroscopy  07/02/2011    Procedure: ARTHROSCOPY SHOULDER;  Surgeon: Cammie Sickle., MD;  Location: [place];  Service: Orthopedics;  Laterality: Right;  repair supraspinatus, Debride glenohumeral joint, labrum  . Back surgery  1997    discetomy, lumbar spinal fusion  . Tubal ligation      Family History  Problem Relation Age of Onset  . Coronary artery disease      History   Social History  . Marital Status: Married    Spouse Name: N/A  . Number of Children: N/A  . Years of Education: N/A   Social History Main Topics  . Smoking status: Never Smoker   . Smokeless tobacco: Never Used  . Alcohol Use: No  . Drug Use: No  . Sexual Activity: Yes   Other Topics Concern  . None   Social History Narrative     Current outpatient prescriptions:  .  aspirin 325 MG tablet, Take 1 tablet (325 mg total) by mouth daily. For heart health, Disp: 30 tablet, Rfl: 3 .  benztropine (COGENTIN) 1 MG tablet, Take 1 mg by mouth 2 (two) times daily. For prevention of drug  related involuntary movements, Disp: , Rfl:  .  bisoprolol-hydrochlorothiazide (ZIAC) 5-6.25 MG per tablet, TAKE 1 TABLET DAILY FOR HIGH BLOOD PRESSURE CONTROL, Disp: 90 tablet, Rfl: 1 .  citalopram (CELEXA) 40 MG tablet, Take 40 mg by mouth daily. , Disp: , Rfl:  .  glucose blood (ONE TOUCH ULTRA TEST) test strip, Use as instructed, Disp: 300 each, Rfl: 4 .  HYDROcodone-acetaminophen (NORCO) 10-325 MG per tablet, Take 1 tablet by mouth every 6 (six) hours as needed. , Disp: , Rfl:  .  levothyroxine (SYNTHROID) 137 MCG tablet, Take 1 tablet (137 mcg total) by mouth daily before breakfast., Disp: 90 tablet, Rfl: 0 .  lidocaine (LIDODERM) 5 %, Place 1 patch onto  the skin daily. Remove & Discard patch within 12 hours or as directed by MD: For pain, Disp: 90 patch, Rfl: 3 .  metFORMIN (GLUCOPHAGE) 1000 MG tablet, Take 1 tablet (1,000 mg total) by mouth 2 (two) times daily with a meal., Disp: 180 tablet, Rfl: 1 .  nitroGLYCERIN (NITROSTAT) 0.4 MG SL tablet, Place 1 tablet (0.4 mg total) under the tongue every 5 (five) minutes as needed for chest pain (CP or SOB)., Disp: 60 tablet, Rfl: 12 .  OLANZapine (ZYPREXA) 20 MG tablet, Take 20 mg by mouth at bedtime. , Disp: , Rfl:  .  traZODone (DESYREL) 150 MG tablet, Take 300 mg by mouth at bedtime. For sleep, Disp: , Rfl:  .  clotrimazole-betamethasone (LOTRISONE) cream, Apply topically 2 (two) times daily. (Patient not taking: Reported on 12/01/2014), Disp: 30 g, Rfl: 0 .  glipiZIDE (GLUCOTROL) 10 MG tablet, Take 1 tablet (10 mg total) by mouth 2 (two) times daily before a meal. (Patient not taking: Reported on 12/01/2014), Disp: 180 tablet, Rfl: 0 .  simvastatin (ZOCOR) 20 MG tablet, Take 1 tablet (20 mg total) by mouth at bedtime. For high cholesterol control (Patient not taking: Reported on 12/01/2014), Disp: 90 tablet, Rfl: 1 .  traZODone (DESYREL) 100 MG tablet, Take 100 mg by mouth at bedtime., Disp: , Rfl: 1 .  [DISCONTINUED] DULoxetine (CYMBALTA) 20 MG capsule, Take 20 mg by mouth daily. Samples per Dr Arnoldo Morale, Disp: , Rfl:  .  [DISCONTINUED] gabapentin (NEURONTIN) 300 MG capsule, Take 300 mg by mouth 2 (two) times daily. Per Dr Daylene Katayama, Disp: , Rfl:   EXAM:  Filed Vitals:   12/01/14 1005  BP: 120/80  Temp: 98.2 F (36.8 C)    Body mass index is 33.32 kg/(m^2).  GENERAL: vitals reviewed and listed above, alert, oriented, appears well hydrated and in no acute distress. She is obese  HEENT: atraumatic, conjunttiva clear, no obvious abnormalities on inspection of external nose and ears  NECK: no obvious masses on inspection  LUNGS: clear to auscultation bilaterally, no wheezes, rales or rhonchi, good air  movement  CV: HRRR, no peripheral edema. No carotid bruit  MS: moves all extremities without noticeable abnormality. No edema noted  Abd: soft/nontender/nondistended/normal bowel sounds. She is obese around abdomen. Scar on stomach from prior surgery.   Skin: warm and dry, no rash   Neuro: CN II-XII intact,  5/5 muscle strength in bilateral upper and lower extremities. Normal finger to nose. Normal rapid alternating movements. Normal romberg. No pronator drift. 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 No pateller reflexes, mild brachial reflexes.    PSYCH: pleasant and cooperative, no obvious depression or anxiety  ASSESSMENT AND PLAN:  Discussed the following assessment and plan:  1. Encounter to establish care - Follow up in August for CPE -  Follow up sooner as needed  2. Hypothyroidism, unspecified hypothyroidism type - TSH - Will follow up with results.   3. Type 2 diabetes mellitus without complication - Z6S - Will follow up with results.   4. Healthy Maintenance - Continue to eat a healthy diabetic diet - Start exercising, at least 30 minutes a day 3-5 days a week.  - Discussed reduction in weight.   No diagnosis found. -We reviewed the PMH, PSH, FH, SH, Meds and Allergies. -We provided refills for any medications we will prescribe as needed. -We addressed current concerns per orders and patient instructions. -We have asked for records for pertinent exams, studies, vaccines and notes from previous providers. -We have advised patient to follow up per instructions below.   -Patient advised to return or notify a provider immediately if symptoms worsen or persist or new concerns arise.  There are no Patient Instructions on file for this visit.   [employer]

## 2014-12-01 NOTE — Progress Notes (Signed)
Pre visit review using our clinic review tool, if applicable. No additional management support is needed unless otherwise documented below in the visit note. 

## 2015-02-07 ENCOUNTER — Other Ambulatory Visit: Payer: Self-pay | Admitting: Family

## 2015-02-07 NOTE — Telephone Encounter (Signed)
Rx sent in

## 2015-02-15 ENCOUNTER — Telehealth: Payer: Self-pay | Admitting: *Deleted

## 2015-02-15 DIAGNOSIS — E118 Type 2 diabetes mellitus with unspecified complications: Secondary | ICD-10-CM

## 2015-02-15 MED ORDER — BENZTROPINE MESYLATE 1 MG PO TABS: 1.0000 mg | ORAL_TABLET | Freq: Two times a day (BID) | ORAL | Status: DC

## 2015-02-15 MED ORDER — GLUCOSE BLOOD VI STRP: ORAL_STRIP | Status: DC

## 2015-02-15 NOTE — Telephone Encounter (Signed)
Pls advise if you will be refilling this medication.

## 2015-02-15 NOTE — Telephone Encounter (Signed)
OK to refill

## 2015-02-15 NOTE — Telephone Encounter (Signed)
Patient is requesting a refill of  benztropine (COGENTIN) 1 MG tablet Refill of test strips sent [facility]

## 2015-02-15 NOTE — Telephone Encounter (Signed)
Per Tommi Rumps ok to refill but he would psychiatry to take over this medication.  Called and left a message for pt to call office to see if she has a current psychiatrist. Rx sent to pharmacy for 90 day supply.

## 2015-02-17 NOTE — Telephone Encounter (Signed)
Called and spoke with pt about medication and pt states she sees Dr. Teryl Lucy for therapy and sees Beverly Sessions for her medication.  Pt states her medication has been taken care of.

## 2015-03-01 ENCOUNTER — Telehealth: Payer: Self-pay | Admitting: Adult Health

## 2015-03-01 NOTE — Telephone Encounter (Signed)
Pt would like to go to [facility] for her labs work. If ok please put order in system

## 2015-03-02 ENCOUNTER — Other Ambulatory Visit: Payer: Self-pay | Admitting: Adult Health

## 2015-03-02 ENCOUNTER — Other Ambulatory Visit: Payer: Self-pay

## 2015-03-02 DIAGNOSIS — I10 Essential (primary) hypertension: Secondary | ICD-10-CM

## 2015-03-02 DIAGNOSIS — E1165 Type 2 diabetes mellitus with hyperglycemia: Secondary | ICD-10-CM

## 2015-03-02 DIAGNOSIS — Z0000 Encounter for general adult medical examination without abnormal findings: Secondary | ICD-10-CM

## 2015-03-02 DIAGNOSIS — IMO0002 Reserved for concepts with insufficient information to code with codable children: Secondary | ICD-10-CM

## 2015-03-02 NOTE — Telephone Encounter (Signed)
Pt is aware.  

## 2015-03-02 NOTE — Telephone Encounter (Signed)
Labs placed.

## 2015-03-06 ENCOUNTER — Other Ambulatory Visit (INDEPENDENT_AMBULATORY_CARE_PROVIDER_SITE_OTHER): Payer: BLUE CROSS/BLUE SHIELD

## 2015-03-06 ENCOUNTER — Other Ambulatory Visit: Payer: BLUE CROSS/BLUE SHIELD

## 2015-03-06 DIAGNOSIS — Z0000 Encounter for general adult medical examination without abnormal findings: Secondary | ICD-10-CM | POA: Diagnosis not present

## 2015-03-06 DIAGNOSIS — E1165 Type 2 diabetes mellitus with hyperglycemia: Secondary | ICD-10-CM

## 2015-03-06 DIAGNOSIS — IMO0002 Reserved for concepts with insufficient information to code with codable children: Secondary | ICD-10-CM

## 2015-03-06 DIAGNOSIS — I10 Essential (primary) hypertension: Secondary | ICD-10-CM

## 2015-03-06 LAB — BASIC METABOLIC PANEL
BUN: 7 mg/dL (ref 6–23)
CALCIUM: 8.9 mg/dL (ref 8.4–10.5)
CO2: 31 mEq/L (ref 19–32)
Chloride: 105 mEq/L (ref 96–112)
Creatinine, Ser: 0.65 mg/dL (ref 0.40–1.20)
GFR: 121.72 mL/min (ref >60.00)
Glucose, Bld: 119 mg/dL — ABNORMAL HIGH (ref 70–99)
POTASSIUM: 3.4 meq/L — AB (ref 3.5–5.1)
SODIUM: 146 meq/L — AB (ref 135–145)

## 2015-03-06 LAB — HEPATIC FUNCTION PANEL
ALK PHOS: 64 U/L (ref 39–117)
ALT: 22 U/L (ref 0–35)
AST: 20 U/L (ref 0–37)
Albumin: 3.6 g/dL (ref 3.5–5.2)
BILIRUBIN TOTAL: 0.4 mg/dL (ref 0.2–1.2)
Bilirubin, Direct: 0.1 mg/dL (ref 0.0–0.3)
Total Protein: 6.7 g/dL (ref 6.0–8.3)

## 2015-03-06 LAB — CBC WITH DIFFERENTIAL/PLATELET
BASOS PCT: 0.3 % (ref 0.0–3.0)
Basophils Absolute: 0 10*3/uL (ref 0.0–0.1)
Eosinophils Absolute: 0.1 10*3/uL (ref 0.0–0.7)
Eosinophils Relative: 1.1 % (ref 0.0–5.0)
HEMATOCRIT: 39.2 % (ref 36.0–46.0)
Hemoglobin: 12.9 g/dL (ref 12.0–15.0)
LYMPHS ABS: 3.2 10*3/uL (ref 0.7–4.0)
Lymphocytes Relative: 35.3 % (ref 12.0–46.0)
MCHC: 32.9 g/dL (ref 30.0–36.0)
MCV: 93.9 fl (ref 78.0–100.0)
MONOS PCT: 6.6 % (ref 3.0–12.0)
Monocytes Absolute: 0.6 10*3/uL (ref 0.1–1.0)
NEUTROS ABS: 5.1 10*3/uL (ref 1.4–7.7)
Neutrophils Relative %: 56.7 % (ref 43.0–77.0)
Platelets: 400 10*3/uL (ref 150.0–400.0)
RBC: 4.17 Mil/uL (ref 3.87–5.11)
RDW: 14.2 % (ref 11.5–15.5)
WBC: 8.9 10*3/uL (ref 4.0–10.5)

## 2015-03-06 LAB — TSH: TSH: 0.25 u[IU]/mL — ABNORMAL LOW (ref 0.35–4.50)

## 2015-03-06 LAB — LIPID PANEL
CHOL/HDL RATIO: 3
Cholesterol: 105 mg/dL (ref 0–200)
HDL: 30.4 mg/dL — ABNORMAL LOW (ref >39.00)
LDL CALC: 60 mg/dL (ref 0–99)
NonHDL: 74.45
Triglycerides: 72 mg/dL (ref 0.0–149.0)
VLDL: 14.4 mg/dL (ref 0.0–40.0)

## 2015-03-06 LAB — HEMOGLOBIN A1C: HEMOGLOBIN A1C: 6.1 % (ref 4.6–6.5)

## 2015-03-07 ENCOUNTER — Encounter: Payer: Self-pay | Admitting: Adult Health

## 2015-03-07 ENCOUNTER — Other Ambulatory Visit: Payer: Self-pay | Admitting: Adult Health

## 2015-03-07 MED ORDER — LEVOTHYROXINE SODIUM 125 MCG PO TABS: 125.0000 ug | ORAL_TABLET | Freq: Every day | ORAL | Status: DC

## 2015-03-10 ENCOUNTER — Other Ambulatory Visit: Payer: Self-pay

## 2015-03-10 DIAGNOSIS — Z1231 Encounter for screening mammogram for malignant neoplasm of breast: Secondary | ICD-10-CM

## 2015-03-13 ENCOUNTER — Encounter: Payer: BLUE CROSS/BLUE SHIELD | Admitting: Adult Health

## 2015-03-13 DIAGNOSIS — Z0289 Encounter for other administrative examinations: Secondary | ICD-10-CM

## 2015-03-16 ENCOUNTER — Encounter: Payer: Self-pay | Admitting: Adult Health

## 2015-03-16 ENCOUNTER — Ambulatory Visit (INDEPENDENT_AMBULATORY_CARE_PROVIDER_SITE_OTHER): Payer: BLUE CROSS/BLUE SHIELD | Admitting: Adult Health

## 2015-03-16 VITALS — BP 130/88 | Temp 98.5°F | Ht 62.0 in | Wt 188.1 lb

## 2015-03-16 DIAGNOSIS — Z23 Encounter for immunization: Secondary | ICD-10-CM | POA: Diagnosis not present

## 2015-03-16 DIAGNOSIS — E039 Hypothyroidism, unspecified: Secondary | ICD-10-CM | POA: Diagnosis not present

## 2015-03-16 DIAGNOSIS — E119 Type 2 diabetes mellitus without complications: Secondary | ICD-10-CM

## 2015-03-16 DIAGNOSIS — Z0000 Encounter for general adult medical examination without abnormal findings: Secondary | ICD-10-CM | POA: Diagnosis not present

## 2015-03-16 MED ORDER — LEVOTHYROXINE SODIUM 125 MCG PO TABS: 125.0000 ug | ORAL_TABLET | Freq: Every day | ORAL | Status: DC

## 2015-03-16 NOTE — Progress Notes (Signed)
Subjective:    Patient ID: Alexandra [name], female    DOB: September 30, 1960, 54 y.o.   MRN: [id number]  HPI  54 year old AA female who presents to the office today for her annual physical exam. She has a  has a past medical history of GERD (gastroesophageal reflux disease); Hypothyroidism; Hypertension; Sleep apnea; Rotator cuff tear, right; Diabetes mellitus; Hyperlipidemia; Hiatal hernia (1998); Hyperplastic colon polyp; Fatty liver; and Hyperthyroidism.   She has had a eye exam and mammogram this year. Her last colonoscopy 2012,she is on the 10 year plan.   She is taking her Metformin and Glipizide as directed and is checking her blood sugars twice a day. She is eating a diabetic diet.   She has no complaints at this time.    Review of Systems  Constitutional: Negative.   HENT: Negative.   Eyes: Negative.   Respiratory: Negative.   Cardiovascular: Negative.   Gastrointestinal: Negative.   Endocrine: Negative.   Genitourinary: Negative.   Musculoskeletal: Negative.   Skin: Negative.   Allergic/Immunologic: Negative.   Neurological: Negative.        Objective:   Physical Exam  Constitutional: She is oriented to person, place, and time. She appears well-developed and well-nourished. No distress.  HENT:  Head: Normocephalic and atraumatic.  Right Ear: External ear normal.  Left Ear: External ear normal.  Nose: Nose normal.  Mouth/Throat: No oropharyngeal exudate.  Obese neck  Eyes: Conjunctivae are normal. Pupils are equal, round, and reactive to light. Right eye exhibits no discharge. Left eye exhibits no discharge. No scleral icterus.  Neck: Normal range of motion. Neck supple. No JVD present. No tracheal deviation present. No thyromegaly present.  Cardiovascular: Normal rate, regular rhythm, normal heart sounds and intact distal pulses.  Exam reveals no gallop.   No murmur heard. No carotid bruit  Pulmonary/Chest: Effort normal and breath sounds normal. No respiratory  distress. She has no wheezes. She has no rales. She exhibits no tenderness.  Abdominal: Soft. Bowel sounds are normal. She exhibits no distension and no mass. There is no tenderness. There is no rebound and no guarding.  obese  Genitourinary:  Breast Exam: No lumps or masses felt. No dimpling or discharge noted.   Musculoskeletal: Normal range of motion. She exhibits no edema or tenderness.  Neurological: She is alert and oriented to person, place, and time. She has normal reflexes. No cranial nerve deficit. Coordination normal.  Skin: Skin is warm and dry. No rash noted. She is not diaphoretic. No erythema.  Psychiatric: She has a normal mood and affect. Her behavior is normal. Thought content normal.  Nursing note and vitals reviewed.  Past Medical History  Diagnosis Date  . GERD (gastroesophageal reflux disease)     prn med.  . Hypothyroidism   . Hypertension     new dx. - will start med. 06/28/2011  . Sleep apnea     prior to gastric bypass - no sleep study since bypass  . Rotator cuff tear, right   . Diabetes mellitus   . Hyperlipidemia   . Hiatal hernia 1998  . Hyperplastic colon polyp   . Fatty liver   . Hyperthyroidism     Social History   Social History  . Marital Status: Married    Spouse Name: N/A  . Number of Children: N/A  . Years of Education: N/A   Occupational History  . Not on file.   Social History Main Topics  . Smoking status: Never Smoker   .  Smokeless tobacco: Never Used  . Alcohol Use: No  . Drug Use: No  . Sexual Activity: Yes   Other Topics Concern  . Not on file   Social History Narrative   Married for 24 years    Six children- all live around here. Two daughters live with her   Six grand children    On Disability for mental issues   No pets   Likes to go to movies and out to dinner. She likes to do puzzle books. Likes to read      She eats a healthy diet   She does not exercise              Past Surgical History  Procedure  Laterality Date  . Cesarean section  1991  . Cholecystectomy  1989  . Shoulder arthroscopy distal clavicle excision and open rotator cuff repair  07/26/2010    right  . Shoulder arthroscopy  01/02/2006; 05/31/2004    left 2007; right 2005  . Exploratory laparotomy  05/08/2000    right salpingo-oophorectomy  . Abdominal hysterectomy  06/25/2000    left salpingo-oophorectomy  . 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 Laminotomy / excision disk posterior cervical spine  08/10/2009    C7-T1  . Thyroid surgery  early 2000s    goiter  . Cardiac catheterization  04/01/2006  . Shoulder arthroscopy  07/02/2011    Procedure: ARTHROSCOPY SHOULDER;  Surgeon: Cammie Sickle., MD;  Location: [place];  Service: Orthopedics;  Laterality: Right;  repair supraspinatus, Debride glenohumeral joint, labrum  . Back surgery  1997    discetomy, lumbar spinal fusion  . Tubal ligation      Family History  Problem Relation Age of Onset  . Coronary artery disease    . Cancer Paternal Grandfather     Prostate Cancer  . Diabetes Mother   . Hyperlipidemia Mother   . Hypertension Mother   . Thyroid disease Mother   . Rheum arthritis Mother   . COPD Mother     Allergies  Allergen Reactions  . Shrimp Flavor Swelling    Swelling of tongue and rash  . Ace Inhibitors Cough  . Oxycodone-Acetaminophen Rash    Current Outpatient Prescriptions on File Prior to Visit  Medication Sig Dispense Refill  . aspirin 325 MG tablet Take 1 tablet (325 mg total) by mouth daily. For heart health 30 tablet 3  . benztropine (COGENTIN) 1 MG tablet Take 1 tablet (1 mg total) by mouth 2 (two) times daily. For prevention of drug related involuntary movements 180 tablet 0  . bisoprolol-hydrochlorothiazide (ZIAC) 5-6.25 MG per tablet TAKE 1 TABLET DAILY FOR HIGH BLOOD PRESSURE CONTROL 90 tablet 0  . citalopram (CELEXA) 40 MG tablet Take 40 mg by mouth daily.     . clotrimazole-betamethasone (LOTRISONE) cream  Apply topically 2 (two) times daily. 30 g 0  . glipiZIDE (GLUCOTROL) 10 MG tablet Take 1 tablet (10 mg total) by mouth 2 (two) times daily before a meal. 180 tablet 1  . glucose blood (ONE TOUCH ULTRA TEST) test strip Test once daily dxE11.9 100 each 3  . HYDROcodone-acetaminophen (NORCO) 10-325 MG per tablet Take 1 tablet by mouth every 6 (six) hours as needed.     . lidocaine (LIDODERM) 5 % Place 1 patch onto the skin daily. Remove & Discard patch within 12 hours or as directed by MD: For pain 90 patch 3  . metFORMIN (GLUCOPHAGE) 1000 MG tablet Take 1 tablet (  1,000 mg total) by mouth 2 (two) times daily with a meal. 180 tablet 1  . nitroGLYCERIN (NITROSTAT) 0.4 MG SL tablet Place 1 tablet (0.4 mg total) under the tongue every 5 (five) minutes as needed for chest pain (CP or SOB). 60 tablet 12  . OLANZapine (ZYPREXA) 20 MG tablet Take 20 mg by mouth at bedtime.     . simvastatin (ZOCOR) 20 MG tablet Take 1 tablet (20 mg total) by mouth at bedtime. For high cholesterol control 90 tablet 1  . traZODone (DESYREL) 100 MG tablet Take 100 mg by mouth at bedtime.  1  . traZODone (DESYREL) 150 MG tablet Take 300 mg by mouth at bedtime. For sleep    . [DISCONTINUED] DULoxetine (CYMBALTA) 20 MG capsule Take 20 mg by mouth daily. Samples per Dr Arnoldo Morale    . [DISCONTINUED] gabapentin (NEURONTIN) 300 MG capsule Take 300 mg by mouth 2 (two) times daily. Per Dr Daylene Katayama     No current facility-administered medications on file prior to visit.    BP 130/88 mmHg  Temp(Src) 98.5 F (36.9 C) (Oral)  Ht 5\' 2"  (1.575 m)  Wt 188 lb 1.6 oz (85.322 kg)  BMI 34.40 kg/m2       Assessment & Plan:  1. Routine general medical examination at a health care facility - Follow up in one year for CPE - Follow up sooner if needed - Continue following a diabetic diet and exercise - Reviewed labs with patient.  2. Type 2 diabetes mellitus without complication - J8S  At this visit is 6.1, she has dropped one point in three  months.  - Continue with current medication - Follow up in six months for recheck.   3. Hypothyroidism, unspecified hypothyroidism type - Recently decreased her Synthroid to 14mcg. Will get repeat TSH in 4-6 weeks.  - TSH; Future  4. Need for prophylactic vaccination with combined diphtheria-tetanus-pertussis (DTP) vaccine  - Tdap vaccine greater than or equal to 7yo IM  5. Need for prophylactic vaccination against Streptococcus pneumoniae (pneumococcus)  - Pneumococcal conjugate vaccine 13-valent

## 2015-03-16 NOTE — Patient Instructions (Signed)
It was great seeing you today!  You are doing great with controlling your diabetes, keep it up!   Please follow up with me in 6 months for a follow up for diabetes   Follow up in one month to check your thyroid levels.

## 2015-04-17 ENCOUNTER — Other Ambulatory Visit: Payer: Self-pay | Admitting: Adult Health

## 2015-04-17 ENCOUNTER — Encounter: Payer: Self-pay | Admitting: Adult Health

## 2015-04-17 ENCOUNTER — Ambulatory Visit
Admission: RE | Admit: 2015-04-17 | Discharge: 2015-04-17 | Disposition: A | Discharge status: Home / Self Care | Payer: BLUE CROSS/BLUE SHIELD | Source: Ambulatory Visit

## 2015-04-17 ENCOUNTER — Other Ambulatory Visit (INDEPENDENT_AMBULATORY_CARE_PROVIDER_SITE_OTHER): Payer: BLUE CROSS/BLUE SHIELD

## 2015-04-17 DIAGNOSIS — Z23 Encounter for immunization: Secondary | ICD-10-CM | POA: Diagnosis not present

## 2015-04-17 DIAGNOSIS — E039 Hypothyroidism, unspecified: Secondary | ICD-10-CM

## 2015-04-17 DIAGNOSIS — Z1231 Encounter for screening mammogram for malignant neoplasm of breast: Secondary | ICD-10-CM

## 2015-04-17 LAB — TSH: TSH: 5.51 u[IU]/mL — ABNORMAL HIGH (ref 0.35–4.50)

## 2015-04-17 MED ORDER — LEVOTHYROXINE SODIUM 137 MCG PO TABS: 137.0000 ug | ORAL_TABLET | Freq: Every day | ORAL | Status: DC

## 2015-05-07 ENCOUNTER — Other Ambulatory Visit: Payer: Self-pay | Admitting: Adult Health

## 2015-05-08 ENCOUNTER — Other Ambulatory Visit: Payer: Self-pay | Admitting: Adult Health

## 2015-05-08 NOTE — Telephone Encounter (Signed)
Ok to send in Cogentin

## 2015-09-18 ENCOUNTER — Ambulatory Visit: Payer: Self-pay | Admitting: Adult Health

## 2015-09-19 ENCOUNTER — Other Ambulatory Visit: Payer: Self-pay | Admitting: Adult Health

## 2015-09-19 ENCOUNTER — Ambulatory Visit (INDEPENDENT_AMBULATORY_CARE_PROVIDER_SITE_OTHER): Payer: BLUE CROSS/BLUE SHIELD | Admitting: Adult Health

## 2015-09-19 ENCOUNTER — Encounter: Payer: Self-pay | Admitting: Adult Health

## 2015-09-19 VITALS — BP 130/80 | HR 86 | Temp 97.6°F | Wt 176.3 lb

## 2015-09-19 DIAGNOSIS — I10 Essential (primary) hypertension: Secondary | ICD-10-CM | POA: Diagnosis not present

## 2015-09-19 DIAGNOSIS — E119 Type 2 diabetes mellitus without complications: Secondary | ICD-10-CM

## 2015-09-19 DIAGNOSIS — E039 Hypothyroidism, unspecified: Secondary | ICD-10-CM | POA: Diagnosis not present

## 2015-09-19 LAB — TSH: TSH: 7.3 u[IU]/mL — AB (ref 0.35–4.50)

## 2015-09-19 LAB — HEMOGLOBIN A1C: HEMOGLOBIN A1C: 5.7 % (ref 4.6–6.5)

## 2015-09-19 MED ORDER — LEVOTHYROXINE SODIUM 137 MCG PO TABS
137.0000 ug | ORAL_TABLET | Freq: Every day | ORAL | Status: DC
Start: 2015-09-19 — End: 2015-12-13

## 2015-09-19 MED ORDER — LEVOTHYROXINE SODIUM 137 MCG PO TABS: 137.0000 ug | ORAL_TABLET | Freq: Every day | ORAL | Status: DC

## 2015-09-19 NOTE — Patient Instructions (Addendum)
It was great seeing you again!  I will follow up with you about your blood work and we can change medications if needed  Continue to work on diet and exercise  Follow up in August for your physical.

## 2015-09-19 NOTE — Progress Notes (Signed)
Subjective:    Patient ID: Alexandra [name], female    DOB: 1960/10/13, 55 y.o.   MRN: [id number]  HPI  55 year old female who presents to the office today due to diabetes, hypothyroidism, and hypertension. Over all, she reports that she is doing well. Has been exercising and watching her diet. Has lost about 8 pounds since August. She reports that her blood sugars have been in the 90's -100's.   Denies any signs of hypothryroidism  She denies any chest pain or shortness of breath   Review of Systems  Constitutional: Negative.   HENT: Negative.   Eyes: Negative.   Respiratory: Negative.   Cardiovascular: Negative.   Gastrointestinal: Positive for constipation (from narcotics).  Genitourinary: Negative.   Musculoskeletal: Negative.   Skin: Negative.   Allergic/Immunologic: Negative.   Neurological: Positive for tremors (chronic).  Hematological: Negative.   Psychiatric/Behavioral: Negative.   All other systems reviewed and are negative.  Past Medical History  Diagnosis Date  . GERD (gastroesophageal reflux disease)     prn med.  . Hypothyroidism   . Hypertension     new dx. - will start med. 06/28/2011  . Sleep apnea     prior to gastric bypass - no sleep study since bypass  . Rotator cuff tear, right   . Diabetes mellitus ([place])   . Hyperlipidemia   . Hiatal hernia 1998  . Hyperplastic colon polyp   . Fatty liver   . Hyperthyroidism     Social History   Social History  . Marital Status: Married    Spouse Name: N/A  . Number of Children: N/A  . Years of Education: N/A   Occupational History  . Not on file.   Social History Main Topics  . Smoking status: Never Smoker   . Smokeless tobacco: Never Used  . Alcohol Use: No  . Drug Use: No  . Sexual Activity: Yes   Other Topics Concern  . Not on file   Social History Narrative   Married for 24 years    Six children- all live around here. Two daughters live with her   Six grand children    On  Disability for mental issues   No pets   Likes to go to movies and out to dinner. She likes to do puzzle books. Likes to read      She eats a healthy diet   She does not exercise              Past Surgical History  Procedure Laterality Date  . Cesarean section  1991  . Cholecystectomy  1989  . Shoulder arthroscopy distal clavicle excision and open rotator cuff repair  07/26/2010    right  . Shoulder arthroscopy  01/02/2006; 05/31/2004    left 2007; right 2005  . Exploratory laparotomy  05/08/2000    right salpingo-oophorectomy  . Abdominal hysterectomy  06/25/2000    left salpingo-oophorectomy  . 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 Laminotomy / excision disk posterior cervical spine  08/10/2009    C7-T1  . Thyroid surgery  early 2000s    goiter  . Cardiac catheterization  04/01/2006  . Shoulder arthroscopy  07/02/2011    Procedure: ARTHROSCOPY SHOULDER;  Surgeon: Cammie Sickle., MD;  Location: [facility];  Service: Orthopedics;  Laterality: Right;  repair supraspinatus, Debride glenohumeral joint, labrum  . Back surgery  1997    discetomy, lumbar spinal fusion  . Tubal ligation  Family History  Problem Relation Age of Onset  . Coronary artery disease    . Cancer Paternal Grandfather     Prostate Cancer  . Diabetes Mother   . Hyperlipidemia Mother   . Hypertension Mother   . Thyroid disease Mother   . Rheum arthritis Mother   . COPD Mother     Allergies  Allergen Reactions  . Shrimp Flavor Swelling    Swelling of tongue and rash  . Ace Inhibitors Cough  . Oxycodone-Acetaminophen Rash    Current Outpatient Prescriptions on File Prior to Visit  Medication Sig Dispense Refill  . aspirin 325 MG tablet Take 1 tablet (325 mg total) by mouth daily. For heart health 30 tablet 3  . bisoprolol-hydrochlorothiazide (ZIAC) 5-6.25 MG tablet TAKE 1 TABLET DAILY FOR HIGH BLOOD PRESSURE CONTROL 90 tablet 3  . glipiZIDE (GLUCOTROL) 10 MG tablet TAKE 1  TABLET TWICE A DAY BEFORE A MEAL 180 tablet 2  . glucose blood (ONE TOUCH ULTRA TEST) test strip Test once daily dxE11.9 100 each 3  . levothyroxine (SYNTHROID) 137 MCG tablet Take 1 tablet (137 mcg total) by mouth daily before breakfast. 30 tablet 3  . lidocaine (LIDODERM) 5 % Place 1 patch onto the skin daily. Remove & Discard patch within 12 hours or as directed by MD: For pain 90 patch 3  . metFORMIN (GLUCOPHAGE) 1000 MG tablet TAKE 1 TABLET TWICE A DAY WITH A MEAL. 180 tablet 2  . nitroGLYCERIN (NITROSTAT) 0.4 MG SL tablet Place 1 tablet (0.4 mg total) under the tongue every 5 (five) minutes as needed for chest pain (CP or SOB). 60 tablet 12  . OLANZapine (ZYPREXA) 20 MG tablet Take 20 mg by mouth at bedtime.     Marland Kitchen oxyCODONE-acetaminophen (PERCOCET) 7.5-325 MG per tablet Take 1 tablet by mouth 4 (four) times daily.  0  . simvastatin (ZOCOR) 20 MG tablet TAKE 1 TABLET AT BEDTIME FOR HIGH CHOLESTEROL CONTROL 90 tablet 2  . traZODone (DESYREL) 150 MG tablet Take 300 mg by mouth at bedtime. For sleep    . benztropine (COGENTIN) 1 MG tablet TAKE 1 TABLET TWICE A DAY FOR PREVENTION OF DRUG RELATED INVOLUNTARY MOVEMENTS (Patient not taking: Reported on 09/19/2015) 180 tablet 2  . citalopram (CELEXA) 40 MG tablet Take 40 mg by mouth daily. Reported on 09/19/2015    . clotrimazole-betamethasone (LOTRISONE) cream Apply topically 2 (two) times daily. (Patient not taking: Reported on 09/19/2015) 30 g 0  . HYDROcodone-acetaminophen (NORCO) 10-325 MG per tablet Take 1 tablet by mouth every 6 (six) hours as needed. Reported on 09/19/2015    . traZODone (DESYREL) 100 MG tablet Take 100 mg by mouth at bedtime. Reported on 09/19/2015  1  . [DISCONTINUED] DULoxetine (CYMBALTA) 20 MG capsule Take 20 mg by mouth daily. Samples per Dr Arnoldo Morale    . [DISCONTINUED] gabapentin (NEURONTIN) 300 MG capsule Take 300 mg by mouth 2 (two) times daily. Per Dr Daylene Katayama     No current facility-administered medications on file prior to  visit.    BP 130/80 mmHg  Pulse 86  Temp(Src) 97.6 F (36.4 C) (Oral)  Wt 176 lb 4.8 oz (79.969 kg)       Objective:   Physical Exam  Constitutional: She is oriented to person, place, and time. She appears well-developed and well-nourished. No distress.  Eyes: Conjunctivae and EOM are normal. Pupils are equal, round, and reactive to light. Right eye exhibits no discharge. Left eye exhibits no discharge.  Neck: Normal range  of motion. Neck supple.  Cardiovascular: Normal rate, regular rhythm, normal heart sounds and intact distal pulses.  Exam reveals no gallop and no friction rub.   No murmur heard. Pulmonary/Chest: Effort normal and breath sounds normal. No respiratory distress. She has no wheezes. She has no rales. She exhibits no tenderness.  Musculoskeletal: Normal range of motion. She exhibits no edema or tenderness.  Lymphadenopathy:    She has no cervical adenopathy.  Neurological: She is alert and oriented to person, place, and time.  Skin: Skin is warm and dry. No rash noted. She is not diaphoretic. No erythema. No pallor.  Psychiatric: She has a normal mood and affect. Her behavior is normal. Thought content normal.  Nursing note and vitals reviewed.      Assessment & Plan:  1. Type 2 diabetes mellitus without complication, without long-term current use of insulin (HCC) - Hemoglobin A1c - BMP with eGFR - Depending on what A1c is, consider decreasing Glipizide  2. Hypothyroidism, unspecified hypothyroidism type - TSH - BMP with eGFR - Consider changing dose of Synthroid 3. Essential hypertension - Controlled - Hemoglobin A1c - BMP with eGFR - TSH

## 2015-09-19 NOTE — Addendum Note (Signed)
Addended by: Colleen Can on: 09/19/2015 04:31 PM   Modules accepted: Orders

## 2015-09-20 LAB — BASIC METABOLIC PANEL WITH GFR
BUN: 9 mg/dL (ref 7–25)
CHLORIDE: 104 mmol/L (ref 98–110)
CO2: 28 mmol/L (ref 20–31)
Calcium: 8.6 mg/dL (ref 8.6–10.4)
Creat: 0.6 mg/dL (ref 0.50–1.05)
GFR, Est African American: >89 mL/min (ref >=60)
GLUCOSE: 95 mg/dL (ref 65–99)
Potassium: 3.8 mmol/L (ref 3.5–5.3)
Sodium: 143 mmol/L (ref 135–146)

## 2015-12-12 ENCOUNTER — Encounter: Payer: Self-pay | Admitting: Adult Health

## 2015-12-12 ENCOUNTER — Ambulatory Visit: Payer: BLUE CROSS/BLUE SHIELD | Admitting: Adult Health

## 2015-12-12 ENCOUNTER — Ambulatory Visit (INDEPENDENT_AMBULATORY_CARE_PROVIDER_SITE_OTHER): Payer: BLUE CROSS/BLUE SHIELD | Admitting: Adult Health

## 2015-12-12 VITALS — BP 106/60 | Temp 97.9°F | Wt 177.5 lb

## 2015-12-12 DIAGNOSIS — I10 Essential (primary) hypertension: Secondary | ICD-10-CM

## 2015-12-12 DIAGNOSIS — E119 Type 2 diabetes mellitus without complications: Secondary | ICD-10-CM

## 2015-12-12 DIAGNOSIS — E039 Hypothyroidism, unspecified: Secondary | ICD-10-CM

## 2015-12-12 DIAGNOSIS — R32 Unspecified urinary incontinence: Secondary | ICD-10-CM

## 2015-12-12 LAB — POC URINALSYSI DIPSTICK (AUTOMATED)
BILIRUBIN UA: NEGATIVE
GLUCOSE UA: NEGATIVE
Ketones, UA: NEGATIVE
Leukocytes, UA: NEGATIVE
Nitrite, UA: NEGATIVE
PH UA: 6.5
Protein, UA: NEGATIVE
RBC UA: NEGATIVE
SPEC GRAV UA: 1.015
Urobilinogen, UA: 1

## 2015-12-12 NOTE — Progress Notes (Signed)
Subjective:    Patient ID: Alexandra [name], female    DOB: 1960/09/16, 55 y.o.   MRN: [id number]  HPI  55 year old female who presents to the office today for three month follow up due to diabetes, hypertension, and hypothyroidism. I last saw her in February at which time her A1c was 5.7 and her TSH was 7.3. Her blood pressure has been well controlled.   She has been working on diet and exercise. She has not lost any weight since her last visit.   She also reports that over the last month she has been " wetting the bed at least twice a week." She has had no medication changes and she has not changed her diet. She denies drinking fluids late into the night. She has not had any UTI type symptoms.     Review of Systems  Constitutional: Negative.   Respiratory: Negative.   Cardiovascular: Negative.   Gastrointestinal: Negative.   Genitourinary: Positive for enuresis. Negative for dysuria, urgency, frequency, flank pain and difficulty urinating.  Neurological: Negative.   All other systems reviewed and are negative.  Past Medical History  Diagnosis Date  . GERD (gastroesophageal reflux disease)     prn med.  . Hypothyroidism   . Hypertension     new dx. - will start med. 06/28/2011  . Sleep apnea     prior to gastric bypass - no sleep study since bypass  . Rotator cuff tear, right   . Diabetes mellitus ([place])   . Hyperlipidemia   . Hiatal hernia 1998  . Hyperplastic colon polyp   . Fatty liver   . Hyperthyroidism     Social History   Social History  . Marital Status: Married    Spouse Name: N/A  . Number of Children: N/A  . Years of Education: N/A   Occupational History  . Not on file.   Social History Main Topics  . Smoking status: Never Smoker   . Smokeless tobacco: Never Used  . Alcohol Use: No  . Drug Use: No  . Sexual Activity: Yes   Other Topics Concern  . Not on file   Social History Narrative   Married for 24 years    Six children- all live around  here. Two daughters live with her   Six grand children    On Disability for mental issues   No pets   Likes to go to movies and out to dinner. She likes to do puzzle books. Likes to read      She eats a healthy diet   She does not exercise              Past Surgical History  Procedure Laterality Date  . Cesarean section  1991  . Cholecystectomy  1989  . Shoulder arthroscopy distal clavicle excision and open rotator cuff repair  07/26/2010    right  . Shoulder arthroscopy  01/02/2006; 05/31/2004    left 2007; right 2005  . Exploratory laparotomy  05/08/2000    right salpingo-oophorectomy  . Abdominal hysterectomy  06/25/2000    left salpingo-oophorectomy  . 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 Laminotomy / excision disk posterior cervical spine  08/10/2009    C7-T1  . Thyroid surgery  early 2000s    goiter  . Cardiac catheterization  04/01/2006  . Shoulder arthroscopy  07/02/2011    Procedure: ARTHROSCOPY SHOULDER;  Surgeon: Cammie Sickle., MD;  Location: [facility];  Service: Orthopedics;  Laterality: Right;  repair supraspinatus, Debride glenohumeral joint, labrum  . Back surgery  1997    discetomy, lumbar spinal fusion  . Tubal ligation      Family History  Problem Relation Age of Onset  . Coronary artery disease    . Cancer Paternal Grandfather     Prostate Cancer  . Diabetes Mother   . Hyperlipidemia Mother   . Hypertension Mother   . Thyroid disease Mother   . Rheum arthritis Mother   . COPD Mother     Allergies  Allergen Reactions  . Shrimp Flavor Swelling    Swelling of tongue and rash  . Ace Inhibitors Cough  . Oxycodone-Acetaminophen Rash    Current Outpatient Prescriptions on File Prior to Visit  Medication Sig Dispense Refill  . aspirin 325 MG tablet Take 1 tablet (325 mg total) by mouth daily. For heart health 30 tablet 3  . benztropine (COGENTIN) 1 MG tablet TAKE 1 TABLET TWICE A DAY FOR PREVENTION OF DRUG RELATED INVOLUNTARY  MOVEMENTS 180 tablet 2  . bisoprolol-hydrochlorothiazide (ZIAC) 5-6.25 MG tablet TAKE 1 TABLET DAILY FOR HIGH BLOOD PRESSURE CONTROL 90 tablet 3  . citalopram (CELEXA) 40 MG tablet Take 40 mg by mouth daily. Reported on 09/19/2015    . clotrimazole-betamethasone (LOTRISONE) cream Apply topically 2 (two) times daily. 30 g 0  . glucose blood (ONE TOUCH ULTRA TEST) test strip Test once daily dxE11.9 100 each 3  . levothyroxine (SYNTHROID) 137 MCG tablet Take 1 tablet (137 mcg total) by mouth daily before breakfast. 30 tablet 3  . lidocaine (LIDODERM) 5 % Place 1 patch onto the skin daily. Remove & Discard patch within 12 hours or as directed by MD: For pain 90 patch 3  . metFORMIN (GLUCOPHAGE) 1000 MG tablet TAKE 1 TABLET TWICE A DAY WITH A MEAL. 180 tablet 2  . nitroGLYCERIN (NITROSTAT) 0.4 MG SL tablet Place 1 tablet (0.4 mg total) under the tongue every 5 (five) minutes as needed for chest pain (CP or SOB). 60 tablet 12  . OLANZapine (ZYPREXA) 20 MG tablet Take 20 mg by mouth at bedtime.     Marland Kitchen oxyCODONE-acetaminophen (PERCOCET) 7.5-325 MG per tablet Take 1 tablet by mouth 4 (four) times daily.  0  . simvastatin (ZOCOR) 20 MG tablet TAKE 1 TABLET AT BEDTIME FOR HIGH CHOLESTEROL CONTROL 90 tablet 2  . traZODone (DESYREL) 100 MG tablet Take 100 mg by mouth at bedtime. Reported on 09/19/2015  1  . glipiZIDE (GLUCOTROL) 10 MG tablet TAKE 1 TABLET TWICE A DAY BEFORE A MEAL (Patient not taking: Reported on 12/12/2015) 180 tablet 2  . [DISCONTINUED] DULoxetine (CYMBALTA) 20 MG capsule Take 20 mg by mouth daily. Samples per Dr Arnoldo Morale    . [DISCONTINUED] gabapentin (NEURONTIN) 300 MG capsule Take 300 mg by mouth 2 (two) times daily. Per Dr Daylene Katayama     No current facility-administered medications on file prior to visit.    BP 106/60 mmHg  Temp(Src) 97.9 F (36.6 C) (Oral)  Wt 177 lb 8 oz (80.513 kg)       Objective:   Physical Exam  Constitutional: She is oriented to person, place, and time. She  appears well-developed and well-nourished. No distress.  Cardiovascular: Normal rate, regular rhythm, normal heart sounds and intact distal pulses.  Exam reveals no gallop and no friction rub.   No murmur heard. Pulmonary/Chest: Effort normal and breath sounds normal. No respiratory distress. She has no wheezes. She has no rales. She exhibits no tenderness.  Abdominal: Soft. Bowel sounds are normal. She exhibits mass. She exhibits no distension. There is tenderness in the periumbilical area, left upper quadrant and left lower quadrant. There is CVA tenderness (left ). There is no rigidity, no rebound and no guarding.  Neurological: She is alert and oriented to person, place, and time.  Skin: Skin is warm and dry. No rash noted. She is not diaphoretic. No erythema. No pallor.  Psychiatric: She has a normal mood and affect. Her behavior is normal. Judgment and thought content normal.  Nursing note and vitals reviewed.     Assessment & Plan:  1. Essential hypertension - well controlled. Going to have her stop Ziac and monitor blood pressure at home - TSH - Basic metabolic panel - Follow up in 2 weeks  2. Type 2 diabetes mellitus without complication, without long-term current use of insulin (HCC) - Well controlled - Basic metabolic panel - Hemoglobin A1c  3. Hypothyroidism, unspecified hypothyroidism type - TSH - Consider changing synthroid dose 4. Enuresis - Possibly UTI. Cannot r/o kidney stones. Less likely bladder cancer. May be due to sleep apnea? - POCT Urinalysis Dipstick (Automated) - Consider [place] of abdomen and/or urology consult.  - Follow up in 2 weeks or sooner if symptoms worsen  Dorothyann Peng, NP

## 2015-12-12 NOTE — Patient Instructions (Signed)
It was great seeing you again.   I will follow up with you regarding the labs.   Stop taking Ziac  Follow up with me in two weeks

## 2015-12-13 ENCOUNTER — Telehealth: Payer: Self-pay | Admitting: Adult Health

## 2015-12-13 LAB — BASIC METABOLIC PANEL
BUN: 10 mg/dL (ref 6–23)
CALCIUM: 9.3 mg/dL (ref 8.4–10.5)
CHLORIDE: 104 meq/L (ref 96–112)
CO2: 29 meq/L (ref 19–32)
CREATININE: 0.78 mg/dL (ref 0.40–1.20)
GFR: 98.35 mL/min (ref >60.00)
GLUCOSE: 95 mg/dL (ref 70–99)
Potassium: 3.9 mEq/L (ref 3.5–5.1)
SODIUM: 142 meq/L (ref 135–145)

## 2015-12-13 LAB — HEMOGLOBIN A1C: Hgb A1c MFr Bld: 6.9 % — ABNORMAL HIGH (ref 4.6–6.5)

## 2015-12-13 LAB — TSH: TSH: 0.14 u[IU]/mL — ABNORMAL LOW (ref 0.35–4.50)

## 2015-12-13 MED ORDER — LEVOTHYROXINE SODIUM 125 MCG PO TABS: 125.0000 ug | ORAL_TABLET | Freq: Every day | ORAL | Status: DC

## 2015-12-13 MED ORDER — LEVOTHYROXINE SODIUM 137 MCG PO TABS: 137.0000 ug | ORAL_TABLET | Freq: Every day | ORAL | Status: DC

## 2015-12-13 NOTE — Telephone Encounter (Signed)
Spoke to Alexandra [name] on the phone and informed her of her labs. Her A1c has increased about a 1 point. Her thyroid level is a little low. Her urine is clean   I am wondering if the recent spike in blood sugars is causing her incontinence. She is going to work on diet and exercise for the next month and see if the incontinence subsides. If not will send to urology.   I am also going to have her alternate days of synthroid with 125 and 137 mcg. Will retest in 4-6 weeks.

## 2015-12-26 ENCOUNTER — Ambulatory Visit (INDEPENDENT_AMBULATORY_CARE_PROVIDER_SITE_OTHER): Payer: BLUE CROSS/BLUE SHIELD | Admitting: Adult Health

## 2015-12-26 ENCOUNTER — Encounter: Payer: Self-pay | Admitting: Adult Health

## 2015-12-26 VITALS — BP 164/84 | Temp 98.2°F | Wt 173.0 lb

## 2015-12-26 DIAGNOSIS — N3944 Nocturnal enuresis: Secondary | ICD-10-CM

## 2015-12-26 DIAGNOSIS — I10 Essential (primary) hypertension: Secondary | ICD-10-CM

## 2015-12-26 DIAGNOSIS — E039 Hypothyroidism, unspecified: Secondary | ICD-10-CM

## 2015-12-26 NOTE — Progress Notes (Signed)
Subjective:    Patient ID: Alexandra [name], female    DOB: 1961-07-25, 55 y.o.   MRN: [id number]  HPI   55 year old female who presents to the office today for follow up regarding blood pressure and nocturnal enuresis. I last saw her two weeks ago and at that time we took her off her Ziac to see if she could stop taking it. Today in the blood pressure her blood pressure is 164/84. She denies any headaches or blurred vision.   She was also having an issue with nocturnal enuresis, this had been happening for one month prior to seeing me. At that point I was thinking that she may have been having raising blood sugars which were causing it. This was not the case. She also reports that since she had Roux-En-Y, that her sleep apnea has resolved.   She continues to have nocturnal enuresis about two times a week. She is drinking a bottle of water before bed. Denies any UTI type symptoms.   She has lost 4 pounds since the last visit.   Wt Readings from Last 3 Encounters:  12/26/15 173 lb (78.472 kg)  12/12/15 177 lb 8 oz (80.513 kg)  09/19/15 176 lb 4.8 oz (79.969 kg)     Review of Systems  Constitutional: Negative.   Respiratory: Negative.   Cardiovascular: Negative.   Gastrointestinal: Negative.   Genitourinary:       Bed wetting  Musculoskeletal: Negative.   Neurological: Negative.   Psychiatric/Behavioral: Negative.   All other systems reviewed and are negative.  Past Medical History  Diagnosis Date  . GERD (gastroesophageal reflux disease)     prn med.  . Hypothyroidism   . Hypertension     new dx. - will start med. 06/28/2011  . Sleep apnea     prior to gastric bypass - no sleep study since bypass  . Rotator cuff tear, right   . Diabetes mellitus ([place])   . Hyperlipidemia   . Hiatal hernia 1998  . Hyperplastic colon polyp   . Fatty liver   . Hyperthyroidism     Social History   Social History  . Marital Status: Married    Spouse Name: N/A  . Number of Children:  N/A  . Years of Education: N/A   Occupational History  . Not on file.   Social History Main Topics  . Smoking status: Never Smoker   . Smokeless tobacco: Never Used  . Alcohol Use: No  . Drug Use: No  . Sexual Activity: Yes   Other Topics Concern  . Not on file   Social History Narrative   Married for 24 years    Six children- all live around here. Two daughters live with her   Six grand children    On Disability for mental issues   No pets   Likes to go to movies and out to dinner. She likes to do puzzle books. Likes to read      She eats a healthy diet   She does not exercise              Past Surgical History  Procedure Laterality Date  . Cesarean section  1991  . Cholecystectomy  1989  . Shoulder arthroscopy distal clavicle excision and open rotator cuff repair  07/26/2010    right  . Shoulder arthroscopy  01/02/2006; 05/31/2004    left 2007; right 2005  . Exploratory laparotomy  05/08/2000    right salpingo-oophorectomy  .  Abdominal hysterectomy  06/25/2000    left salpingo-oophorectomy  . 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 Laminotomy / excision disk posterior cervical spine  08/10/2009    C7-T1  . Thyroid surgery  early 2000s    goiter  . Cardiac catheterization  04/01/2006  . Shoulder arthroscopy  07/02/2011    Procedure: ARTHROSCOPY SHOULDER;  Surgeon: Cammie Sickle., MD;  Location: [facility];  Service: Orthopedics;  Laterality: Right;  repair supraspinatus, Debride glenohumeral joint, labrum  . Back surgery  1997    discetomy, lumbar spinal fusion  . Tubal ligation      Family History  Problem Relation Age of Onset  . Coronary artery disease    . Cancer Paternal Grandfather     Prostate Cancer  . Diabetes Mother   . Hyperlipidemia Mother   . Hypertension Mother   . Thyroid disease Mother   . Rheum arthritis Mother   . COPD Mother     Allergies  Allergen Reactions  . Shrimp Flavor Swelling    Swelling of tongue and  rash  . Ace Inhibitors Cough  . Oxycodone-Acetaminophen Rash    Current Outpatient Prescriptions on File Prior to Visit  Medication Sig Dispense Refill  . aspirin 325 MG tablet Take 1 tablet (325 mg total) by mouth daily. For heart health 30 tablet 3  . benztropine (COGENTIN) 1 MG tablet TAKE 1 TABLET TWICE A DAY FOR PREVENTION OF DRUG RELATED INVOLUNTARY MOVEMENTS 180 tablet 2  . bisoprolol-hydrochlorothiazide (ZIAC) 5-6.25 MG tablet TAKE 1 TABLET DAILY FOR HIGH BLOOD PRESSURE CONTROL 90 tablet 3  . citalopram (CELEXA) 40 MG tablet Take 40 mg by mouth daily. Reported on 09/19/2015    . clotrimazole-betamethasone (LOTRISONE) cream Apply topically 2 (two) times daily. 30 g 0  . glucose blood (ONE TOUCH ULTRA TEST) test strip Test once daily dxE11.9 100 each 3  . levothyroxine (SYNTHROID) 137 MCG tablet Take 1 tablet (137 mcg total) by mouth daily before breakfast. 30 tablet 3  . levothyroxine (SYNTHROID, LEVOTHROID) 125 MCG tablet Take 1 tablet (125 mcg total) by mouth daily. 30 tablet 1  . lidocaine (LIDODERM) 5 % Place 1 patch onto the skin daily. Remove & Discard patch within 12 hours or as directed by MD: For pain 90 patch 3  . metFORMIN (GLUCOPHAGE) 1000 MG tablet TAKE 1 TABLET TWICE A DAY WITH A MEAL. 180 tablet 2  . nitroGLYCERIN (NITROSTAT) 0.4 MG SL tablet Place 1 tablet (0.4 mg total) under the tongue every 5 (five) minutes as needed for chest pain (CP or SOB). 60 tablet 12  . OLANZapine (ZYPREXA) 20 MG tablet Take 20 mg by mouth at bedtime.     Marland Kitchen oxyCODONE-acetaminophen (PERCOCET) 7.5-325 MG per tablet Take 1 tablet by mouth 4 (four) times daily.  0  . simvastatin (ZOCOR) 20 MG tablet TAKE 1 TABLET AT BEDTIME FOR HIGH CHOLESTEROL CONTROL 90 tablet 2  . traZODone (DESYREL) 100 MG tablet Take 100 mg by mouth at bedtime. Reported on 09/19/2015  1  . glipiZIDE (GLUCOTROL) 10 MG tablet TAKE 1 TABLET TWICE A DAY BEFORE A MEAL (Patient not taking: Reported on 12/12/2015) 180 tablet 2  .  [DISCONTINUED] DULoxetine (CYMBALTA) 20 MG capsule Take 20 mg by mouth daily. Samples per Dr Arnoldo Morale    . [DISCONTINUED] gabapentin (NEURONTIN) 300 MG capsule Take 300 mg by mouth 2 (two) times daily. Per Dr Daylene Katayama     No current facility-administered medications on file prior to visit.  BP 164/84 mmHg  Temp(Src) 98.2 F (36.8 C) (Oral)  Wt 173 lb (78.472 kg)       Objective:   Physical Exam  Constitutional: She is oriented to person, place, and time. She appears well-developed and well-nourished. No distress.  Cardiovascular: Normal rate, regular rhythm, normal heart sounds and intact distal pulses.  Exam reveals no gallop and no friction rub.   No murmur heard. Pulmonary/Chest: Effort normal and breath sounds normal. No respiratory distress. She has no wheezes. She has no rales. She exhibits no tenderness.  Abdominal: Soft. Bowel sounds are normal. She exhibits no distension and no mass. There is no tenderness. There is no rebound and no guarding.  Neurological: She is alert and oriented to person, place, and time.  Skin: Skin is warm and dry. No rash noted. She is not diaphoretic. No erythema. No pallor.  Psychiatric: She has a normal mood and affect. Her behavior is normal. Judgment and thought content normal.  Nursing note and vitals reviewed.      Assessment & Plan:  1. Nocturnal enuresis - Unknown cause of her nocturnal enuresis. Advised to not drink water before bed. Will refer to urology for further evaluation  - Ambulatory referral to Urology  2. Essential hypertension - Restart Ziac - Monitor BP at home and follow up with me if BP above [id number] systolic  3. Hypothyroidism, unspecified hypothyroidism type - TSH; Future - T4, Free; Future - T3, Free; Future  Dorothyann Peng, NP

## 2016-03-11 ENCOUNTER — Telehealth: Payer: Self-pay | Admitting: Adult Health

## 2016-03-11 NOTE — Telephone Encounter (Signed)
° °  Pt would like to have labs drawn at [facility] location

## 2016-03-12 ENCOUNTER — Other Ambulatory Visit: Payer: Self-pay | Admitting: Adult Health

## 2016-03-12 DIAGNOSIS — Z0000 Encounter for general adult medical examination without abnormal findings: Secondary | ICD-10-CM

## 2016-03-12 NOTE — Telephone Encounter (Signed)
Orders placed.

## 2016-03-13 ENCOUNTER — Other Ambulatory Visit: Payer: Self-pay

## 2016-03-13 ENCOUNTER — Other Ambulatory Visit (INDEPENDENT_AMBULATORY_CARE_PROVIDER_SITE_OTHER): Payer: BLUE CROSS/BLUE SHIELD

## 2016-03-13 DIAGNOSIS — Z0000 Encounter for general adult medical examination without abnormal findings: Secondary | ICD-10-CM

## 2016-03-13 LAB — CBC WITH DIFFERENTIAL/PLATELET
BASOS ABS: 0 10*3/uL (ref 0.0–0.1)
BASOS PCT: 0.3 % (ref 0.0–3.0)
EOS ABS: 0.4 10*3/uL (ref 0.0–0.7)
Eosinophils Relative: 3.3 % (ref 0.0–5.0)
HEMATOCRIT: 37.2 % (ref 36.0–46.0)
Hemoglobin: 12.5 g/dL (ref 12.0–15.0)
LYMPHS ABS: 3.8 10*3/uL (ref 0.7–4.0)
LYMPHS PCT: 33.3 % (ref 12.0–46.0)
MCHC: 33.5 g/dL (ref 30.0–36.0)
MCV: 90.9 fl (ref 78.0–100.0)
MONO ABS: 0.8 10*3/uL (ref 0.1–1.0)
Monocytes Relative: 7.2 % (ref 3.0–12.0)
NEUTROS ABS: 6.4 10*3/uL (ref 1.4–7.7)
NEUTROS PCT: 55.9 % (ref 43.0–77.0)
PLATELETS: 395 10*3/uL (ref 150.0–400.0)
RBC: 4.09 Mil/uL (ref 3.87–5.11)
RDW: 15.5 % (ref 11.5–15.5)
WBC: 11.4 10*3/uL — ABNORMAL HIGH (ref 4.0–10.5)

## 2016-03-13 LAB — LIPID PANEL
CHOL/HDL RATIO: 4
Cholesterol: 159 mg/dL (ref 0–200)
HDL: 35.5 mg/dL — AB (ref >39.00)
LDL CALC: 99 mg/dL (ref 0–99)
NONHDL: 123.01
Triglycerides: 119 mg/dL (ref 0.0–149.0)
VLDL: 23.8 mg/dL (ref 0.0–40.0)

## 2016-03-13 LAB — HEPATIC FUNCTION PANEL
ALK PHOS: 74 U/L (ref 39–117)
ALT: 23 U/L (ref 0–35)
AST: 19 U/L (ref 0–37)
Albumin: 4 g/dL (ref 3.5–5.2)
BILIRUBIN DIRECT: 0.2 mg/dL (ref 0.0–0.3)
BILIRUBIN TOTAL: 0.5 mg/dL (ref 0.2–1.2)
TOTAL PROTEIN: 7 g/dL (ref 6.0–8.3)

## 2016-03-13 LAB — HEMOGLOBIN A1C: Hgb A1c MFr Bld: 7 % — ABNORMAL HIGH (ref 4.6–6.5)

## 2016-03-13 LAB — MICROALBUMIN / CREATININE URINE RATIO
CREATININE, U: 40.3 mg/dL
Microalb Creat Ratio: 1.7 mg/g (ref 0.0–30.0)
Microalb, Ur: <0.7 mg/dL (ref 0.0–1.9)

## 2016-03-13 LAB — BASIC METABOLIC PANEL
BUN: 6 mg/dL (ref 6–23)
CALCIUM: 9.3 mg/dL (ref 8.4–10.5)
CHLORIDE: 102 meq/L (ref 96–112)
CO2: 33 meq/L — AB (ref 19–32)
CREATININE: 0.72 mg/dL (ref 0.40–1.20)
GFR: 107.77 mL/min (ref >60.00)
GLUCOSE: 136 mg/dL — AB (ref 70–99)
Potassium: 4 mEq/L (ref 3.5–5.1)
Sodium: 144 mEq/L (ref 135–145)

## 2016-03-18 ENCOUNTER — Encounter: Payer: Self-pay | Admitting: Adult Health

## 2016-03-19 ENCOUNTER — Ambulatory Visit (INDEPENDENT_AMBULATORY_CARE_PROVIDER_SITE_OTHER): Payer: BLUE CROSS/BLUE SHIELD | Admitting: Adult Health

## 2016-03-19 ENCOUNTER — Encounter: Payer: Self-pay | Admitting: Adult Health

## 2016-03-19 VITALS — BP 110/80 | Temp 98.0°F | Ht 61.0 in | Wt 185.0 lb

## 2016-03-19 DIAGNOSIS — E118 Type 2 diabetes mellitus with unspecified complications: Secondary | ICD-10-CM

## 2016-03-19 DIAGNOSIS — Z1159 Encounter for screening for other viral diseases: Secondary | ICD-10-CM | POA: Diagnosis not present

## 2016-03-19 DIAGNOSIS — Z87891 Personal history of nicotine dependence: Secondary | ICD-10-CM | POA: Diagnosis not present

## 2016-03-19 DIAGNOSIS — I10 Essential (primary) hypertension: Secondary | ICD-10-CM

## 2016-03-19 DIAGNOSIS — Z0000 Encounter for general adult medical examination without abnormal findings: Secondary | ICD-10-CM | POA: Diagnosis not present

## 2016-03-19 DIAGNOSIS — E785 Hyperlipidemia, unspecified: Secondary | ICD-10-CM

## 2016-03-19 DIAGNOSIS — Z760 Encounter for issue of repeat prescription: Secondary | ICD-10-CM

## 2016-03-19 MED ORDER — METFORMIN HCL 1000 MG PO TABS
ORAL_TABLET | ORAL | 3 refills | Status: DC
Start: 2016-03-19 — End: 2016-05-14

## 2016-03-19 MED ORDER — LIDOCAINE 5 % EX PTCH: 1.0000 | MEDICATED_PATCH | CUTANEOUS | 3 refills | Status: DC

## 2016-03-19 MED ORDER — GLUCOSE BLOOD VI STRP: ORAL_STRIP | 3 refills | Status: DC

## 2016-03-19 MED ORDER — BENZTROPINE MESYLATE 1 MG PO TABS: ORAL_TABLET | ORAL | 3 refills | Status: DC

## 2016-03-19 MED ORDER — BISOPROLOL-HYDROCHLOROTHIAZIDE 5-6.25 MG PO TABS: ORAL_TABLET | ORAL | 3 refills | Status: DC

## 2016-03-19 MED ORDER — CITALOPRAM HYDROBROMIDE 40 MG PO TABS: 40.0000 mg | ORAL_TABLET | Freq: Every day | ORAL | 1 refills | Status: DC

## 2016-03-19 MED ORDER — TRAZODONE HCL 100 MG PO TABS: 100.0000 mg | ORAL_TABLET | Freq: Every day | ORAL | 1 refills | Status: DC

## 2016-03-19 MED ORDER — OLANZAPINE 20 MG PO TABS: 20.0000 mg | ORAL_TABLET | Freq: Every day | ORAL | 1 refills | Status: DC

## 2016-03-19 MED ORDER — SIMVASTATIN 20 MG PO TABS: ORAL_TABLET | ORAL | 3 refills | Status: DC

## 2016-03-19 NOTE — Progress Notes (Signed)
Subjective:    Patient ID: Alexandra [name], female    DOB: [birth year], 55 y.o.   MRN: [id number]  HPI  Patient presents for yearly preventative medicine examination. She is a pleasant 55 year old female who  has a past medical history of Diabetes mellitus ([place]); Fatty liver; GERD (gastroesophageal reflux disease); Hiatal hernia (1998); Hyperlipidemia; Hyperplastic colon polyp; Hypertension; Hyperthyroidism; Hypothyroidism; Rotator cuff tear, right; and Sleep apnea.  All immunizations and health maintenance protocols were reviewed with the patient and needed orders were placed.  Medication reconciliation,  past medical history, social history, problem list and allergies were reviewed in detail with the patient  Goals were established with regard to weight loss, exercise, and  diet in compliance with medications. She reports that her diet is poor but she is walking 2-3 times per week at night.   She is not checking her blood sugars at home because she had run out of strips.   She has an appointment for her eye exam and mammogram. She does self breast exams monthly and has not noticed any changes.   She feels as though her depression and hypertension are well controlled.     Review of Systems  Constitutional: Negative.   HENT: Negative.   Eyes: Negative.   Respiratory: Negative.   Cardiovascular: Negative.   Genitourinary: Negative.   Musculoskeletal: Negative.   Skin: Negative.   Neurological: Negative.   Hematological: Negative.   Psychiatric/Behavioral: Negative.   All other systems reviewed and are negative.  Past Medical History:  Diagnosis Date  . Diabetes mellitus ([place])   . Fatty liver   . GERD (gastroesophageal reflux disease)    prn med.  . Hiatal hernia 1998  . Hyperlipidemia   . Hyperplastic colon polyp   . Hypertension    new dx. - will start med. 06/28/2011  . Hyperthyroidism   . Hypothyroidism   . Rotator cuff tear, right   . Sleep apnea    prior to  gastric bypass - no sleep study since bypass    Social History   Social History  . Marital status: Married    Spouse name: N/A  . Number of children: N/A  . Years of education: N/A   Occupational History  . Not on file.   Social History Main Topics  . Smoking status: Never Smoker  . Smokeless tobacco: Never Used  . Alcohol use No  . Drug use: No  . Sexual activity: Yes   Other Topics Concern  . Not on file   Social History Narrative   Married for 24 years    Six children- all live around here. Two daughters live with her   Six grand children    On Disability for mental issues   No pets   Likes to go to movies and out to dinner. She likes to do puzzle books. Likes to read      She eats a healthy diet   She does not exercise              Past Surgical History:  Procedure Laterality Date  . ABDOMINAL HYSTERECTOMY  06/25/2000   left salpingo-oophorectomy  . BACK SURGERY  1997   discetomy, lumbar spinal fusion  . CARDIAC CATHETERIZATION  04/01/2006  . CESAREAN SECTION  1991  . CHOLECYSTECTOMY  1989  . EXPLORATORY LAPAROTOMY  05/08/2000   right salpingo-oophorectomy  . LAMINOTOMY / EXCISION DISK POSTERIOR CERVICAL SPINE  08/10/2009   C7-T1  . 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 SHOULDER ARTHROSCOPY  01/02/2006; 05/31/2004   left 2007; right 2005  . SHOULDER ARTHROSCOPY  07/02/2011   Procedure: ARTHROSCOPY SHOULDER;  Surgeon: Cammie Sickle., MD;  Location: [facility];  Service: Orthopedics;  Laterality: Right;  repair supraspinatus, Debride glenohumeral joint, labrum  . SHOULDER ARTHROSCOPY DISTAL CLAVICLE EXCISION AND OPEN ROTATOR CUFF REPAIR  07/26/2010   right  . THYROID SURGERY  early 2000s   goiter  . TUBAL LIGATION      Family History  Problem Relation Age of Onset  . Coronary artery disease    . Cancer Paternal Grandfather     Prostate Cancer  . Diabetes Mother   . Hyperlipidemia Mother   . Hypertension Mother   . Thyroid  disease Mother   . Rheum arthritis Mother   . COPD Mother     Allergies  Allergen Reactions  . Shrimp Flavor Swelling    Swelling of tongue and rash  . Ace Inhibitors Cough  . Oxycodone-Acetaminophen Rash    Current Outpatient Prescriptions on File Prior to Visit  Medication Sig Dispense Refill  . aspirin 325 MG tablet Take 1 tablet (325 mg total) by mouth daily. For heart health 30 tablet 3  . benztropine (COGENTIN) 1 MG tablet TAKE 1 TABLET TWICE A DAY FOR PREVENTION OF DRUG RELATED INVOLUNTARY MOVEMENTS 180 tablet 2  . bisoprolol-hydrochlorothiazide (ZIAC) 5-6.25 MG tablet TAKE 1 TABLET DAILY FOR HIGH BLOOD PRESSURE CONTROL 90 tablet 3  . citalopram (CELEXA) 40 MG tablet Take 40 mg by mouth daily. Reported on 09/19/2015    . glucose blood (ONE TOUCH ULTRA TEST) test strip Test once daily dxE11.9 100 each 3  . levothyroxine (SYNTHROID) 137 MCG tablet Take 1 tablet (137 mcg total) by mouth daily before breakfast. 30 tablet 3  . levothyroxine (SYNTHROID, LEVOTHROID) 125 MCG tablet Take 1 tablet (125 mcg total) by mouth daily. 30 tablet 1  . lidocaine (LIDODERM) 5 % Place 1 patch onto the skin daily. Remove & Discard patch within 12 hours or as directed by MD: For pain 90 patch 3  . lubiprostone (AMITIZA) 24 MCG capsule Take 24 mcg by mouth 2 (two) times daily with a meal.    . metFORMIN (GLUCOPHAGE) 1000 MG tablet TAKE 1 TABLET TWICE A DAY WITH A MEAL. 180 tablet 2  . nitroGLYCERIN (NITROSTAT) 0.4 MG SL tablet Place 1 tablet (0.4 mg total) under the tongue every 5 (five) minutes as needed for chest pain (CP or SOB). 60 tablet 12  . OLANZapine (ZYPREXA) 20 MG tablet Take 20 mg by mouth at bedtime.     Marland Kitchen oxyCODONE-acetaminophen (PERCOCET) 7.5-325 MG per tablet Take 1 tablet by mouth 4 (four) times daily.  0  . pregabalin (LYRICA) 200 MG capsule Take 200 mg by mouth 2 (two) times daily.    . simvastatin (ZOCOR) 20 MG tablet TAKE 1 TABLET AT BEDTIME FOR HIGH CHOLESTEROL CONTROL 90 tablet 2    . traZODone (DESYREL) 100 MG tablet Take 100 mg by mouth at bedtime. Reported on 09/19/2015  1  . glipiZIDE (GLUCOTROL) 10 MG tablet TAKE 1 TABLET TWICE A DAY BEFORE A MEAL (Patient not taking: Reported on 12/12/2015) 180 tablet 2  . [DISCONTINUED] DULoxetine (CYMBALTA) 20 MG capsule Take 20 mg by mouth daily. Samples per Dr Arnoldo Morale    . [DISCONTINUED] gabapentin (NEURONTIN) 300 MG capsule Take 300 mg by mouth 2 (two) times daily. Per Dr Daylene Katayama     No current facility-administered medications on  file prior to visit.     BP 110/80 (BP Location: Left Arm, Patient Position: Sitting, Cuff Size: Normal)   Temp 98 F (36.7 C) (Oral)   Ht 5\' 1"  (1.549 m)   Wt 185 lb (83.9 kg)   BMI 34.96 kg/m       Objective:   Physical Exam  Constitutional: She is oriented to person, place, and time. She appears well-developed and well-nourished. No distress.  obese  HENT:  Head: Normocephalic and atraumatic.  Right Ear: External ear normal.  Left Ear: External ear normal.  Nose: Nose normal.  Mouth/Throat: Oropharynx is clear and moist. No oropharyngeal exudate.  Eyes: Conjunctivae and EOM are normal. Pupils are equal, round, and reactive to light. Right eye exhibits no discharge. Left eye exhibits no discharge. No scleral icterus.  Neck: Normal range of motion. Neck supple. No JVD present. Carotid bruit is not present. No tracheal deviation present. No thyromegaly present.  Cardiovascular: Normal rate, regular rhythm, normal heart sounds and intact distal pulses.  Exam reveals no gallop and no friction rub.   No murmur heard. Pulmonary/Chest: Effort normal and breath sounds normal. No stridor. No respiratory distress. She has no wheezes. She has no rales. She exhibits no tenderness.  Abdominal: Soft. Bowel sounds are normal. She exhibits no distension and no mass. There is no tenderness. There is no rebound and no guarding.  Genitourinary:  Genitourinary Comments: Breast Exam: No masses, lumps,  dimpling or discharge   Musculoskeletal: Normal range of motion. She exhibits no edema, tenderness or deformity.  Lymphadenopathy:    She has no cervical adenopathy.  Neurological: She is alert and oriented to person, place, and time. She has normal reflexes. She displays normal reflexes. No cranial nerve deficit. She exhibits normal muscle tone. Coordination normal.  Skin: Skin is warm and dry. No rash noted. No erythema. No pallor.  Psychiatric: She has a normal mood and affect. Her behavior is normal. Judgment and thought content normal.  Nursing note and vitals reviewed.     Assessment & Plan:  1. Routine general medical examination at a health care facility - Reviewed labs that she had done prior to exam in detail and questions answered - Hep C Antibody - TSH- this was not drawn during her physical labs  - CT CHEST LUNG CA SCREEN LOW DOSE W/O CM; Future   2. Former smoker - Her father was recently diagnosed with lung cancer. He quit smoking 30 years ago  - CT CHEST LUNG CA SCREEN LOW DOSE W/O CM; Future  3. Need for hepatitis C screening test - Hep C Antibody  4. Type 2 diabetes mellitus with complication, without long-term current use of insulin (HCC) - Diabetic diet and exercise  - Consider adding glipizide back to regimen  - metFORMIN (GLUCOPHAGE) 1000 MG tablet; TAKE 1 TABLET TWICE A DAY WITH A MEAL.  Dispense: 180 tablet; Refill: 3 - glucose blood (ONE TOUCH ULTRA TEST) test strip; Test once daily dxE11.9  Dispense: 100 each; Refill: 3 - Follow up in three months   5. Medication refill - bisoprolol-hydrochlorothiazide (ZIAC) 5-6.25 MG tablet; TAKE 1 TABLET DAILY FOR HIGH BLOOD PRESSURE CONTROL  Dispense: 90 tablet; Refill: 3 - benztropine (COGENTIN) 1 MG tablet; TAKE 1 TABLET TWICE A DAY FOR PREVENTION OF DRUG RELATED INVOLUNTARY MOVEMENTS  Dispense: 180 tablet; Refill: 3 - metFORMIN (GLUCOPHAGE) 1000 MG tablet; TAKE 1 TABLET TWICE A DAY WITH A MEAL.  Dispense: 180  tablet; Refill: 3 - glucose blood (ONE TOUCH  ULTRA TEST) test strip; Test once daily dxE11.9  Dispense: 100 each; Refill: 3 - OLANZapine (ZYPREXA) 20 MG tablet; Take 1 tablet (20 mg total) by mouth at bedtime.  Dispense: 90 tablet; Refill: 1 - simvastatin (ZOCOR) 20 MG tablet; TAKE 1 TABLET AT BEDTIME FOR HIGH CHOLESTEROL CONTROL  Dispense: 90 tablet; Refill: 3 - lidocaine (LIDODERM) 5 %; Place 1 patch onto the skin daily. Remove & Discard patch within 12 hours or as directed by MD: For pain  Dispense: 90 patch; Refill: 3 - citalopram (CELEXA) 40 MG tablet; Take 1 tablet (40 mg total) by mouth daily. Reported on 09/19/2015  Dispense: 90 tablet; Refill: 1 - traZODone (DESYREL) 100 MG tablet; Take 1 tablet (100 mg total) by mouth at bedtime. Reported on 09/19/2015  Dispense: 90 tablet; Refill: 1  6. Essential hypertension - well controlled  - bisoprolol-hydrochlorothiazide (ZIAC) 5-6.25 MG tablet; TAKE 1 TABLET DAILY FOR HIGH BLOOD PRESSURE CONTROL  Dispense: 90 tablet; Refill: 3  7. Hyperlipidemia - Well controlled - simvastatin (ZOCOR) 20 MG tablet; TAKE 1 TABLET AT BEDTIME FOR HIGH CHOLESTEROL CONTROL  Dispense: 90 tablet; Refill: 3  Dorothyann Peng, NP

## 2016-03-19 NOTE — Patient Instructions (Addendum)
It was great seeing you today!  Your A1c is back on it's way up. Please work on diet and exercise. We will follow up in three months to check it again  Someone will call you about the CT of the chest.   I will follow up with you regarding your blood work and will send in your medications   Health Maintenance, Female Adopting a healthy lifestyle and getting preventive care can go a long way to promote health and wellness. Talk with your health care provider about what schedule of regular examinations is right for you. This is a good chance for you to check in with your provider about disease prevention and staying healthy. In between checkups, there are plenty of things you can do on your own. Experts have done a lot of research about which lifestyle changes and preventive measures are most likely to keep you healthy. Ask your health care provider for more information. WEIGHT AND DIET  Eat a healthy diet  Be sure to include plenty of vegetables, fruits, low-fat dairy products, and lean protein.  Do not eat a lot of foods high in solid fats, added sugars, or salt.  Get regular exercise. This is one of the most important things you can do for your health.  Most adults should exercise for at least 150 minutes each week. The exercise should increase your heart rate and make you sweat (moderate-intensity exercise).  Most adults should also do strengthening exercises at least twice a week. This is in addition to the moderate-intensity exercise.  Maintain a healthy weight  Body mass index (BMI) is a measurement that can be used to identify possible weight problems. It estimates body fat based on height and weight. Your health care provider can help determine your BMI and help you achieve or maintain a healthy weight.  For females 56 years of age and older:   A BMI below 18.5 is considered underweight.  A BMI of 18.5 to 24.9 is normal.  A BMI of 25 to 29.9 is considered overweight.  A  BMI of 30 and above is considered obese.  Watch levels of cholesterol and blood lipids  You should start having your blood tested for lipids and cholesterol at 56 years of age, then have this test every 5 years.  You may need to have your cholesterol levels checked more often if:  Your lipid or cholesterol levels are high.  You are older than 56 years of age.  You are at high risk for heart disease.  CANCER SCREENING   Lung Cancer  Lung cancer screening is recommended for adults 2-64 years old who are at high risk for lung cancer because of a history of smoking.  A yearly low-dose CT scan of the lungs is recommended for people who:  Currently smoke.  Have quit within the past 15 years.  Have at least a 30-pack-year history of smoking. A pack year is smoking an average of one pack of cigarettes a day for 1 year.  Yearly screening should continue until it has been 15 years since you quit.  Yearly screening should stop if you develop a health problem that would prevent you from having lung cancer treatment.  Breast Cancer  Practice breast self-awareness. This means understanding how your breasts normally appear and feel.  It also means doing regular breast self-exams. Let your health care provider know about any changes, no matter how small.  If you are in your 56s or 30s, you should have  a clinical breast exam (CBE) by a health care provider every 1-3 years as part of a regular health exam.  If you are 56 or older, have a CBE every year. Also consider having a breast X-ray (mammogram) every year.  If you have a family history of breast cancer, talk to your health care provider about genetic screening.  If you are at high risk for breast cancer, talk to your health care provider about having an MRI and a mammogram every year.  Breast cancer gene (BRCA) assessment is recommended for women who have family members with BRCA-related cancers. BRCA-related cancers  include:  Breast.  Ovarian.  Tubal.  Peritoneal cancers.  Results of the assessment will determine the need for genetic counseling and BRCA1 and BRCA2 testing. Cervical Cancer Your health care provider may recommend that you be screened regularly for cancer of the pelvic organs (ovaries, uterus, and vagina). This screening involves a pelvic examination, including checking for microscopic changes to the surface of your cervix (Pap test). You may be encouraged to have this screening done every 3 years, beginning at age 27.  For women ages 56-65, health care providers may recommend pelvic exams and Pap testing every 3 years, or they may recommend the Pap and pelvic exam, combined with testing for human papilloma virus (HPV), every 5 years. Some types of HPV increase your risk of cervical cancer. Testing for HPV may also be done on women of any age with unclear Pap test results.  Other health care providers may not recommend any screening for nonpregnant women who are considered low risk for pelvic cancer and who do not have symptoms. Ask your health care provider if a screening pelvic exam is right for you.  If you have had past treatment for cervical cancer or a condition that could lead to cancer, you need Pap tests and screening for cancer for at least 20 years after your treatment. If Pap tests have been discontinued, your risk factors (such as having a new sexual partner) need to be reassessed to determine if screening should resume. Some women have medical problems that increase the chance of getting cervical cancer. In these cases, your health care provider may recommend more frequent screening and Pap tests. Colorectal Cancer  This type of cancer can be detected and often prevented.  Routine colorectal cancer screening usually begins at 56 years of age and continues through 56 years of age.  Your health care provider may recommend screening at an earlier age if you have risk factors for  colon cancer.  Your health care provider may also recommend using home test kits to check for hidden blood in the stool.  A small camera at the end of a tube can be used to examine your colon directly (sigmoidoscopy or colonoscopy). This is done to check for the earliest forms of colorectal cancer.  Routine screening usually begins at age 39.  Direct examination of the colon should be repeated every 5-10 years through 56 years of age. However, you may need to be screened more often if early forms of precancerous polyps or small growths are found. Skin Cancer  Check your skin from head to toe regularly.  Tell your health care provider about any new moles or changes in moles, especially if there is a change in a mole's shape or color.  Also tell your health care provider if you have a mole that is larger than the size of a pencil eraser.  Always use sunscreen. Apply sunscreen  liberally and repeatedly throughout the day.  Protect yourself by wearing long sleeves, pants, a wide-brimmed hat, and sunglasses whenever you are outside. HEART DISEASE, DIABETES, AND HIGH BLOOD PRESSURE   High blood pressure causes heart disease and increases the risk of stroke. High blood pressure is more likely to develop in:  People who have blood pressure in the high end of the normal range (130-139/85-89 mm Hg).  People who are overweight or obese.  People who are African American.  If you are 40-36 years of age, have your blood pressure checked every 3-5 years. If you are 99 years of age or older, have your blood pressure checked every year. You should have your blood pressure measured twice--once when you are at a hospital or clinic, and once when you are not at a hospital or clinic. Record the average of the two measurements. To check your blood pressure when you are not at a hospital or clinic, you can use:  An automated blood pressure machine at a pharmacy.  A home blood pressure monitor.  If you  are between 28 years and 66 years old, ask your health care provider if you should take aspirin to prevent strokes.  Have regular diabetes screenings. This involves taking a blood sample to check your fasting blood sugar level.  If you are at a normal weight and have a low risk for diabetes, have this test once every three years after 56 years of age.  If you are overweight and have a high risk for diabetes, consider being tested at a younger age or more often. PREVENTING INFECTION  Hepatitis B  If you have a higher risk for hepatitis B, you should be screened for this virus. You are considered at high risk for hepatitis B if:  You were born in a country where hepatitis B is common. Ask your health care provider which countries are considered high risk.  Your parents were born in a high-risk country, and you have not been immunized against hepatitis B (hepatitis B vaccine).  You have HIV or AIDS.  You use needles to inject street drugs.  You live with someone who has hepatitis B.  You have had sex with someone who has hepatitis B.  You get hemodialysis treatment.  You take certain medicines for conditions, including cancer, organ transplantation, and autoimmune conditions. Hepatitis C  Blood testing is recommended for:  Everyone born from [birth year] through 1966.  Anyone with known risk factors for hepatitis C. Sexually transmitted infections (STIs)  You should be screened for sexually transmitted infections (STIs) including gonorrhea and chlamydia if:  You are sexually active and are younger than 56 years of age.  You are older than 56 years of age and your health care provider tells you that you are at risk for this type of infection.  Your sexual activity has changed since you were last screened and you are at an increased risk for chlamydia or gonorrhea. Ask your health care provider if you are at risk.  If you do not have HIV, but are at risk, it may be recommended that you  take a prescription medicine daily to prevent HIV infection. This is called pre-exposure prophylaxis (PrEP). You are considered at risk if:  You are sexually active and do not regularly use condoms or know the HIV status of your partner(s).  You take drugs by injection.  You are sexually active with a partner who has HIV. Talk with your health care provider about whether you  are at high risk of being infected with HIV. If you choose to begin PrEP, you should first be tested for HIV. You should then be tested every 3 months for as long as you are taking PrEP.  PREGNANCY   If you are premenopausal and you may become pregnant, ask your health care provider about preconception counseling.  If you may become pregnant, take 400 to 800 micrograms (mcg) of folic acid every day.  If you want to prevent pregnancy, talk to your health care provider about birth control (contraception). OSTEOPOROSIS AND MENOPAUSE   Osteoporosis is a disease in which the bones lose minerals and strength with aging. This can result in serious bone fractures. Your risk for osteoporosis can be identified using a bone density scan.  If you are 68 years of age or older, or if you are at risk for osteoporosis and fractures, ask your health care provider if you should be screened.  Ask your health care provider whether you should take a calcium or vitamin D supplement to lower your risk for osteoporosis.  Menopause may have certain physical symptoms and risks.  Hormone replacement therapy may reduce some of these symptoms and risks. Talk to your health care provider about whether hormone replacement therapy is right for you.  HOME CARE INSTRUCTIONS   Schedule regular health, dental, and eye exams.  Stay current with your immunizations.   Do not use any tobacco products including cigarettes, chewing tobacco, or electronic cigarettes.  If you are pregnant, do not drink alcohol.  If you are breastfeeding, limit how  much and how often you drink alcohol.  Limit alcohol intake to no more than 1 drink per day for nonpregnant women. One drink equals 12 ounces of beer, 5 ounces of wine, or 1 ounces of hard liquor.  Do not use street drugs.  Do not share needles.  Ask your health care provider for help if you need support or information about quitting drugs.  Tell your health care provider if you often feel depressed.  Tell your health care provider if you have ever been abused or do not feel safe at home.   This information is not intended to replace advice given to you by your health care provider. Make sure you discuss any questions you have with your health care provider.   Document Released: 01/28/2011 Document Revised: 08/05/2014 Document Reviewed: 06/16/2013 Elsevier Interactive Patient Education [employer].

## 2016-03-20 ENCOUNTER — Other Ambulatory Visit: Payer: Self-pay | Admitting: Adult Health

## 2016-03-20 ENCOUNTER — Encounter: Payer: Self-pay | Admitting: Adult Health

## 2016-03-20 LAB — HEPATITIS C ANTIBODY: HCV Ab: NEGATIVE

## 2016-03-20 LAB — TSH: TSH: 1.72 u[IU]/mL (ref 0.35–4.50)

## 2016-03-20 MED ORDER — LEVOTHYROXINE SODIUM 137 MCG PO TABS: 137.0000 ug | ORAL_TABLET | Freq: Every day | ORAL | 2 refills | Status: DC

## 2016-03-28 ENCOUNTER — Other Ambulatory Visit: Payer: Self-pay | Admitting: Adult Health

## 2016-03-28 NOTE — Telephone Encounter (Signed)
Ok to refill 

## 2016-03-28 NOTE — Telephone Encounter (Signed)
This was sent in on 03/20/2016

## 2016-04-08 ENCOUNTER — Encounter: Payer: Self-pay | Admitting: Adult Health

## 2016-04-08 ENCOUNTER — Ambulatory Visit (INDEPENDENT_AMBULATORY_CARE_PROVIDER_SITE_OTHER): Payer: BLUE CROSS/BLUE SHIELD | Admitting: Adult Health

## 2016-04-08 VITALS — BP 106/64 | Temp 98.6°F | Ht 61.0 in | Wt 184.4 lb

## 2016-04-08 DIAGNOSIS — Z23 Encounter for immunization: Secondary | ICD-10-CM

## 2016-04-08 DIAGNOSIS — J209 Acute bronchitis, unspecified: Secondary | ICD-10-CM | POA: Diagnosis not present

## 2016-04-08 MED ORDER — PREDNISONE 10 MG PO TABS: ORAL_TABLET | ORAL | 0 refills | Status: DC

## 2016-04-08 MED ORDER — HYDROCODONE-HOMATROPINE 5-1.5 MG/5ML PO SYRP: 5.0000 mL | ORAL_SOLUTION | Freq: Three times a day (TID) | ORAL | 0 refills | Status: DC | PRN

## 2016-04-08 MED ORDER — GLIPIZIDE 10 MG PO TABS: ORAL_TABLET | ORAL | 2 refills | Status: DC

## 2016-04-08 MED ORDER — IPRATROPIUM-ALBUTEROL 0.5-2.5 (3) MG/3ML IN SOLN: 3.0000 mL | Freq: Four times a day (QID) | RESPIRATORY_TRACT | Status: DC

## 2016-04-08 MED ORDER — IPRATROPIUM-ALBUTEROL 0.5-2.5 (3) MG/3ML IN SOLN: 3.0000 mL | Freq: Once | RESPIRATORY_TRACT | Status: DC

## 2016-04-08 NOTE — Progress Notes (Signed)
Subjective:    Patient ID: Alexandra [name], female    DOB: Aug 24, 1960, 55 y.o.   MRN: [id number]  Cough  This is a new problem. The current episode started 1 to 4 weeks ago. The problem has been unchanged. The problem occurs constantly. The cough is non-productive. Associated symptoms include shortness of breath and wheezing. Pertinent negatives include no chest pain, chills, ear congestion, ear pain, fever, headaches, heartburn, nasal congestion, postnasal drip or rhinorrhea. Nothing aggravates the symptoms. She has tried OTC cough suppressant, rest and leukotriene antagonists for the symptoms. The treatment provided no relief. There is no history of asthma, bronchitis, COPD, environmental allergies or pneumonia.     Review of Systems  Constitutional: Negative.  Negative for activity change, chills, diaphoresis, fatigue and fever.  HENT: Negative for ear pain, mouth sores, postnasal drip, rhinorrhea, sinus pressure and sneezing.   Eyes: Negative.   Respiratory: Positive for cough, chest tightness, shortness of breath and wheezing. Negative for apnea.   Cardiovascular: Negative for chest pain.  Gastrointestinal: Negative for heartburn.  Genitourinary: Negative.   Musculoskeletal: Negative.   Allergic/Immunologic: Negative for environmental allergies.  Neurological: Negative.  Negative for headaches.  Hematological: Negative.    Past Medical History:  Diagnosis Date  . Diabetes mellitus ([place])   . Fatty liver   . GERD (gastroesophageal reflux disease)    prn med.  . Hiatal hernia 1998  . Hyperlipidemia   . Hyperplastic colon polyp   . Hypertension    new dx. - will start med. 06/28/2011  . Hyperthyroidism   . Hypothyroidism   . Rotator cuff tear, right   . Sleep apnea    prior to gastric bypass - no sleep study since bypass    Social History   Social History  . Marital status: Married    Spouse name: N/A  . Number of children: N/A  . Years of education: N/A    Occupational History  . Not on file.   Social History Main Topics  . Smoking status: Never Smoker  . Smokeless tobacco: Never Used  . Alcohol use No  . Drug use: No  . Sexual activity: Yes   Other Topics Concern  . Not on file   Social History Narrative   Married for 24 years    Six children- all live around here. Two daughters live with her   Six grand children    On Disability for mental issues   No pets   Likes to go to movies and out to dinner. She likes to do puzzle books. Likes to read      She eats a healthy diet   She does not exercise              Past Surgical History:  Procedure Laterality Date  . ABDOMINAL HYSTERECTOMY  06/25/2000   left salpingo-oophorectomy  . BACK SURGERY  1997   discetomy, lumbar spinal fusion  . CARDIAC CATHETERIZATION  04/01/2006  . CESAREAN SECTION  1991  . CHOLECYSTECTOMY  1989  . EXPLORATORY LAPAROTOMY  05/08/2000   right salpingo-oophorectomy  . LAMINOTOMY / EXCISION DISK POSTERIOR CERVICAL SPINE  08/10/2009   C7-T1  . 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 SHOULDER ARTHROSCOPY  01/02/2006; 05/31/2004   left 2007; right 2005  . SHOULDER ARTHROSCOPY  07/02/2011   Procedure: ARTHROSCOPY SHOULDER;  Surgeon: Cammie Sickle., MD;  Location: [facility];  Service: Orthopedics;  Laterality: Right;  repair supraspinatus, Debride glenohumeral joint, labrum  .  SHOULDER ARTHROSCOPY DISTAL CLAVICLE EXCISION AND OPEN ROTATOR CUFF REPAIR  07/26/2010   right  . THYROID SURGERY  early 2000s   goiter  . TUBAL LIGATION      Family History  Problem Relation Age of Onset  . Coronary artery disease    . Cancer Paternal Grandfather     Prostate Cancer  . Diabetes Mother   . Hyperlipidemia Mother   . Hypertension Mother   . Thyroid disease Mother   . Rheum arthritis Mother   . COPD Mother     Allergies  Allergen Reactions  . Shrimp Flavor Swelling    Swelling of tongue and rash  . Ace Inhibitors Cough  .  Oxycodone-Acetaminophen Rash    Current Outpatient Prescriptions on File Prior to Visit  Medication Sig Dispense Refill  . aspirin 325 MG tablet Take 1 tablet (325 mg total) by mouth daily. For heart health 30 tablet 3  . benztropine (COGENTIN) 1 MG tablet TAKE 1 TABLET TWICE A DAY FOR PREVENTION OF DRUG RELATED INVOLUNTARY MOVEMENTS 180 tablet 3  . bisoprolol-hydrochlorothiazide (ZIAC) 5-6.25 MG tablet TAKE 1 TABLET DAILY FOR HIGH BLOOD PRESSURE CONTROL 90 tablet 3  . citalopram (CELEXA) 40 MG tablet Take 1 tablet (40 mg total) by mouth daily. Reported on 09/19/2015 90 tablet 1  . glipiZIDE (GLUCOTROL) 10 MG tablet TAKE 1 TABLET TWICE A DAY BEFORE A MEAL (Patient not taking: Reported on 12/12/2015) 180 tablet 2  . glucose blood (ONE TOUCH ULTRA TEST) test strip Test once daily dxE11.9 100 each 3  . levothyroxine (SYNTHROID) 137 MCG tablet Take 1 tablet (137 mcg total) by mouth daily before breakfast. 90 tablet 2  . lidocaine (LIDODERM) 5 % Place 1 patch onto the skin daily. Remove & Discard patch within 12 hours or as directed by MD: For pain 90 patch 3  . lubiprostone (AMITIZA) 24 MCG capsule Take 24 mcg by mouth 2 (two) times daily with a meal.    . metFORMIN (GLUCOPHAGE) 1000 MG tablet TAKE 1 TABLET TWICE A DAY WITH A MEAL. 180 tablet 3  . MOVANTIK 12.5 MG TABS tablet     . nitroGLYCERIN (NITROSTAT) 0.4 MG SL tablet Place 1 tablet (0.4 mg total) under the tongue every 5 (five) minutes as needed for chest pain (CP or SOB). (Patient not taking: Reported on 04/08/2016) 60 tablet 12  . OLANZapine (ZYPREXA) 20 MG tablet Take 1 tablet (20 mg total) by mouth at bedtime. 90 tablet 1  . oxyCODONE-acetaminophen (PERCOCET) 7.5-325 MG per tablet Take 1 tablet by mouth 4 (four) times daily.  0  . pregabalin (LYRICA) 200 MG capsule Take 200 mg by mouth 2 (two) times daily.    . simvastatin (ZOCOR) 20 MG tablet TAKE 1 TABLET AT BEDTIME FOR HIGH CHOLESTEROL CONTROL 90 tablet 3  . traZODone (DESYREL) 100 MG  tablet Take 1 tablet (100 mg total) by mouth at bedtime. Reported on 09/19/2015 90 tablet 1  . [DISCONTINUED] DULoxetine (CYMBALTA) 20 MG capsule Take 20 mg by mouth daily. Samples per Dr Arnoldo Morale    . [DISCONTINUED] gabapentin (NEURONTIN) 300 MG capsule Take 300 mg by mouth 2 (two) times daily. Per Dr Daylene Katayama     No current facility-administered medications on file prior to visit.     BP 106/64   Temp 98.6 F (37 C) (Oral)   Ht 5\' 1"  (1.549 m)   Wt 184 lb 6.4 oz (83.6 kg)   BMI 34.84 kg/m       Objective:  Physical Exam  Constitutional: She is oriented to person, place, and time. She appears well-developed and well-nourished. No distress.  HENT:  Head: Normocephalic and atraumatic.  Right Ear: External ear normal.  Left Ear: External ear normal.  Nose: Nose normal.  Mouth/Throat: Oropharynx is clear and moist. No oropharyngeal exudate.  Eyes: Conjunctivae and EOM are normal. Pupils are equal, round, and reactive to light. Right eye exhibits no discharge. Left eye exhibits no discharge. No scleral icterus.  Neck: Normal range of motion. Neck supple.  Cardiovascular: Normal rate, regular rhythm, normal heart sounds and intact distal pulses.  Exam reveals no gallop and no friction rub.   No murmur heard. Pulmonary/Chest: Effort normal. No respiratory distress. She has wheezes in the right middle field, the right lower field and the left middle field. She has no rhonchi. She has no rales. She exhibits no tenderness.  Neurological: She is alert and oriented to person, place, and time. She has normal reflexes.  Skin: Skin is warm and dry. No rash noted. She is not diaphoretic. No erythema. No pallor.  Psychiatric: She has a normal mood and affect. Her behavior is normal. Judgment and thought content normal.  Nursing note and vitals reviewed.     Assessment & Plan:   1. Acute bronchitis, unspecified organism - No concern for pneumonia at this time - HYDROcodone-homatropine (HYCODAN)  5-1.5 MG/5ML syrup; Take 5 mLs by mouth every 8 (eight) hours as needed for cough.  Dispense: 120 mL; Refill: 0 - predniSONE (DELTASONE) 10 MG tablet; 40 mg x 3 days, 20 mg x 3 days, 10 mg x 3 days  Dispense: 21 tablet; Refill: 0 - ipratropium-albuterol (DUONEB) 0.5-2.5 (3) MG/3ML nebulizer solution 3 mL; Take 3 mLs by nebulization once. - Patient endorsed feeling as though she could breath easier after neb treatment. She was moving air easier on exam  2. Need for prophylactic vaccination and inoculation against influenza  - Flu Vaccine QUAD 36+ mos PF IM (Fluarix & Fluzone Quad PF)  Dorothyann Peng, NP

## 2016-04-08 NOTE — Patient Instructions (Addendum)
It was great seeing you again   Your exam is consistent with bronchitis.   I have sent in a prescription for prednisone, take as directed. Make sure you drink a lot of water as this medication will make your blood sugars go up.   Use the cough syrup at night- it will make you sleepy  Follow up if no improvement   Acute Bronchitis Bronchitis is inflammation of the airways that extend from the windpipe into the lungs (bronchi). The inflammation often causes mucus to develop. This leads to a cough, which is the most common symptom of bronchitis.  In acute bronchitis, the condition usually develops suddenly and goes away over time, usually in a couple weeks. Smoking, allergies, and asthma can make bronchitis worse. Repeated episodes of bronchitis may cause further lung problems.  CAUSES Acute bronchitis is most often caused by the same virus that causes a cold. The virus can spread from person to person (contagious) through coughing, sneezing, and touching contaminated objects. SIGNS AND SYMPTOMS   Cough.   Fever.   Coughing up mucus.   Body aches.   Chest congestion.   Chills.   Shortness of breath.   Sore throat.  DIAGNOSIS  Acute bronchitis is usually diagnosed through a physical exam. Your health care provider will also ask you questions about your medical history. Tests, such as chest X-rays, are sometimes done to rule out other conditions.  TREATMENT  Acute bronchitis usually goes away in a couple weeks. Oftentimes, no medical treatment is necessary. Medicines are sometimes given for relief of fever or cough. Antibiotic medicines are usually not needed but may be prescribed in certain situations. In some cases, an inhaler may be recommended to help reduce shortness of breath and control the cough. A cool mist vaporizer may also be used to help thin bronchial secretions and make it easier to clear the chest.  HOME CARE INSTRUCTIONS  Get plenty of rest.   Drink  enough fluids to keep your urine clear or pale yellow (unless you have a medical condition that requires fluid restriction). Increasing fluids may help thin your respiratory secretions (sputum) and reduce chest congestion, and it will prevent dehydration.   Take medicines only as directed by your health care provider.  If you were prescribed an antibiotic medicine, finish it all even if you start to feel better.  Avoid smoking and secondhand smoke. Exposure to cigarette smoke or irritating chemicals will make bronchitis worse. If you are a smoker, consider using nicotine gum or skin patches to help control withdrawal symptoms. Quitting smoking will help your lungs heal faster.   Reduce the chances of another bout of acute bronchitis by washing your hands frequently, avoiding people with cold symptoms, and trying not to touch your hands to your mouth, nose, or eyes.   Keep all follow-up visits as directed by your health care provider.  SEEK MEDICAL CARE IF: Your symptoms do not improve after 1 week of treatment.  SEEK IMMEDIATE MEDICAL CARE IF:  You develop an increased fever or chills.   You have chest pain.   You have severe shortness of breath.  You have bloody sputum.   You develop dehydration.  You faint or repeatedly feel like you are going to pass out.  You develop repeated vomiting.  You develop a severe headache. MAKE SURE YOU:   Understand these instructions.  Will watch your condition.  Will get help right away if you are not doing well or get worse.  This information is not intended to replace advice given to you by your health care provider. Make sure you discuss any questions you have with your health care provider.   Document Released: 08/22/2004 Document Revised: 08/05/2014 Document Reviewed: 01/05/2013 Elsevier Interactive Patient Education [employer].

## 2016-04-17 ENCOUNTER — Other Ambulatory Visit: Payer: Self-pay | Admitting: Adult Health

## 2016-04-17 DIAGNOSIS — Z1231 Encounter for screening mammogram for malignant neoplasm of breast: Secondary | ICD-10-CM

## 2016-04-19 ENCOUNTER — Ambulatory Visit: Payer: Self-pay

## 2016-04-19 ENCOUNTER — Ambulatory Visit
Admission: RE | Admit: 2016-04-19 | Discharge: 2016-04-19 | Disposition: A | Discharge status: Home / Self Care | Payer: BLUE CROSS/BLUE SHIELD | Source: Ambulatory Visit | Attending: Adult Health | Admitting: Adult Health

## 2016-04-19 DIAGNOSIS — Z1231 Encounter for screening mammogram for malignant neoplasm of breast: Secondary | ICD-10-CM

## 2016-04-21 ENCOUNTER — Encounter: Payer: Self-pay | Admitting: Adult Health

## 2016-04-23 LAB — HM DIABETES EYE EXAM

## 2016-04-25 ENCOUNTER — Ambulatory Visit (INDEPENDENT_AMBULATORY_CARE_PROVIDER_SITE_OTHER): Payer: BLUE CROSS/BLUE SHIELD | Admitting: Adult Health

## 2016-04-25 ENCOUNTER — Encounter: Payer: Self-pay | Admitting: Adult Health

## 2016-04-25 ENCOUNTER — Ambulatory Visit (INDEPENDENT_AMBULATORY_CARE_PROVIDER_SITE_OTHER)
Admission: RE | Admit: 2016-04-25 | Discharge: 2016-04-25 | Disposition: A | Discharge status: Home / Self Care | Payer: BLUE CROSS/BLUE SHIELD | Source: Ambulatory Visit | Attending: Adult Health | Admitting: Adult Health

## 2016-04-25 VITALS — BP 110/72 | Ht 61.0 in | Wt 179.8 lb

## 2016-04-25 DIAGNOSIS — J209 Acute bronchitis, unspecified: Secondary | ICD-10-CM

## 2016-04-25 MED ORDER — HYDROCODONE-HOMATROPINE 5-1.5 MG/5ML PO SYRP: 5.0000 mL | ORAL_SOLUTION | Freq: Three times a day (TID) | ORAL | 0 refills | Status: DC | PRN

## 2016-04-25 MED ORDER — PREDNISONE 10 MG PO TABS: ORAL_TABLET | ORAL | 0 refills | Status: DC

## 2016-04-25 MED ORDER — MIRTAZAPINE 15 MG PO TABS: 15.0000 mg | ORAL_TABLET | Freq: Every day | ORAL | 1 refills | Status: DC

## 2016-04-25 NOTE — Progress Notes (Signed)
Subjective:    Patient ID: Alexandra [name], female    DOB: 13-Jan-1961, 55 y.o.   MRN: [id number]  HPI  55 year old female who presents to the office for a continued dry cough that is worse at night. I last saw her on 04/08/2016 for this issue and prescribed Prednisone taper and Hycodan. She reports that the pharmacy only gave her enough prednisone for two days. She used the Hycodan at night which helped her sleep.   She has been using Mucinex over the counter which helps minimally.   She denies any fevers or feeling acutely   Review of Systems  Constitutional: Negative.   HENT: Negative.   Respiratory: Positive for cough, shortness of breath and wheezing. Negative for choking and chest tightness.   Cardiovascular: Negative.   Musculoskeletal: Negative.   Skin: Negative.   Neurological: Negative.   All other systems reviewed and are negative.  Past Medical History:  Diagnosis Date  . Diabetes mellitus ([place])   . Fatty liver   . GERD (gastroesophageal reflux disease)    prn med.  . Hiatal hernia 1998  . Hyperlipidemia   . Hyperplastic colon polyp   . Hypertension    new dx. - will start med. 06/28/2011  . Hyperthyroidism   . Hypothyroidism   . Rotator cuff tear, right   . Sleep apnea    prior to gastric bypass - no sleep study since bypass    Social History   Social History  . Marital status: Married    Spouse name: N/A  . Number of children: N/A  . Years of education: N/A   Occupational History  . Not on file.   Social History Main Topics  . Smoking status: Never Smoker  . Smokeless tobacco: Never Used  . Alcohol use No  . Drug use: No  . Sexual activity: Yes   Other Topics Concern  . Not on file   Social History Narrative   Married for 24 years    Six children- all live around here. Two daughters live with her   Six grand children    On Disability for mental issues   No pets   Likes to go to movies and out to dinner. She likes to do puzzle books.  Likes to read      She eats a healthy diet   She does not exercise              Past Surgical History:  Procedure Laterality Date  . ABDOMINAL HYSTERECTOMY  06/25/2000   left salpingo-oophorectomy  . BACK SURGERY  1997   discetomy, lumbar spinal fusion  . CARDIAC CATHETERIZATION  04/01/2006  . CESAREAN SECTION  1991  . CHOLECYSTECTOMY  1989  . EXPLORATORY LAPAROTOMY  05/08/2000   right salpingo-oophorectomy  . LAMINOTOMY / EXCISION DISK POSTERIOR CERVICAL SPINE  08/10/2009   C7-T1  . 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 SHOULDER ARTHROSCOPY  01/02/2006; 05/31/2004   left 2007; right 2005  . SHOULDER ARTHROSCOPY  07/02/2011   Procedure: ARTHROSCOPY SHOULDER;  Surgeon: Cammie Sickle., MD;  Location: [facility];  Service: Orthopedics;  Laterality: Right;  repair supraspinatus, Debride glenohumeral joint, labrum  . SHOULDER ARTHROSCOPY DISTAL CLAVICLE EXCISION AND OPEN ROTATOR CUFF REPAIR  07/26/2010   right  . THYROID SURGERY  early 2000s   goiter  . TUBAL LIGATION      Family History  Problem Relation Age of Onset  . Coronary artery disease    .  Cancer Paternal Grandfather     Prostate Cancer  . Diabetes Mother   . Hyperlipidemia Mother   . Hypertension Mother   . Thyroid disease Mother   . Rheum arthritis Mother   . COPD Mother     Allergies  Allergen Reactions  . Shrimp Flavor Swelling    Swelling of tongue and rash  . Ace Inhibitors Cough  . Oxycodone-Acetaminophen Rash    Current Outpatient Prescriptions on File Prior to Visit  Medication Sig Dispense Refill  . aspirin 325 MG tablet Take 1 tablet (325 mg total) by mouth daily. For heart health 30 tablet 3  . benztropine (COGENTIN) 1 MG tablet TAKE 1 TABLET TWICE A DAY FOR PREVENTION OF DRUG RELATED INVOLUNTARY MOVEMENTS 180 tablet 3  . bisoprolol-hydrochlorothiazide (ZIAC) 5-6.25 MG tablet TAKE 1 TABLET DAILY FOR HIGH BLOOD PRESSURE CONTROL 90 tablet 3  . citalopram (CELEXA) 40 MG tablet  Take 1 tablet (40 mg total) by mouth daily. Reported on 09/19/2015 90 tablet 1  . glipiZIDE (GLUCOTROL) 10 MG tablet TAKE 1 TABLET TWICE A DAY BEFORE A MEAL 180 tablet 2  . glucose blood (ONE TOUCH ULTRA TEST) test strip Test once daily dxE11.9 100 each 3  . HYDROcodone-homatropine (HYCODAN) 5-1.5 MG/5ML syrup Take 5 mLs by mouth every 8 (eight) hours as needed for cough. 120 mL 0  . levothyroxine (SYNTHROID) 137 MCG tablet Take 1 tablet (137 mcg total) by mouth daily before breakfast. 90 tablet 2  . lidocaine (LIDODERM) 5 % Place 1 patch onto the skin daily. Remove & Discard patch within 12 hours or as directed by MD: For pain 90 patch 3  . lubiprostone (AMITIZA) 24 MCG capsule Take 24 mcg by mouth 2 (two) times daily with a meal.    . metFORMIN (GLUCOPHAGE) 1000 MG tablet TAKE 1 TABLET TWICE A DAY WITH A MEAL. 180 tablet 3  . MOVANTIK 12.5 MG TABS tablet     . nitroGLYCERIN (NITROSTAT) 0.4 MG SL tablet Place 1 tablet (0.4 mg total) under the tongue every 5 (five) minutes as needed for chest pain (CP or SOB). 60 tablet 12  . OLANZapine (ZYPREXA) 20 MG tablet Take 1 tablet (20 mg total) by mouth at bedtime. 90 tablet 1  . oxyCODONE-acetaminophen (PERCOCET) 7.5-325 MG per tablet Take 1 tablet by mouth 4 (four) times daily.  0  . pregabalin (LYRICA) 200 MG capsule Take 200 mg by mouth 2 (two) times daily.    . simvastatin (ZOCOR) 20 MG tablet TAKE 1 TABLET AT BEDTIME FOR HIGH CHOLESTEROL CONTROL 90 tablet 3  . traZODone (DESYREL) 100 MG tablet Take 1 tablet (100 mg total) by mouth at bedtime. Reported on 09/19/2015 90 tablet 1  . [DISCONTINUED] DULoxetine (CYMBALTA) 20 MG capsule Take 20 mg by mouth daily. Samples per Dr Arnoldo Morale    . [DISCONTINUED] gabapentin (NEURONTIN) 300 MG capsule Take 300 mg by mouth 2 (two) times daily. Per Dr Daylene Katayama     Current Facility-Administered Medications on File Prior to Visit  Medication Dose Route Frequency Provider Last Rate Last Dose  . ipratropium-albuterol  (DUONEB) 0.5-2.5 (3) MG/3ML nebulizer solution 3 mL  3 mL Nebulization Once Dorothyann Peng, NP        BP 110/72   Ht 5\' 1"  (1.549 m)   Wt 179 lb 12.8 oz (81.6 kg)   BMI 33.97 kg/m       Objective:   Physical Exam  Constitutional: She is oriented to person, place, and time. She appears well-developed and  well-nourished. No distress.  HENT:  Head: Normocephalic and atraumatic.  Right Ear: External ear normal.  Left Ear: External ear normal.  Nose: Nose normal.  Mouth/Throat: Oropharynx is clear and moist. No oropharyngeal exudate.  Cardiovascular: Normal rate, regular rhythm, normal heart sounds and intact distal pulses.  Exam reveals no gallop and no friction rub.   No murmur heard. Pulmonary/Chest: Effort normal and breath sounds normal. No respiratory distress. She has no wheezes. She has no rales. She exhibits no tenderness.  Trace wheezing with cough. No strider noticed  Neurological: She is alert and oriented to person, place, and time.  Skin: Skin is warm and dry. No rash noted. She is not diaphoretic. No erythema. No pallor.  Psychiatric: She has a normal mood and affect. Her behavior is normal. Judgment and thought content normal.  Vitals reviewed.     Assessment & Plan:  1. Acute bronchitis, unspecified organism - DG Chest 2 View; Future - HYDROcodone-homatropine (HYCODAN) 5-1.5 MG/5ML syrup; Take 5 mLs by mouth every 8 (eight) hours as needed for cough.  Dispense: 120 mL; Refill: 0 - predniSONE (DELTASONE) 10 MG tablet; 40 mg x 3 days, 20 mg x 3 days, 10 mg x 3 days  Dispense: 21 tablet; Refill: 0 - Follow up if no improvement  [employer], AGNP

## 2016-05-01 ENCOUNTER — Encounter: Payer: Self-pay | Admitting: Adult Health

## 2016-05-06 ENCOUNTER — Encounter: Payer: Self-pay | Admitting: Pulmonary Disease

## 2016-05-06 ENCOUNTER — Ambulatory Visit (INDEPENDENT_AMBULATORY_CARE_PROVIDER_SITE_OTHER): Payer: BLUE CROSS/BLUE SHIELD | Admitting: Pulmonary Disease

## 2016-05-06 VITALS — BP 118/84 | HR 81 | Ht 62.5 in | Wt 189.2 lb

## 2016-05-06 DIAGNOSIS — G4733 Obstructive sleep apnea (adult) (pediatric): Secondary | ICD-10-CM | POA: Diagnosis not present

## 2016-05-06 DIAGNOSIS — R351 Nocturia: Secondary | ICD-10-CM

## 2016-05-06 DIAGNOSIS — F513 Sleepwalking [somnambulism]: Secondary | ICD-10-CM

## 2016-05-06 NOTE — Patient Instructions (Signed)
Will arrange for in sleep study Will call to arrange for follow up after sleep study reviewed  

## 2016-05-06 NOTE — Progress Notes (Signed)
   Subjective:    Patient ID: Alexandra [name], female    DOB: [birth year], 55 y.o.   MRN: [id number]  HPI    Review of Systems  Constitutional: Negative for fever and unexpected weight change.  HENT: Negative for congestion, dental problem, ear pain, nosebleeds, postnasal drip, rhinorrhea, sinus pressure, sneezing, sore throat and trouble swallowing.   Eyes: Negative for redness and itching.  Respiratory: Positive for cough. Negative for chest tightness, shortness of breath and wheezing.   Cardiovascular: Negative for palpitations and leg swelling.  Gastrointestinal: Negative for nausea and vomiting.  Genitourinary: Negative for dysuria.  Musculoskeletal: Negative for joint swelling.  Skin: Negative for rash.  Neurological: Positive for headaches.  Hematological: Does not bruise/bleed easily.  Psychiatric/Behavioral: Negative for dysphoric mood. The patient is not nervous/anxious.        Objective:   Physical Exam        Assessment & Plan:

## 2016-05-06 NOTE — Progress Notes (Signed)
Past Surgical History She  has a past surgical history that includes Cesarean section (1991); Cholecystectomy (1989); Shoulder arthroscopy distal clavicle excision and open rotator cuff repair (07/26/2010); Shoulder arthroscopy (01/02/2006; 05/31/2004); Exploratory laparotomy (05/08/2000); Abdominal hysterectomy (06/25/2000); Roux-en-Y Gastric Bypass (05/19/2007); Laminotomy / excision disk posterior cervical spine (08/10/2009); Thyroid surgery (early 2000s); Cardiac catheterization (04/01/2006); Shoulder arthroscopy (07/02/2011); Back surgery (1997); and Tubal ligation.  Allergies  Allergen Reactions  . Shrimp Flavor Swelling    Swelling of tongue and rash  . Ace Inhibitors Cough  . Oxycodone-Acetaminophen Rash    Family History Her family history includes COPD in her mother; Cancer in her paternal grandfather; Diabetes in her mother; Hyperlipidemia in her mother; Hypertension in her mother; Rheum arthritis in her mother; Thyroid disease in her mother.  Social History She  reports that she quit smoking about 31 years ago. Her smoking use included Cigarettes. She has a 10.00 pack-year smoking history. She has never used smokeless tobacco. She reports that she drinks alcohol. She reports that she does not use drugs.  Review of systems Constitutional: Negative for fever and unexpected weight change.  HENT: Negative for congestion, dental problem, ear pain, nosebleeds, postnasal drip, rhinorrhea, sinus pressure, sneezing, sore throat and trouble swallowing.   Eyes: Negative for redness and itching.  Respiratory: Positive for cough. Negative for chest tightness, shortness of breath and wheezing.   Cardiovascular: Negative for palpitations and leg swelling.  Gastrointestinal: Negative for nausea and vomiting.  Genitourinary: Negative for dysuria.  Musculoskeletal: Negative for joint swelling.  Skin: Negative for rash.  Neurological: Positive for headaches.  Hematological: Does not bruise/bleed  easily.  Psychiatric/Behavioral: Negative for dysphoric mood. The patient is not nervous/anxious.     Current Outpatient Prescriptions on File Prior to Visit  Medication Sig  . aspirin 325 MG tablet Take 1 tablet (325 mg total) by mouth daily. For heart health  . benztropine (COGENTIN) 1 MG tablet TAKE 1 TABLET TWICE A DAY FOR PREVENTION OF DRUG RELATED INVOLUNTARY MOVEMENTS  . bisoprolol-hydrochlorothiazide (ZIAC) 5-6.25 MG tablet TAKE 1 TABLET DAILY FOR HIGH BLOOD PRESSURE CONTROL  . citalopram (CELEXA) 40 MG tablet Take 1 tablet (40 mg total) by mouth daily. Reported on 09/19/2015  . glipiZIDE (GLUCOTROL) 10 MG tablet TAKE 1 TABLET TWICE A DAY BEFORE A MEAL  . glucose blood (ONE TOUCH ULTRA TEST) test strip Test once daily dxE11.9  . HYDROcodone-homatropine (HYCODAN) 5-1.5 MG/5ML syrup Take 5 mLs by mouth every 8 (eight) hours as needed for cough.  . levothyroxine (SYNTHROID) 137 MCG tablet Take 1 tablet (137 mcg total) by mouth daily before breakfast.  . metFORMIN (GLUCOPHAGE) 1000 MG tablet TAKE 1 TABLET TWICE A DAY WITH A MEAL.  Marland Kitchen MOVANTIK 12.5 MG TABS tablet   . nitroGLYCERIN (NITROSTAT) 0.4 MG SL tablet Place 1 tablet (0.4 mg total) under the tongue every 5 (five) minutes as needed for chest pain (CP or SOB).  Marland Kitchen OLANZapine (ZYPREXA) 20 MG tablet Take 1 tablet (20 mg total) by mouth at bedtime.  Marland Kitchen oxyCODONE-acetaminophen (PERCOCET) 7.5-325 MG per tablet Take 1 tablet by mouth 4 (four) times daily.  . pregabalin (LYRICA) 200 MG capsule Take 200 mg by mouth 2 (two) times daily.  . simvastatin (ZOCOR) 20 MG tablet TAKE 1 TABLET AT BEDTIME FOR HIGH CHOLESTEROL CONTROL  . traZODone (DESYREL) 100 MG tablet Take 1 tablet (100 mg total) by mouth at bedtime. Reported on 09/19/2015  . [DISCONTINUED] DULoxetine (CYMBALTA) 20 MG capsule Take 20 mg by mouth daily. Samples per  Dr Arnoldo Morale  . [DISCONTINUED] gabapentin (NEURONTIN) 300 MG capsule Take 300 mg by mouth 2 (two) times daily. Per Dr Daylene Katayama    No current facility-administered medications on file prior to visit.     Chief Complaint  Patient presents with  . SLEEP CONSULT    Referred by Dr Diona Fanti. Sleep study about 10 years ago at [facility]. No CPAP treatment. Epworth Score: 19    Sleep tests: PSG 04/14/02 >> AHI 20, SpO2 low 70%  Cardiac tests: Echo 01/18/13 >> EF 60 to 65%, mild LA dilation  Past medical history She  has a past medical history of Diabetes mellitus ([place]); Fatty liver; GERD (gastroesophageal reflux disease); Hiatal hernia (1998); Hyperlipidemia; Hyperplastic colon polyp; Hypertension; Hyperthyroidism; Hypothyroidism; Rotator cuff tear, right; and Sleep apnea.  Vital signs BP 118/84 (BP Location: Right Arm, Cuff Size: Normal)   Pulse 81   Ht 5' 2.5" (1.588 m)   Wt 189 lb 3.2 oz (85.8 kg)   SpO2 92%   BMI 34.05 kg/m   History of Present Illness Alexandra [name] is a 55 y.o. female for evaluation of sleep problems.  She was seen recently by urology for irritable bladder.  She was noted to have nocturia with enuresis.  She has hx of sleep apnea, and had gastric bypass surgery.  She lost weight and her sleep apnea symptoms improved.  She has never been on therapy for sleep apnea.  She regained weight since her original surgery.  She is again having snoring, and her husband says she stops breathing while asleep.  She can't sleep on her back.  She gets vivid dreams, and dry mouth at night.  She wakes up feeling like she can't catch her breath at times.  She had few episodes of sleep walking, and does talk in her sleep.  She has been told she grinds her teeth at night also.  She has fallen asleep while talking to her husband.  She struggles to stay awake when watching TV.  She goes to sleep at 11 pm.  She falls asleep 1 hour.  She wakes up 2 times to use the bathroom.  She gets out of bed at 9 am.  She feels tired in the morning.  She denies morning headache.  She uses trazodone at night.  She needs to energy  drinks during the day.  She denies sleep walking, sleep talking, bruxism, or nightmares.  There is no history of restless legs.  She denies sleep hallucinations, sleep paralysis, or cataplexy.  The Epworth score is 19 out of 24.  Physical Exam:  General - No distress ENT - No sinus tenderness, no oral exudate, no LAN, no thyromegaly, TM clear, pupils equal/reactive, MP 3, low laying soft palate Cardiac - s1s2 regular, no murmur, pulses symmetric Chest - No wheeze/rales/dullness, good air entry, normal respiratory excursion Back - No focal tenderness Abd - Soft, non-tender, no organomegaly, + bowel sounds Ext - No edema Neuro - Normal strength, cranial nerves intact Skin - No rashes Psych - Normal mood, and behavior  Discussion: She has snoring, sleep disruption, apnea, and daytime sleepiness.  She has prior hx of obstructive sleep apnea that improved with weight loss >> symptoms recurred after weight gain.  She has sleep walking and nocturia.  She also has hx of DM and HTN.  I am concerned she could have recurrent sleep apnea.  We discussed how sleep apnea can affect various health problems, including risks for hypertension, cardiovascular disease, and diabetes.  We  also discussed how sleep disruption can increase risks for accidents, such as while driving.  Weight loss as a means of improving sleep apnea was also reviewed.  Additional treatment options discussed were CPAP therapy, oral appliance, and surgical intervention.  Assessment/plan:  Obstructive sleep apnea. - will arrange for in lab sleep study to further assess  Sleep walking. - could be related to arousals from sleep apnea - assess further after review of her sleep study  Nocturia with enuresis. - reassess after her sleep study is done   Patient Instructions  Will arrange for in sleep study Will call to arrange for follow up after sleep study reviewed    Chesley Mires, M.D. Pager [phone] 05/06/2016, 5:05  PM

## 2016-05-10 ENCOUNTER — Encounter: Payer: Self-pay | Admitting: Adult Health

## 2016-05-14 ENCOUNTER — Other Ambulatory Visit: Payer: Self-pay | Admitting: Adult Health

## 2016-05-14 ENCOUNTER — Other Ambulatory Visit: Payer: Self-pay

## 2016-05-14 DIAGNOSIS — E118 Type 2 diabetes mellitus with unspecified complications: Secondary | ICD-10-CM

## 2016-05-14 DIAGNOSIS — I10 Essential (primary) hypertension: Secondary | ICD-10-CM

## 2016-05-14 DIAGNOSIS — Z760 Encounter for issue of repeat prescription: Secondary | ICD-10-CM

## 2016-05-14 DIAGNOSIS — E785 Hyperlipidemia, unspecified: Secondary | ICD-10-CM

## 2016-05-14 MED ORDER — METFORMIN HCL 1000 MG PO TABS: ORAL_TABLET | ORAL | 3 refills | Status: DC

## 2016-05-14 MED ORDER — METFORMIN HCL 1000 MG PO TABS: ORAL_TABLET | ORAL | 0 refills | Status: DC

## 2016-05-14 MED ORDER — LEVOTHYROXINE SODIUM 137 MCG PO TABS: 137.0000 ug | ORAL_TABLET | Freq: Every day | ORAL | 2 refills | Status: DC

## 2016-05-17 ENCOUNTER — Telehealth: Payer: Self-pay | Admitting: Adult Health

## 2016-05-17 ENCOUNTER — Other Ambulatory Visit: Payer: Self-pay

## 2016-05-17 MED ORDER — GLUCOSE BLOOD VI STRP: ORAL_STRIP | 12 refills | Status: DC

## 2016-05-17 NOTE — Telephone Encounter (Signed)
Pt had to get a new blood sugar meter Free style freedom lite. But now her old test strips do not work. Need new rx for test strips sent to  [facility]  Pt test 3 X a day

## 2016-05-17 NOTE — Telephone Encounter (Signed)
Rx has been sent in. 

## 2016-05-27 ENCOUNTER — Telehealth: Payer: Self-pay | Admitting: Adult Health

## 2016-05-27 DIAGNOSIS — Z760 Encounter for issue of repeat prescription: Secondary | ICD-10-CM

## 2016-05-27 DIAGNOSIS — E118 Type 2 diabetes mellitus with unspecified complications: Secondary | ICD-10-CM

## 2016-05-27 MED ORDER — ONETOUCH ULTRASOFT LANCETS MISC: 3 refills | Status: DC

## 2016-05-27 MED ORDER — ONETOUCH ULTRA 2 W/DEVICE KIT: 1.0000 | PACK | 0 refills | Status: DC

## 2016-05-27 MED ORDER — GLUCOSE BLOOD VI STRP: ORAL_STRIP | 3 refills | Status: DC

## 2016-05-27 NOTE — Telephone Encounter (Signed)
Pt old glucose machine broke and she would like a rx for a  New machine Test strips to go with it Lancets  [facility] [address] [address]

## 2016-05-27 NOTE — Telephone Encounter (Signed)
I called the pt and she stated she needs to have a OneTouch Ultra 2 glucometer, strips and lancets sent to her pharmacy.  New supplies and a glucometer was sent to the pts pharmacy and she is aware of this.

## 2016-05-29 ENCOUNTER — Telehealth: Payer: Self-pay | Admitting: Adult Health

## 2016-05-29 NOTE — Telephone Encounter (Signed)
° ° °  Pt call to say she need a rx for a one touch vero/ Pt said she has a coupon to get it free, said she also need lancets, test strip   Pharmacy [facility] [address]

## 2016-05-30 ENCOUNTER — Other Ambulatory Visit: Payer: Self-pay

## 2016-05-30 MED ORDER — GLUCOSE BLOOD VI STRP: ORAL_STRIP | 12 refills | Status: DC

## 2016-05-30 MED ORDER — ONETOUCH LANCETS MISC: 3 refills | Status: DC

## 2016-05-30 MED ORDER — ONETOUCH VERIO W/DEVICE KIT
1.0000 [IU] | PACK | Freq: Every day | 0 refills | Status: DC
Start: 2016-05-30 — End: 2021-09-10

## 2016-05-30 NOTE — Telephone Encounter (Signed)
Rx has been sent in. 

## 2016-06-12 ENCOUNTER — Ambulatory Visit (HOSPITAL_BASED_OUTPATIENT_CLINIC_OR_DEPARTMENT_OTHER): Payer: BLUE CROSS/BLUE SHIELD | Attending: Pulmonary Disease | Admitting: Pulmonary Disease

## 2016-06-12 DIAGNOSIS — G4733 Obstructive sleep apnea (adult) (pediatric): Secondary | ICD-10-CM | POA: Diagnosis not present

## 2016-06-12 DIAGNOSIS — Z7984 Long term (current) use of oral hypoglycemic drugs: Secondary | ICD-10-CM | POA: Insufficient documentation

## 2016-06-12 DIAGNOSIS — E119 Type 2 diabetes mellitus without complications: Secondary | ICD-10-CM | POA: Diagnosis not present

## 2016-06-12 DIAGNOSIS — I10 Essential (primary) hypertension: Secondary | ICD-10-CM | POA: Insufficient documentation

## 2016-06-12 DIAGNOSIS — E669 Obesity, unspecified: Secondary | ICD-10-CM | POA: Insufficient documentation

## 2016-06-12 DIAGNOSIS — Z79899 Other long term (current) drug therapy: Secondary | ICD-10-CM | POA: Insufficient documentation

## 2016-06-12 DIAGNOSIS — R351 Nocturia: Secondary | ICD-10-CM

## 2016-06-12 DIAGNOSIS — R5383 Other fatigue: Secondary | ICD-10-CM | POA: Diagnosis not present

## 2016-06-12 DIAGNOSIS — Z6833 Body mass index (BMI) 33.0-33.9, adult: Secondary | ICD-10-CM | POA: Insufficient documentation

## 2016-06-12 DIAGNOSIS — F513 Sleepwalking [somnambulism]: Secondary | ICD-10-CM

## 2016-06-18 ENCOUNTER — Telehealth: Payer: Self-pay | Admitting: Pulmonary Disease

## 2016-06-18 DIAGNOSIS — G4733 Obstructive sleep apnea (adult) (pediatric): Secondary | ICD-10-CM | POA: Diagnosis not present

## 2016-06-18 NOTE — Procedures (Signed)
  Patient Name: Alexandra [name], Alexandra [name] Date: 06/12/2016 Gender: Female D.O.B: [birth year] Age (years): 71 Referring Provider: Chesley Mires MD, ABSM Height (inches): 63 Interpreting Physician: Chesley Mires MD, ABSM Weight (lbs): 183 RPSGT: Laren Everts BMI: 32 MRN: [id number] Neck Size: 17.00  CLINICAL INFORMATION Sleep Study Type: NPSG Indication for sleep study: Daytime Fatigue, Diabetes, Fatigue, Hypertension, Obesity, Parasomnias, Re-Evaluation, Witnesses Apnea / Gasping During Sleep Epworth Sleepiness Score: 22  SLEEP STUDY TECHNIQUE As per the AASM Manual for the Scoring of Sleep and Associated Events v2.3 (April 2016) with a hypopnea requiring 4% desaturations. The channels recorded and monitored were frontal, central and occipital EEG, electrooculogram (EOG), submentalis EMG (chin), nasal and oral airflow, thoracic and abdominal wall motion, anterior tibialis EMG, snore microphone, electrocardiogram, and pulse oximetry.  MEDICATIONS Medications self-administered by patient taken the night of the study : GLIPIZIDE, METFORMIN, OLANZAPINE, ACETAMINOPHEN W/OXYCODONE, SIMVASTATIN, TRAZODONE  SLEEP ARCHITECTURE The study was initiated at 10:33:13 PM and ended at 5:22:21 AM. Sleep onset time was 0.4 minutes and the sleep efficiency was 94.8%. The total sleep time was 388.0 minutes. Stage REM latency was 114.5 minutes. The patient spent 2.45% of the night in stage N1 sleep, 76.29% in stage N2 sleep, 0.00% in stage N3 and 21.26% in REM. Alpha intrusion was absent. Supine sleep was 24.77%.  RESPIRATORY PARAMETERS The overall apnea/hypopnea index (AHI) was 5.9 per hour. There were 10 total apneas, including 9 obstructive, 0 central and 1 mixed apneas. There were 28 hypopneas and 9 RERAs. The AHI during Stage REM sleep was 13.8 per hour. AHI while supine was 16.9 per hour. The mean oxygen saturation was 92.84%. The minimum SpO2 during sleep was 87.00%. Moderate snoring was noted  during this study.  CARDIAC DATA The 2 lead EKG demonstrated sinus rhythm. The mean heart rate was 73.17 beats per minute. Other EKG findings include: None.  LEG MOVEMENT DATA The total PLMS were 0 with a resulting PLMS index of 0.00. Associated arousal with leg movement index was 0.0 .  IMPRESSIONS - This study shows mild obstructive sleep apnea with an AHI of 5.9 and SaO2 low of 87%.  DIAGNOSIS - Obstructive Sleep Apnea (327.23 [G47.33 ICD-10])  RECOMMENDATIONS - Additional therapies include weight loss, CPAP, oral appliance, or surgical assessment.  [Electronically signed] 06/18/2016 09:33 AM  Chesley Mires MD, ABSM Diplomate, American Board of Sleep Medicine   NPI: [id number]

## 2016-06-18 NOTE — Telephone Encounter (Signed)
PSG 06/12/16 >> AHI 5.9, SaO2 low 87%.   Will have my nurse inform pt that sleep study shows mild sleep apnea.  Options are 1) CPAP now, 2) ROV first.  If She is agreeable to CPAP, then please send order for auto CPAP range 5 to 15 cm H2O with heated humidity and mask of choice.  Have download sent 1 month after starting CPAP and set up ROV 2 months after starting CPAP.  ROV can be with me or NP.

## 2016-06-19 ENCOUNTER — Encounter: Payer: Self-pay | Admitting: Adult Health

## 2016-06-19 ENCOUNTER — Ambulatory Visit (INDEPENDENT_AMBULATORY_CARE_PROVIDER_SITE_OTHER): Payer: BLUE CROSS/BLUE SHIELD | Admitting: Adult Health

## 2016-06-19 VITALS — BP 142/78 | Temp 97.8°F | Ht 62.0 in | Wt 187.9 lb

## 2016-06-19 DIAGNOSIS — I10 Essential (primary) hypertension: Secondary | ICD-10-CM | POA: Diagnosis not present

## 2016-06-19 DIAGNOSIS — E119 Type 2 diabetes mellitus without complications: Secondary | ICD-10-CM | POA: Diagnosis not present

## 2016-06-19 DIAGNOSIS — Z760 Encounter for issue of repeat prescription: Secondary | ICD-10-CM

## 2016-06-19 LAB — POCT GLYCOSYLATED HEMOGLOBIN (HGB A1C): HEMOGLOBIN A1C: 6.5

## 2016-06-19 MED ORDER — GLUCOSE BLOOD VI STRP: ORAL_STRIP | 12 refills | Status: DC

## 2016-06-19 MED ORDER — BISOPROLOL-HYDROCHLOROTHIAZIDE 10-6.25 MG PO TABS: 1.0000 | ORAL_TABLET | Freq: Every day | ORAL | Status: DC

## 2016-06-19 NOTE — Progress Notes (Signed)
Subjective:    Patient ID: Alexandra [name], female    DOB: 1961-05-07, 55 y.o.   MRN: [id number]  HPI   55 year old female who presents to the office today for follow up regarding hypertension and diabetes.   She has been monitoring her blood sugars at home. She is monitoring three times a day . She reports that her blood sugars have not been controlled, when she checked this morning her blood sugar was 217.   She is not eating a diabetic diet. She does walk the dog but that is it for exercise  Her blood pressure in the office today was 142/78 on two separate occassions.   Lab Results  Component Value Date   HGBA1C 7.0 (H) 03/13/2016   BP Readings from Last 3 Encounters:  06/19/16 (!) 142/78  05/06/16 118/84  04/25/16 110/72    Review of Systems  Constitutional: Negative.   Respiratory: Negative.   Cardiovascular: Negative.   Gastrointestinal: Negative.   Musculoskeletal: Negative.   Skin: Negative.   Neurological: Negative.   All other systems reviewed and are negative.  Past Medical History:  Diagnosis Date  . Diabetes mellitus ([place])   . Fatty liver   . GERD (gastroesophageal reflux disease)    prn med.  . Hiatal hernia 1998  . Hyperlipidemia   . Hyperplastic colon polyp   . Hypertension    new dx. - will start med. 06/28/2011  . Hyperthyroidism   . Hypothyroidism   . Rotator cuff tear, right   . Sleep apnea    prior to gastric bypass - no sleep study since bypass    Social History   Social History  . Marital status: Married    Spouse name: N/A  . Number of children: N/A  . Years of education: N/A   Occupational History  . unemployed    Social History Main Topics  . Smoking status: Former Smoker    Packs/day: 1.00    Years: 10.00    Types: Cigarettes    Quit date: 07/29/1984  . Smokeless tobacco: Never Used  . Alcohol use Yes     Comment: occassionally  . Drug use: No  . Sexual activity: Yes   Other Topics Concern  . Not on file    Social History Narrative   Married for 24 years    Six children- all live around here. Two daughters live with her   Six grand children    On Disability for mental issues   No pets   Likes to go to movies and out to dinner. She likes to do puzzle books. Likes to read      She eats a healthy diet   She does not exercise              Past Surgical History:  Procedure Laterality Date  . ABDOMINAL HYSTERECTOMY  06/25/2000   left salpingo-oophorectomy  . BACK SURGERY  1997   discetomy, lumbar spinal fusion  . CARDIAC CATHETERIZATION  04/01/2006  . CESAREAN SECTION  1991  . CHOLECYSTECTOMY  1989  . EXPLORATORY LAPAROTOMY  05/08/2000   right salpingo-oophorectomy  . LAMINOTOMY / EXCISION DISK POSTERIOR CERVICAL SPINE  08/10/2009   C7-T1  . 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 SHOULDER ARTHROSCOPY  01/02/2006; 05/31/2004   left 2007; right 2005  . SHOULDER ARTHROSCOPY  07/02/2011   Procedure: ARTHROSCOPY SHOULDER;  Surgeon: Cammie Sickle., MD;  Location: [facility];  Service: Orthopedics;  Laterality:  Right;  repair supraspinatus, Debride glenohumeral joint, labrum  . SHOULDER ARTHROSCOPY DISTAL CLAVICLE EXCISION AND OPEN ROTATOR CUFF REPAIR  07/26/2010   right  . THYROID SURGERY  early 2000s   goiter  . TUBAL LIGATION      Family History  Problem Relation Age of Onset  . Coronary artery disease    . Cancer Paternal Grandfather     Prostate Cancer  . Diabetes Mother   . Hyperlipidemia Mother   . Hypertension Mother   . Thyroid disease Mother   . Rheum arthritis Mother   . COPD Mother     Allergies  Allergen Reactions  . Shrimp Flavor Swelling    Swelling of tongue and rash  . Ace Inhibitors Cough  . Oxycodone-Acetaminophen Rash    Current Outpatient Prescriptions on File Prior to Visit  Medication Sig Dispense Refill  . aspirin 325 MG tablet Take 1 tablet (325 mg total) by mouth daily. For heart health 30 tablet 3  . benztropine (COGENTIN) 1  MG tablet TAKE 1 TABLET TWICE A DAY FOR PREVENTION OF DRUG RELATED INVOLUNTARY MOVEMENTS 180 tablet 3  . bisoprolol-hydrochlorothiazide (ZIAC) 5-6.25 MG tablet TAKE 1 TABLET DAILY FOR HIGH BLOOD PRESSURE CONTROL 90 tablet 3  . Blood Glucose Monitoring Suppl (ONETOUCH VERIO) w/Device KIT 1 Units by Does not apply route daily. 1 kit 0  . citalopram (CELEXA) 40 MG tablet Take 1 tablet (40 mg total) by mouth daily. Reported on 09/19/2015 90 tablet 1  . glipiZIDE (GLUCOTROL) 10 MG tablet TAKE 1 TABLET TWICE A DAY BEFORE A MEAL 180 tablet 2  . HYDROcodone-homatropine (HYCODAN) 5-1.5 MG/5ML syrup Take 5 mLs by mouth every 8 (eight) hours as needed for cough. 120 mL 0  . levothyroxine (SYNTHROID) 137 MCG tablet Take 1 tablet (137 mcg total) by mouth daily before breakfast. 90 tablet 2  . metFORMIN (GLUCOPHAGE) 1000 MG tablet TAKE 1 TABLET TWICE A DAY WITH A MEAL. 180 tablet 3  . metFORMIN (GLUCOPHAGE) 1000 MG tablet TAKE 1 TABLET TWICE A DAY WITH A MEAL. 30 tablet 0  . MOVANTIK 12.5 MG TABS tablet     . nitroGLYCERIN (NITROSTAT) 0.4 MG SL tablet Place 1 tablet (0.4 mg total) under the tongue every 5 (five) minutes as needed for chest pain (CP or SOB). 60 tablet 12  . OLANZapine (ZYPREXA) 20 MG tablet Take 1 tablet (20 mg total) by mouth at bedtime. 90 tablet 1  . ONE TOUCH LANCETS MISC Test blood sugars once daily Dx: E11.9 100 each 3  . oxyCODONE-acetaminophen (PERCOCET) 7.5-325 MG per tablet Take 1 tablet by mouth 4 (four) times daily.  0  . pregabalin (LYRICA) 200 MG capsule Take 200 mg by mouth 2 (two) times daily.    . simvastatin (ZOCOR) 20 MG tablet TAKE 1 TABLET AT BEDTIME FOR HIGH CHOLESTEROL CONTROL 90 tablet 3  . traZODone (DESYREL) 100 MG tablet Take 1 tablet (100 mg total) by mouth at bedtime. Reported on 09/19/2015 90 tablet 1  . [DISCONTINUED] DULoxetine (CYMBALTA) 20 MG capsule Take 20 mg by mouth daily. Samples per Dr Arnoldo Morale    . [DISCONTINUED] gabapentin (NEURONTIN) 300 MG capsule Take 300  mg by mouth 2 (two) times daily. Per Dr Daylene Katayama     No current facility-administered medications on file prior to visit.     BP (!) 142/78   Temp 97.8 F (36.6 C) (Oral)   Ht _0  (1.575 m)   Wt 187 lb 14.4 oz (85.2 kg)   BMI  34.37 kg/m       Objective:   Physical Exam  Constitutional: She is oriented to person, place, and time. She appears well-developed and well-nourished. No distress.  Cardiovascular: Normal rate, regular rhythm, normal heart sounds and intact distal pulses.  Exam reveals no gallop and no friction rub.   No murmur heard. Pulmonary/Chest: Effort normal and breath sounds normal. No respiratory distress. She has no wheezes. She has no rales. She exhibits no tenderness.  Musculoskeletal: Normal range of motion. She exhibits no deformity.  Neurological: She is alert and oriented to person, place, and time.  Skin: Skin is warm and dry. No rash noted. She is not diaphoretic. No erythema. No pallor.  Psychiatric: She has a normal mood and affect. Her behavior is normal. Thought content normal.  Nursing note and vitals reviewed.      Assessment & Plan:  1. Essential hypertension - Not at goal. Will increase Ziac - bisoprolol-hydrochlorothiazide (ZIAC) 10-6.25 MG per tablet 1 tablet; Take 1 tablet by mouth daily. - Start monitoring at home.  - Follow up in two weeks  2. Type 2 diabetes mellitus without complication, without long-term current use of insulin ([place]) - Educated on the importance of diet and exercise - glucose blood (ONETOUCH VERIO) test strip; Use as instructed  Dispense: 100 each; Refill: 12 - POC HgB A1c- 6.5  Dorothyann Peng, NP

## 2016-06-27 NOTE — Telephone Encounter (Signed)
LM x 1 

## 2016-07-01 NOTE — Telephone Encounter (Signed)
Spoke with patient, aware of below recs.  States she would like to go ahead and start on cpap therapy.  Order placed.  Pt states she will call office when she receives cpap in home to schedule rov.   Nothing further needed at this time.

## 2016-07-01 NOTE — Telephone Encounter (Signed)
Pt calling [phone] back wants results

## 2016-07-03 ENCOUNTER — Encounter: Payer: Self-pay | Admitting: Adult Health

## 2016-07-03 ENCOUNTER — Ambulatory Visit (INDEPENDENT_AMBULATORY_CARE_PROVIDER_SITE_OTHER): Payer: BLUE CROSS/BLUE SHIELD | Admitting: Adult Health

## 2016-07-03 VITALS — BP 118/76 | Temp 97.7°F | Ht 62.0 in | Wt 182.2 lb

## 2016-07-03 DIAGNOSIS — I10 Essential (primary) hypertension: Secondary | ICD-10-CM | POA: Diagnosis not present

## 2016-07-03 NOTE — Progress Notes (Signed)
Subjective:    Patient ID: Alexandra [name], female    DOB: 08-18-1960, 55 y.o.   MRN: [id number]  HPI  55 year old female who presents to the office today for two week follow up regarding hypertension. She was started on Ziac. She denies any complaints today with her medications.  Today in the office her blood pressure is much improved to 118/76  Wt Readings from Last 3 Encounters:  07/03/16 182 lb 3.2 oz (82.6 kg)  06/19/16 187 lb 14.4 oz (85.2 kg)  06/12/16 183 lb (83 kg)    Review of Systems  Constitutional: Negative.   Respiratory: Negative.   Cardiovascular: Negative.   Gastrointestinal: Negative.   Neurological: Negative.   All other systems reviewed and are negative.  Past Medical History:  Diagnosis Date  . Diabetes mellitus (HCC)   . Fatty liver   . GERD (gastroesophageal reflux disease)    prn med.  . Hiatal hernia 1998  . Hyperlipidemia   . Hyperplastic colon polyp   . Hypertension    new dx. - will start med. 06/28/2011  . Hyperthyroidism   . Hypothyroidism   . Rotator cuff tear, right   . Sleep apnea    prior to gastric bypass - no sleep study since bypass    Social History   Social History  . Marital status: Married    Spouse name: N/A  . Number of children: N/A  . Years of education: N/A   Occupational History  . unemployed    Social History Main Topics  . Smoking status: Former Smoker    Packs/day: 1.00    Years: 10.00    Types: Cigarettes    Quit date: 07/29/1984  . Smokeless tobacco: Never Used  . Alcohol use Yes     Comment: occassionally  . Drug use: No  . Sexual activity: Yes   Other Topics Concern  . Not on file   Social History Narrative   Married for 24 years    Six children- all live around here. Two daughters live with her   Six grand children    On Disability for mental issues   No pets   Likes to go to movies and out to dinner. She likes to do puzzle books. Likes to read      She eats a healthy diet   She does  not exercise              Past Surgical History:  Procedure Laterality Date  . ABDOMINAL HYSTERECTOMY  06/25/2000   left salpingo-oophorectomy  . BACK SURGERY  1997   discetomy, lumbar spinal fusion  . CARDIAC CATHETERIZATION  04/01/2006  . CESAREAN SECTION  1991  . CHOLECYSTECTOMY  1989  . EXPLORATORY LAPAROTOMY  05/08/2000   right salpingo-oophorectomy  . LAMINOTOMY / EXCISION DISK POSTERIOR CERVICAL SPINE  08/10/2009   C7-T1  . 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 SHOULDER ARTHROSCOPY  01/02/2006; 05/31/2004   left 2007; right 2005  . SHOULDER ARTHROSCOPY  07/02/2011   Procedure: ARTHROSCOPY SHOULDER;  Surgeon: Wyn Forster., MD;  Location: [facility];  Service: Orthopedics;  Laterality: Right;  repair supraspinatus, Debride glenohumeral joint, labrum  . SHOULDER ARTHROSCOPY DISTAL CLAVICLE EXCISION AND OPEN ROTATOR CUFF REPAIR  07/26/2010   right  . THYROID SURGERY  early 2000s   goiter  . TUBAL LIGATION      Family History  Problem Relation Age of Onset  . Coronary artery disease    .  Cancer Paternal Grandfather     Prostate Cancer  . Diabetes Mother   . Hyperlipidemia Mother   . Hypertension Mother   . Thyroid disease Mother   . Rheum arthritis Mother   . COPD Mother     Allergies  Allergen Reactions  . Shrimp Flavor Swelling    Swelling of tongue and rash  . Ace Inhibitors Cough  . Oxycodone-Acetaminophen Rash    Current Outpatient Prescriptions on File Prior to Visit  Medication Sig Dispense Refill  . aspirin 325 MG tablet Take 1 tablet (325 mg total) by mouth daily. For heart health 30 tablet 3  . benztropine (COGENTIN) 1 MG tablet TAKE 1 TABLET TWICE A DAY FOR PREVENTION OF DRUG RELATED INVOLUNTARY MOVEMENTS 180 tablet 3  . Blood Glucose Monitoring Suppl (ONETOUCH VERIO) w/Device KIT 1 Units by Does not apply route daily. 1 kit 0  . citalopram (CELEXA) 40 MG tablet Take 1 tablet (40 mg total) by mouth daily. Reported on 09/19/2015 90  tablet 1  . glipiZIDE (GLUCOTROL) 10 MG tablet TAKE 1 TABLET TWICE A DAY BEFORE A MEAL 180 tablet 2  . glucose blood (ONETOUCH VERIO) test strip Use as instructed 100 each 12  . HYDROcodone-homatropine (HYCODAN) 5-1.5 MG/5ML syrup Take 5 mLs by mouth every 8 (eight) hours as needed for cough. 120 mL 0  . levothyroxine (SYNTHROID) 137 MCG tablet Take 1 tablet (137 mcg total) by mouth daily before breakfast. 90 tablet 2  . metFORMIN (GLUCOPHAGE) 1000 MG tablet TAKE 1 TABLET TWICE A DAY WITH A MEAL. 180 tablet 3  . metFORMIN (GLUCOPHAGE) 1000 MG tablet TAKE 1 TABLET TWICE A DAY WITH A MEAL. 30 tablet 0  . MOVANTIK 12.5 MG TABS tablet     . nitroGLYCERIN (NITROSTAT) 0.4 MG SL tablet Place 1 tablet (0.4 mg total) under the tongue every 5 (five) minutes as needed for chest pain (CP or SOB). 60 tablet 12  . OLANZapine (ZYPREXA) 20 MG tablet Take 1 tablet (20 mg total) by mouth at bedtime. 90 tablet 1  . ONE TOUCH LANCETS MISC Test blood sugars once daily Dx: E11.9 100 each 3  . oxyCODONE-acetaminophen (PERCOCET) 7.5-325 MG per tablet Take 1 tablet by mouth 4 (four) times daily.  0  . pregabalin (LYRICA) 200 MG capsule Take 200 mg by mouth 2 (two) times daily.    . simvastatin (ZOCOR) 20 MG tablet TAKE 1 TABLET AT BEDTIME FOR HIGH CHOLESTEROL CONTROL 90 tablet 3  . traZODone (DESYREL) 100 MG tablet Take 1 tablet (100 mg total) by mouth at bedtime. Reported on 09/19/2015 90 tablet 1  . [DISCONTINUED] DULoxetine (CYMBALTA) 20 MG capsule Take 20 mg by mouth daily. Samples per Dr Lovell Sheehan    . [DISCONTINUED] gabapentin (NEURONTIN) 300 MG capsule Take 300 mg by mouth 2 (two) times daily. Per Dr Teressa Senter     Current Facility-Administered Medications on File Prior to Visit  Medication Dose Route Frequency Provider Last Rate Last Dose  . bisoprolol-hydrochlorothiazide (ZIAC) 10-6.25 MG per tablet 1 tablet  1 tablet Oral Daily Merville Hijazi, NP        BP 118/76   Temp 97.7 F (36.5 C) (Oral)   Ht 5\' 2"  (1.575  m)   Wt 182 lb 3.2 oz (82.6 kg)   BMI 33.32 kg/m       Objective:   Physical Exam  Constitutional: She is oriented to person, place, and time. She appears well-developed and well-nourished. No distress.  HENT:  Head: Normocephalic and atraumatic.  Right Ear: External ear normal.  Left Ear: External ear normal.  Nose: Nose normal.  Mouth/Throat: Oropharynx is clear and moist. No oropharyngeal exudate.  Cardiovascular: Normal rate, regular rhythm, normal heart sounds and intact distal pulses.  Exam reveals no gallop and no friction rub.   No murmur heard. Pulmonary/Chest: Effort normal and breath sounds normal. No respiratory distress. She has no wheezes. She has no rales. She exhibits no tenderness.  Neurological: She is alert and oriented to person, place, and time.  Skin: Skin is warm and dry. No rash noted. She is not diaphoretic. No erythema. No pallor.  Psychiatric: She has a normal mood and affect. Her behavior is normal. Judgment and thought content normal.  Nursing note and vitals reviewed.     Assessment & Plan:  1. Essential hypertension - Well controlled at this time.  - Continue with current therapy.  - Monitor at home - Eat a heart healthy diet and exercise on a regular basis  - Follow up with in 3 months   Shirline Frees, NP

## 2016-07-03 NOTE — Patient Instructions (Signed)
Blood pressure looks great!   Keep working on diet and exercise  Follow up in 3 months for diabetes check

## 2016-07-08 ENCOUNTER — Telehealth: Payer: Self-pay | Admitting: Adult Health

## 2016-07-08 NOTE — Telephone Encounter (Signed)
° ° °  RX was sent to [facility] and it should [facility].  glucose blood (ONETOUCH VERIO) test strip

## 2016-07-09 ENCOUNTER — Other Ambulatory Visit: Payer: Self-pay

## 2016-07-09 DIAGNOSIS — E119 Type 2 diabetes mellitus without complications: Secondary | ICD-10-CM

## 2016-07-09 MED ORDER — GLUCOSE BLOOD VI STRP: ORAL_STRIP | 12 refills | Status: DC

## 2016-07-09 NOTE — Telephone Encounter (Signed)
Rx has been sent to preferred pharmacy.  

## 2016-07-15 ENCOUNTER — Telehealth: Payer: Self-pay | Admitting: Pulmonary Disease

## 2016-07-15 NOTE — Telephone Encounter (Signed)
[id number] x1 

## 2016-07-16 NOTE — Telephone Encounter (Signed)
I called [facility] & spoke to [facility]  She states they will be able to get to pt before the end of the year.  She sent e-mail to respiratory therapist to make them aware.  I called pt & left her vm letting her know order has been sent to [facility].  Nothing further needed.

## 2016-07-16 NOTE — Telephone Encounter (Signed)
Spoke with pt .  She states that [facility] does not have anymore appts this year for new cpap starts . Pt would like us to try [facility] since she has met her deductible and would like to get this started this year.  Will send to [facility] and have them see what they can do and call pt. 

## 2016-08-30 ENCOUNTER — Ambulatory Visit (INDEPENDENT_AMBULATORY_CARE_PROVIDER_SITE_OTHER): Payer: BLUE CROSS/BLUE SHIELD | Admitting: Adult Health

## 2016-08-30 ENCOUNTER — Encounter: Payer: Self-pay | Admitting: Adult Health

## 2016-08-30 VITALS — BP 112/70 | Temp 97.8°F | Ht 62.0 in | Wt 185.4 lb

## 2016-08-30 DIAGNOSIS — T148XXA Other injury of unspecified body region, initial encounter: Secondary | ICD-10-CM | POA: Diagnosis not present

## 2016-08-30 NOTE — Progress Notes (Signed)
Subjective:    Patient ID: Alexandra [name], female    DOB: [birth year], 56 y.o.   MRN: [id number]  HPI  56 year old female who  has a past medical history of Diabetes mellitus (HCC); Fatty liver; GERD (gastroesophageal reflux disease); Hiatal hernia (1998); Hyperlipidemia; Hyperplastic colon polyp; Hypertension; Hyperthyroidism; Hypothyroidism; Rotator cuff tear, right; and Sleep apnea.  She presents to the office today for an acute issue. She reports that 3 days ago he noticed blisters appearing on her right leg. These blisters appeared spontaneously. He denies any drainage from the blisters and the skin has been intact. There is slight tenderness around the edge of the blisters. He denies any trauma, calf pain, or edema.     Review of Systems   See HPI   Past Medical History:  Diagnosis Date  . Diabetes mellitus (HCC)   . Fatty liver   . GERD (gastroesophageal reflux disease)    prn med.  . Hiatal hernia 1998  . Hyperlipidemia   . Hyperplastic colon polyp   . Hypertension    new dx. - will start med. 06/28/2011  . Hyperthyroidism   . Hypothyroidism   . Rotator cuff tear, right   . Sleep apnea    prior to gastric bypass - no sleep study since bypass    Social History   Social History  . Marital status: Married    Spouse name: N/A  . Number of children: N/A  . Years of education: N/A   Occupational History  . unemployed    Social History Main Topics  . Smoking status: Former Smoker    Packs/day: 1.00    Years: 10.00    Types: Cigarettes    Quit date: 07/29/1984  . Smokeless tobacco: Never Used  . Alcohol use Yes     Comment: occassionally  . Drug use: No  . Sexual activity: Yes   Other Topics Concern  . Not on file   Social History Narrative   Married for 24 years    Six children- all live around here. Two daughters live with her   Six grand children    On Disability for mental issues   No pets   Likes to go to movies and out to dinner. She likes to  do puzzle books. Likes to read      She eats a healthy diet   She does not exercise              Past Surgical History:  Procedure Laterality Date  . ABDOMINAL HYSTERECTOMY  06/25/2000   left salpingo-oophorectomy  . BACK SURGERY  1997   discetomy, lumbar spinal fusion  . CARDIAC CATHETERIZATION  04/01/2006  . CESAREAN SECTION  1991  . CHOLECYSTECTOMY  1989  . EXPLORATORY LAPAROTOMY  05/08/2000   right salpingo-oophorectomy  . LAMINOTOMY / EXCISION DISK POSTERIOR CERVICAL SPINE  08/10/2009   C7-T1  . 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 SHOULDER ARTHROSCOPY  01/02/2006; 05/31/2004   left 2007; right 2005  . SHOULDER ARTHROSCOPY  07/02/2011   Procedure: ARTHROSCOPY SHOULDER;  Surgeon: Wyn Forster., MD;  Location: [facility];  Service: Orthopedics;  Laterality: Right;  repair supraspinatus, Debride glenohumeral joint, labrum  . SHOULDER ARTHROSCOPY DISTAL CLAVICLE EXCISION AND OPEN ROTATOR CUFF REPAIR  07/26/2010   right  . THYROID SURGERY  early 2000s   goiter  . TUBAL LIGATION      Family History  Problem Relation Age of Onset  . Coronary artery disease    .  Cancer Paternal Grandfather     Prostate Cancer  . Diabetes Mother   . Hyperlipidemia Mother   . Hypertension Mother   . Thyroid disease Mother   . Rheum arthritis Mother   . COPD Mother     Allergies  Allergen Reactions  . Shrimp Flavor Swelling    Swelling of tongue and rash  . Ace Inhibitors Cough  . Oxycodone-Acetaminophen Rash    Current Outpatient Prescriptions on File Prior to Visit  Medication Sig Dispense Refill  . aspirin 325 MG tablet Take 1 tablet (325 mg total) by mouth daily. For heart health 30 tablet 3  . benztropine (COGENTIN) 1 MG tablet TAKE 1 TABLET TWICE A DAY FOR PREVENTION OF DRUG RELATED INVOLUNTARY MOVEMENTS 180 tablet 3  . Blood Glucose Monitoring Suppl (ONETOUCH VERIO) w/Device KIT 1 Units by Does not apply route daily. 1 kit 0  . citalopram (CELEXA) 40 MG  tablet Take 1 tablet (40 mg total) by mouth daily. Reported on 09/19/2015 90 tablet 1  . glipiZIDE (GLUCOTROL) 10 MG tablet TAKE 1 TABLET TWICE A DAY BEFORE A MEAL 180 tablet 2  . glucose blood (ONETOUCH VERIO) test strip Use as instructed 100 each 12  . HYDROcodone-homatropine (HYCODAN) 5-1.5 MG/5ML syrup Take 5 mLs by mouth every 8 (eight) hours as needed for cough. 120 mL 0  . levothyroxine (SYNTHROID) 137 MCG tablet Take 1 tablet (137 mcg total) by mouth daily before breakfast. 90 tablet 2  . metFORMIN (GLUCOPHAGE) 1000 MG tablet TAKE 1 TABLET TWICE A DAY WITH A MEAL. 180 tablet 3  . metFORMIN (GLUCOPHAGE) 1000 MG tablet TAKE 1 TABLET TWICE A DAY WITH A MEAL. 30 tablet 0  . MOVANTIK 12.5 MG TABS tablet     . nitroGLYCERIN (NITROSTAT) 0.4 MG SL tablet Place 1 tablet (0.4 mg total) under the tongue every 5 (five) minutes as needed for chest pain (CP or SOB). 60 tablet 12  . OLANZapine (ZYPREXA) 20 MG tablet Take 1 tablet (20 mg total) by mouth at bedtime. 90 tablet 1  . ONE TOUCH LANCETS MISC Test blood sugars once daily Dx: E11.9 100 each 3  . oxyCODONE-acetaminophen (PERCOCET) 7.5-325 MG per tablet Take 1 tablet by mouth 4 (four) times daily.  0  . pregabalin (LYRICA) 200 MG capsule Take 200 mg by mouth 2 (two) times daily.    . simvastatin (ZOCOR) 20 MG tablet TAKE 1 TABLET AT BEDTIME FOR HIGH CHOLESTEROL CONTROL 90 tablet 3  . traZODone (DESYREL) 100 MG tablet Take 1 tablet (100 mg total) by mouth at bedtime. Reported on 09/19/2015 90 tablet 1  . [DISCONTINUED] DULoxetine (CYMBALTA) 20 MG capsule Take 20 mg by mouth daily. Samples per Dr Lovell Sheehan    . [DISCONTINUED] gabapentin (NEURONTIN) 300 MG capsule Take 300 mg by mouth 2 (two) times daily. Per Dr Teressa Senter     Current Facility-Administered Medications on File Prior to Visit  Medication Dose Route Frequency Provider Last Rate Last Dose  . bisoprolol-hydrochlorothiazide (ZIAC) 10-6.25 MG per tablet 1 tablet  1 tablet Oral Daily Valdemar Mcclenahan,  NP        BP 112/70   Temp 97.8 F (36.6 C) (Oral)   Ht 5\' 2"  (1.575 m)   Wt 185 lb 6.4 oz (84.1 kg)   BMI 33.91 kg/m       Objective:   Physical Exam  Constitutional: She appears well-developed and well-nourished. No distress.  Skin: Skin is warm and dry. She is not diaphoretic.  3 blisters noted  on right leg with the largest being about 1 inch in diameter. There is no warmth, or drainage noted. Redness noted along the posterior border. Slight tenderness with palpation  Psychiatric: She has a normal mood and affect. Her behavior is normal. Judgment and thought content normal.  Nursing note and vitals reviewed.     Assessment & Plan:  1. Blister Appears as bullous diabeticorum.  - Sized her that these blister should resolve within 2 weeks. She can use Neosporin across the blisters. Advised not to pop blisters if they pop to keep Neosporin applied as well as dry bandage. - Follow up if blisters continue or if any active signs of infection - Consider referral to dermatology   Shirline Frees, NP

## 2016-09-16 ENCOUNTER — Encounter: Payer: Self-pay | Admitting: Adult Health

## 2016-10-01 ENCOUNTER — Encounter: Payer: Self-pay | Admitting: Adult Health

## 2016-10-01 ENCOUNTER — Ambulatory Visit (INDEPENDENT_AMBULATORY_CARE_PROVIDER_SITE_OTHER): Payer: BLUE CROSS/BLUE SHIELD | Admitting: Adult Health

## 2016-10-01 VITALS — BP 130/78 | Temp 98.0°F | Ht 62.0 in | Wt 185.0 lb

## 2016-10-01 DIAGNOSIS — I10 Essential (primary) hypertension: Secondary | ICD-10-CM | POA: Diagnosis not present

## 2016-10-01 DIAGNOSIS — E119 Type 2 diabetes mellitus without complications: Secondary | ICD-10-CM

## 2016-10-01 LAB — POCT GLYCOSYLATED HEMOGLOBIN (HGB A1C): HEMOGLOBIN A1C: 6

## 2016-10-01 MED ORDER — DOXYCYCLINE HYCLATE 100 MG PO CAPS: 100.0000 mg | ORAL_CAPSULE | Freq: Two times a day (BID) | ORAL | 0 refills | Status: DC

## 2016-10-01 NOTE — Progress Notes (Signed)
Subjective:    Patient ID: Alexandra [name], female    DOB: 07/03/1961, 56 y.o.   MRN: [id number]  HPI  56 year old female who  has a past medical history of Diabetes mellitus (HCC); Fatty liver; GERD (gastroesophageal reflux disease); Hiatal hernia (1998); Hyperlipidemia; Hyperplastic colon polyp; Hypertension; Hyperthyroidism; Hypothyroidism; Rotator cuff tear, right; and Sleep apnea.   She presents to the office today for three month follow up regarding hypertension and diabetes. She is currently taking Glipizide 10 mg and Metformin 1000 mg BID. Her last A1c was 6.5. She reports that " my blood sugars have been high." She is checking them three times per day and has had several days in the 200.   She is taking Ziac 10-6.25 for essential hypertension.    Review of Systems See HPI   Past Medical History:  Diagnosis Date  . Diabetes mellitus (HCC)   . Fatty liver   . GERD (gastroesophageal reflux disease)    prn med.  . Hiatal hernia 1998  . Hyperlipidemia   . Hyperplastic colon polyp   . Hypertension    new dx. - will start med. 06/28/2011  . Hyperthyroidism   . Hypothyroidism   . Rotator cuff tear, right   . Sleep apnea    prior to gastric bypass - no sleep study since bypass    Social History   Social History  . Marital status: Married    Spouse name: N/A  . Number of children: N/A  . Years of education: N/A   Occupational History  . unemployed    Social History Main Topics  . Smoking status: Former Smoker    Packs/day: 1.00    Years: 10.00    Types: Cigarettes    Quit date: 07/29/1984  . Smokeless tobacco: Never Used  . Alcohol use Yes     Comment: occassionally  . Drug use: No  . Sexual activity: Yes   Other Topics Concern  . Not on file   Social History Narrative   Married for 24 years    Six children- all live around here. Two daughters live with her   Six grand children    On Disability for mental issues   No pets   Likes to go to movies and  out to dinner. She likes to do puzzle books. Likes to read      She eats a healthy diet   She does not exercise              Past Surgical History:  Procedure Laterality Date  . ABDOMINAL HYSTERECTOMY  06/25/2000   left salpingo-oophorectomy  . BACK SURGERY  1997   discetomy, lumbar spinal fusion  . CARDIAC CATHETERIZATION  04/01/2006  . CESAREAN SECTION  1991  . CHOLECYSTECTOMY  1989  . EXPLORATORY LAPAROTOMY  05/08/2000   right salpingo-oophorectomy  . LAMINOTOMY / EXCISION DISK POSTERIOR CERVICAL SPINE  08/10/2009   C7-T1  . 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 SHOULDER ARTHROSCOPY  01/02/2006; 05/31/2004   left 2007; right 2005  . SHOULDER ARTHROSCOPY  07/02/2011   Procedure: ARTHROSCOPY SHOULDER;  Surgeon: Wyn Forster., MD;  Location: [facility];  Service: Orthopedics;  Laterality: Right;  repair supraspinatus, Debride glenohumeral joint, labrum  . SHOULDER ARTHROSCOPY DISTAL CLAVICLE EXCISION AND OPEN ROTATOR CUFF REPAIR  07/26/2010   right  . THYROID SURGERY  early 2000s   goiter  . TUBAL LIGATION      Family History  Problem  Relation Age of Onset  . Coronary artery disease    . Cancer Paternal Grandfather     Prostate Cancer  . Diabetes Mother   . Hyperlipidemia Mother   . Hypertension Mother   . Thyroid disease Mother   . Rheum arthritis Mother   . COPD Mother     Allergies  Allergen Reactions  . Shrimp Flavor Swelling    Swelling of tongue and rash  . Ace Inhibitors Cough  . Oxycodone-Acetaminophen Rash    Current Outpatient Prescriptions on File Prior to Visit  Medication Sig Dispense Refill  . aspirin 325 MG tablet Take 1 tablet (325 mg total) by mouth daily. For heart health 30 tablet 3  . benztropine (COGENTIN) 1 MG tablet TAKE 1 TABLET TWICE A DAY FOR PREVENTION OF DRUG RELATED INVOLUNTARY MOVEMENTS 180 tablet 3  . Blood Glucose Monitoring Suppl (ONETOUCH VERIO) w/Device KIT 1 Units by Does not apply route daily. 1 kit 0  .  citalopram (CELEXA) 40 MG tablet Take 1 tablet (40 mg total) by mouth daily. Reported on 09/19/2015 90 tablet 1  . glipiZIDE (GLUCOTROL) 10 MG tablet TAKE 1 TABLET TWICE A DAY BEFORE A MEAL 180 tablet 2  . glucose blood (ONETOUCH VERIO) test strip Use as instructed 100 each 12  . levothyroxine (SYNTHROID) 137 MCG tablet Take 1 tablet (137 mcg total) by mouth daily before breakfast. 90 tablet 2  . metFORMIN (GLUCOPHAGE) 1000 MG tablet TAKE 1 TABLET TWICE A DAY WITH A MEAL. 180 tablet 3  . MOVANTIK 12.5 MG TABS tablet     . nitroGLYCERIN (NITROSTAT) 0.4 MG SL tablet Place 1 tablet (0.4 mg total) under the tongue every 5 (five) minutes as needed for chest pain (CP or SOB). 60 tablet 12  . OLANZapine (ZYPREXA) 20 MG tablet Take 1 tablet (20 mg total) by mouth at bedtime. 90 tablet 1  . ONE TOUCH LANCETS MISC Test blood sugars once daily Dx: E11.9 100 each 3  . oxyCODONE-acetaminophen (PERCOCET) 7.5-325 MG per tablet Take 1 tablet by mouth 4 (four) times daily.  0  . pregabalin (LYRICA) 200 MG capsule Take 200 mg by mouth 2 (two) times daily.    . simvastatin (ZOCOR) 20 MG tablet TAKE 1 TABLET AT BEDTIME FOR HIGH CHOLESTEROL CONTROL 90 tablet 3  . traZODone (DESYREL) 100 MG tablet Take 1 tablet (100 mg total) by mouth at bedtime. Reported on 09/19/2015 90 tablet 1  . [DISCONTINUED] DULoxetine (CYMBALTA) 20 MG capsule Take 20 mg by mouth daily. Samples per Dr Lovell Sheehan    . [DISCONTINUED] gabapentin (NEURONTIN) 300 MG capsule Take 300 mg by mouth 2 (two) times daily. Per Dr Teressa Senter     Current Facility-Administered Medications on File Prior to Visit  Medication Dose Route Frequency Provider Last Rate Last Dose  . bisoprolol-hydrochlorothiazide (ZIAC) 10-6.25 MG per tablet 1 tablet  1 tablet Oral Daily Tyliah Schlereth, NP        BP 130/78   Temp 98 F (36.7 C) (Oral)   Ht 5\' 2"  (1.575 m)   Wt 185 lb (83.9 kg)   BMI 33.84 kg/m       Objective:   Physical Exam  Constitutional: She is oriented to  person, place, and time. She appears well-developed and well-nourished. No distress.  Cardiovascular: Normal rate, regular rhythm, normal heart sounds and intact distal pulses.  Exam reveals no gallop and no friction rub.   No murmur heard. Pulmonary/Chest: Effort normal and breath sounds normal. No respiratory distress. She  has no wheezes. She has no rales.  Neurological: She is alert and oriented to person, place, and time.  Skin: Skin is warm and dry. She is not diaphoretic.  Well healing wounds on right leg from previous leg blisters   Psychiatric: She has a normal mood and affect. Her behavior is normal. Judgment and thought content normal.  Nursing note and vitals reviewed.      Assessment & Plan:  1. Essential hypertension - Near goal. No change in medications  2. Type 2 diabetes mellitus without complication, without long-term current use of insulin (HCC) - POC A1c - 6.0  - No change in medications - Encouraged diabetic diet and frequent exercise - Bring monitor to next appointment   Shirline Frees, NP

## 2016-11-08 ENCOUNTER — Encounter: Payer: Self-pay | Admitting: Adult Health

## 2016-11-08 ENCOUNTER — Ambulatory Visit (INDEPENDENT_AMBULATORY_CARE_PROVIDER_SITE_OTHER): Payer: BLUE CROSS/BLUE SHIELD | Admitting: Adult Health

## 2016-11-08 ENCOUNTER — Ambulatory Visit (INDEPENDENT_AMBULATORY_CARE_PROVIDER_SITE_OTHER): Payer: BLUE CROSS/BLUE SHIELD

## 2016-11-08 VITALS — BP 122/80 | Temp 98.0°F | Ht 62.0 in | Wt 186.6 lb

## 2016-11-08 DIAGNOSIS — R0602 Shortness of breath: Secondary | ICD-10-CM

## 2016-11-08 DIAGNOSIS — Z760 Encounter for issue of repeat prescription: Secondary | ICD-10-CM

## 2016-11-08 DIAGNOSIS — E119 Type 2 diabetes mellitus without complications: Secondary | ICD-10-CM

## 2016-11-08 DIAGNOSIS — R5383 Other fatigue: Secondary | ICD-10-CM

## 2016-11-08 LAB — POC URINALSYSI DIPSTICK (AUTOMATED)
BILIRUBIN UA: NEGATIVE
Blood, UA: NEGATIVE
Glucose, UA: NEGATIVE
KETONES UA: NEGATIVE
LEUKOCYTES UA: NEGATIVE
Nitrite, UA: NEGATIVE
Protein, UA: NEGATIVE
Spec Grav, UA: 1.01 (ref 1.010–1.025)
UROBILINOGEN UA: 0.2 U/dL
pH, UA: 6 (ref 5.0–8.0)

## 2016-11-08 LAB — TSH: TSH: 1.39 u[IU]/mL (ref 0.35–4.50)

## 2016-11-08 LAB — CBC WITH DIFFERENTIAL/PLATELET
BASOS PCT: 0.4 % (ref 0.0–3.0)
Basophils Absolute: 0 10*3/uL (ref 0.0–0.1)
EOS PCT: 2.9 % (ref 0.0–5.0)
Eosinophils Absolute: 0.3 10*3/uL (ref 0.0–0.7)
HEMATOCRIT: 37.7 % (ref 36.0–46.0)
HEMOGLOBIN: 12.2 g/dL (ref 12.0–15.0)
LYMPHS PCT: 35.3 % (ref 12.0–46.0)
Lymphs Abs: 3.9 10*3/uL (ref 0.7–4.0)
MCHC: 32.4 g/dL (ref 30.0–36.0)
MCV: 90.6 fl (ref 78.0–100.0)
Monocytes Absolute: 0.8 10*3/uL (ref 0.1–1.0)
Monocytes Relative: 7.4 % (ref 3.0–12.0)
Neutro Abs: 5.9 10*3/uL (ref 1.4–7.7)
Neutrophils Relative %: 54 % (ref 43.0–77.0)
Platelets: 396 10*3/uL (ref 150.0–400.0)
RBC: 4.16 Mil/uL (ref 3.87–5.11)
RDW: 14.9 % (ref 11.5–15.5)
WBC: 11 10*3/uL — AB (ref 4.0–10.5)

## 2016-11-08 LAB — BASIC METABOLIC PANEL
BUN: 6 mg/dL (ref 6–23)
CHLORIDE: 101 meq/L (ref 96–112)
CO2: 30 mEq/L (ref 19–32)
Calcium: 9.2 mg/dL (ref 8.4–10.5)
Creatinine, Ser: 0.63 mg/dL (ref 0.40–1.20)
GFR: 125.42 mL/min (ref >60.00)
Glucose, Bld: 183 mg/dL — ABNORMAL HIGH (ref 70–99)
POTASSIUM: 3.8 meq/L (ref 3.5–5.1)
SODIUM: 141 meq/L (ref 135–145)

## 2016-11-08 LAB — POCT GLYCOSYLATED HEMOGLOBIN (HGB A1C): HEMOGLOBIN A1C: 6.8

## 2016-11-08 LAB — C-REACTIVE PROTEIN: CRP: 2 mg/dL (ref 0.5–20.0)

## 2016-11-08 LAB — SEDIMENTATION RATE: SED RATE: 25 mm/h (ref 0–30)

## 2016-11-08 MED ORDER — PREGABALIN 200 MG PO CAPS: 200.0000 mg | ORAL_CAPSULE | Freq: Two times a day (BID) | ORAL | 0 refills | Status: DC

## 2016-11-08 NOTE — Progress Notes (Signed)
Subjective:    Patient ID: Alexandra [name], female    DOB: September 09, 1960, 56 y.o.   MRN: [id number]  HPI  56 year old female who  has a past medical history of Diabetes mellitus (HCC); Fatty liver; GERD (gastroesophageal reflux disease); Hiatal hernia (1998); Hyperlipidemia; Hyperplastic colon polyp; Hypertension; Hyperthyroidism; Hypothyroidism; Rotator cuff tear, right; and Sleep apnea. She presents to the clinic today with the complaint of elevated blood sugars. She reports that at home her blood sugars have been greater than 250 on multiple separate occassions. Her glucometer shows that this is true   This morning when she woke up and checked it her glucose 353 - after she had a bowl of cereal for breakfast.   She also reports decreased appetite and fatigue over the last two months. She feels as though she is urinating more often but does not have any dysuria, hematuria, or urgency   She denies any CP but does have shortness of breath on exertion - present for 4 months.   Her diet has not changed and she is drinking 3-4 bottles of water per day   She denies fever, n/v/d   She also cannot afford to go to pain management for her neuropathy. She is taking Lyrica and would like me to take over this care  Review of Systems See HPI   Past Medical History:  Diagnosis Date  . Diabetes mellitus (HCC)   . Fatty liver   . GERD (gastroesophageal reflux disease)    prn med.  . Hiatal hernia 1998  . Hyperlipidemia   . Hyperplastic colon polyp   . Hypertension    new dx. - will start med. 06/28/2011  . Hyperthyroidism   . Hypothyroidism   . Rotator cuff tear, right   . Sleep apnea    prior to gastric bypass - no sleep study since bypass    Social History   Social History  . Marital status: Married    Spouse name: N/A  . Number of children: N/A  . Years of education: N/A   Occupational History  . unemployed    Social History Main Topics  . Smoking status: Former Smoker   Packs/day: 1.00    Years: 10.00    Types: Cigarettes    Quit date: 07/29/1984  . Smokeless tobacco: Never Used  . Alcohol use Yes     Comment: occassionally  . Drug use: No  . Sexual activity: Yes   Other Topics Concern  . Not on file   Social History Narrative   Married for 24 years    Six children- all live around here. Two daughters live with her   Six grand children    On Disability for mental issues   No pets   Likes to go to movies and out to dinner. She likes to do puzzle books. Likes to read      She eats a healthy diet   She does not exercise              Past Surgical History:  Procedure Laterality Date  . ABDOMINAL HYSTERECTOMY  06/25/2000   left salpingo-oophorectomy  . BACK SURGERY  1997   discetomy, lumbar spinal fusion  . CARDIAC CATHETERIZATION  04/01/2006  . CESAREAN SECTION  1991  . CHOLECYSTECTOMY  1989  . EXPLORATORY LAPAROTOMY  05/08/2000   right salpingo-oophorectomy  . LAMINOTOMY / EXCISION DISK POSTERIOR CERVICAL SPINE  08/10/2009   C7-T1  . 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  SHOULDER ARTHROSCOPY  01/02/2006; 05/31/2004   left 2007; right 2005  . SHOULDER ARTHROSCOPY  07/02/2011   Procedure: ARTHROSCOPY SHOULDER;  Surgeon: Wyn Forster., MD;  Location: [facility];  Service: Orthopedics;  Laterality: Right;  repair supraspinatus, Debride glenohumeral joint, labrum  . SHOULDER ARTHROSCOPY DISTAL CLAVICLE EXCISION AND OPEN ROTATOR CUFF REPAIR  07/26/2010   right  . THYROID SURGERY  early 2000s   goiter  . TUBAL LIGATION      Family History  Problem Relation Age of Onset  . Coronary artery disease    . Cancer Paternal Grandfather     Prostate Cancer  . Diabetes Mother   . Hyperlipidemia Mother   . Hypertension Mother   . Thyroid disease Mother   . Rheum arthritis Mother   . COPD Mother     Allergies  Allergen Reactions  . Shrimp Flavor Swelling    Swelling of tongue and rash  . Ace Inhibitors Cough  .  Oxycodone-Acetaminophen Rash    Current Outpatient Prescriptions on File Prior to Visit  Medication Sig Dispense Refill  . aspirin 325 MG tablet Take 1 tablet (325 mg total) by mouth daily. For heart health 30 tablet 3  . benztropine (COGENTIN) 1 MG tablet TAKE 1 TABLET TWICE A DAY FOR PREVENTION OF DRUG RELATED INVOLUNTARY MOVEMENTS 180 tablet 3  . Blood Glucose Monitoring Suppl (ONETOUCH VERIO) w/Device KIT 1 Units by Does not apply route daily. 1 kit 0  . citalopram (CELEXA) 40 MG tablet Take 1 tablet (40 mg total) by mouth daily. Reported on 09/19/2015 90 tablet 1  . glipiZIDE (GLUCOTROL) 10 MG tablet TAKE 1 TABLET TWICE A DAY BEFORE A MEAL 180 tablet 2  . glucose blood (ONETOUCH VERIO) test strip Use as instructed 100 each 12  . levothyroxine (SYNTHROID) 137 MCG tablet Take 1 tablet (137 mcg total) by mouth daily before breakfast. 90 tablet 2  . metFORMIN (GLUCOPHAGE) 1000 MG tablet TAKE 1 TABLET TWICE A DAY WITH A MEAL. 180 tablet 3  . nitroGLYCERIN (NITROSTAT) 0.4 MG SL tablet Place 1 tablet (0.4 mg total) under the tongue every 5 (five) minutes as needed for chest pain (CP or SOB). 60 tablet 12  . OLANZapine (ZYPREXA) 20 MG tablet Take 1 tablet (20 mg total) by mouth at bedtime. 90 tablet 1  . ONE TOUCH LANCETS MISC Test blood sugars once daily Dx: E11.9 100 each 3  . simvastatin (ZOCOR) 20 MG tablet TAKE 1 TABLET AT BEDTIME FOR HIGH CHOLESTEROL CONTROL 90 tablet 3  . traZODone (DESYREL) 100 MG tablet Take 1 tablet (100 mg total) by mouth at bedtime. Reported on 09/19/2015 90 tablet 1  . [DISCONTINUED] DULoxetine (CYMBALTA) 20 MG capsule Take 20 mg by mouth daily. Samples per Dr Lovell Sheehan    . [DISCONTINUED] gabapentin (NEURONTIN) 300 MG capsule Take 300 mg by mouth 2 (two) times daily. Per Dr Teressa Senter     Current Facility-Administered Medications on File Prior to Visit  Medication Dose Route Frequency Provider Last Rate Last Dose  . bisoprolol-hydrochlorothiazide (ZIAC) 10-6.25 MG per  tablet 1 tablet  1 tablet Oral Daily Vikkie Goeden, NP        BP 122/80 (BP Location: Left Arm, Patient Position: Sitting, Cuff Size: Normal)   Temp 98 F (36.7 C) (Oral)   Ht 5\' 2"  (1.575 m)   Wt 186 lb 9.6 oz (84.6 kg)   BMI 34.13 kg/m       Objective:   Physical Exam  Constitutional:  She is oriented to person, place, and time. She appears well-developed and well-nourished. No distress.  Neck: Normal range of motion. Neck supple.  Cardiovascular: Normal rate, regular rhythm, normal heart sounds and intact distal pulses.  Exam reveals no gallop.   No murmur heard. Pulmonary/Chest: Effort normal and breath sounds normal. No respiratory distress. She has no wheezes. She has no rales. She exhibits no tenderness.  Lymphadenopathy:    She has no cervical adenopathy.  Neurological: She is alert and oriented to person, place, and time.  Skin: Skin is warm and dry. No rash noted. She is not diaphoretic. No erythema. No pallor.  Psychiatric: She has a normal mood and affect. Her behavior is normal. Judgment and thought content normal.  Nursing note and vitals reviewed.     Assessment & Plan:  1. Type 2 diabetes mellitus without complication, without long-term current use of insulin (HCC) - Need to consider infection causing her blood sugars to go up.  - We checked her blood sugars on her meter and our meter.  Hers: 176 Ours: 211 - Basic metabolic panel - POCT Urinalysis Dipstick (Automated) - POC HgB A1c - 6.8. This is up from 6.0 6 weeks ago.   2. SOB (shortness of breath) - DG Chest 2 View; Future - Consider stress test  3. Fatigue, unspecified type - Related to elevated blood sugars? Or infection? - Basic metabolic panel - CBC with Differential/Platelet - POCT Urinalysis Dipstick (Automated) - TSH - C-reactive Protein - Sedimentation Rate  4. Medication refill  - pregabalin (LYRICA) 200 MG capsule; Take 1 capsule (200 mg total) by mouth 2 (two) times daily.  Dispense:  60 capsule; Refill: 0   Shirline Frees, NP

## 2016-11-08 NOTE — Patient Instructions (Signed)
WE NOW OFFER   Alexandra [name]'s FAST TRACK!!!  SAME DAY Appointments for ACUTE CARE  Such as: Sprains, Injuries, cuts, abrasions, rashes, muscle pain, joint pain, back pain Colds, flu, sore throats, headache, allergies, cough, fever  Ear pain, sinus and eye infections Abdominal pain, nausea, vomiting, diarrhea, upset stomach Animal/insect bites  3 Easy Ways to Schedule: Walk-In Scheduling Call in scheduling Mychart Sign-up: [email or web address]         

## 2016-11-12 ENCOUNTER — Other Ambulatory Visit: Payer: Self-pay | Admitting: Adult Health

## 2016-11-12 MED ORDER — GLIPIZIDE 10 MG PO TABS: ORAL_TABLET | ORAL | 0 refills | Status: DC

## 2016-11-12 MED ORDER — CANAGLIFLOZIN 100 MG PO TABS: 100.0000 mg | ORAL_TABLET | Freq: Every day | ORAL | 3 refills | Status: DC

## 2016-11-12 MED ORDER — GLIPIZIDE 10 MG PO TABS: ORAL_TABLET | ORAL | 3 refills | Status: DC

## 2016-11-12 NOTE — Progress Notes (Signed)
Updated patient on her labs and x ray. I am going to start her on Invokana 100mg    She also reports that she needs refills of Glipizide.

## 2016-12-02 ENCOUNTER — Other Ambulatory Visit: Payer: Self-pay | Admitting: Adult Health

## 2016-12-02 DIAGNOSIS — E118 Type 2 diabetes mellitus with unspecified complications: Secondary | ICD-10-CM

## 2016-12-02 DIAGNOSIS — Z760 Encounter for issue of repeat prescription: Secondary | ICD-10-CM

## 2016-12-02 NOTE — Telephone Encounter (Signed)
Ok to refill for one year  

## 2016-12-19 ENCOUNTER — Other Ambulatory Visit: Payer: Self-pay | Admitting: Adult Health

## 2016-12-19 DIAGNOSIS — Z760 Encounter for issue of repeat prescription: Secondary | ICD-10-CM

## 2016-12-19 NOTE — Telephone Encounter (Signed)
Ok to refill for one year  

## 2016-12-20 NOTE — Telephone Encounter (Signed)
Rx has been called in as directed.  

## 2016-12-20 NOTE — Telephone Encounter (Signed)
Ok to refill for one month  

## 2016-12-22 ENCOUNTER — Encounter: Payer: Self-pay | Admitting: Adult Health

## 2016-12-24 ENCOUNTER — Other Ambulatory Visit: Payer: Self-pay

## 2016-12-24 DIAGNOSIS — Z760 Encounter for issue of repeat prescription: Secondary | ICD-10-CM

## 2016-12-24 DIAGNOSIS — E7800 Pure hypercholesterolemia, unspecified: Secondary | ICD-10-CM

## 2016-12-24 DIAGNOSIS — E118 Type 2 diabetes mellitus with unspecified complications: Secondary | ICD-10-CM

## 2016-12-24 MED ORDER — SIMVASTATIN 20 MG PO TABS: ORAL_TABLET | ORAL | 3 refills | Status: DC

## 2016-12-24 MED ORDER — METFORMIN HCL 1000 MG PO TABS: ORAL_TABLET | ORAL | 3 refills | Status: DC

## 2016-12-24 MED ORDER — LEVOTHYROXINE SODIUM 137 MCG PO TABS: 137.0000 ug | ORAL_TABLET | Freq: Every day | ORAL | 2 refills | Status: DC

## 2017-01-25 ENCOUNTER — Other Ambulatory Visit: Payer: Self-pay | Admitting: Adult Health

## 2017-01-25 DIAGNOSIS — Z760 Encounter for issue of repeat prescription: Secondary | ICD-10-CM

## 2017-01-27 MED ORDER — PREGABALIN 200 MG PO CAPS: 200.0000 mg | ORAL_CAPSULE | Freq: Two times a day (BID) | ORAL | 0 refills | Status: DC

## 2017-01-27 NOTE — Telephone Encounter (Signed)
Rx phoned in.   

## 2017-01-27 NOTE — Telephone Encounter (Signed)
Ok to refill for 30 days  

## 2017-02-13 ENCOUNTER — Encounter: Payer: Self-pay | Admitting: Adult Health

## 2017-02-13 ENCOUNTER — Ambulatory Visit (INDEPENDENT_AMBULATORY_CARE_PROVIDER_SITE_OTHER): Payer: BLUE CROSS/BLUE SHIELD | Admitting: Adult Health

## 2017-02-13 VITALS — BP 110/78 | HR 82 | Temp 98.0°F | Wt 184.5 lb

## 2017-02-13 DIAGNOSIS — B373 Candidiasis of vulva and vagina: Secondary | ICD-10-CM

## 2017-02-13 DIAGNOSIS — B3731 Acute candidiasis of vulva and vagina: Secondary | ICD-10-CM

## 2017-02-13 MED ORDER — FLUCONAZOLE 150 MG PO TABS: 150.0000 mg | ORAL_TABLET | Freq: Once | ORAL | 1 refills | Status: AC

## 2017-02-13 NOTE — Progress Notes (Signed)
Subjective:    Patient ID: Alexandra [name], female    DOB: 12-24-1960, 56 y.o.   MRN: [id number]  HPI 56 year old female who  has a past medical history of Diabetes mellitus ([place]); Fatty liver; GERD (gastroesophageal reflux disease); Hiatal hernia (1998); Hyperlipidemia; Hyperplastic colon polyp; Hypertension; Hyperthyroidism; Hypothyroidism; Rotator cuff tear, right; and Sleep apnea. She presents to the office today for two weeks of vaginal burning and itching.   She denies any vaginal drainage. Has not had any fevers.   She tried OTC Monistat which helped with her itching but her symptoms returned.   Review of Systems See HPI   Past Medical History:  Diagnosis Date  . Diabetes mellitus ([place])   . Fatty liver   . GERD (gastroesophageal reflux disease)    prn med.  . Hiatal hernia 1998  . Hyperlipidemia   . Hyperplastic colon polyp   . Hypertension    new dx. - will start med. 06/28/2011  . Hyperthyroidism   . Hypothyroidism   . Rotator cuff tear, right   . Sleep apnea    prior to gastric bypass - no sleep study since bypass    Social History   Social History  . Marital status: Married    Spouse name: N/A  . Number of children: N/A  . Years of education: N/A   Occupational History  . unemployed    Social History Main Topics  . Smoking status: Former Smoker    Packs/day: 1.00    Years: 10.00    Types: Cigarettes    Quit date: 07/29/1984  . Smokeless tobacco: Never Used  . Alcohol use Yes     Comment: occassionally  . Drug use: No  . Sexual activity: Yes   Other Topics Concern  . Not on file   Social History Narrative   Married for 24 years    Six children- all live around here. Two daughters live with her   Six grand children    On Disability for mental issues   No pets   Likes to go to movies and out to dinner. She likes to do puzzle books. Likes to read      She eats a healthy diet   She does not exercise              Past Surgical History:    Procedure Laterality Date  . ABDOMINAL HYSTERECTOMY  06/25/2000   left salpingo-oophorectomy  . BACK SURGERY  1997   discetomy, lumbar spinal fusion  . CARDIAC CATHETERIZATION  04/01/2006  . CESAREAN SECTION  1991  . CHOLECYSTECTOMY  1989  . EXPLORATORY LAPAROTOMY  05/08/2000   right salpingo-oophorectomy  . LAMINOTOMY / EXCISION DISK POSTERIOR CERVICAL SPINE  08/10/2009   C7-T1  . 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 SHOULDER ARTHROSCOPY  01/02/2006; 05/31/2004   left 2007; right 2005  . SHOULDER ARTHROSCOPY  07/02/2011   Procedure: ARTHROSCOPY SHOULDER;  Surgeon: Cammie Sickle., MD;  Location: [facility];  Service: Orthopedics;  Laterality: Right;  repair supraspinatus, Debride glenohumeral joint, labrum  . SHOULDER ARTHROSCOPY DISTAL CLAVICLE EXCISION AND OPEN ROTATOR CUFF REPAIR  07/26/2010   right  . THYROID SURGERY  early 2000s   goiter  . TUBAL LIGATION      Family History  Problem Relation Age of Onset  . Coronary artery disease Unknown   . Cancer Paternal Grandfather        Prostate Cancer  . Diabetes Mother   .  Subjective:    Patient ID: Alexandra [name], female    DOB: 12-24-1960, 56 y.o.   MRN: [id number]  HPI 56 year old female who  has a past medical history of Diabetes mellitus ([place]); Fatty liver; GERD (gastroesophageal reflux disease); Hiatal hernia (1998); Hyperlipidemia; Hyperplastic colon polyp; Hypertension; Hyperthyroidism; Hypothyroidism; Rotator cuff tear, right; and Sleep apnea. She presents to the office today for two weeks of vaginal burning and itching.   She denies any vaginal drainage. Has not had any fevers.   She tried OTC Monistat which helped with her itching but her symptoms returned.   Review of Systems See HPI   Past Medical History:  Diagnosis Date  . Diabetes mellitus ([place])   . Fatty liver   . GERD (gastroesophageal reflux disease)    prn med.  . Hiatal hernia 1998  . Hyperlipidemia   . Hyperplastic colon polyp   . Hypertension    new dx. - will start med. 06/28/2011  . Hyperthyroidism   . Hypothyroidism   . Rotator cuff tear, right   . Sleep apnea    prior to gastric bypass - no sleep study since bypass    Social History   Social History  . Marital status: Married    Spouse name: N/A  . Number of children: N/A  . Years of education: N/A   Occupational History  . unemployed    Social History Main Topics  . Smoking status: Former Smoker    Packs/day: 1.00    Years: 10.00    Types: Cigarettes    Quit date: 07/29/1984  . Smokeless tobacco: Never Used  . Alcohol use Yes     Comment: occassionally  . Drug use: No  . Sexual activity: Yes   Other Topics Concern  . Not on file   Social History Narrative   Married for 24 years    Six children- all live around here. Two daughters live with her   Six grand children    On Disability for mental issues   No pets   Likes to go to movies and out to dinner. She likes to do puzzle books. Likes to read      She eats a healthy diet   She does not exercise              Past Surgical History:    Procedure Laterality Date  . ABDOMINAL HYSTERECTOMY  06/25/2000   left salpingo-oophorectomy  . BACK SURGERY  1997   discetomy, lumbar spinal fusion  . CARDIAC CATHETERIZATION  04/01/2006  . CESAREAN SECTION  1991  . CHOLECYSTECTOMY  1989  . EXPLORATORY LAPAROTOMY  05/08/2000   right salpingo-oophorectomy  . LAMINOTOMY / EXCISION DISK POSTERIOR CERVICAL SPINE  08/10/2009   C7-T1  . 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 SHOULDER ARTHROSCOPY  01/02/2006; 05/31/2004   left 2007; right 2005  . SHOULDER ARTHROSCOPY  07/02/2011   Procedure: ARTHROSCOPY SHOULDER;  Surgeon: Cammie Sickle., MD;  Location: [facility];  Service: Orthopedics;  Laterality: Right;  repair supraspinatus, Debride glenohumeral joint, labrum  . SHOULDER ARTHROSCOPY DISTAL CLAVICLE EXCISION AND OPEN ROTATOR CUFF REPAIR  07/26/2010   right  . THYROID SURGERY  early 2000s   goiter  . TUBAL LIGATION      Family History  Problem Relation Age of Onset  . Coronary artery disease Unknown   . Cancer Paternal Grandfather        Prostate Cancer  . Diabetes Mother   .  Subjective:    Patient ID: Alexandra [name], female    DOB: 12-24-1960, 56 y.o.   MRN: [id number]  HPI 56 year old female who  has a past medical history of Diabetes mellitus ([place]); Fatty liver; GERD (gastroesophageal reflux disease); Hiatal hernia (1998); Hyperlipidemia; Hyperplastic colon polyp; Hypertension; Hyperthyroidism; Hypothyroidism; Rotator cuff tear, right; and Sleep apnea. She presents to the office today for two weeks of vaginal burning and itching.   She denies any vaginal drainage. Has not had any fevers.   She tried OTC Monistat which helped with her itching but her symptoms returned.   Review of Systems See HPI   Past Medical History:  Diagnosis Date  . Diabetes mellitus ([place])   . Fatty liver   . GERD (gastroesophageal reflux disease)    prn med.  . Hiatal hernia 1998  . Hyperlipidemia   . Hyperplastic colon polyp   . Hypertension    new dx. - will start med. 06/28/2011  . Hyperthyroidism   . Hypothyroidism   . Rotator cuff tear, right   . Sleep apnea    prior to gastric bypass - no sleep study since bypass    Social History   Social History  . Marital status: Married    Spouse name: N/A  . Number of children: N/A  . Years of education: N/A   Occupational History  . unemployed    Social History Main Topics  . Smoking status: Former Smoker    Packs/day: 1.00    Years: 10.00    Types: Cigarettes    Quit date: 07/29/1984  . Smokeless tobacco: Never Used  . Alcohol use Yes     Comment: occassionally  . Drug use: No  . Sexual activity: Yes   Other Topics Concern  . Not on file   Social History Narrative   Married for 24 years    Six children- all live around here. Two daughters live with her   Six grand children    On Disability for mental issues   No pets   Likes to go to movies and out to dinner. She likes to do puzzle books. Likes to read      She eats a healthy diet   She does not exercise              Past Surgical History:    Procedure Laterality Date  . ABDOMINAL HYSTERECTOMY  06/25/2000   left salpingo-oophorectomy  . BACK SURGERY  1997   discetomy, lumbar spinal fusion  . CARDIAC CATHETERIZATION  04/01/2006  . CESAREAN SECTION  1991  . CHOLECYSTECTOMY  1989  . EXPLORATORY LAPAROTOMY  05/08/2000   right salpingo-oophorectomy  . LAMINOTOMY / EXCISION DISK POSTERIOR CERVICAL SPINE  08/10/2009   C7-T1  . 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 SHOULDER ARTHROSCOPY  01/02/2006; 05/31/2004   left 2007; right 2005  . SHOULDER ARTHROSCOPY  07/02/2011   Procedure: ARTHROSCOPY SHOULDER;  Surgeon: Cammie Sickle., MD;  Location: [facility];  Service: Orthopedics;  Laterality: Right;  repair supraspinatus, Debride glenohumeral joint, labrum  . SHOULDER ARTHROSCOPY DISTAL CLAVICLE EXCISION AND OPEN ROTATOR CUFF REPAIR  07/26/2010   right  . THYROID SURGERY  early 2000s   goiter  . TUBAL LIGATION      Family History  Problem Relation Age of Onset  . Coronary artery disease Unknown   . Cancer Paternal Grandfather        Prostate Cancer  . Diabetes Mother   .

## 2017-03-04 ENCOUNTER — Other Ambulatory Visit: Payer: Self-pay | Admitting: Adult Health

## 2017-03-04 DIAGNOSIS — Z760 Encounter for issue of repeat prescription: Secondary | ICD-10-CM

## 2017-03-05 NOTE — Telephone Encounter (Signed)
Ok to refill for 30 days  

## 2017-03-05 NOTE — Telephone Encounter (Signed)
Called to the pharmacy and left on machine. 

## 2017-03-18 ENCOUNTER — Other Ambulatory Visit: Payer: Self-pay | Admitting: Adult Health

## 2017-03-18 NOTE — Telephone Encounter (Signed)
Sent to the pharmacy by e-scribe for 30 days.  Pt is scheduled for cpx with Cory on 03/25/17.

## 2017-03-25 ENCOUNTER — Encounter: Payer: Self-pay | Admitting: Adult Health

## 2017-03-25 ENCOUNTER — Ambulatory Visit (INDEPENDENT_AMBULATORY_CARE_PROVIDER_SITE_OTHER): Payer: BLUE CROSS/BLUE SHIELD | Admitting: Adult Health

## 2017-03-25 VITALS — BP 110/74 | Temp 98.0°F | Ht 61.75 in | Wt 185.0 lb

## 2017-03-25 DIAGNOSIS — Z0000 Encounter for general adult medical examination without abnormal findings: Secondary | ICD-10-CM | POA: Diagnosis not present

## 2017-03-25 DIAGNOSIS — Z23 Encounter for immunization: Secondary | ICD-10-CM | POA: Diagnosis not present

## 2017-03-25 DIAGNOSIS — E039 Hypothyroidism, unspecified: Secondary | ICD-10-CM | POA: Diagnosis not present

## 2017-03-25 DIAGNOSIS — E782 Mixed hyperlipidemia: Secondary | ICD-10-CM

## 2017-03-25 DIAGNOSIS — E119 Type 2 diabetes mellitus without complications: Secondary | ICD-10-CM

## 2017-03-25 DIAGNOSIS — I10 Essential (primary) hypertension: Secondary | ICD-10-CM | POA: Diagnosis not present

## 2017-03-25 LAB — CBC WITH DIFFERENTIAL/PLATELET
BASOS PCT: 0.3 % (ref 0.0–3.0)
Basophils Absolute: 0 10*3/uL (ref 0.0–0.1)
EOS PCT: 4.5 % (ref 0.0–5.0)
Eosinophils Absolute: 0.4 10*3/uL (ref 0.0–0.7)
HCT: 38.6 % (ref 36.0–46.0)
HEMOGLOBIN: 12.4 g/dL (ref 12.0–15.0)
Lymphocytes Relative: 36 % (ref 12.0–46.0)
Lymphs Abs: 3.5 10*3/uL (ref 0.7–4.0)
MCHC: 32.1 g/dL (ref 30.0–36.0)
MCV: 90.4 fl (ref 78.0–100.0)
MONO ABS: 0.8 10*3/uL (ref 0.1–1.0)
MONOS PCT: 7.8 % (ref 3.0–12.0)
Neutro Abs: 5.1 10*3/uL (ref 1.4–7.7)
Neutrophils Relative %: 51.4 % (ref 43.0–77.0)
Platelets: 378 10*3/uL (ref 150.0–400.0)
RBC: 4.27 Mil/uL (ref 3.87–5.11)
RDW: 15.8 % — AB (ref 11.5–15.5)
WBC: 9.8 10*3/uL (ref 4.0–10.5)

## 2017-03-25 LAB — BASIC METABOLIC PANEL
BUN: 9 mg/dL (ref 6–23)
CHLORIDE: 102 meq/L (ref 96–112)
CO2: 33 mEq/L — ABNORMAL HIGH (ref 19–32)
Calcium: 9.3 mg/dL (ref 8.4–10.5)
Creatinine, Ser: 0.76 mg/dL (ref 0.40–1.20)
GFR: 100.87 mL/min (ref >60.00)
Glucose, Bld: 167 mg/dL — ABNORMAL HIGH (ref 70–99)
POTASSIUM: 3.8 meq/L (ref 3.5–5.1)
SODIUM: 143 meq/L (ref 135–145)

## 2017-03-25 LAB — HEPATIC FUNCTION PANEL
ALBUMIN: 4.1 g/dL (ref 3.5–5.2)
ALK PHOS: 70 U/L (ref 39–117)
ALT: 24 U/L (ref 0–35)
AST: 26 U/L (ref 0–37)
BILIRUBIN DIRECT: 0.1 mg/dL (ref 0.0–0.3)
TOTAL PROTEIN: 7 g/dL (ref 6.0–8.3)
Total Bilirubin: 0.4 mg/dL (ref 0.2–1.2)

## 2017-03-25 LAB — MICROALBUMIN / CREATININE URINE RATIO
CREATININE, U: 129.3 mg/dL
MICROALB UR: 1.1 mg/dL (ref 0.0–1.9)
MICROALB/CREAT RATIO: 0.9 mg/g (ref 0.0–30.0)

## 2017-03-25 LAB — LIPID PANEL
CHOL/HDL RATIO: 5
Cholesterol: 100 mg/dL (ref 0–200)
HDL: 21.2 mg/dL — ABNORMAL LOW (ref >39.00)
LDL Cholesterol: 62 mg/dL (ref 0–99)
NONHDL: 78.77
Triglycerides: 86 mg/dL (ref 0.0–149.0)
VLDL: 17.2 mg/dL (ref 0.0–40.0)

## 2017-03-25 LAB — HEMOGLOBIN A1C: Hgb A1c MFr Bld: 7.5 % — ABNORMAL HIGH (ref 4.6–6.5)

## 2017-03-25 LAB — TSH: TSH: 0.37 u[IU]/mL (ref 0.35–4.50)

## 2017-03-25 NOTE — Progress Notes (Signed)
Subjective:    Patient ID: Alexandra [name], female    DOB: 06-01-1961, 55 y.o.   MRN: [id number]  HPI  Patient presents for yearly preventative medicine examination. She is a pleasant 56 year old female who  has a past medical history of Diabetes mellitus (HCC); Fatty liver; GERD (gastroesophageal reflux disease); Hiatal hernia (1998); Hyperlipidemia; Hyperplastic colon polyp; Hypertension; Hyperthyroidism; Hypothyroidism; Rotator cuff tear, right; and Sleep apnea.  All immunizations and health maintenance protocols were reviewed with the patient and needed orders were placed.  Appropriate screening laboratory values were ordered for the patient including screening of hyperlipidemia, renal function and hepatic function.  Medication reconciliation,  past medical history, social history, problem list and allergies were reviewed in detail with the patient  Goals were established with regard to weight loss, exercise, and  diet in compliance with medications  End of life planning was discussed.  She takes Invokana 100 mg, Metformin 1000mg  BID and Glipizide 10 mg for control of diabetes. She is pretty well controlled in the past. Today she reports that her blood sugars have been elevated, into the 150's-190's. She contributes this to lack of exercise and poor diet.  Lab Results  Component Value Date   HGBA1C 6.8 11/08/2016   For history of hypothyroidism, she takes synthroid 137 mcg   She takes Zocor 20 mg daily for hyperlipidemia   For hypertension management she takes Ziac 10-6.25 mg .   She is up to date on mammograms, eye exams, and colonoscopy. She no longer needs a pap due to hysterectomy.  She does self breast exams and has not noticed any changes in her breast   She has no acute complaints today    Review of Systems  Constitutional: Negative.   HENT: Negative.   Eyes: Negative.   Cardiovascular: Negative.   Gastrointestinal: Negative.   Endocrine: Negative.     Genitourinary: Negative.   Musculoskeletal: Negative.   Skin: Negative.   Allergic/Immunologic: Negative.   Neurological: Negative.   Hematological: Negative.   Psychiatric/Behavioral: Negative.   All other systems reviewed and are negative.  Past Medical History:  Diagnosis Date  . Diabetes mellitus (HCC)   . Fatty liver   . GERD (gastroesophageal reflux disease)    prn med.  . Hiatal hernia 1998  . Hyperlipidemia   . Hyperplastic colon polyp   . Hypertension    new dx. - will start med. 06/28/2011  . Hyperthyroidism   . Hypothyroidism   . Rotator cuff tear, right   . Sleep apnea    prior to gastric bypass - no sleep study since bypass    Social History   Social History  . Marital status: Married    Spouse name: N/A  . Number of children: N/A  . Years of education: N/A   Occupational History  . unemployed    Social History Main Topics  . Smoking status: Former Smoker    Packs/day: 1.00    Years: 10.00    Types: Cigarettes    Quit date: 07/29/1984  . Smokeless tobacco: Never Used  . Alcohol use Yes     Comment: occassionally  . Drug use: No  . Sexual activity: Yes   Other Topics Concern  . Not on file   Social History Narrative   Married for 24 years    Six children- all live around here. Two daughters live with her   Six grand children    On Disability for mental issues   No pets  Likes to go to movies and out to dinner. She likes to do puzzle books. Likes to read      She eats a healthy diet   She does not exercise              Past Surgical History:  Procedure Laterality Date  . ABDOMINAL HYSTERECTOMY  06/25/2000   left salpingo-oophorectomy  . BACK SURGERY  1997   discetomy, lumbar spinal fusion  . CARDIAC CATHETERIZATION  04/01/2006  . CESAREAN SECTION  1991  . CHOLECYSTECTOMY  1989  . EXPLORATORY LAPAROTOMY  05/08/2000   right salpingo-oophorectomy  . LAMINOTOMY / EXCISION DISK POSTERIOR CERVICAL SPINE  08/10/2009   C7-T1  .  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 SHOULDER ARTHROSCOPY  01/02/2006; 05/31/2004   left 2007; right 2005  . SHOULDER ARTHROSCOPY  07/02/2011   Procedure: ARTHROSCOPY SHOULDER;  Surgeon: Wyn Forster., MD;  Location: [facility];  Service: Orthopedics;  Laterality: Right;  repair supraspinatus, Debride glenohumeral joint, labrum  . SHOULDER ARTHROSCOPY DISTAL CLAVICLE EXCISION AND OPEN ROTATOR CUFF REPAIR  07/26/2010   right  . THYROID SURGERY  early 2000s   goiter  . TUBAL LIGATION      Family History  Problem Relation Age of Onset  . Coronary artery disease Unknown   . Cancer Paternal Grandfather        Prostate Cancer  . Diabetes Mother   . Hyperlipidemia Mother   . Hypertension Mother   . Thyroid disease Mother   . Rheum arthritis Mother   . COPD Mother     Allergies  Allergen Reactions  . Shrimp Flavor Swelling    Swelling of tongue and rash  . Ace Inhibitors Cough  . Oxycodone-Acetaminophen Rash    Current Outpatient Prescriptions on File Prior to Visit  Medication Sig Dispense Refill  . aspirin 325 MG tablet Take 1 tablet (325 mg total) by mouth daily. For heart health 30 tablet 3  . benztropine (COGENTIN) 1 MG tablet TAKE 1 TABLET TWICE A DAY FOR PREVENTION OF DRUG RELATED INVOLUNTARY MOVEMENTS 180 tablet 3  . Blood Glucose Monitoring Suppl (ONETOUCH VERIO) w/Device KIT 1 Units by Does not apply route daily. 1 kit 0  . citalopram (CELEXA) 40 MG tablet Take 1 tablet (40 mg total) by mouth daily. Reported on 09/19/2015 90 tablet 1  . glipiZIDE (GLUCOTROL) 10 MG tablet TAKE 1 TABLET TWICE A DAY BEFORE A MEAL 180 tablet 3  . glucose blood (ONETOUCH VERIO) test strip Use as instructed 100 each 12  . INVOKANA 100 MG TABS tablet TAKE 1 TABLET (100 MG TOTAL) BY MOUTH DAILY BEFORE BREAKFAST. 30 tablet 0  . levothyroxine (SYNTHROID) 137 MCG tablet Take 1 tablet (137 mcg total) by mouth daily before breakfast. 90 tablet 2  . LYRICA 200 MG capsule TAKE ONE  CAPSULE BY MOUTH TWICE A DAY 60 capsule 0  . metFORMIN (GLUCOPHAGE) 1000 MG tablet TAKE 1 TABLET TWICE A DAY WITH A MEAL. 180 tablet 3  . nitroGLYCERIN (NITROSTAT) 0.4 MG SL tablet Place 1 tablet (0.4 mg total) under the tongue every 5 (five) minutes as needed for chest pain (CP or SOB). 60 tablet 12  . OLANZapine (ZYPREXA) 20 MG tablet Take 1 tablet (20 mg total) by mouth at bedtime. 90 tablet 1  . ONE TOUCH LANCETS MISC Test blood sugars once daily Dx: E11.9 100 each 3  . simvastatin (ZOCOR) 20 MG tablet TAKE 1 TABLET AT BEDTIME FOR HIGH CHOLESTEROL CONTROL  90 tablet 3  . traZODone (DESYREL) 100 MG tablet Take 1 tablet (100 mg total) by mouth at bedtime. Reported on 09/19/2015 90 tablet 1  . [DISCONTINUED] DULoxetine (CYMBALTA) 20 MG capsule Take 20 mg by mouth daily. Samples per Dr Lovell Sheehan    . [DISCONTINUED] gabapentin (NEURONTIN) 300 MG capsule Take 300 mg by mouth 2 (two) times daily. Per Dr Teressa Senter     Current Facility-Administered Medications on File Prior to Visit  Medication Dose Route Frequency Provider Last Rate Last Dose  . bisoprolol-hydrochlorothiazide (ZIAC) 10-6.25 MG per tablet 1 tablet  1 tablet Oral Daily Geofrey Silliman, NP        There were no vitals taken for this visit.      Objective:   Physical Exam  Constitutional: She is oriented to person, place, and time. She appears well-developed and well-nourished. No distress.  Obese   HENT:  Head: Normocephalic and atraumatic.  Right Ear: External ear normal.  Left Ear: External ear normal.  Nose: Nose normal.  Mouth/Throat: Oropharynx is clear and moist. No oropharyngeal exudate.  Eyes: Pupils are equal, round, and reactive to light. Conjunctivae and EOM are normal. Right eye exhibits no discharge. Left eye exhibits no discharge.  Neck: Normal range of motion. Neck supple. No JVD present. No tracheal deviation present. No thyromegaly present.  Cardiovascular: Normal rate, regular rhythm, normal heart sounds and intact  distal pulses.  Exam reveals no gallop and no friction rub.   No murmur heard. Pulmonary/Chest: Effort normal and breath sounds normal. No stridor. No respiratory distress. She has no wheezes. She has no rales. She exhibits no tenderness.  Abdominal: Soft. Bowel sounds are normal. She exhibits no distension and no mass. There is no tenderness. There is no rebound and no guarding.  Genitourinary:  Genitourinary Comments: Refused breast exam   Musculoskeletal: Normal range of motion. She exhibits no edema, tenderness or deformity.  Lymphadenopathy:    She has no cervical adenopathy.  Neurological: She is alert and oriented to person, place, and time. She has normal reflexes. She displays normal reflexes. No cranial nerve deficit. She exhibits normal muscle tone. Coordination normal.  Skin: Skin is warm and dry. No rash noted. No erythema. No pallor.  Psychiatric: She has a normal mood and affect. Her behavior is normal. Judgment and thought content normal.  Nursing note and vitals reviewed.     Assessment & Plan:  1. Routine general medical examination at a health care facility - Seasonal flu vaccination and Prevnar 23 given in the office today  - Needs to work on diet and exercise  - Basic metabolic panel - CBC with Differential/Platelet - Hemoglobin A1c - Hepatic function panel - Lipid panel - TSH - Microalbumin / creatinine urine ratio  2. Essential hypertension - At goal. No change in medication - Basic metabolic panel - CBC with Differential/Platelet - Hemoglobin A1c - Hepatic function panel - Lipid panel - TSH - Microalbumin / creatinine urine ratio  3. Hypothyroidism, unspecified type - Consider increasing in synthroid  - Basic metabolic panel - CBC with Differential/Platelet - Hemoglobin A1c - Hepatic function panel - Lipid panel - TSH - Microalbumin / creatinine urine ratio  4. Type 2 diabetes mellitus without complication, without long-term current use of  insulin (HCC) - Consider increasing Invokana.  - Educated on the importance of proper diet and routine exercise  - Basic metabolic panel - CBC with Differential/Platelet - Hemoglobin A1c - Hepatic function panel - Lipid panel - TSH - Microalbumin /  creatinine urine ratio  5. Mixed hyperlipidemia - Consider increasing statin  - Basic metabolic panel - CBC with Differential/Platelet - Hemoglobin A1c - Hepatic function panel - Lipid panel - TSH - Microalbumin / creatinine urine ratio  Shirline Frees, NP

## 2017-03-25 NOTE — Addendum Note (Signed)
Addended by: Miles Costain T on: 03/25/2017 08:49 AM   Modules accepted: Orders

## 2017-03-25 NOTE — Patient Instructions (Signed)
It was great seeing you today   Please work on diet and exercise   I will follow up with you regarding your blood work

## 2017-03-26 ENCOUNTER — Other Ambulatory Visit: Payer: Self-pay | Admitting: Adult Health

## 2017-03-26 MED ORDER — LEVOTHYROXINE SODIUM 137 MCG PO TABS: 137.0000 ug | ORAL_TABLET | Freq: Every day | ORAL | 3 refills | Status: DC

## 2017-03-26 MED ORDER — CANAGLIFLOZIN 300 MG PO TABS: 300.0000 mg | ORAL_TABLET | Freq: Every day | ORAL | 3 refills | Status: DC

## 2017-03-26 MED ORDER — BISOPROLOL-HYDROCHLOROTHIAZIDE 10-6.25 MG PO TABS: 1.0000 | ORAL_TABLET | Freq: Every day | ORAL | 3 refills | Status: DC

## 2017-03-27 ENCOUNTER — Other Ambulatory Visit: Payer: Self-pay | Admitting: Family Medicine

## 2017-03-27 MED ORDER — BISOPROLOL-HYDROCHLOROTHIAZIDE 10-6.25 MG PO TABS: 1.0000 | ORAL_TABLET | Freq: Every day | ORAL | 0 refills | Status: DC

## 2017-03-27 MED ORDER — LEVOTHYROXINE SODIUM 137 MCG PO TABS: 137.0000 ug | ORAL_TABLET | Freq: Every day | ORAL | 0 refills | Status: DC

## 2017-03-27 MED ORDER — CANAGLIFLOZIN 300 MG PO TABS: 300.0000 mg | ORAL_TABLET | Freq: Every day | ORAL | 0 refills | Status: DC

## 2017-04-06 ENCOUNTER — Other Ambulatory Visit: Payer: Self-pay | Admitting: Adult Health

## 2017-04-06 DIAGNOSIS — Z760 Encounter for issue of repeat prescription: Secondary | ICD-10-CM

## 2017-04-08 ENCOUNTER — Other Ambulatory Visit: Payer: Self-pay | Admitting: *Deleted

## 2017-04-18 ENCOUNTER — Encounter: Payer: Self-pay | Admitting: Adult Health

## 2017-04-20 ENCOUNTER — Other Ambulatory Visit: Payer: Self-pay | Admitting: Adult Health

## 2017-04-23 NOTE — Telephone Encounter (Signed)
PATIENT RECEIVES MAIL ORDER.  NO NEED TO FILL AT LOCAL PHARMACY.

## 2017-05-06 ENCOUNTER — Encounter: Payer: Self-pay | Admitting: Adult Health

## 2017-05-06 MED ORDER — GLUCOSE BLOOD VI STRP: ORAL_STRIP | 0 refills | Status: DC

## 2017-05-06 MED ORDER — GLUCOSE BLOOD VI STRP: ORAL_STRIP | 3 refills | Status: DC

## 2017-05-07 ENCOUNTER — Other Ambulatory Visit: Payer: Self-pay | Admitting: Adult Health

## 2017-05-07 DIAGNOSIS — Z760 Encounter for issue of repeat prescription: Secondary | ICD-10-CM

## 2017-05-08 NOTE — Telephone Encounter (Signed)
Ok to refill for 30 days + 2

## 2017-05-08 NOTE — Telephone Encounter (Signed)
Called to the pharmacy and left on machine. 

## 2017-05-11 ENCOUNTER — Other Ambulatory Visit: Payer: Self-pay | Admitting: Adult Health

## 2017-05-11 DIAGNOSIS — Z760 Encounter for issue of repeat prescription: Secondary | ICD-10-CM

## 2017-05-13 NOTE — Telephone Encounter (Signed)
DENIED.  THIS MEDICATION WAS CALLED IN ON 05/08/2017.  MESSAGE SENT TO THE PHARMACY TO CHECK FILE.

## 2017-05-29 ENCOUNTER — Other Ambulatory Visit: Payer: Self-pay | Admitting: Adult Health

## 2017-05-29 DIAGNOSIS — Z1231 Encounter for screening mammogram for malignant neoplasm of breast: Secondary | ICD-10-CM

## 2017-06-25 LAB — HM DIABETES EYE EXAM

## 2017-06-26 ENCOUNTER — Ambulatory Visit (INDEPENDENT_AMBULATORY_CARE_PROVIDER_SITE_OTHER): Payer: BLUE CROSS/BLUE SHIELD | Admitting: Adult Health

## 2017-06-26 ENCOUNTER — Encounter: Payer: Self-pay | Admitting: Adult Health

## 2017-06-26 VITALS — BP 104/70 | Temp 98.0°F | Wt 188.0 lb

## 2017-06-26 DIAGNOSIS — E119 Type 2 diabetes mellitus without complications: Secondary | ICD-10-CM

## 2017-06-26 LAB — POCT GLYCOSYLATED HEMOGLOBIN (HGB A1C): HEMOGLOBIN A1C: 8

## 2017-06-26 MED ORDER — DULAGLUTIDE 0.75 MG/0.5ML ~~LOC~~ SOAJ: 0.7500 mg | SUBCUTANEOUS | 3 refills | Status: DC

## 2017-06-26 MED ORDER — CANAGLIFLOZIN 300 MG PO TABS: 300.0000 mg | ORAL_TABLET | Freq: Every day | ORAL | 3 refills | Status: DC

## 2017-06-26 NOTE — Progress Notes (Signed)
Subjective:    Patient ID: Alexandra [name], female    DOB: 15-Nov-1960, 56 y.o.   MRN: [id number]  HPI 56 year old female who  has a past medical history of Diabetes mellitus ([place]), Fatty liver, GERD (gastroesophageal reflux disease), Hiatal hernia (1998), Hyperlipidemia, Hyperplastic colon polyp, Hypertension, Hyperthyroidism, Hypothyroidism, Rotator cuff tear, right, and Sleep apnea.  She presents to the office today for follow up regarding diabetes . Her last A1c in August 2018 was 7.5. Her current regimen includes that glipizide 10 mg, Invokana 300 mg and Metformin 1000 mg BID.   Her weight is up 3 pounds since August.   She has been monitoring her blood sugar at home and reports that she has had readings as high as 500 but most have been in the 200's. This is due to diet and soft drink use.     Review of Systems See HPI   Past Medical History:  Diagnosis Date  . Diabetes mellitus ([place])   . Fatty liver   . GERD (gastroesophageal reflux disease)    prn med.  . Hiatal hernia 1998  . Hyperlipidemia   . Hyperplastic colon polyp   . Hypertension    new dx. - will start med. 06/28/2011  . Hyperthyroidism   . Hypothyroidism   . Rotator cuff tear, right   . Sleep apnea    prior to gastric bypass - no sleep study since bypass    Social History   Socioeconomic History  . Marital status: Married    Spouse name: Not on file  . Number of children: Not on file  . Years of education: Not on file  . Highest education level: Not on file  Social Needs  . Financial resource strain: Not on file  . Food insecurity - worry: Not on file  . Food insecurity - inability: Not on file  . Transportation needs - medical: Not on file  . Transportation needs - non-medical: Not on file  Occupational History  . Occupation: unemployed  Tobacco Use  . Smoking status: Former Smoker    Packs/day: 1.00    Years: 10.00    Pack years: 10.00    Types: Cigarettes    Last attempt to quit:  07/29/1984    Years since quitting: 32.9  . Smokeless tobacco: Never Used  Substance and Sexual Activity  . Alcohol use: Yes    Comment: occassionally  . Drug use: No  . Sexual activity: Yes  Other Topics Concern  . Not on file  Social History Narrative   Married for 24 years    Six children- all live around here. Two daughters live with her   Six grand children    On Disability for mental issues   No pets   Likes to go to movies and out to dinner. She likes to do puzzle books. Likes to read      She eats a healthy diet   She does not exercise           Past Surgical History:  Procedure Laterality Date  . ABDOMINAL HYSTERECTOMY  06/25/2000   left salpingo-oophorectomy  . BACK SURGERY  1997   discetomy, lumbar spinal fusion  . CARDIAC CATHETERIZATION  04/01/2006  . CESAREAN SECTION  1991  . CHOLECYSTECTOMY  1989  . EXPLORATORY LAPAROTOMY  05/08/2000   right salpingo-oophorectomy  . LAMINOTOMY / EXCISION DISK POSTERIOR CERVICAL SPINE  08/10/2009   C7-T1  . 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 SHOULDER ARTHROSCOPY  01/02/2006; 05/31/2004   left 2007; right 2005  . SHOULDER ARTHROSCOPY  07/02/2011   Procedure: ARTHROSCOPY SHOULDER;  Surgeon: Cammie Sickle., MD;  Location: [facility];  Service: Orthopedics;  Laterality: Right;  repair supraspinatus, Debride glenohumeral joint, labrum  . SHOULDER ARTHROSCOPY DISTAL CLAVICLE EXCISION AND OPEN ROTATOR CUFF REPAIR  07/26/2010   right  . THYROID SURGERY  early 2000s   goiter  . TUBAL LIGATION      Family History  Problem Relation Age of Onset  . Coronary artery disease Unknown   . Cancer Paternal Grandfather        Prostate Cancer  . Diabetes Mother   . Hyperlipidemia Mother   . Hypertension Mother   . Thyroid disease Mother   . Rheum arthritis Mother   . COPD Mother     Allergies  Allergen Reactions  . Shrimp Flavor Swelling    Swelling of tongue and rash  . Ace Inhibitors Cough  .  Oxycodone-Acetaminophen Rash    Current Outpatient Medications on File Prior to Visit  Medication Sig Dispense Refill  . aspirin 325 MG tablet Take 1 tablet (325 mg total) by mouth daily. For heart health 30 tablet 3  . benztropine (COGENTIN) 1 MG tablet TAKE 1 TABLET TWICE A DAY FOR PREVENTION OF DRUG RELATED INVOLUNTARY MOVEMENTS 180 tablet 3  . bisoprolol-hydrochlorothiazide (ZIAC) 10-6.25 MG tablet Take 1 tablet by mouth daily. 14 tablet 0  . Blood Glucose Monitoring Suppl (ONETOUCH VERIO) w/Device KIT 1 Units by Does not apply route daily. 1 kit 0  . citalopram (CELEXA) 40 MG tablet Take 1 tablet (40 mg total) by mouth daily. Reported on 09/19/2015 90 tablet 1  . glipiZIDE (GLUCOTROL) 10 MG tablet TAKE 1 TABLET TWICE A DAY BEFORE A MEAL 180 tablet 3  . glucose blood (ONETOUCH VERIO) test strip Use as instructed 100 each 12  . glucose blood (ONETOUCH VERIO) test strip Use to test blood glucose three times daily 90 each 0  . glucose blood (ONETOUCH VERIO) test strip Use to test blood glucose three times daily 270 each 3  . levothyroxine (SYNTHROID) 137 MCG tablet Take 1 tablet (137 mcg total) by mouth daily before breakfast. 14 tablet 0  . LYRICA 200 MG capsule TAKE ONE CAPSULE BY MOUTH TWICE A DAY 60 capsule 2  . metFORMIN (GLUCOPHAGE) 1000 MG tablet TAKE 1 TABLET TWICE A DAY WITH A MEAL. 180 tablet 3  . nitroGLYCERIN (NITROSTAT) 0.4 MG SL tablet Place 1 tablet (0.4 mg total) under the tongue every 5 (five) minutes as needed for chest pain (CP or SOB). 60 tablet 12  . OLANZapine (ZYPREXA) 20 MG tablet Take 1 tablet (20 mg total) by mouth at bedtime. 90 tablet 1  . ONE TOUCH LANCETS MISC Test blood sugars once daily Dx: E11.9 100 each 3  . simvastatin (ZOCOR) 20 MG tablet TAKE 1 TABLET AT BEDTIME FOR HIGH CHOLESTEROL CONTROL 90 tablet 3  . traZODone (DESYREL) 100 MG tablet Take 1 tablet (100 mg total) by mouth at bedtime. Reported on 09/19/2015 90 tablet 1  . [DISCONTINUED] DULoxetine  (CYMBALTA) 20 MG capsule Take 20 mg by mouth daily. Samples per Dr Arnoldo Morale    . [DISCONTINUED] gabapentin (NEURONTIN) 300 MG capsule Take 300 mg by mouth 2 (two) times daily. Per Dr Daylene Katayama     No current facility-administered medications on file prior to visit.     BP 104/70 (BP Location: Left Arm)   Temp 98 F (36.7 C) (  Oral)   Wt 188 lb (85.3 kg)   BMI 34.66 kg/m       Objective:   Physical Exam  Constitutional: She is oriented to person, place, and time. She appears well-developed and well-nourished. No distress.  Cardiovascular: Normal rate, regular rhythm, normal heart sounds and intact distal pulses. Exam reveals no gallop and no friction rub.  No murmur heard. Pulmonary/Chest: Effort normal and breath sounds normal. No respiratory distress. She has no wheezes. She has no rales. She exhibits no tenderness.  Neurological: She is alert and oriented to person, place, and time.  Skin: Skin is warm and dry. No rash noted. No erythema. No pallor.  Psychiatric: She has a normal mood and affect. Her behavior is normal. Judgment and thought content normal.  Nursing note and vitals reviewed.     Assessment & Plan:  1. Diabetes mellitus without complication (HCC)  - POCT A1C - 8.0. Has increased.  - We talked at length about diet and cutting out carbs and soda. I am going to start her on trulicity. She was able to demonstrate in the office correct use of the pen and her first dose was administered today. Sample given  - I am going to cut her back to 500 mg metfomrin BID and titrate from there - Dulaglutide (TRULICITY) 4.04 BV/1.3WU SOPN; Inject 0.75 mg into the skin once a week.  Dispense: 4 pen; Refill: 3 - canagliflozin (INVOKANA) 300 MG TABS tablet; Take 1 tablet (300 mg total) by mouth daily before breakfast.  Dispense: 90 tablet; Refill: 3 - Follow up in 3 months   Dorothyann Peng, NP

## 2017-06-26 NOTE — Patient Instructions (Addendum)
I have prescribed Trulicity to help bring down your A1c   Cut back on Metformin to 500 mg twice a day for the time being.   Please work on diet, cut out soft drinks  I want to see you in 3 months

## 2017-07-01 ENCOUNTER — Ambulatory Visit
Admission: RE | Admit: 2017-07-01 | Discharge: 2017-07-01 | Disposition: A | Discharge status: Home / Self Care | Payer: BLUE CROSS/BLUE SHIELD | Source: Ambulatory Visit | Attending: Adult Health | Admitting: Adult Health

## 2017-07-01 DIAGNOSIS — Z1231 Encounter for screening mammogram for malignant neoplasm of breast: Secondary | ICD-10-CM

## 2017-07-03 ENCOUNTER — Encounter: Payer: Self-pay | Admitting: Family Medicine

## 2017-07-17 ENCOUNTER — Encounter: Payer: Self-pay | Admitting: Adult Health

## 2017-07-17 ENCOUNTER — Other Ambulatory Visit: Payer: Self-pay | Admitting: Family Medicine

## 2017-07-17 DIAGNOSIS — E119 Type 2 diabetes mellitus without complications: Secondary | ICD-10-CM

## 2017-07-17 MED ORDER — DULAGLUTIDE 0.75 MG/0.5ML ~~LOC~~ SOAJ: 0.7500 mg | SUBCUTANEOUS | 0 refills | Status: DC

## 2017-07-25 NOTE — Telephone Encounter (Signed)
Copied from [employer]. Topic: General - Other >> Jul 25, 2017  2:33 PM Neva Seat wrote: Pt's lancets Rx has expired - please call in her a new Rx to get Lancests for BS testing asap.  [facility] - [address]- [phone]

## 2017-08-01 MED ORDER — ONETOUCH LANCETS MISC: 3 refills | Status: AC

## 2017-08-01 NOTE — Telephone Encounter (Signed)
Patient is calling stating they have not received the RX yet. Calling to check on the status

## 2017-08-01 NOTE — Telephone Encounter (Signed)
Sent to the pharmacy by e-scribe. 

## 2017-08-08 ENCOUNTER — Encounter: Payer: Self-pay | Admitting: Adult Health

## 2017-08-25 ENCOUNTER — Encounter: Payer: Self-pay | Admitting: Adult Health

## 2017-08-26 ENCOUNTER — Other Ambulatory Visit: Payer: Self-pay | Admitting: Adult Health

## 2017-08-26 MED ORDER — GABAPENTIN 300 MG PO CAPS: 300.0000 mg | ORAL_CAPSULE | Freq: Two times a day (BID) | ORAL | 0 refills | Status: DC

## 2017-08-26 MED ORDER — GABAPENTIN 300 MG PO CAPS: 300.0000 mg | ORAL_CAPSULE | Freq: Two times a day (BID) | ORAL | 1 refills | Status: DC

## 2017-09-22 ENCOUNTER — Other Ambulatory Visit: Payer: Self-pay | Admitting: Adult Health

## 2017-09-23 NOTE — Telephone Encounter (Signed)
REQUEST DENIED TO LOCAL PHARMACY.  WAS SENT TO [facility] IN JAN 2019.

## 2017-09-25 ENCOUNTER — Ambulatory Visit: Payer: Self-pay | Admitting: Adult Health

## 2017-10-08 ENCOUNTER — Ambulatory Visit (INDEPENDENT_AMBULATORY_CARE_PROVIDER_SITE_OTHER): Payer: BLUE CROSS/BLUE SHIELD | Admitting: Adult Health

## 2017-10-08 ENCOUNTER — Encounter: Payer: Self-pay | Admitting: Adult Health

## 2017-10-08 VITALS — BP 108/70 | Temp 97.9°F | Wt 176.0 lb

## 2017-10-08 DIAGNOSIS — E119 Type 2 diabetes mellitus without complications: Secondary | ICD-10-CM

## 2017-10-08 DIAGNOSIS — F339 Major depressive disorder, recurrent, unspecified: Secondary | ICD-10-CM

## 2017-10-08 LAB — POCT GLYCOSYLATED HEMOGLOBIN (HGB A1C): HEMOGLOBIN A1C: 6.9

## 2017-10-08 MED ORDER — GLIPIZIDE 10 MG PO TABS: 10.0000 mg | ORAL_TABLET | Freq: Two times a day (BID) | ORAL | 0 refills | Status: DC

## 2017-10-08 MED ORDER — GLIPIZIDE 10 MG PO TABS: ORAL_TABLET | ORAL | 3 refills | Status: DC

## 2017-10-08 NOTE — Progress Notes (Signed)
Subjective:    Patient ID: Alexandra [name], female    DOB: Oct 09, 1960, 57 y.o.   MRN: [id number]  HPI  57 year old female who  has a past medical history of Diabetes mellitus ([place]), Fatty liver, GERD (gastroesophageal reflux disease), Hiatal hernia (1998), Hyperlipidemia, Hyperplastic colon polyp, Hypertension, Hyperthyroidism, Hypothyroidism, Rotator cuff tear, right, and Sleep apnea.  She presents to the office today for follow up regarding diabetes. She is currently maintained on Invokana 300 mg, Glipizide, 10 mg, and Metformin 8891 mg BID, and Trulicity 6.94 mg  Her last A1c in November 2018 was 8.0.   Today in the office she reports that her blood sugars have been an average of 115.   She does reports that her depression has been worse. She is only eating one meal a day, having a hard time getting out of bed in the morning and is constantly fatigued. She is managed by [facility] and has an appointment in April. She denies suicidal ideation. She feels as though most of her depression is stemming from the death of her father less than a year ago.   Wt Readings from Last 3 Encounters:  10/08/17 176 lb (79.8 kg)  06/26/17 188 lb (85.3 kg)  03/25/17 185 lb (83.9 kg)    Review of Systems See HPI   Past Medical History:  Diagnosis Date  . Diabetes mellitus ([place])   . Fatty liver   . GERD (gastroesophageal reflux disease)    prn med.  . Hiatal hernia 1998  . Hyperlipidemia   . Hyperplastic colon polyp   . Hypertension    new dx. - will start med. 06/28/2011  . Hyperthyroidism   . Hypothyroidism   . Rotator cuff tear, right   . Sleep apnea    prior to gastric bypass - no sleep study since bypass    Social History   Socioeconomic History  . Marital status: Married    Spouse name: Not on file  . Number of children: Not on file  . Years of education: Not on file  . Highest education level: Not on file  Social Needs  . Financial resource strain: Not on file  . Food  insecurity - worry: Not on file  . Food insecurity - inability: Not on file  . Transportation needs - medical: Not on file  . Transportation needs - non-medical: Not on file  Occupational History  . Occupation: unemployed  Tobacco Use  . Smoking status: Former Smoker    Packs/day: 1.00    Years: 10.00    Pack years: 10.00    Types: Cigarettes    Last attempt to quit: 07/29/1984    Years since quitting: 33.2  . Smokeless tobacco: Never Used  Substance and Sexual Activity  . Alcohol use: Yes    Comment: occassionally  . Drug use: No  . Sexual activity: Yes  Other Topics Concern  . Not on file  Social History Narrative   Married for 24 years    Six children- all live around here. Two daughters live with her   Six grand children    On Disability for mental issues   No pets   Likes to go to movies and out to dinner. She likes to do puzzle books. Likes to read      She eats a healthy diet   She does not exercise           Past Surgical History:  Procedure Laterality Date  . ABDOMINAL HYSTERECTOMY  06/25/2000   left salpingo-oophorectomy  . BACK SURGERY  1997   discetomy, lumbar spinal fusion  . CARDIAC CATHETERIZATION  04/01/2006  . CESAREAN SECTION  1991  . CHOLECYSTECTOMY  1989  . EXPLORATORY LAPAROTOMY  05/08/2000   right salpingo-oophorectomy  . LAMINOTOMY / EXCISION DISK POSTERIOR CERVICAL SPINE  08/10/2009   C7-T1  . 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 SHOULDER ARTHROSCOPY  01/02/2006; 05/31/2004   left 2007; right 2005  . SHOULDER ARTHROSCOPY  07/02/2011   Procedure: ARTHROSCOPY SHOULDER;  Surgeon: Cammie Sickle., MD;  Location: [facility];  Service: Orthopedics;  Laterality: Right;  repair supraspinatus, Debride glenohumeral joint, labrum  . SHOULDER ARTHROSCOPY DISTAL CLAVICLE EXCISION AND OPEN ROTATOR CUFF REPAIR  07/26/2010   right  . THYROID SURGERY  early 2000s   goiter  . TUBAL LIGATION      Family History  Problem Relation Age of  Onset  . Coronary artery disease Unknown   . Cancer Paternal Grandfather        Prostate Cancer  . Diabetes Mother   . Hyperlipidemia Mother   . Hypertension Mother   . Thyroid disease Mother   . Rheum arthritis Mother   . COPD Mother     Allergies  Allergen Reactions  . Shrimp Flavor Swelling    Swelling of tongue and rash  . Ace Inhibitors Cough  . Oxycodone-Acetaminophen Rash    Current Outpatient Medications on File Prior to Visit  Medication Sig Dispense Refill  . aspirin 325 MG tablet Take 1 tablet (325 mg total) by mouth daily. For heart health 30 tablet 3  . benztropine (COGENTIN) 1 MG tablet TAKE 1 TABLET TWICE A DAY FOR PREVENTION OF DRUG RELATED INVOLUNTARY MOVEMENTS 180 tablet 3  . bisoprolol-hydrochlorothiazide (ZIAC) 10-6.25 MG tablet Take 1 tablet by mouth daily. 14 tablet 0  . Blood Glucose Monitoring Suppl (ONETOUCH VERIO) w/Device KIT 1 Units by Does not apply route daily. 1 kit 0  . canagliflozin (INVOKANA) 300 MG TABS tablet Take 1 tablet (300 mg total) by mouth daily before breakfast. 90 tablet 3  . citalopram (CELEXA) 40 MG tablet Take 1 tablet (40 mg total) by mouth daily. Reported on 09/19/2015 90 tablet 1  . Dulaglutide (TRULICITY) 8.91 QX/4.5WT SOPN Inject 0.75 mg into the skin once a week. 12 pen 0  . gabapentin (NEURONTIN) 300 MG capsule Take 1 capsule (300 mg total) by mouth 2 (two) times daily. 180 capsule 1  . glipiZIDE (GLUCOTROL) 10 MG tablet TAKE 1 TABLET TWICE A DAY BEFORE A MEAL 180 tablet 3  . glucose blood (ONETOUCH VERIO) test strip Use as instructed 100 each 12  . glucose blood (ONETOUCH VERIO) test strip Use to test blood glucose three times daily 270 each 3  . levothyroxine (SYNTHROID) 137 MCG tablet Take 1 tablet (137 mcg total) by mouth daily before breakfast. 14 tablet 0  . metFORMIN (GLUCOPHAGE) 1000 MG tablet TAKE 1 TABLET TWICE A DAY WITH A MEAL. 180 tablet 3  . nitroGLYCERIN (NITROSTAT) 0.4 MG SL tablet Place 1 tablet (0.4 mg total)  under the tongue every 5 (five) minutes as needed for chest pain (CP or SOB). 60 tablet 12  . ONE TOUCH LANCETS MISC Test blood sugars once daily Dx: E11.9 100 each 3  . risperiDONE (RISPERDAL) 1 MG tablet Take 1 mg by mouth 2 (two) times daily.  2  . simvastatin (ZOCOR) 20 MG tablet TAKE 1 TABLET AT BEDTIME FOR HIGH CHOLESTEROL CONTROL 90  tablet 3  . traZODone (DESYREL) 100 MG tablet Take 1 tablet (100 mg total) by mouth at bedtime. Reported on 09/19/2015 90 tablet 1  . [DISCONTINUED] DULoxetine (CYMBALTA) 20 MG capsule Take 20 mg by mouth daily. Samples per Dr Arnoldo Morale     No current facility-administered medications on file prior to visit.     BP 108/70 (BP Location: Left Arm)   Temp 97.9 F (36.6 C) (Oral)   Wt 176 lb (79.8 kg)   BMI 32.45 kg/m       Objective:   Physical Exam  Constitutional: She is oriented to person, place, and time. She appears well-developed and well-nourished. No distress.  Cardiovascular: Normal rate, regular rhythm, normal heart sounds and intact distal pulses. Exam reveals no gallop and no friction rub.  No murmur heard. Pulmonary/Chest: Effort normal and breath sounds normal. No respiratory distress. She has no wheezes. She has no rales. She exhibits no tenderness.  Neurological: She is alert and oriented to person, place, and time.  Skin: Skin is warm and dry. No rash noted. She is not diaphoretic. No erythema. No pallor.  Psychiatric: She has a normal mood and affect. Her behavior is normal. Judgment and thought content normal.  Nursing note and vitals reviewed.     Assessment & Plan:  1. Type 2 diabetes mellitus without complication, without long-term current use of insulin (HCC) - POCT A1C- 6.9 - has improved  - Follow up in 3 months or sooner if needed   2. Depression, recurrent ([place]) - Advised to follow up with [facility]  - if she develops any SI then follow up in the ER right away    Dorothyann Peng, NP

## 2017-10-08 NOTE — Progress Notes (Signed)
Subjective:    Patient ID: Alexandra [name], female    DOB: [birth year], 57 y.o.   MRN: [id number]  HPI Patient is seen today for chronic medical management of HTN and diabetes.  Her A1C in November was 8.0, POC A1C today is 6.9.  She has been checking her fasting blood sugars at home and reports an average of 115.  Her lowest has been 107 where she experiences shakiness and confusion.  She eats a piece of hard candy at this time.  Her highest has been a random evening sugar of 265.  Her weight is down 12 pounds in the past 4 months.  She admits that she is experiencing depression.  She is seen by [facility] for psychiatry.  She last saw them in February and is scheduled to see them in April.  She reports difficulty sleeping at night, fatigue during the day, anhedonia, overall sadness without crying, difficulty concentrating and loss of appetite.  She relates this to the loss of her dad last May and missing him.    Review of Systems  Constitutional: Positive for appetite change, fatigue and unexpected weight change. Negative for activity change and fever.  Respiratory: Negative for chest tightness and shortness of breath.   Cardiovascular: Negative.   Gastrointestinal: Negative for constipation, diarrhea, nausea and vomiting.  Endocrine: Negative for polydipsia and polyuria.  Neurological: Negative for dizziness, seizures and headaches.  Psychiatric/Behavioral: Positive for decreased concentration and sleep disturbance. Negative for hallucinations and suicidal ideas. The patient is nervous/anxious.       Past Medical History:  Diagnosis Date  . Diabetes mellitus (HCC)   . Fatty liver   . GERD (gastroesophageal reflux disease)    prn med.  . Hiatal hernia 1998  . Hyperlipidemia   . Hyperplastic colon polyp   . Hypertension    new dx. - will start med. 06/28/2011  . Hyperthyroidism   . Hypothyroidism   . Rotator cuff tear, right   . Sleep apnea    prior to gastric bypass - no sleep  study since bypass    Social History   Socioeconomic History  . Marital status: Married    Spouse name: Not on file  . Number of children: Not on file  . Years of education: Not on file  . Highest education level: Not on file  Social Needs  . Financial resource strain: Not on file  . Food insecurity - worry: Not on file  . Food insecurity - inability: Not on file  . Transportation needs - medical: Not on file  . Transportation needs - non-medical: Not on file  Occupational History  . Occupation: unemployed  Tobacco Use  . Smoking status: Former Smoker    Packs/day: 1.00    Years: 10.00    Pack years: 10.00    Types: Cigarettes    Last attempt to quit: 07/29/1984    Years since quitting: 33.2  . Smokeless tobacco: Never Used  Substance and Sexual Activity  . Alcohol use: Yes    Comment: occassionally  . Drug use: No  . Sexual activity: Yes  Other Topics Concern  . Not on file  Social History Narrative   Married for 24 years    Six children- all live around here. Two daughters live with her   Six grand children    On Disability for mental issues   No pets   Likes to go to movies and out to dinner. She likes to do puzzle books. Likes to  read      She eats a healthy diet   She does not exercise           Past Surgical History:  Procedure Laterality Date  . ABDOMINAL HYSTERECTOMY  06/25/2000   left salpingo-oophorectomy  . BACK SURGERY  1997   discetomy, lumbar spinal fusion  . CARDIAC CATHETERIZATION  04/01/2006  . CESAREAN SECTION  1991  . CHOLECYSTECTOMY  1989  . EXPLORATORY LAPAROTOMY  05/08/2000   right salpingo-oophorectomy  . LAMINOTOMY / EXCISION DISK POSTERIOR CERVICAL SPINE  08/10/2009   C7-T1  . 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 SHOULDER ARTHROSCOPY  01/02/2006; 05/31/2004   left 2007; right 2005  . SHOULDER ARTHROSCOPY  07/02/2011   Procedure: ARTHROSCOPY SHOULDER;  Surgeon: Wyn Forster., MD;  Location: [facility];  Service:  Orthopedics;  Laterality: Right;  repair supraspinatus, Debride glenohumeral joint, labrum  . SHOULDER ARTHROSCOPY DISTAL CLAVICLE EXCISION AND OPEN ROTATOR CUFF REPAIR  07/26/2010   right  . THYROID SURGERY  early 2000s   goiter  . TUBAL LIGATION      Family History  Problem Relation Age of Onset  . Coronary artery disease Unknown   . Cancer Paternal Grandfather        Prostate Cancer  . Diabetes Mother   . Hyperlipidemia Mother   . Hypertension Mother   . Thyroid disease Mother   . Rheum arthritis Mother   . COPD Mother     Allergies  Allergen Reactions  . Shrimp Flavor Swelling    Swelling of tongue and rash  . Ace Inhibitors Cough  . Oxycodone-Acetaminophen Rash    Current Outpatient Medications on File Prior to Visit  Medication Sig Dispense Refill  . aspirin 325 MG tablet Take 1 tablet (325 mg total) by mouth daily. For heart health 30 tablet 3  . benztropine (COGENTIN) 1 MG tablet TAKE 1 TABLET TWICE A DAY FOR PREVENTION OF DRUG RELATED INVOLUNTARY MOVEMENTS 180 tablet 3  . bisoprolol-hydrochlorothiazide (ZIAC) 10-6.25 MG tablet Take 1 tablet by mouth daily. 14 tablet 0  . Blood Glucose Monitoring Suppl (ONETOUCH VERIO) w/Device KIT 1 Units by Does not apply route daily. 1 kit 0  . canagliflozin (INVOKANA) 300 MG TABS tablet Take 1 tablet (300 mg total) by mouth daily before breakfast. 90 tablet 3  . citalopram (CELEXA) 40 MG tablet Take 1 tablet (40 mg total) by mouth daily. Reported on 09/19/2015 90 tablet 1  . Dulaglutide (TRULICITY) 0.75 MG/0.5ML SOPN Inject 0.75 mg into the skin once a week. 12 pen 0  . gabapentin (NEURONTIN) 300 MG capsule Take 1 capsule (300 mg total) by mouth 2 (two) times daily. 180 capsule 1  . glipiZIDE (GLUCOTROL) 10 MG tablet TAKE 1 TABLET TWICE A DAY BEFORE A MEAL 180 tablet 3  . glucose blood (ONETOUCH VERIO) test strip Use as instructed 100 each 12  . glucose blood (ONETOUCH VERIO) test strip Use to test blood glucose three times daily  270 each 3  . levothyroxine (SYNTHROID) 137 MCG tablet Take 1 tablet (137 mcg total) by mouth daily before breakfast. 14 tablet 0  . metFORMIN (GLUCOPHAGE) 1000 MG tablet TAKE 1 TABLET TWICE A DAY WITH A MEAL. 180 tablet 3  . nitroGLYCERIN (NITROSTAT) 0.4 MG SL tablet Place 1 tablet (0.4 mg total) under the tongue every 5 (five) minutes as needed for chest pain (CP or SOB). 60 tablet 12  . ONE TOUCH LANCETS MISC Test blood sugars once daily Dx:  E11.9 100 each 3  . risperiDONE (RISPERDAL) 1 MG tablet Take 1 mg by mouth 2 (two) times daily.  2  . simvastatin (ZOCOR) 20 MG tablet TAKE 1 TABLET AT BEDTIME FOR HIGH CHOLESTEROL CONTROL 90 tablet 3  . traZODone (DESYREL) 100 MG tablet Take 1 tablet (100 mg total) by mouth at bedtime. Reported on 09/19/2015 90 tablet 1  . [DISCONTINUED] DULoxetine (CYMBALTA) 20 MG capsule Take 20 mg by mouth daily. Samples per Dr Lovell Sheehan     No current facility-administered medications on file prior to visit.     BP 108/70 (BP Location: Left Arm)   Temp 97.9 F (36.6 C) (Oral)   Wt 176 lb (79.8 kg)   BMI 32.45 kg/m    Objective:   Physical Exam  Constitutional: She is oriented to person, place, and time. She appears well-developed and well-nourished. No distress.  Cardiovascular: Normal rate, regular rhythm, normal heart sounds and intact distal pulses. Exam reveals no gallop and no friction rub.  No murmur heard. Pulmonary/Chest: Effort normal and breath sounds normal. No respiratory distress. She has no wheezes. She has no rales.  Neurological: She is alert and oriented to person, place, and time.  Skin: Skin is warm and dry. She is not diaphoretic.  Psychiatric: Judgment normal. Her mood appears anxious. She is slowed. She is not agitated and not actively hallucinating. Thought content is not paranoid. Cognition and memory are normal. She exhibits a depressed mood. She expresses no homicidal and no suicidal ideation.  Nursing note and vitals reviewed.       Assessment & Plan:  1. Type 2 diabetes mellitus without complication, without long-term current use of insulin (HCC) Excellent control of blood sugars.  She is skipping meals.  I have encouraged her to make sure she is eating three meals a day and making healthy choices for meals and snacks.  Her weight is down this visit.  Will continue to monitor this at next appointment.  Continue to check fasting blood sugars.  Will recheck A1C and follow up in 3 months.  - POCT A1C  2. Depression, recurrent (HCC) Worsening depression symptoms without SI or HI.  She is scheduled to see her provider at [facility] in April.  I have asked her to call them and see if she can be seen earlier.  They are managing her psychiatric medications.  She is given information about going to the ER if she experiences SI or HI.   Jalik Gellatly C Nicholes Hibler BSN NP student

## 2017-12-02 ENCOUNTER — Other Ambulatory Visit: Payer: Self-pay | Admitting: Adult Health

## 2017-12-02 DIAGNOSIS — E119 Type 2 diabetes mellitus without complications: Secondary | ICD-10-CM

## 2017-12-02 NOTE — Telephone Encounter (Signed)
Sent to the pharmacy by e-scribe. 

## 2017-12-17 ENCOUNTER — Encounter: Payer: Self-pay | Admitting: Adult Health

## 2017-12-18 MED ORDER — GABAPENTIN 300 MG PO CAPS: 300.0000 mg | ORAL_CAPSULE | Freq: Two times a day (BID) | ORAL | 0 refills | Status: DC

## 2017-12-26 ENCOUNTER — Other Ambulatory Visit: Payer: Self-pay | Admitting: Adult Health

## 2017-12-26 DIAGNOSIS — Z760 Encounter for issue of repeat prescription: Secondary | ICD-10-CM

## 2017-12-26 DIAGNOSIS — E118 Type 2 diabetes mellitus with unspecified complications: Secondary | ICD-10-CM

## 2017-12-26 NOTE — Telephone Encounter (Signed)
Sent to the pharmacy by e-scribe for 90 days.  Has upcoming follow up on 01/08/18

## 2017-12-29 ENCOUNTER — Other Ambulatory Visit: Payer: Self-pay | Admitting: Adult Health

## 2017-12-29 DIAGNOSIS — Z760 Encounter for issue of repeat prescription: Secondary | ICD-10-CM

## 2017-12-29 DIAGNOSIS — E7800 Pure hypercholesterolemia, unspecified: Secondary | ICD-10-CM

## 2017-12-30 NOTE — Telephone Encounter (Signed)
Sent to the pharmacy by e-scribe for 90 days. 

## 2018-01-08 ENCOUNTER — Ambulatory Visit: Payer: BLUE CROSS/BLUE SHIELD | Admitting: Adult Health

## 2018-01-09 ENCOUNTER — Ambulatory Visit (INDEPENDENT_AMBULATORY_CARE_PROVIDER_SITE_OTHER): Payer: BLUE CROSS/BLUE SHIELD | Admitting: Adult Health

## 2018-01-09 ENCOUNTER — Encounter: Payer: Self-pay | Admitting: Adult Health

## 2018-01-09 VITALS — BP 100/70 | Temp 97.7°F | Wt 168.0 lb

## 2018-01-09 DIAGNOSIS — E119 Type 2 diabetes mellitus without complications: Secondary | ICD-10-CM | POA: Diagnosis not present

## 2018-01-09 LAB — POCT GLYCOSYLATED HEMOGLOBIN (HGB A1C): HbA1c, POC (controlled diabetic range): 5.9 % (ref 0.0–7.0)

## 2018-01-09 NOTE — Progress Notes (Signed)
Subjective:    Patient ID: Alexandra [name], female    DOB: 1960-11-21, 57 y.o.   MRN: [id number]  HPI  57 year old female who  has a past medical history of Diabetes mellitus ([place]), Fatty liver, GERD (gastroesophageal reflux disease), Hiatal hernia (1998), Hyperlipidemia, Hyperplastic colon polyp, Hypertension, Hyperthyroidism, Hypothyroidism, Rotator cuff tear, right, and Sleep apnea.   She presents to the office today for three month follow up regarding diabetes. She is currently prescribed on glipizide 10 mg, metformin 1000 mg, Invokana 093 mg and Trulicity 8.18 mg.   Her last A1c was 6.9.   Over the last three months she has been walking and trying to eat healthy. She has been monitoring her blood sugars and reports that her blood sugars have been below 120. She has had a few hypoglycemic events.   Wt Readings from Last 3 Encounters:  01/09/18 168 lb (76.2 kg)  10/08/17 176 lb (79.8 kg)  06/26/17 188 lb (85.3 kg)    Review of Systems  See HPI   Past Medical History:  Diagnosis Date  . Diabetes mellitus ([place])   . Fatty liver   . GERD (gastroesophageal reflux disease)    prn med.  . Hiatal hernia 1998  . Hyperlipidemia   . Hyperplastic colon polyp   . Hypertension    new dx. - will start med. 06/28/2011  . Hyperthyroidism   . Hypothyroidism   . Rotator cuff tear, right   . Sleep apnea    prior to gastric bypass - no sleep study since bypass    Social History   Socioeconomic History  . Marital status: Married    Spouse name: Not on file  . Number of children: Not on file  . Years of education: Not on file  . Highest education level: Not on file  Occupational History  . Occupation: unemployed  Social Needs  . Financial resource strain: Not on file  . Food insecurity:    Worry: Not on file    Inability: Not on file  . Transportation needs:    Medical: Not on file    Non-medical: Not on file  Tobacco Use  . Smoking status: Former Smoker    Packs/day:  1.00    Years: 10.00    Pack years: 10.00    Types: Cigarettes    Last attempt to quit: 07/29/1984    Years since quitting: 33.4  . Smokeless tobacco: Never Used  Substance and Sexual Activity  . Alcohol use: Yes    Comment: occassionally  . Drug use: No  . Sexual activity: Yes  Lifestyle  . Physical activity:    Days per week: Not on file    Minutes per session: Not on file  . Stress: Not on file  Relationships  . Social connections:    Talks on phone: Not on file    Gets together: Not on file    Attends religious service: Not on file    Active member of club or organization: Not on file    Attends meetings of clubs or organizations: Not on file    Relationship status: Not on file  . Intimate partner violence:    Fear of current or ex partner: Not on file    Emotionally abused: Not on file    Physically abused: Not on file    Forced sexual activity: Not on file  Other Topics Concern  . Not on file  Social History Narrative   Married for 24 years  Six children- all live around here. Two daughters live with her   Six grand children    On Disability for mental issues   No pets   Likes to go to movies and out to dinner. She likes to do puzzle books. Likes to read      She eats a healthy diet   She does not exercise           Past Surgical History:  Procedure Laterality Date  . ABDOMINAL HYSTERECTOMY  06/25/2000   left salpingo-oophorectomy  . BACK SURGERY  1997   discetomy, lumbar spinal fusion  . CARDIAC CATHETERIZATION  04/01/2006  . CESAREAN SECTION  1991  . CHOLECYSTECTOMY  1989  . EXPLORATORY LAPAROTOMY  05/08/2000   right salpingo-oophorectomy  . LAMINOTOMY / EXCISION DISK POSTERIOR CERVICAL SPINE  08/10/2009   C7-T1  . 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 SHOULDER ARTHROSCOPY  01/02/2006; 05/31/2004   left 2007; right 2005  . SHOULDER ARTHROSCOPY  07/02/2011   Procedure: ARTHROSCOPY SHOULDER;  Surgeon: Cammie Sickle., MD;  Location: [facility];  Service: Orthopedics;  Laterality: Right;  repair supraspinatus, Debride glenohumeral joint, labrum  . SHOULDER ARTHROSCOPY DISTAL CLAVICLE EXCISION AND OPEN ROTATOR CUFF REPAIR  07/26/2010   right  . THYROID SURGERY  early 2000s   goiter  . TUBAL LIGATION      Family History  Problem Relation Age of Onset  . Coronary artery disease Unknown   . Cancer Paternal Grandfather        Prostate Cancer  . Diabetes Mother   . Hyperlipidemia Mother   . Hypertension Mother   . Thyroid disease Mother   . Rheum arthritis Mother   . COPD Mother     Allergies  Allergen Reactions  . Shrimp Flavor Swelling    Swelling of tongue and rash  . Ace Inhibitors Cough  . Oxycodone-Acetaminophen Rash    Current Outpatient Medications on File Prior to Visit  Medication Sig Dispense Refill  . aspirin 325 MG tablet Take 1 tablet (325 mg total) by mouth daily. For heart health 30 tablet 3  . benztropine (COGENTIN) 1 MG tablet TAKE 1 TABLET TWICE A DAY FOR PREVENTION OF DRUG RELATED INVOLUNTARY MOVEMENTS 180 tablet 3  . bisoprolol-hydrochlorothiazide (ZIAC) 10-6.25 MG tablet Take 1 tablet by mouth daily. 14 tablet 0  . Blood Glucose Monitoring Suppl (ONETOUCH VERIO) w/Device KIT 1 Units by Does not apply route daily. 1 kit 0  . citalopram (CELEXA) 40 MG tablet Take 1 tablet (40 mg total) by mouth daily. Reported on 09/19/2015 90 tablet 1  . glipiZIDE (GLUCOTROL) 10 MG tablet TAKE 1 TABLET TWICE A DAY BEFORE A MEAL 180 tablet 3  . glucose blood (ONETOUCH VERIO) test strip Use to test blood glucose three times daily 270 each 3  . levothyroxine (SYNTHROID) 137 MCG tablet Take 1 tablet (137 mcg total) by mouth daily before breakfast. 14 tablet 0  . metFORMIN (GLUCOPHAGE) 1000 MG tablet TAKE 1 TABLET TWICE A DAY WITH A MEAL. 180 tablet 0  . nitroGLYCERIN (NITROSTAT) 0.4 MG SL tablet Place 1 tablet (0.4 mg total) under the tongue every 5 (five) minutes as needed for chest pain (CP or SOB). 60  tablet 12  . ONE TOUCH LANCETS MISC Test blood sugars once daily Dx: E11.9 100 each 3  . risperiDONE (RISPERDAL) 1 MG tablet Take 1 mg by mouth 2 (two) times daily.  2  . simvastatin (ZOCOR) 20 MG tablet TAKE  1 TABLET AT BEDTIME FOR HIGH CHOLESTEROL CONTROL 90 tablet 0  . traZODone (DESYREL) 100 MG tablet Take 1 tablet (100 mg total) by mouth at bedtime. Reported on 09/19/2015 90 tablet 1  . TRULICITY 7.03 EK/3.5CY SOPN INJECT 0.75 MG INTO THE SKIN ONCE A WEEK 6 mL 0  . gabapentin (NEURONTIN) 300 MG capsule Take 1 capsule (300 mg total) by mouth 2 (two) times daily. 180 capsule 1  . [DISCONTINUED] DULoxetine (CYMBALTA) 20 MG capsule Take 20 mg by mouth daily. Samples per Dr Arnoldo Morale     No current facility-administered medications on file prior to visit.     BP 100/70   Temp 97.7 F (36.5 C) (Oral)   Wt 168 lb (76.2 kg)   BMI 30.98 kg/m       Objective:   Physical Exam  Constitutional: She appears well-developed and well-nourished. No distress.  Cardiovascular: Normal rate, regular rhythm, normal heart sounds and intact distal pulses. Exam reveals no gallop and no friction rub.  No murmur heard. Pulmonary/Chest: Effort normal and breath sounds normal. No stridor. No respiratory distress. She has no wheezes. She has no rales. She exhibits no tenderness.  Neurological: She is alert.  Skin: Skin is warm and dry. She is not diaphoretic.  Psychiatric: She has a normal mood and affect. Her behavior is normal. Judgment and thought content normal.  Nursing note and vitals reviewed.         Assessment & Plan:  1. Type 2 diabetes mellitus without complication, without long-term current use of insulin (HCC) - POCT A1C- 5.9- improved  - Congratulated on life style modifications  - Will d/c Invokana at this time  - Continue to work on exercise and diet  - Follow up in 3 months for CPE and recheck or sooner if needed  Dorothyann Peng, NP

## 2018-01-15 ENCOUNTER — Other Ambulatory Visit: Payer: Self-pay | Admitting: Adult Health

## 2018-01-15 NOTE — Telephone Encounter (Signed)
DENIED.  THIS HAS BEEN SENT TO MAIL ORDER

## 2018-01-27 ENCOUNTER — Other Ambulatory Visit: Payer: Self-pay | Admitting: Family Medicine

## 2018-01-27 MED ORDER — GABAPENTIN 300 MG PO CAPS: 300.0000 mg | ORAL_CAPSULE | Freq: Two times a day (BID) | ORAL | 0 refills | Status: DC

## 2018-03-19 ENCOUNTER — Other Ambulatory Visit: Payer: Self-pay | Admitting: Adult Health

## 2018-03-19 DIAGNOSIS — E119 Type 2 diabetes mellitus without complications: Secondary | ICD-10-CM

## 2018-04-02 ENCOUNTER — Other Ambulatory Visit: Payer: Self-pay | Admitting: Adult Health

## 2018-04-02 DIAGNOSIS — Z760 Encounter for issue of repeat prescription: Secondary | ICD-10-CM

## 2018-04-02 DIAGNOSIS — E7800 Pure hypercholesterolemia, unspecified: Secondary | ICD-10-CM

## 2018-04-02 NOTE — Telephone Encounter (Signed)
Sent to the pharmacy by e-scribe for 90 days.  Pt scheduled for cpx on 04/10/18.

## 2018-04-10 ENCOUNTER — Encounter: Payer: Self-pay | Admitting: Adult Health

## 2018-04-10 ENCOUNTER — Ambulatory Visit (INDEPENDENT_AMBULATORY_CARE_PROVIDER_SITE_OTHER): Payer: BLUE CROSS/BLUE SHIELD | Admitting: Adult Health

## 2018-04-10 VITALS — BP 110/70 | Temp 98.0°F | Ht 61.0 in | Wt 176.0 lb

## 2018-04-10 DIAGNOSIS — E782 Mixed hyperlipidemia: Secondary | ICD-10-CM

## 2018-04-10 DIAGNOSIS — E039 Hypothyroidism, unspecified: Secondary | ICD-10-CM | POA: Diagnosis not present

## 2018-04-10 DIAGNOSIS — E119 Type 2 diabetes mellitus without complications: Secondary | ICD-10-CM

## 2018-04-10 DIAGNOSIS — D519 Vitamin B12 deficiency anemia, unspecified: Secondary | ICD-10-CM | POA: Diagnosis not present

## 2018-04-10 DIAGNOSIS — Z0000 Encounter for general adult medical examination without abnormal findings: Secondary | ICD-10-CM | POA: Diagnosis not present

## 2018-04-10 DIAGNOSIS — I10 Essential (primary) hypertension: Secondary | ICD-10-CM | POA: Diagnosis not present

## 2018-04-10 DIAGNOSIS — Z23 Encounter for immunization: Secondary | ICD-10-CM

## 2018-04-10 DIAGNOSIS — E559 Vitamin D deficiency, unspecified: Secondary | ICD-10-CM | POA: Diagnosis not present

## 2018-04-10 DIAGNOSIS — G629 Polyneuropathy, unspecified: Secondary | ICD-10-CM

## 2018-04-10 DIAGNOSIS — Z9884 Bariatric surgery status: Secondary | ICD-10-CM

## 2018-04-10 LAB — LIPID PANEL
CHOL/HDL RATIO: 4
CHOLESTEROL: 103 mg/dL (ref 0–200)
HDL: 29 mg/dL — ABNORMAL LOW (ref >39.00)
LDL CALC: 57 mg/dL (ref 0–99)
NonHDL: 73.97
Triglycerides: 84 mg/dL (ref 0.0–149.0)
VLDL: 16.8 mg/dL (ref 0.0–40.0)

## 2018-04-10 LAB — CBC WITH DIFFERENTIAL/PLATELET
BASOS ABS: 0 10*3/uL (ref 0.0–0.1)
Basophils Relative: 0.4 % (ref 0.0–3.0)
EOS ABS: 0.1 10*3/uL (ref 0.0–0.7)
Eosinophils Relative: 1.1 % (ref 0.0–5.0)
HEMATOCRIT: 40.6 % (ref 36.0–46.0)
HEMOGLOBIN: 13.4 g/dL (ref 12.0–15.0)
LYMPHS ABS: 3 10*3/uL (ref 0.7–4.0)
Lymphocytes Relative: 34.5 % (ref 12.0–46.0)
MCHC: 33 g/dL (ref 30.0–36.0)
MCV: 90.6 fl (ref 78.0–100.0)
MONO ABS: 0.7 10*3/uL (ref 0.1–1.0)
MONOS PCT: 8.3 % (ref 3.0–12.0)
NEUTROS ABS: 4.8 10*3/uL (ref 1.4–7.7)
Neutrophils Relative %: 55.7 % (ref 43.0–77.0)
Platelets: 417 10*3/uL — ABNORMAL HIGH (ref 150.0–400.0)
RBC: 4.48 Mil/uL (ref 3.87–5.11)
RDW: 14.3 % (ref 11.5–15.5)
WBC: 8.7 10*3/uL (ref 4.0–10.5)

## 2018-04-10 LAB — BASIC METABOLIC PANEL
BUN: 9 mg/dL (ref 6–23)
CALCIUM: 9.8 mg/dL (ref 8.4–10.5)
CO2: 30 meq/L (ref 19–32)
CREATININE: 0.67 mg/dL (ref 0.40–1.20)
Chloride: 105 mEq/L (ref 96–112)
GFR: 116.24 mL/min (ref >60.00)
GLUCOSE: 86 mg/dL (ref 70–99)
Potassium: 4.3 mEq/L (ref 3.5–5.1)
Sodium: 145 mEq/L (ref 135–145)

## 2018-04-10 LAB — HEPATIC FUNCTION PANEL
ALBUMIN: 4.1 g/dL (ref 3.5–5.2)
ALK PHOS: 61 U/L (ref 39–117)
ALT: 17 U/L (ref 0–35)
AST: 16 U/L (ref 0–37)
BILIRUBIN TOTAL: 0.4 mg/dL (ref 0.2–1.2)
Bilirubin, Direct: 0.1 mg/dL (ref 0.0–0.3)
Total Protein: 7 g/dL (ref 6.0–8.3)

## 2018-04-10 LAB — HEMOGLOBIN A1C: Hgb A1c MFr Bld: 6.4 % (ref 4.6–6.5)

## 2018-04-10 LAB — FOLATE

## 2018-04-10 LAB — VITAMIN D 25 HYDROXY (VIT D DEFICIENCY, FRACTURES): VITD: 13.94 ng/mL — ABNORMAL LOW (ref 30.00–100.00)

## 2018-04-10 LAB — TSH: TSH: 0.05 u[IU]/mL — ABNORMAL LOW (ref 0.35–4.50)

## 2018-04-10 LAB — VITAMIN B12: Vitamin B-12: 147 pg/mL — ABNORMAL LOW (ref 211–911)

## 2018-04-10 NOTE — Patient Instructions (Signed)
It was great seeing you today   Please work on weight loss through diet and exercise, weight is up 8 pounds since last visit   I will follow up with you regarding your blood work   Go ahead and take Gabapentin three times a day   Dorothyann Peng, NP

## 2018-04-10 NOTE — Progress Notes (Signed)
Subjective:    Patient ID: Alexandra [name], female    DOB: 1960-07-31, 57 y.o.   MRN: [id number]  HPI Patient presents for yearly preventative medicine examination. She is a pleasant 57 year old female who  has a past medical history of Diabetes mellitus ([place]), Fatty liver, GERD (gastroesophageal reflux disease), Hiatal hernia (1998), Hyperlipidemia, Hyperplastic colon polyp, Hypertension, Hyperthyroidism, Hypothyroidism, Rotator cuff tear, right, and Sleep apnea.  DM - Controlled with glipizide 10 mg daily, metformin 1000 mg daily, and Truclity 0.75 mg.  Her visit 3 months ago A1c had dropped from 6.9-5.9 Invokana was discontinued. Today in the office she reports that she went without Trulicty for about 3 weeks, she was able to get a prescription and restarted it this week.  Lab Results  Component Value Date   HGBA1C 5.9 01/09/2018   Hypothyroidism - Takes Synthroid 137 mcg   Hyperlipidemia - Takes Simvastatin 20 mg QHS  Lab Results  Component Value Date   CHOL 100 03/25/2017   HDL 21.20 (L) 03/25/2017   LDLCALC 62 03/25/2017   TRIG 86.0 03/25/2017   CHOLHDL 5 03/25/2017   Essential Hypertension - Takes Ziac 10-6.25  BP Readings from Last 3 Encounters:  04/10/18 110/70  01/09/18 100/70  10/08/17 108/70   Diabetic Neuropathy - Takes Gabapentin 300 mg BID. Report that over the last month she has had increased pain in bilateral lower extremities.   Depression - Controlled with Celexa 40 mg   All immunizations and health maintenance protocols were reviewed with the patient and needed orders were placed. She is due for Pneumovax 23 d/t history of diabetes and seasonal flu injection   Appropriate screening laboratory values were ordered for the patient including screening of hyperlipidemia, renal function and hepatic function.  Medication reconciliation,  past medical history, social history, problem list and allergies were reviewed in detail with the patient  Goals were  established with regard to weight loss, exercise, and  diet in compliance with medications Wt Readings from Last 3 Encounters:  04/10/18 176 lb (79.8 kg)  01/09/18 168 lb (76.2 kg)  10/08/17 176 lb (79.8 kg)   End of life planning was discussed.  She is up to date on routine routine colonoscopy, mammogram, dental and vision exams.   Review of Systems  Constitutional: Negative.   HENT: Negative.   Eyes: Negative.   Respiratory: Negative.   Cardiovascular: Negative.   Gastrointestinal: Negative.   Endocrine: Negative.   Genitourinary: Negative.   Musculoskeletal: Negative.   Skin: Negative.   Allergic/Immunologic: Negative.   Neurological: Negative.   Hematological: Negative.   Psychiatric/Behavioral: Negative.    Past Medical History:  Diagnosis Date  . Diabetes mellitus ([place])   . Fatty liver   . GERD (gastroesophageal reflux disease)    prn med.  . Hiatal hernia 1998  . Hyperlipidemia   . Hyperplastic colon polyp   . Hypertension    new dx. - will start med. 06/28/2011  . Hyperthyroidism   . Hypothyroidism   . Rotator cuff tear, right   . Sleep apnea    prior to gastric bypass - no sleep study since bypass    Social History   Socioeconomic History  . Marital status: Married    Spouse name: Not on file  . Number of children: Not on file  . Years of education: Not on file  . Highest education level: Not on file  Occupational History  . Occupation: unemployed  Social Needs  . Financial resource strain:  Not on file  . Food insecurity:    Worry: Not on file    Inability: Not on file  . Transportation needs:    Medical: Not on file    Non-medical: Not on file  Tobacco Use  . Smoking status: Former Smoker    Packs/day: 1.00    Years: 10.00    Pack years: 10.00    Types: Cigarettes    Last attempt to quit: 07/29/1984    Years since quitting: 33.7  . Smokeless tobacco: Never Used  Substance and Sexual Activity  . Alcohol use: Yes    Comment: occassionally   . Drug use: No  . Sexual activity: Yes  Lifestyle  . Physical activity:    Days per week: Not on file    Minutes per session: Not on file  . Stress: Not on file  Relationships  . Social connections:    Talks on phone: Not on file    Gets together: Not on file    Attends religious service: Not on file    Active member of club or organization: Not on file    Attends meetings of clubs or organizations: Not on file    Relationship status: Not on file  . Intimate partner violence:    Fear of current or ex partner: Not on file    Emotionally abused: Not on file    Physically abused: Not on file    Forced sexual activity: Not on file  Other Topics Concern  . Not on file  Social History Narrative   Married for 24 years    Six children- all live around here. Two daughters live with her   Six grand children    On Disability for mental issues   No pets   Likes to go to movies and out to dinner. She likes to do puzzle books. Likes to read      She eats a healthy diet   She does not exercise           Past Surgical History:  Procedure Laterality Date  . ABDOMINAL HYSTERECTOMY  06/25/2000   left salpingo-oophorectomy  . BACK SURGERY  1997   discetomy, lumbar spinal fusion  . CARDIAC CATHETERIZATION  04/01/2006  . CESAREAN SECTION  1991  . CHOLECYSTECTOMY  1989  . EXPLORATORY LAPAROTOMY  05/08/2000   right salpingo-oophorectomy  . LAMINOTOMY / EXCISION DISK POSTERIOR CERVICAL SPINE  08/10/2009   C7-T1  . 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 SHOULDER ARTHROSCOPY  01/02/2006; 05/31/2004   left 2007; right 2005  . SHOULDER ARTHROSCOPY  07/02/2011   Procedure: ARTHROSCOPY SHOULDER;  Surgeon: Cammie Sickle., MD;  Location: [facility];  Service: Orthopedics;  Laterality: Right;  repair supraspinatus, Debride glenohumeral joint, labrum  . SHOULDER ARTHROSCOPY DISTAL CLAVICLE EXCISION AND OPEN ROTATOR CUFF REPAIR  07/26/2010   right  . THYROID SURGERY  early 2000s     goiter  . TUBAL LIGATION      Family History  Problem Relation Age of Onset  . Coronary artery disease Unknown   . Cancer Paternal Grandfather        Prostate Cancer  . Diabetes Mother   . Hyperlipidemia Mother   . Hypertension Mother   . Thyroid disease Mother   . Rheum arthritis Mother   . COPD Mother     Allergies  Allergen Reactions  . Shrimp Flavor Swelling    Swelling of tongue and rash  . Ace Inhibitors Cough  .  Oxycodone-Acetaminophen Rash    Current Outpatient Medications on File Prior to Visit  Medication Sig Dispense Refill  . aspirin 325 MG tablet Take 1 tablet (325 mg total) by mouth daily. For heart health 30 tablet 3  . bisoprolol-hydrochlorothiazide (ZIAC) 10-6.25 MG tablet Take 1 tablet by mouth daily. 14 tablet 0  . Blood Glucose Monitoring Suppl (ONETOUCH VERIO) w/Device KIT 1 Units by Does not apply route daily. 1 kit 0  . citalopram (CELEXA) 40 MG tablet Take 1 tablet (40 mg total) by mouth daily. Reported on 09/19/2015 90 tablet 1  . gabapentin (NEURONTIN) 300 MG capsule Take 1 capsule (300 mg total) by mouth 2 (two) times daily. 180 capsule 0  . glipiZIDE (GLUCOTROL) 10 MG tablet TAKE 1 TABLET TWICE A DAY BEFORE A MEAL 180 tablet 3  . glucose blood (ONETOUCH VERIO) test strip Use to test blood glucose three times daily 270 each 3  . levothyroxine (SYNTHROID, LEVOTHROID) 137 MCG tablet TAKE 1 TABLET DAILY BEFORE BREAKFAST 90 tablet 0  . metFORMIN (GLUCOPHAGE) 1000 MG tablet TAKE 1 TABLET TWICE A DAY WITH A MEAL. 180 tablet 0  . nitroGLYCERIN (NITROSTAT) 0.4 MG SL tablet Place 1 tablet (0.4 mg total) under the tongue every 5 (five) minutes as needed for chest pain (CP or SOB). 60 tablet 12  . ONE TOUCH LANCETS MISC Test blood sugars once daily Dx: E11.9 100 each 3  . risperiDONE (RISPERDAL) 1 MG tablet Take 1 mg by mouth 2 (two) times daily.  2  . simvastatin (ZOCOR) 20 MG tablet TAKE 1 TABLET AT BEDTIME FOR HIGH CHOLESTEROL CONTROL 90 tablet 0  .  traZODone (DESYREL) 100 MG tablet Take 1 tablet (100 mg total) by mouth at bedtime. Reported on 09/19/2015 90 tablet 1  . TRULICITY 2.45 YK/9.9IP SOPN INJECT 0.75 MG INTO THE SKIN ONCE A WEEK 6 mL 4  . [DISCONTINUED] DULoxetine (CYMBALTA) 20 MG capsule Take 20 mg by mouth daily. Samples per Dr Arnoldo Morale     No current facility-administered medications on file prior to visit.     BP 110/70   Temp 98 F (36.7 C) (Oral)   Ht 5' 1" (1.549 m) Comment: WITHOUT SHOES  Wt 176 lb (79.8 kg)   BMI 33.25 kg/m       Objective:   Physical Exam  Constitutional: She is oriented to person, place, and time. She appears well-developed and well-nourished. No distress.  Obese    HENT:  Head: Normocephalic and atraumatic.  Right Ear: External ear normal.  Left Ear: External ear normal.  Nose: Nose normal.  Mouth/Throat: Oropharynx is clear and moist. No oropharyngeal exudate.  Eyes: Pupils are equal, round, and reactive to light. Conjunctivae and EOM are normal. Right eye exhibits no discharge. Left eye exhibits no discharge.  Neck: Normal range of motion. Neck supple. No JVD present. No tracheal deviation present. No thyromegaly present.  Cardiovascular: Normal rate, regular rhythm, normal heart sounds and intact distal pulses. Exam reveals no gallop and no friction rub.  No murmur heard. Pulmonary/Chest: Effort normal and breath sounds normal. No stridor. No respiratory distress. She has no wheezes. She has no rales. She exhibits no tenderness.  Abdominal: Soft. Bowel sounds are normal. She exhibits no distension and no mass. There is no tenderness. There is no rebound and no guarding. No hernia.  Genitourinary:  Genitourinary Comments: Deferred    Musculoskeletal: Normal range of motion. She exhibits no edema, tenderness or deformity.  Lymphadenopathy:    She has no  cervical adenopathy.  Neurological: She is alert and oriented to person, place, and time. She displays normal reflexes. No cranial  nerve deficit or sensory deficit. She exhibits normal muscle tone. Coordination normal.  Skin: Skin is warm and dry. Capillary refill takes less than 2 seconds. No rash noted. She is not diaphoretic. No erythema. No pallor.  Psychiatric: She has a normal mood and affect. Her behavior is normal. Judgment and thought content normal.  Nursing note and vitals reviewed.     Assessment & Plan:  1. Routine general medical examination at a health care facility - Encouraged heart healthy diet and exercise  - Follow up in one year for CPE  - Follow up sooner if needed  - Basic metabolic panel - CBC with Differential/Platelet - Hemoglobin A1c - Hepatic function panel - Lipid panel - TSH  2. Type 2 diabetes mellitus without complication, without long-term current use of insulin (HCC) - Consider titrating medications  - Follow up in three months  - Basic metabolic panel - CBC with Differential/Platelet - Hemoglobin A1c - Hepatic function panel - Lipid panel - TSH  3. Essential hypertension - Well controlled.  - No change in medications  - Basic metabolic panel - CBC with Differential/Platelet - Hemoglobin A1c - Hepatic function panel - Lipid panel - TSH  4. Hypothyroidism, unspecified type -Consider increasing synthroid  - Basic metabolic panel - CBC with Differential/Platelet - Hemoglobin A1c - Hepatic function panel - Lipid panel - TSH  5. Mixed hyperlipidemia - Consider increasing statin  - Basic metabolic panel - CBC with Differential/Platelet - Hemoglobin A1c - Hepatic function panel - Lipid panel - TSH  6. Bariatric surgery status  - Vitamin B12 - Folate - Vitamin B1 - Iron, TIBC and Ferritin Panel - Vitamin D, 25-hydroxy  7. Need for influenza vaccination  - Flu Vaccine QUAD 6+ mos PF IM (Fluarix Quad PF)  8. Need for vaccination against Streptococcus pneumoniae  - Pneumococcal polysaccharide vaccine 23-valent greater than or equal to 2yo  subcutaneous/IM  9. Neuropathy - Increase Gabapentin to 300 mg TID   Dorothyann Peng, NP

## 2018-04-14 LAB — VITAMIN B1

## 2018-04-14 LAB — IRON,TIBC AND FERRITIN PANEL
%SAT: 26 % (ref 16–45)
Ferritin: 45 ng/mL (ref 16–232)
IRON: 94 ug/dL (ref 45–160)
TIBC: 356 ug/dL (ref 250–450)

## 2018-05-29 ENCOUNTER — Ambulatory Visit (INDEPENDENT_AMBULATORY_CARE_PROVIDER_SITE_OTHER): Payer: BLUE CROSS/BLUE SHIELD | Admitting: Family Medicine

## 2018-05-29 ENCOUNTER — Encounter: Payer: Self-pay | Admitting: Family Medicine

## 2018-05-29 VITALS — BP 124/76 | HR 82 | Temp 98.0°F | Wt 184.0 lb

## 2018-05-29 DIAGNOSIS — W108XXA Fall (on) (from) other stairs and steps, initial encounter: Secondary | ICD-10-CM | POA: Diagnosis not present

## 2018-05-29 DIAGNOSIS — M545 Low back pain, unspecified: Secondary | ICD-10-CM

## 2018-05-29 DIAGNOSIS — G44319 Acute post-traumatic headache, not intractable: Secondary | ICD-10-CM

## 2018-05-29 MED ORDER — IBUPROFEN 600 MG PO TABS: 600.0000 mg | ORAL_TABLET | Freq: Three times a day (TID) | ORAL | 0 refills | Status: DC | PRN

## 2018-05-29 NOTE — Progress Notes (Signed)
Subjective:    Patient ID: Alexandra [name], female    DOB: 1961/03/14, 57 y.o.   MRN: [id number]  No chief complaint on file.   HPI Patient was seen today for acute concern.  Pt fell last night going down the stairs in her home when her foot slipped.  Pt laid on the floor for a moment before getting up.  No LOC.  Pt noted L low back pain and a HA.  Tylenol last night helped some.  She took Ibuprofen 800 mg this am, now with a dull HA.   Pt has a h/o lumbar spinal fusion done 20+ yrs ago.  Also with h/o gastric bypass.  Pt has not taken ASA 325 mg in a while as she ran out.  Not on any other blood thinners. Past Medical History:  Diagnosis Date  . Diabetes mellitus ([place])   . Fatty liver   . GERD (gastroesophageal reflux disease)    prn med.  . Hiatal hernia 1998  . Hyperlipidemia   . Hyperplastic colon polyp   . Hypertension    new dx. - will start med. 06/28/2011  . Hyperthyroidism   . Hypothyroidism   . Rotator cuff tear, right   . Sleep apnea    prior to gastric bypass - no sleep study since bypass    Allergies  Allergen Reactions  . Shrimp Flavor Swelling    Swelling of tongue and rash  . Ace Inhibitors Cough    ROS General: Denies fever, chills, night sweats, changes in weight, changes in appetite HEENT: Denies ear pain, changes in vision, rhinorrhea, sore throat  +HA CV: Denies CP, palpitations, SOB, orthopnea Pulm: Denies SOB, cough, wheezing GI: Denies abdominal pain, nausea, vomiting, diarrhea, constipation GU: Denies dysuria, hematuria, frequency, vaginal discharge Msk: Denies muscle cramps, joint pains  +low back pain Neuro: Denies weakness, numbness, tingling Skin: Denies rashes, bruising Psych: Denies depression, anxiety, hallucinations    Objective:    Blood pressure 124/76, pulse 82, temperature 98 F (36.7 C), temperature source Oral, weight 184 lb (83.5 kg), SpO2 96 %.   Gen. Pleasant, well-nourished, in no distress, normal affect   HEENT:  [place]/AT, face symmetric, conjunctiva clear, no scleral icterus, pupils small 3 mm b/l, EOMI no nystagmus, nares patent without drainage, neck enlarged. Lungs: no accessory muscle use, CTAB, no wheezes or rales Cardiovascular: RRR, no m/r/g, no peripheral edema Musculoskeletal: No TTP of cervical, thoracic, or lumbar spine.  TTP of L lateral low back. No deformities, no cyanosis or clubbing, normal tone Neuro:  A&Ox3, CN II-XII intact, normal gait-slightly slower pace. Skin:  Warm, no lesions/ rash.  Well healed surgical incision of lumbar and cervical spine.  Wt Readings from Last 3 Encounters:  05/29/18 184 lb (83.5 kg)  04/10/18 176 lb (79.8 kg)  01/09/18 168 lb (76.2 kg)    Lab Results  Component Value Date   WBC 8.7 04/10/2018   HGB 13.4 04/10/2018   HCT 40.6 04/10/2018   PLT 417.0 (H) 04/10/2018   GLUCOSE 86 04/10/2018   CHOL 103 04/10/2018   TRIG 84.0 04/10/2018   HDL 29.00 (L) 04/10/2018   LDLCALC 57 04/10/2018   ALT 17 04/10/2018   AST 16 04/10/2018   NA 145 04/10/2018   K 4.3 04/10/2018   CL 105 04/10/2018   CREATININE 0.67 04/10/2018   BUN 9 04/10/2018   CO2 30 04/10/2018   TSH 0.05 (L) 04/10/2018   INR 0.87 01/16/2013   HGBA1C 6.4 04/10/2018   MICROALBUR  1.1 03/25/2017    Assessment/Plan:  Acute left-sided low back pain without sciatica  -Supportive care advised at this time.   Given location of discomfort will wait on imaging.  If pt develops midline spine pain will proceed with imaging given h/o spinal fusion.  -pt advised to use Tylenol first for any pain/discomfort given h/o gastric bypass and gastritis. -if tylenol does not help, then can try ibuprofen a few hours later.  Pt advised not to take NSAIDs on an empty stomach -advised likely to be sore for 4-6 wks - Plan: ibuprofen (ADVIL,MOTRIN) 600 MG tablet  Acute post-traumatic headache, not intractable  -supportive care -no imaging obtained based on exam. - Plan: ibuprofen (ADVIL,MOTRIN) 600 MG  tablet  Fall (on) (from) other stairs and steps, initial encounter  F/u prn  Grier Mitts, MD

## 2018-05-29 NOTE — Patient Instructions (Signed)

## 2018-06-01 ENCOUNTER — Other Ambulatory Visit: Payer: Self-pay | Admitting: Adult Health

## 2018-06-01 DIAGNOSIS — Z1231 Encounter for screening mammogram for malignant neoplasm of breast: Secondary | ICD-10-CM

## 2018-07-02 ENCOUNTER — Encounter: Payer: Self-pay | Admitting: Adult Health

## 2018-07-02 DIAGNOSIS — E7800 Pure hypercholesterolemia, unspecified: Secondary | ICD-10-CM

## 2018-07-02 DIAGNOSIS — Z760 Encounter for issue of repeat prescription: Secondary | ICD-10-CM

## 2018-07-02 MED ORDER — LEVOTHYROXINE SODIUM 137 MCG PO TABS: 137.0000 ug | ORAL_TABLET | Freq: Every day | ORAL | 2 refills | Status: DC

## 2018-07-02 MED ORDER — GABAPENTIN 300 MG PO CAPS: 300.0000 mg | ORAL_CAPSULE | Freq: Three times a day (TID) | ORAL | 1 refills | Status: DC

## 2018-07-02 MED ORDER — BISOPROLOL-HYDROCHLOROTHIAZIDE 10-6.25 MG PO TABS: 1.0000 | ORAL_TABLET | Freq: Every day | ORAL | 2 refills | Status: DC

## 2018-07-03 LAB — HM DIABETES EYE EXAM

## 2018-07-03 MED ORDER — SIMVASTATIN 20 MG PO TABS: 20.0000 mg | ORAL_TABLET | Freq: Every day | ORAL | 2 refills | Status: DC

## 2018-07-03 NOTE — Addendum Note (Signed)
Addended by: Miles Costain T on: 07/03/2018 07:08 AM   Modules accepted: Orders

## 2018-07-10 ENCOUNTER — Ambulatory Visit (INDEPENDENT_AMBULATORY_CARE_PROVIDER_SITE_OTHER): Payer: BLUE CROSS/BLUE SHIELD | Admitting: Adult Health

## 2018-07-10 ENCOUNTER — Encounter: Payer: Self-pay | Admitting: Adult Health

## 2018-07-10 VITALS — BP 144/90 | Temp 97.8°F | Wt 185.0 lb

## 2018-07-10 DIAGNOSIS — E1140 Type 2 diabetes mellitus with diabetic neuropathy, unspecified: Secondary | ICD-10-CM

## 2018-07-10 LAB — POCT GLYCOSYLATED HEMOGLOBIN (HGB A1C): HbA1c, POC (controlled diabetic range): 6.3 % (ref 0.0–7.0)

## 2018-07-10 MED ORDER — FLUTICASONE PROPIONATE 50 MCG/ACT NA SUSP: 2.0000 | Freq: Every day | NASAL | 0 refills | Status: DC

## 2018-07-10 MED ORDER — FLUTICASONE PROPIONATE 50 MCG/ACT NA SUSP: 2.0000 | Freq: Every day | NASAL | 6 refills | Status: DC

## 2018-07-10 MED ORDER — DULAGLUTIDE 0.75 MG/0.5ML ~~LOC~~ SOAJ: SUBCUTANEOUS | 4 refills | Status: DC

## 2018-07-10 NOTE — Progress Notes (Signed)
Subjective:    Patient ID: Alexandra [name], female    DOB: 1961-03-24, 57 y.o.   MRN: [id number]  HPI 57 year old female who  has a past medical history of Diabetes mellitus ([place]), Fatty liver, GERD (gastroesophageal reflux disease), Hiatal hernia (1998), Hyperlipidemia, Hyperplastic colon polyp, Hypertension, Hyperthyroidism, Hypothyroidism, Rotator cuff tear, right, and Sleep apnea.  She presents to the office today for follow-up regarding diabetes.  She is currently maintained on glipizide 10 mg twice daily metformin 1000 mg daily and Trulicity 6.78 mg weekly. She denies hypoglycemic episodes but does not check her blood sugar at home.   She has been working on diet but not exercising on a routine basis.   Her biggest complaint is that of worsening neuropathy. She is taking gabapentin 300 mg TID currently.   Lab Results  Component Value Date   HGBA1C 6.4 04/10/2018   Wt Readings from Last 3 Encounters:  07/10/18 185 lb (83.9 kg)  05/29/18 184 lb (83.5 kg)  04/10/18 176 lb (79.8 kg)   Review of Systems See HPI   Past Medical History:  Diagnosis Date  . Diabetes mellitus ([place])   . Fatty liver   . GERD (gastroesophageal reflux disease)    prn med.  . Hiatal hernia 1998  . Hyperlipidemia   . Hyperplastic colon polyp   . Hypertension    new dx. - will start med. 06/28/2011  . Hyperthyroidism   . Hypothyroidism   . Rotator cuff tear, right   . Sleep apnea    prior to gastric bypass - no sleep study since bypass    Social History   Socioeconomic History  . Marital status: Married    Spouse name: Not on file  . Number of children: Not on file  . Years of education: Not on file  . Highest education level: Not on file  Occupational History  . Occupation: unemployed  Social Needs  . Financial resource strain: Not on file  . Food insecurity:    Worry: Not on file    Inability: Not on file  . Transportation needs:    Medical: Not on file    Non-medical: Not  on file  Tobacco Use  . Smoking status: Former Smoker    Packs/day: 1.00    Years: 10.00    Pack years: 10.00    Types: Cigarettes    Last attempt to quit: 07/29/1984    Years since quitting: 33.9  . Smokeless tobacco: Never Used  Substance and Sexual Activity  . Alcohol use: Yes    Comment: occassionally  . Drug use: No  . Sexual activity: Yes  Lifestyle  . Physical activity:    Days per week: Not on file    Minutes per session: Not on file  . Stress: Not on file  Relationships  . Social connections:    Talks on phone: Not on file    Gets together: Not on file    Attends religious service: Not on file    Active member of club or organization: Not on file    Attends meetings of clubs or organizations: Not on file    Relationship status: Not on file  . Intimate partner violence:    Fear of current or ex partner: Not on file    Emotionally abused: Not on file    Physically abused: Not on file    Forced sexual activity: Not on file  Other Topics Concern  . Not on file  Social History  Narrative   Married for 24 years    Six children- all live around here. Two daughters live with her   Six grand children    On Disability for mental issues   No pets   Likes to go to movies and out to dinner. She likes to do puzzle books. Likes to read      She eats a healthy diet   She does not exercise           Past Surgical History:  Procedure Laterality Date  . ABDOMINAL HYSTERECTOMY  06/25/2000   left salpingo-oophorectomy  . BACK SURGERY  1997   discetomy, lumbar spinal fusion  . CARDIAC CATHETERIZATION  04/01/2006  . CESAREAN SECTION  1991  . CHOLECYSTECTOMY  1989  . EXPLORATORY LAPAROTOMY  05/08/2000   right salpingo-oophorectomy  . LAMINOTOMY / EXCISION DISK POSTERIOR CERVICAL SPINE  08/10/2009   C7-T1  . 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 SHOULDER ARTHROSCOPY  01/02/2006; 05/31/2004   left 2007; right 2005  . SHOULDER ARTHROSCOPY  07/02/2011   Procedure: ARTHROSCOPY  SHOULDER;  Surgeon: Cammie Sickle., MD;  Location: [facility];  Service: Orthopedics;  Laterality: Right;  repair supraspinatus, Debride glenohumeral joint, labrum  . SHOULDER ARTHROSCOPY DISTAL CLAVICLE EXCISION AND OPEN ROTATOR CUFF REPAIR  07/26/2010   right  . THYROID SURGERY  early 2000s   goiter  . TUBAL LIGATION      Family History  Problem Relation Age of Onset  . Coronary artery disease Unknown   . Cancer Paternal Grandfather        Prostate Cancer  . Diabetes Mother   . Hyperlipidemia Mother   . Hypertension Mother   . Thyroid disease Mother   . Rheum arthritis Mother   . COPD Mother     Allergies  Allergen Reactions  . Shrimp Flavor Swelling    Swelling of tongue and rash  . Ace Inhibitors Cough    Current Outpatient Medications on File Prior to Visit  Medication Sig Dispense Refill  . aspirin 325 MG tablet Take 1 tablet (325 mg total) by mouth daily. For heart health 30 tablet 3  . bisoprolol-hydrochlorothiazide (ZIAC) 10-6.25 MG tablet Take 1 tablet by mouth daily. 90 tablet 2  . Blood Glucose Monitoring Suppl (ONETOUCH VERIO) w/Device KIT 1 Units by Does not apply route daily. 1 kit 0  . citalopram (CELEXA) 40 MG tablet Take 1 tablet (40 mg total) by mouth daily. Reported on 09/19/2015 90 tablet 1  . gabapentin (NEURONTIN) 300 MG capsule Take 1 capsule (300 mg total) by mouth 3 (three) times daily. 270 capsule 1  . glipiZIDE (GLUCOTROL) 10 MG tablet TAKE 1 TABLET TWICE A DAY BEFORE A MEAL 180 tablet 3  . glucose blood (ONETOUCH VERIO) test strip Use to test blood glucose three times daily 270 each 3  . ibuprofen (ADVIL,MOTRIN) 600 MG tablet Take 1 tablet (600 mg total) by mouth every 8 (eight) hours as needed. 20 tablet 0  . levothyroxine (SYNTHROID, LEVOTHROID) 137 MCG tablet Take 1 tablet (137 mcg total) by mouth daily before breakfast. 90 tablet 2  . metFORMIN (GLUCOPHAGE) 1000 MG tablet TAKE 1 TABLET TWICE A DAY WITH A MEAL. (Patient taking  differently: Take 1,000 mg by mouth daily with breakfast. TAKE 1 TABLET TWICE A DAY WITH A MEAL.) 180 tablet 0  . nitroGLYCERIN (NITROSTAT) 0.4 MG SL tablet Place 1 tablet (0.4 mg total) under the tongue every 5 (five) minutes as needed for  chest pain (CP or SOB). 60 tablet 12  . ONE TOUCH LANCETS MISC Test blood sugars once daily Dx: E11.9 100 each 3  . risperiDONE (RISPERDAL) 1 MG tablet Take 1 mg by mouth 2 (two) times daily.  2  . simvastatin (ZOCOR) 20 MG tablet Take 1 tablet (20 mg total) by mouth at bedtime. 90 tablet 2  . traZODone (DESYREL) 100 MG tablet Take 1 tablet (100 mg total) by mouth at bedtime. Reported on 09/19/2015 90 tablet 1  . TRULICITY 4.45 HQ/6.0QN SOPN INJECT 0.75 MG INTO THE SKIN ONCE A WEEK 6 mL 4  . [DISCONTINUED] DULoxetine (CYMBALTA) 20 MG capsule Take 20 mg by mouth daily. Samples per Dr Arnoldo Morale     No current facility-administered medications on file prior to visit.     BP (!) 144/90   Temp 97.8 F (36.6 C)   Wt 185 lb (83.9 kg)   BMI 34.96 kg/m       Objective:   Physical Exam Vitals signs and nursing note reviewed.  Constitutional:      Appearance: Normal appearance. She is obese.  Cardiovascular:     Rate and Rhythm: Normal rate and regular rhythm.  Pulmonary:     Effort: Pulmonary effort is normal.     Breath sounds: Normal breath sounds.  Abdominal:     General: Bowel sounds are normal.     Palpations: Abdomen is soft.  Skin:    General: Skin is warm and dry.  Neurological:     General: No focal deficit present.     Mental Status: She is alert and oriented to person, place, and time.  Psychiatric:        Mood and Affect: Mood normal.        Behavior: Behavior normal.        Thought Content: Thought content normal.        Judgment: Judgment normal.        Assessment & Plan:  1. Type 2 diabetes mellitus with diabetic neuropathy, without long-term current use of insulin ([facility])  - POCT A1C- 6.3  - Dulaglutide (TRULICITY) 9.98  XA/1.5UN SOPN; INJECT 0.75 MG INTO THE SKIN ONCE A WEEK  Dispense: 6 mL; Refill: 4 - Will have her increase Gabapentin to 600 mg TID - she can use her current supply and let us know how she does  - 6 month follow up - A1c has been stable  Dorothyann Peng, NP

## 2018-07-13 ENCOUNTER — Ambulatory Visit
Admission: RE | Admit: 2018-07-13 | Discharge: 2018-07-13 | Disposition: A | Discharge status: Home / Self Care | Payer: BLUE CROSS/BLUE SHIELD | Source: Ambulatory Visit | Attending: Adult Health | Admitting: Adult Health

## 2018-07-13 DIAGNOSIS — Z1231 Encounter for screening mammogram for malignant neoplasm of breast: Secondary | ICD-10-CM

## 2018-07-15 ENCOUNTER — Encounter: Payer: Self-pay | Admitting: Family Medicine

## 2018-07-16 ENCOUNTER — Encounter: Payer: Self-pay | Admitting: Adult Health

## 2018-07-17 MED ORDER — FLUTICASONE PROPIONATE 50 MCG/ACT NA SUSP: 2.0000 | Freq: Every day | NASAL | 1 refills | Status: DC

## 2018-07-28 ENCOUNTER — Encounter: Payer: Self-pay | Admitting: Adult Health

## 2018-07-28 ENCOUNTER — Other Ambulatory Visit: Payer: Self-pay | Admitting: Adult Health

## 2018-07-28 NOTE — Telephone Encounter (Signed)
Sent to the pharmacy by e-scribe. 

## 2018-09-04 ENCOUNTER — Ambulatory Visit (INDEPENDENT_AMBULATORY_CARE_PROVIDER_SITE_OTHER): Payer: BLUE CROSS/BLUE SHIELD | Admitting: Adult Health

## 2018-09-04 ENCOUNTER — Encounter: Payer: Self-pay | Admitting: Adult Health

## 2018-09-04 VITALS — BP 138/90 | Temp 98.9°F | Wt 185.0 lb

## 2018-09-04 DIAGNOSIS — J069 Acute upper respiratory infection, unspecified: Secondary | ICD-10-CM | POA: Diagnosis not present

## 2018-09-04 MED ORDER — HYDROCODONE-HOMATROPINE 5-1.5 MG/5ML PO SYRP: 5.0000 mL | ORAL_SOLUTION | Freq: Three times a day (TID) | ORAL | 0 refills | Status: DC | PRN

## 2018-09-04 MED ORDER — AMOXICILLIN 875 MG PO TABS: 875.0000 mg | ORAL_TABLET | Freq: Two times a day (BID) | ORAL | 0 refills | Status: AC

## 2018-09-04 NOTE — Progress Notes (Signed)
Subjective:    Patient ID: Alexandra [name], female    DOB: [birth year], 58 y.o.   MRN: [id number]  URI   This is a recurrent problem. The current episode started 1 to 4 weeks ago (3 weeks). The problem has been waxing and waning. Associated symptoms include congestion, coughing (productive), ear pain, headaches, a plugged ear sensation, rhinorrhea, sinus pain and a sore throat. Pertinent negatives include no wheezing. She has tried antihistamine, decongestant and inhaler use for the symptoms. The treatment provided no relief.      Review of Systems  Constitutional: Positive for appetite change, chills and fatigue. Negative for diaphoresis and fever.  HENT: Positive for congestion, ear pain, rhinorrhea, sinus pressure, sinus pain and sore throat.   Respiratory: Positive for cough (productive). Negative for chest tightness, shortness of breath and wheezing.   Cardiovascular: Negative.   Musculoskeletal: Negative.   Skin: Negative.   Neurological: Positive for headaches.  Psychiatric/Behavioral: Negative.    Past Medical History:  Diagnosis Date  . Diabetes mellitus ([place])   . Fatty liver   . GERD (gastroesophageal reflux disease)    prn med.  . Hiatal hernia 1998  . Hyperlipidemia   . Hyperplastic colon polyp   . Hypertension    new dx. - will start med. 06/28/2011  . Hyperthyroidism   . Hypothyroidism   . Rotator cuff tear, right   . Sleep apnea    prior to gastric bypass - no sleep study since bypass    Social History   Socioeconomic History  . Marital status: Married    Spouse name: Not on file  . Number of children: Not on file  . Years of education: Not on file  . Highest education level: Not on file  Occupational History  . Occupation: unemployed  Social Needs  . Financial resource strain: Not on file  . Food insecurity:    Worry: Not on file    Inability: Not on file  . Transportation needs:    Medical: Not on file    Non-medical: Not on file    Tobacco Use  . Smoking status: Former Smoker    Packs/day: 1.00    Years: 10.00    Pack years: 10.00    Types: Cigarettes    Last attempt to quit: 07/29/1984    Years since quitting: 34.1  . Smokeless tobacco: Never Used  Substance and Sexual Activity  . Alcohol use: Yes    Comment: occassionally  . Drug use: No  . Sexual activity: Yes  Lifestyle  . Physical activity:    Days per week: Not on file    Minutes per session: Not on file  . Stress: Not on file  Relationships  . Social connections:    Talks on phone: Not on file    Gets together: Not on file    Attends religious service: Not on file    Active member of club or organization: Not on file    Attends meetings of clubs or organizations: Not on file    Relationship status: Not on file  . Intimate partner violence:    Fear of current or ex partner: Not on file    Emotionally abused: Not on file    Physically abused: Not on file    Forced sexual activity: Not on file  Other Topics Concern  . Not on file  Social History Narrative   Married for 24 years    Six children- all live around here. Two daughters live  with her   Six grand children    On Disability for mental issues   No pets   Likes to go to movies and out to dinner. She likes to do puzzle books. Likes to read      She eats a healthy diet   She does not exercise           Past Surgical History:  Procedure Laterality Date  . ABDOMINAL HYSTERECTOMY  06/25/2000   left salpingo-oophorectomy  . BACK SURGERY  1997   discetomy, lumbar spinal fusion  . CARDIAC CATHETERIZATION  04/01/2006  . CESAREAN SECTION  1991  . CHOLECYSTECTOMY  1989  . EXPLORATORY LAPAROTOMY  05/08/2000   right salpingo-oophorectomy  . LAMINOTOMY / EXCISION DISK POSTERIOR CERVICAL SPINE  08/10/2009   C7-T1  . 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 SHOULDER ARTHROSCOPY  01/02/2006; 05/31/2004   left 2007; right 2005  . SHOULDER ARTHROSCOPY  07/02/2011   Procedure: ARTHROSCOPY SHOULDER;   Surgeon: Cammie Sickle., MD;  Location: [facility];  Service: Orthopedics;  Laterality: Right;  repair supraspinatus, Debride glenohumeral joint, labrum  . SHOULDER ARTHROSCOPY DISTAL CLAVICLE EXCISION AND OPEN ROTATOR CUFF REPAIR  07/26/2010   right  . THYROID SURGERY  early 2000s   goiter  . TUBAL LIGATION      Family History  Problem Relation Age of Onset  . Coronary artery disease Other   . Cancer Paternal Grandfather        Prostate Cancer  . Diabetes Mother   . Hyperlipidemia Mother   . Hypertension Mother   . Thyroid disease Mother   . Rheum arthritis Mother   . COPD Mother     Allergies  Allergen Reactions  . Shrimp Flavor Swelling    Swelling of tongue and rash  . Ace Inhibitors Cough    Current Outpatient Medications on File Prior to Visit  Medication Sig Dispense Refill  . aspirin 325 MG tablet Take 1 tablet (325 mg total) by mouth daily. For heart health 30 tablet 3  . bisoprolol-hydrochlorothiazide (ZIAC) 10-6.25 MG tablet Take 1 tablet by mouth daily. 90 tablet 2  . Blood Glucose Monitoring Suppl (ONETOUCH VERIO) w/Device KIT 1 Units by Does not apply route daily. 1 kit 0  . citalopram (CELEXA) 40 MG tablet Take 1 tablet (40 mg total) by mouth daily. Reported on 09/19/2015 90 tablet 1  . Dulaglutide (TRULICITY) 4.16 SA/6.3KZ SOPN INJECT 0.75 MG INTO THE SKIN ONCE A WEEK 6 mL 4  . fluticasone (FLONASE ALLERGY RELIEF) 50 MCG/ACT nasal spray Place 2 sprays into both nostrils daily. 48 g 1  . gabapentin (NEURONTIN) 300 MG capsule Take 1 capsule (300 mg total) by mouth 3 (three) times daily. (Patient taking differently: Take 600 mg by mouth 3 (three) times daily. ) 270 capsule 1  . glipiZIDE (GLUCOTROL) 10 MG tablet TAKE 1 TABLET TWICE A DAY BEFORE A MEAL 180 tablet 3  . ibuprofen (ADVIL,MOTRIN) 600 MG tablet Take 1 tablet (600 mg total) by mouth every 8 (eight) hours as needed. 20 tablet 0  . levothyroxine (SYNTHROID, LEVOTHROID) 137 MCG tablet  Take 1 tablet (137 mcg total) by mouth daily before breakfast. 90 tablet 2  . metFORMIN (GLUCOPHAGE) 1000 MG tablet TAKE 1 TABLET TWICE A DAY WITH A MEAL. (Patient taking differently: Take 1,000 mg by mouth daily with breakfast. TAKE 1 TABLET TWICE A DAY WITH A MEAL.) 180 tablet 0  . nitroGLYCERIN (NITROSTAT) 0.4 MG SL tablet Place 1  tablet (0.4 mg total) under the tongue every 5 (five) minutes as needed for chest pain (CP or SOB). 60 tablet 12  . ONE TOUCH LANCETS MISC Test blood sugars once daily Dx: E11.9 100 each 3  . ONETOUCH VERIO test strip USE TO TEST BLOOD GLUCOSE THREE TIMES A DAY 300 each 3  . risperiDONE (RISPERDAL) 1 MG tablet Take 1 mg by mouth 2 (two) times daily.  2  . simvastatin (ZOCOR) 20 MG tablet Take 1 tablet (20 mg total) by mouth at bedtime. 90 tablet 2  . traZODone (DESYREL) 100 MG tablet Take 1 tablet (100 mg total) by mouth at bedtime. Reported on 09/19/2015 90 tablet 1   No current facility-administered medications on file prior to visit.     BP 138/90   Temp 98.9 F (37.2 C)   Wt 185 lb (83.9 kg)   BMI 34.96 kg/m       Objective:   Physical Exam Vitals signs and nursing note reviewed.  Constitutional:      Appearance: Normal appearance. She is normal weight.  HENT:     Right Ear: Tympanic membrane, ear canal and external ear normal. No middle ear effusion. There is no impacted cerumen. Tympanic membrane is not erythematous or bulging.     Left Ear: External ear normal. A middle ear effusion is present. There is no impacted cerumen. Tympanic membrane is bulging. Tympanic membrane is not erythematous.     Nose: No congestion or rhinorrhea.     Right Turbinates: Enlarged and swollen.     Left Turbinates: Enlarged and swollen.     Right Sinus: Maxillary sinus tenderness and frontal sinus tenderness present.     Left Sinus: Maxillary sinus tenderness and frontal sinus tenderness present.  Cardiovascular:     Pulses: Normal pulses.     Heart sounds: Normal  heart sounds.  Pulmonary:     Effort: Pulmonary effort is normal.     Breath sounds: Normal breath sounds.  Neurological:     Mental Status: She is alert.       Assessment & Plan:  1. Upper respiratory tract infection, unspecified type  - amoxicillin (AMOXIL) 875 MG tablet; Take 1 tablet (875 mg total) by mouth 2 (two) times daily for 7 days.  Dispense: 14 tablet; Refill: 0 - HYDROcodone-homatropine (HYCODAN) 5-1.5 MG/5ML syrup; Take 5 mLs by mouth every 8 (eight) hours as needed for cough.  Dispense: 120 mL; Refill: 0 - Delsym/mucinex for cough during the day  - Rest and stay hydrated over the weekend  - Follow up if no improvement in the next 2-3 days   Dorothyann Peng, NP

## 2018-10-21 ENCOUNTER — Other Ambulatory Visit: Payer: Self-pay | Admitting: Adult Health

## 2018-10-22 NOTE — Telephone Encounter (Signed)
Sent to the pharmacy by e-scribe. 

## 2018-10-28 ENCOUNTER — Other Ambulatory Visit: Payer: Self-pay | Admitting: Adult Health

## 2018-10-28 DIAGNOSIS — E118 Type 2 diabetes mellitus with unspecified complications: Secondary | ICD-10-CM

## 2018-10-28 DIAGNOSIS — Z760 Encounter for issue of repeat prescription: Secondary | ICD-10-CM

## 2018-10-29 MED ORDER — GLIPIZIDE 10 MG PO TABS: ORAL_TABLET | ORAL | 0 refills | Status: DC

## 2018-10-29 MED ORDER — METFORMIN HCL 1000 MG PO TABS: 1000.0000 mg | ORAL_TABLET | Freq: Every day | ORAL | 0 refills | Status: DC

## 2018-10-29 NOTE — Telephone Encounter (Signed)
Sent to the pharmacy by e-scribe. 

## 2019-01-12 ENCOUNTER — Other Ambulatory Visit: Payer: Self-pay

## 2019-01-12 ENCOUNTER — Ambulatory Visit (INDEPENDENT_AMBULATORY_CARE_PROVIDER_SITE_OTHER): Payer: BC Managed Care – PPO | Admitting: Adult Health

## 2019-01-12 ENCOUNTER — Encounter: Payer: Self-pay | Admitting: Adult Health

## 2019-01-12 DIAGNOSIS — E039 Hypothyroidism, unspecified: Secondary | ICD-10-CM | POA: Diagnosis not present

## 2019-01-12 DIAGNOSIS — E7800 Pure hypercholesterolemia, unspecified: Secondary | ICD-10-CM

## 2019-01-12 DIAGNOSIS — E1140 Type 2 diabetes mellitus with diabetic neuropathy, unspecified: Secondary | ICD-10-CM

## 2019-01-12 MED ORDER — GLIPIZIDE 10 MG PO TABS: ORAL_TABLET | ORAL | 0 refills | Status: DC

## 2019-01-12 MED ORDER — GABAPENTIN 400 MG PO CAPS: 800.0000 mg | ORAL_CAPSULE | Freq: Three times a day (TID) | ORAL | 1 refills | Status: DC

## 2019-01-12 MED ORDER — SIMVASTATIN 20 MG PO TABS: 20.0000 mg | ORAL_TABLET | Freq: Every day | ORAL | 2 refills | Status: DC

## 2019-01-12 MED ORDER — ONETOUCH VERIO VI STRP: ORAL_STRIP | 3 refills | Status: DC

## 2019-01-12 MED ORDER — LEVOTHYROXINE SODIUM 137 MCG PO TABS: 137.0000 ug | ORAL_TABLET | Freq: Every day | ORAL | 2 refills | Status: DC

## 2019-01-12 NOTE — Progress Notes (Signed)
Virtual Visit via Video Note  I connected with Alexandra [name] on 01/12/19 at  9:00 AM EDT by a video enabled telemedicine application and verified that I am speaking with the correct person using two identifiers.  Location patient: home Location provider:work or home office Persons participating in the virtual visit: patient, provider  I discussed the limitations of evaluation and management by telemedicine and the availability of in person appointments. The patient expressed understanding and agreed to proceed.   HPI: 58 year old female who is being evaluated today for 18-monthfollow-up regarding diabetes.  She is currently maintained on Trulicity 04.09mg, glipizide 10 mg twice daily, and metformin 1000 mg daily at breakfast.   Her last A1c in December 2019 was 6.3  She has not been checking her blood sugars on a routine basis. She checked this morning and her blood sugar was 133. She has infrequent episodes of hypoglycemia. She is trying to eat a heart healthy diet but is not exercising on a routine basis.   Her biggest complaint is that of neuropathy pain. Back in December Gabapentin was increased from 300 mg to 600 mg BID. She reports that this helped with her pain slightly but that she continues to have numbness and tingling in her lower extremities. The increase in Gabapentin did not make her feel fatigued.   She also needs refills of Atorvastatin and Synthroid    ROS: See pertinent positives and negatives per HPI.  Past Medical History:  Diagnosis Date  . Diabetes mellitus ([facility]   . Fatty liver   . GERD (gastroesophageal reflux disease)    prn med.  . Hiatal hernia 1998  . Hyperlipidemia   . Hyperplastic colon polyp   . Hypertension    new dx. - will start med. 06/28/2011  . Hyperthyroidism   . Hypothyroidism   . Rotator cuff tear, right   . Sleep apnea    prior to gastric bypass - no sleep study since bypass    Past Surgical History:  Procedure Laterality Date  .  ABDOMINAL HYSTERECTOMY  06/25/2000   left salpingo-oophorectomy  . BACK SURGERY  1997   discetomy, lumbar spinal fusion  . CARDIAC CATHETERIZATION  04/01/2006  . CESAREAN SECTION  1991  . CHOLECYSTECTOMY  1989  . EXPLORATORY LAPAROTOMY  05/08/2000   right salpingo-oophorectomy  . LAMINOTOMY / EXCISION DISK POSTERIOR CERVICAL SPINE  08/10/2009   C7-T1  . 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 SHOULDER ARTHROSCOPY  01/02/2006; 05/31/2004   left 2007; right 2005  . SHOULDER ARTHROSCOPY  07/02/2011   Procedure: ARTHROSCOPY SHOULDER;  Surgeon: RCammie Sickle, MD;  Location: [facility]  Service: Orthopedics;  Laterality: Right;  repair supraspinatus, Debride glenohumeral joint, labrum  . SHOULDER ARTHROSCOPY DISTAL CLAVICLE EXCISION AND OPEN ROTATOR CUFF REPAIR  07/26/2010   right  . THYROID SURGERY  early 2000s   goiter  . TUBAL LIGATION      Family History  Problem Relation Age of Onset  . Coronary artery disease Other   . Cancer Paternal Grandfather        Prostate Cancer  . Diabetes Mother   . Hyperlipidemia Mother   . Hypertension Mother   . Thyroid disease Mother   . Rheum arthritis Mother   . COPD Mother      Current Outpatient Medications:  .  aspirin 325 MG tablet, Take 1 tablet (325 mg total) by mouth daily. For heart health, Disp: 30 tablet, Rfl: 3 .  bisoprolol-hydrochlorothiazide (ZIAC)  10-6.25 MG tablet, Take 1 tablet by mouth daily., Disp: 90 tablet, Rfl: 2 .  Blood Glucose Monitoring Suppl (ONETOUCH VERIO) w/Device KIT, 1 Units by Does not apply route daily., Disp: 1 kit, Rfl: 0 .  citalopram (CELEXA) 40 MG tablet, Take 1 tablet (40 mg total) by mouth daily. Reported on 09/19/2015, Disp: 90 tablet, Rfl: 1 .  Dulaglutide (TRULICITY) 1.91 YN/8.2NF SOPN, INJECT 0.75 MG INTO THE SKIN ONCE A WEEK, Disp: 6 mL, Rfl: 4 .  fluticasone (FLONASE ALLERGY RELIEF) 50 MCG/ACT nasal spray, Place 2 sprays into both nostrils daily., Disp: 48 g, Rfl: 1 .  gabapentin  (NEURONTIN) 300 MG capsule, Take 1 capsule (300 mg total) by mouth 3 (three) times daily. (Patient taking differently: Take 600 mg by mouth 3 (three) times daily. ), Disp: 270 capsule, Rfl: 1 .  glipiZIDE (GLUCOTROL) 10 MG tablet, TAKE 1 TABLET TWICE A DAY BEFORE MEALS, Disp: 180 tablet, Rfl: 0 .  HYDROcodone-homatropine (HYCODAN) 5-1.5 MG/5ML syrup, Take 5 mLs by mouth every 8 (eight) hours as needed for cough., Disp: 120 mL, Rfl: 0 .  ibuprofen (ADVIL,MOTRIN) 600 MG tablet, Take 1 tablet (600 mg total) by mouth every 8 (eight) hours as needed., Disp: 20 tablet, Rfl: 0 .  levothyroxine (SYNTHROID, LEVOTHROID) 137 MCG tablet, Take 1 tablet (137 mcg total) by mouth daily before breakfast., Disp: 90 tablet, Rfl: 2 .  metFORMIN (GLUCOPHAGE) 1000 MG tablet, Take 1 tablet (1,000 mg total) by mouth daily with breakfast., Disp: 90 tablet, Rfl: 0 .  nitroGLYCERIN (NITROSTAT) 0.4 MG SL tablet, Place 1 tablet (0.4 mg total) under the tongue every 5 (five) minutes as needed for chest pain (CP or SOB)., Disp: 60 tablet, Rfl: 12 .  ONE TOUCH LANCETS MISC, Test blood sugars once daily Dx: E11.9, Disp: 100 each, Rfl: 3 .  ONETOUCH VERIO test strip, USE TO TEST BLOOD GLUCOSE THREE TIMES A DAY, Disp: 300 each, Rfl: 3 .  risperiDONE (RISPERDAL) 1 MG tablet, Take 1 mg by mouth 2 (two) times daily., Disp: , Rfl: 2 .  simvastatin (ZOCOR) 20 MG tablet, Take 1 tablet (20 mg total) by mouth at bedtime., Disp: 90 tablet, Rfl: 2 .  traZODone (DESYREL) 100 MG tablet, Take 1 tablet (100 mg total) by mouth at bedtime. Reported on 09/19/2015, Disp: 90 tablet, Rfl: 1  EXAM:  VITALS per patient if applicable:  GENERAL: alert, oriented, appears well and in no acute distress  HEENT: atraumatic, conjunttiva clear, no obvious abnormalities on inspection of external nose and ears  NECK: normal movements of the head and neck  LUNGS: on inspection no signs of respiratory distress, breathing rate appears normal, no obvious gross SOB,  gasping or wheezing  CV: no obvious cyanosis  MS: moves all visible extremities without noticeable abnormality  PSYCH/NEURO: pleasant and cooperative, no obvious depression or anxiety, speech and thought processing grossly intact  ASSESSMENT AND PLAN:  1. Type 2 diabetes mellitus with diabetic neuropathy, without long-term current use of insulin ([facility]) - Encouraged to check blood sugars on a routine basis. Will have her come in for blood work. Consider changing Trulicity dose at this time.  - Will increase Gabapentin from 600 mg TID to 800 mg TID for better control.  - Follow up in 3 months for CPE  - Basic Metabolic Panel; Future - Hemoglobin A1c; Future - gabapentin (NEURONTIN) 400 MG capsule; Take 2 capsules (800 mg total) by mouth 3 (three) times daily.  Dispense: 540 capsule; Refill: 1 - glucose blood (  ONETOUCH VERIO) test strip; USE TO TEST BLOOD GLUCOSE THREE TIMES A DAY  Dispense: 300 each; Refill: 3 - glipiZIDE (GLUCOTROL) 10 MG tablet; TAKE 1 TABLET TWICE A DAY BEFORE MEALS  Dispense: 180 tablet; Refill: 0  2. Pure hypercholesterolemia  - simvastatin (ZOCOR) 20 MG tablet; Take 1 tablet (20 mg total) by mouth at bedtime.  Dispense: 90 tablet; Refill: 2  3. Hypothyroidism, unspecified type  - levothyroxine (SYNTHROID) 137 MCG tablet; Take 1 tablet (137 mcg total) by mouth daily before breakfast.  Dispense: 90 tablet; Refill: 2   Discussed the following assessment and plan:   I discussed the assessment and treatment plan with the patient. The patient was provided an opportunity to ask questions and all were answered. The patient agreed with the plan and demonstrated an understanding of the instructions.   The patient was advised to call back or seek an in-person evaluation if the symptoms worsen or if the condition fails to improve as anticipated.   Dorothyann Peng, NP

## 2019-01-13 ENCOUNTER — Other Ambulatory Visit (INDEPENDENT_AMBULATORY_CARE_PROVIDER_SITE_OTHER): Payer: BC Managed Care – PPO

## 2019-01-13 DIAGNOSIS — E1140 Type 2 diabetes mellitus with diabetic neuropathy, unspecified: Secondary | ICD-10-CM | POA: Diagnosis not present

## 2019-01-13 LAB — BASIC METABOLIC PANEL
BUN: 9 mg/dL (ref 6–23)
CO2: 29 mEq/L (ref 19–32)
Calcium: 9.3 mg/dL (ref 8.4–10.5)
Chloride: 102 mEq/L (ref 96–112)
Creatinine, Ser: 0.74 mg/dL (ref 0.40–1.20)
GFR: 97.26 mL/min (ref >60.00)
Glucose, Bld: 172 mg/dL — ABNORMAL HIGH (ref 70–99)
Potassium: 3.9 mEq/L (ref 3.5–5.1)
Sodium: 142 mEq/L (ref 135–145)

## 2019-01-13 LAB — HEMOGLOBIN A1C: Hgb A1c MFr Bld: 7.5 % — ABNORMAL HIGH (ref 4.6–6.5)

## 2019-01-22 ENCOUNTER — Other Ambulatory Visit: Payer: Self-pay | Admitting: Adult Health

## 2019-01-22 DIAGNOSIS — E118 Type 2 diabetes mellitus with unspecified complications: Secondary | ICD-10-CM

## 2019-01-22 DIAGNOSIS — Z760 Encounter for issue of repeat prescription: Secondary | ICD-10-CM

## 2019-01-25 ENCOUNTER — Encounter: Payer: Self-pay | Admitting: Adult Health

## 2019-01-26 ENCOUNTER — Other Ambulatory Visit: Payer: Self-pay | Admitting: Family Medicine

## 2019-01-26 DIAGNOSIS — E118 Type 2 diabetes mellitus with unspecified complications: Secondary | ICD-10-CM

## 2019-01-26 DIAGNOSIS — Z760 Encounter for issue of repeat prescription: Secondary | ICD-10-CM

## 2019-01-26 MED ORDER — METFORMIN HCL 1000 MG PO TABS: 1000.0000 mg | ORAL_TABLET | Freq: Every day | ORAL | 0 refills | Status: DC

## 2019-01-26 NOTE — Telephone Encounter (Signed)
Rx sent to the pharmacy by e-scribe. 

## 2019-03-15 ENCOUNTER — Encounter: Payer: Self-pay | Admitting: Adult Health

## 2019-03-16 ENCOUNTER — Other Ambulatory Visit: Payer: Self-pay | Admitting: Adult Health

## 2019-03-16 DIAGNOSIS — G629 Polyneuropathy, unspecified: Secondary | ICD-10-CM

## 2019-04-05 ENCOUNTER — Other Ambulatory Visit: Payer: Self-pay | Admitting: Adult Health

## 2019-04-05 DIAGNOSIS — E1140 Type 2 diabetes mellitus with diabetic neuropathy, unspecified: Secondary | ICD-10-CM

## 2019-04-05 DIAGNOSIS — Z760 Encounter for issue of repeat prescription: Secondary | ICD-10-CM

## 2019-04-05 DIAGNOSIS — E118 Type 2 diabetes mellitus with unspecified complications: Secondary | ICD-10-CM

## 2019-04-06 ENCOUNTER — Other Ambulatory Visit: Payer: Self-pay

## 2019-04-06 ENCOUNTER — Other Ambulatory Visit: Payer: Self-pay | Admitting: Family Medicine

## 2019-04-06 ENCOUNTER — Other Ambulatory Visit (INDEPENDENT_AMBULATORY_CARE_PROVIDER_SITE_OTHER): Payer: BC Managed Care – PPO

## 2019-04-06 DIAGNOSIS — E1140 Type 2 diabetes mellitus with diabetic neuropathy, unspecified: Secondary | ICD-10-CM

## 2019-04-07 ENCOUNTER — Other Ambulatory Visit: Payer: Self-pay

## 2019-04-07 ENCOUNTER — Telehealth (INDEPENDENT_AMBULATORY_CARE_PROVIDER_SITE_OTHER): Payer: BC Managed Care – PPO | Admitting: Adult Health

## 2019-04-07 DIAGNOSIS — E1140 Type 2 diabetes mellitus with diabetic neuropathy, unspecified: Secondary | ICD-10-CM | POA: Diagnosis not present

## 2019-04-07 LAB — HEMOGLOBIN A1C: Hgb A1c MFr Bld: 8.3 % — ABNORMAL HIGH (ref 4.6–6.5)

## 2019-04-07 MED ORDER — TRULICITY 1.5 MG/0.5ML ~~LOC~~ SOAJ: 1.5000 mg | SUBCUTANEOUS | 2 refills | Status: DC

## 2019-04-07 NOTE — Progress Notes (Signed)
Virtual Visit via Telephone Note  I connected with Alexandra [name] on 04/07/19 at  4:00 PM EDT by telephone and verified that I am speaking with the correct person using two identifiers.   I discussed the limitations, risks, security and privacy concerns of performing an evaluation and management service by telephone and the availability of in person appointments. I also discussed with the patient that there may be a patient responsible charge related to this service. The patient expressed understanding and agreed to proceed.  Location patient: home Location provider: work or home office Participants present for the call: patient, provider Patient did not have a visit in the prior 7 days to address this/these issue(s).   History of Present Illness: 58 year old female who is being evaluated today for 96-month follow-up regarding diabetes mellitus.  She is currently maintained on Trulicity [id number] mg, glipizide 10 mg BID and metformin 1000 mg twice daily.  She has been checking her blood sugars on a consistent basis reports readings mostly above 200.  She denies hypoglycemic events.  She does report that she has been working on diet trying to eat more lean meats fruits and vegetables, but is not exercising.  In June 2020 her A1c had increased from 6.3-7.5.  Today her A1c is 8.3   Observations/Objective: Patient sounds cheerful and well on the phone. I do not appreciate any SOB. Speech and thought processing are grossly intact. Patient reported vitals:  Assessment and Plan: 1. Type 2 diabetes mellitus with diabetic neuropathy, without long-term current use of insulin (HCC) -We will increase Trulicity to 1.5 mg.  I stressed the importance of routine exercise as well as a heart healthy diet.  We will have her follow-up in 3 months - Dulaglutide (TRULICITY) 1.5 0000000 SOPN; Inject 1.5 mg into the skin once a week.  Dispense: 4 pen; Refill: 2   Follow Up Instructions:  I did not refer  this patient for an OV in the next 24 hours for this/these issue(s).  I discussed the assessment and treatment plan with the patient. The patient was provided an opportunity to ask questions and all were answered. The patient agreed with the plan and demonstrated an understanding of the instructions.   The patient was advised to call back or seek an in-person evaluation if the symptoms worsen or if the condition fails to improve as anticipated.  I provided 18 minutes of non-face-to-face time during this encounter.   Alexandra Peng, NP

## 2019-04-14 ENCOUNTER — Encounter: Payer: Self-pay | Admitting: Adult Health

## 2019-04-15 ENCOUNTER — Encounter: Payer: Self-pay | Admitting: Physical Medicine and Rehabilitation

## 2019-04-15 ENCOUNTER — Other Ambulatory Visit: Payer: Self-pay

## 2019-04-15 ENCOUNTER — Encounter
Payer: BC Managed Care – PPO | Attending: Physical Medicine and Rehabilitation | Admitting: Physical Medicine and Rehabilitation

## 2019-04-15 VITALS — BP 116/76 | HR 92 | Temp 97.3°F | Ht 62.5 in | Wt 188.2 lb

## 2019-04-15 DIAGNOSIS — M5116 Intervertebral disc disorders with radiculopathy, lumbar region: Secondary | ICD-10-CM | POA: Diagnosis present

## 2019-04-15 DIAGNOSIS — M792 Neuralgia and neuritis, unspecified: Secondary | ICD-10-CM

## 2019-04-15 DIAGNOSIS — G5701 Lesion of sciatic nerve, right lower limb: Secondary | ICD-10-CM | POA: Diagnosis present

## 2019-04-15 DIAGNOSIS — M5416 Radiculopathy, lumbar region: Secondary | ICD-10-CM | POA: Insufficient documentation

## 2019-04-15 MED ORDER — OXCARBAZEPINE 300 MG PO TABS: 300.0000 mg | ORAL_TABLET | Freq: Every day | ORAL | 5 refills | Status: DC

## 2019-04-15 MED ORDER — LIDOCAINE 5 % EX OINT: 1.0000 "application " | TOPICAL_OINTMENT | Freq: Four times a day (QID) | CUTANEOUS | 0 refills | Status: DC | PRN

## 2019-04-15 NOTE — Progress Notes (Signed)
Subjective:    Patient ID: Alexandra [name], female    DOB: [birth year], 58 y.o.   MRN: [id number]  HPI CC: Neuropathy  Patient is a 58 yr old with hx of DM- A1c 8.3, HTN, here for evaluaiton of Neuropathy.  Hx of spinal fusion- 1994/5- L4/5 fused R Shoulder surgery- x2- 2004 and 2006/7  RLE>LLE Starts in low back/R hip Radiates with stabbing, throbbing/ and dull aching pain and burning- intermittent burning- NOTHING HELPS burning. Tender to palpation- esp bottom of feet- hurts to walk.  Sometimes wears specific flipflops with arch support. 1 pair of tennis shoes that feels good- [employer] brand. None of her other tennis shoes feel good.   Tried: Gabapentin- on 1200 mg TID- gets a little relief initially, but now doesn't do anything. Tylenol Lyrica- didn't do anything for nerve pain- was on pretty high dose 200 mg BID/TID Norco- helped- didn't help burning- was taking with gabapentin Voltaren gel- didn't help at all Lidocaine patches- when first started using, was helpful, but stopped working. Cymbalta- was on a few months- no improvement    Pain Inventory Average Pain 7 Pain Right Now 7 My pain is stabbing and tingling  In the last 24 hours, has pain interfered with the following? General activity 4 Relation with others 4 Enjoyment of life 4 What TIME of day is your pain at its worst? night Sleep (in general) Fair  Pain is worse with: walking and some activites Pain improves with: rest and medication Relief from Meds: na  Mobility walk without assistance how many minutes can you walk? 10 ability to climb steps?  yes do you drive?  yes  Function disabled: date disabled 11/2013 retired  Neuro/Psych numbness tingling  Prior Studies Any changes since last visit?  no  Physicians involved in your care Primary care Beaulah Dinning NP   Family History  Problem Relation Age of Onset  . Coronary artery disease Other   . Cancer Paternal Grandfather    Prostate Cancer  . Diabetes Mother   . Hyperlipidemia Mother   . Hypertension Mother   . Thyroid disease Mother   . Rheum arthritis Mother   . COPD Mother    Social History   Socioeconomic History  . Marital status: Married    Spouse name: Not on file  . Number of children: Not on file  . Years of education: Not on file  . Highest education level: Not on file  Occupational History  . Occupation: unemployed  Social Needs  . Financial resource strain: Not on file  . Food insecurity    Worry: Not on file    Inability: Not on file  . Transportation needs    Medical: Not on file    Non-medical: Not on file  Tobacco Use  . Smoking status: Former Smoker    Packs/day: 1.00    Years: 10.00    Pack years: 10.00    Types: Cigarettes    Quit date: 07/29/1984    Years since quitting: 34.7  . Smokeless tobacco: Never Used  Substance and Sexual Activity  . Alcohol use: Yes    Comment: occassionally  . Drug use: No  . Sexual activity: Yes  Lifestyle  . Physical activity    Days per week: Not on file    Minutes per session: Not on file  . Stress: Not on file  Relationships  . Social Herbalist on phone: Not on file    Gets together: Not on  file    Attends religious service: Not on file    Active member of club or organization: Not on file    Attends meetings of clubs or organizations: Not on file    Relationship status: Not on file  Other Topics Concern  . Not on file  Social History Narrative   Married for 24 years    Six children- all live around here. Two daughters live with her   Six grand children    On Disability for mental issues   No pets   Likes to go to movies and out to dinner. She likes to do puzzle books. Likes to read      She eats a healthy diet   She does not exercise          Past Surgical History:  Procedure Laterality Date  . ABDOMINAL HYSTERECTOMY  06/25/2000   left salpingo-oophorectomy  . BACK SURGERY  1997   discetomy, lumbar  spinal fusion  . CARDIAC CATHETERIZATION  04/01/2006  . CESAREAN SECTION  1991  . CHOLECYSTECTOMY  1989  . EXPLORATORY LAPAROTOMY  05/08/2000   right salpingo-oophorectomy  . LAMINOTOMY / EXCISION DISK POSTERIOR CERVICAL SPINE  08/10/2009   C7-T1  . 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 SHOULDER ARTHROSCOPY  01/02/2006; 05/31/2004   left 2007; right 2005  . SHOULDER ARTHROSCOPY  07/02/2011   Procedure: ARTHROSCOPY SHOULDER;  Surgeon: Cammie Sickle., MD;  Location: [facility];  Service: Orthopedics;  Laterality: Right;  repair supraspinatus, Debride glenohumeral joint, labrum  . SHOULDER ARTHROSCOPY DISTAL CLAVICLE EXCISION AND OPEN ROTATOR CUFF REPAIR  07/26/2010   right  . THYROID SURGERY  early 2000s   goiter  . TUBAL LIGATION     Past Medical History:  Diagnosis Date  . Diabetes mellitus ([place])   . Fatty liver   . GERD (gastroesophageal reflux disease)    prn med.  . Hiatal hernia 1998  . Hyperlipidemia   . Hyperplastic colon polyp   . Hypertension    new dx. - will start med. 06/28/2011  . Hyperthyroidism   . Hypothyroidism   . Rotator cuff tear, right   . Sleep apnea    prior to gastric bypass - no sleep study since bypass   BP 116/76   Pulse 92   Temp (!) 97.3 F (36.3 C)   Ht 5' 2.5" (1.588 m)   Wt 188 lb 3.2 oz (85.4 kg)   SpO2 94%   BMI 33.87 kg/m   Opioid Risk Score:   Fall Risk Score:  `1  Depression screen PHQ 2/9  Depression screen [facility] 2/9 04/15/2019 04/10/2018 03/25/2017 03/19/2016  Decreased Interest 3 0 0 0  Down, Depressed, Hopeless 0 0 0 0  PHQ - 2 Score 3 0 0 0  Altered sleeping 2 - - -  Tired, decreased energy 1 - - -  Change in appetite 3 - - -  Feeling bad or failure about yourself  0 - - -  Trouble concentrating 0 - - -  Moving slowly or fidgety/restless 0 - - -  Suicidal thoughts 0 - - -  PHQ-9 Score 9 - - -  Some recent data might be hidden    Review of Systems  Constitutional: Negative.   HENT: Negative.   Eyes:  Negative.   Respiratory: Negative.   Cardiovascular: Negative.   Gastrointestinal: Negative.   Endocrine: Negative.   Genitourinary: Negative.   Musculoskeletal: Negative.   Skin: Negative.   Allergic/Immunologic:  Negative.   Neurological: Positive for numbness.       Tingling  Hematological: Negative.   Psychiatric/Behavioral: Positive for dysphoric mood.       On celexa  All other systems reviewed and are negative.  Sometimes skin gets irritated and can't stand to have clothes touching skin- very irritated    Objective:   Physical Exam Awake, alert, appropriate, sitting on table, in discomfort, NAD MS: 5-/5 in B/L LEs- HF/KE/DF/PF/EHL Stocking glove distribution of decreased sensation to LT- on L4-S1 B/L and in tips of fingers as well B/L  TTP just lateral to piriformis on R buttock - also TTP over piriformis muscle- very tight muscles in buttocks B/L       Assessment & Plan:  Patient is a 58 yr old female with lumbar radiculopathy/nerve pain and piriformis syndrome.    1. DM- needs to get BGs under better control- goal HbA1c of <7- ist he goal- in long term goals, <6.5   2. DM Neuropathy/nerve pain- Trileptal/ Oxycarmazepine- take 1 tab/300 mg nightly x 1 week then 600 mg nightly x 1 week then 900 mg nightly- don't increase dose after that. Side effects- risk of a RASH- if you get a rash that you don't recognize, STOP medicine immediately. Could also be a little sleepy from it; or drop your sodium. Can feel off kilter; if so, eat some salt.  3. Will need to check BMP/Sodium level in 4 weeks- at f/u.    4. Lidocaine ointment up to 4x/day for foot discomfort- use a dime sized dollop and rub on feet as needed  5. F/U in 1 month

## 2019-04-15 NOTE — Patient Instructions (Addendum)
Patient is a 58 yr old female with lumbar radiculopathy/nerve pain and piriformis syndrome.    1. DM- needs to get BGs under better control- goal HbA1c of <7- ist he goal- in long term goals, <6.5   2. DM Neuropathy/nerve pain- Trileptal/ Oxycarmazepine- take 1 tab/300 mg nightly x 1 week then 600 mg nightly x 1 week then 900 mg nightly- don't increase dose after that. Side effects- risk of a RASH- if you get a rash that you don't recognize, STOP medicine immediately. Could also be a little sleepy from it; or drop your sodium. Can feel off kilter; if so, eat some salt.   3. Will need to check BMP/Sodium level in 4 weeks- at f/u.    4. Lidocaine ointment up to 4x/day for foot discomfort- use a dime sized dollop and rub on feet as needed  5. F/U in 1 month

## 2019-04-15 NOTE — Telephone Encounter (Signed)
Please advise.  04/07/2019 Rx was sent in for 4 pens, inject 1.5mg  once a week.

## 2019-04-16 NOTE — Telephone Encounter (Signed)
Please advise. We have samples of 0.75mg /0.64ml. 2 pens per box.

## 2019-05-10 ENCOUNTER — Encounter: Payer: Self-pay | Admitting: Adult Health

## 2019-05-13 ENCOUNTER — Encounter: Payer: Self-pay | Admitting: Physical Medicine and Rehabilitation

## 2019-05-13 ENCOUNTER — Encounter
Payer: BC Managed Care – PPO | Attending: Physical Medicine and Rehabilitation | Admitting: Physical Medicine and Rehabilitation

## 2019-05-13 ENCOUNTER — Other Ambulatory Visit: Payer: Self-pay

## 2019-05-13 VITALS — BP 116/79 | HR 83 | Temp 97.3°F | Ht 62.0 in | Wt 190.0 lb

## 2019-05-13 DIAGNOSIS — M5416 Radiculopathy, lumbar region: Secondary | ICD-10-CM

## 2019-05-13 DIAGNOSIS — M5116 Intervertebral disc disorders with radiculopathy, lumbar region: Secondary | ICD-10-CM | POA: Insufficient documentation

## 2019-05-13 DIAGNOSIS — G5701 Lesion of sciatic nerve, right lower limb: Secondary | ICD-10-CM | POA: Insufficient documentation

## 2019-05-13 DIAGNOSIS — M792 Neuralgia and neuritis, unspecified: Secondary | ICD-10-CM | POA: Insufficient documentation

## 2019-05-13 MED ORDER — LEVETIRACETAM 500 MG PO TABS: 500.0000 mg | ORAL_TABLET | Freq: Two times a day (BID) | ORAL | 5 refills | Status: DC

## 2019-05-13 NOTE — Progress Notes (Signed)
Subjective:    Patient ID: Alexandra [name], female    DOB: 11-27-1960, 58 y.o.   MRN: [id number]  HPI  CC: Lumbar radiculopathy with sciatica of RLE  Patient is a 58 yr old female with lumbar radiculopathy/nerve pain and piriformis syndrome.  No improvement at all from [facility];  no side effects  Notices a little improvement - like trying to release in R buttock, but doesn't completely relax- so helps slightly, but only lasts until gets up- sits on ball for entire time watching TV show.  Lidocaine patches only helped for days Used lidocaine cream- did help at first on leg and bottom of feet- was using 4x/day- put it where it hurts first/most but didn't have enough to do toes. Didn't try on R toes so far; R foot is the main culprit of pain- never tried on L which also bothers her, but not as bad.  Doesn't carry wallet or phone on R back pocket.   Pain Inventory Average Pain 3 Pain Right Now 6 My pain is sharp, dull, stabbing and tingling  In the last 24 hours, has pain interfered with the following? General activity 5 Relation with others 0 Enjoyment of life 2 What TIME of day is your pain at its worst? evening Sleep (in general) Fair  Pain is worse with: walking, standing and some activites Pain improves with: medication Relief from Meds: 3  Mobility ability to climb steps?  yes do you drive?  yes  Function retired  Neuro/Psych tingling depression  Prior Studies Any changes since last visit?  no  Physicians involved in your care Any changes since last visit?  no   Family History  Problem Relation Age of Onset  . Coronary artery disease Other   . Cancer Paternal Grandfather        Prostate Cancer  . Diabetes Mother   . Hyperlipidemia Mother   . Hypertension Mother   . Thyroid disease Mother   . Rheum arthritis Mother   . COPD Mother    Social History   Socioeconomic History  . Marital status: Married    Spouse name: Not on file   . Number of children: Not on file  . Years of education: Not on file  . Highest education level: Not on file  Occupational History  . Occupation: unemployed  Social Needs  . Financial resource strain: Not on file  . Food insecurity    Worry: Not on file    Inability: Not on file  . Transportation needs    Medical: Not on file    Non-medical: Not on file  Tobacco Use  . Smoking status: Former Smoker    Packs/day: 1.00    Years: 10.00    Pack years: 10.00    Types: Cigarettes    Quit date: 07/29/1984    Years since quitting: 34.8  . Smokeless tobacco: Never Used  Substance and Sexual Activity  . Alcohol use: Yes    Comment: occassionally  . Drug use: No  . Sexual activity: Yes  Lifestyle  . Physical activity    Days per week: Not on file    Minutes per session: Not on file  . Stress: Not on file  Relationships  . Social Herbalist on phone: Not on file    Gets together: Not on file    Attends religious service: Not on file    Active member of club or organization: Not on file    Attends meetings  of clubs or organizations: Not on file    Relationship status: Not on file  Other Topics Concern  . Not on file  Social History Narrative   Married for 24 years    Six children- all live around here. Two daughters live with her   Six grand children    On Disability for mental issues   No pets   Likes to go to movies and out to dinner. She likes to do puzzle books. Likes to read      She eats a healthy diet   She does not exercise          Past Surgical History:  Procedure Laterality Date  . ABDOMINAL HYSTERECTOMY  06/25/2000   left salpingo-oophorectomy  . BACK SURGERY  1997   discetomy, lumbar spinal fusion  . CARDIAC CATHETERIZATION  04/01/2006  . CESAREAN SECTION  1991  . CHOLECYSTECTOMY  1989  . EXPLORATORY LAPAROTOMY  05/08/2000   right salpingo-oophorectomy  . LAMINOTOMY / EXCISION DISK POSTERIOR CERVICAL SPINE  08/10/2009   C7-T1  . ROUX-EN-Y  GASTRIC BYPASS  05/19/2007  . SHOULDER ARTHROSCOPY  01/02/2006; 05/31/2004   left 2007; right 2005  . SHOULDER ARTHROSCOPY  07/02/2011   Procedure: ARTHROSCOPY SHOULDER;  Surgeon: Cammie Sickle., MD;  Location: [facility];  Service: Orthopedics;  Laterality: Right;  repair supraspinatus, Debride glenohumeral joint, labrum  . SHOULDER ARTHROSCOPY DISTAL CLAVICLE EXCISION AND OPEN ROTATOR CUFF REPAIR  07/26/2010   right  . THYROID SURGERY  early 2000s   goiter  . TUBAL LIGATION     Past Medical History:  Diagnosis Date  . Diabetes mellitus ([place])   . Fatty liver   . GERD (gastroesophageal reflux disease)    prn med.  . Hiatal hernia 1998  . Hyperlipidemia   . Hyperplastic colon polyp   . Hypertension    new dx. - will start med. 06/28/2011  . Hyperthyroidism   . Hypothyroidism   . Rotator cuff tear, right   . Sleep apnea    prior to gastric bypass - no sleep study since bypass   BP 116/79   Pulse 83   Temp (!) 97.3 F (36.3 C)   Ht 5\' 2"  (1.575 m)   Wt 190 lb (86.2 kg)   SpO2 97%   BMI 34.75 kg/m   Opioid Risk Score:   Fall Risk Score:  `1  Depression screen PHQ 2/9  Depression screen [facility] 2/9 04/15/2019 04/10/2018 03/25/2017 03/19/2016  Decreased Interest 3 0 0 0  Down, Depressed, Hopeless 0 0 0 0  PHQ - 2 Score 3 0 0 0  Altered sleeping 2 - - -  Tired, decreased energy 1 - - -  Change in appetite 3 - - -  Feeling bad or failure about yourself  0 - - -  Trouble concentrating 0 - - -  Moving slowly or fidgety/restless 0 - - -  Suicidal thoughts 0 - - -  PHQ-9 Score 9 - - -  Some recent data might be hidden     Review of Systems  Constitutional: Negative.   HENT: Negative.   Eyes: Negative.   Respiratory: Negative.   Cardiovascular: Negative.   Gastrointestinal: Negative.   Endocrine: Negative.   Genitourinary: Negative.   Musculoskeletal: Negative.   Skin: Negative.   Allergic/Immunologic: Negative.   Neurological: Positive for numbness.   Hematological: Negative.   Psychiatric/Behavioral: Positive for decreased concentration.  All other systems reviewed and are negative.  Objective:   Physical Exam Awake, alert, appropriate, sitting on table, appears uncomfortable, NAD TTP over R piriformis area Still has decreased sensation of RLE like last visit        Assessment & Plan:  Patient is a 58 yr old female with lumbar radiculopathy/nerve pain and piriformis syndrome.  1. So let's wean off Oxcarbemazepine- 600 mg nightly x 3 days then 300 mg nightly x 3 days, then stop.  2. Keppra/Levicitracam- 500 mg 2x/day x 1 week then 2 tabs/1000 mg 2x/day-   3. Don't need to check lab for sodium level since coming off the Trileptal/Oxcarbemazepine.   4. Needs referral to Interventional Radiology since Dr Letta Pate is out- for R piriformis steroid injection under fluoroscopy-   5. F/U in 4-6 weeks

## 2019-05-13 NOTE — Patient Instructions (Signed)
Patient is a 58 yr old female with lumbar radiculopathy/nerve pain and piriformis syndrome.  1. So let's wean off Oxcarbemazepine- 600 mg nightly x 3 days then 300 mg nightly x 3 days, then stop.  2. Keppra/Levicitracam- 500 mg 2x/day x 1 week then 2 tabs/1000 mg 2x/day- Vit B complex daily if gets irritable (5%)- takes with breakfast and either dinner or bedtime.  3. Don't need to check lab for sodium level since coming off the Trileptal/Oxcarbemazepine.   4. Needs referral to Interventional Radiology since Dr Letta Pate is out- for R piriformis steroid injection under fluoroscopy-   5. F/U in 4-6 weeks

## 2019-05-28 ENCOUNTER — Other Ambulatory Visit: Payer: Self-pay

## 2019-05-28 ENCOUNTER — Encounter (HOSPITAL_BASED_OUTPATIENT_CLINIC_OR_DEPARTMENT_OTHER): Payer: BC Managed Care – PPO | Admitting: Physical Medicine & Rehabilitation

## 2019-05-28 ENCOUNTER — Encounter: Payer: Self-pay | Admitting: Physical Medicine & Rehabilitation

## 2019-05-28 VITALS — BP 106/70 | HR 80 | Temp 97.5°F | Ht 62.0 in | Wt 186.0 lb

## 2019-05-28 DIAGNOSIS — G5701 Lesion of sciatic nerve, right lower limb: Secondary | ICD-10-CM

## 2019-05-28 DIAGNOSIS — M792 Neuralgia and neuritis, unspecified: Secondary | ICD-10-CM | POA: Diagnosis not present

## 2019-05-28 NOTE — Progress Notes (Signed)
RIGHT piriformis injection under ultrasound guidance  Indication RIGHT piriformis syndrome which has not responded to physical therapy or medication management  Informed consent was obtained after the patient reviewed and anatomy and potential risks as well as potential benefits. She  has given written consent Patient placed prone on exam table area pre-scan and with curvilinear 4 Hz transducer starting at the PSIS then identifying the PI IS and then orienting transducer along the long axis of the piriformis muscle which was found deep to Glut max at 4cm depth. Area was marked and prepped with Betadine and draped in a sterile manner. 3 cc of lidocaine were infiltrate into the skin and subcutaneous tissue using a 25-gauge 1.5 inch needle. Then a 80 mm echo block needle was inserted under ultrasound guidance targeting the piriformis muscle. Needle tip entered piriformis muscle then a solution containing one ML of Celestone 6 mg +3 ML of 1% lidocaine was injected. Patient tolerated procedure well

## 2019-05-28 NOTE — Patient Instructions (Signed)
Piriformis Syndrome  Piriformis syndrome is a condition that can cause pain and numbness in your buttocks and down the back of your leg. Piriformis syndrome happens when the small muscle that connects the base of your spine to your hip (piriformis muscle) presses on the nerve that runs down the back of your leg (sciatic nerve). The piriformis muscle helps your hip rotate and helps to bring your leg back and out. It also helps shift your weight to keep you stable while you are walking. The sciatic nerve runs under or through the piriformis muscle. Damage to the piriformis muscle can cause spasms that put pressure on the nerve below. This causes pain and discomfort while sitting and moving. The pain may feel as if it begins in the buttock and spreads (radiates) down your hip and thigh. What are the causes? This condition is caused by pressure on the sciatic nerve from the piriformis muscle. The piriformis muscle can get irritated with overuse, especially if other hip muscles are weak and the piriformis muscle has to do extra work. Piriformis syndrome can also occur after an injury, like a fall onto your buttocks. What increases the risk? You are more likely to develop this condition if you:  Are a woman.  Sit for long periods of time.  Are a cyclist.  Have weak buttocks muscles (gluteal muscles). What are the signs or symptoms? Symptoms of this condition include:  Pain, tingling, or numbness that starts in the buttock and runs down the back of your leg (sciatica).  Pain in the groin or thigh area. Your symptoms may get worse:  The longer you sit.  When you walk, run, or climb stairs.  When straining to have a bowel movement. How is this diagnosed? This condition is diagnosed based on your symptoms, medical history, and physical exam.  During the exam, your health care provider may: ? Move your leg into different positions to check for pain. ? Press on the muscles of your hip and  buttock to see if that increases your symptoms.  You may also have tests, including: ? Imaging tests such as X-rays, MRI, or ultrasound. ? Electromyogram (EMG). This test measures electrical signals sent by your nerves into the muscles. ? Nerve conduction study. This test measures how well electrical signals pass through your nerves. How is this treated? This condition may be treated by:  Stopping all activities that cause pain or make your condition worse.  Applying ice or using heat therapy.  Taking medicines to reduce pain and swelling.  Taking a muscle relaxer (muscle relaxant) to stop muscle spasms.  Doing range-of-motion and strengthening exercises (physical therapy) as told by your health care provider.  Massaging the area.  Having acupuncture.  Getting an injection of medicine in the piriformis muscle. Your health care provider will choose the medicine based on your condition. He or she may inject: ? An anti-inflammatory medicine (steroid) to reduce swelling. ? A numbing medicine (local anesthetic) to block the pain. ? Botulinum toxin. The toxin blocks nerve impulses to specific muscles to reduce muscle tension. In rare cases, you may need surgery to cut the muscle and release pressure on the nerve if other treatments do not work. Follow these instructions at home: Activity  Do not sit for long periods. Get up and walk around every 20 minutes or as often as told by your health care provider. ? When driving long distances, make sure to take frequent stops to get up and stretch.  Use a  cushion when you sit on hard surfaces.  Do exercises as told by your health care provider.  Return to your normal activities as told by your health care provider. Ask your health care provider what activities are safe for you. Managing pain, stiffness, and swelling      If directed, apply heat to the affected area as often as told by your health care provider. Use the heat source that  your health care provider recommends, such as a moist heat pack or a heating pad. ? Place a towel between your skin and the heat source. ? Leave the heat on for 20-30 minutes. ? Remove the heat if your skin turns bright red. This is especially important if you are unable to feel pain, heat, or cold. You may have a greater risk of getting burned.  If directed, put ice on the injured area. ? Put ice in a plastic bag. ? Place a towel between your skin and the bag. ? Leave the ice on for 20 minutes, 2-3 times a day. General instructions  Take over-the-counter and prescription medicines only as told by your health care provider.  Ask your health care provider if the medicine prescribed to you requires you to avoid driving or using heavy machinery.  You may need to take actions to prevent or treat constipation, such as: ? Drink enough fluid to keep your urine pale yellow. ? Take over-the-counter or prescription medicines. ? Eat foods that are high in fiber, such as beans, whole grains, and fresh fruits and vegetables. ? Limit foods that are high in fat and processed sugars, such as fried or sweet foods.  Keep all follow-up visits as told by your health care provider. This is important. How is this prevented?  Do not sit for longer than 20 minutes at a time. When you sit, choose padded surfaces.  Warm up and stretch before being active.  Cool down and stretch after being active.  Give your body time to rest between periods of activity.  Make sure to use equipment that fits you.  Maintain physical fitness, including: ? Strength. ? Flexibility. Contact a health care provider if:  Your pain and stiffness continue or get worse.  Your leg or hip becomes weak.  You have changes in your bowel function or bladder function. Summary  Piriformis syndrome is a condition that can cause pain, tingling, and numbness in your buttocks and down the back of your leg.  You may try applying heat  or ice to relieve the pain.  Do not sit for long periods. Get up and walk around every 20 minutes or as often as told by your health care provider. This information is not intended to replace advice given to you by your health care provider. Make sure you discuss any questions you have with your health care provider. Document Released: 07/15/2005 Document Revised: 11/05/2018 Document Reviewed: 03/11/2018 Elsevier Patient Education  2020 [employer]  

## 2019-06-18 ENCOUNTER — Encounter: Payer: BC Managed Care – PPO | Admitting: Physical Medicine and Rehabilitation

## 2019-07-14 ENCOUNTER — Other Ambulatory Visit: Payer: Self-pay | Admitting: Adult Health

## 2019-07-14 ENCOUNTER — Encounter: Payer: Self-pay | Admitting: Adult Health

## 2019-07-14 DIAGNOSIS — E119 Type 2 diabetes mellitus without complications: Secondary | ICD-10-CM

## 2019-07-19 ENCOUNTER — Other Ambulatory Visit: Payer: BC Managed Care – PPO

## 2019-07-20 ENCOUNTER — Other Ambulatory Visit: Payer: Self-pay

## 2019-07-20 ENCOUNTER — Telehealth (INDEPENDENT_AMBULATORY_CARE_PROVIDER_SITE_OTHER): Payer: BC Managed Care – PPO | Admitting: Adult Health

## 2019-07-20 DIAGNOSIS — E1140 Type 2 diabetes mellitus with diabetic neuropathy, unspecified: Secondary | ICD-10-CM | POA: Diagnosis not present

## 2019-07-20 MED ORDER — METFORMIN HCL 1000 MG PO TABS: 1000.0000 mg | ORAL_TABLET | Freq: Every day | ORAL | 0 refills | Status: DC

## 2019-07-20 NOTE — Progress Notes (Signed)
Virtual Visit via Video Note  I connected with Alexandra [name] on 07/20/19 at  1:00 PM EST by a video enabled telemedicine application and verified that I am speaking with the correct person using two identifiers.  Location patient: home Location provider:work or home office Persons participating in the virtual visit: patient, provider  I discussed the limitations of evaluation and management by telemedicine and the availability of in person appointments. The patient expressed understanding and agreed to proceed.   HPI: 58 year old female who is being evaluated today for follow-up regarding diabetes mellitus.  In September her A1c had increased from 7.5-8.3.  At this time Trulicity was increased to 1.5 mg.  He was kept on glipizide 10 mg twice daily and Metformin 1000 mg daily.  Since the increase of Trulicity she denies hypoglycemic events.  She has not experienced any nausea, vomiting, or abdominal pain.  She has been monitoring her blood sugars at home and reports readings consistently in the 150 range.  He has had a few blood sugars over 200.  She has not been exercising and has not been eating healthy.   ROS: See pertinent positives and negatives per HPI.  Past Medical History:  Diagnosis Date  . Diabetes mellitus ([place])   . Fatty liver   . GERD (gastroesophageal reflux disease)    prn med.  . Hiatal hernia 1998  . Hyperlipidemia   . Hyperplastic colon polyp   . Hypertension    new dx. - will start med. 06/28/2011  . Hyperthyroidism   . Hypothyroidism   . Rotator cuff tear, right   . Sleep apnea    prior to gastric bypass - no sleep study since bypass    Past Surgical History:  Procedure Laterality Date  . ABDOMINAL HYSTERECTOMY  06/25/2000   left salpingo-oophorectomy  . BACK SURGERY  1997   discetomy, lumbar spinal fusion  . CARDIAC CATHETERIZATION  04/01/2006  . CESAREAN SECTION  1991  . CHOLECYSTECTOMY  1989  . EXPLORATORY LAPAROTOMY  05/08/2000   right  salpingo-oophorectomy  . LAMINOTOMY / EXCISION DISK POSTERIOR CERVICAL SPINE  08/10/2009   C7-T1  . 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 SHOULDER ARTHROSCOPY  01/02/2006; 05/31/2004   left 2007; right 2005  . SHOULDER ARTHROSCOPY  07/02/2011   Procedure: ARTHROSCOPY SHOULDER;  Surgeon: Cammie Sickle., MD;  Location: [facility];  Service: Orthopedics;  Laterality: Right;  repair supraspinatus, Debride glenohumeral joint, labrum  . SHOULDER ARTHROSCOPY DISTAL CLAVICLE EXCISION AND OPEN ROTATOR CUFF REPAIR  07/26/2010   right  . THYROID SURGERY  early 2000s   goiter  . TUBAL LIGATION      Family History  Problem Relation Age of Onset  . Coronary artery disease Other   . Cancer Paternal Grandfather        Prostate Cancer  . Diabetes Mother   . Hyperlipidemia Mother   . Hypertension Mother   . Thyroid disease Mother   . Rheum arthritis Mother   . COPD Mother      Current Outpatient Medications:  .  aspirin 325 MG tablet, Take 1 tablet (325 mg total) by mouth daily. For heart health, Disp: 30 tablet, Rfl: 3 .  bisoprolol-hydrochlorothiazide (ZIAC) 10-6.25 MG tablet, TAKE 1 TABLET DAILY, Disp: 90 tablet, Rfl: 3 .  Blood Glucose Monitoring Suppl (ONETOUCH VERIO) w/Device KIT, 1 Units by Does not apply route daily., Disp: 1 kit, Rfl: 0 .  citalopram (CELEXA) 40 MG tablet, Take 1 tablet (40 mg total)  by mouth daily. Reported on 09/19/2015, Disp: 90 tablet, Rfl: 1 .  Dulaglutide (TRULICITY) 1.5 EH/2.1YY SOPN, Inject 1.5 mg into the skin once a week., Disp: 12 pen, Rfl: 0 .  fluticasone (FLONASE ALLERGY RELIEF) 50 MCG/ACT nasal spray, Place 2 sprays into both nostrils daily., Disp: 48 g, Rfl: 1 .  gabapentin (NEURONTIN) 400 MG capsule, Take 2 capsules (800 mg total) by mouth 3 (three) times daily., Disp: 540 capsule, Rfl: 1 .  glipiZIDE (GLUCOTROL) 10 MG tablet, TAKE 1 TABLET TWICE A DAY BEFORE MEALS, Disp: 180 tablet, Rfl: 0 .  glucose blood (ONETOUCH VERIO) test strip,  USE TO TEST BLOOD GLUCOSE THREE TIMES A DAY, Disp: 300 each, Rfl: 3 .  ibuprofen (ADVIL,MOTRIN) 600 MG tablet, Take 1 tablet (600 mg total) by mouth every 8 (eight) hours as needed., Disp: 20 tablet, Rfl: 0 .  levETIRAcetam (KEPPRA) 500 MG tablet, Take 1 tablet (500 mg total) by mouth 2 (two) times daily. X 1 week then 1000 mg (2 tabs) by mouth 2x/day, Disp: 120 tablet, Rfl: 5 .  levothyroxine (SYNTHROID) 137 MCG tablet, Take 1 tablet (137 mcg total) by mouth daily before breakfast., Disp: 90 tablet, Rfl: 2 .  lidocaine (XYLOCAINE) 5 % ointment, Apply 1 application topically 4 (four) times daily as needed. On bottom of feet B/L, Disp: 35.44 g, Rfl: 0 .  metFORMIN (GLUCOPHAGE) 1000 MG tablet, TAKE 1 TABLET DAILY WITH BREAKFAST, Disp: 90 tablet, Rfl: 0 .  nitroGLYCERIN (NITROSTAT) 0.4 MG SL tablet, Place 1 tablet (0.4 mg total) under the tongue every 5 (five) minutes as needed for chest pain (CP or SOB)., Disp: 60 tablet, Rfl: 12 .  ONE TOUCH LANCETS MISC, Test blood sugars once daily Dx: E11.9, Disp: 100 each, Rfl: 3 .  Oxcarbazepine (TRILEPTAL) 300 MG tablet, Take 1 tablet (300 mg total) by mouth at bedtime. X 1 week then 600 mg QHS x 1 week then 900 mg QHS, Disp: 90 tablet, Rfl: 5 .  risperiDONE (RISPERDAL) 1 MG tablet, Take 1 mg by mouth 2 (two) times daily., Disp: , Rfl: 2 .  simvastatin (ZOCOR) 20 MG tablet, Take 1 tablet (20 mg total) by mouth at bedtime., Disp: 90 tablet, Rfl: 2 .  traZODone (DESYREL) 100 MG tablet, Take 1 tablet (100 mg total) by mouth at bedtime. Reported on 09/19/2015, Disp: 90 tablet, Rfl: 1  EXAM:  VITALS per patient if applicable:  GENERAL: alert, oriented, appears well and in no acute distress  HEENT: atraumatic, conjunttiva clear, no obvious abnormalities on inspection of external nose and ears  NECK: normal movements of the head and neck  LUNGS: on inspection no signs of respiratory distress, breathing rate appears normal, no obvious gross SOB, gasping or  wheezing  CV: no obvious cyanosis  MS: moves all visible extremities without noticeable abnormality  PSYCH/NEURO: pleasant and cooperative, no obvious depression or anxiety, speech and thought processing grossly intact  ASSESSMENT AND PLAN:  Discussed the following assessment and plan:  1. Type 2 diabetes mellitus with diabetic neuropathy, without long-term current use of insulin (HCC) -Her diet and lack of exercise have been a longstanding issue for the patient.  She was encouraged to start walking and eventually build up the time that she is exercising.  She needs to start working on a heart healthy diet.  Can consider increasing Trulicity in the future. - Hemoglobin A1c; Future - Basic Metabolic Panel; Future - metFORMIN (GLUCOPHAGE) 1000 MG tablet; Take 1 tablet (1,000 mg total) by mouth daily  with breakfast.  Dispense: 90 tablet; Refill: 0     I discussed the assessment and treatment plan with the patient. The patient was provided an opportunity to ask questions and all were answered. The patient agreed with the plan and demonstrated an understanding of the instructions.   The patient was advised to call back or seek an in-person evaluation if the symptoms worsen or if the condition fails to improve as anticipated.    , NP   

## 2019-07-29 ENCOUNTER — Other Ambulatory Visit (INDEPENDENT_AMBULATORY_CARE_PROVIDER_SITE_OTHER): Payer: BC Managed Care – PPO

## 2019-07-29 DIAGNOSIS — E1140 Type 2 diabetes mellitus with diabetic neuropathy, unspecified: Secondary | ICD-10-CM | POA: Diagnosis not present

## 2019-07-29 LAB — BASIC METABOLIC PANEL
BUN: 7 mg/dL (ref 6–23)
CO2: 31 mEq/L (ref 19–32)
Calcium: 8.8 mg/dL (ref 8.4–10.5)
Chloride: 101 mEq/L (ref 96–112)
Creatinine, Ser: 0.82 mg/dL (ref 0.40–1.20)
GFR: 86.23 mL/min (ref >60.00)
Glucose, Bld: 226 mg/dL — ABNORMAL HIGH (ref 70–99)
Potassium: 3.3 mEq/L — ABNORMAL LOW (ref 3.5–5.1)
Sodium: 140 mEq/L (ref 135–145)

## 2019-07-29 LAB — HEMOGLOBIN A1C: Hgb A1c MFr Bld: 7.3 % — ABNORMAL HIGH (ref 4.6–6.5)

## 2019-10-12 ENCOUNTER — Other Ambulatory Visit: Payer: Self-pay | Admitting: Adult Health

## 2019-10-12 DIAGNOSIS — E1140 Type 2 diabetes mellitus with diabetic neuropathy, unspecified: Secondary | ICD-10-CM

## 2019-10-12 DIAGNOSIS — E039 Hypothyroidism, unspecified: Secondary | ICD-10-CM

## 2019-10-12 DIAGNOSIS — E7800 Pure hypercholesterolemia, unspecified: Secondary | ICD-10-CM

## 2019-10-13 NOTE — Telephone Encounter (Signed)
NEEDS CPX AND A1C FU

## 2019-10-13 NOTE — Telephone Encounter (Signed)
Filled for 90 days.  Pt has upcoming cpx.

## 2019-10-15 ENCOUNTER — Telehealth: Payer: Self-pay | Admitting: Family Medicine

## 2019-10-15 NOTE — Telephone Encounter (Signed)
Pt notified by telephone.  Nothing further needed.

## 2019-10-15 NOTE — Telephone Encounter (Signed)
PA started on CoverMyMeds and completed on the phone with [employer].  Received approval.    Case ID: [id number] 10/14/2020

## 2019-10-18 ENCOUNTER — Other Ambulatory Visit: Payer: Self-pay | Admitting: Adult Health

## 2019-10-18 DIAGNOSIS — E119 Type 2 diabetes mellitus without complications: Secondary | ICD-10-CM

## 2019-10-26 ENCOUNTER — Other Ambulatory Visit: Payer: Self-pay

## 2019-10-27 ENCOUNTER — Telehealth: Payer: Self-pay | Admitting: Adult Health

## 2019-10-27 ENCOUNTER — Encounter: Payer: Self-pay | Admitting: Adult Health

## 2019-10-27 ENCOUNTER — Ambulatory Visit (INDEPENDENT_AMBULATORY_CARE_PROVIDER_SITE_OTHER): Payer: BC Managed Care – PPO | Admitting: Adult Health

## 2019-10-27 VITALS — BP 114/76 | Temp 97.6°F | Wt 177.0 lb

## 2019-10-27 DIAGNOSIS — Z0000 Encounter for general adult medical examination without abnormal findings: Secondary | ICD-10-CM | POA: Diagnosis not present

## 2019-10-27 DIAGNOSIS — I10 Essential (primary) hypertension: Secondary | ICD-10-CM

## 2019-10-27 DIAGNOSIS — E1140 Type 2 diabetes mellitus with diabetic neuropathy, unspecified: Secondary | ICD-10-CM | POA: Diagnosis not present

## 2019-10-27 DIAGNOSIS — N3944 Nocturnal enuresis: Secondary | ICD-10-CM

## 2019-10-27 DIAGNOSIS — E039 Hypothyroidism, unspecified: Secondary | ICD-10-CM

## 2019-10-27 DIAGNOSIS — E7800 Pure hypercholesterolemia, unspecified: Secondary | ICD-10-CM | POA: Diagnosis not present

## 2019-10-27 DIAGNOSIS — G629 Polyneuropathy, unspecified: Secondary | ICD-10-CM

## 2019-10-27 LAB — CBC WITH DIFFERENTIAL/PLATELET
Basophils Absolute: 0 10*3/uL (ref 0.0–0.1)
Basophils Relative: 0.4 % (ref 0.0–3.0)
Eosinophils Absolute: 0.2 10*3/uL (ref 0.0–0.7)
Eosinophils Relative: 2.1 % (ref 0.0–5.0)
HCT: 39.9 % (ref 36.0–46.0)
Hemoglobin: 13.8 g/dL (ref 12.0–15.0)
Lymphocytes Relative: 36.6 % (ref 12.0–46.0)
Lymphs Abs: 3.5 10*3/uL (ref 0.7–4.0)
MCHC: 34.5 g/dL (ref 30.0–36.0)
MCV: 93.7 fl (ref 78.0–100.0)
Monocytes Absolute: 0.7 10*3/uL (ref 0.1–1.0)
Monocytes Relative: 7.6 % (ref 3.0–12.0)
Neutro Abs: 5.1 10*3/uL (ref 1.4–7.7)
Neutrophils Relative %: 53.3 % (ref 43.0–77.0)
Platelets: 356 10*3/uL (ref 150.0–400.0)
RBC: 4.26 Mil/uL (ref 3.87–5.11)
RDW: 13.7 % (ref 11.5–15.5)
WBC: 9.5 10*3/uL (ref 4.0–10.5)

## 2019-10-27 LAB — URINALYSIS
Bilirubin Urine: NEGATIVE
Hgb urine dipstick: NEGATIVE
Ketones, ur: NEGATIVE
Leukocytes,Ua: NEGATIVE
Nitrite: NEGATIVE
Specific Gravity, Urine: >=1.03 — AB (ref 1.000–1.030)
Total Protein, Urine: NEGATIVE
Urine Glucose: NEGATIVE
Urobilinogen, UA: 2 — AB (ref 0.0–1.0)
pH: 5.5 (ref 5.0–8.0)

## 2019-10-27 LAB — COMPREHENSIVE METABOLIC PANEL
ALT: 30 U/L (ref 0–35)
AST: 24 U/L (ref 0–37)
Albumin: 4 g/dL (ref 3.5–5.2)
Alkaline Phosphatase: 74 U/L (ref 39–117)
BUN: 6 mg/dL (ref 6–23)
CO2: 31 mEq/L (ref 19–32)
Calcium: 9 mg/dL (ref 8.4–10.5)
Chloride: 103 mEq/L (ref 96–112)
Creatinine, Ser: 0.65 mg/dL (ref 0.40–1.20)
GFR: 112.65 mL/min (ref >60.00)
Glucose, Bld: 156 mg/dL — ABNORMAL HIGH (ref 70–99)
Potassium: 3.8 mEq/L (ref 3.5–5.1)
Sodium: 142 mEq/L (ref 135–145)
Total Bilirubin: 0.6 mg/dL (ref 0.2–1.2)
Total Protein: 6.7 g/dL (ref 6.0–8.3)

## 2019-10-27 LAB — HEMOGLOBIN A1C: Hgb A1c MFr Bld: 6.9 % — ABNORMAL HIGH (ref 4.6–6.5)

## 2019-10-27 LAB — LIPID PANEL
Cholesterol: 116 mg/dL (ref 0–200)
HDL: 29.6 mg/dL — ABNORMAL LOW (ref >39.00)
LDL Cholesterol: 71 mg/dL (ref 0–99)
NonHDL: 85.93
Total CHOL/HDL Ratio: 4
Triglycerides: 76 mg/dL (ref 0.0–149.0)
VLDL: 15.2 mg/dL (ref 0.0–40.0)

## 2019-10-27 LAB — TSH: TSH: 0.06 u[IU]/mL — ABNORMAL LOW (ref 0.35–4.50)

## 2019-10-27 MED ORDER — LEVOTHYROXINE SODIUM 125 MCG PO TABS: 125.0000 ug | ORAL_TABLET | Freq: Every day | ORAL | 0 refills | Status: DC

## 2019-10-27 NOTE — Progress Notes (Signed)
Subjective:    Patient ID: Alexandra [name], female    DOB: 1961-05-25, 59 y.o.   MRN: [id number]  HPI Patient presents for yearly preventative medicine examination. She is a pleasant 59 year old female who  has a past medical history of Diabetes mellitus ([place]), Fatty liver, GERD (gastroesophageal reflux disease), Hiatal hernia (1998), Hyperlipidemia, Hyperplastic colon polyp, Hypertension, Hyperthyroidism, Hypothyroidism, Rotator cuff tear, right, and Sleep apnea.  DM -currently maintained on Trulicity 1.5 mg, glipizide 10 mg twice daily, and Metformin 1000 mg daily.  Her last A1c in December 2020 had dropped from 8.3-7.3.  She does monitor her blood sugars at home and reports readings in the 110-150. She does have very infrequent episodes of hypoglycemia.  She denies changes to diet continues to drink Pepsi and eat a nondiabetic diet.  She has also not been exercising.  Despite this she has been able to lose approximately 9 pounds for the last 5 months.  She does report that her appetite is good and she is feeling well  Essential Hypertension -controlled with Ziac 10-6.25 mg.  She denies dizziness, lightheadedness, chest pain, or shortness of breath BP Readings from Last 3 Encounters:  10/27/19 114/76  05/28/19 106/70  05/13/19 116/79   Hypothyroidism -Currently prescribed Synthroid 137 mcg.  She feels well controlled  Hyperlipidemia -currently prescribed simvastatin 20 mg nightly.  She denies myalgia or fatigue Lab Results  Component Value Date   CHOL 103 04/10/2018   HDL 29.00 (L) 04/10/2018   LDLCALC 57 04/10/2018   TRIG 84.0 04/10/2018   CHOLHDL 4 04/10/2018   Diabetic neuropathy -currently prescribed gabapentin 800 mg 3 times daily.  She does not know how well this medication works for her but she reports "I can definitely feel it when I do not take it".  Acute Issues  Bladder Incontinence -she reports that over the last few months she has been experiencing more frequent  bladder incontinence.  She reports that she often "wets herself while sleeping".  She does have the sensation of urgency during the day but is able to make it to the bathroom without difficulty.  She denies dysuria or hematuria.   All immunizations and health maintenance protocols were reviewed with the patient and needed orders were placed. She is up to date on vaccinations   Appropriate screening laboratory values were ordered for the patient including screening of hyperlipidemia, renal function and hepatic function.  Medication reconciliation,  past medical history, social history, problem list and allergies were reviewed in detail with the patient  Goals were established with regard to weight loss, exercise, and  diet in compliance with medications  Wt Readings from Last 3 Encounters:  10/27/19 177 lb (80.3 kg)  05/28/19 186 lb (84.4 kg)  05/13/19 190 lb (86.2 kg)   She is up-to-date on routine mammogram and colonoscopy   Review of Systems  Constitutional: Negative.   HENT: Negative.   Eyes: Negative.   Respiratory: Negative.   Cardiovascular: Negative.   Gastrointestinal: Negative.   Endocrine: Negative.   Genitourinary: Positive for urgency. Negative for difficulty urinating, flank pain, frequency and hematuria.  Musculoskeletal: Positive for arthralgias and back pain.  Skin: Negative.   Allergic/Immunologic: Negative.   Neurological: Negative.   Hematological: Negative.   Psychiatric/Behavioral: Negative.      Past Medical History:  Diagnosis Date  . Diabetes mellitus ([place])   . Fatty liver   . GERD (gastroesophageal reflux disease)    prn med.  . Hiatal hernia 1998  .  Hyperlipidemia   . Hyperplastic colon polyp   . Hypertension    new dx. - will start med. 06/28/2011  . Hyperthyroidism   . Hypothyroidism   . Rotator cuff tear, right   . Sleep apnea    prior to gastric bypass - no sleep study since bypass    Social History   Socioeconomic History  .  Marital status: Married    Spouse name: Not on file  . Number of children: Not on file  . Years of education: Not on file  . Highest education level: Not on file  Occupational History  . Occupation: unemployed  Tobacco Use  . Smoking status: Former Smoker    Packs/day: 1.00    Years: 10.00    Pack years: 10.00    Types: Cigarettes    Quit date: 07/29/1984    Years since quitting: 35.2  . Smokeless tobacco: Never Used  Substance and Sexual Activity  . Alcohol use: Yes    Comment: occassionally  . Drug use: No  . Sexual activity: Yes  Other Topics Concern  . Not on file  Social History Narrative   Married for 24 years    Six children- all live around here. Two daughters live with her   Six grand children    On Disability for mental issues   No pets   Likes to go to movies and out to dinner. She likes to do puzzle books. Likes to read      She eats a healthy diet   She does not exercise          Social Determinants of Health   Financial Resource Strain:   . Difficulty of Paying Living Expenses:   Food Insecurity:   . Worried About Charity fundraiser in the Last Year:   . Arboriculturist in the Last Year:   Transportation Needs:   . Film/video editor (Medical):   Marland Kitchen Lack of Transportation (Non-Medical):   Physical Activity:   . Days of Exercise per Week:   . Minutes of Exercise per Session:   Stress:   . Feeling of Stress :   Social Connections:   . Frequency of Communication with Friends and Family:   . Frequency of Social Gatherings with Friends and Family:   . Attends Religious Services:   . Active Member of Clubs or Organizations:   . Attends Archivist Meetings:   Marland Kitchen Marital Status:   Intimate Partner Violence:   . Fear of Current or Ex-Partner:   . Emotionally Abused:   Marland Kitchen Physically Abused:   . Sexually Abused:     Past Surgical History:  Procedure Laterality Date  . ABDOMINAL HYSTERECTOMY  06/25/2000   left salpingo-oophorectomy  .  BACK SURGERY  1997   discetomy, lumbar spinal fusion  . CARDIAC CATHETERIZATION  04/01/2006  . CESAREAN SECTION  1991  . CHOLECYSTECTOMY  1989  . EXPLORATORY LAPAROTOMY  05/08/2000   right salpingo-oophorectomy  . LAMINOTOMY / EXCISION DISK POSTERIOR CERVICAL SPINE  08/10/2009   C7-T1  . 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 SHOULDER ARTHROSCOPY  01/02/2006; 05/31/2004   left 2007; right 2005  . SHOULDER ARTHROSCOPY  07/02/2011   Procedure: ARTHROSCOPY SHOULDER;  Surgeon: Cammie Sickle., MD;  Location: [facility];  Service: Orthopedics;  Laterality: Right;  repair supraspinatus, Debride glenohumeral joint, labrum  . SHOULDER ARTHROSCOPY DISTAL CLAVICLE EXCISION AND OPEN ROTATOR CUFF REPAIR  07/26/2010   right  .  THYROID SURGERY  early 2000s   goiter  . TUBAL LIGATION      Family History  Problem Relation Age of Onset  . Coronary artery disease Other   . Cancer Paternal Grandfather        Prostate Cancer  . Diabetes Mother   . Hyperlipidemia Mother   . Hypertension Mother   . Thyroid disease Mother   . Rheum arthritis Mother   . COPD Mother     Allergies  Allergen Reactions  . Shrimp Flavor Swelling    Swelling of tongue and rash  . Ace Inhibitors Cough    Current Outpatient Medications on File Prior to Visit  Medication Sig Dispense Refill  . bisoprolol-hydrochlorothiazide (ZIAC) 10-6.25 MG tablet TAKE 1 TABLET DAILY 90 tablet 3  . Blood Glucose Monitoring Suppl (ONETOUCH VERIO) w/Device KIT 1 Units by Does not apply route daily. 1 kit 0  . citalopram (CELEXA) 40 MG tablet Take 1 tablet (40 mg total) by mouth daily. Reported on 09/19/2015 90 tablet 1  . fluticasone (FLONASE) 50 MCG/ACT nasal spray USE 2 SPRAYS IN EACH NOSTRIL DAILY 48 g 0  . gabapentin (NEURONTIN) 400 MG capsule TAKE 2 CAPSULES THREE TIMES A DAY 540 capsule 0  . glipiZIDE (GLUCOTROL) 10 MG tablet TAKE 1 TABLET TWICE A DAY BEFORE MEALS 180 tablet 0  . glucose blood (ONETOUCH VERIO) test  strip USE TO TEST BLOOD GLUCOSE THREE TIMES A DAY 300 each 3  . ibuprofen (ADVIL,MOTRIN) 600 MG tablet Take 1 tablet (600 mg total) by mouth every 8 (eight) hours as needed. 20 tablet 0  . levothyroxine (SYNTHROID) 137 MCG tablet TAKE 1 TABLET DAILY BEFORE BREAKFAST 90 tablet 0  . lidocaine (XYLOCAINE) 5 % ointment Apply 1 application topically 4 (four) times daily as needed. On bottom of feet B/L 35.44 g 0  . metFORMIN (GLUCOPHAGE) 1000 MG tablet TAKE 1 TABLET DAILY WITH BREAKFAST 90 tablet 0  . nitroGLYCERIN (NITROSTAT) 0.4 MG SL tablet Place 1 tablet (0.4 mg total) under the tongue every 5 (five) minutes as needed for chest pain (CP or SOB). 60 tablet 12  . ONE TOUCH LANCETS MISC Test blood sugars once daily Dx: E11.9 100 each 3  . Oxcarbazepine (TRILEPTAL) 300 MG tablet Take 1 tablet (300 mg total) by mouth at bedtime. X 1 week then 600 mg QHS x 1 week then 900 mg QHS 90 tablet 5  . risperiDONE (RISPERDAL) 1 MG tablet Take 1 mg by mouth 2 (two) times daily.  2  . simvastatin (ZOCOR) 20 MG tablet TAKE 1 TABLET AT BEDTIME 90 tablet 0  . traZODone (DESYREL) 100 MG tablet Take 1 tablet (100 mg total) by mouth at bedtime. Reported on 09/19/2015 90 tablet 1  . TRULICITY 1.5 XY/8.0XK SOPN INJECT 1.5 MG UNDER THE SKIN ONCE A WEEK 6 mL 3   No current facility-administered medications on file prior to visit.    BP 114/76   Temp 97.6 F (36.4 C) (Temporal)   Wt 177 lb (80.3 kg)   BMI 32.37 kg/m       Objective:   Physical Exam Vitals and nursing note reviewed.  Constitutional:      General: She is not in acute distress.    Appearance: Normal appearance. She is well-developed. She is not ill-appearing.  HENT:     Head: Normocephalic and atraumatic.     Right Ear: Tympanic membrane, ear canal and external ear normal. There is no impacted cerumen.     Left  Ear: Tympanic membrane, ear canal and external ear normal. There is no impacted cerumen.     Nose: Nose normal. No congestion or  rhinorrhea.     Mouth/Throat:     Mouth: Mucous membranes are moist.     Pharynx: Oropharynx is clear. No oropharyngeal exudate or posterior oropharyngeal erythema.  Eyes:     General:        Right eye: No discharge.        Left eye: No discharge.     Extraocular Movements: Extraocular movements intact.     Conjunctiva/sclera: Conjunctivae normal.     Pupils: Pupils are equal, round, and reactive to light.  Neck:     Thyroid: No thyromegaly.     Vascular: No carotid bruit.     Trachea: No tracheal deviation.  Cardiovascular:     Rate and Rhythm: Normal rate and regular rhythm.     Pulses: Normal pulses.     Heart sounds: Normal heart sounds. No murmur. No friction rub. No gallop.   Pulmonary:     Effort: Pulmonary effort is normal. No respiratory distress.     Breath sounds: Normal breath sounds. No stridor. No wheezing, rhonchi or rales.  Chest:     Chest wall: No tenderness.  Abdominal:     General: Abdomen is flat. Bowel sounds are normal. There is no distension.     Palpations: Abdomen is soft. There is no mass.     Tenderness: There is no abdominal tenderness. There is no right CVA tenderness, left CVA tenderness, guarding or rebound.     Hernia: No hernia is present.  Musculoskeletal:        General: No swelling, tenderness, deformity or signs of injury. Normal range of motion.     Cervical back: Normal range of motion and neck supple.     Right lower leg: No edema.     Left lower leg: No edema.  Lymphadenopathy:     Cervical: No cervical adenopathy.  Skin:    General: Skin is warm and dry.     Coloration: Skin is not jaundiced or pale.     Findings: No bruising, erythema, lesion or rash.  Neurological:     General: No focal deficit present.     Mental Status: She is alert and oriented to person, place, and time.     Cranial Nerves: No cranial nerve deficit.     Sensory: No sensory deficit.     Motor: No weakness.     Coordination: Coordination normal.     Gait:  Gait normal.     Deep Tendon Reflexes: Reflexes normal.  Psychiatric:        Mood and Affect: Mood normal.        Behavior: Behavior normal.        Thought Content: Thought content normal.        Judgment: Judgment normal.        Assessment & Plan:  1. Routine general medical examination at a health care facility - CBC with Differential/Platelet - Comprehensive metabolic panel - Hemoglobin A1c - Lipid panel - TSH - Urinalysis  2. Type 2 diabetes mellitus with diabetic neuropathy, without long-term current use of insulin ([place]) - needs to work on diet and exercise - follow up in three months or sooner if needed - Consider dose change of medications  - CBC with Differential/Platelet - Comprehensive metabolic panel - Hemoglobin A1c - Lipid panel - TSH - Urinalysis  3. Pure hypercholesterolemia - Consider increase  in synthroid  - CBC with Differential/Platelet - Comprehensive metabolic panel - Hemoglobin A1c - Lipid panel - TSH - Urinalysis  4. Hypothyroidism, unspecified type -Consider change in synthroid  - CBC with Differential/Platelet - Comprehensive metabolic panel - Hemoglobin A1c - Lipid panel - TSH - Urinalysis  5. Essential hypertension -No change in medication - CBC with Differential/Platelet - Comprehensive metabolic panel - Hemoglobin A1c - Lipid panel - TSH - Urinalysis  6. Neuropathy -Continue with gabapentin 800 mg 3 times daily.  I believe this medication is helping more than she realizes   7. Nocturnal enuresis - CBC with Differential/Platelet - Comprehensive metabolic panel - Hemoglobin A1c - TSH - Urinalysis   Dorothyann Peng, NP

## 2019-10-27 NOTE — Telephone Encounter (Signed)
Updated patient on her labs.  Her A1c has improved and we will keep her on the current medication.  Her TSH was 0.6 I am going to lower her dose and have her follow-up in 1 month for recheck.  I am wondering if this is causing her urinary incontinence.

## 2019-11-16 ENCOUNTER — Other Ambulatory Visit: Payer: Self-pay | Admitting: Physical Medicine and Rehabilitation

## 2019-12-09 ENCOUNTER — Other Ambulatory Visit: Payer: Self-pay | Admitting: Physical Medicine and Rehabilitation

## 2020-01-07 LAB — HM DIABETES EYE EXAM

## 2020-01-11 ENCOUNTER — Encounter: Payer: Self-pay | Admitting: Family Medicine

## 2020-01-17 ENCOUNTER — Other Ambulatory Visit: Payer: Self-pay | Admitting: Adult Health

## 2020-01-17 DIAGNOSIS — E1140 Type 2 diabetes mellitus with diabetic neuropathy, unspecified: Secondary | ICD-10-CM

## 2020-01-17 DIAGNOSIS — E7800 Pure hypercholesterolemia, unspecified: Secondary | ICD-10-CM

## 2020-01-17 DIAGNOSIS — E039 Hypothyroidism, unspecified: Secondary | ICD-10-CM

## 2020-01-18 NOTE — Telephone Encounter (Signed)
ALL MEDICATIONS SENT TO THE PHARMACY BY E-SCRIBE EXCEPT FOR DIABETES MEDS.  SHE IS PAST DUE FOR FOLLOW UP.

## 2020-01-28 ENCOUNTER — Encounter: Payer: Self-pay | Admitting: Adult Health

## 2020-01-28 ENCOUNTER — Other Ambulatory Visit: Payer: Self-pay

## 2020-01-28 ENCOUNTER — Ambulatory Visit (INDEPENDENT_AMBULATORY_CARE_PROVIDER_SITE_OTHER): Payer: BC Managed Care – PPO | Admitting: Adult Health

## 2020-01-28 VITALS — BP 104/70 | Temp 98.0°F | Wt 180.0 lb

## 2020-01-28 DIAGNOSIS — E1140 Type 2 diabetes mellitus with diabetic neuropathy, unspecified: Secondary | ICD-10-CM

## 2020-01-28 DIAGNOSIS — E039 Hypothyroidism, unspecified: Secondary | ICD-10-CM

## 2020-01-28 DIAGNOSIS — N3281 Overactive bladder: Secondary | ICD-10-CM | POA: Diagnosis not present

## 2020-01-28 LAB — POCT GLYCOSYLATED HEMOGLOBIN (HGB A1C): HbA1c, POC (controlled diabetic range): 6.9 % (ref 0.0–7.0)

## 2020-01-28 LAB — TSH: TSH: 0.39 mIU/L — ABNORMAL LOW (ref 0.40–4.50)

## 2020-01-28 MED ORDER — NITROGLYCERIN 0.4 MG SL SUBL: 0.4000 mg | SUBLINGUAL_TABLET | SUBLINGUAL | 12 refills | Status: DC | PRN

## 2020-01-28 MED ORDER — METFORMIN HCL 1000 MG PO TABS: 1000.0000 mg | ORAL_TABLET | Freq: Every day | ORAL | 0 refills | Status: DC

## 2020-01-28 MED ORDER — MIRABEGRON ER 25 MG PO TB24: 25.0000 mg | ORAL_TABLET | Freq: Every day | ORAL | 0 refills | Status: DC

## 2020-01-28 MED ORDER — GLIPIZIDE 10 MG PO TABS: ORAL_TABLET | ORAL | 0 refills | Status: DC

## 2020-01-28 NOTE — Progress Notes (Signed)
Subjective:    Patient ID: Alexandra [name], female    DOB: 12/02/1960, 59 y.o.   MRN: [id number]  HPI  58 year old female who  has a past medical history of Diabetes mellitus ([place]), Fatty liver, GERD (gastroesophageal reflux disease), Hiatal hernia (1998), Hyperlipidemia, Hyperplastic colon polyp, Hypertension, Hyperthyroidism, Hypothyroidism, Rotator cuff tear, right, and Sleep apnea.  She presents to the office today for three month follow up regarding DM. She is currently maintained on Glipizide 10 mg BID, Metformin 5093 mg BID, and Trulicity 1.5 mg weekly. She has been monitoring her blood sugars at home and reports readings of 110-150 with a few higher readings depending on what she eats.  She has had one feeling of hypoglycemia over the last 3 months but did not check her blood sugar at this time.  This lasted about 4 minutes and then resolved after she ate something.  She is staying active but not exercising and continues to work on diet.   Her last A1c 3 months ago was 6.9  Wt Readings from Last 3 Encounters:  01/28/20 180 lb (81.6 kg)  10/27/19 177 lb (80.3 kg)  05/28/19 186 lb (84.4 kg)   Additionally, she continues to have issues with urinary incontinence.  Happens more at night when she is asleep and she will often wet the bed.  During the day she does feel the urge and will have some dribbling but is usually able to make it to the bathroom without difficulty.  She denies symptoms of UTI  During her CPE it was noted that her TSH was 0.06.  Synthroid was dose adjusted down to 125 mcg from 137 mcg.  She was advised to follow-up in 1 month never did.  We will recheck her TSH today Review of Systems See HPI   Past Medical History:  Diagnosis Date  . Diabetes mellitus ([place])   . Fatty liver   . GERD (gastroesophageal reflux disease)    prn med.  . Hiatal hernia 1998  . Hyperlipidemia   . Hyperplastic colon polyp   . Hypertension    new dx. - will start med. 06/28/2011    . Hyperthyroidism   . Hypothyroidism   . Rotator cuff tear, right   . Sleep apnea    prior to gastric bypass - no sleep study since bypass    Social History   Socioeconomic History  . Marital status: Married    Spouse name: Not on file  . Number of children: Not on file  . Years of education: Not on file  . Highest education level: Not on file  Occupational History  . Occupation: unemployed  Tobacco Use  . Smoking status: Former Smoker    Packs/day: 1.00    Years: 10.00    Pack years: 10.00    Types: Cigarettes    Quit date: 07/29/1984    Years since quitting: 35.5  . Smokeless tobacco: Never Used  Vaping Use  . Vaping Use: Never used  Substance and Sexual Activity  . Alcohol use: Yes    Comment: occassionally  . Drug use: No  . Sexual activity: Yes  Other Topics Concern  . Not on file  Social History Narrative   Married for 24 years    Six children- all live around here. Two daughters live with her   Six grand children    On Disability for mental issues   No pets   Likes to go to movies and out to dinner. She  likes to do puzzle books. Likes to read      She eats a healthy diet   She does not exercise          Social Determinants of Health   Financial Resource Strain:   . Difficulty of Paying Living Expenses:   Food Insecurity:   . Worried About Charity fundraiser in the Last Year:   . Arboriculturist in the Last Year:   Transportation Needs:   . Film/video editor (Medical):   Marland Kitchen Lack of Transportation (Non-Medical):   Physical Activity:   . Days of Exercise per Week:   . Minutes of Exercise per Session:   Stress:   . Feeling of Stress :   Social Connections:   . Frequency of Communication with Friends and Family:   . Frequency of Social Gatherings with Friends and Family:   . Attends Religious Services:   . Active Member of Clubs or Organizations:   . Attends Archivist Meetings:   Marland Kitchen Marital Status:   Intimate Partner Violence:    . Fear of Current or Ex-Partner:   . Emotionally Abused:   Marland Kitchen Physically Abused:   . Sexually Abused:     Past Surgical History:  Procedure Laterality Date  . ABDOMINAL HYSTERECTOMY  06/25/2000   left salpingo-oophorectomy  . BACK SURGERY  1997   discetomy, lumbar spinal fusion  . CARDIAC CATHETERIZATION  04/01/2006  . CESAREAN SECTION  1991  . CHOLECYSTECTOMY  1989  . EXPLORATORY LAPAROTOMY  05/08/2000   right salpingo-oophorectomy  . LAMINOTOMY / EXCISION DISK POSTERIOR CERVICAL SPINE  08/10/2009   C7-T1  . 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 SHOULDER ARTHROSCOPY  01/02/2006; 05/31/2004   left 2007; right 2005  . SHOULDER ARTHROSCOPY  07/02/2011   Procedure: ARTHROSCOPY SHOULDER;  Surgeon: Cammie Sickle., MD;  Location: [facility];  Service: Orthopedics;  Laterality: Right;  repair supraspinatus, Debride glenohumeral joint, labrum  . SHOULDER ARTHROSCOPY DISTAL CLAVICLE EXCISION AND OPEN ROTATOR CUFF REPAIR  07/26/2010   right  . THYROID SURGERY  early 2000s   goiter  . TUBAL LIGATION      Family History  Problem Relation Age of Onset  . Coronary artery disease Other   . Cancer Paternal Grandfather        Prostate Cancer  . Diabetes Mother   . Hyperlipidemia Mother   . Hypertension Mother   . Thyroid disease Mother   . Rheum arthritis Mother   . COPD Mother     Allergies  Allergen Reactions  . Shrimp Flavor Swelling    Swelling of tongue and rash  . Ace Inhibitors Cough    Current Outpatient Medications on File Prior to Visit  Medication Sig Dispense Refill  . bisoprolol-hydrochlorothiazide (ZIAC) 10-6.25 MG tablet TAKE 1 TABLET DAILY 90 tablet 3  . Blood Glucose Monitoring Suppl (ONETOUCH VERIO) w/Device KIT 1 Units by Does not apply route daily. 1 kit 0  . citalopram (CELEXA) 40 MG tablet Take 1 tablet (40 mg total) by mouth daily. Reported on 09/19/2015 90 tablet 1  . fluticasone (FLONASE) 50 MCG/ACT nasal spray USE 2 SPRAYS IN EACH  NOSTRIL DAILY 48 g 1  . gabapentin (NEURONTIN) 400 MG capsule TAKE 2 CAPSULES THREE TIMES A DAY 540 capsule 1  . glucose blood (ONETOUCH VERIO) test strip USE TO TEST BLOOD GLUCOSE THREE TIMES A DAY 300 each 3  . ibuprofen (ADVIL,MOTRIN) 600 MG tablet Take 1 tablet (  600 mg total) by mouth every 8 (eight) hours as needed. 20 tablet 0  . levETIRAcetam (KEPPRA) 1000 MG tablet Take 1 tablet (1,000 mg total) by mouth 2 (two) times daily. Pt will need to be seen to get another refill in future 60 tablet 1  . levothyroxine (SYNTHROID) 125 MCG tablet TAKE 1 TABLET DAILY BEFORE BREAKFAST 90 tablet 1  . lidocaine (XYLOCAINE) 5 % ointment Apply 1 application topically 4 (four) times daily as needed. On bottom of feet B/L 35.44 g 0  . ONE TOUCH LANCETS MISC Test blood sugars once daily Dx: E11.9 100 each 3  . risperiDONE (RISPERDAL) 1 MG tablet Take 1 mg by mouth 2 (two) times daily.  2  . simvastatin (ZOCOR) 20 MG tablet TAKE 1 TABLET AT BEDTIME 90 tablet 1  . traZODone (DESYREL) 100 MG tablet Take 1 tablet (100 mg total) by mouth at bedtime. Reported on 09/19/2015 90 tablet 1  . TRULICITY 1.5 KP/5.3ZS SOPN INJECT 1.5 MG UNDER THE SKIN ONCE A WEEK 6 mL 3   No current facility-administered medications on file prior to visit.    BP 104/70   Temp 98 F (36.7 C)   Wt 180 lb (81.6 kg)   BMI 32.92 kg/m       Objective:   Physical Exam Vitals and nursing note reviewed.  Constitutional:      Appearance: Normal appearance.  Cardiovascular:     Rate and Rhythm: Normal rate and regular rhythm.     Pulses: Normal pulses.     Heart sounds: Normal heart sounds.  Pulmonary:     Effort: Pulmonary effort is normal.     Breath sounds: Normal breath sounds.  Abdominal:     General: Abdomen is flat. Bowel sounds are normal.     Palpations: Abdomen is soft.  Musculoskeletal:        General: Normal range of motion.  Skin:    General: Skin is warm.  Neurological:     General: No focal deficit present.      Mental Status: She is alert and oriented to person, place, and time.  Psychiatric:        Mood and Affect: Mood normal.        Behavior: Behavior normal.        Thought Content: Thought content normal.        Judgment: Judgment normal.        Assessment & Plan:  1. Type 2 diabetes mellitus with diabetic neuropathy, without long-term current use of insulin ([facility])  - POCT A1C- 6.9 -no change but still at goal.  No change in medication at this time.  She was advised to continue with weight loss measures through diet and exercise - Follow up in 3 months  - metFORMIN (GLUCOPHAGE) 1000 MG tablet; Take 1 tablet (1,000 mg total) by mouth daily with breakfast.  Dispense: 90 tablet; Refill: 0 - glipiZIDE (GLUCOTROL) 10 MG tablet; TAKE 1 TABLET TWICE A DAY BEFORE MEALS  Dispense: 180 tablet; Refill: 0  2. OAB (overactive bladder) - mirabegron ER (MYRBETRIQ) 25 MG TB24 tablet; Take 1 tablet (25 mg total) by mouth daily.  Dispense: 90 tablet; Refill: 0 - Follow up in three months   3. Hypothyroidism, unspecified type - Consider further titration of Synthroid  - TSH   Dorothyann Peng, NP

## 2020-02-01 ENCOUNTER — Other Ambulatory Visit: Payer: Self-pay | Admitting: Adult Health

## 2020-02-01 ENCOUNTER — Encounter: Payer: Self-pay | Admitting: Adult Health

## 2020-02-01 DIAGNOSIS — E039 Hypothyroidism, unspecified: Secondary | ICD-10-CM

## 2020-02-01 MED ORDER — LEVOTHYROXINE SODIUM 112 MCG PO TABS: 112.0000 ug | ORAL_TABLET | Freq: Every day | ORAL | 0 refills | Status: DC

## 2020-02-01 MED ORDER — OXYBUTYNIN CHLORIDE ER 5 MG PO TB24
5.0000 mg | ORAL_TABLET | Freq: Every day | ORAL | 1 refills | Status: DC
Start: 2020-02-01 — End: 2020-02-24

## 2020-02-14 NOTE — Addendum Note (Signed)
Addended by: Marrion Coy on: 02/14/2020 03:23 PM   Modules accepted: Orders

## 2020-02-23 ENCOUNTER — Other Ambulatory Visit: Payer: Self-pay | Admitting: Adult Health

## 2020-02-23 DIAGNOSIS — E039 Hypothyroidism, unspecified: Secondary | ICD-10-CM

## 2020-02-24 NOTE — Telephone Encounter (Signed)
Sent to the pharmacy by e-scribe. 

## 2020-02-29 ENCOUNTER — Other Ambulatory Visit (INDEPENDENT_AMBULATORY_CARE_PROVIDER_SITE_OTHER): Payer: BC Managed Care – PPO

## 2020-02-29 ENCOUNTER — Other Ambulatory Visit: Payer: Self-pay

## 2020-02-29 DIAGNOSIS — E039 Hypothyroidism, unspecified: Secondary | ICD-10-CM | POA: Diagnosis not present

## 2020-02-29 LAB — TSH: TSH: 4.73 mIU/L — ABNORMAL HIGH (ref 0.40–4.50)

## 2020-04-22 ENCOUNTER — Other Ambulatory Visit: Payer: Self-pay | Admitting: Adult Health

## 2020-04-22 DIAGNOSIS — E1140 Type 2 diabetes mellitus with diabetic neuropathy, unspecified: Secondary | ICD-10-CM

## 2020-05-02 ENCOUNTER — Ambulatory Visit: Payer: BC Managed Care – PPO | Admitting: Adult Health

## 2020-05-04 ENCOUNTER — Encounter: Payer: Self-pay | Admitting: Adult Health

## 2020-05-04 ENCOUNTER — Other Ambulatory Visit: Payer: Self-pay | Admitting: Adult Health

## 2020-05-04 ENCOUNTER — Ambulatory Visit (INDEPENDENT_AMBULATORY_CARE_PROVIDER_SITE_OTHER): Payer: BC Managed Care – PPO | Admitting: Adult Health

## 2020-05-04 ENCOUNTER — Other Ambulatory Visit: Payer: Self-pay

## 2020-05-04 VITALS — BP 100/70 | HR 73 | Temp 97.8°F | Ht 62.0 in | Wt 178.1 lb

## 2020-05-04 DIAGNOSIS — Z23 Encounter for immunization: Secondary | ICD-10-CM | POA: Diagnosis not present

## 2020-05-04 DIAGNOSIS — E1140 Type 2 diabetes mellitus with diabetic neuropathy, unspecified: Secondary | ICD-10-CM | POA: Diagnosis not present

## 2020-05-04 LAB — POCT GLYCOSYLATED HEMOGLOBIN (HGB A1C): Hemoglobin A1C: 6.7 % — AB (ref 4.0–5.6)

## 2020-05-04 MED ORDER — FREESTYLE LIBRE 14 DAY SENSOR MISC: 0 refills | Status: DC

## 2020-05-04 NOTE — Progress Notes (Signed)
Subjective:    Patient ID: Alexandra [name], female    DOB: 21-Jul-1961, 59 y.o.   MRN: [id number]  HPI 59 year old female who  has a past medical history of Diabetes mellitus ([place]), Fatty liver, GERD (gastroesophageal reflux disease), Hiatal hernia (1998), Hyperlipidemia, Hyperplastic colon polyp, Hypertension, Hyperthyroidism, Hypothyroidism, Rotator cuff tear, right, and Sleep apnea.  He presents to the office today for 7-monthfollow-up regarding diabetes mellitus.  She is currently maintained on glipizide 10 mg twice daily, Metformin 1000 mg twice daily, and Trulicity 1.5 mg weekly.  She has been monitoring her blood sugars at home and reports readings of 80-150 with a few higher readings depending on what she eats.  She has had  symptoms of hypoglycemia over the last 3 months about 5 times.   She is trying to stay active but not exercising and continues to work on diet  Wt Readings from Last 3 Encounters:  05/04/20 178 lb 1.6 oz (80.8 kg)  01/28/20 180 lb (81.6 kg)  10/27/19 177 lb (80.3 kg)    She is interested in CGM so that she does not need to stick her fingers multiple times a day   Her last A1c in July 2021 was 6.9   Review of Systems See HPI   Past Medical History:  Diagnosis Date  . Diabetes mellitus ([place]   . Fatty liver   . GERD (gastroesophageal reflux disease)    prn med.  . Hiatal hernia 1998  . Hyperlipidemia   . Hyperplastic colon polyp   . Hypertension    new dx. - will start med. 06/28/2011  . Hyperthyroidism   . Hypothyroidism   . Rotator cuff tear, right   . Sleep apnea    prior to gastric bypass - no sleep study since bypass    Social History   Socioeconomic History  . Marital status: Married    Spouse name: Not on file  . Number of children: Not on file  . Years of education: Not on file  . Highest education level: Not on file  Occupational History  . Occupation: unemployed  Tobacco Use  . Smoking status: Former Smoker    Packs/day:  1.00    Years: 10.00    Pack years: 10.00    Types: Cigarettes    Quit date: 07/29/1984    Years since quitting: 35.7  . Smokeless tobacco: Never Used  Vaping Use  . Vaping Use: Never used  Substance and Sexual Activity  . Alcohol use: Yes    Comment: occassionally  . Drug use: No  . Sexual activity: Yes  Other Topics Concern  . Not on file  Social History Narrative   Married for 24 years    Six children- all live around here. Two daughters live with her   Six grand children    On Disability for mental issues   No pets   Likes to go to movies and out to dinner. She likes to do puzzle books. Likes to read      She eats a healthy diet   She does not exercise          Social Determinants of Health   Financial Resource Strain:   . Difficulty of Paying Living Expenses: Not on file  Food Insecurity:   . Worried About RCharity fundraiserin the Last Year: Not on file  . Ran Out of Food in the Last Year: Not on file  Transportation Needs:   .   Subjective:    Patient ID: Alexandra [name], female    DOB: 04/24/1961, 59 y.o.   MRN: [id number]  HPI 59 year old female who  has a past medical history of Diabetes mellitus (HCC), Fatty liver, GERD (gastroesophageal reflux disease), Hiatal hernia (1998), Hyperlipidemia, Hyperplastic colon polyp, Hypertension, Hyperthyroidism, Hypothyroidism, Rotator cuff tear, right, and Sleep apnea.  He presents to the office today for 3-month follow-up regarding diabetes mellitus.  She is currently maintained on glipizide 10 mg twice daily, Metformin 1000 mg twice daily, and Trulicity 1.5 mg weekly.  She has been monitoring her blood sugars at home and reports readings of 80-150 with a few higher readings depending on what she eats.  She has had  symptoms of hypoglycemia over the last 3 months about 5 times.   She is trying to stay active but not exercising and continues to work on diet  Wt Readings from Last 3 Encounters:  05/04/20 178 lb 1.6 oz (80.8 kg)  01/28/20 180 lb (81.6 kg)  10/27/19 177 lb (80.3 kg)    She is interested in CGM so that she does not need to stick her fingers multiple times a day   Her last A1c in July 2021 was 6.9   Review of Systems See HPI   Past Medical History:  Diagnosis Date  . Diabetes mellitus (HCC)   . Fatty liver   . GERD (gastroesophageal reflux disease)    prn med.  . Hiatal hernia 1998  . Hyperlipidemia   . Hyperplastic colon polyp   . Hypertension    new dx. - will start med. 06/28/2011  . Hyperthyroidism   . Hypothyroidism   . Rotator cuff tear, right   . Sleep apnea    prior to gastric bypass - no sleep study since bypass    Social History   Socioeconomic History  . Marital status: Married    Spouse name: Not on file  . Number of children: Not on file  . Years of education: Not on file  . Highest education level: Not on file  Occupational History  . Occupation: unemployed  Tobacco Use  . Smoking status: Former Smoker    Packs/day:  1.00    Years: 10.00    Pack years: 10.00    Types: Cigarettes    Quit date: 07/29/1984    Years since quitting: 35.7  . Smokeless tobacco: Never Used  Vaping Use  . Vaping Use: Never used  Substance and Sexual Activity  . Alcohol use: Yes    Comment: occassionally  . Drug use: No  . Sexual activity: Yes  Other Topics Concern  . Not on file  Social History Narrative   Married for 24 years    Six children- all live around here. Two daughters live with her   Six grand children    On Disability for mental issues   No pets   Likes to go to movies and out to dinner. She likes to do puzzle books. Likes to read      She eats a healthy diet   She does not exercise          Social Determinants of Health   Financial Resource Strain:   . Difficulty of Paying Living Expenses: Not on file  Food Insecurity:   . Worried About Running Out of Food in the Last Year: Not on file  . Ran Out of Food in the Last Year: Not on file  Transportation Needs:   .    Subjective:    Patient ID: Alexandra [name], female    DOB: 04/24/1961, 59 y.o.   MRN: [id number]  HPI 59 year old female who  has a past medical history of Diabetes mellitus (HCC), Fatty liver, GERD (gastroesophageal reflux disease), Hiatal hernia (1998), Hyperlipidemia, Hyperplastic colon polyp, Hypertension, Hyperthyroidism, Hypothyroidism, Rotator cuff tear, right, and Sleep apnea.  He presents to the office today for 3-month follow-up regarding diabetes mellitus.  She is currently maintained on glipizide 10 mg twice daily, Metformin 1000 mg twice daily, and Trulicity 1.5 mg weekly.  She has been monitoring her blood sugars at home and reports readings of 80-150 with a few higher readings depending on what she eats.  She has had  symptoms of hypoglycemia over the last 3 months about 5 times.   She is trying to stay active but not exercising and continues to work on diet  Wt Readings from Last 3 Encounters:  05/04/20 178 lb 1.6 oz (80.8 kg)  01/28/20 180 lb (81.6 kg)  10/27/19 177 lb (80.3 kg)    She is interested in CGM so that she does not need to stick her fingers multiple times a day   Her last A1c in July 2021 was 6.9   Review of Systems See HPI   Past Medical History:  Diagnosis Date  . Diabetes mellitus (HCC)   . Fatty liver   . GERD (gastroesophageal reflux disease)    prn med.  . Hiatal hernia 1998  . Hyperlipidemia   . Hyperplastic colon polyp   . Hypertension    new dx. - will start med. 06/28/2011  . Hyperthyroidism   . Hypothyroidism   . Rotator cuff tear, right   . Sleep apnea    prior to gastric bypass - no sleep study since bypass    Social History   Socioeconomic History  . Marital status: Married    Spouse name: Not on file  . Number of children: Not on file  . Years of education: Not on file  . Highest education level: Not on file  Occupational History  . Occupation: unemployed  Tobacco Use  . Smoking status: Former Smoker    Packs/day:  1.00    Years: 10.00    Pack years: 10.00    Types: Cigarettes    Quit date: 07/29/1984    Years since quitting: 35.7  . Smokeless tobacco: Never Used  Vaping Use  . Vaping Use: Never used  Substance and Sexual Activity  . Alcohol use: Yes    Comment: occassionally  . Drug use: No  . Sexual activity: Yes  Other Topics Concern  . Not on file  Social History Narrative   Married for 24 years    Six children- all live around here. Two daughters live with her   Six grand children    On Disability for mental issues   No pets   Likes to go to movies and out to dinner. She likes to do puzzle books. Likes to read      She eats a healthy diet   She does not exercise          Social Determinants of Health   Financial Resource Strain:   . Difficulty of Paying Living Expenses: Not on file  Food Insecurity:   . Worried About Running Out of Food in the Last Year: Not on file  . Ran Out of Food in the Last Year: Not on file  Transportation Needs:   .

## 2020-05-04 NOTE — Addendum Note (Signed)
Addended by: Agnes Lawrence on: 05/04/2020 09:01 AM   Modules accepted: Orders

## 2020-05-08 ENCOUNTER — Other Ambulatory Visit: Payer: Self-pay

## 2020-05-08 DIAGNOSIS — E039 Hypothyroidism, unspecified: Secondary | ICD-10-CM

## 2020-05-08 MED ORDER — LEVOTHYROXINE SODIUM 112 MCG PO TABS: 112.0000 ug | ORAL_TABLET | Freq: Every day | ORAL | 2 refills | Status: DC

## 2020-05-08 MED ORDER — OXYBUTYNIN CHLORIDE ER 5 MG PO TB24: ORAL_TABLET | ORAL | 0 refills | Status: DC

## 2020-05-24 ENCOUNTER — Other Ambulatory Visit: Payer: Self-pay | Admitting: Adult Health

## 2020-06-24 ENCOUNTER — Other Ambulatory Visit: Payer: Self-pay | Admitting: Adult Health

## 2020-07-05 ENCOUNTER — Other Ambulatory Visit: Payer: Self-pay | Admitting: Adult Health

## 2020-07-12 ENCOUNTER — Encounter: Payer: Self-pay | Admitting: Adult Health

## 2020-07-12 ENCOUNTER — Other Ambulatory Visit: Payer: Self-pay | Admitting: Adult Health

## 2020-07-12 DIAGNOSIS — E7800 Pure hypercholesterolemia, unspecified: Secondary | ICD-10-CM

## 2020-07-12 DIAGNOSIS — E1140 Type 2 diabetes mellitus with diabetic neuropathy, unspecified: Secondary | ICD-10-CM

## 2020-07-12 MED ORDER — FREESTYLE LIBRE 14 DAY SENSOR MISC: 0 refills | Status: DC

## 2020-08-04 ENCOUNTER — Encounter: Payer: Self-pay | Admitting: Adult Health

## 2020-08-04 ENCOUNTER — Ambulatory Visit (INDEPENDENT_AMBULATORY_CARE_PROVIDER_SITE_OTHER): Payer: BC Managed Care – PPO | Admitting: Adult Health

## 2020-08-04 ENCOUNTER — Other Ambulatory Visit: Payer: Self-pay

## 2020-08-04 VITALS — BP 110/60 | Temp 97.8°F | Wt 176.0 lb

## 2020-08-04 DIAGNOSIS — I10 Essential (primary) hypertension: Secondary | ICD-10-CM

## 2020-08-04 DIAGNOSIS — E1140 Type 2 diabetes mellitus with diabetic neuropathy, unspecified: Secondary | ICD-10-CM

## 2020-08-04 LAB — POCT GLYCOSYLATED HEMOGLOBIN (HGB A1C): HbA1c, POC (controlled diabetic range): 6.7 % (ref 0.0–7.0)

## 2020-08-04 NOTE — Progress Notes (Signed)
Subjective:    Patient ID: Alexandra [name], female    DOB: 03/31/1961, 60 y.o.   MRN: [id number]  HPI 60 year old female who  has a past medical history of Diabetes mellitus ([place]), Fatty liver, GERD (gastroesophageal reflux disease), Hiatal hernia (1998), Hyperlipidemia, Hyperplastic colon polyp, Hypertension, Hyperthyroidism, Hypothyroidism, Rotator cuff tear, right, and Sleep apnea.  She presents to the office today for 22-monthfollow-up regarding diabetes mellitus and hypertension.  She is currently maintained on glipizide 10 mg twice daily, metformin 1000 mg twice daily, and Trulicity 1.5 mg weekly.  She has been monitoring her blood sugars at home with the [employer] and per her information she is staying goal about 57% of the time in the last 90 days. She has had about 12 hypoglycemic episodes with most being around 4 am. She has had very few hyperglycemic events   Lab Results  Component Value Date   HGBA1C 6.7 (A) 05/04/2020   During her last visit in October 2021 she was prescribed a libre system, she was interested in using this so she would not have to prick her fingers.  Blood pressure is controlled with Ziac 10-6.25 mg.  She denies episodes of dizziness, lightheadedness, chest pain, or shortness of breath. BP Readings from Last 3 Encounters:  08/04/20 110/60  05/04/20 100/70  01/28/20 104/70   Wt Readings from Last 3 Encounters:  08/04/20 176 lb (79.8 kg)  05/04/20 178 lb 1.6 oz (80.8 kg)  01/28/20 180 lb (81.6 kg)    Review of Systems See HPI   Past Medical History:  Diagnosis Date  . Diabetes mellitus ([place]   . Fatty liver   . GERD (gastroesophageal reflux disease)    prn med.  . Hiatal hernia 1998  . Hyperlipidemia   . Hyperplastic colon polyp   . Hypertension    new dx. - will start med. 06/28/2011  . Hyperthyroidism   . Hypothyroidism   . Rotator cuff tear, right   . Sleep apnea    prior to gastric bypass - no sleep study since bypass     Social History   Socioeconomic History  . Marital status: Married    Spouse name: Not on file  . Number of children: Not on file  . Years of education: Not on file  . Highest education level: Not on file  Occupational History  . Occupation: unemployed  Tobacco Use  . Smoking status: Former Smoker    Packs/day: 1.00    Years: 10.00    Pack years: 10.00    Types: Cigarettes    Quit date: 07/29/1984    Years since quitting: 36.0  . Smokeless tobacco: Never Used  Vaping Use  . Vaping Use: Never used  Substance and Sexual Activity  . Alcohol use: Yes    Comment: occassionally  . Drug use: No  . Sexual activity: Yes  Other Topics Concern  . Not on file  Social History Narrative   Married for 24 years    Six children- all live around here. Two daughters live with her   Six grand children    On Disability for mental issues   No pets   Likes to go to movies and out to dinner. She likes to do puzzle books. Likes to read      She eats a healthy diet   She does not exercise          Social Determinants of Health   Financial Resource Strain: Not on  file  Food Insecurity: Not on file  Transportation Needs: Not on file  Physical Activity: Not on file  Stress: Not on file  Social Connections: Not on file  Intimate Partner Violence: Not on file    Past Surgical History:  Procedure Laterality Date  . ABDOMINAL HYSTERECTOMY  06/25/2000   left salpingo-oophorectomy  . BACK SURGERY  1997   discetomy, lumbar spinal fusion  . CARDIAC CATHETERIZATION  04/01/2006  . CESAREAN SECTION  1991  . CHOLECYSTECTOMY  1989  . EXPLORATORY LAPAROTOMY  05/08/2000   right salpingo-oophorectomy  . LAMINOTOMY / EXCISION DISK POSTERIOR CERVICAL SPINE  08/10/2009   C7-T1  . 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 SHOULDER ARTHROSCOPY  01/02/2006; 05/31/2004   left 2007; right 2005  . SHOULDER ARTHROSCOPY  07/02/2011   Procedure: ARTHROSCOPY SHOULDER;  Surgeon: Cammie Sickle., MD;   Location: [place];  Service: Orthopedics;  Laterality: Right;  repair supraspinatus, Debride glenohumeral joint, labrum  . SHOULDER ARTHROSCOPY DISTAL CLAVICLE EXCISION AND OPEN ROTATOR CUFF REPAIR  07/26/2010   right  . THYROID SURGERY  early 2000s   goiter  . TUBAL LIGATION      Family History  Problem Relation Age of Onset  . Coronary artery disease Other   . Cancer Paternal Grandfather        Prostate Cancer  . Diabetes Mother   . Hyperlipidemia Mother   . Hypertension Mother   . Thyroid disease Mother   . Rheum arthritis Mother   . COPD Mother     Allergies  Allergen Reactions  . Shrimp Flavor Swelling    Swelling of tongue and rash  . Ace Inhibitors Cough    Current Outpatient Medications on File Prior to Visit  Medication Sig Dispense Refill  . bisoprolol-hydrochlorothiazide (ZIAC) 10-6.25 MG tablet TAKE 1 TABLET DAILY 90 tablet 1  . Blood Glucose Monitoring Suppl (ONETOUCH VERIO) w/Device KIT 1 Units by Does not apply route daily. 1 kit 0  . citalopram (CELEXA) 40 MG tablet Take 1 tablet (40 mg total) by mouth daily. Reported on 09/19/2015 90 tablet 1  . Continuous Blood Gluc Sensor (FREESTYLE LIBRE 14 DAY SENSOR) MISC Use with libre app. 6 each 0  . fluticasone (FLONASE) 50 MCG/ACT nasal spray USE 2 SPRAYS IN EACH NOSTRIL DAILY 48 g 3  . gabapentin (NEURONTIN) 400 MG capsule TAKE 2 CAPSULES THREE TIMES A DAY 540 capsule 1  . glipiZIDE (GLUCOTROL) 10 MG tablet TAKE 1 TABLET TWICE A DAY BEFORE MEALS 180 tablet 3  . ibuprofen (ADVIL,MOTRIN) 600 MG tablet Take 1 tablet (600 mg total) by mouth every 8 (eight) hours as needed. 20 tablet 0  . levothyroxine (SYNTHROID) 112 MCG tablet Take 1 tablet (112 mcg total) by mouth daily. 90 tablet 2  . lidocaine (XYLOCAINE) 5 % ointment Apply 1 application topically 4 (four) times daily as needed. On bottom of feet B/L 35.44 g 0  . metFORMIN (GLUCOPHAGE) 1000 MG tablet TAKE 1 TABLET DAILY WITH BREAKFAST 90 tablet 3   . nitroGLYCERIN (NITROSTAT) 0.4 MG SL tablet Place 1 tablet (0.4 mg total) under the tongue every 5 (five) minutes as needed for chest pain (CP or SOB). 60 tablet 12  . ONE TOUCH LANCETS MISC Test blood sugars once daily Dx: E11.9 100 each 3  . ONETOUCH VERIO test strip USE TO TEST BLOOD GLUCOSE THREE TIMES A DAY 300 strip 3  . oxybutynin (DITROPAN-XL) 5 MG 24 hr tablet TAKE 1 TABLET DAILY AT  BEDTIME 90 tablet 3  . risperiDONE (RISPERDAL) 1 MG tablet Take 1 mg by mouth 2 (two) times daily.  2  . simvastatin (ZOCOR) 20 MG tablet TAKE 1 TABLET AT BEDTIME 90 tablet 3  . traZODone (DESYREL) 100 MG tablet Take 1 tablet (100 mg total) by mouth at bedtime. Reported on 09/19/2015 90 tablet 1  . TRULICITY 1.5 EQ/6.8TM SOPN INJECT 1.5 MG UNDER THE SKIN ONCE A WEEK 6 mL 3   No current facility-administered medications on file prior to visit.    BP 110/60   Temp 97.8 F (36.6 C)   Wt 176 lb (79.8 kg)   BMI 32.19 kg/m       Objective:   Physical Exam Vitals and nursing note reviewed.  Constitutional:      Appearance: Normal appearance.  Cardiovascular:     Rate and Rhythm: Normal rate and regular rhythm.     Pulses: Normal pulses.     Heart sounds: Normal heart sounds.  Pulmonary:     Effort: Pulmonary effort is normal.     Breath sounds: Normal breath sounds.  Skin:    General: Skin is warm and dry.     Capillary Refill: Capillary refill takes less than 2 seconds.  Neurological:     General: No focal deficit present.     Mental Status: She is alert and oriented to person, place, and time.  Psychiatric:        Mood and Affect: Mood normal.        Behavior: Behavior normal.        Thought Content: Thought content normal.        Judgment: Judgment normal.       Assessment & Plan:  1. Type 2 diabetes mellitus with diabetic neuropathy, without long-term current use of insulin (HCC)  - POC HgB A1c- 6.7  - Will have her d/c evening glipizide dose due to low blow sugars in the early  morning  - Work on low carb/low sugar diet   2. Essential hypertension - well controlled. No change in medications    Dorothyann Peng, NP

## 2020-08-04 NOTE — Patient Instructions (Signed)
Dont take the evening dose of Glipizide   Your A1c stayed the same at 6.7   Work on low carb and low sugar diet which will help

## 2020-08-05 ENCOUNTER — Telehealth: Payer: BC Managed Care – PPO | Admitting: Orthopedic Surgery

## 2020-08-05 ENCOUNTER — Encounter: Payer: Self-pay | Admitting: Adult Health

## 2020-08-05 DIAGNOSIS — U071 COVID-19: Secondary | ICD-10-CM

## 2020-08-05 MED ORDER — BENZONATATE 100 MG PO CAPS: 100.0000 mg | ORAL_CAPSULE | Freq: Two times a day (BID) | ORAL | 0 refills | Status: DC | PRN

## 2020-08-05 MED ORDER — NAPROXEN 500 MG PO TABS: 500.0000 mg | ORAL_TABLET | Freq: Two times a day (BID) | ORAL | 0 refills | Status: DC

## 2020-08-05 MED ORDER — LOPERAMIDE HCL 2 MG PO TABS: 2.0000 mg | ORAL_TABLET | Freq: Four times a day (QID) | ORAL | 0 refills | Status: DC | PRN

## 2020-08-05 NOTE — Progress Notes (Signed)
E-Visit for Corona Virus Screening  We are sorry you are not feeling well. We are here to help!  You have tested positive for COVID-19, meaning that you were infected with the novel coronavirus and could give the virus to others.  It is vitally important that you stay home so you do not spread it to others.      Please continue isolation at home, for at least 10 days since the start of your symptoms and until you have had 24 hours with no fever (without taking a fever reducer) and with improving of symptoms.  If you have no symptoms but tested positive (or all symptoms resolve after 5 days and you have no fever) you can leave your house but continue to wear a mask around others for an additional 5 days. If you have a fever,continue to stay home until you have had 24 hours of no fever. Most cases improve 5-10 days from onset but we have seen a small number of patients who have gotten worse after the 10 days.  Please be sure to watch for worsening symptoms and remain taking the proper precautions.   Go to the nearest hospital ED for assessment if fever/cough/breathlessness are severe or illness seems like a threat to life.    The following symptoms may appear 2-14 days after exposure: . Fever . Cough . Shortness of breath or difficulty breathing . Chills . Repeated shaking with chills . Muscle pain . Headache . Sore throat . New loss of taste or smell . Fatigue . Congestion or runny nose . Nausea or vomiting . Diarrhea  You have been enrolled in [facility] for COVID-19. Daily you will receive a questionnaire within the [facility] website. Our COVID-19 response team will be monitoring your responses daily.  You can use medication such as A prescription cough medication called Tessalon Perles 100 mg. You may take 1-2 capsules every 8 hours as needed for cough and A prescription anti-inflammatory called Naprosyn 500 mg. Take twice daily as needed for fever or body aches for 2 weeks.  I have also called in Loperamide 2mg  to take after each loose stool.  We have asked the monoclonal antibody team contact you to discuss the infusion with you and potentially set this up. You should hear from them in the next 48 hours.    You may also take acetaminophen (Tylenol) as needed for fever.  HOME CARE: . Only take medications as instructed by your medical team. . Drink plenty of fluids and get plenty of rest. . A steam or ultrasonic humidifier can help if you have congestion.   GET HELP RIGHT AWAY IF YOU HAVE EMERGENCY WARNING SIGNS.  Call 911 or proceed to your closest emergency facility if: . You develop worsening high fever. . Trouble breathing . Bluish lips or face . Persistent pain or pressure in the chest . New confusion . Inability to wake or stay awake . You cough up blood. . Your symptoms become more severe . Inability to hold down food or fluids  This list is not all possible symptoms. Contact your medical provider for any symptoms that are severe or concerning to you.    Your e-visit answers were reviewed by a board certified advanced clinical practitioner to complete your personal care plan.  Depending on the condition, your plan could have included both over the counter or prescription medications.  If there is a problem please reply once you have received a response from your provider.  Your safety is important to [place].  If you have drug allergies check your prescription carefully.    You can use MyChart to ask questions about today's visit, request a non-urgent call back, or ask for a work or school excuse for 24 hours related to this e-Visit. If it has been greater than 24 hours you will need to follow up with your provider, or enter a new e-Visit to address those concerns. You will get an e-mail in the next two days asking about your experience.  I hope that your e-visit has been valuable and will speed your recovery. Thank you for using e-visits.   Greater  than 5 minutes, yet less than 10 minutes of time have been spent researching, coordinating and implementing care for this patient today.

## 2020-08-08 NOTE — Telephone Encounter (Signed)
Noted.  Verbally informed Cory.  Nothing further needed.

## 2020-08-24 ENCOUNTER — Encounter: Payer: Self-pay | Admitting: Adult Health

## 2020-09-18 ENCOUNTER — Other Ambulatory Visit: Payer: Self-pay | Admitting: Adult Health

## 2020-09-18 DIAGNOSIS — E1140 Type 2 diabetes mellitus with diabetic neuropathy, unspecified: Secondary | ICD-10-CM

## 2020-09-20 ENCOUNTER — Encounter: Payer: Self-pay | Admitting: Adult Health

## 2020-09-20 ENCOUNTER — Other Ambulatory Visit: Payer: Self-pay | Admitting: Adult Health

## 2020-09-20 NOTE — Telephone Encounter (Signed)
Sent to the pharmacy by e-scribe. 

## 2020-09-21 ENCOUNTER — Other Ambulatory Visit: Payer: Self-pay | Admitting: Adult Health

## 2020-09-21 MED ORDER — TRULICITY 3 MG/0.5ML ~~LOC~~ SOAJ: 3.0000 mg | SUBCUTANEOUS | 0 refills | Status: DC

## 2020-09-21 MED ORDER — GLIPIZIDE ER 10 MG PO TB24: 10.0000 mg | ORAL_TABLET | Freq: Every day | ORAL | 0 refills | Status: DC

## 2020-10-17 ENCOUNTER — Telehealth: Payer: Self-pay | Admitting: *Deleted

## 2020-10-17 NOTE — Telephone Encounter (Signed)
[facility] faxed a refill request for Naproxen 500mg .  Request sent to PCP as last Rx was given by a different provider.

## 2020-10-23 ENCOUNTER — Encounter: Payer: Self-pay | Admitting: Adult Health

## 2020-10-24 ENCOUNTER — Encounter: Payer: Self-pay | Admitting: Adult Health

## 2020-10-24 ENCOUNTER — Ambulatory Visit (INDEPENDENT_AMBULATORY_CARE_PROVIDER_SITE_OTHER): Payer: BC Managed Care – PPO | Admitting: Adult Health

## 2020-10-24 ENCOUNTER — Other Ambulatory Visit: Payer: Self-pay

## 2020-10-24 VITALS — BP 136/92 | HR 118 | Temp 98.3°F | Wt 182.6 lb

## 2020-10-24 DIAGNOSIS — E1140 Type 2 diabetes mellitus with diabetic neuropathy, unspecified: Secondary | ICD-10-CM

## 2020-10-24 DIAGNOSIS — J020 Streptococcal pharyngitis: Secondary | ICD-10-CM

## 2020-10-24 LAB — POCT GLYCOSYLATED HEMOGLOBIN (HGB A1C): Hemoglobin A1C: 7.4 % — AB (ref 4.0–5.6)

## 2020-10-24 LAB — POCT RAPID STREP A

## 2020-10-24 MED ORDER — AMOXICILLIN 500 MG PO CAPS: 500.0000 mg | ORAL_CAPSULE | Freq: Two times a day (BID) | ORAL | 0 refills | Status: AC

## 2020-10-24 MED ORDER — MAGIC MOUTHWASH W/LIDOCAINE: 5.0000 mL | Freq: Three times a day (TID) | ORAL | 0 refills | Status: DC | PRN

## 2020-10-24 MED ORDER — GLIPIZIDE ER 5 MG PO TB24: 5.0000 mg | ORAL_TABLET | Freq: Every evening | ORAL | 0 refills | Status: DC

## 2020-10-24 NOTE — Progress Notes (Signed)
Subjective:    Patient ID: Alexandra [name], female    DOB: [birth year], 60 y.o.   MRN: [id number]  HPI 60 year old female who  has a past medical history of Diabetes mellitus ([place]), Fatty liver, GERD (gastroesophageal reflux disease), Hiatal hernia (1998), Hyperlipidemia, Hyperplastic colon polyp, Hypertension, Hyperthyroidism, Hypothyroidism, Rotator cuff tear, right, and Sleep apnea.  She presents to the office today with the complaint of sore throat. She reports having a sore throat x 3 -4 days. Symptoms include painful swallowing, talking, and dry cough. For the first two days she had a low grade fever up to 100. This has since resolved.   She has been using Mucinex, Delsym, Nyquil/Dayquil as we as Tessalon Pearls.   She had a negative covid test 2 days ago.    She also reports that over the last month or so she has been experiencing elevated glucose readings. Most of her readings have been in the high 100's with some readings in the 200-300 range. She will have some dips where she has episodes of hypoglycemia. She is currently maintained on Metformin 1000 mg daily, glipizide 10 mg daily, and Trulicity 3 mg daily. During her last visit in January we d/c Glipizide 10 mg XR in the evening due to hypoglycemia events.   Lab Results  Component Value Date   HGBA1C 6.7 08/04/2020    Review of Systems See HPI   Past Medical History:  Diagnosis Date  . Diabetes mellitus ([place])   . Fatty liver   . GERD (gastroesophageal reflux disease)    prn med.  . Hiatal hernia 1998  . Hyperlipidemia   . Hyperplastic colon polyp   . Hypertension    new dx. - will start med. 06/28/2011  . Hyperthyroidism   . Hypothyroidism   . Rotator cuff tear, right   . Sleep apnea    prior to gastric bypass - no sleep study since bypass    Social History   Socioeconomic History  . Marital status: Married    Spouse name: Not on file  . Number of children: Not on file  . Years of education: Not on  file  . Highest education level: Not on file  Occupational History  . Occupation: unemployed  Tobacco Use  . Smoking status: Former Smoker    Packs/day: 1.00    Years: 10.00    Pack years: 10.00    Types: Cigarettes    Quit date: 07/29/1984    Years since quitting: 36.2  . Smokeless tobacco: Never Used  Vaping Use  . Vaping Use: Never used  Substance and Sexual Activity  . Alcohol use: Yes    Comment: occassionally  . Drug use: No  . Sexual activity: Yes  Other Topics Concern  . Not on file  Social History Narrative   Married for 24 years    Six children- all live around here. Two daughters live with her   Six grand children    On Disability for mental issues   No pets   Likes to go to movies and out to dinner. She likes to do puzzle books. Likes to read      She eats a healthy diet   She does not exercise          Social Determinants of Health   Financial Resource Strain: Not on file  Food Insecurity: Not on file  Transportation Needs: Not on file  Physical Activity: Not on file  Stress: Not on file  Social Connections: Not on file  Intimate Partner Violence: Not on file    Past Surgical History:  Procedure Laterality Date  . ABDOMINAL HYSTERECTOMY  06/25/2000   left salpingo-oophorectomy  . BACK SURGERY  1997   discetomy, lumbar spinal fusion  . CARDIAC CATHETERIZATION  04/01/2006  . CESAREAN SECTION  1991  . CHOLECYSTECTOMY  1989  . EXPLORATORY LAPAROTOMY  05/08/2000   right salpingo-oophorectomy  . LAMINOTOMY / EXCISION DISK POSTERIOR CERVICAL SPINE  08/10/2009   C7-T1  . 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 SHOULDER ARTHROSCOPY  01/02/2006; 05/31/2004   left 2007; right 2005  . SHOULDER ARTHROSCOPY  07/02/2011   Procedure: ARTHROSCOPY SHOULDER;  Surgeon: Cammie Sickle., MD;  Location: [facility];  Service: Orthopedics;  Laterality: Right;  repair supraspinatus, Debride glenohumeral joint, labrum  . SHOULDER ARTHROSCOPY DISTAL CLAVICLE  EXCISION AND OPEN ROTATOR CUFF REPAIR  07/26/2010   right  . THYROID SURGERY  early 2000s   goiter  . TUBAL LIGATION      Family History  Problem Relation Age of Onset  . Coronary artery disease Other   . Cancer Paternal Grandfather        Prostate Cancer  . Diabetes Mother   . Hyperlipidemia Mother   . Hypertension Mother   . Thyroid disease Mother   . Rheum arthritis Mother   . COPD Mother     Allergies  Allergen Reactions  . Shrimp Flavor Swelling    Swelling of tongue and rash  . Ace Inhibitors Cough    Current Outpatient Medications on File Prior to Visit  Medication Sig Dispense Refill  . benzonatate (TESSALON) 100 MG capsule Take 1 capsule (100 mg total) by mouth 2 (two) times daily as needed for cough. 20 capsule 0  . bisoprolol-hydrochlorothiazide (ZIAC) 10-6.25 MG tablet TAKE 1 TABLET DAILY 90 tablet 0  . Blood Glucose Monitoring Suppl (ONETOUCH VERIO) w/Device KIT 1 Units by Does not apply route daily. 1 kit 0  . citalopram (CELEXA) 40 MG tablet Take 1 tablet (40 mg total) by mouth daily. Reported on 09/19/2015 90 tablet 1  . Continuous Blood Gluc Sensor (FREESTYLE LIBRE 14 DAY SENSOR) MISC USE WITH LIBRE APP 6 each 3  . Dulaglutide (TRULICITY) 3 ZO/1.0RU SOPN Inject 3 mg as directed once a week. 6 mL 0  . fluticasone (FLONASE) 50 MCG/ACT nasal spray USE 2 SPRAYS IN EACH NOSTRIL DAILY 48 g 3  . gabapentin (NEURONTIN) 400 MG capsule TAKE 2 CAPSULES THREE TIMES A DAY 540 capsule 1  . glipiZIDE (GLUCOTROL XL) 10 MG 24 hr tablet Take 1 tablet (10 mg total) by mouth daily with breakfast. 90 tablet 0  . ibuprofen (ADVIL,MOTRIN) 600 MG tablet Take 1 tablet (600 mg total) by mouth every 8 (eight) hours as needed. 20 tablet 0  . levothyroxine (SYNTHROID) 112 MCG tablet Take 1 tablet (112 mcg total) by mouth daily. 90 tablet 2  . lidocaine (XYLOCAINE) 5 % ointment Apply 1 application topically 4 (four) times daily as needed. On bottom of feet B/L 35.44 g 0  . loperamide  (IMODIUM A-D) 2 MG tablet Take 1 tablet (2 mg total) by mouth 4 (four) times daily as needed for diarrhea or loose stools. 30 tablet 0  . metFORMIN (GLUCOPHAGE) 1000 MG tablet TAKE 1 TABLET DAILY WITH BREAKFAST 90 tablet 3  . naproxen (NAPROSYN) 500 MG tablet Take 1 tablet (500 mg total) by mouth 2 (two) times daily with a meal. 60 tablet 0  .  nitroGLYCERIN (NITROSTAT) 0.4 MG SL tablet Place 1 tablet (0.4 mg total) under the tongue every 5 (five) minutes as needed for chest pain (CP or SOB). 60 tablet 12  . ONE TOUCH LANCETS MISC Test blood sugars once daily Dx: E11.9 100 each 3  . ONETOUCH VERIO test strip USE TO TEST BLOOD GLUCOSE THREE TIMES A DAY 300 strip 3  . oxybutynin (DITROPAN-XL) 5 MG 24 hr tablet TAKE 1 TABLET DAILY AT BEDTIME 90 tablet 3  . risperiDONE (RISPERDAL) 1 MG tablet Take 1 mg by mouth 2 (two) times daily.  2  . simvastatin (ZOCOR) 20 MG tablet TAKE 1 TABLET AT BEDTIME 90 tablet 3  . traZODone (DESYREL) 100 MG tablet Take 1 tablet (100 mg total) by mouth at bedtime. Reported on 09/19/2015 90 tablet 1   No current facility-administered medications on file prior to visit.    BP (!) 136/92 (BP Location: Left Arm, Patient Position: Sitting, Cuff Size: Normal)   Pulse (!) 118   Temp 98.3 F (36.8 C) (Oral)   Wt 182 lb 9.6 oz (82.8 kg)   SpO2 99%   BMI 33.40 kg/m       Objective:   Physical Exam Vitals and nursing note reviewed.  Constitutional:      Appearance: She is well-developed.  HENT:     Nose: Nose normal. No congestion or rhinorrhea.     Mouth/Throat:     Pharynx: Pharyngeal swelling and posterior oropharyngeal erythema present. No oropharyngeal exudate or uvula swelling.  Neck:     Thyroid: No thyromegaly.  Cardiovascular:     Rate and Rhythm: Tachycardia present.     Heart sounds: Normal heart sounds.  Pulmonary:     Effort: Pulmonary effort is normal.     Breath sounds: Normal breath sounds.  Musculoskeletal:     Cervical back: Normal range of  motion and neck supple.  Lymphadenopathy:     Cervical: No cervical adenopathy.  Skin:    General: Skin is warm and dry.     Capillary Refill: Capillary refill takes less than 2 seconds.  Neurological:     General: No focal deficit present.     Mental Status: She is alert and oriented to person, place, and time.  Psychiatric:        Mood and Affect: Mood normal.        Behavior: Behavior normal.        Judgment: Judgment normal.       Assessment & Plan:  1. Strep throat  - amoxicillin (AMOXIL) 500 MG capsule; Take 1 capsule (500 mg total) by mouth 2 (two) times daily for 10 days.  Dispense: 20 capsule; Refill: 0 - magic mouthwash w/lidocaine SOLN; Take 5 mLs by mouth 3 (three) times daily as needed.  Dispense: 180 mL; Refill: 0 - POCT Rapid Strep A- Positive  - Follow up if no improvement in the next 2-3 days   2. Type 2 diabetes mellitus with diabetic neuropathy, without long-term current use of insulin (HCC)  - POCT glycosylated hemoglobin (Hb A1C)- 7.4 - has increased and no longer at goal.  - Will add back Glipizide but at reduced dose in the evening  - Follow up in 3 months or sooner if needed - glipiZIDE (GLUCOTROL XL) 5 MG 24 hr tablet; Take 1 tablet (5 mg total) by mouth every evening.  Dispense: 90 tablet; Refill: 0   Dorothyann Peng, NP

## 2020-11-02 ENCOUNTER — Ambulatory Visit: Payer: BC Managed Care – PPO | Admitting: Adult Health

## 2021-01-07 ENCOUNTER — Other Ambulatory Visit: Payer: Self-pay | Admitting: Adult Health

## 2021-01-07 DIAGNOSIS — E039 Hypothyroidism, unspecified: Secondary | ICD-10-CM

## 2021-01-15 ENCOUNTER — Other Ambulatory Visit: Payer: Self-pay | Admitting: Adult Health

## 2021-01-15 DIAGNOSIS — E1140 Type 2 diabetes mellitus with diabetic neuropathy, unspecified: Secondary | ICD-10-CM

## 2021-01-23 LAB — HM DIABETES EYE EXAM

## 2021-01-26 ENCOUNTER — Encounter: Payer: Self-pay | Admitting: Adult Health

## 2021-02-08 ENCOUNTER — Other Ambulatory Visit: Payer: Self-pay

## 2021-02-09 ENCOUNTER — Ambulatory Visit: Payer: BC Managed Care – PPO | Admitting: Adult Health

## 2021-02-09 NOTE — Progress Notes (Deleted)
Subjective:    Patient ID: Alexandra [name], female    DOB: [birth year], 60 y.o.   MRN: [id number]  HPI 60 year old female who  has a past medical history of Diabetes mellitus ([place]), Fatty liver, GERD (gastroesophageal reflux disease), Hiatal hernia (1998), Hyperlipidemia, Hyperplastic colon polyp, Hypertension, Hyperthyroidism, Hypothyroidism, Rotator cuff tear, right, and Sleep apnea.  She presents to the office today for follow-up regarding diabetes and hypertension  DM -she is currently maintained on glipizide 10 mg in the morning and 5 mg in the evening,  metformin 1000 mg twice daily, and Trulicity 3 mg weekly.  She does monitor her blood sugars at home with the [facility] system  And during her last visit in March 2022 her A1c had increased to 7.4 from 6.7.  At this time glipizide will was added at 5 mg extended release in the evening.  Lab Results  Component Value Date   HGBA1C 7.4 (A) 10/24/2020   Hypertension -controlled with Ziac 10-6.25 mg.  She denies episodes of dizziness, lightheadedness, chest pain, or shortness of breath BP Readings from Last 3 Encounters:  10/24/20 (!) 136/92  08/04/20 110/60  05/04/20 100/70    Review of Systems See HPI   Past Medical History:  Diagnosis Date   Diabetes mellitus ([place])    Fatty liver    GERD (gastroesophageal reflux disease)    prn med.   Hiatal hernia 1998   Hyperlipidemia    Hyperplastic colon polyp    Hypertension    new dx. - will start med. 06/28/2011   Hyperthyroidism    Hypothyroidism    Rotator cuff tear, right    Sleep apnea    prior to gastric bypass - no sleep study since bypass    Social History   Socioeconomic History   Marital status: Married    Spouse name: Not on file   Number of children: Not on file   Years of education: Not on file   Highest education level: Not on file  Occupational History   Occupation: unemployed  Tobacco Use   Smoking status: Former    Packs/day: 1.00    Years: 10.00     Pack years: 10.00    Types: Cigarettes    Quit date: 07/29/1984    Years since quitting: 36.5   Smokeless tobacco: Never  Vaping Use   Vaping Use: Never used  Substance and Sexual Activity   Alcohol use: Yes    Comment: occassionally   Drug use: No   Sexual activity: Yes  Other Topics Concern   Not on file  Social History Narrative   Married for 24 years    Six children- all live around here. Two daughters live with her   Six grand children    On Disability for mental issues   No pets   Likes to go to movies and out to dinner. She likes to do puzzle books. Likes to read      She eats a healthy diet   She does not exercise          Social Determinants of Health   Financial Resource Strain: Not on file  Food Insecurity: Not on file  Transportation Needs: Not on file  Physical Activity: Not on file  Stress: Not on file  Social Connections: Not on file  Intimate Partner Violence: Not on file    Past Surgical History:  Procedure Laterality Date   ABDOMINAL HYSTERECTOMY  06/25/2000   left salpingo-oophorectomy   BACK  SURGERY  1997   discetomy, lumbar spinal fusion   CARDIAC CATHETERIZATION  04/01/2006   CESAREAN SECTION  1991   CHOLECYSTECTOMY  1989   EXPLORATORY LAPAROTOMY  05/08/2000   right salpingo-oophorectomy   LAMINOTOMY / EXCISION DISK POSTERIOR CERVICAL SPINE  08/10/2009   C7-T1   ROUX-EN-Y GASTRIC BYPASS  05/19/2007   SHOULDER ARTHROSCOPY  01/02/2006; 05/31/2004   left 2007; right 2005   SHOULDER ARTHROSCOPY  07/02/2011   Procedure: ARTHROSCOPY SHOULDER;  Surgeon: Cammie Sickle., MD;  Location: [facility];  Service: Orthopedics;  Laterality: Right;  repair supraspinatus, Debride glenohumeral joint, labrum   SHOULDER ARTHROSCOPY DISTAL CLAVICLE EXCISION AND OPEN ROTATOR CUFF REPAIR  07/26/2010   right   THYROID SURGERY  early 2000s   goiter   TUBAL LIGATION      Family History  Problem Relation Age of Onset   Coronary artery disease  Other    Cancer Paternal Grandfather        Prostate Cancer   Diabetes Mother    Hyperlipidemia Mother    Hypertension Mother    Thyroid disease Mother    Rheum arthritis Mother    COPD Mother     Allergies  Allergen Reactions   Shrimp Flavor Swelling    Swelling of tongue and rash   Ace Inhibitors Cough    Current Outpatient Medications on File Prior to Visit  Medication Sig Dispense Refill   benzonatate (TESSALON) 100 MG capsule Take 1 capsule (100 mg total) by mouth 2 (two) times daily as needed for cough. 20 capsule 0   bisoprolol-hydrochlorothiazide (ZIAC) 10-6.25 MG tablet TAKE 1 TABLET DAILY 90 tablet 3   Blood Glucose Monitoring Suppl (ONETOUCH VERIO) w/Device KIT 1 Units by Does not apply route daily. 1 kit 0   citalopram (CELEXA) 40 MG tablet Take 1 tablet (40 mg total) by mouth daily. Reported on 09/19/2015 90 tablet 1   Continuous Blood Gluc Sensor (FREESTYLE LIBRE 14 DAY SENSOR) MISC USE WITH LIBRE APP 6 each 3   fluticasone (FLONASE) 50 MCG/ACT nasal spray USE 2 SPRAYS IN EACH NOSTRIL DAILY 48 g 3   gabapentin (NEURONTIN) 400 MG capsule TAKE 2 CAPSULES THREE TIMES A DAY 540 capsule 1   glipiZIDE (GLUCOTROL XL) 10 MG 24 hr tablet TAKE 1 TABLET DAILY WITH BREAKFAST 90 tablet 3   glipiZIDE (GLUCOTROL XL) 5 MG 24 hr tablet TAKE 1 TABLET BY MOUTH EVERY DAY IN THE EVENING 90 tablet 0   ibuprofen (ADVIL,MOTRIN) 600 MG tablet Take 1 tablet (600 mg total) by mouth every 8 (eight) hours as needed. 20 tablet 0   levothyroxine (SYNTHROID) 112 MCG tablet TAKE 1 TABLET DAILY 90 tablet 3   lidocaine (XYLOCAINE) 5 % ointment Apply 1 application topically 4 (four) times daily as needed. On bottom of feet B/L 35.44 g 0   loperamide (IMODIUM A-D) 2 MG tablet Take 1 tablet (2 mg total) by mouth 4 (four) times daily as needed for diarrhea or loose stools. 30 tablet 0   magic mouthwash w/lidocaine SOLN Take 5 mLs by mouth 3 (three) times daily as needed. 180 mL 0   metFORMIN (GLUCOPHAGE)  1000 MG tablet TAKE 1 TABLET DAILY WITH BREAKFAST 90 tablet 3   naproxen (NAPROSYN) 500 MG tablet Take 1 tablet (500 mg total) by mouth 2 (two) times daily with a meal. 60 tablet 0   nitroGLYCERIN (NITROSTAT) 0.4 MG SL tablet Place 1 tablet (0.4 mg total) under the tongue every 5 (five)  minutes as needed for chest pain (CP or SOB). 60 tablet 12   ONE TOUCH LANCETS MISC Test blood sugars once daily Dx: E11.9 100 each 3   ONETOUCH VERIO test strip USE TO TEST BLOOD GLUCOSE THREE TIMES A DAY 300 strip 3   oxybutynin (DITROPAN-XL) 5 MG 24 hr tablet TAKE 1 TABLET DAILY AT BEDTIME 90 tablet 3   risperiDONE (RISPERDAL) 1 MG tablet Take 1 mg by mouth 2 (two) times daily.  2   simvastatin (ZOCOR) 20 MG tablet TAKE 1 TABLET AT BEDTIME 90 tablet 3   traZODone (DESYREL) 100 MG tablet Take 1 tablet (100 mg total) by mouth at bedtime. Reported on 09/19/2015 90 tablet 1   TRULICITY 3 YT/0.1SW SOPN INJECT 3 MG AS DIRECTED ONCE A WEEK 6 mL 3   No current facility-administered medications on file prior to visit.    There were no vitals taken for this visit.      Objective:   Physical Exam Vitals and nursing note reviewed.  Constitutional:      Appearance: Normal appearance.  Cardiovascular:     Rate and Rhythm: Normal rate and regular rhythm.     Pulses: Normal pulses.     Heart sounds: Normal heart sounds.  Pulmonary:     Effort: Pulmonary effort is normal.     Breath sounds: Normal breath sounds.  Musculoskeletal:        General: Normal range of motion.  Skin:    General: Skin is warm and dry.  Neurological:     General: No focal deficit present.     Mental Status: She is alert and oriented to person, place, and time.  Psychiatric:        Mood and Affect: Mood normal.        Behavior: Behavior normal.        Thought Content: Thought content normal.        Judgment: Judgment normal.      Assessment & Plan:

## 2021-02-14 ENCOUNTER — Other Ambulatory Visit: Payer: Self-pay

## 2021-02-15 ENCOUNTER — Other Ambulatory Visit: Payer: Self-pay | Admitting: Adult Health

## 2021-02-15 ENCOUNTER — Encounter: Payer: Self-pay | Admitting: Adult Health

## 2021-02-15 ENCOUNTER — Ambulatory Visit (INDEPENDENT_AMBULATORY_CARE_PROVIDER_SITE_OTHER): Payer: BC Managed Care – PPO | Admitting: Adult Health

## 2021-02-15 ENCOUNTER — Encounter: Payer: Self-pay | Admitting: Gastroenterology

## 2021-02-15 VITALS — BP 120/80 | HR 88 | Temp 98.0°F | Ht 62.0 in | Wt 176.0 lb

## 2021-02-15 DIAGNOSIS — Z23 Encounter for immunization: Secondary | ICD-10-CM | POA: Diagnosis not present

## 2021-02-15 DIAGNOSIS — E1140 Type 2 diabetes mellitus with diabetic neuropathy, unspecified: Secondary | ICD-10-CM

## 2021-02-15 DIAGNOSIS — Z1231 Encounter for screening mammogram for malignant neoplasm of breast: Secondary | ICD-10-CM

## 2021-02-15 DIAGNOSIS — I10 Essential (primary) hypertension: Secondary | ICD-10-CM | POA: Diagnosis not present

## 2021-02-15 LAB — POCT GLYCOSYLATED HEMOGLOBIN (HGB A1C): Hemoglobin A1C: 6.3 % — AB (ref 4.0–5.6)

## 2021-02-15 MED ORDER — GLIPIZIDE ER 10 MG PO TB24: 10.0000 mg | ORAL_TABLET | Freq: Every day | ORAL | 0 refills | Status: DC

## 2021-02-15 NOTE — Progress Notes (Signed)
Subjective:    Patient ID: Alexandra [name], female    DOB: 03-14-1961, 60 y.o.   MRN: [id number]  HPI 60 year old female who  has a past medical history of Diabetes mellitus ([place]), Fatty liver, GERD (gastroesophageal reflux disease), Hiatal hernia (1998), Hyperlipidemia, Hyperplastic colon polyp, Hypertension, Hyperthyroidism, Hypothyroidism, Rotator cuff tear, right, and Sleep apnea.  She presents to the office today for follow-up regarding diabetes and hypertension  DM -diabetes is currently maintained with glipizide 10 mg in the morning and 5 mg in the evening,  metformin 1000 mg twice daily, and Trulicity 3 mg weekly.  She does monitor her blood sugars at home with the [facility] system. She is in goal about 60% of the time, she does spike into the 200's around mid morning after she has a carb heavy breakfast.   Lab Results  Component Value Date   HGBA1C 7.4 (A) 10/24/2020   HTN -in the past her blood pressure has been controlled with Ziac 10-6.25 mg.  She denies episodes of dizziness, lightheadedness, chest pain, or shortness of breath BP Readings from Last 3 Encounters:  02/15/21 120/80  10/24/20 (!) 136/92  08/04/20 110/60   Wt Readings from Last 3 Encounters:  02/15/21 176 lb (79.8 kg)  10/24/20 182 lb 9.6 oz (82.8 kg)  08/04/20 176 lb (79.8 kg)    Review of Systems See HPI   Past Medical History:  Diagnosis Date   Diabetes mellitus ([place])    Fatty liver    GERD (gastroesophageal reflux disease)    prn med.   Hiatal hernia 1998   Hyperlipidemia    Hyperplastic colon polyp    Hypertension    new dx. - will start med. 06/28/2011   Hyperthyroidism    Hypothyroidism    Rotator cuff tear, right    Sleep apnea    prior to gastric bypass - no sleep study since bypass    Social History   Socioeconomic History   Marital status: Married    Spouse name: Not on file   Number of children: Not on file   Years of education: Not on file   Highest education level: Not  on file  Occupational History   Occupation: unemployed  Tobacco Use   Smoking status: Former    Packs/day: 1.00    Years: 10.00    Pack years: 10.00    Types: Cigarettes    Quit date: 07/29/1984    Years since quitting: 36.5   Smokeless tobacco: Never  Vaping Use   Vaping Use: Never used  Substance and Sexual Activity   Alcohol use: Yes    Comment: occassionally   Drug use: No   Sexual activity: Yes  Other Topics Concern   Not on file  Social History Narrative   Married for 24 years    Six children- all live around here. Two daughters live with her   Six grand children    On Disability for mental issues   No pets   Likes to go to movies and out to dinner. She likes to do puzzle books. Likes to read      She eats a healthy diet   She does not exercise          Social Determinants of Radio broadcast assistant Strain: Not on file  Food Insecurity: Not on file  Transportation Needs: Not on file  Physical Activity: Not on file  Stress: Not on file  Social Connections: Not on file  Intimate Partner Violence: Not on file    Past Surgical History:  Procedure Laterality Date   ABDOMINAL HYSTERECTOMY  06/25/2000   left salpingo-oophorectomy   BACK SURGERY  1997   discetomy, lumbar spinal fusion   CARDIAC CATHETERIZATION  04/01/2006   [place]   EXPLORATORY LAPAROTOMY  05/08/2000   right salpingo-oophorectomy   LAMINOTOMY / EXCISION DISK POSTERIOR CERVICAL SPINE  08/10/2009   C7-T1   ROUX-EN-Y GASTRIC BYPASS  05/19/2007   SHOULDER ARTHROSCOPY  01/02/2006; 05/31/2004   left 2007; right 2005   SHOULDER ARTHROSCOPY  07/02/2011   Procedure: ARTHROSCOPY SHOULDER;  Surgeon: Cammie Sickle., MD;  Location: [facility];  Service: Orthopedics;  Laterality: Right;  repair supraspinatus, Debride glenohumeral joint, labrum   SHOULDER ARTHROSCOPY DISTAL CLAVICLE EXCISION AND OPEN ROTATOR CUFF REPAIR  07/26/2010   right    THYROID SURGERY  early 2000s   goiter   TUBAL LIGATION      Family History  Problem Relation Age of Onset   Coronary artery disease Other    Cancer Paternal Grandfather        Prostate Cancer   Diabetes Mother    Hyperlipidemia Mother    Hypertension Mother    Thyroid disease Mother    Rheum arthritis Mother    COPD Mother     Allergies  Allergen Reactions   Shrimp Flavor Swelling    Swelling of tongue and rash   Ace Inhibitors Cough    Current Outpatient Medications on File Prior to Visit  Medication Sig Dispense Refill   benzonatate (TESSALON) 100 MG capsule Take 1 capsule (100 mg total) by mouth 2 (two) times daily as needed for cough. 20 capsule 0   bisoprolol-hydrochlorothiazide (ZIAC) 10-6.25 MG tablet TAKE 1 TABLET DAILY 90 tablet 3   Blood Glucose Monitoring Suppl (ONETOUCH VERIO) w/Device KIT 1 Units by Does not apply route daily. 1 kit 0   citalopram (CELEXA) 40 MG tablet Take 1 tablet (40 mg total) by mouth daily. Reported on 09/19/2015 90 tablet 1   Continuous Blood Gluc Sensor (FREESTYLE LIBRE 14 DAY SENSOR) MISC USE WITH LIBRE APP 6 each 3   fluticasone (FLONASE) 50 MCG/ACT nasal spray USE 2 SPRAYS IN EACH NOSTRIL DAILY 48 g 3   gabapentin (NEURONTIN) 400 MG capsule TAKE 2 CAPSULES THREE TIMES A DAY 540 capsule 1   glipiZIDE (GLUCOTROL XL) 10 MG 24 hr tablet TAKE 1 TABLET DAILY WITH BREAKFAST 90 tablet 3   glipiZIDE (GLUCOTROL XL) 5 MG 24 hr tablet TAKE 1 TABLET BY MOUTH EVERY DAY IN THE EVENING 90 tablet 0   ibuprofen (ADVIL,MOTRIN) 600 MG tablet Take 1 tablet (600 mg total) by mouth every 8 (eight) hours as needed. 20 tablet 0   levothyroxine (SYNTHROID) 112 MCG tablet TAKE 1 TABLET DAILY 90 tablet 3   lidocaine (XYLOCAINE) 5 % ointment Apply 1 application topically 4 (four) times daily as needed. On bottom of feet B/L 35.44 g 0   loperamide (IMODIUM A-D) 2 MG tablet Take 1 tablet (2 mg total) by mouth 4 (four) times daily as needed for diarrhea or loose stools.  30 tablet 0   magic mouthwash w/lidocaine SOLN Take 5 mLs by mouth 3 (three) times daily as needed. 180 mL 0   metFORMIN (GLUCOPHAGE) 1000 MG tablet TAKE 1 TABLET DAILY WITH BREAKFAST 90 tablet 3   nitroGLYCERIN (NITROSTAT) 0.4 MG SL tablet Place 1 tablet (0.4 mg total) under the  tongue every 5 (five) minutes as needed for chest pain (CP or SOB). 60 tablet 12   ONE TOUCH LANCETS MISC Test blood sugars once daily Dx: E11.9 100 each 3   ONETOUCH VERIO test strip USE TO TEST BLOOD GLUCOSE THREE TIMES A DAY 300 strip 3   oxybutynin (DITROPAN-XL) 5 MG 24 hr tablet TAKE 1 TABLET DAILY AT BEDTIME 90 tablet 3   risperiDONE (RISPERDAL) 1 MG tablet Take 1 mg by mouth 2 (two) times daily.  2   simvastatin (ZOCOR) 20 MG tablet TAKE 1 TABLET AT BEDTIME 90 tablet 3   traZODone (DESYREL) 100 MG tablet Take 1 tablet (100 mg total) by mouth at bedtime. Reported on 09/19/2015 90 tablet 1   TRULICITY 3 VE/9.3YB SOPN INJECT 3 MG AS DIRECTED ONCE A WEEK 6 mL 3   naproxen (NAPROSYN) 500 MG tablet Take 1 tablet (500 mg total) by mouth 2 (two) times daily with a meal. 60 tablet 0   No current facility-administered medications on file prior to visit.    BP 120/80   Pulse 88   Temp 98 F (36.7 C) (Oral)   Ht '5\' 2"'  (1.575 m)   Wt 176 lb (79.8 kg)   SpO2 98%   BMI 32.19 kg/m       Objective:   Physical Exam Vitals and nursing note reviewed.  Constitutional:      General: She is not in acute distress.    Appearance: Normal appearance. She is well-developed. She is not ill-appearing.  HENT:     Head: Normocephalic and atraumatic.     Right Ear: Tympanic membrane, ear canal and external ear normal. There is no impacted cerumen.     Left Ear: Tympanic membrane, ear canal and external ear normal. There is no impacted cerumen.     Nose: Nose normal. No congestion or rhinorrhea.     Mouth/Throat:     Mouth: Mucous membranes are moist.     Pharynx: Oropharynx is clear. No oropharyngeal exudate or posterior  oropharyngeal erythema.  Eyes:     General:        Right eye: No discharge.        Left eye: No discharge.     Extraocular Movements: Extraocular movements intact.     Conjunctiva/sclera: Conjunctivae normal.     Pupils: Pupils are equal, round, and reactive to light.  Neck:     Thyroid: No thyromegaly.     Vascular: No carotid bruit.     Trachea: No tracheal deviation.  Cardiovascular:     Rate and Rhythm: Normal rate and regular rhythm.     Pulses: Normal pulses.     Heart sounds: Normal heart sounds. No murmur heard.   No friction rub. No gallop.  Pulmonary:     Effort: Pulmonary effort is normal. No respiratory distress.     Breath sounds: Normal breath sounds. No stridor. No wheezing, rhonchi or rales.  Chest:     Chest wall: No tenderness.  Abdominal:     General: Abdomen is flat. Bowel sounds are normal. There is no distension.     Palpations: Abdomen is soft. There is no mass.     Tenderness: There is no abdominal tenderness. There is no right CVA tenderness, left CVA tenderness, guarding or rebound.     Hernia: No hernia is present.  Musculoskeletal:        General: No swelling, tenderness, deformity or signs of injury. Normal range of motion.     Cervical  back: Normal range of motion and neck supple.     Right lower leg: No edema.     Left lower leg: No edema.  Lymphadenopathy:     Cervical: No cervical adenopathy.  Skin:    General: Skin is warm and dry.     Coloration: Skin is not jaundiced or pale.     Findings: No bruising, erythema, lesion or rash.  Neurological:     General: No focal deficit present.     Mental Status: She is alert and oriented to person, place, and time.     Cranial Nerves: No cranial nerve deficit.     Sensory: No sensory deficit.     Motor: No weakness.     Coordination: Coordination normal.     Gait: Gait normal.     Deep Tendon Reflexes: Reflexes normal.  Psychiatric:        Mood and Affect: Mood normal.        Behavior: Behavior  normal.        Thought Content: Thought content normal.        Judgment: Judgment normal.      Assessment & Plan:   1. Type 2 diabetes mellitus with diabetic neuropathy, without long-term current use of insulin (HCC)  - POC HgB A1c - 6.4- improved and at goal.  - No change in medications  - Try to eat a less carb heavy meal in the morning.  - Follow up in three months for CPE  - glipiZIDE (GLUCOTROL XL) 10 MG 24 hr tablet; Take 1 tablet (10 mg total) by mouth daily with breakfast.  Dispense: 90 tablet; Refill: 0  2. Essential hypertension - Well controlled. No change in medications   3. Need for shingles vaccine  - Varicella-zoster vaccine IM  Dorothyann Peng, NP

## 2021-02-15 NOTE — Patient Instructions (Addendum)
Please call  GI to get your colonoscopy scheduled. Phone: [phone]  Please schedule your physical in three months

## 2021-02-17 ENCOUNTER — Ambulatory Visit
Admission: RE | Admit: 2021-02-17 | Discharge: 2021-02-17 | Disposition: A | Discharge status: Home / Self Care | Payer: BC Managed Care – PPO | Source: Ambulatory Visit | Attending: Adult Health | Admitting: Adult Health

## 2021-02-17 ENCOUNTER — Other Ambulatory Visit: Payer: Self-pay

## 2021-02-17 DIAGNOSIS — Z1231 Encounter for screening mammogram for malignant neoplasm of breast: Secondary | ICD-10-CM

## 2021-03-05 ENCOUNTER — Ambulatory Visit (AMBULATORY_SURGERY_CENTER): Payer: BC Managed Care – PPO | Admitting: *Deleted

## 2021-03-05 ENCOUNTER — Other Ambulatory Visit: Payer: Self-pay

## 2021-03-05 ENCOUNTER — Other Ambulatory Visit: Payer: Self-pay | Admitting: Gastroenterology

## 2021-03-05 ENCOUNTER — Telehealth: Payer: Self-pay | Admitting: Gastroenterology

## 2021-03-05 VITALS — Ht 62.0 in | Wt 179.0 lb

## 2021-03-05 DIAGNOSIS — Z1211 Encounter for screening for malignant neoplasm of colon: Secondary | ICD-10-CM

## 2021-03-05 MED ORDER — NA SULFATE-K SULFATE-MG SULF 17.5-3.13-1.6 GM/177ML PO SOLN: 1.0000 | Freq: Once | ORAL | 0 refills | Status: AC

## 2021-03-05 MED ORDER — PEG 3350-KCL-NA BICARB-NACL 420 G PO SOLR: 4000.0000 mL | Freq: Once | ORAL | 0 refills | Status: AC

## 2021-03-05 NOTE — Telephone Encounter (Signed)
Looks like previsit sent in [place] for this patient today instead of the Moviprep

## 2021-03-05 NOTE — Progress Notes (Signed)
No egg or soy allergy known to patient  No issues with past sedation with any surgeries or procedures Patient denies ever being told they had issues or difficulty with intubation  No FH of Malignant Hyperthermia No diet pills per patient No home 02 use per patient  No blood thinners per patient  Pt denies issues with constipation  No A fib or A flutter  EMMI video to pt or via [place] 19 guidelines implemented in [place] today with Pt and RN  Pt is fully vaccinated  for Covid   Suprep appears to be covered under pt's insurance plan  Due to the COVID-19 pandemic we are asking patients to follow certain guidelines.  Pt aware of COVID protocols and [facility] guidelines   Pt verified name, DOB, address and insurance during PV today. Pt mailed instruction packet to included paper to complete and mail back to [facility] with addressed and stamped envelope, Emmi video, copy of consent form to read and not return, and instructions.  Pt encouraged to call with questions or issues.  My Chart instructions to pt as well

## 2021-03-05 NOTE — Telephone Encounter (Signed)
SENT IN GOLYTELY PREP TO PHARMACY  Called pt- we went over Golytely prep over the phobe, sent in new Golytely instructions in her My Chart and mailed her the new instructions as well - pt aware to get Dulcolax 5 mg laxative tablets and that she needs 4 tablets

## 2021-03-07 ENCOUNTER — Encounter: Payer: Self-pay | Admitting: Adult Health

## 2021-03-07 ENCOUNTER — Telehealth (INDEPENDENT_AMBULATORY_CARE_PROVIDER_SITE_OTHER): Payer: BC Managed Care – PPO | Admitting: Adult Health

## 2021-03-07 VITALS — Ht 62.0 in | Wt 179.0 lb

## 2021-03-07 DIAGNOSIS — B379 Candidiasis, unspecified: Secondary | ICD-10-CM

## 2021-03-07 MED ORDER — FLUCONAZOLE 150 MG PO TABS: ORAL_TABLET | ORAL | 0 refills | Status: DC

## 2021-03-07 MED ORDER — CLOTRIMAZOLE-BETAMETHASONE 1-0.05 % EX CREA: 1.0000 "application " | TOPICAL_CREAM | Freq: Every day | CUTANEOUS | 0 refills | Status: DC

## 2021-03-07 NOTE — Progress Notes (Signed)
Virtual Visit via Video Note  I connected with Stanford Breed on 03/07/21 at 11:00 AM EDT by a video enabled telemedicine application and verified that I am speaking with the correct person using two identifiers.  Location patient: home Location provider:work or home office Persons participating in the virtual visit: patient, provider  I discussed the limitations of evaluation and management by telemedicine and the availability of in person appointments. The patient expressed understanding and agreed to proceed.   HPI: 60 year old female who  has a past medical history of Allergy, Anemia, Blood transfusion without reported diagnosis, Diabetes mellitus ([place]), Fatty liver, GERD (gastroesophageal reflux disease), Hiatal hernia (07/29/1996), Hyperlipidemia, Hyperplastic colon polyp, Hypertension, Hyperthyroidism, Hypothyroidism, Neuromuscular disorder ([place]), Rotator cuff tear, right, Sleep apnea, and Stroke ([place]).  She is being evaluated today for an acute issue.  Her symptoms started roughly 4 days ago.  She reports a red rash under her right breast along her right flank.  Rashes painful and itchy.  Denies drainage or pustules.  She has not been using anything over-the-counter to help with her symptoms   ROS: See pertinent positives and negatives per HPI.  Past Medical History:  Diagnosis Date   Allergy    Anemia    past hx   Blood transfusion without reported diagnosis    Diabetes mellitus ([place])    Fatty liver    GERD (gastroesophageal reflux disease)    prn med.   Hiatal hernia 07/29/1996   Hyperlipidemia    Hyperplastic colon polyp    Hypertension    new dx. - will start med. 06/28/2011   Hyperthyroidism    Hypothyroidism    Neuromuscular disorder ([place])    hemiplegia , neuropathy   Rotator cuff tear, right    Sleep apnea    prior to gastric bypass - no sleep study since bypass   Stroke [facility])    TIA 2016    Past Surgical History:  Procedure Laterality Date   ABDOMINAL  HYSTERECTOMY  06/25/2000   left salpingo-oophorectomy   BACK SURGERY  07/30/1995   discetomy, lumbar spinal fusion   CARDIAC CATHETERIZATION  04/01/2006   CESAREAN SECTION  07/29/1989   CHOLECYSTECTOMY  07/30/1987   COLONOSCOPY     EXPLORATORY LAPAROTOMY  05/08/2000   right salpingo-oophorectomy   LAMINOTOMY / EXCISION DISK POSTERIOR CERVICAL SPINE  08/10/2009   C7-T1   POLYPECTOMY     [facility] 2012   ROUX-EN-Y GASTRIC BYPASS  05/19/2007   SHOULDER ARTHROSCOPY  01/02/2006; 05/31/2004   left 2007; right 2005   SHOULDER ARTHROSCOPY  07/02/2011   Procedure: ARTHROSCOPY SHOULDER;  Surgeon: Cammie Sickle., MD;  Location: [place];  Service: Orthopedics;  Laterality: Right;  repair supraspinatus, Debride glenohumeral joint, labrum   SHOULDER ARTHROSCOPY DISTAL CLAVICLE EXCISION AND OPEN ROTATOR CUFF REPAIR  07/26/2010   right   THYROID SURGERY  early 2000s   goiter   TUBAL LIGATION      Family History  Problem Relation Age of Onset   Diabetes Mother    Hyperlipidemia Mother    Hypertension Mother    Thyroid disease Mother    Rheum arthritis Mother    COPD Mother    Cancer Paternal Grandfather        Prostate Cancer   Coronary artery disease Other    Colon cancer Neg Hx    Colon polyps Neg Hx    Esophageal cancer Neg Hx    Rectal cancer Neg Hx    Stomach cancer  Neg Hx    Pancreatic cancer Neg Hx        Current Outpatient Medications:    B Complex Vitamins (B COMPLEX PO), Take by mouth., Disp: , Rfl:    benzonatate (TESSALON) 100 MG capsule, Take 1 capsule (100 mg total) by mouth 2 (two) times daily as needed for cough., Disp: 20 capsule, Rfl: 0   BIOTIN PO, Take by mouth., Disp: , Rfl:    bisoprolol-hydrochlorothiazide (ZIAC) 10-6.25 MG tablet, TAKE 1 TABLET DAILY, Disp: 90 tablet, Rfl: 3   Blood Glucose Monitoring Suppl (ONETOUCH VERIO) w/Device KIT, 1 Units by Does not apply route daily., Disp: 1 kit, Rfl: 0   Cholecalciferol (VITAMIN D3 PO), Take by  mouth., Disp: , Rfl:    citalopram (CELEXA) 40 MG tablet, Take 1 tablet (40 mg total) by mouth daily. Reported on 09/19/2015, Disp: 90 tablet, Rfl: 1   Continuous Blood Gluc Sensor (FREESTYLE LIBRE 14 DAY SENSOR) MISC, USE WITH LIBRE APP, Disp: 6 each, Rfl: 3   ferrous sulfate 325 (65 FE) MG EC tablet, Take 325 mg by mouth 3 (three) times daily with meals., Disp: , Rfl:    fluticasone (FLONASE) 50 MCG/ACT nasal spray, USE 2 SPRAYS IN EACH NOSTRIL DAILY (Patient taking differently: as needed.), Disp: 48 g, Rfl: 3   gabapentin (NEURONTIN) 400 MG capsule, TAKE 2 CAPSULES THREE TIMES A DAY, Disp: 540 capsule, Rfl: 1   glipiZIDE (GLUCOTROL XL) 10 MG 24 hr tablet, Take 1 tablet (10 mg total) by mouth daily with breakfast., Disp: 90 tablet, Rfl: 0   glipiZIDE (GLUCOTROL XL) 5 MG 24 hr tablet, TAKE 1 TABLET BY MOUTH EVERY DAY IN THE EVENING, Disp: 90 tablet, Rfl: 0   HYDROcodone-acetaminophen (NORCO/VICODIN) 5-325 MG tablet, Take 1 tablet by mouth 3 (three) times daily., Disp: , Rfl:    ibuprofen (ADVIL,MOTRIN) 600 MG tablet, Take 1 tablet (600 mg total) by mouth every 8 (eight) hours as needed., Disp: 20 tablet, Rfl: 0   levothyroxine (SYNTHROID) 112 MCG tablet, TAKE 1 TABLET DAILY, Disp: 90 tablet, Rfl: 3   lidocaine (XYLOCAINE) 5 % ointment, Apply 1 application topically 4 (four) times daily as needed. On bottom of feet B/L, Disp: 35.44 g, Rfl: 0   loperamide (IMODIUM A-D) 2 MG tablet, Take 1 tablet (2 mg total) by mouth 4 (four) times daily as needed for diarrhea or loose stools., Disp: 30 tablet, Rfl: 0   meloxicam (MOBIC) 7.5 MG tablet, Take 7.5 mg by mouth daily., Disp: , Rfl:    metFORMIN (GLUCOPHAGE) 1000 MG tablet, TAKE 1 TABLET DAILY WITH BREAKFAST, Disp: 90 tablet, Rfl: 3   nitroGLYCERIN (NITROSTAT) 0.4 MG SL tablet, Place 1 tablet (0.4 mg total) under the tongue every 5 (five) minutes as needed for chest pain (CP or SOB)., Disp: 60 tablet, Rfl: 12   ONE TOUCH LANCETS MISC, Test blood sugars once  daily Dx: E11.9, Disp: 100 each, Rfl: 3   ONETOUCH VERIO test strip, USE TO TEST BLOOD GLUCOSE THREE TIMES A DAY, Disp: 300 strip, Rfl: 3   oxybutynin (DITROPAN-XL) 5 MG 24 hr tablet, TAKE 1 TABLET DAILY AT BEDTIME, Disp: 90 tablet, Rfl: 3   risperiDONE (RISPERDAL) 1 MG tablet, Take 1 mg by mouth 2 (two) times daily., Disp: , Rfl: 2   simvastatin (ZOCOR) 20 MG tablet, TAKE 1 TABLET AT BEDTIME, Disp: 90 tablet, Rfl: 3   traZODone (DESYREL) 100 MG tablet, Take 1 tablet (100 mg total) by mouth at bedtime. Reported on 09/19/2015, Disp: 90  tablet, Rfl: 1   TRULICITY 3 GT/3.6IW SOPN, INJECT 3 MG AS DIRECTED ONCE A WEEK, Disp: 6 mL, Rfl: 3   VITAMIN E PO, Take by mouth., Disp: , Rfl:   EXAM:  VITALS per patient if applicable:  GENERAL: alert, oriented, appears well and in no acute distress  HEENT: atraumatic, conjunttiva clear, no obvious abnormalities on inspection of external nose and ears  NECK: normal movements of the head and neck  LUNGS: on inspection no signs of respiratory distress, breathing rate appears normal, no obvious gross SOB, gasping or wheezing  CV: no obvious cyanosis  MS: moves all visible extremities without noticeable abnormality  PSYCH/NEURO: pleasant and cooperative, no obvious depression or anxiety, speech and thought processing grossly intact  SKIN: Flat red rash with hyperpigment areas noted on right flank.  No pustules or vesicles present.  ASSESSMENT AND PLAN:  Discussed the following assessment and plan:  1. Candida infection -Does not appear shingles, appears more fungal in nature.  We will treat with Diflucan x2 doses and Lotrisone cream.  She was advised to follow-up early next week if no improvement - fluconazole (DIFLUCAN) 150 MG tablet; Take one tablet and three days later take second tablet  Dispense: 2 tablet; Refill: 0 - clotrimazole-betamethasone (LOTRISONE) cream; Apply 1 application topically daily. Apply thin layer to affected area twice daily   Dispense: 30 g; Refill: 0  I discussed the assessment and treatment plan with the patient. The patient was provided an opportunity to ask questions and all were answered. The patient agreed with the plan and demonstrated an understanding of the instructions.   The patient was advised to call back or seek an in-person evaluation if the symptoms worsen or if the condition fails to improve as anticipated.   Dorothyann Peng, NP

## 2021-03-15 ENCOUNTER — Encounter: Payer: Self-pay | Admitting: Gastroenterology

## 2021-03-16 ENCOUNTER — Other Ambulatory Visit: Payer: Self-pay

## 2021-03-16 ENCOUNTER — Ambulatory Visit (AMBULATORY_SURGERY_CENTER): Payer: BC Managed Care – PPO | Admitting: Gastroenterology

## 2021-03-16 ENCOUNTER — Encounter: Payer: Self-pay | Admitting: Gastroenterology

## 2021-03-16 VITALS — BP 128/80 | HR 67 | Temp 97.2°F | Resp 13 | Ht 62.0 in | Wt 179.0 lb

## 2021-03-16 DIAGNOSIS — D124 Benign neoplasm of descending colon: Secondary | ICD-10-CM

## 2021-03-16 DIAGNOSIS — Z1211 Encounter for screening for malignant neoplasm of colon: Secondary | ICD-10-CM

## 2021-03-16 MED ORDER — SODIUM CHLORIDE 0.9 % IV SOLN: 500.0000 mL | Freq: Once | INTRAVENOUS | Status: DC

## 2021-03-16 NOTE — Progress Notes (Signed)
Vs by cw

## 2021-03-16 NOTE — Op Note (Signed)
[facility] Patient Name: Alexandra [name] Procedure Date: 03/16/2021 8:17 AM MRN: [id number] Endoscopist: Mauri Pole , MD Age: 60 Referring MD:  Date of Birth: 27-Aug-1960 Gender: Female Account #: [id number] Procedure:                Colonoscopy Indications:              Screening for colorectal malignant neoplasm Medicines:                Monitored Anesthesia Care Procedure:                Pre-Anesthesia Assessment:                           - Prior to the procedure, a History and Physical                            was performed, and patient medications and                            allergies were reviewed. The patient's tolerance of                            previous anesthesia was also reviewed. The risks                            and benefits of the procedure and the sedation                            options and risks were discussed with the patient.                            All questions were answered, and informed consent                            was obtained. Prior Anticoagulants: The patient has                            taken no previous anticoagulant or antiplatelet                            agents. ASA Grade Assessment: III - A patient with                            severe systemic disease. After reviewing the risks                            and benefits, the patient was deemed in                            satisfactory condition to undergo the procedure.                           After obtaining informed consent, the colonoscope  was passed under direct vision. Throughout the                            procedure, the patient's blood pressure, pulse, and                            oxygen saturations were monitored continuously. The                            PCF-HQ190L Colonoscope was introduced through the                            anus and advanced to the the cecum, identified by                             appendiceal orifice and ileocecal valve. The                            colonoscopy was performed without difficulty. The                            patient tolerated the procedure well. The quality                            of the bowel preparation was excellent. The                            ileocecal valve, appendiceal orifice, and rectum                            were photographed. Scope In: 8:20:35 AM Scope Out: 8:40:28 AM Scope Withdrawal Time: 0 hours 10 minutes 8 seconds  Total Procedure Duration: 0 hours 19 minutes 53 seconds  Findings:                 The perianal and digital rectal examinations were                            normal.                           Two sessile polyps were found in the descending                            colon. The polyps were 3 to 4 mm in size. These                            polyps were removed with a cold snare. Resection                            and retrieval were complete.                           Non-bleeding external and internal hemorrhoids were  found during retroflexion. The hemorrhoids were                            small.                           The exam was otherwise without abnormality. Complications:            No immediate complications. Estimated Blood Loss:     Estimated blood loss was minimal. Impression:               - Two 3 to 4 mm polyps in the descending colon,                            removed with a cold snare. Resected and retrieved.                           - Non-bleeding external and internal hemorrhoids.                           - The examination was otherwise normal. Recommendation:           - Patient has a contact number available for                            emergencies. The signs and symptoms of potential                            delayed complications were discussed with the                            patient. Return to normal activities tomorrow.                             Written discharge instructions were provided to the                            patient.                           - Resume previous diet.                           - Continue present medications.                           - Await pathology results.                           - Repeat colonoscopy in 5-10 years for surveillance                            based on pathology results. Mauri Pole, MD 03/16/2021 8:44:53 AM This report has been signed electronically.

## 2021-03-16 NOTE — Progress Notes (Signed)
Called to room to assist during endoscopic procedure.  Patient ID and intended procedure confirmed with present staff. Received instructions for my participation in the procedure from the performing physician.  

## 2021-03-16 NOTE — Patient Instructions (Signed)
Handout given:  polyps Resume previous diet Continue current medications Await pathology results  YOU HAD AN ENDOSCOPIC PROCEDURE TODAY AT [facility]:   Refer to the procedure report that was given to you for any specific questions about what was found during the examination.  If the procedure report does not answer your questions, please call your gastroenterologist to clarify.  If you requested that your care partner not be given the details of your procedure findings, then the procedure report has been included in a sealed envelope for you to review at your convenience later.  YOU SHOULD EXPECT: Some feelings of bloating in the abdomen. Passage of more gas than usual.  Walking can help get rid of the air that was put into your GI tract during the procedure and reduce the bloating. If you had a lower endoscopy (such as a colonoscopy or flexible sigmoidoscopy) you may notice spotting of blood in your stool or on the toilet paper. If you underwent a bowel prep for your procedure, you may not have a normal bowel movement for a few days.  Please Note:  You might notice some irritation and congestion in your nose or some drainage.  This is from the oxygen used during your procedure.  There is no need for concern and it should clear up in a day or so.  SYMPTOMS TO REPORT IMMEDIATELY:  Following lower endoscopy (colonoscopy or flexible sigmoidoscopy):  Excessive amounts of blood in the stool  Significant tenderness or worsening of abdominal pains  Swelling of the abdomen that is new, acute  Fever of 100F or higher  For urgent or emergent issues, a gastroenterologist can be reached at any hour by calling [phone]. Do not use MyChart messaging for urgent concerns.   DIET:  We do recommend a small meal at first, but then you may proceed to your regular diet.  Drink plenty of fluids but you should avoid alcoholic beverages for 24 hours.  ACTIVITY:  You should plan to take it  easy for the rest of today and you should NOT DRIVE or use heavy machinery until tomorrow (because of the sedation medicines used during the test).    FOLLOW UP: Our staff will call the number listed on your records 48-72 hours following your procedure to check on you and address any questions or concerns that you may have regarding the information given to you following your procedure. If we do not reach you, we will leave a message.  We will attempt to reach you two times.  During this call, we will ask if you have developed any symptoms of COVID 19. If you develop any symptoms (ie: fever, flu-like symptoms, shortness of breath, cough etc.) before then, please call [phone].  If you test positive for Covid 19 in the 2 weeks post procedure, please call and report this information to [place].    If any biopsies were taken you will be contacted by phone or by letter within the next 1-3 weeks.  Please call us at [phone] if you have not heard about the biopsies in 3 weeks.   SIGNATURES/CONFIDENTIALITY: You and/or your care partner have signed paperwork which will be entered into your electronic medical record.  These signatures attest to the fact that that the information above on your After Visit Summary has been reviewed and is understood.  Full responsibility of the confidentiality of this discharge information lies with you and/or your care-partner.

## 2021-03-16 NOTE — Progress Notes (Signed)
Report to PACU, RN, vss, BBS= Clear.  

## 2021-03-17 ENCOUNTER — Other Ambulatory Visit: Payer: Self-pay | Admitting: Adult Health

## 2021-03-17 DIAGNOSIS — E1140 Type 2 diabetes mellitus with diabetic neuropathy, unspecified: Secondary | ICD-10-CM

## 2021-03-20 ENCOUNTER — Telehealth: Payer: Self-pay | Admitting: *Deleted

## 2021-03-20 NOTE — Telephone Encounter (Signed)
Have you developed a fever since your procedure ? no  2.   Have you had an respiratory symptoms (SOB or cough) since your procedure? no  3.   Have you tested positive for COVID 19 since your procedure?no  4.   Have you had any family members/close contacts diagnosed with the COVID 19 since your procedure?  no   If yes to any of these questions please route to Joylene John, RN and Joella Prince, RN  Follow up Call-  Call back number 03/16/2021  Post procedure Call Back phone  # [phone]  Permission to leave phone message Yes  Some recent data might be hidden     Patient questions:  Do you have a fever, pain , or abdominal swelling? No. Pain Score  0 *  Have you tolerated food without any problems? Yes.    Have you been able to return to your normal activities? Yes.    Do you have any questions about your discharge instructions: Diet   No. Medications  No. Follow up visit  No.  Do you have questions or concerns about your Care? No.  Actions: * If pain score is 4 or above:no pain

## 2021-03-21 ENCOUNTER — Encounter: Payer: Self-pay | Admitting: Adult Health

## 2021-03-23 ENCOUNTER — Ambulatory Visit (INDEPENDENT_AMBULATORY_CARE_PROVIDER_SITE_OTHER): Payer: BC Managed Care – PPO | Admitting: Adult Health

## 2021-03-23 ENCOUNTER — Other Ambulatory Visit: Payer: Self-pay

## 2021-03-23 VITALS — BP 134/80 | HR 92 | Temp 97.8°F | Ht 62.0 in | Wt 179.0 lb

## 2021-03-23 DIAGNOSIS — Z01818 Encounter for other preprocedural examination: Secondary | ICD-10-CM

## 2021-03-23 LAB — CBC WITH DIFFERENTIAL/PLATELET
Basophils Absolute: 0 10*3/uL (ref 0.0–0.1)
Basophils Relative: 0.4 % (ref 0.0–3.0)
Eosinophils Absolute: 0.2 10*3/uL (ref 0.0–0.7)
Eosinophils Relative: 2 % (ref 0.0–5.0)
HCT: 39.9 % (ref 36.0–46.0)
Hemoglobin: 13.3 g/dL (ref 12.0–15.0)
Lymphocytes Relative: 28.7 % (ref 12.0–46.0)
Lymphs Abs: 2.6 10*3/uL (ref 0.7–4.0)
MCHC: 33.4 g/dL (ref 30.0–36.0)
MCV: 96.9 fl (ref 78.0–100.0)
Monocytes Absolute: 0.5 10*3/uL (ref 0.1–1.0)
Monocytes Relative: 6.1 % (ref 3.0–12.0)
Neutro Abs: 5.7 10*3/uL (ref 1.4–7.7)
Neutrophils Relative %: 62.8 % (ref 43.0–77.0)
Platelets: 306 10*3/uL (ref 150.0–400.0)
RBC: 4.12 Mil/uL (ref 3.87–5.11)
RDW: 13.6 % (ref 11.5–15.5)
WBC: 9 10*3/uL (ref 4.0–10.5)

## 2021-03-23 LAB — PROTIME-INR
INR: 1 ratio (ref 0.8–1.0)
Prothrombin Time: 10.8 s (ref 9.6–13.1)

## 2021-03-23 LAB — BASIC METABOLIC PANEL
BUN: 11 mg/dL (ref 6–23)
CO2: 25 mEq/L (ref 19–32)
Calcium: 9 mg/dL (ref 8.4–10.5)
Chloride: 102 mEq/L (ref 96–112)
Creatinine, Ser: 0.84 mg/dL (ref 0.40–1.20)
GFR: 75.22 mL/min (ref >60.00)
Glucose, Bld: 287 mg/dL — ABNORMAL HIGH (ref 70–99)
Potassium: 3.6 mEq/L (ref 3.5–5.1)
Sodium: 138 mEq/L (ref 135–145)

## 2021-03-23 NOTE — Progress Notes (Signed)
Subjective:    Patient ID: Alexandra [name], female    DOB: [birth year], 60 y.o.   MRN: [id number]  HPI 60 year old female who  has a past medical history of Allergy, Anemia, Blood transfusion without reported diagnosis, Diabetes mellitus ([place]), Fatty liver, GERD (gastroesophageal reflux disease), Hiatal hernia (07/29/1996), Hyperlipidemia, Hyperplastic colon polyp, Hypertension, Hyperthyroidism, Hypothyroidism, Neuromuscular disorder ([place]), Rotator cuff tear, right, Sleep apnea, and Stroke ([place]).  She presents to the office today for presurgical clearance. She will be having a right knee scope/medial menisectomy done by Dr. Edmonia Lynch. Date of surgery has not been schedule yet   She does not have any questions about the surgery    Review of Systems See HPI   Past Medical History:  Diagnosis Date   Allergy    Anemia    past hx   Blood transfusion without reported diagnosis    Diabetes mellitus ([place])    Fatty liver    GERD (gastroesophageal reflux disease)    prn med.   Hiatal hernia 07/29/1996   Hyperlipidemia    Hyperplastic colon polyp    Hypertension    new dx. - will start med. 06/28/2011   Hyperthyroidism    Hypothyroidism    Neuromuscular disorder ([facility])    hemiplegia , neuropathy   Rotator cuff tear, right    Sleep apnea    prior to gastric bypass - no sleep study since bypass   Stroke [facility])    TIA 2016    Social History   Socioeconomic History   Marital status: Married    Spouse name: Not on file   Number of children: Not on file   Years of education: Not on file   Highest education level: Not on file  Occupational History   Occupation: unemployed  Tobacco Use   Smoking status: Former    Packs/day: 1.00    Years: 10.00    Pack years: 10.00    Types: Cigarettes    Quit date: 07/29/1984    Years since quitting: 36.6   Smokeless tobacco: Never  Vaping Use   Vaping Use: Never used  Substance and Sexual Activity   Alcohol use: Yes    Comment:  occassionally   Drug use: No   Sexual activity: Yes  Other Topics Concern   Not on file  Social History Narrative   Married for 24 years    Six children- all live around here. Two daughters live with her   Six grand children    On Disability for mental issues   No pets   Likes to go to movies and out to dinner. She likes to do puzzle books. Likes to read      She eats a healthy diet   She does not exercise          Social Determinants of Health   Financial Resource Strain: Not on file  Food Insecurity: Not on file  Transportation Needs: Not on file  Physical Activity: Not on file  Stress: Not on file  Social Connections: Not on file  Intimate Partner Violence: Not on file    Past Surgical History:  Procedure Laterality Date   ABDOMINAL HYSTERECTOMY  06/25/2000   left salpingo-oophorectomy   BACK SURGERY  07/30/1995   discetomy, lumbar spinal fusion   CARDIAC CATHETERIZATION  04/01/2006   CESAREAN SECTION  07/29/1989   CHOLECYSTECTOMY  07/30/1987   COLONOSCOPY     EXPLORATORY LAPAROTOMY  05/08/2000   right salpingo-oophorectomy   LAMINOTOMY /  EXCISION DISK POSTERIOR CERVICAL SPINE  08/10/2009   C7-T1   POLYPECTOMY     HPP 2012   ROUX-EN-Y GASTRIC BYPASS  05/19/2007   SHOULDER ARTHROSCOPY  01/02/2006; 05/31/2004   left 2007; right 2005   SHOULDER ARTHROSCOPY  07/02/2011   Procedure: ARTHROSCOPY SHOULDER;  Surgeon: Cammie Sickle., MD;  Location: [facility];  Service: Orthopedics;  Laterality: Right;  repair supraspinatus, Debride glenohumeral joint, labrum   SHOULDER ARTHROSCOPY DISTAL CLAVICLE EXCISION AND OPEN ROTATOR CUFF REPAIR  07/26/2010   right   THYROID SURGERY  early 2000s   goiter   TUBAL LIGATION      Family History  Problem Relation Age of Onset   Diabetes Mother    Hyperlipidemia Mother    Hypertension Mother    Thyroid disease Mother    Rheum arthritis Mother    COPD Mother    Cancer Paternal Grandfather        Prostate  Cancer   Coronary artery disease Other    Colon cancer Neg Hx    Colon polyps Neg Hx    Esophageal cancer Neg Hx    Rectal cancer Neg Hx    Stomach cancer Neg Hx    Pancreatic cancer Neg Hx     Allergies  Allergen Reactions   Shrimp Flavor Swelling    Swelling of tongue and rash   Ace Inhibitors Cough    Current Outpatient Medications on File Prior to Visit  Medication Sig Dispense Refill   B Complex Vitamins (B COMPLEX PO) Take by mouth.     benzonatate (TESSALON) 100 MG capsule Take 1 capsule (100 mg total) by mouth 2 (two) times daily as needed for cough. 20 capsule 0   BIOTIN PO Take by mouth.     bisoprolol-hydrochlorothiazide (ZIAC) 10-6.25 MG tablet TAKE 1 TABLET DAILY 90 tablet 3   Blood Glucose Monitoring Suppl (ONETOUCH VERIO) w/Device KIT 1 Units by Does not apply route daily. 1 kit 0   Cholecalciferol (VITAMIN D3 PO) Take by mouth.     citalopram (CELEXA) 40 MG tablet Take 1 tablet (40 mg total) by mouth daily. Reported on 09/19/2015 90 tablet 1   clotrimazole-betamethasone (LOTRISONE) cream Apply 1 application topically daily. Apply thin layer to affected area twice daily 30 g 0   Continuous Blood Gluc Sensor (FREESTYLE LIBRE 14 DAY SENSOR) MISC USE WITH LIBRE APP 6 each 3   ferrous sulfate 325 (65 FE) MG EC tablet Take 325 mg by mouth 3 (three) times daily with meals.     fluticasone (FLONASE) 50 MCG/ACT nasal spray USE 2 SPRAYS IN EACH NOSTRIL DAILY (Patient taking differently: as needed.) 48 g 3   gabapentin (NEURONTIN) 400 MG capsule TAKE 2 CAPSULES THREE TIMES A DAY 540 capsule 1   glipiZIDE (GLUCOTROL XL) 10 MG 24 hr tablet Take 1 tablet (10 mg total) by mouth daily with breakfast. 90 tablet 0   glipiZIDE (GLUCOTROL XL) 5 MG 24 hr tablet TAKE 1 TABLET BY MOUTH EVERY DAY IN THE EVENING 90 tablet 0   HYDROcodone-acetaminophen (NORCO/VICODIN) 5-325 MG tablet Take 1 tablet by mouth 3 (three) times daily.     ibuprofen (ADVIL,MOTRIN) 600 MG tablet Take 1 tablet (600 mg  total) by mouth every 8 (eight) hours as needed. 20 tablet 0   levothyroxine (SYNTHROID) 112 MCG tablet TAKE 1 TABLET DAILY 90 tablet 3   lidocaine (XYLOCAINE) 5 % ointment Apply 1 application topically 4 (four) times daily as needed. On bottom of  feet B/L 35.44 g 0   loperamide (IMODIUM A-D) 2 MG tablet Take 1 tablet (2 mg total) by mouth 4 (four) times daily as needed for diarrhea or loose stools. 30 tablet 0   meloxicam (MOBIC) 7.5 MG tablet Take 7.5 mg by mouth daily.     metFORMIN (GLUCOPHAGE) 1000 MG tablet TAKE 1 TABLET DAILY WITH BREAKFAST 90 tablet 3   nitroGLYCERIN (NITROSTAT) 0.4 MG SL tablet Place 1 tablet (0.4 mg total) under the tongue every 5 (five) minutes as needed for chest pain (CP or SOB). 60 tablet 12   ONE TOUCH LANCETS MISC Test blood sugars once daily Dx: E11.9 100 each 3   ONETOUCH VERIO test strip USE TO TEST BLOOD GLUCOSE THREE TIMES A DAY 300 strip 3   oxybutynin (DITROPAN-XL) 5 MG 24 hr tablet TAKE 1 TABLET DAILY AT BEDTIME 90 tablet 3   risperiDONE (RISPERDAL) 1 MG tablet Take 1 mg by mouth 2 (two) times daily.  2   simvastatin (ZOCOR) 20 MG tablet TAKE 1 TABLET AT BEDTIME 90 tablet 3   traZODone (DESYREL) 100 MG tablet Take 1 tablet (100 mg total) by mouth at bedtime. Reported on 09/19/2015 90 tablet 1   TRULICITY 3 FX/5.8IT SOPN INJECT 3 MG AS DIRECTED ONCE A WEEK 6 mL 3   VITAMIN E PO Take by mouth.     No current facility-administered medications on file prior to visit.    BP 134/80   Pulse 92   Temp 97.8 F (36.6 C) (Oral)   Ht _0  (1.575 m)   Wt 179 lb (81.2 kg)   SpO2 96%   BMI 32.74 kg/m       Objective:   Physical Exam Vitals and nursing note reviewed.  Cardiovascular:     Rate and Rhythm: Normal rate and regular rhythm.     Pulses: Normal pulses.     Heart sounds: Normal heart sounds.  Musculoskeletal:        General: Normal range of motion.  Skin:    General: Skin is warm and dry.     Capillary Refill: Capillary refill takes less  than 2 seconds.  Neurological:     General: No focal deficit present.     Mental Status: She is oriented to person, place, and time.  Psychiatric:        Mood and Affect: Mood normal.        Behavior: Behavior normal.        Thought Content: Thought content normal.        Judgment: Judgment normal.      Assessment & Plan:  1. Pre-operative clearance  - EKG 12-Lead- SR, rate 90; Prolonged QT interval 404/494 - likely from celexa. She will get me the name of her psychiatrist so we can notify, she may need to change SSRI's  - A1c done last month was 6.3  - CBC with Differential/Platelet; Future - Basic Metabolic Panel; Future - Protime-INR; Future   Dorothyann Peng, NP

## 2021-03-23 NOTE — Addendum Note (Signed)
Addended by: Amanda Cockayne on: 03/23/2021 09:58 AM   Modules accepted: Orders

## 2021-03-23 NOTE — Patient Instructions (Signed)
It was great seeing you today   Your EKG was normal.   We will follow up with you regarding your blood work   Best of luck on the upcoming surgery

## 2021-03-26 ENCOUNTER — Encounter: Payer: Self-pay | Admitting: Gastroenterology

## 2021-04-11 ENCOUNTER — Other Ambulatory Visit: Payer: Self-pay | Admitting: Adult Health

## 2021-04-11 DIAGNOSIS — E1140 Type 2 diabetes mellitus with diabetic neuropathy, unspecified: Secondary | ICD-10-CM

## 2021-04-28 HISTORY — PX: MENISCUS REPAIR: SHX5179

## 2021-05-10 ENCOUNTER — Other Ambulatory Visit: Payer: Self-pay

## 2021-05-10 DIAGNOSIS — E1140 Type 2 diabetes mellitus with diabetic neuropathy, unspecified: Secondary | ICD-10-CM

## 2021-05-10 MED ORDER — GLIPIZIDE ER 5 MG PO TB24: ORAL_TABLET | ORAL | 0 refills | Status: DC

## 2021-05-10 NOTE — Progress Notes (Signed)
Fax received from [facility] that pt requesting Rx be sent to [facility]. Rx sent to pharmacy.

## 2021-05-16 ENCOUNTER — Other Ambulatory Visit: Payer: Self-pay

## 2021-05-17 ENCOUNTER — Encounter: Payer: Self-pay | Admitting: Adult Health

## 2021-05-17 ENCOUNTER — Ambulatory Visit (INDEPENDENT_AMBULATORY_CARE_PROVIDER_SITE_OTHER): Payer: BC Managed Care – PPO | Admitting: Adult Health

## 2021-05-17 VITALS — BP 110/80 | HR 71 | Temp 98.0°F | Ht 61.8 in | Wt 179.0 lb

## 2021-05-17 DIAGNOSIS — G629 Polyneuropathy, unspecified: Secondary | ICD-10-CM

## 2021-05-17 DIAGNOSIS — N3281 Overactive bladder: Secondary | ICD-10-CM

## 2021-05-17 DIAGNOSIS — I10 Essential (primary) hypertension: Secondary | ICD-10-CM | POA: Diagnosis not present

## 2021-05-17 DIAGNOSIS — Z0000 Encounter for general adult medical examination without abnormal findings: Secondary | ICD-10-CM | POA: Diagnosis not present

## 2021-05-17 DIAGNOSIS — E1140 Type 2 diabetes mellitus with diabetic neuropathy, unspecified: Secondary | ICD-10-CM | POA: Diagnosis not present

## 2021-05-17 DIAGNOSIS — Z23 Encounter for immunization: Secondary | ICD-10-CM | POA: Diagnosis not present

## 2021-05-17 DIAGNOSIS — E039 Hypothyroidism, unspecified: Secondary | ICD-10-CM

## 2021-05-17 DIAGNOSIS — E782 Mixed hyperlipidemia: Secondary | ICD-10-CM

## 2021-05-17 DIAGNOSIS — E2839 Other primary ovarian failure: Secondary | ICD-10-CM

## 2021-05-17 LAB — LIPID PANEL
Cholesterol: 126 mg/dL (ref 0–200)
HDL: 38.5 mg/dL — ABNORMAL LOW (ref >39.00)
LDL Cholesterol: 72 mg/dL (ref 0–99)
NonHDL: 87.16
Total CHOL/HDL Ratio: 3
Triglycerides: 78 mg/dL (ref 0.0–149.0)
VLDL: 15.6 mg/dL (ref 0.0–40.0)

## 2021-05-17 LAB — CBC WITH DIFFERENTIAL/PLATELET
Basophils Absolute: 0 10*3/uL (ref 0.0–0.1)
Basophils Relative: 0.4 % (ref 0.0–3.0)
Eosinophils Absolute: 0.3 10*3/uL (ref 0.0–0.7)
Eosinophils Relative: 3.5 % (ref 0.0–5.0)
HCT: 38.8 % (ref 36.0–46.0)
Hemoglobin: 12.9 g/dL (ref 12.0–15.0)
Lymphocytes Relative: 32 % (ref 12.0–46.0)
Lymphs Abs: 3.1 10*3/uL (ref 0.7–4.0)
MCHC: 33.3 g/dL (ref 30.0–36.0)
MCV: 95.8 fl (ref 78.0–100.0)
Monocytes Absolute: 0.6 10*3/uL (ref 0.1–1.0)
Monocytes Relative: 5.9 % (ref 3.0–12.0)
Neutro Abs: 5.6 10*3/uL (ref 1.4–7.7)
Neutrophils Relative %: 58.2 % (ref 43.0–77.0)
Platelets: 324 10*3/uL (ref 150.0–400.0)
RBC: 4.05 Mil/uL (ref 3.87–5.11)
RDW: 13.2 % (ref 11.5–15.5)
WBC: 9.6 10*3/uL (ref 4.0–10.5)

## 2021-05-17 LAB — COMPREHENSIVE METABOLIC PANEL
ALT: 27 U/L (ref 0–35)
AST: 26 U/L (ref 0–37)
Albumin: 4 g/dL (ref 3.5–5.2)
Alkaline Phosphatase: 63 U/L (ref 39–117)
BUN: 7 mg/dL (ref 6–23)
CO2: 30 mEq/L (ref 19–32)
Calcium: 8.9 mg/dL (ref 8.4–10.5)
Chloride: 106 mEq/L (ref 96–112)
Creatinine, Ser: 0.72 mg/dL (ref 0.40–1.20)
GFR: 90.41 mL/min (ref >60.00)
Glucose, Bld: 127 mg/dL — ABNORMAL HIGH (ref 70–99)
Potassium: 3.9 mEq/L (ref 3.5–5.1)
Sodium: 144 mEq/L (ref 135–145)
Total Bilirubin: 0.5 mg/dL (ref 0.2–1.2)
Total Protein: 6.5 g/dL (ref 6.0–8.3)

## 2021-05-17 LAB — HEMOGLOBIN A1C: Hgb A1c MFr Bld: 6.7 % — ABNORMAL HIGH (ref 4.6–6.5)

## 2021-05-17 LAB — TSH: TSH: 2.58 u[IU]/mL (ref 0.35–5.50)

## 2021-05-17 NOTE — Progress Notes (Signed)
Subjective:    Patient ID: Alexandra [name], female    DOB: 1961-01-21, 60 y.o.   MRN: [id number]  HPI Patient presents for yearly preventative medicine examination. She is a pleasant 60 year old female who  has a past medical history of Allergy, Anemia, Blood transfusion without reported diagnosis, Diabetes mellitus ([place]), Fatty liver, GERD (gastroesophageal reflux disease), Hiatal hernia (07/29/1996), Hyperlipidemia, Hyperplastic colon polyp, Hypertension, Hyperthyroidism, Hypothyroidism, Neuromuscular disorder ([place]), Rotator cuff tear, right, Sleep apnea, and Stroke ([place]).  DM -maintained on glipizide XR 10 mg in the morning and 5 mg in the evening, metformin 1000 mg twice daily, and Trulicity 3 mg weekly.  She does monitor her blood sugars at home with the [facility] system and is at goal about 60% of the time.  She has been having spikes in the morning upwards of 300s with sudden glycemic events in the early afternoon.  She reports that she has been drinking a Pepsi every morning and may be having a bagel and does not eat anything for lunch.  Lab Results  Component Value Date   HGBA1C 6.3 (A) 02/15/2021   HTN -prescribed Ziac 10-6.25 mg.  This has controlled her blood pressure in the past.  She denies episodes of dizziness, lightheadedness, chest pain, shortness of breath BP Readings from Last 3 Encounters:  05/17/21 110/80  03/23/21 134/80  03/16/21 128/80   Hypothyroidism-currently prescribed Synthroid 112 mcg.  She feels well controlled on this medication Lab Results  Component Value Date   TSH 4.73 (H) 02/29/2020   Hyperlipidemia-prescribed simvastatin 20 mg nightly.  She denies myalgia or fatigue  Lab Results  Component Value Date   CHOL 116 10/27/2019   HDL 29.60 (L) 10/27/2019   LDLCALC 71 10/27/2019   TRIG 76.0 10/27/2019   CHOLHDL 4 10/27/2019   Diabetic neuropathy-prescribed gabapentin 800 mg 3 times daily.  OAB - well controlled with Ditropan XL   All  immunizations and health maintenance protocols were reviewed with the patient and needed orders were placed.  Appropriate screening laboratory values were ordered for the patient including screening of hyperlipidemia, renal function and hepatic function.  Medication reconciliation,  past medical history, social history, problem list and allergies were reviewed in detail with the patient  Goals were established with regard to weight loss, exercise, and  diet in compliance with medications Wt Readings from Last 3 Encounters:  05/17/21 179 lb (81.2 kg)  03/23/21 179 lb (81.2 kg)  03/16/21 179 lb (81.2 kg)   She is up to date on routine colon cancer screening and mammograms   Review of Systems  Constitutional: Negative.   HENT: Negative.    Eyes: Negative.   Respiratory: Negative.    Cardiovascular: Negative.   Gastrointestinal: Negative.   Endocrine: Negative.   Genitourinary: Negative.   Musculoskeletal: Negative.   Skin: Negative.   Allergic/Immunologic: Negative.   Neurological: Negative.   Hematological: Negative.   Psychiatric/Behavioral: Negative.     Past Medical History:  Diagnosis Date   Allergy    Anemia    past hx   Blood transfusion without reported diagnosis    Diabetes mellitus ([place])    Fatty liver    GERD (gastroesophageal reflux disease)    prn med.   Hiatal hernia 07/29/1996   Hyperlipidemia    Hyperplastic colon polyp    Hypertension    new dx. - will start med. 06/28/2011   Hyperthyroidism    Hypothyroidism    Neuromuscular disorder (HCC)    hemiplegia ,  neuropathy   Rotator cuff tear, right    Sleep apnea    prior to gastric bypass - no sleep study since bypass   Stroke [facility])    TIA 2016    Social History   Socioeconomic History   Marital status: Married    Spouse name: Not on file   Number of children: Not on file   Years of education: Not on file   Highest education level: Not on file  Occupational History   Occupation: unemployed   Tobacco Use   Smoking status: Former    Packs/day: 1.00    Years: 10.00    Pack years: 10.00    Types: Cigarettes    Quit date: 07/29/1984    Years since quitting: 36.8   Smokeless tobacco: Never  Vaping Use   Vaping Use: Never used  Substance and Sexual Activity   Alcohol use: Yes    Comment: occassionally   Drug use: No   Sexual activity: Yes  Other Topics Concern   Not on file  Social History Narrative   Married for 24 years    Six children- all live around here. Two daughters live with her   Six grand children    On Disability for mental issues   No pets   Likes to go to movies and out to dinner. She likes to do puzzle books. Likes to read      She eats a healthy diet   She does not exercise          Social Determinants of Health   Financial Resource Strain: Not on file  Food Insecurity: Not on file  Transportation Needs: Not on file  Physical Activity: Not on file  Stress: Not on file  Social Connections: Not on file  Intimate Partner Violence: Not on file    Past Surgical History:  Procedure Laterality Date   ABDOMINAL HYSTERECTOMY  06/25/2000   left salpingo-oophorectomy   BACK SURGERY  07/30/1995   discetomy, lumbar spinal fusion   CARDIAC CATHETERIZATION  04/01/2006   CESAREAN SECTION  07/29/1989   CHOLECYSTECTOMY  07/30/1987   COLONOSCOPY     EXPLORATORY LAPAROTOMY  05/08/2000   right salpingo-oophorectomy   LAMINOTOMY / EXCISION DISK POSTERIOR CERVICAL SPINE  08/10/2009   C7-T1   POLYPECTOMY     HPP 2012   ROUX-EN-Y GASTRIC BYPASS  05/19/2007   SHOULDER ARTHROSCOPY  01/02/2006; 05/31/2004   left 2007; right 2005   SHOULDER ARTHROSCOPY  07/02/2011   Procedure: ARTHROSCOPY SHOULDER;  Surgeon: Cammie Sickle., MD;  Location: [facility];  Service: Orthopedics;  Laterality: Right;  repair supraspinatus, Debride glenohumeral joint, labrum   SHOULDER ARTHROSCOPY DISTAL CLAVICLE EXCISION AND OPEN ROTATOR CUFF REPAIR  07/26/2010    right   THYROID SURGERY  early 2000s   goiter   TUBAL LIGATION      Family History  Problem Relation Age of Onset   Diabetes Mother    Hyperlipidemia Mother    Hypertension Mother    Thyroid disease Mother    Rheum arthritis Mother    COPD Mother    Cancer Paternal Grandfather        Prostate Cancer   Coronary artery disease Other    Colon cancer Neg Hx    Colon polyps Neg Hx    Esophageal cancer Neg Hx    Rectal cancer Neg Hx    Stomach cancer Neg Hx    Pancreatic cancer Neg Hx     Allergies  Allergen Reactions   Shrimp Flavor Swelling    Swelling of tongue and rash   Ace Inhibitors Cough    Current Outpatient Medications on File Prior to Visit  Medication Sig Dispense Refill   B Complex Vitamins (B COMPLEX PO) Take by mouth.     BIOTIN PO Take by mouth.     bisoprolol-hydrochlorothiazide (ZIAC) 10-6.25 MG tablet TAKE 1 TABLET DAILY 90 tablet 3   Blood Glucose Monitoring Suppl (ONETOUCH VERIO) w/Device KIT 1 Units by Does not apply route daily. 1 kit 0   Cholecalciferol (VITAMIN D3 PO) Take by mouth.     clotrimazole-betamethasone (LOTRISONE) cream Apply 1 application topically daily. Apply thin layer to affected area twice daily 30 g 0   Continuous Blood Gluc Sensor (FREESTYLE LIBRE 14 DAY SENSOR) MISC USE WITH LIBRE APP 6 each 3   ferrous sulfate 325 (65 FE) MG EC tablet Take 325 mg by mouth 3 (three) times daily with meals.     fluticasone (FLONASE) 50 MCG/ACT nasal spray USE 2 SPRAYS IN EACH NOSTRIL DAILY (Patient taking differently: as needed.) 48 g 3   gabapentin (NEURONTIN) 400 MG capsule TAKE 2 CAPSULES THREE TIMES A DAY 540 capsule 1   glipiZIDE (GLUCOTROL XL) 10 MG 24 hr tablet Take 1 tablet (10 mg total) by mouth daily with breakfast. 90 tablet 0   glipiZIDE (GLUCOTROL XL) 5 MG 24 hr tablet TAKE 1 TABLET BY MOUTH EVERY DAY IN THE EVENING 90 tablet 0   HYDROcodone-acetaminophen (NORCO/VICODIN) 5-325 MG tablet Take 1 tablet by mouth 3 (three) times daily.      ibuprofen (ADVIL,MOTRIN) 600 MG tablet Take 1 tablet (600 mg total) by mouth every 8 (eight) hours as needed. 20 tablet 0   levothyroxine (SYNTHROID) 112 MCG tablet TAKE 1 TABLET DAILY 90 tablet 3   lidocaine (XYLOCAINE) 5 % ointment Apply 1 application topically 4 (four) times daily as needed. On bottom of feet B/L 35.44 g 0   loperamide (IMODIUM A-D) 2 MG tablet Take 1 tablet (2 mg total) by mouth 4 (four) times daily as needed for diarrhea or loose stools. 30 tablet 0   meloxicam (MOBIC) 7.5 MG tablet Take 7.5 mg by mouth daily.     metFORMIN (GLUCOPHAGE) 1000 MG tablet TAKE 1 TABLET DAILY WITH BREAKFAST 90 tablet 3   nitroGLYCERIN (NITROSTAT) 0.4 MG SL tablet Place 1 tablet (0.4 mg total) under the tongue every 5 (five) minutes as needed for chest pain (CP or SOB). 60 tablet 12   ONE TOUCH LANCETS MISC Test blood sugars once daily Dx: E11.9 100 each 3   ONETOUCH VERIO test strip USE TO TEST BLOOD GLUCOSE THREE TIMES A DAY 300 strip 3   oxybutynin (DITROPAN-XL) 5 MG 24 hr tablet TAKE 1 TABLET DAILY AT BEDTIME 90 tablet 3   simvastatin (ZOCOR) 20 MG tablet TAKE 1 TABLET AT BEDTIME 90 tablet 3   TRULICITY 3 FM/3.8GY SOPN INJECT 3 MG AS DIRECTED ONCE A WEEK 6 mL 3   VITAMIN E PO Take by mouth.     No current facility-administered medications on file prior to visit.    BP 110/80   Pulse 71   Temp 98 F (36.7 C) (Oral)   Ht 5' 1.8" (1.57 m)   Wt 179 lb (81.2 kg)   SpO2 96%   BMI 32.95 kg/m        Objective:   Physical Exam Vitals and nursing note reviewed.  Constitutional:      General: She  is not in acute distress.    Appearance: Normal appearance. She is well-developed. She is not ill-appearing.  HENT:     Head: Normocephalic and atraumatic.     Right Ear: Tympanic membrane, ear canal and external ear normal. There is no impacted cerumen.     Left Ear: Tympanic membrane, ear canal and external ear normal. There is no impacted cerumen.     Nose: Nose normal. No congestion or  rhinorrhea.     Mouth/Throat:     Mouth: Mucous membranes are moist.     Pharynx: Oropharynx is clear. No oropharyngeal exudate or posterior oropharyngeal erythema.  Eyes:     General:        Right eye: No discharge.        Left eye: No discharge.     Extraocular Movements: Extraocular movements intact.     Conjunctiva/sclera: Conjunctivae normal.     Pupils: Pupils are equal, round, and reactive to light.  Neck:     Thyroid: No thyromegaly.     Vascular: No carotid bruit.     Trachea: No tracheal deviation.  Cardiovascular:     Rate and Rhythm: Normal rate and regular rhythm.     Pulses: Normal pulses.     Heart sounds: Normal heart sounds. No murmur heard.   No friction rub. No gallop.  Pulmonary:     Effort: Pulmonary effort is normal. No respiratory distress.     Breath sounds: Normal breath sounds. No stridor. No wheezing, rhonchi or rales.  Chest:     Chest wall: No tenderness.  Abdominal:     General: Abdomen is flat. Bowel sounds are normal. There is no distension.     Palpations: Abdomen is soft. There is no mass.     Tenderness: There is no abdominal tenderness. There is no right CVA tenderness, left CVA tenderness, guarding or rebound.     Hernia: No hernia is present.  Musculoskeletal:        General: No swelling, tenderness, deformity or signs of injury. Normal range of motion.     Cervical back: Normal range of motion and neck supple.     Right lower leg: No edema.     Left lower leg: No edema.  Lymphadenopathy:     Cervical: No cervical adenopathy.  Skin:    General: Skin is warm and dry.     Coloration: Skin is not jaundiced or pale.     Findings: No bruising, erythema, lesion or rash.  Neurological:     General: No focal deficit present.     Mental Status: She is alert and oriented to person, place, and time.     Cranial Nerves: No cranial nerve deficit.     Sensory: No sensory deficit.     Motor: No weakness.     Coordination: Coordination normal.      Gait: Gait normal.     Deep Tendon Reflexes: Reflexes normal.  Psychiatric:        Mood and Affect: Mood normal.        Behavior: Behavior normal.        Thought Content: Thought content normal.        Judgment: Judgment normal.      Assessment & Plan:  1. Routine general medical examination at a health care facility  - CBC with Differential/Platelet; Future - Comprehensive metabolic panel; Future - Hemoglobin A1c; Future - Lipid panel; Future - TSH; Future - TSH - Lipid panel - Hemoglobin A1c - Comprehensive  metabolic panel - CBC with Differential/Platelet  2. Type 2 diabetes mellitus with diabetic neuropathy, without long-term current use of insulin (HCC) -Advised that she needs to cut back on sugars and carbs in the morning, add more protein as well as eat a small meal in the afternoon to prevent hyper and hypoglycemia episodes - Follow up in three months  - CBC with Differential/Platelet; Future - Comprehensive metabolic panel; Future - Hemoglobin A1c; Future - Lipid panel; Future - TSH; Future - TSH - Lipid panel - Hemoglobin A1c - Comprehensive metabolic panel - CBC with Differential/Platelet  3. Essential hypertension - Controlled. No change in medications  - CBC with Differential/Platelet; Future - Comprehensive metabolic panel; Future - Hemoglobin A1c; Future - Lipid panel; Future - TSH; Future - TSH - Lipid panel - Hemoglobin A1c - Comprehensive metabolic panel - CBC with Differential/Platelet  4. Hypothyroidism, unspecified type - Consider dose change of synthroid  - CBC with Differential/Platelet; Future - Comprehensive metabolic panel; Future - Hemoglobin A1c; Future - Lipid panel; Future - TSH; Future - TSH - Lipid panel - Hemoglobin A1c - Comprehensive metabolic panel - CBC with Differential/Platelet  5. OAB (overactive bladder) - Continue with Ditropan  - CBC with Differential/Platelet; Future - Comprehensive metabolic panel; Future -  Hemoglobin A1c; Future - Lipid panel; Future - TSH; Future - TSH - Lipid panel - Hemoglobin A1c - Comprehensive metabolic panel - CBC with Differential/Platelet  6. Neuropathy - Continue with gabapentin  - CBC with Differential/Platelet; Future - Comprehensive metabolic panel; Future - Hemoglobin A1c; Future - Lipid panel; Future - TSH; Future - TSH - Lipid panel - Hemoglobin A1c - Comprehensive metabolic panel - CBC with Differential/Platelet  7. Mixed hyperlipidemia - Consider increase in statin  - CBC with Differential/Platelet; Future - Comprehensive metabolic panel; Future - Hemoglobin A1c; Future - Lipid panel; Future - TSH; Future - TSH - Lipid panel - Hemoglobin A1c - Comprehensive metabolic panel - CBC with Differential/Platelet  8. Estrogen deficiency  - DG Bone Density; Future  10. Need for immunization against influenza  - Flu Vaccine QUAD 60moIM (Fluarix, Fluzone & Alfiuria Quad PF)  CDorothyann Peng NP

## 2021-05-17 NOTE — Patient Instructions (Addendum)
It was great seeing you today    Please stop drinking pepsi and eating carbs in the morning. This is why you are having spikes and then dropping suddenly. Eat more protein during the meals, especially in the afternoon  I will follow up with you regarding your blood work   Start using Metamucil and drinking more water  Follow up in 2-3 months for second shingles vaccination   Someone will call you to schedule your bone density screen   I will see you back in 3 months

## 2021-05-18 ENCOUNTER — Other Ambulatory Visit: Payer: Self-pay | Admitting: Adult Health

## 2021-05-18 DIAGNOSIS — E1140 Type 2 diabetes mellitus with diabetic neuropathy, unspecified: Secondary | ICD-10-CM

## 2021-05-18 MED ORDER — GLIPIZIDE ER 10 MG PO TB24: 10.0000 mg | ORAL_TABLET | Freq: Every day | ORAL | 0 refills | Status: DC

## 2021-05-28 ENCOUNTER — Other Ambulatory Visit: Payer: Self-pay | Admitting: Adult Health

## 2021-05-28 DIAGNOSIS — E1140 Type 2 diabetes mellitus with diabetic neuropathy, unspecified: Secondary | ICD-10-CM

## 2021-05-28 DIAGNOSIS — E7800 Pure hypercholesterolemia, unspecified: Secondary | ICD-10-CM

## 2021-05-29 ENCOUNTER — Telehealth: Payer: Self-pay | Admitting: Adult Health

## 2021-05-29 ENCOUNTER — Encounter: Payer: Self-pay | Admitting: Adult Health

## 2021-05-29 DIAGNOSIS — G4730 Sleep apnea, unspecified: Secondary | ICD-10-CM

## 2021-05-29 NOTE — Telephone Encounter (Signed)
Pt is calling and has sent mychart message to cory. Pt would like to have a referral to [facility] neurologist for sleep apnea

## 2021-05-29 NOTE — Telephone Encounter (Signed)
Okay for referral?

## 2021-05-30 NOTE — Telephone Encounter (Signed)
Patient notified of update  and verbalized understanding. 

## 2021-06-01 ENCOUNTER — Other Ambulatory Visit: Payer: Self-pay | Admitting: Adult Health

## 2021-06-01 DIAGNOSIS — E2839 Other primary ovarian failure: Secondary | ICD-10-CM

## 2021-07-08 ENCOUNTER — Other Ambulatory Visit: Payer: Self-pay | Admitting: Adult Health

## 2021-07-08 DIAGNOSIS — E1140 Type 2 diabetes mellitus with diabetic neuropathy, unspecified: Secondary | ICD-10-CM

## 2021-08-02 ENCOUNTER — Ambulatory Visit (INDEPENDENT_AMBULATORY_CARE_PROVIDER_SITE_OTHER): Payer: Medicare Other | Admitting: Adult Health

## 2021-08-02 ENCOUNTER — Encounter: Payer: Self-pay | Admitting: Adult Health

## 2021-08-02 ENCOUNTER — Other Ambulatory Visit: Payer: Self-pay | Admitting: Adult Health

## 2021-08-02 VITALS — BP 120/80 | HR 91 | Temp 98.1°F | Ht 61.8 in | Wt 183.0 lb

## 2021-08-02 DIAGNOSIS — F259 Schizoaffective disorder, unspecified: Secondary | ICD-10-CM

## 2021-08-02 DIAGNOSIS — E7800 Pure hypercholesterolemia, unspecified: Secondary | ICD-10-CM

## 2021-08-02 DIAGNOSIS — E039 Hypothyroidism, unspecified: Secondary | ICD-10-CM | POA: Diagnosis not present

## 2021-08-02 DIAGNOSIS — I10 Essential (primary) hypertension: Secondary | ICD-10-CM | POA: Diagnosis not present

## 2021-08-02 DIAGNOSIS — E1140 Type 2 diabetes mellitus with diabetic neuropathy, unspecified: Secondary | ICD-10-CM | POA: Diagnosis not present

## 2021-08-02 DIAGNOSIS — M792 Neuralgia and neuritis, unspecified: Secondary | ICD-10-CM | POA: Diagnosis not present

## 2021-08-02 MED ORDER — TRULICITY 3 MG/0.5ML ~~LOC~~ SOAJ: 3.0000 mg | SUBCUTANEOUS | 3 refills | Status: DC

## 2021-08-02 MED ORDER — BISOPROLOL-HYDROCHLOROTHIAZIDE 10-6.25 MG PO TABS: 1.0000 | ORAL_TABLET | Freq: Every day | ORAL | 3 refills | Status: DC

## 2021-08-02 MED ORDER — METFORMIN HCL 1000 MG PO TABS: 1000.0000 mg | ORAL_TABLET | Freq: Every day | ORAL | 0 refills | Status: DC

## 2021-08-02 MED ORDER — LEVOTHYROXINE SODIUM 112 MCG PO TABS: 112.0000 ug | ORAL_TABLET | Freq: Every day | ORAL | 3 refills | Status: DC

## 2021-08-02 MED ORDER — GABAPENTIN 400 MG PO CAPS: ORAL_CAPSULE | ORAL | 3 refills | Status: DC

## 2021-08-02 MED ORDER — GLIPIZIDE ER 10 MG PO TB24: 10.0000 mg | ORAL_TABLET | Freq: Every day | ORAL | 0 refills | Status: DC

## 2021-08-02 MED ORDER — FREESTYLE LIBRE 14 DAY SENSOR MISC: 3 refills | Status: DC

## 2021-08-02 MED ORDER — OXYBUTYNIN CHLORIDE ER 5 MG PO TB24: 5.0000 mg | ORAL_TABLET | Freq: Every day | ORAL | 3 refills | Status: DC

## 2021-08-02 MED ORDER — SIMVASTATIN 20 MG PO TABS: 20.0000 mg | ORAL_TABLET | Freq: Every day | ORAL | 3 refills | Status: DC

## 2021-08-02 MED ORDER — PANTOPRAZOLE SODIUM 40 MG PO TBEC: 40.0000 mg | DELAYED_RELEASE_TABLET | Freq: Every day | ORAL | 3 refills | Status: DC

## 2021-08-02 MED ORDER — GLIPIZIDE ER 5 MG PO TB24: ORAL_TABLET | ORAL | 0 refills | Status: DC

## 2021-08-02 NOTE — Patient Instructions (Signed)
It was great seeing you today   I have sent in all your medications and made your referrals

## 2021-08-02 NOTE — Progress Notes (Signed)
Subjective:    Patient ID: Alexandra [name], female    DOB: 1960-09-03, 61 y.o.   MRN: [id number]  HPI 61 year old female who  has a past medical history of Allergy, Anemia, Blood transfusion without reported diagnosis, Diabetes mellitus ([place]), Fatty liver, GERD (gastroesophageal reflux disease), Hiatal hernia (07/29/1996), Hyperlipidemia, Hyperplastic colon polyp, Hypertension, Hyperthyroidism, Hypothyroidism, Neuromuscular disorder ([place]), Rotator cuff tear, right, Sleep apnea, and Stroke ([place]).  She recently changed insurances and is on [employer]. She needs new referrals to her specialists and medications sent to mail order pharmacy.    Review of Systems See HPI   Past Medical History:  Diagnosis Date   Allergy    Anemia    past hx   Blood transfusion without reported diagnosis    Diabetes mellitus ([place])    Fatty liver    GERD (gastroesophageal reflux disease)    prn med.   Hiatal hernia 07/29/1996   Hyperlipidemia    Hyperplastic colon polyp    Hypertension    new dx. - will start med. 06/28/2011   Hyperthyroidism    Hypothyroidism    Neuromuscular disorder (HCC)    hemiplegia , neuropathy   Rotator cuff tear, right    Sleep apnea    prior to gastric bypass - no sleep study since bypass   Stroke [facility])    TIA 2016    Social History   Socioeconomic History   Marital status: Married    Spouse name: Not on file   Number of children: Not on file   Years of education: Not on file   Highest education level: Not on file  Occupational History   Occupation: unemployed  Tobacco Use   Smoking status: Former    Packs/day: 1.00    Years: 10.00    Pack years: 10.00    Types: Cigarettes    Quit date: 07/29/1984    Years since quitting: 37.0   Smokeless tobacco: Never  Vaping Use   Vaping Use: Never used  Substance and Sexual Activity   Alcohol use: Yes    Comment: occassionally   Drug use: No   Sexual activity: Yes  Other Topics Concern   Not  on file  Social History Narrative   Married for 24 years    Six children- all live around here. Two daughters live with her   Six grand children    On Disability for mental issues   No pets   Likes to go to movies and out to dinner. She likes to do puzzle books. Likes to read      She eats a healthy diet   She does not exercise          Social Determinants of Health   Financial Resource Strain: Not on file  Food Insecurity: Not on file  Transportation Needs: Not on file  Physical Activity: Not on file  Stress: Not on file  Social Connections: Not on file  Intimate Partner Violence: Not on file    Past Surgical History:  Procedure Laterality Date   ABDOMINAL HYSTERECTOMY  06/25/2000   left salpingo-oophorectomy   BACK SURGERY  07/30/1995   discetomy, lumbar spinal fusion   CARDIAC CATHETERIZATION  04/01/2006   CESAREAN SECTION  07/29/1989   CHOLECYSTECTOMY  07/30/1987   COLONOSCOPY     EXPLORATORY LAPAROTOMY  05/08/2000   right salpingo-oophorectomy   LAMINOTOMY / EXCISION DISK POSTERIOR CERVICAL SPINE  08/10/2009   C7-T1   POLYPECTOMY  [facility] 2012   ROUX-EN-Y GASTRIC BYPASS  05/19/2007   SHOULDER ARTHROSCOPY  01/02/2006; 05/31/2004   left 2007; right 2005   SHOULDER ARTHROSCOPY  07/02/2011   Procedure: ARTHROSCOPY SHOULDER;  Surgeon: Cammie Sickle., MD;  Location: [facility];  Service: Orthopedics;  Laterality: Right;  repair supraspinatus, Debride glenohumeral joint, labrum   SHOULDER ARTHROSCOPY DISTAL CLAVICLE EXCISION AND OPEN ROTATOR CUFF REPAIR  07/26/2010   right   THYROID SURGERY  early 2000s   goiter   TUBAL LIGATION      Family History  Problem Relation Age of Onset   Diabetes Mother    Hyperlipidemia Mother    Hypertension Mother    Thyroid disease Mother    Rheum arthritis Mother    COPD Mother    Cancer Paternal Grandfather        Prostate Cancer   Coronary artery disease Other    Colon cancer Neg Hx    Colon polyps Neg Hx     Esophageal cancer Neg Hx    Rectal cancer Neg Hx    Stomach cancer Neg Hx    Pancreatic cancer Neg Hx     Allergies  Allergen Reactions   Shrimp Flavor Swelling    Swelling of tongue and rash   Ace Inhibitors Cough    Current Outpatient Medications on File Prior to Visit  Medication Sig Dispense Refill   B Complex Vitamins (B COMPLEX PO) Take by mouth.     BIOTIN PO Take by mouth.     bisoprolol-hydrochlorothiazide (ZIAC) 10-6.25 MG tablet TAKE 1 TABLET DAILY 90 tablet 3   Blood Glucose Monitoring Suppl (ONETOUCH VERIO) w/Device KIT 1 Units by Does not apply route daily. 1 kit 0   Cholecalciferol (VITAMIN D3 PO) Take by mouth.     clotrimazole-betamethasone (LOTRISONE) cream Apply 1 application topically daily. Apply thin layer to affected area twice daily 30 g 0   Continuous Blood Gluc Sensor (FREESTYLE LIBRE 14 DAY SENSOR) MISC USE WITH LIBRE APP 6 each 3   ferrous sulfate 325 (65 FE) MG EC tablet Take 325 mg by mouth 3 (three) times daily with meals.     fluticasone (FLONASE) 50 MCG/ACT nasal spray USE 2 SPRAYS IN EACH NOSTRIL DAILY (Patient taking differently: as needed.) 48 g 3   gabapentin (NEURONTIN) 400 MG capsule TAKE 2 CAPSULES THREE TIMES A DAY 540 capsule 3   glipiZIDE (GLUCOTROL XL) 10 MG 24 hr tablet Take 1 tablet (10 mg total) by mouth daily with breakfast. 90 tablet 0   glipiZIDE (GLUCOTROL XL) 5 MG 24 hr tablet TAKE 1 TABLET BY MOUTH EVERY DAY IN THE EVENING 90 tablet 0   HYDROcodone-acetaminophen (NORCO/VICODIN) 5-325 MG tablet Take 1 tablet by mouth 3 (three) times daily.     ibuprofen (ADVIL,MOTRIN) 600 MG tablet Take 1 tablet (600 mg total) by mouth every 8 (eight) hours as needed. 20 tablet 0   levothyroxine (SYNTHROID) 112 MCG tablet TAKE 1 TABLET DAILY 90 tablet 3   lidocaine (XYLOCAINE) 5 % ointment Apply 1 application topically 4 (four) times daily as needed. On bottom of feet B/L 35.44 g 0   loperamide (IMODIUM A-D) 2 MG tablet Take 1 tablet (2 mg total)  by mouth 4 (four) times daily as needed for diarrhea or loose stools. 30 tablet 0   meloxicam (MOBIC) 7.5 MG tablet Take 7.5 mg by mouth daily.     metFORMIN (GLUCOPHAGE) 1000 MG tablet TAKE 1 TABLET DAILY WITH BREAKFAST 90 tablet  3   nitroGLYCERIN (NITROSTAT) 0.4 MG SL tablet Place 1 tablet (0.4 mg total) under the tongue every 5 (five) minutes as needed for chest pain (CP or SOB). 60 tablet 12   ONE TOUCH LANCETS MISC Test blood sugars once daily Dx: E11.9 100 each 3   ONETOUCH VERIO test strip USE TO TEST BLOOD GLUCOSE THREE TIMES A DAY 300 strip 3   oxybutynin (DITROPAN-XL) 5 MG 24 hr tablet TAKE 1 TABLET DAILY AT BEDTIME 90 tablet 3   simvastatin (ZOCOR) 20 MG tablet TAKE 1 TABLET AT BEDTIME 90 tablet 3   TRULICITY 3 HM/0.9OB SOPN INJECT 3 MG AS DIRECTED ONCE A WEEK 6 mL 3   VITAMIN E PO Take by mouth.     No current facility-administered medications on file prior to visit.    BP 120/80    Pulse 91    Temp 98.1 F (36.7 C) (Oral)    Ht 5' 1.8" (1.57 m)    Wt 183 lb (83 kg)    SpO2 96%    BMI 33.69 kg/m       Objective:   Physical Exam Vitals and nursing note reviewed.  Constitutional:      Appearance: Normal appearance.  Musculoskeletal:        General: Normal range of motion.  Skin:    General: Skin is warm and dry.     Capillary Refill: Capillary refill takes less than 2 seconds.  Neurological:     General: No focal deficit present.     Mental Status: She is alert and oriented to person, place, and time.  Psychiatric:        Mood and Affect: Mood normal.        Behavior: Behavior normal.        Thought Content: Thought content normal.        Judgment: Judgment normal.      Assessment & Plan:  1. Neuropathic pain  - Ambulatory referral to Pain Clinic - gabapentin (NEURONTIN) 400 MG capsule; TAKE 2 CAPSULES THREE TIMES A DAY  Dispense: 540 capsule; Refill: 3  2. Schizoaffective disorder, unspecified type ([place])  - Ambulatory referral to Psychiatry  3. Pure  hypercholesterolemia  - simvastatin (ZOCOR) 20 MG tablet; Take 1 tablet (20 mg total) by mouth at bedtime.  Dispense: 90 tablet; Refill: 3  4. Type 2 diabetes mellitus with diabetic neuropathy, without long-term current use of insulin (HCC)  - Continuous Blood Gluc Sensor (FREESTYLE LIBRE 14 DAY SENSOR) MISC; Use with smart phoen  Dispense: 6 each; Refill: 3 - metFORMIN (GLUCOPHAGE) 1000 MG tablet; Take 1 tablet (1,000 mg total) by mouth daily with breakfast.  Dispense: 90 tablet; Refill: 0 - glipiZIDE (GLUCOTROL XL) 5 MG 24 hr tablet; TAKE 1 TABLET BY MOUTH EVERY DAY IN THE EVENING  Dispense: 90 tablet; Refill: 0 - glipiZIDE (GLUCOTROL XL) 10 MG 24 hr tablet; Take 1 tablet (10 mg total) by mouth daily with breakfast.  Dispense: 90 tablet; Refill: 0 - gabapentin (NEURONTIN) 400 MG capsule; TAKE 2 CAPSULES THREE TIMES A DAY  Dispense: 540 capsule; Refill: 3  5. Hypothyroidism, unspecified type  - levothyroxine (SYNTHROID) 112 MCG tablet; Take 1 tablet (112 mcg total) by mouth daily.  Dispense: 90 tablet; Refill: 3  6. Essential hypertension  - bisoprolol-hydrochlorothiazide (ZIAC) 10-6.25 MG tablet; Take 1 tablet by mouth daily.  Dispense: 90 tablet; Refill: 3  Dorothyann Peng, NP

## 2021-08-03 ENCOUNTER — Encounter: Payer: Self-pay | Admitting: Adult Health

## 2021-08-03 NOTE — Telephone Encounter (Signed)
Is there a way to change this?

## 2021-08-06 ENCOUNTER — Encounter: Payer: Self-pay | Admitting: Adult Health

## 2021-08-07 MED ORDER — TRULICITY 3 MG/0.5ML ~~LOC~~ SOAJ: 3.0000 mg | SUBCUTANEOUS | 3 refills | Status: DC

## 2021-08-09 ENCOUNTER — Encounter: Payer: Self-pay | Admitting: Adult Health

## 2021-08-09 ENCOUNTER — Other Ambulatory Visit: Payer: Self-pay | Admitting: Adult Health

## 2021-08-09 NOTE — Telephone Encounter (Signed)
PA started Pt notified that her strips were sent in 08/02/2021.  Pt verbalized understanding.    Baudelia Bolen KeyLeanord Hawking - PA Case ID: [id number] Need help? Call us at [phone] Status Sent to [place] 14 Day Sensor Form [employer] Medicare Part D Electronic Prior Authorization Form [phone] NCPDP)

## 2021-08-10 ENCOUNTER — Other Ambulatory Visit: Payer: Self-pay | Admitting: Adult Health

## 2021-08-16 ENCOUNTER — Ambulatory Visit: Payer: Medicare Other | Admitting: Neurology

## 2021-08-16 ENCOUNTER — Encounter: Payer: Self-pay | Admitting: Neurology

## 2021-08-16 VITALS — BP 127/82 | HR 81 | Ht 61.0 in | Wt 180.0 lb

## 2021-08-16 DIAGNOSIS — E669 Obesity, unspecified: Secondary | ICD-10-CM

## 2021-08-16 DIAGNOSIS — G4719 Other hypersomnia: Secondary | ICD-10-CM | POA: Diagnosis not present

## 2021-08-16 DIAGNOSIS — R351 Nocturia: Secondary | ICD-10-CM

## 2021-08-16 DIAGNOSIS — R519 Headache, unspecified: Secondary | ICD-10-CM

## 2021-08-16 DIAGNOSIS — G4733 Obstructive sleep apnea (adult) (pediatric): Secondary | ICD-10-CM

## 2021-08-16 NOTE — Progress Notes (Signed)
Subjective:    Patient ID: Alexandra [name] is a 61 y.o. female.  HPI    Alexandra Age, MD, PhD [facility] [address], [address] P.O. [place], [place] [address]  Dear Alexandra [name],   I saw your patient, Alexandra [name], upon your kind request in my sleep clinic today for initial consultation of her sleep disorder, in particular, evaluation of her prior diagnosis of sleep apnea.  The patient is unaccompanied today.  As you know, Alexandra [name] is a 61 year old right-handed woman with an underlying medical history of allergies, anemia, history of TIA, diabetes, reflux disease, hyperlipidemia, hypertension, thyroid disease, neuropathy, history of, obesity with status post gastric bypass in 2008, arthritis with status post neck surgery, status post shoulder surgeries, who reports snoring and excessive daytime somnolence.  She was diagnosed with sleep apnea in 2015.  I was able to review her sleep study report from 06/12/2016.  She had a baseline sleep study through the [facility] sleep center.  Sleep efficiency was 94.8%, sleep latency was 0.4 minutes, REM latency was 114.5 minutes.  Total AHI was 5.9/h, O2 nadir 87%.  She tried positive airway pressure treatment but could not tolerate it at the time.  I reviewed your office note from 08/02/2021 as well as 05/17/2021.  Her Epworth sleepiness score is 17 out of 24, fatigue severity score is 45 out of 63.  She reports not sleeping well.  Her difficulty with CPAP was primarily her fullface mask.  She would like to see if she would be a candidate for inspire.  We talked about it at length today.  She goes to bed around 11 and rise time is between 7 and 7:30 AM.  She lives with her husband and 12 year old granddaughter.  She does not work.  She quit smoking in 1986, she drinks alcohol occasionally on the weekends, she drinks caffeine in the form of soda, 1/day on average.  Of note, she is on fairly high dose of gabapentin, 800 mg 3  times daily.  She also takes narcotic pain medication, hydrocodone 5 mg 3 times daily for right leg pain, she is followed by [facility] pain management.  She has nocturia about once or twice per average night and has had occasional morning headaches.  Her Past Medical History Is Significant For: Past Medical History:  Diagnosis Date   Allergy    Anemia    past hx   Blood transfusion without reported diagnosis    Diabetes mellitus ([place])    Fatty liver    GERD (gastroesophageal reflux disease)    prn med.   Hiatal hernia 07/29/1996   Hyperlipidemia    Hyperplastic colon polyp    Hypertension    new dx. - will start med. 06/28/2011   Hyperthyroidism    Hypothyroidism    Neuromuscular disorder (HCC)    hemiplegia , neuropathy   Rotator cuff tear, right    Sleep apnea    prior to gastric bypass - no sleep study since bypass   Stroke [facility])    TIA 2016    Her Past Surgical History Is Significant For: Past Surgical History:  Procedure Laterality Date   ABDOMINAL HYSTERECTOMY  06/25/2000   left salpingo-oophorectomy   BACK SURGERY  07/30/1995   discetomy, lumbar spinal fusion   CARDIAC CATHETERIZATION  04/01/2006   CESAREAN SECTION  07/29/1989   CHOLECYSTECTOMY  07/30/1987   COLONOSCOPY     EXPLORATORY LAPAROTOMY  05/08/2000   right salpingo-oophorectomy  LAMINOTOMY / EXCISION DISK POSTERIOR CERVICAL SPINE  08/10/2009   C7-T1   MENISCUS REPAIR Right 04/2021   POLYPECTOMY     [facility] 2012   ROUX-EN-Y GASTRIC BYPASS  05/19/2007   SHOULDER ARTHROSCOPY  01/02/2006; 05/31/2004   left 2007; right 2005   SHOULDER ARTHROSCOPY  07/02/2011   Procedure: ARTHROSCOPY SHOULDER;  Surgeon: Cammie Sickle., MD;  Location: [facility];  Service: Orthopedics;  Laterality: Right;  repair supraspinatus, Debride glenohumeral joint, labrum   SHOULDER ARTHROSCOPY DISTAL CLAVICLE EXCISION AND OPEN ROTATOR CUFF REPAIR  07/26/2010   right   THYROID SURGERY  early 2000s   goiter    TUBAL LIGATION      Her Family History Is Significant For: Family History  Problem Relation [name] of Onset   Diabetes Mother    Hyperlipidemia Mother    Hypertension Mother    Thyroid disease Mother    Rheum arthritis Mother    COPD Mother    Cancer Paternal Grandfather        Prostate Cancer   Coronary artery disease Other    Colon cancer Neg Hx    Colon polyps Neg Hx    Esophageal cancer Neg Hx    Rectal cancer Neg Hx    Stomach cancer Neg Hx    Pancreatic cancer Neg Hx    Sleep apnea Neg Hx     Her Social History Is Significant For: Social History   Socioeconomic History   Marital status: Married    Spouse name: Not on file   Number of children: Not on file   Years of education: Not on file   Highest education level: Not on file  Occupational History   Occupation: unemployed  Tobacco Use   Smoking status: Former    Packs/day: 1.00    Years: 10.00    Pack years: 10.00    Types: Cigarettes    Quit date: 07/29/1984    Years since quitting: 37.0   Smokeless tobacco: Never  Vaping Use   Vaping Use: Never used  Substance and Sexual Activity   Alcohol use: Yes    Comment: occassionally   Drug use: No   Sexual activity: Yes  Other Topics Concern   Not on file  Social History Narrative   Married for 32 years    Six children- all live around here. Two daughters live with her   63 grand children    On Disability for mental issues   No pets   Likes to go to movies and out to dinner. She likes to do puzzle books. Likes to read      She eats a healthy diet   She does not exercise    Lives at home with husband and granddaughter   Caffeine: 4 cups/day   Social Determinants of Health   Financial Resource Strain: Not on file  Food Insecurity: Not on file  Transportation Needs: Not on file  Physical Activity: Not on file  Stress: Not on file  Social Connections: Not on file    Her Allergies Are:  Allergies  Allergen Reactions   Shrimp Flavor Swelling     Swelling of tongue and rash   Ace Inhibitors Cough  :   Her Current Medications Are:  Outpatient Encounter Medications as of 08/16/2021  Medication Sig   B Complex Vitamins (B COMPLEX PO) Take by mouth.   BIOTIN PO Take by mouth.   bisoprolol-hydrochlorothiazide (ZIAC) 10-6.25 MG tablet Take 1 tablet by mouth daily.  Blood Glucose Monitoring Suppl (ONETOUCH VERIO) w/Device KIT 1 Units by Does not apply route daily.   Cholecalciferol (VITAMIN D3 PO) Take by mouth.   clotrimazole-betamethasone (LOTRISONE) cream Apply 1 application topically daily. Apply thin layer to affected area twice daily (Patient taking differently: Apply 1 application topically daily. Apply thin layer to affected area twice daily; as needed)   Continuous Blood Gluc Sensor (FREESTYLE LIBRE 14 DAY SENSOR) MISC Use with smart phoen   ferrous sulfate 325 (65 FE) MG EC tablet Take 325 mg by mouth 3 (three) times daily with meals.   fluticasone (FLONASE) 50 MCG/ACT nasal spray USE 2 SPRAYS IN EACH NOSTRIL DAILY (Patient taking differently: as needed.)   gabapentin (NEURONTIN) 400 MG capsule TAKE 2 CAPSULES THREE TIMES A DAY   glipiZIDE (GLUCOTROL XL) 10 MG 24 hr tablet Take 1 tablet (10 mg total) by mouth daily with breakfast.   glipiZIDE (GLUCOTROL XL) 5 MG 24 hr tablet TAKE 1 TABLET BY MOUTH EVERY DAY IN THE EVENING   HYDROcodone-acetaminophen (NORCO/VICODIN) 5-325 MG tablet Take 1 tablet by mouth 3 (three) times daily.   ibuprofen (ADVIL) 200 MG tablet Take 400 mg by mouth as needed.   levothyroxine (SYNTHROID) 112 MCG tablet Take 1 tablet (112 mcg total) by mouth daily.   lidocaine (XYLOCAINE) 5 % ointment Apply 1 application topically 4 (four) times daily as needed. On bottom of feet B/L   meloxicam (MOBIC) 7.5 MG tablet Take 7.5 mg by mouth daily.   metFORMIN (GLUCOPHAGE) 1000 MG tablet Take 1 tablet (1,000 mg total) by mouth daily with breakfast.   nitroGLYCERIN (NITROSTAT) 0.4 MG SL tablet Place 1 tablet (0.4 mg total)  under the tongue every 5 (five) minutes as needed for chest pain (CP or SOB).   ONE TOUCH LANCETS MISC Test blood sugars once daily Dx: E11.9   ONETOUCH VERIO test strip USE TO TEST BLOOD GLUCOSE THREE TIMES A DAY   oxybutynin (DITROPAN-XL) 5 MG 24 hr tablet Take 1 tablet (5 mg total) by mouth at bedtime.   pantoprazole (PROTONIX) 40 MG tablet Take 1 tablet (40 mg total) by mouth daily.   simvastatin (ZOCOR) 20 MG tablet Take 1 tablet (20 mg total) by mouth at bedtime.   TRULICITY 3 JE/5.6DJ SOPN INJECT 3 MG AS DIRECTED ONCE A WEEK.   VITAMIN E PO Take by mouth.   loperamide (IMODIUM A-D) 2 MG tablet Take 1 tablet (2 mg total) by mouth 4 (four) times daily as needed for diarrhea or loose stools. (Patient not taking: Reported on 08/16/2021)   [DISCONTINUED] ibuprofen (ADVIL,MOTRIN) 600 MG tablet Take 1 tablet (600 mg total) by mouth every 8 (eight) hours as needed.   No facility-administered encounter medications on file as of 08/16/2021.  :   Review of Systems:  Out of a complete 14 point review of systems, all are reviewed and negative with the exception of these symptoms as listed below:   Review of Systems  Neurological:        Patient is here for sleep consult. She had a sleep study in 2017. Pt states she tried CPAP but slept worse with it. Patient endorses snoring, daytime sleepiness, difficulty sleeping, and headaches. She even tried an adjustable bed but it doesn't help. ESS 17, FSS 45.    Objective:  Neurological Exam  Physical Exam Physical Examination:   Vitals:   08/16/21 1020  BP: 127/82  Pulse: 81    General Examination: The patient is a very pleasant 61 y.o. female  in no acute distress. She appears well-developed and well-nourished and well groomed.   HEENT: Normocephalic, atraumatic, pupils are equal, round and reactive to light, extraocular tracking is good without limitation to gaze excursion or nystagmus noted.  Mild right eyelid ptosis, mild left eye proptosis.   Prominent soft tissue anterior neck, she is status post thyroidectomy.  Hearing is grossly intact. Face is symmetric with normal facial animation. Speech is clear with no dysarthria noted. There is no hypophonia. There is no lip, neck/head, jaw or voice tremor. Neck is supple with full range of passive and active motion. There are no carotid bruits on auscultation. Oropharynx exam reveals: mild mouth dryness, adequate dental hygiene and moderate airway crowding.  Mallampati class III crowding, mild to moderate overbite.  Tongue protrudes centrally and palate elevates symmetrically.  Neck circumference of 17 inches.  Chest: Clear to auscultation without wheezing, rhonchi or crackles noted.  Heart: S1+S2+0, regular and normal without murmurs, rubs or gallops noted.   Abdomen: Soft, non-tender and non-distended with normal bowel sounds appreciated on auscultation.  Extremities: There is no pitting edema in the distal lower extremities bilaterally.   Skin: Warm and dry without trophic changes noted.   Musculoskeletal: exam reveals no obvious joint deformities, tenderness or joint swelling or erythema.   Neurologically:  Mental status: The patient is awake, alert and oriented in all 4 spheres. Her immediate and remote memory, attention, language skills and fund of knowledge are appropriate. There is no evidence of aphasia, agnosia, apraxia or anomia. Speech is clear with normal prosody and enunciation. Thought process is linear. Mood is normal and affect is normal.  Cranial nerves II - XII are as described above under HEENT exam.  Motor exam: Normal bulk, strength and tone is noted. There is no tremor. Fine motor skills and coordination: grossly intact.  Cerebellar testing: No dysmetria or intention tremor. There is no truncal or gait ataxia.  Sensory exam: intact to light touch in the upper and lower extremities.  Gait, station and balance: She stands easily. No veering to one side is noted. No  leaning to one side is noted. Posture is [name]-appropriate and stance is narrow based. Gait shows normal stride length and normal pace. No problems turning are noted.   Assessment and Plan:  In summary, Arantza Tabby Beaston is a very pleasant 61 y.o.-year old female with an underlying medical history of allergies, anemia, history of TIA, diabetes, reflux disease, hyperlipidemia, hypertension, thyroid disease, neuropathy, history of, obesity with status post gastric bypass in 2008, arthritis with status post neck surgery, status post shoulder surgeries, who presents for evaluation of her obstructive sleep apnea.  She was diagnosed with mild obstructive sleep apnea in 2017.  Her weight has remained fairly stable.  She is amenable to reevaluation, she endorses significant daytime somnolence, she was advised, however, that her sleepiness may in part be secondary to medication effect from her narcotic pain medication as well as higher dose gabapentin.  She is advised to continue to work on weight loss.  We talked about the diagnosis of OSA, its prognosis and treatment options. We talked about medical treatments, surgical interventions and non-pharmacological approaches. I explained in particular the risks and ramifications of untreated moderate to severe OSA, especially with respect to developing cardiovascular disease down the Road, including congestive heart failure, difficult to treat hypertension, cardiac arrhythmias, or stroke. Even type 2 diabetes has, in part, been linked to untreated OSA. Symptoms of untreated OSA include daytime sleepiness, memory problems, mood irritability  and mood disorder such as depression and anxiety, lack of energy, as well as recurrent headaches, especially morning headaches.  She is agreeable to reevaluation with a sleep study.  I outlined the differences between a laboratory attended sleep study versus home sleep test.  She is advised not to drive when feeling sleepy. I explained  the sleep test procedure to the patient and also outlined possible surgical and non-surgical treatment options of OSA, including the use of a custom-made dental device (which would require a referral to a specialist dentist or oral surgeon), upper airway surgical options, such as traditional UPPP or a novel less invasive surgical option in the form of Inspire hypoglossal nerve stimulation (which would involve a referral to an ENT surgeon). I also explained the CPAP treatment option to the patient, who indicated that she would be willing to try PAP again if the need arises. I explained the importance of being compliant with PAP treatment, not only for insurance purposes but primarily to improve Her symptoms, and for the patient's long term health benefit, including to reduce Her cardiovascular risks.  Also discussed inspire criteria.  If she has mild sleep apnea, she would not be a candidate for inspire but may be a candidate for a dental device.  We will pick up our discussion about treatment options after testing.  We will keep her posted as to her test results by phone call in the interim.  I answered all her questions today and she was in agreement. Thank you very much for allowing me to participate in the care of this nice patient. If I can be of any further assistance to you please do not hesitate to call me at [phone].  Sincerely,   Alexandra Age, MD, PhD

## 2021-08-17 ENCOUNTER — Encounter: Payer: Self-pay | Admitting: Adult Health

## 2021-08-17 ENCOUNTER — Ambulatory Visit (INDEPENDENT_AMBULATORY_CARE_PROVIDER_SITE_OTHER): Payer: Medicare Other | Admitting: Adult Health

## 2021-08-17 VITALS — BP 124/72 | HR 83 | Temp 98.0°F | Ht 61.0 in | Wt 178.2 lb

## 2021-08-17 DIAGNOSIS — E1140 Type 2 diabetes mellitus with diabetic neuropathy, unspecified: Secondary | ICD-10-CM | POA: Diagnosis not present

## 2021-08-17 DIAGNOSIS — I10 Essential (primary) hypertension: Secondary | ICD-10-CM | POA: Diagnosis not present

## 2021-08-17 LAB — POCT GLYCOSYLATED HEMOGLOBIN (HGB A1C): Hemoglobin A1C: 5.9 % — AB (ref 4.0–5.6)

## 2021-08-17 MED ORDER — ONETOUCH VERIO VI STRP: ORAL_STRIP | 3 refills | Status: DC

## 2021-08-17 NOTE — Progress Notes (Signed)
Subjective:    Patient ID: Alexandra [name], female    DOB: 10-Aug-1960, 61 y.o.   MRN: [id number]  HPI 61 year old female who  has a past medical history of Allergy, Anemia, Blood transfusion without reported diagnosis, Diabetes mellitus ([place]), Fatty liver, GERD (gastroesophageal reflux disease), Hiatal hernia (07/29/1996), Hyperlipidemia, Hyperplastic colon polyp, Hypertension, Hyperthyroidism, Hypothyroidism, Neuromuscular disorder ([place]), Rotator cuff tear, right, Sleep apnea, and Stroke ([place]).  Diabetes mellitus-maintained on glipizide 10 mg extended release in the morning and 5 mg in the evening, metformin 1000 mg twice daily, and Trulicity 3 mg weekly.  Does monitor her blood sugars at home with the [place] system but has been out of her sensors, will hopefully get her sensors this weekend.   She has been eating much better and has cut back on sodas, has had three pepsi's in the last two weeks.   Lab Results  Component Value Date   HGBA1C 6.7 (H) 05/17/2021   HTN -prescribed Ziac 10-6.25 mg.  This has managed her blood pressure very well.  Denies episodes of dizziness, lightheadedness, chest pain, shortness of breath BP Readings from Last 3 Encounters:  08/17/21 124/72  08/16/21 127/82  08/02/21 120/80   Review of Systems See HPI   Past Medical History:  Diagnosis Date   Allergy    Anemia    past hx   Blood transfusion without reported diagnosis    Diabetes mellitus ([place])    Fatty liver    GERD (gastroesophageal reflux disease)    prn med.   Hiatal hernia 07/29/1996   Hyperlipidemia    Hyperplastic colon polyp    Hypertension    new dx. - will start med. 06/28/2011   Hyperthyroidism    Hypothyroidism    Neuromuscular disorder (HCC)    hemiplegia , neuropathy   Rotator cuff tear, right    Sleep apnea    prior to gastric bypass - no sleep study since bypass   Stroke [facility])    TIA 2016    Social History   Socioeconomic History   Marital status: Married     Spouse name: Not on file   Number of children: Not on file   Years of education: Not on file   Highest education level: Bachelor's degree (e.g., BA, AB, BS)  Occupational History   Occupation: unemployed  Tobacco Use   Smoking status: Former    Packs/day: 1.00    Years: 10.00    Pack years: 10.00    Types: Cigarettes    Quit date: 07/29/1984    Years since quitting: 37.0   Smokeless tobacco: Never  Vaping Use   Vaping Use: Never used  Substance and Sexual Activity   Alcohol use: Yes    Comment: occassionally   Drug use: No   Sexual activity: Yes  Other Topics Concern   Not on file  Social History Narrative   Married for 32 years    Six children- all live around here. Two daughters live with her   16 grand children    On Disability for mental issues   No pets   Likes to go to movies and out to dinner. She likes to do puzzle books. Likes to read      She eats a healthy diet   She does not exercise    Lives at home with husband and granddaughter   Caffeine: 4 cups/day   Social Determinants of Health   Financial Resource Strain: Low Risk    Difficulty  of Paying Living Expenses: Not very hard  Food Insecurity: No Food Insecurity   Worried About [place] in the Last Year: Never true   Ran Out of Food in the Last Year: Never true  Transportation Needs: No Transportation Needs   Lack of Transportation (Medical): No   Lack of Transportation (Non-Medical): No  Physical Activity: Unknown   Days of Exercise per Week: 0 days   Minutes of Exercise per Session: Not on file  Stress: No Stress Concern Present   Feeling of Stress : Not at all  Social Connections: Socially Integrated   Frequency of Communication with Friends and Family: More than three times a week   Frequency of Social Gatherings with Friends and Family: More than three times a week   Attends Religious Services: More than 4 times per year   Active Member of Clubs or Organizations: Yes   Attends Programme researcher, broadcasting/film/video: More than 4 times per year   Marital Status: Married  Human resources officer Violence: Not on file    Past Surgical History:  Procedure Laterality Date   ABDOMINAL HYSTERECTOMY  06/25/2000   left salpingo-oophorectomy   BACK SURGERY  07/30/1995   discetomy, lumbar spinal fusion   CARDIAC CATHETERIZATION  04/01/2006   CESAREAN SECTION  07/29/1989   CHOLECYSTECTOMY  07/30/1987   COLONOSCOPY     EXPLORATORY LAPAROTOMY  05/08/2000   right salpingo-oophorectomy   LAMINOTOMY / EXCISION DISK POSTERIOR CERVICAL SPINE  08/10/2009   C7-T1   MENISCUS REPAIR Right 04/2021   POLYPECTOMY     HPP 2012   ROUX-EN-Y GASTRIC BYPASS  05/19/2007   SHOULDER ARTHROSCOPY  01/02/2006; 05/31/2004   left 2007; right 2005   SHOULDER ARTHROSCOPY  07/02/2011   Procedure: ARTHROSCOPY SHOULDER;  Surgeon: Cammie Sickle., MD;  Location: [place];  Service: Orthopedics;  Laterality: Right;  repair supraspinatus, Debride glenohumeral joint, labrum   SHOULDER ARTHROSCOPY DISTAL CLAVICLE EXCISION AND OPEN ROTATOR CUFF REPAIR  07/26/2010   right   THYROID SURGERY  early 2000s   goiter   TUBAL LIGATION      Family History  Problem Relation Age of Onset   Diabetes Mother    Hyperlipidemia Mother    Hypertension Mother    Thyroid disease Mother    Rheum arthritis Mother    COPD Mother    Cancer Paternal Grandfather        Prostate Cancer   Coronary artery disease Other    Colon cancer Neg Hx    Colon polyps Neg Hx    Esophageal cancer Neg Hx    Rectal cancer Neg Hx    Stomach cancer Neg Hx    Pancreatic cancer Neg Hx    Sleep apnea Neg Hx     Allergies  Allergen Reactions   Shrimp Flavor Swelling    Swelling of tongue and rash   Ace Inhibitors Cough    Current Outpatient Medications on File Prior to Visit  Medication Sig Dispense Refill   B Complex Vitamins (B COMPLEX PO) Take by mouth.     BIOTIN PO Take by mouth.     bisoprolol-hydrochlorothiazide (ZIAC)  10-6.25 MG tablet Take 1 tablet by mouth daily. 90 tablet 3   Blood Glucose Monitoring Suppl (ONETOUCH VERIO) w/Device KIT 1 Units by Does not apply route daily. 1 kit 0   Cholecalciferol (VITAMIN D3 PO) Take by mouth.     clotrimazole-betamethasone (LOTRISONE) cream Apply 1 application topically daily. Apply thin layer  to affected area twice daily (Patient taking differently: Apply 1 application topically daily. Apply thin layer to affected area twice daily; as needed) 30 g 0   Continuous Blood Gluc Sensor (FREESTYLE LIBRE 14 DAY SENSOR) MISC Use with smart phoen 6 each 3   ferrous sulfate 325 (65 FE) MG EC tablet Take 325 mg by mouth 3 (three) times daily with meals.     fluticasone (FLONASE) 50 MCG/ACT nasal spray USE 2 SPRAYS IN EACH NOSTRIL DAILY (Patient taking differently: as needed.) 48 g 3   gabapentin (NEURONTIN) 400 MG capsule TAKE 2 CAPSULES THREE TIMES A DAY 540 capsule 3   glipiZIDE (GLUCOTROL XL) 10 MG 24 hr tablet Take 1 tablet (10 mg total) by mouth daily with breakfast. 90 tablet 0   glipiZIDE (GLUCOTROL XL) 5 MG 24 hr tablet TAKE 1 TABLET BY MOUTH EVERY DAY IN THE EVENING 90 tablet 0   HYDROcodone-acetaminophen (NORCO/VICODIN) 5-325 MG tablet Take 1 tablet by mouth 3 (three) times daily.     ibuprofen (ADVIL) 200 MG tablet Take 400 mg by mouth as needed.     levothyroxine (SYNTHROID) 112 MCG tablet Take 1 tablet (112 mcg total) by mouth daily. 90 tablet 3   lidocaine (XYLOCAINE) 5 % ointment Apply 1 application topically 4 (four) times daily as needed. On bottom of feet B/L 35.44 g 0   loperamide (IMODIUM A-D) 2 MG tablet Take 1 tablet (2 mg total) by mouth 4 (four) times daily as needed for diarrhea or loose stools. 30 tablet 0   meloxicam (MOBIC) 7.5 MG tablet Take 7.5 mg by mouth daily.     metFORMIN (GLUCOPHAGE) 1000 MG tablet Take 1 tablet (1,000 mg total) by mouth daily with breakfast. 90 tablet 0   nitroGLYCERIN (NITROSTAT) 0.4 MG SL tablet Place 1 tablet (0.4 mg total)  under the tongue every 5 (five) minutes as needed for chest pain (CP or SOB). 60 tablet 12   ONE TOUCH LANCETS MISC Test blood sugars once daily Dx: E11.9 100 each 3   ONETOUCH VERIO test strip USE TO TEST BLOOD GLUCOSE THREE TIMES A DAY 300 strip 3   oxybutynin (DITROPAN-XL) 5 MG 24 hr tablet Take 1 tablet (5 mg total) by mouth at bedtime. 90 tablet 3   pantoprazole (PROTONIX) 40 MG tablet Take 1 tablet (40 mg total) by mouth daily. 30 tablet 3   simvastatin (ZOCOR) 20 MG tablet Take 1 tablet (20 mg total) by mouth at bedtime. 90 tablet 3   TRULICITY 3 UX/8.3FX SOPN INJECT 3 MG AS DIRECTED ONCE A WEEK. 3 mL 3   VITAMIN E PO Take by mouth.     No current facility-administered medications on file prior to visit.    BP 124/72 (BP Location: Left Arm, Patient Position: Sitting, Cuff Size: Normal)    Pulse 83    Temp 98 F (36.7 C) (Oral)    Ht '5\' 1"'  (1.549 m)    Wt 178 lb 3.2 oz (80.8 kg)    SpO2 98%    BMI 33.67 kg/m       Objective:   Physical Exam Vitals and nursing note reviewed.  Constitutional:      Appearance: Normal appearance.  Cardiovascular:     Rate and Rhythm: Normal rate and regular rhythm.     Pulses: Normal pulses.     Heart sounds: Normal heart sounds.  Pulmonary:     Effort: Pulmonary effort is normal.     Breath sounds: Normal breath sounds.  Skin:    General: Skin is warm and dry.  Neurological:     General: No focal deficit present.     Mental Status: She is alert and oriented to person, place, and time.  Psychiatric:        Mood and Affect: Mood normal.        Behavior: Behavior normal.        Thought Content: Thought content normal.        Judgment: Judgment normal.      Assessment & Plan:  1. Type 2 diabetes mellitus with diabetic neuropathy, without long-term current use of insulin (HCC)  - POC HgB A1c- 5.9  - She has lost about 5 pounds.  - encouraged to continue with what she is doing  - Will d/c glipizide at night ( 5 mg) - Follow up in 3  months   2. Essential hypertension - Well controlled.  - No change in medications   Dorothyann Peng, NP

## 2021-08-20 DIAGNOSIS — Z79899 Other long term (current) drug therapy: Secondary | ICD-10-CM | POA: Diagnosis not present

## 2021-08-20 DIAGNOSIS — M1711 Unilateral primary osteoarthritis, right knee: Secondary | ICD-10-CM | POA: Diagnosis not present

## 2021-08-20 DIAGNOSIS — M25561 Pain in right knee: Secondary | ICD-10-CM | POA: Diagnosis not present

## 2021-08-20 DIAGNOSIS — G629 Polyneuropathy, unspecified: Secondary | ICD-10-CM | POA: Diagnosis not present

## 2021-08-27 DIAGNOSIS — S83241D Other tear of medial meniscus, current injury, right knee, subsequent encounter: Secondary | ICD-10-CM | POA: Diagnosis not present

## 2021-08-31 ENCOUNTER — Telehealth: Payer: Self-pay

## 2021-08-31 NOTE — Telephone Encounter (Signed)
Alexandra [name] - PA Case ID: [id number] Need help? Call us at [phone] Outcome   [place] January 12 Request Reference Number: [id number]. FREESTYLE KIT SENSOR is   approved through 07/28/2022. Your patient may now fill this prescription and it will be covered. Drug FreeStyle Libre 14 Day Sensor Form

## 2021-09-03 ENCOUNTER — Ambulatory Visit (INDEPENDENT_AMBULATORY_CARE_PROVIDER_SITE_OTHER): Payer: Medicare Other | Admitting: Neurology

## 2021-09-03 DIAGNOSIS — G4719 Other hypersomnia: Secondary | ICD-10-CM

## 2021-09-03 DIAGNOSIS — R519 Headache, unspecified: Secondary | ICD-10-CM

## 2021-09-03 DIAGNOSIS — G4733 Obstructive sleep apnea (adult) (pediatric): Secondary | ICD-10-CM

## 2021-09-03 DIAGNOSIS — R351 Nocturia: Secondary | ICD-10-CM

## 2021-09-03 DIAGNOSIS — G4720 Circadian rhythm sleep disorder, unspecified type: Secondary | ICD-10-CM

## 2021-09-03 DIAGNOSIS — E669 Obesity, unspecified: Secondary | ICD-10-CM

## 2021-09-03 DIAGNOSIS — R0683 Snoring: Secondary | ICD-10-CM

## 2021-09-10 ENCOUNTER — Telehealth: Payer: Self-pay | Admitting: Adult Health

## 2021-09-10 ENCOUNTER — Other Ambulatory Visit: Payer: Self-pay

## 2021-09-10 DIAGNOSIS — E1140 Type 2 diabetes mellitus with diabetic neuropathy, unspecified: Secondary | ICD-10-CM

## 2021-09-10 DIAGNOSIS — M792 Neuralgia and neuritis, unspecified: Secondary | ICD-10-CM

## 2021-09-10 DIAGNOSIS — E039 Hypothyroidism, unspecified: Secondary | ICD-10-CM

## 2021-09-10 DIAGNOSIS — I10 Essential (primary) hypertension: Secondary | ICD-10-CM

## 2021-09-10 DIAGNOSIS — E7800 Pure hypercholesterolemia, unspecified: Secondary | ICD-10-CM

## 2021-09-10 MED ORDER — GABAPENTIN 400 MG PO CAPS: ORAL_CAPSULE | ORAL | 3 refills | Status: DC

## 2021-09-10 MED ORDER — SIMVASTATIN 20 MG PO TABS: 20.0000 mg | ORAL_TABLET | Freq: Every day | ORAL | 3 refills | Status: DC

## 2021-09-10 MED ORDER — GLIPIZIDE ER 5 MG PO TB24: ORAL_TABLET | ORAL | 0 refills | Status: DC

## 2021-09-10 MED ORDER — PANTOPRAZOLE SODIUM 40 MG PO TBEC: 40.0000 mg | DELAYED_RELEASE_TABLET | Freq: Every day | ORAL | 3 refills | Status: DC

## 2021-09-10 MED ORDER — ONETOUCH VERIO VI STRP: ORAL_STRIP | 3 refills | Status: DC

## 2021-09-10 MED ORDER — NITROGLYCERIN 0.4 MG SL SUBL: 0.4000 mg | SUBLINGUAL_TABLET | SUBLINGUAL | 12 refills | Status: DC | PRN

## 2021-09-10 MED ORDER — BISOPROLOL-HYDROCHLOROTHIAZIDE 10-6.25 MG PO TABS: 1.0000 | ORAL_TABLET | Freq: Every day | ORAL | 3 refills | Status: DC

## 2021-09-10 MED ORDER — TRULICITY 3 MG/0.5ML ~~LOC~~ SOAJ: 3.0000 mg | SUBCUTANEOUS | 3 refills | Status: DC

## 2021-09-10 MED ORDER — LEVOTHYROXINE SODIUM 112 MCG PO TABS: 112.0000 ug | ORAL_TABLET | Freq: Every day | ORAL | 3 refills | Status: DC

## 2021-09-10 MED ORDER — METFORMIN HCL 1000 MG PO TABS: 1000.0000 mg | ORAL_TABLET | Freq: Every day | ORAL | 0 refills | Status: DC

## 2021-09-10 MED ORDER — ONETOUCH VERIO W/DEVICE KIT: 1.0000 | PACK | Freq: Every day | 0 refills | Status: DC

## 2021-09-10 MED ORDER — OXYBUTYNIN CHLORIDE ER 5 MG PO TB24: 5.0000 mg | ORAL_TABLET | Freq: Every day | ORAL | 3 refills | Status: DC

## 2021-09-10 MED ORDER — FREESTYLE LIBRE 14 DAY SENSOR MISC: 3 refills | Status: DC

## 2021-09-10 MED ORDER — GLIPIZIDE ER 10 MG PO TB24: 10.0000 mg | ORAL_TABLET | Freq: Every day | ORAL | 0 refills | Status: DC

## 2021-09-10 NOTE — Telephone Encounter (Signed)
All prescriptions sent again to [facility].

## 2021-09-10 NOTE — Telephone Encounter (Signed)
Kay scr with [facility] is calling and they did not received rxs for glucose blood (ONETOUCH VERIO) test strip, levothyroxine (SYNTHROID) 112 MCG tablet, metFORMIN (GLUCOPHAGE) 1000 MG tablet, oxybutynin (DITROPAN-XL) 5 MG 24 hr tablet, pantoprazole (PROTONIX) 40 MG tablet,simvastatin (ZOCOR) 20 MG tablet, glipiZIDE (GLUCOTROL XL) 10 MG 24 hr tablet, glipiZIDE (GLUCOTROL XL) 5 MG 24 hr tablet REF number is [id number]. They did not get rxs that was sent on 08-02-2021

## 2021-09-12 NOTE — Procedures (Signed)
PATIENT'S NAME:  Alexandra [name], Alexandra [name] DOB:      22-Jun-1961      MR#:    [id number]     DATE OF RECORDING: 09/03/2021 REFERRING M.D.:  Dorothyann Peng, NP Study Performed:   Baseline Polysomnogram HISTORY: 61 year old woman with a history of allergies, anemia, history of TIA, diabetes, reflux disease, hyperlipidemia, hypertension, thyroid disease, neuropathy, history of, obesity with status post gastric bypass in 2008, arthritis with status post neck surgery, status post shoulder surgeries, who presents for evaluation of her prior diagnosis of sleep apnea. She tried positive airway pressure treatment but could not tolerate it at the time. The patient endorsed the Epworth Sleepiness Scale at 17 points. The patient's weight 180 pounds with a height of 61 (inches), resulting in a BMI of 34.1 kg/m2. The patient's neck circumference measured 17 inches.  CURRENT MEDICATIONS: Vitamin B Complex, Viotin, Ziac, Vitamin D3, Lotrisone, Ferrous Sulfate, Flonase, Neurontin, Glucotol XL, Vicodin, Advil, Synthroid, Xylocaine, Mobic, Glucophage, Nitrostat, Ditropan-XL, Protonix, Zocor, Trulicity, Vitamin E, Imodium AD   PROCEDURE:  This is a multichannel digital polysomnogram utilizing the Somnostar 11.2 system.  Electrodes and sensors were applied and monitored per AASM Specifications.   EEG, EOG, Chin and Limb EMG, were sampled at 200 Hz.  ECG, Snore and Nasal Pressure, Thermal Airflow, Respiratory Effort, CPAP Flow and Pressure, Oximetry was sampled at 50 Hz. Digital video and audio were recorded.      BASELINE STUDY  Lights Out was at 20:32 and Lights On at 05:10.  Total recording time (TRT) was 518 minutes, with a total sleep time (TST) of 483 minutes.   The patient's sleep latency was 11.5 minutes.  REM latency was 464 minutes, which is markedly delayed.  The sleep efficiency was 93.2 %.     SLEEP ARCHITECTURE: WASO (Wake after sleep onset) was 23.5 minutes.  There were 17 minutes in Stage N1, 357.5 minutes Stage  N2, 73.5 minutes Stage N3 and 35 minutes in Stage REM.  The percentage of Stage N1 was 3.5%, Stage N2 was 74.%, which is increased, Stage N3 was 15.2%, which is normal, and Stage R (REM sleep) was 7.2%, which is reduced. The arousals were noted as: 54 were spontaneous, 0 were associated with PLMs, 1 were associated with respiratory events.  RESPIRATORY ANALYSIS:  There were a total of 5 respiratory events:  0 obstructive apneas, 0 central apneas and 0 mixed apneas with a total of 0 apneas and an apnea index (AI) of 0 /hour. There were 5 hypopneas with a hypopnea index of .6 /hour. The patient also had 0 respiratory event related arousals (RERAs).      The total APNEA/HYPOPNEA INDEX (AHI) was .6/hour and the total RESPIRATORY DISTURBANCE INDEX was  .6 /hour.  1 events occurred in REM sleep and 8 events in NREM. The REM AHI was  1.7 /hour, versus a non-REM AHI of .5. The patient spent 138 minutes of total sleep time in the supine position and 345 minutes in non-supine.. The supine AHI was 0.9 versus a non-supine AHI of 0.5.  OXYGEN SATURATION & C02:  The Wake baseline 02 saturation was 91%, with the lowest being 86%. Time spent below 89% saturation equaled 23 minutes.   PERIODIC LIMB MOVEMENTS: The patient had a total of 0 Periodic Limb Movements.  The Periodic Limb Movement (PLM) index was 0 and the PLM Arousal index was 0/hour.  Audio and video analysis did not show any abnormal or unusual movements, behaviors, phonations or vocalizations. The patient took  2 bathroom breaks. Mild intermittent snoring was noted. The EKG was in keeping with normal sinus rhythm (NSR).  Post-study, the patient indicated that sleep was the same as usual.   IMPRESSION:  Primary Snoring Dysfunctions associated with sleep stages or arousal from sleep  RECOMMENDATIONS:  This study does not demonstrate any significant obstructive or central sleep disordered breathing. Mild, intermittent snoring was noted. Treatment with a  positive airway pressure device is not necessary.  This study shows abnormal sleep stage percentages, but good sleep consolidation; these are nonspecific findings and per se do not signify an intrinsic sleep disorder or a cause for the patient's sleep-related symptoms. Causes include (but are not limited to) the first night effect of the sleep study, circadian rhythm disturbances, medication effect or an underlying mood disorder or medical problem.  The patient will be advised to follow up with the referring provider, who will be notified of the test results.  I certify that I have reviewed the entire raw data recording prior to the issuance of this report in accordance with the Standards of Accreditation of the American Academy of Sleep Medicine (AASM)  Star Age, MD, PhD Diplomat, American Board of Neurology and Sleep Medicine (Neurology and Sleep Medicine)

## 2021-09-19 DIAGNOSIS — Z79899 Other long term (current) drug therapy: Secondary | ICD-10-CM | POA: Diagnosis not present

## 2021-09-19 DIAGNOSIS — G629 Polyneuropathy, unspecified: Secondary | ICD-10-CM | POA: Diagnosis not present

## 2021-09-19 DIAGNOSIS — M1711 Unilateral primary osteoarthritis, right knee: Secondary | ICD-10-CM | POA: Diagnosis not present

## 2021-09-19 DIAGNOSIS — M25561 Pain in right knee: Secondary | ICD-10-CM | POA: Diagnosis not present

## 2021-09-21 DIAGNOSIS — S83241D Other tear of medial meniscus, current injury, right knee, subsequent encounter: Secondary | ICD-10-CM | POA: Diagnosis not present

## 2021-09-21 DIAGNOSIS — S83281D Other tear of lateral meniscus, current injury, right knee, subsequent encounter: Secondary | ICD-10-CM | POA: Diagnosis not present

## 2021-09-27 ENCOUNTER — Ambulatory Visit (INDEPENDENT_AMBULATORY_CARE_PROVIDER_SITE_OTHER): Payer: Medicare Other | Admitting: Adult Health

## 2021-09-27 ENCOUNTER — Other Ambulatory Visit: Payer: Self-pay

## 2021-09-27 ENCOUNTER — Ambulatory Visit (INDEPENDENT_AMBULATORY_CARE_PROVIDER_SITE_OTHER): Payer: Medicare Other

## 2021-09-27 VITALS — BP 130/72 | HR 87 | Temp 98.5°F | Ht 61.0 in | Wt 177.0 lb

## 2021-09-27 DIAGNOSIS — Z01818 Encounter for other preprocedural examination: Secondary | ICD-10-CM | POA: Diagnosis not present

## 2021-09-27 LAB — CBC WITH DIFFERENTIAL/PLATELET
Basophils Absolute: 0 10*3/uL (ref 0.0–0.1)
Basophils Relative: 0.3 % (ref 0.0–3.0)
Eosinophils Absolute: 0.4 10*3/uL (ref 0.0–0.7)
Eosinophils Relative: 4.1 % (ref 0.0–5.0)
HCT: 40.6 % (ref 36.0–46.0)
Hemoglobin: 13.8 g/dL (ref 12.0–15.0)
Lymphocytes Relative: 30.4 % (ref 12.0–46.0)
Lymphs Abs: 3.1 10*3/uL (ref 0.7–4.0)
MCHC: 33.9 g/dL (ref 30.0–36.0)
MCV: 95 fl (ref 78.0–100.0)
Monocytes Absolute: 0.6 10*3/uL (ref 0.1–1.0)
Monocytes Relative: 5.9 % (ref 3.0–12.0)
Neutro Abs: 6.1 10*3/uL (ref 1.4–7.7)
Neutrophils Relative %: 59.3 % (ref 43.0–77.0)
Platelets: 375 10*3/uL (ref 150.0–400.0)
RBC: 4.28 Mil/uL (ref 3.87–5.11)
RDW: 13.1 % (ref 11.5–15.5)
WBC: 10.3 10*3/uL (ref 4.0–10.5)

## 2021-09-27 LAB — COMPREHENSIVE METABOLIC PANEL
ALT: 96 U/L — ABNORMAL HIGH (ref 0–35)
AST: 79 U/L — ABNORMAL HIGH (ref 0–37)
Albumin: 4.3 g/dL (ref 3.5–5.2)
Alkaline Phosphatase: 87 U/L (ref 39–117)
BUN: 13 mg/dL (ref 6–23)
CO2: 33 mEq/L — ABNORMAL HIGH (ref 19–32)
Calcium: 9.9 mg/dL (ref 8.4–10.5)
Chloride: 101 mEq/L (ref 96–112)
Creatinine, Ser: 0.85 mg/dL (ref 0.40–1.20)
GFR: 73.89 mL/min (ref >60.00)
Glucose, Bld: 188 mg/dL — ABNORMAL HIGH (ref 70–99)
Potassium: 3.8 mEq/L (ref 3.5–5.1)
Sodium: 141 mEq/L (ref 135–145)
Total Bilirubin: 0.4 mg/dL (ref 0.2–1.2)
Total Protein: 7.3 g/dL (ref 6.0–8.3)

## 2021-09-27 LAB — PROTIME-INR
INR: 1 ratio (ref 0.8–1.0)
Prothrombin Time: 10.8 s (ref 9.6–13.1)

## 2021-09-27 NOTE — Progress Notes (Signed)
Subjective:    Patient ID: Alexandra [name], female    DOB: 07/10/1961, 61 y.o.   MRN: [id number]  HPI 62 year old female who  has a past medical history of Allergy, Anemia, Blood transfusion without reported diagnosis, Diabetes mellitus ([place]), Fatty liver, GERD (gastroesophageal reflux disease), Hiatal hernia (07/29/1996), Hyperlipidemia, Hyperplastic colon polyp, Hypertension, Hyperthyroidism, Hypothyroidism, Neuromuscular disorder ([place]), Rotator cuff tear, right, Sleep apnea, and Stroke ([place]).  She presents to the office today for pre operative clearance. She will be having a right total knee replacement done at [facility]   She has no questions about the surgery    Review of Systems See HPI  Past Medical History:  Diagnosis Date   Allergy    Anemia    past hx   Blood transfusion without reported diagnosis    Diabetes mellitus ([place])    Fatty liver    GERD (gastroesophageal reflux disease)    prn med.   Hiatal hernia 07/29/1996   Hyperlipidemia    Hyperplastic colon polyp    Hypertension    new dx. - will start med. 06/28/2011   Hyperthyroidism    Hypothyroidism    Neuromuscular disorder ([facility])    hemiplegia , neuropathy   Rotator cuff tear, right    Sleep apnea    prior to gastric bypass - no sleep study since bypass   Stroke [facility])    TIA 2016    Social History   Socioeconomic History   Marital status: Married    Spouse name: Not on file   Number of children: Not on file   Years of education: Not on file   Highest education level: Bachelor's degree (e.g., BA, AB, BS)  Occupational History   Occupation: unemployed  Tobacco Use   Smoking status: Former    Packs/day: 1.00    Years: 10.00    Pack years: 10.00    Types: Cigarettes    Quit date: 07/29/1984    Years since quitting: 37.1   Smokeless tobacco: Never  Vaping Use   Vaping Use: Never used  Substance and Sexual Activity   Alcohol use: Yes    Comment: occassionally   Drug use:  No   Sexual activity: Yes  Other Topics Concern   Not on file  Social History Narrative   Married for 32 years    Six children- all live around here. Two daughters live with her   45 grand children    On Disability for mental issues   No pets   Likes to go to movies and out to dinner. She likes to do puzzle books. Likes to read      She eats a healthy diet   She does not exercise    Lives at home with husband and granddaughter   Caffeine: 4 cups/day   Social Determinants of Health   Financial Resource Strain: Low Risk    Difficulty of Paying Living Expenses: Not very hard  Food Insecurity: No Food Insecurity   Worried About Charity fundraiser in the Last Year: Never true   Ran Out of Food in the Last Year: Never true  Transportation Needs: No Transportation Needs   Lack of Transportation (Medical): No   Lack of Transportation (Non-Medical): No  Physical Activity: Unknown   Days of Exercise per Week: 0 days   Minutes of Exercise per Session: Not on file  Stress: No Stress Concern Present   Feeling of Stress : Not at all  Social  Connections: Socially Integrated   Frequency of Communication with Friends and Family: More than three times a week   Frequency of Social Gatherings with Friends and Family: More than three times a week   Attends Religious Services: More than 4 times per year   Active Member of Clubs or Organizations: Yes   Attends Music therapist: More than 4 times per year   Marital Status: Married  Human resources officer Violence: Not on file    Past Surgical History:  Procedure Laterality Date   ABDOMINAL HYSTERECTOMY  06/25/2000   left salpingo-oophorectomy   BACK SURGERY  07/30/1995   discetomy, lumbar spinal fusion   CARDIAC CATHETERIZATION  04/01/2006   CESAREAN SECTION  07/29/1989   CHOLECYSTECTOMY  07/30/1987   COLONOSCOPY     EXPLORATORY LAPAROTOMY  05/08/2000   right salpingo-oophorectomy   LAMINOTOMY / EXCISION DISK POSTERIOR CERVICAL  SPINE  08/10/2009   C7-T1   MENISCUS REPAIR Right 04/2021   POLYPECTOMY     HPP 2012   ROUX-EN-Y GASTRIC BYPASS  05/19/2007   SHOULDER ARTHROSCOPY  01/02/2006; 05/31/2004   left 2007; right 2005   SHOULDER ARTHROSCOPY  07/02/2011   Procedure: ARTHROSCOPY SHOULDER;  Surgeon: Cammie Sickle., MD;  Location: [facility];  Service: Orthopedics;  Laterality: Right;  repair supraspinatus, Debride glenohumeral joint, labrum   SHOULDER ARTHROSCOPY DISTAL CLAVICLE EXCISION AND OPEN ROTATOR CUFF REPAIR  07/26/2010   right   THYROID SURGERY  early 2000s   goiter   TUBAL LIGATION      Family History  Problem Relation Age of Onset   Diabetes Mother    Hyperlipidemia Mother    Hypertension Mother    Thyroid disease Mother    Rheum arthritis Mother    COPD Mother    Cancer Paternal Grandfather        Prostate Cancer   Coronary artery disease Other    Colon cancer Neg Hx    Colon polyps Neg Hx    Esophageal cancer Neg Hx    Rectal cancer Neg Hx    Stomach cancer Neg Hx    Pancreatic cancer Neg Hx    Sleep apnea Neg Hx     Allergies  Allergen Reactions   Shrimp Flavor Swelling    Swelling of tongue and rash   Ace Inhibitors Cough    Current Outpatient Medications on File Prior to Visit  Medication Sig Dispense Refill   B Complex Vitamins (B COMPLEX PO) Take by mouth.     BIOTIN PO Take by mouth.     bisoprolol-hydrochlorothiazide (ZIAC) 10-6.25 MG tablet Take 1 tablet by mouth daily. 90 tablet 3   Blood Glucose Monitoring Suppl (ONETOUCH VERIO) w/Device KIT 1 Device by Does not apply route daily. Use to check blood glucose 4 times day or prn 1 kit 0   Cholecalciferol (VITAMIN D3 PO) Take by mouth.     clotrimazole-betamethasone (LOTRISONE) cream Apply 1 application topically daily. Apply thin layer to affected area twice daily (Patient taking differently: Apply 1 application topically daily. Apply thin layer to affected area twice daily; as needed) 30 g 0    Continuous Blood Gluc Sensor (FREESTYLE LIBRE 14 DAY SENSOR) MISC Use with smart phoen 6 each 3   diclofenac Sodium (VOLTAREN) 1 % GEL SMARTSIG:4 Gram(s) Topical 1-4 Times Daily     Dulaglutide (TRULICITY) 3 BM/8.4XL SOPN Inject 3 mg as directed once a week. 3 mL 3   DULoxetine (CYMBALTA) 30 MG capsule Take 30 mg  by mouth every morning.     ferrous sulfate 325 (65 FE) MG EC tablet Take 325 mg by mouth 3 (three) times daily with meals.     fluticasone (FLONASE) 50 MCG/ACT nasal spray USE 2 SPRAYS IN EACH NOSTRIL DAILY (Patient taking differently: as needed.) 48 g 3   gabapentin (NEURONTIN) 400 MG capsule TAKE 2 CAPSULES THREE TIMES A DAY 540 capsule 3   glipiZIDE (GLUCOTROL XL) 10 MG 24 hr tablet Take 1 tablet (10 mg total) by mouth daily with breakfast. 90 tablet 0   glipiZIDE (GLUCOTROL XL) 5 MG 24 hr tablet TAKE 1 TABLET BY MOUTH EVERY DAY IN THE EVENING 90 tablet 0   glucose blood (ONETOUCH VERIO) test strip Use to check blood sugar 4times a day or PRn 300 strip 3   HYDROcodone-acetaminophen (NORCO/VICODIN) 5-325 MG tablet Take 1 tablet by mouth 3 (three) times daily.     ibuprofen (ADVIL) 200 MG tablet Take 400 mg by mouth as needed.     levothyroxine (SYNTHROID) 112 MCG tablet Take 1 tablet (112 mcg total) by mouth daily. 90 tablet 3   meloxicam (MOBIC) 7.5 MG tablet Take 7.5 mg by mouth daily.     metFORMIN (GLUCOPHAGE) 1000 MG tablet Take 1 tablet (1,000 mg total) by mouth daily with breakfast. 90 tablet 0   ONE TOUCH LANCETS MISC Test blood sugars once daily Dx: E11.9 100 each 3   oxybutynin (DITROPAN-XL) 5 MG 24 hr tablet Take 1 tablet (5 mg total) by mouth at bedtime. 90 tablet 3   pantoprazole (PROTONIX) 40 MG tablet Take 1 tablet (40 mg total) by mouth daily. 30 tablet 3   risperiDONE (RISPERDAL) 1 MG tablet Take 1 mg by mouth 2 (two) times daily.     simvastatin (ZOCOR) 20 MG tablet Take 1 tablet (20 mg total) by mouth at bedtime. 90 tablet 3   traZODone (DESYREL) 100 MG tablet Take  200 mg by mouth at bedtime as needed.     VITAMIN E PO Take by mouth.     No current facility-administered medications on file prior to visit.    BP 130/72    Pulse 87    Temp 98.5 F (36.9 C) (Oral)    Ht '5\' 1"'  (1.549 m)    Wt 177 lb (80.3 kg)    SpO2 97%    BMI 33.44 kg/m       Objective:   Physical Exam Vitals and nursing note reviewed.  Constitutional:      Appearance: Normal appearance.  Cardiovascular:     Rate and Rhythm: Normal rate and regular rhythm.     Pulses: Normal pulses.     Heart sounds: Normal heart sounds.  Pulmonary:     Effort: Pulmonary effort is normal.     Breath sounds: Normal breath sounds.  Skin:    General: Skin is warm and dry.     Capillary Refill: Capillary refill takes less than 2 seconds.  Neurological:     General: No focal deficit present.     Mental Status: She is alert and oriented to person, place, and time.  Psychiatric:        Mood and Affect: Mood normal.        Behavior: Behavior normal.        Thought Content: Thought content normal.        Judgment: Judgment normal.       Assessment & Plan:  1. Preoperative clearance  - CBC with Differential/Platelet; Future -  Comprehensive metabolic panel; Future - Protime-INR; Future - DG Chest 2 View; Future - EKG 12-Lead- NSR with non specific t wave abnormality. Rate 86. Consistent with previous EKGs.   Dorothyann Peng, NP

## 2021-10-15 DIAGNOSIS — M1711 Unilateral primary osteoarthritis, right knee: Secondary | ICD-10-CM | POA: Diagnosis not present

## 2021-10-18 DIAGNOSIS — M25561 Pain in right knee: Secondary | ICD-10-CM | POA: Diagnosis not present

## 2021-10-18 DIAGNOSIS — M1711 Unilateral primary osteoarthritis, right knee: Secondary | ICD-10-CM | POA: Diagnosis not present

## 2021-10-18 DIAGNOSIS — G629 Polyneuropathy, unspecified: Secondary | ICD-10-CM | POA: Diagnosis not present

## 2021-10-18 DIAGNOSIS — Z79899 Other long term (current) drug therapy: Secondary | ICD-10-CM | POA: Diagnosis not present

## 2021-10-29 NOTE — H&P (Signed)
KNEE ARTHROPLASTY ADMISSION H&P ? ?Patient ID: ?Alexandra [name] ?MRN: [id number] ?DOB/AGE: 1961/04/10 61 y.o. ? ?Chief Complaint: right knee pain. ? ?Planned Procedure Date: 11/13/21 ?Medical and Cardiac Clearance by Dorothyann Peng NP-C   ?PM&R clearance by Dr. Vilinda Flake who wants [place] to manage PO meds  ? ?HPI: ?Alexandra [name] is a 61 y.o. female who presents for evaluation of OA RIGHT KNEE. The patient has a history of pain and functional disability in the right knee due to arthritis and has failed non-surgical conservative treatments for greater than 12 weeks to include NSAID's and/or analgesics, corticosteriod injections, use of assistive devices, and activity modification.  Onset of symptoms was gradual, starting >10 years ago with gradually worsening course since that time. The patient noted prior procedures on the knee to include  arthroscopy and menisectomy on the right knee.  Patient currently rates pain at 9 out of 10 with activity. Patient has night pain, worsening of pain with activity and weight bearing, and pain that interferes with activities of daily living.  Patient has evidence of subchondral sclerosis, periarticular osteophytes, and joint space narrowing by imaging studies.  There is no active infection. ? ?Past Medical History:  ?Diagnosis Date  ? Allergy   ? Anemia   ? past hx  ? Blood transfusion without reported diagnosis   ? Diabetes mellitus ([place])   ? Fatty liver   ? GERD (gastroesophageal reflux disease)   ? prn med.  ? Hiatal hernia 07/29/1996  ? Hyperlipidemia   ? Hyperplastic colon polyp   ? Hypertension   ? new dx. - will start med. 06/28/2011  ? Hyperthyroidism   ? Hypothyroidism   ? Neuromuscular disorder ([place])   ? hemiplegia , neuropathy  ? Rotator cuff tear, right   ? Sleep apnea   ? prior to gastric bypass - no sleep study since bypass  ? Stroke [facility])   ? TIA 2016  ? ?Past Surgical History:  ?Procedure Laterality Date  ? ABDOMINAL HYSTERECTOMY  06/25/2000  ? left  salpingo-oophorectomy  ? BACK SURGERY  07/30/1995  ? discetomy, lumbar spinal fusion  ? CARDIAC CATHETERIZATION  04/01/2006  ? CESAREAN SECTION  07/29/1989  ? CHOLECYSTECTOMY  07/30/1987  ? COLONOSCOPY    ? EXPLORATORY LAPAROTOMY  05/08/2000  ? right salpingo-oophorectomy  ? LAMINOTOMY / EXCISION DISK POSTERIOR CERVICAL SPINE  08/10/2009  ? C7-T1  ? MENISCUS REPAIR Right 04/2021  ? POLYPECTOMY    ? HPP 2012  ? ROUX-EN-Y GASTRIC BYPASS  05/19/2007  ? SHOULDER ARTHROSCOPY  01/02/2006; 05/31/2004  ? left 2007; right 2005  ? SHOULDER ARTHROSCOPY  07/02/2011  ? Procedure: ARTHROSCOPY SHOULDER;  Surgeon: Cammie Sickle., MD;  Location: [facility];  Service: Orthopedics;  Laterality: Right;  repair supraspinatus, Debride glenohumeral joint, labrum  ? SHOULDER ARTHROSCOPY DISTAL CLAVICLE EXCISION AND OPEN ROTATOR CUFF REPAIR  07/26/2010  ? right  ? THYROID SURGERY  early 2000s  ? goiter  ? TUBAL LIGATION    ? ?Allergies  ?Allergen Reactions  ? Shrimp Flavor Swelling  ?  Swelling of tongue and rash  ? Ace Inhibitors Cough  ? ?Prior to Admission medications   ?Medication Sig Start Date End Date Taking? Authorizing Provider  ?B Complex Vitamins (B COMPLEX PO) Take by mouth.    [provider]  ?BIOTIN PO Take by mouth.    [provider]  ?bisoprolol-hydrochlorothiazide (ZIAC) 10-6.25 MG tablet Take 1 tablet by mouth daily. 09/10/21   Dorothyann Peng, NP  ?  Blood Glucose Monitoring Suppl (ONETOUCH VERIO) w/Device KIT 1 Device by Does not apply route daily. Use to check blood glucose 4 times day or prn 09/10/21   Dorothyann Peng, NP  ?Cholecalciferol (VITAMIN D3 PO) Take by mouth.    [provider]  ?clotrimazole-betamethasone (LOTRISONE) cream Apply 1 application topically daily. Apply thin layer to affected area twice daily ?Patient taking differently: Apply 1 application topically daily. Apply thin layer to affected area twice daily; as needed 03/07/21   Dorothyann Peng, NP  ?Continuous  Blood Gluc Sensor (FREESTYLE LIBRE 14 DAY SENSOR) MISC Use with smart phoen 09/10/21   Nafziger, Tommi Rumps, NP  ?diclofenac Sodium (VOLTAREN) 1 % GEL SMARTSIG:4 Gram(s) Topical 1-4 Times Daily 09/19/21   [provider]  ?Dulaglutide (TRULICITY) 3 ZO/1.0RU SOPN Inject 3 mg as directed once a week. 09/10/21   Nafziger, Tommi Rumps, NP  ?DULoxetine (CYMBALTA) 30 MG capsule Take 30 mg by mouth every morning. 09/17/21   [provider]  ?ferrous sulfate 325 (65 FE) MG EC tablet Take 325 mg by mouth 3 (three) times daily with meals.    [provider]  ?fluticasone (FLONASE) 50 MCG/ACT nasal spray USE 2 SPRAYS IN EACH NOSTRIL DAILY ?Patient taking differently: as needed. 06/26/20   Nafziger, Tommi Rumps, NP  ?gabapentin (NEURONTIN) 400 MG capsule TAKE 2 CAPSULES THREE TIMES A DAY 09/10/21   Nafziger, Tommi Rumps, NP  ?glipiZIDE (GLUCOTROL XL) 10 MG 24 hr tablet Take 1 tablet (10 mg total) by mouth daily with breakfast. 09/10/21   Nafziger, Tommi Rumps, NP  ?glipiZIDE (GLUCOTROL XL) 5 MG 24 hr tablet TAKE 1 TABLET BY MOUTH EVERY DAY IN THE EVENING 09/10/21   Nafziger, Tommi Rumps, NP  ?glucose blood (ONETOUCH VERIO) test strip Use to check blood sugar 4times a day or PRn 09/10/21   Nafziger, Tommi Rumps, NP  ?HYDROcodone-acetaminophen (NORCO/VICODIN) 5-325 MG tablet Take 1 tablet by mouth 3 (three) times daily. 02/19/21   [provider]  ?ibuprofen (ADVIL) 200 MG tablet Take 400 mg by mouth as needed.    [provider]  ?levothyroxine (SYNTHROID) 112 MCG tablet Take 1 tablet (112 mcg total) by mouth daily. 09/10/21   Nafziger, Tommi Rumps, NP  ?meloxicam (MOBIC) 7.5 MG tablet Take 7.5 mg by mouth daily. 02/14/21   [provider]  ?metFORMIN (GLUCOPHAGE) 1000 MG tablet Take 1 tablet (1,000 mg total) by mouth daily with breakfast. 09/10/21   Dorothyann Peng, NP  ?ONE TOUCH LANCETS MISC Test blood sugars once daily Dx: E11.9 08/01/17   Nafziger, Tommi Rumps, NP  ?oxybutynin (DITROPAN-XL) 5 MG 24 hr tablet Take 1 tablet (5 mg total) by mouth at  bedtime. 09/10/21   Nafziger, Tommi Rumps, NP  ?pantoprazole (PROTONIX) 40 MG tablet Take 1 tablet (40 mg total) by mouth daily. 09/10/21   Dorothyann Peng, NP  ?risperiDONE (RISPERDAL) 1 MG tablet Take 1 mg by mouth 2 (two) times daily. 06/29/21   [provider]  ?simvastatin (ZOCOR) 20 MG tablet Take 1 tablet (20 mg total) by mouth at bedtime. 09/10/21   Nafziger, Tommi Rumps, NP  ?traZODone (DESYREL) 100 MG tablet Take 200 mg by mouth at bedtime as needed. 06/28/21   [provider]  ?VITAMIN E PO Take by mouth.    [provider]  ? ?Social History  ? ?Socioeconomic History  ? Marital status: Married  ?  Spouse name: Not on file  ? Number of children: Not on file  ? Years of education: Not on file  ? Highest education level: Bachelor's degree (e.g., BA,  AB, BS)  ?Occupational History  ? Occupation: unemployed  ?Tobacco Use  ? Smoking status: Former  ?  Packs/day: 1.00  ?  Years: 10.00  ?  Pack years: 10.00  ?  Types: Cigarettes  ?  Quit date: 07/29/1984  ?  Years since quitting: 37.2  ? Smokeless tobacco: Never  ?Vaping Use  ? Vaping Use: Never used  ?Substance and Sexual Activity  ? Alcohol use: Yes  ?  Comment: occassionally  ? Drug use: No  ? Sexual activity: Yes  ?Other Topics Concern  ? Not on file  ?Social History Narrative  ? Married for 32 years   ? Six children- all live around here. Two daughters live with her  ? 35 grand children   ? On Disability for mental issues  ? No pets  ? Likes to go to movies and out to dinner. She likes to do puzzle books. Likes to read  ?   ? She eats a healthy diet  ? She does not exercise   ? Lives at home with husband and granddaughter  ? Caffeine: 4 cups/day  ? ?Social Determinants of Health  ? ?Financial Resource Strain: Low Risk   ? Difficulty of Paying Living Expenses: Not very hard  ?Food Insecurity: No Food Insecurity  ? Worried About Charity fundraiser in the Last Year: Never true  ? Ran Out of Food in the Last Year: Never true  ?Transportation Needs: No  Transportation Needs  ? Lack of Transportation (Medical): No  ? Lack of Transportation (Non-Medical): No  ?Physical Activity: Unknown  ? Days of Exercise per Week: 0 days  ? Minutes of Exercise per Session: Not on fi

## 2021-10-29 NOTE — Patient Instructions (Addendum)
2  VISITORS ARE ALLOWED TO COME WITH YOU AND STAY IN THE WAITING ROOM ONLY DURING PRE OP AND PROCEDURE.   ? ?**NO VISITORS ARE ALLOWED IN THE SHORT STAY AREA OR RECOVERY ROOM!!** ? ?IF YOU WILL BE ADMITTED INTO THE HOSPITAL YOU ARE ALLOWED ONLY 4 SUPPORT PEOPLE DURING VISITATION HOURS ONLY (7 AM -8PM)   ?The support person(s) must pass our screening, gel in and out, and wear a mask at all times, including in the patient?s room. ?Patients must also wear a mask when staff or their support person are in the room. ?Visitors GUEST BADGE MUST BE WORN VISIBLY  ?One adult visitor may remain with you overnight and MUST be in the room by 8 P.M. ?  ? ? Your procedure is scheduled on: 11/13/21 ? ? Report to [facility] Main Entrance ? ?  Report to admitting at  5:15 AM ? ? Call this number if you have problems the morning of surgery [phone] ? ? Do not eat food :After Midnight. ? ? After Midnight you may have the following liquids until _4:30_____ AM DAY OF SURGERY ? ?Water ?Black Coffee (sugar ok, NO MILK/CREAM OR CREAMERS)  ?Tea (sugar ok, NO MILK/CREAM OR CREAMERS) regular and decaf                             ?Plain Jell-O (NO RED)                                           ?Fruit ices (not with fruit pulp, NO RED)                                     ?Popsicles (NO RED)                                                                  ?Juice: apple, WHITE grape, WHITE cranberry ?Sports drinks like Gatorade (NO RED) ?Clear broth(vegetable,chicken,beef) ? ? ?  ?  ?The day of surgery:  ?Drink ONE (1) G2 at  4:15 AM the morning of surgery. Drink in one sitting. Do not sip.  ?This drink was given to you during your hospital  ?pre-op appointment visit. ?Nothing else to drink after completing the  ? G2. At 4:30 AM ?  ?       If you have questions, please contact your surgeon?s office. ? ? ?  ?  ?Oral Hygiene is also important to reduce your risk of infection.                                    ?Remember - BRUSH YOUR  TEETH THE MORNING OF SURGERY WITH YOUR REGULAR TOOTHPASTE ? ? ? Take these medicines the morning of surgery with A SIP OF WATER: Gabapentin, Cymbalta, Risperidone, Levothyroxine, Pantoprazole ? ?How to Manage Your Diabetes ?Before and After Surgery ? ?Why is it important to control my blood sugar before and after surgery? ?Improving blood  sugar levels before and after surgery helps healing and can limit problems. ?A way of improving blood sugar control is eating a healthy diet by: ? Eating less sugar and carbohydrates ? Increasing activity/exercise ? Talking with your doctor about reaching your blood sugar goals ?High blood sugars (greater than 180 mg/dL) can raise your risk of infections and slow your recovery, so you will need to focus on controlling your diabetes during the weeks before surgery. ?Make sure that the doctor who takes care of your diabetes knows about your planned surgery including the date and location. ? ?How do I manage my blood sugar before surgery? ?Check your blood sugar at least 4 times a day, starting 2 days before surgery, to make sure that the level is not too high or low. ?Check your blood sugar the morning of your surgery when you wake up and every 2 hours until you get to the Short Stay unit. ?If your blood sugar is less than 70 mg/dL, you will need to treat for low blood sugar: ?Do not take insulin. ?Treat a low blood sugar (less than 70 mg/dL) with ? cup of clear juice (cranberry or apple), 4 glucose tablets, OR glucose gel. ?Recheck blood sugar in 15 minutes after treatment (to make sure it is greater than 70 mg/dL). If your blood sugar is not greater than 70 mg/dL on recheck, call [phone] for further instructions. ?Report your blood sugar to the short stay nurse when you get to Short Stay. ? ?If you are admitted to the hospital after surgery: ?Your blood sugar will be checked by the staff and you will probably be given insulin after surgery (instead of oral diabetes medicines)  to make sure you have good blood sugar levels. ?The goal for blood sugar control after surgery is 80-180 mg/dL. ? ? ?WHAT DO I DO ABOUT MY DIABETES MEDICATION? ? ?Do not take your night dose of Glipizide the night before surgery. ? ?Do not take oral diabetes medicines (pills) the morning of surgery. ? ? ?The day of surgery, do not take other diabetes injectables, including Byetta (exenatide), Bydureon (exenatide ER), Victoza (liraglutide), or Trulicity (dulaglutide). ? ? ?Bring CPAP mask and tubing day of surgery. ?                  ?           You may not have any metal on your body including hair pins, jewelry, and body piercing ? ?           Do not wear make-up, lotions, powders, perfumes or deodorant ? ?Do not wear nail polish including gel and S&S, artificial/acrylic nails, or any other type of covering on natural nails including finger and toenails. If you have artificial nails, gel coating, etc. that needs to be removed by a nail salon please have this removed prior to surgery or surgery may need to be canceled/ delayed if the surgeon/ anesthesia feels like they are unable to be safely monitored.  ? ?Do not shave  48 hours prior to surgery.  ? ? ? Do not bring valuables to the hospital. [facility] NOT ?            RESPONSIBLE   FOR VALUABLES. ? ? Contacts, dentures or bridgework may not be worn into surgery. ? ?  ? Patients discharged on the day of surgery will not be allowed to drive home.  Someone NEEDS to stay with you for the first 24 hours after anesthesia. ? ?  Special Instructions: Bring a copy of your healthcare power of attorney and living will documents the day of surgery if you haven't scanned them before. ? ?            Please read over the following fact sheets you were given: IF [place] [phone] ? ?   [facility] - Preparing for Surgery ?Before surgery, you can play an important role.  Because skin is not sterile, your skin needs to be  as free of germs as possible.  You can reduce the number of germs on your skin by washing with CHG (chlorahexidine gluconate) soap before surgery.  CHG is an antiseptic cleaner which kills germs and bonds with the skin to continue killing germs even after washing. ?Please DO NOT use if you have an allergy to CHG or antibacterial soaps.  If your skin becomes reddened/irritated stop using the CHG and inform your nurse when you arrive at Short Stay. ?Do not shave (including legs and underarms) for at least 48 hours prior to the first CHG shower.  ?Please follow these instructions carefully: ? 1.  Shower with CHG Soap the night before surgery and the  morning of Surgery. ? 2.  If you choose to wash your hair, wash your hair first as usual with your  normal  shampoo. ? 3.  After you shampoo, rinse your hair and body thoroughly to remove the  shampoo.                 ?           4.  Use CHG as you would any other liquid soap.  You can apply chg directly  to the skin and wash  ?                     Gently with a scrungie or clean washcloth. ? 5.  Apply the CHG Soap to your body ONLY FROM THE NECK DOWN.   Do not use on face/ open      ?                     Wound or open sores. Avoid contact with eyes, ears mouth and genitals (private parts).  ?                     Production manager,  Genitals (private parts) with your normal soap. ?            6.  Wash thoroughly, paying special attention to the area where your surgery  will be performed. ? 7.  Thoroughly rinse your body with warm water from the neck down. ? 8.  DO NOT shower/wash with your normal soap after using and rinsing off  the CHG Soap. ?               9.  Pat yourself dry with a clean towel. ?           10.  Wear clean pajamas. ?           11.  Place clean sheets on your bed the night of your first shower and do not  sleep with pets. ?Day of Surgery : ?Do not apply any lotions/deodorants the morning of surgery.  Please wear clean clothes to the hospital/surgery  center. ? ?FAILURE TO FOLLOW THESE INSTRUCTIONS MAY RESULT IN THE CANCELLATION OF YOUR SURGERY ? ? ?________________________________________________________________________  ? ?  Incentive Spirometer ? ?An incentive

## 2021-10-31 ENCOUNTER — Encounter (HOSPITAL_COMMUNITY): Payer: Self-pay

## 2021-10-31 ENCOUNTER — Encounter: Payer: Self-pay | Admitting: Adult Health

## 2021-10-31 ENCOUNTER — Other Ambulatory Visit: Payer: Self-pay

## 2021-10-31 ENCOUNTER — Other Ambulatory Visit (INDEPENDENT_AMBULATORY_CARE_PROVIDER_SITE_OTHER): Payer: Medicare Other

## 2021-10-31 ENCOUNTER — Encounter (HOSPITAL_COMMUNITY)
Admission: RE | Admit: 2021-10-31 | Discharge: 2021-10-31 | Disposition: A | Discharge status: Home / Self Care | Payer: Medicare Other | Source: Ambulatory Visit | Attending: Orthopedic Surgery | Admitting: Orthopedic Surgery

## 2021-10-31 ENCOUNTER — Ambulatory Visit (INDEPENDENT_AMBULATORY_CARE_PROVIDER_SITE_OTHER): Payer: Medicare Other | Admitting: Adult Health

## 2021-10-31 VITALS — BP 140/80 | HR 78 | Temp 98.4°F | Ht 61.0 in | Wt 181.0 lb

## 2021-10-31 DIAGNOSIS — E1140 Type 2 diabetes mellitus with diabetic neuropathy, unspecified: Secondary | ICD-10-CM

## 2021-10-31 DIAGNOSIS — I10 Essential (primary) hypertension: Secondary | ICD-10-CM | POA: Diagnosis not present

## 2021-10-31 DIAGNOSIS — N3281 Overactive bladder: Secondary | ICD-10-CM

## 2021-10-31 DIAGNOSIS — E782 Mixed hyperlipidemia: Secondary | ICD-10-CM

## 2021-10-31 DIAGNOSIS — Z01812 Encounter for preprocedural laboratory examination: Secondary | ICD-10-CM | POA: Diagnosis not present

## 2021-10-31 DIAGNOSIS — Z0000 Encounter for general adult medical examination without abnormal findings: Secondary | ICD-10-CM

## 2021-10-31 DIAGNOSIS — K219 Gastro-esophageal reflux disease without esophagitis: Secondary | ICD-10-CM

## 2021-10-31 DIAGNOSIS — Z01818 Encounter for other preprocedural examination: Secondary | ICD-10-CM

## 2021-10-31 DIAGNOSIS — E119 Type 2 diabetes mellitus without complications: Secondary | ICD-10-CM

## 2021-10-31 DIAGNOSIS — E039 Hypothyroidism, unspecified: Secondary | ICD-10-CM

## 2021-10-31 DIAGNOSIS — M792 Neuralgia and neuritis, unspecified: Secondary | ICD-10-CM

## 2021-10-31 LAB — LIPID PANEL
Cholesterol: 129 mg/dL (ref 0–200)
HDL: 36.7 mg/dL — ABNORMAL LOW (ref >39.00)
LDL Cholesterol: 73 mg/dL (ref 0–99)
NonHDL: 91.95
Total CHOL/HDL Ratio: 4
Triglycerides: 93 mg/dL (ref 0.0–149.0)
VLDL: 18.6 mg/dL (ref 0.0–40.0)

## 2021-10-31 LAB — CBC
HCT: 39.2 % (ref 36.0–46.0)
Hemoglobin: 13.3 g/dL (ref 12.0–15.0)
MCH: 32 pg (ref 26.0–34.0)
MCHC: 33.9 g/dL (ref 30.0–36.0)
MCV: 94.5 fL (ref 80.0–100.0)
Platelets: 369 10*3/uL (ref 150–400)
RBC: 4.15 MIL/uL (ref 3.87–5.11)
RDW: 13.3 % (ref 11.5–15.5)
WBC: 10.1 10*3/uL (ref 4.0–10.5)
nRBC: 0 % (ref 0.0–0.2)

## 2021-10-31 LAB — MICROALBUMIN / CREATININE URINE RATIO
Creatinine,U: 68.6 mg/dL
Microalb Creat Ratio: 1.1 mg/g (ref 0.0–30.0)
Microalb, Ur: 0.8 mg/dL (ref 0.0–1.9)

## 2021-10-31 LAB — BASIC METABOLIC PANEL
Anion gap: 8 (ref 5–15)
BUN: 13 mg/dL (ref 8–23)
CO2: 29 mmol/L (ref 22–32)
Calcium: 9 mg/dL (ref 8.9–10.3)
Chloride: 103 mmol/L (ref 98–111)
Creatinine, Ser: 0.78 mg/dL (ref 0.44–1.00)
GFR, Estimated: >60 mL/min (ref >60)
Glucose, Bld: 82 mg/dL (ref 70–99)
Potassium: 3.5 mmol/L (ref 3.5–5.1)
Sodium: 140 mmol/L (ref 135–145)

## 2021-10-31 LAB — SURGICAL PCR SCREEN
MRSA, PCR: NEGATIVE
Staphylococcus aureus: NEGATIVE

## 2021-10-31 LAB — GLUCOSE, CAPILLARY: Glucose-Capillary: 75 mg/dL (ref 70–99)

## 2021-10-31 LAB — TSH: TSH: 4.35 u[IU]/mL (ref 0.35–5.50)

## 2021-10-31 NOTE — Progress Notes (Signed)
Anesthesia note: ? ?Bowel prep reminder:NA ? ?PCP - Dorothyann Peng NP ?Cardiologist - ?Other-  ? ?Chest x-ray - 09/28/21-epic ?EKG - 10/16/21-epic ?Stress Test - 2014 ?ECHO - 2014 ?Cardiac Cath - 2007 ? ?Pacemaker/ICD device last checked:NA ? ?Sleep Study - yes Pt reports that she lost weight and no longer has sleep apnea ?CPAP - no ? ? ?Fasting Blood Sugar - 78-140 ?Checks Blood Sugar _Libre  on Lt arm at PAT Pt will remove it for surgery____ ? ?Blood Thinner:NA ?Blood Thinner Instructions: ?Aspirin Instructions: ?Last Dose: ? ?Anesthesia review: yes ? ?Patient denies shortness of breath, fever, cough and chest pain at PAT appointment ?Pt hasn't been climbing stairs but reports no SOB with activities ?CBG was low because she needed to be NPO for afternoon labs with her PCP. She ate this morning. ? ?Patient verbalized understanding of instructions that were given to them at the PAT appointment. Patient was also instructed that they will need to review over the PAT instructions again at home before surgery. yes ?

## 2021-10-31 NOTE — Addendum Note (Signed)
Addended by: Apolinar Junes on: 10/31/2021 03:17 PM ? ? Modules accepted: Orders ? ?

## 2021-10-31 NOTE — Progress Notes (Signed)
? ?Subjective:  ? ? Patient ID: Alexandra [name], female    DOB: [birth year], 61 y.o.   MRN: [id number] ? ?HPI ?Patient presents for yearly preventative medicine examination. She is a pleasant 61 year old female who  has a past medical history of Allergy, Anemia, Blood transfusion without reported diagnosis, Diabetes mellitus ([place]), Fatty liver, GERD (gastroesophageal reflux disease), Hiatal hernia (07/29/1996), Hyperlipidemia, Hyperplastic colon polyp, Hypertension, Hyperthyroidism, Hypothyroidism, Neuromuscular disorder ([place]), Rotator cuff tear, right, Sleep apnea, and Stroke ([place]). ? ?She recently got new insurance who is requiring her to have a CPE done  ? ?Her last CPE was in October 2022.  ? ?She is also having labs done today for presurgical clearance for her right knee replacement  ? ?DM Type 2 -managed with Trulicity 3 mg weekly and glipizide 10 mg XR in the morning and 5 mg XR in the evening and metformin 1000 daily.  She does monitor her blood sugars at home with the [facility] system and is at goal about 60% of the time.  ?Lab Results  ?Component Value Date  ? HGBA1C 5.9 (A) 08/17/2021  ? ?HTN -prescribed Ziac 10-6.25 mg daily.  She denies episodes of dizziness, lightheadedness, chest pain, or shortness of breath ?BP Readings from Last 3 Encounters:  ?10/31/21 140/80  ?09/27/21 130/72  ?08/17/21 124/72  ? ?Hypothyroidism-currently prescribed Synthroid 112 mcg. ?Lab Results  ?Component Value Date  ? TSH 2.58 05/17/2021  ? ?Hyperlipidemia-managed with simvastatin 20 mg nightly.  She denies myalgia or fatigue ?Lab Results  ?Component Value Date  ? CHOL 126 05/17/2021  ? HDL 38.50 (L) 05/17/2021  ? [place] 72 05/17/2021  ? TRIG 78.0 05/17/2021  ? CHOLHDL 3 05/17/2021  ? ? ?Diabetic neuropathy-prescribed gabapentin 800 mg 3 times daily. ? ?OAB-well-controlled with Ditropan XL 5 mg  ? ?Chronic Pain -right knee.  She is followed by pain management at [facility].  Currently prescribed hydrocodone 5 mg  3 times daily and upcoming right knee replacement.  ? ?GERD - Takes Protonix 40 mg daily. Feels well controlled.  ? ?All immunizations and health maintenance protocols were reviewed with the patient and needed orders were placed. ? ?Appropriate screening laboratory values were ordered for the patient including screening of hyperlipidemia, renal function and hepatic function. ? ? ?Medication reconciliation,  past medical history, social history, problem list and allergies were reviewed in detail with the patient ? ?Goals were established with regard to weight loss, exercise, and  diet in compliance with medications ?Wt Readings from Last 3 Encounters:  ?10/31/21 181 lb (82.1 kg)  ?09/27/21 177 lb (80.3 kg)  ?08/17/21 178 lb 3.2 oz (80.8 kg)  ? ?Review of Systems  ?Constitutional: Negative.   ?HENT: Negative.    ?Eyes: Negative.   ?Respiratory: Negative.    ?Cardiovascular: Negative.   ?Gastrointestinal: Negative.   ?Endocrine: Negative.   ?Genitourinary: Negative.   ?Musculoskeletal:  Positive for arthralgias.  ?Skin: Negative.   ?Allergic/Immunologic: Negative.   ?Neurological: Negative.   ?Hematological: Negative.   ?Psychiatric/Behavioral: Negative.    ?Past Medical History:  ?Diagnosis Date  ? Allergy   ? Anemia   ? past hx  ? Blood transfusion without reported diagnosis   ? Diabetes mellitus ([place])   ? Fatty liver   ? GERD (gastroesophageal reflux disease)   ? prn med.  ? Hiatal hernia 07/29/1996  ? Hyperlipidemia   ? Hyperplastic colon polyp   ? Hypertension   ? new dx. - will start med. 06/28/2011  ? Hyperthyroidism   ?  Hypothyroidism   ? Neuromuscular disorder ([place])   ? hemiplegia , neuropathy  ? Rotator cuff tear, right   ? Sleep apnea   ? prior to gastric bypass - no sleep study since bypass  ? Stroke [facility])   ? TIA 2016  ? ? ?Social History  ? ?Socioeconomic History  ? Marital status: Married  ?  Spouse name: Not on file  ? Number of children: Not on file  ? Years of education: Not on file  ? Highest  education level: Bachelor's degree (e.g., BA, AB, BS)  ?Occupational History  ? Occupation: unemployed  ?Tobacco Use  ? Smoking status: Former  ?  Packs/day: 1.00  ?  Years: 10.00  ?  Pack years: 10.00  ?  Types: Cigarettes  ?  Quit date: 07/29/1984  ?  Years since quitting: 37.2  ? Smokeless tobacco: Never  ?Vaping Use  ? Vaping Use: Never used  ?Substance and Sexual Activity  ? Alcohol use: Yes  ?  Comment: occassionally  ? Drug use: No  ? Sexual activity: Yes  ?Other Topics Concern  ? Not on file  ?Social History Narrative  ? Married for 32 years   ? Six children- all live around here. Two daughters live with her  ? 31 grand children   ? On Disability for mental issues  ? No pets  ? Likes to go to movies and out to dinner. She likes to do puzzle books. Likes to read  ?   ? She eats a healthy diet  ? She does not exercise   ? Lives at home with husband and granddaughter  ? Caffeine: 4 cups/day  ? ?Social Determinants of Health  ? ?Financial Resource Strain: Low Risk   ? Difficulty of Paying Living Expenses: Not very hard  ?Food Insecurity: No Food Insecurity  ? Worried About Charity fundraiser in the Last Year: Never true  ? Ran Out of Food in the Last Year: Never true  ?Transportation Needs: No Transportation Needs  ? Lack of Transportation (Medical): No  ? Lack of Transportation (Non-Medical): No  ?Physical Activity: Unknown  ? Days of Exercise per Week: 0 days  ? Minutes of Exercise per Session: Not on file  ?Stress: No Stress Concern Present  ? Feeling of Stress : Not at all  ?Social Connections: Socially Integrated  ? Frequency of Communication with Friends and Family: More than three times a week  ? Frequency of Social Gatherings with Friends and Family: More than three times a week  ? Attends Religious Services: More than 4 times per year  ? Active Member of Clubs or Organizations: Yes  ? Attends Archivist Meetings: More than 4 times per year  ? Marital Status: Married  ?Intimate Partner Violence:  Not on file  ? ? ?Past Surgical History:  ?Procedure Laterality Date  ? ABDOMINAL HYSTERECTOMY  06/25/2000  ? left salpingo-oophorectomy  ? BACK SURGERY  07/30/1995  ? discetomy, lumbar spinal fusion  ? CARDIAC CATHETERIZATION  04/01/2006  ? CESAREAN SECTION  07/29/1989  ? CHOLECYSTECTOMY  07/30/1987  ? COLONOSCOPY    ? EXPLORATORY LAPAROTOMY  05/08/2000  ? right salpingo-oophorectomy  ? LAMINOTOMY / EXCISION DISK POSTERIOR CERVICAL SPINE  08/10/2009  ? C7-T1  ? MENISCUS REPAIR Right 04/2021  ? POLYPECTOMY    ? HPP 2012  ? ROUX-EN-Y GASTRIC BYPASS  05/19/2007  ? SHOULDER ARTHROSCOPY  01/02/2006; 05/31/2004  ? left 2007; right 2005  ? SHOULDER ARTHROSCOPY  07/02/2011  ?  Procedure: ARTHROSCOPY SHOULDER;  Surgeon: Cammie Sickle., MD;  Location: [facility];  Service: Orthopedics;  Laterality: Right;  repair supraspinatus, Debride glenohumeral joint, labrum  ? SHOULDER ARTHROSCOPY DISTAL CLAVICLE EXCISION AND OPEN ROTATOR CUFF REPAIR  07/26/2010  ? right  ? THYROID SURGERY  early 2000s  ? goiter  ? TUBAL LIGATION    ? ? ?Family History  ?Problem Relation Age of Onset  ? Diabetes Mother   ? Hyperlipidemia Mother   ? Hypertension Mother   ? Thyroid disease Mother   ? Rheum arthritis Mother   ? COPD Mother   ? Cancer Paternal Grandfather   ?     Prostate Cancer  ? Coronary artery disease Other   ? Colon cancer Neg Hx   ? Colon polyps Neg Hx   ? Esophageal cancer Neg Hx   ? Rectal cancer Neg Hx   ? Stomach cancer Neg Hx   ? Pancreatic cancer Neg Hx   ? Sleep apnea Neg Hx   ? ? ?Allergies  ?Allergen Reactions  ? Shrimp (Diagnostic) Anaphylaxis  ?  Eating shrimp causes swelling of tongue and rash  ? Ace Inhibitors Cough  ? ? ?Current Outpatient Medications on File Prior to Visit  ?Medication Sig Dispense Refill  ? B Complex Vitamins (B COMPLEX PO) Take 1 tablet by mouth daily.    ? BIOTIN PO Take 1 tablet by mouth daily.    ? bisoprolol-hydrochlorothiazide (ZIAC) 10-6.25 MG tablet Take 1 tablet by mouth daily.  90 tablet 3  ? Blood Glucose Monitoring Suppl (ONETOUCH VERIO) w/Device KIT 1 Device by Does not apply route daily. Use to check blood glucose 4 times day or prn 1 kit 0  ? Cholecalciferol (VITAMIN D3 PO)

## 2021-11-01 LAB — HEMOGLOBIN A1C
Hgb A1c MFr Bld: 6.9 % — ABNORMAL HIGH (ref 4.8–5.6)
Mean Plasma Glucose: 151 mg/dL

## 2021-11-05 ENCOUNTER — Other Ambulatory Visit: Payer: BC Managed Care – PPO

## 2021-11-05 NOTE — Progress Notes (Signed)
Anesthesia Chart Review: ? ? Case: [id number] Date/Time: 11/13/21 0715  ? Procedure: TOTAL KNEE ARTHROPLASTY (Right: Knee)  ? Anesthesia type: Choice  ? Pre-op diagnosis: OA RIGHT KNEE  ? Location: [facility] ROOM 08 / [facility]  ? Surgeons: Renette Butters, MD  ? ?  ? ? ?DISCUSSION: ?Pt is 61 years old with hx HTN, TIA (2016), DM, fatty liver, obesity (s/p gastric bypass 2008) ? ?VS: There were no vitals taken for this visit. ? ?PROVIDERS: ?- PCP is Dorothyann Peng, NP ?- Neurologist is Star Age, MD who manages pt's OSA ? ?LABS: Labs reviewed: Acceptable for surgery. ?(all labs ordered are listed, but only abnormal results are displayed) ? ?Labs Reviewed  ?HEMOGLOBIN A1C - Abnormal; Notable for the following components:  ?    Result Value  ? Hgb A1c MFr Bld 6.9 (*)   ? All other components within normal limits  ?SURGICAL PCR SCREEN  ?BASIC METABOLIC PANEL  ?CBC  ?GLUCOSE, CAPILLARY  ? ? ? ?IMAGES: ?CXR 09/28/21:  ?- No active cardiopulmonary disease. ? ? ?EKG 10/16/21: NSR. Nonspecific T wave abnormality ? ? ?CV: ?Normal Lexiscan myovue in 2014 ? ?Echo 2014 normal except mildly dilated LA ? ?Normal carotid arteries 2014 ? ?Past Medical History:  ?Diagnosis Date  ? Allergy   ? Anemia   ? past hx  ? Blood transfusion without reported diagnosis   ? Diabetes mellitus ([place])   ? Fatty liver   ? GERD (gastroesophageal reflux disease)   ? prn med.  ? Hiatal hernia 07/29/1996  ? Hyperlipidemia   ? Hyperplastic colon polyp   ? Hypertension   ? new dx. - will start med. 06/28/2011  ? Hypothyroidism   ? Neuromuscular disorder ([place])   ? hemiplegia , neuropathy  ? Rotator cuff tear, right   ? Stroke [facility])   ? TIA 2016  ? ? ?Past Surgical History:  ?Procedure Laterality Date  ? ABDOMINAL HYSTERECTOMY  06/25/2000  ? left salpingo-oophorectomy  ? BACK SURGERY  07/30/1995  ? discetomy, lumbar spinal fusion  ? CARDIAC CATHETERIZATION  04/01/2006  ? CESAREAN SECTION  07/29/1989  ? CHOLECYSTECTOMY  07/30/1987  ? COLONOSCOPY    ? EXPLORATORY  LAPAROTOMY  05/08/2000  ? right salpingo-oophorectomy  ? LAMINOTOMY / EXCISION DISK POSTERIOR CERVICAL SPINE  08/10/2009  ? C7-T1  ? MENISCUS REPAIR Right 04/2021  ? Knee  ? POLYPECTOMY    ? HPP 2012  ? ROUX-EN-Y GASTRIC BYPASS  05/19/2007  ? SHOULDER ARTHROSCOPY  01/02/2006; 05/31/2004  ? left 2007; right 2005  ? SHOULDER ARTHROSCOPY  07/02/2011  ? Procedure: ARTHROSCOPY SHOULDER;  Surgeon: Cammie Sickle., MD;  Location: [facility];  Service: Orthopedics;  Laterality: Right;  repair supraspinatus, Debride glenohumeral joint, labrum  ? SHOULDER ARTHROSCOPY DISTAL CLAVICLE EXCISION AND OPEN ROTATOR CUFF REPAIR  07/26/2010  ? right  ? THYROID SURGERY  early 2000s  ? goiter  ? TUBAL LIGATION  1993  ? ? ?MEDICATIONS: ? B Complex Vitamins (B COMPLEX PO)  ? BIOTIN PO  ? bisoprolol-hydrochlorothiazide (ZIAC) 10-6.25 MG tablet  ? Blood Glucose Monitoring Suppl (ONETOUCH VERIO) w/Device KIT  ? Cholecalciferol (VITAMIN D3 PO)  ? Continuous Blood Gluc Sensor (FREESTYLE LIBRE 14 DAY SENSOR) MISC  ? diclofenac Sodium (VOLTAREN) 1 % GEL  ? docusate sodium (COLACE) 100 MG capsule  ? Dulaglutide (TRULICITY) 3 TG/6.2IR SOPN  ? DULoxetine (CYMBALTA) 30 MG capsule  ? ferrous sulfate 325 (65 FE) MG EC tablet  ? fluticasone (FLONASE) 50  MCG/ACT nasal spray  ? gabapentin (NEURONTIN) 400 MG capsule  ? glipiZIDE (GLUCOTROL XL) 10 MG 24 hr tablet  ? glipiZIDE (GLUCOTROL XL) 5 MG 24 hr tablet  ? glucose blood (ONETOUCH VERIO) test strip  ? HYDROcodone-acetaminophen (NORCO/VICODIN) 5-325 MG tablet  ? ibuprofen (ADVIL) 200 MG tablet  ? levothyroxine (SYNTHROID) 112 MCG tablet  ? meloxicam (MOBIC) 7.5 MG tablet  ? metFORMIN (GLUCOPHAGE) 1000 MG tablet  ? ONE TOUCH LANCETS MISC  ? oxybutynin (DITROPAN-XL) 5 MG 24 hr tablet  ? pantoprazole (PROTONIX) 40 MG tablet  ? risperiDONE (RISPERDAL) 1 MG tablet  ? simvastatin (ZOCOR) 20 MG tablet  ? traZODone (DESYREL) 100 MG tablet  ? VITAMIN E PO  ? ?No current facility-administered  medications for this encounter.  ? ? ?If no changes, I anticipate pt can proceed with surgery as scheduled.  ? ?Willeen Cass, PhD, FNP-BC ?[facility]/Anesthesiology ?Phone: [phone] ?11/05/2021 1:13 PM ? ? ? ? ? ? ?

## 2021-11-05 NOTE — Anesthesia Preprocedure Evaluation (Addendum)
Anesthesia Evaluation  ?Patient identified by MRN, date of birth, ID band ?Patient awake ? ? ? ?Reviewed: ?Allergy & Precautions, NPO status , Patient's Chart, lab work & pertinent test results, reviewed documented beta blocker date and time  ? ?History of Anesthesia Complications ?Negative for: history of anesthetic complications ? ?Airway ?Mallampati: II ? ?TM Distance: >3 FB ?Neck ROM: Full ? ? ? Dental ? ?(+) Dental Advisory Given ?  ?Pulmonary ?former smoker,  ?  ?Pulmonary exam normal ? ? ? ? ? ? ? Cardiovascular ?hypertension, Pt. on medications and Pt. on home beta blockers ?Normal cardiovascular exam ? ? ?  ?Neuro/Psych ?PSYCHIATRIC DISORDERS Bipolar Disorder  ?Schizoaffective d/o ?TIA Neuromuscular disease No Residual Symptoms   ? GI/Hepatic ?Neg liver ROS, hiatal hernia, GERD  Medicated and Controlled,  ?Endo/Other  ?diabetes, Type 2, Oral Hypoglycemic AgentsHypothyroidism  ?Obesity ? ? Renal/GU ?negative Renal ROS  ? ?  ?Musculoskeletal ?negative musculoskeletal ROS ?(+)  ? Abdominal ?  ?Peds ? Hematology ?negative hematology ROS ?(+)   ?Anesthesia Other Findings ? ? Reproductive/Obstetrics ? ?  ? ? ? ? ? ? ? ? ? ? ? ? ? ?  ?  ? ? ? ? ? ? ?Anesthesia Physical ?Anesthesia Plan ? ?ASA: 3 ? ?Anesthesia Plan: Spinal  ? ?Post-op Pain Management: Regional block* and Tylenol PO (pre-op)*  ? ?Induction:  ? ?PONV Risk Score and Plan: 2 and Treatment may vary due to age or medical condition and Propofol infusion ? ?Airway Management Planned: Natural Airway and Simple Face Mask ? ?Additional Equipment: None ? ?Intra-op Plan:  ? ?Post-operative Plan:  ? ?Informed Consent: I have reviewed the patients History and Physical, chart, labs and discussed the procedure including the risks, benefits and alternatives for the proposed anesthesia with the patient or authorized representative who has indicated his/her understanding and acceptance.  ? ? ? ? ? ?Plan Discussed with: CRNA and  Anesthesiologist ? ?Anesthesia Plan Comments: (Labs reviewed, platelets acceptable. Discussed risks and benefits of spinal, including spinal/epidural hematoma, infection, failed block, and PDPH. Patient expressed understanding and wished to proceed. )  ? ? ? ? ?Anesthesia Quick Evaluation ? ?

## 2021-11-06 NOTE — Care Plan (Signed)
Ortho Bundle Case Management Note ? ?Patient Details  ?Name: Alexandra [name] ?MRN: [id number] ?Date of Birth: [birth year] ? ?  Spoke with patient prior to surgery, she will discharge to home with family to assist. Rolling walker and CPM ordered. OPPT set up with [place]. Patient and MD in agreement with plan  CHoice offered               ? ? ? ?DME Arranged:  Walker rolling, CPM ?DME Agency:  [employer] ? ?HH Arranged:    ?[place] Agency:    ? ?Additional Comments: ?Please contact me with any questions of if this plan should need to change. ? ?Mardelle Matte  [facility] Orthopaedic Specialist  [phone] ?11/06/2021, 10:14 AM ?  ?

## 2021-11-13 ENCOUNTER — Ambulatory Visit (HOSPITAL_BASED_OUTPATIENT_CLINIC_OR_DEPARTMENT_OTHER): Payer: Medicare Other | Admitting: Certified Registered Nurse Anesthetist

## 2021-11-13 ENCOUNTER — Ambulatory Visit (HOSPITAL_COMMUNITY): Payer: Medicare Other

## 2021-11-13 ENCOUNTER — Encounter (HOSPITAL_COMMUNITY)
Admission: AD | Disposition: A | Discharge status: Home / Self Care | Payer: Self-pay | Source: Ambulatory Visit | Attending: Orthopedic Surgery

## 2021-11-13 ENCOUNTER — Encounter (HOSPITAL_COMMUNITY): Payer: Self-pay | Admitting: Orthopedic Surgery

## 2021-11-13 ENCOUNTER — Ambulatory Visit (HOSPITAL_COMMUNITY): Payer: Medicare Other | Admitting: Emergency Medicine

## 2021-11-13 ENCOUNTER — Inpatient Hospital Stay (HOSPITAL_COMMUNITY)
Admission: AD | Admit: 2021-11-13 | Discharge: 2021-11-16 | DRG: 470 | Disposition: A | Discharge status: Home / Self Care | Payer: Medicare Other | Source: Ambulatory Visit | Attending: Orthopedic Surgery | Admitting: Orthopedic Surgery

## 2021-11-13 ENCOUNTER — Other Ambulatory Visit: Payer: Self-pay

## 2021-11-13 DIAGNOSIS — E119 Type 2 diabetes mellitus without complications: Secondary | ICD-10-CM

## 2021-11-13 DIAGNOSIS — Z7985 Long-term (current) use of injectable non-insulin antidiabetic drugs: Secondary | ICD-10-CM | POA: Diagnosis not present

## 2021-11-13 DIAGNOSIS — Z471 Aftercare following joint replacement surgery: Secondary | ICD-10-CM | POA: Diagnosis not present

## 2021-11-13 DIAGNOSIS — Z96651 Presence of right artificial knee joint: Secondary | ICD-10-CM | POA: Diagnosis not present

## 2021-11-13 DIAGNOSIS — R32 Unspecified urinary incontinence: Secondary | ICD-10-CM | POA: Diagnosis not present

## 2021-11-13 DIAGNOSIS — K219 Gastro-esophageal reflux disease without esophagitis: Secondary | ICD-10-CM | POA: Diagnosis not present

## 2021-11-13 DIAGNOSIS — E785 Hyperlipidemia, unspecified: Secondary | ICD-10-CM | POA: Diagnosis present

## 2021-11-13 DIAGNOSIS — Z79891 Long term (current) use of opiate analgesic: Secondary | ICD-10-CM

## 2021-11-13 DIAGNOSIS — M1711 Unilateral primary osteoarthritis, right knee: Principal | ICD-10-CM | POA: Diagnosis present

## 2021-11-13 DIAGNOSIS — Z560 Unemployment, unspecified: Secondary | ICD-10-CM | POA: Diagnosis not present

## 2021-11-13 DIAGNOSIS — Z7984 Long term (current) use of oral hypoglycemic drugs: Secondary | ICD-10-CM | POA: Diagnosis not present

## 2021-11-13 DIAGNOSIS — Z888 Allergy status to other drugs, medicaments and biological substances status: Secondary | ICD-10-CM | POA: Diagnosis not present

## 2021-11-13 DIAGNOSIS — Z90722 Acquired absence of ovaries, bilateral: Secondary | ICD-10-CM

## 2021-11-13 DIAGNOSIS — Z79890 Hormone replacement therapy (postmenopausal): Secondary | ICD-10-CM

## 2021-11-13 DIAGNOSIS — Z91013 Allergy to seafood: Secondary | ICD-10-CM

## 2021-11-13 DIAGNOSIS — Z981 Arthrodesis status: Secondary | ICD-10-CM

## 2021-11-13 DIAGNOSIS — I10 Essential (primary) hypertension: Secondary | ICD-10-CM | POA: Diagnosis present

## 2021-11-13 DIAGNOSIS — Z79899 Other long term (current) drug therapy: Secondary | ICD-10-CM

## 2021-11-13 DIAGNOSIS — Z87891 Personal history of nicotine dependence: Secondary | ICD-10-CM | POA: Diagnosis not present

## 2021-11-13 DIAGNOSIS — G8918 Other acute postprocedural pain: Secondary | ICD-10-CM | POA: Diagnosis not present

## 2021-11-13 DIAGNOSIS — Z9884 Bariatric surgery status: Secondary | ICD-10-CM | POA: Diagnosis not present

## 2021-11-13 DIAGNOSIS — Z8673 Personal history of transient ischemic attack (TIA), and cerebral infarction without residual deficits: Secondary | ICD-10-CM

## 2021-11-13 DIAGNOSIS — Z83438 Family history of other disorder of lipoprotein metabolism and other lipidemia: Secondary | ICD-10-CM

## 2021-11-13 DIAGNOSIS — Z8349 Family history of other endocrine, nutritional and metabolic diseases: Secondary | ICD-10-CM

## 2021-11-13 DIAGNOSIS — Z90710 Acquired absence of both cervix and uterus: Secondary | ICD-10-CM | POA: Diagnosis not present

## 2021-11-13 DIAGNOSIS — K760 Fatty (change of) liver, not elsewhere classified: Secondary | ICD-10-CM | POA: Diagnosis present

## 2021-11-13 DIAGNOSIS — Z8249 Family history of ischemic heart disease and other diseases of the circulatory system: Secondary | ICD-10-CM

## 2021-11-13 DIAGNOSIS — M25761 Osteophyte, right knee: Secondary | ICD-10-CM | POA: Diagnosis not present

## 2021-11-13 DIAGNOSIS — Z9049 Acquired absence of other specified parts of digestive tract: Secondary | ICD-10-CM | POA: Diagnosis not present

## 2021-11-13 DIAGNOSIS — Z9079 Acquired absence of other genital organ(s): Secondary | ICD-10-CM

## 2021-11-13 DIAGNOSIS — M79604 Pain in right leg: Secondary | ICD-10-CM | POA: Diagnosis not present

## 2021-11-13 DIAGNOSIS — E039 Hypothyroidism, unspecified: Secondary | ICD-10-CM | POA: Diagnosis not present

## 2021-11-13 HISTORY — PX: TOTAL KNEE ARTHROPLASTY: SHX125

## 2021-11-13 LAB — GLUCOSE, CAPILLARY: Glucose-Capillary: 151 mg/dL — ABNORMAL HIGH (ref 70–99)

## 2021-11-13 SURGERY — ARTHROPLASTY, KNEE, TOTAL
Anesthesia: Spinal | Site: Knee | Laterality: Right

## 2021-11-13 MED ORDER — BISACODYL 10 MG RE SUPP: 10.0000 mg | Freq: Every day | RECTAL | Status: DC | PRN

## 2021-11-13 MED ORDER — CHLORHEXIDINE GLUCONATE 0.12 % MT SOLN
15.0000 mL | Freq: Once | OROMUCOSAL | Status: AC
  Administered 2021-11-13: 15 mL via OROMUCOSAL

## 2021-11-13 MED ORDER — SODIUM CHLORIDE (PF) 0.9 % IJ SOLN
INTRAMUSCULAR | Status: AC
  Filled 2021-11-13: qty 50

## 2021-11-13 MED ORDER — BUPIVACAINE LIPOSOME 1.3 % IJ SUSP: 20.0000 mL | Freq: Once | INTRAMUSCULAR | Status: DC

## 2021-11-13 MED ORDER — RISPERIDONE 1 MG PO TABS
1.0000 mg | ORAL_TABLET | Freq: Two times a day (BID) | ORAL | Status: DC
  Administered 2021-11-13 – 2021-11-16 (×6): 1 mg via ORAL
  Filled 2021-11-13 (×6): qty 1

## 2021-11-13 MED ORDER — ONDANSETRON HCL 4 MG PO TABS: 4.0000 mg | ORAL_TABLET | Freq: Four times a day (QID) | ORAL | Status: DC | PRN

## 2021-11-13 MED ORDER — CEFAZOLIN SODIUM-DEXTROSE 2-4 GM/100ML-% IV SOLN
2.0000 g | Freq: Four times a day (QID) | INTRAVENOUS | Status: DC
  Administered 2021-11-13: 2 g via INTRAVENOUS

## 2021-11-13 MED ORDER — GLIPIZIDE ER 10 MG PO TB24
10.0000 mg | ORAL_TABLET | Freq: Every day | ORAL | Status: DC
  Administered 2021-11-14 – 2021-11-16 (×3): 10 mg via ORAL
  Filled 2021-11-13 (×3): qty 1

## 2021-11-13 MED ORDER — CEFAZOLIN SODIUM-DEXTROSE 2-4 GM/100ML-% IV SOLN
2.0000 g | INTRAVENOUS | Status: AC
  Administered 2021-11-13: 2 g via INTRAVENOUS
  Filled 2021-11-13: qty 100

## 2021-11-13 MED ORDER — OXYCODONE HCL 5 MG PO TABS
ORAL_TABLET | ORAL | Status: AC
  Filled 2021-11-13: qty 2

## 2021-11-13 MED ORDER — DULOXETINE HCL 30 MG PO CPEP
30.0000 mg | ORAL_CAPSULE | Freq: Every morning | ORAL | Status: DC
  Administered 2021-11-14 – 2021-11-16 (×3): 30 mg via ORAL
  Filled 2021-11-13 (×3): qty 1

## 2021-11-13 MED ORDER — GLIPIZIDE ER 5 MG PO TB24
5.0000 mg | ORAL_TABLET | Freq: Every evening | ORAL | Status: DC
  Administered 2021-11-14 – 2021-11-15 (×2): 5 mg via ORAL
  Filled 2021-11-13 (×2): qty 1

## 2021-11-13 MED ORDER — PHENOL 1.4 % MT LIQD: 1.0000 | OROMUCOSAL | Status: DC | PRN

## 2021-11-13 MED ORDER — METOCLOPRAMIDE HCL 5 MG/ML IJ SOLN: 5.0000 mg | Freq: Three times a day (TID) | INTRAMUSCULAR | Status: DC | PRN

## 2021-11-13 MED ORDER — SIMVASTATIN 20 MG PO TABS
20.0000 mg | ORAL_TABLET | Freq: Every day | ORAL | Status: DC
  Administered 2021-11-13 – 2021-11-15 (×3): 20 mg via ORAL
  Filled 2021-11-13 (×3): qty 1

## 2021-11-13 MED ORDER — ACETAMINOPHEN 500 MG PO TABS
1000.0000 mg | ORAL_TABLET | Freq: Four times a day (QID) | ORAL | Status: AC
  Administered 2021-11-13 – 2021-11-14 (×4): 1000 mg via ORAL
  Filled 2021-11-13 (×3): qty 2

## 2021-11-13 MED ORDER — ACETAMINOPHEN 500 MG PO TABS
ORAL_TABLET | ORAL | Status: AC
  Filled 2021-11-13: qty 2

## 2021-11-13 MED ORDER — PROPOFOL 500 MG/50ML IV EMUL
INTRAVENOUS | Status: DC | PRN
  Administered 2021-11-13: 75 ug/kg/min via INTRAVENOUS

## 2021-11-13 MED ORDER — OXYCODONE HCL 5 MG PO TABS: 5.0000 mg | ORAL_TABLET | Freq: Once | ORAL | Status: AC | PRN

## 2021-11-13 MED ORDER — LACTATED RINGERS IV BOLUS
250.0000 mL | Freq: Once | INTRAVENOUS | Status: AC
  Administered 2021-11-13: 250 mL via INTRAVENOUS

## 2021-11-13 MED ORDER — PHENYLEPHRINE 80 MCG/ML (10ML) SYRINGE FOR IV PUSH (FOR BLOOD PRESSURE SUPPORT)
PREFILLED_SYRINGE | INTRAVENOUS | Status: DC | PRN
  Administered 2021-11-13: 80 ug via INTRAVENOUS

## 2021-11-13 MED ORDER — BUPIVACAINE LIPOSOME 1.3 % IJ SUSP
INTRAMUSCULAR | Status: AC
  Filled 2021-11-13: qty 20

## 2021-11-13 MED ORDER — RIVAROXABAN 10 MG PO TABS: 10.0000 mg | ORAL_TABLET | Freq: Every day | ORAL | 0 refills | Status: DC

## 2021-11-13 MED ORDER — ONDANSETRON 4 MG PO TBDP: 4.0000 mg | ORAL_TABLET | Freq: Two times a day (BID) | ORAL | 0 refills | Status: DC | PRN

## 2021-11-13 MED ORDER — ONDANSETRON HCL 4 MG/2ML IJ SOLN: 4.0000 mg | Freq: Four times a day (QID) | INTRAMUSCULAR | Status: DC | PRN

## 2021-11-13 MED ORDER — LACTATED RINGERS IV SOLN: INTRAVENOUS | Status: DC

## 2021-11-13 MED ORDER — TRANEXAMIC ACID-NACL 1000-0.7 MG/100ML-% IV SOLN
1000.0000 mg | INTRAVENOUS | Status: AC
  Administered 2021-11-13: 1000 mg via INTRAVENOUS
  Filled 2021-11-13: qty 100

## 2021-11-13 MED ORDER — DEXAMETHASONE SODIUM PHOSPHATE 10 MG/ML IJ SOLN
8.0000 mg | Freq: Once | INTRAMUSCULAR | Status: AC
  Administered 2021-11-13: 4 mg via INTRAVENOUS

## 2021-11-13 MED ORDER — FENTANYL CITRATE (PF) 100 MCG/2ML IJ SOLN
INTRAMUSCULAR | Status: AC
Start: 2021-11-13 — End: ?
  Filled 2021-11-13: qty 2

## 2021-11-13 MED ORDER — ONDANSETRON HCL 4 MG/2ML IJ SOLN: 4.0000 mg | Freq: Once | INTRAMUSCULAR | Status: DC | PRN

## 2021-11-13 MED ORDER — HYDROMORPHONE HCL 1 MG/ML IJ SOLN
0.5000 mg | INTRAMUSCULAR | Status: DC | PRN
  Administered 2021-11-13 – 2021-11-14 (×4): 1 mg via INTRAVENOUS
  Filled 2021-11-13 (×5): qty 1

## 2021-11-13 MED ORDER — CEFAZOLIN SODIUM-DEXTROSE 2-4 GM/100ML-% IV SOLN
INTRAVENOUS | Status: AC
Start: 2021-11-13 — End: 2021-11-14
  Filled 2021-11-13: qty 100

## 2021-11-13 MED ORDER — FENTANYL CITRATE PF 50 MCG/ML IJ SOSY
PREFILLED_SYRINGE | INTRAMUSCULAR | Status: AC
  Filled 2021-11-13: qty 3

## 2021-11-13 MED ORDER — PROPOFOL 10 MG/ML IV BOLUS
INTRAVENOUS | Status: DC | PRN
  Administered 2021-11-13: 10 mg via INTRAVENOUS
  Administered 2021-11-13: 20 mg via INTRAVENOUS
  Administered 2021-11-13: 10 mg via INTRAVENOUS

## 2021-11-13 MED ORDER — MIDAZOLAM HCL 5 MG/5ML IJ SOLN
INTRAMUSCULAR | Status: DC | PRN
  Administered 2021-11-13 (×2): 1 mg via INTRAVENOUS

## 2021-11-13 MED ORDER — OXYCODONE HCL 5 MG PO TABS
5.0000 mg | ORAL_TABLET | ORAL | Status: DC | PRN
  Administered 2021-11-13: 10 mg via ORAL
  Administered 2021-11-15: 5 mg via ORAL
  Administered 2021-11-15: 10 mg via ORAL
  Administered 2021-11-16: 5 mg via ORAL
  Filled 2021-11-13: qty 1
  Filled 2021-11-13: qty 2
  Filled 2021-11-13: qty 1

## 2021-11-13 MED ORDER — ACETAMINOPHEN 500 MG PO TABS
1000.0000 mg | ORAL_TABLET | Freq: Once | ORAL | Status: AC
  Administered 2021-11-13: 1000 mg via ORAL
  Filled 2021-11-13: qty 2

## 2021-11-13 MED ORDER — PHENYLEPHRINE 80 MCG/ML (10ML) SYRINGE FOR IV PUSH (FOR BLOOD PRESSURE SUPPORT)
PREFILLED_SYRINGE | INTRAVENOUS | Status: AC
  Filled 2021-11-13: qty 10

## 2021-11-13 MED ORDER — ROPIVACAINE HCL 7.5 MG/ML IJ SOLN
INTRAMUSCULAR | Status: DC | PRN
  Administered 2021-11-13: 20 mL via PERINEURAL

## 2021-11-13 MED ORDER — DIPHENHYDRAMINE HCL 12.5 MG/5ML PO ELIX: 12.5000 mg | ORAL_SOLUTION | ORAL | Status: DC | PRN

## 2021-11-13 MED ORDER — POVIDONE-IODINE 10 % EX SWAB
2.0000 "application " | Freq: Once | CUTANEOUS | Status: AC
  Administered 2021-11-13: 2 via TOPICAL

## 2021-11-13 MED ORDER — MIDAZOLAM HCL 2 MG/2ML IJ SOLN
INTRAMUSCULAR | Status: AC
  Filled 2021-11-13: qty 2

## 2021-11-13 MED ORDER — ACETAMINOPHEN 325 MG PO TABS: 325.0000 mg | ORAL_TABLET | Freq: Four times a day (QID) | ORAL | Status: DC | PRN

## 2021-11-13 MED ORDER — ORAL CARE MOUTH RINSE: 15.0000 mL | Freq: Once | OROMUCOSAL | Status: AC

## 2021-11-13 MED ORDER — DOCUSATE SODIUM 100 MG PO CAPS
100.0000 mg | ORAL_CAPSULE | Freq: Two times a day (BID) | ORAL | Status: DC
  Administered 2021-11-13 – 2021-11-16 (×6): 100 mg via ORAL
  Filled 2021-11-13 (×6): qty 1

## 2021-11-13 MED ORDER — TRAMADOL HCL 50 MG PO TABS
50.0000 mg | ORAL_TABLET | Freq: Four times a day (QID) | ORAL | Status: DC
  Administered 2021-11-13 – 2021-11-16 (×10): 50 mg via ORAL
  Filled 2021-11-13 (×10): qty 1

## 2021-11-13 MED ORDER — OXYCODONE HCL 5 MG PO TABS
ORAL_TABLET | ORAL | Status: AC
  Administered 2021-11-13: 5 mg via ORAL
  Filled 2021-11-13: qty 1

## 2021-11-13 MED ORDER — GABAPENTIN 400 MG PO CAPS
800.0000 mg | ORAL_CAPSULE | Freq: Three times a day (TID) | ORAL | Status: DC
  Administered 2021-11-13 – 2021-11-16 (×8): 800 mg via ORAL
  Filled 2021-11-13 (×8): qty 2

## 2021-11-13 MED ORDER — ALUM & MAG HYDROXIDE-SIMETH 200-200-20 MG/5ML PO SUSP: 30.0000 mL | ORAL | Status: DC | PRN

## 2021-11-13 MED ORDER — CEFAZOLIN SODIUM-DEXTROSE 2-4 GM/100ML-% IV SOLN
2.0000 g | Freq: Four times a day (QID) | INTRAVENOUS | Status: AC
  Administered 2021-11-13: 2 g via INTRAVENOUS
  Filled 2021-11-13: qty 100

## 2021-11-13 MED ORDER — OXYCODONE HCL 5 MG PO TABS
10.0000 mg | ORAL_TABLET | ORAL | Status: DC | PRN
  Administered 2021-11-13: 15 mg via ORAL
  Administered 2021-11-13: 10 mg via ORAL
  Administered 2021-11-14 (×4): 15 mg via ORAL
  Filled 2021-11-13 (×5): qty 3

## 2021-11-13 MED ORDER — POVIDONE-IODINE 10 % EX SWAB: 2.0000 "application " | Freq: Once | CUTANEOUS | Status: DC

## 2021-11-13 MED ORDER — OXYCODONE HCL 5 MG PO TABS: 5.0000 mg | ORAL_TABLET | Freq: Four times a day (QID) | ORAL | 0 refills | Status: DC | PRN

## 2021-11-13 MED ORDER — LACTATED RINGERS IV BOLUS
500.0000 mL | Freq: Once | INTRAVENOUS | Status: AC
  Administered 2021-11-13: 500 mL via INTRAVENOUS

## 2021-11-13 MED ORDER — OXYBUTYNIN CHLORIDE ER 5 MG PO TB24
5.0000 mg | ORAL_TABLET | Freq: Every day | ORAL | Status: DC
  Administered 2021-11-13 – 2021-11-15 (×3): 5 mg via ORAL
  Filled 2021-11-13 (×3): qty 1

## 2021-11-13 MED ORDER — TRANEXAMIC ACID-NACL 1000-0.7 MG/100ML-% IV SOLN
1000.0000 mg | Freq: Once | INTRAVENOUS | Status: AC
  Administered 2021-11-13: 1000 mg via INTRAVENOUS

## 2021-11-13 MED ORDER — FENTANYL CITRATE (PF) 100 MCG/2ML IJ SOLN
INTRAMUSCULAR | Status: DC | PRN
  Administered 2021-11-13 (×2): 50 ug via INTRAVENOUS

## 2021-11-13 MED ORDER — POLYETHYLENE GLYCOL 3350 17 G PO PACK: 17.0000 g | PACK | Freq: Every day | ORAL | Status: DC | PRN

## 2021-11-13 MED ORDER — MENTHOL 3 MG MT LOZG: 1.0000 | LOZENGE | OROMUCOSAL | Status: DC | PRN

## 2021-11-13 MED ORDER — METOCLOPRAMIDE HCL 5 MG PO TABS: 5.0000 mg | ORAL_TABLET | Freq: Three times a day (TID) | ORAL | Status: DC | PRN

## 2021-11-13 MED ORDER — METHOCARBAMOL 500 MG IVPB - SIMPLE MED
INTRAVENOUS | Status: AC
  Filled 2021-11-13: qty 50

## 2021-11-13 MED ORDER — LEVOTHYROXINE SODIUM 112 MCG PO TABS
112.0000 ug | ORAL_TABLET | Freq: Every day | ORAL | Status: DC
  Administered 2021-11-14 – 2021-11-16 (×3): 112 ug via ORAL
  Filled 2021-11-13 (×3): qty 1

## 2021-11-13 MED ORDER — TRAMADOL HCL 50 MG PO TABS
ORAL_TABLET | ORAL | Status: AC
  Filled 2021-11-13: qty 1

## 2021-11-13 MED ORDER — ACETAMINOPHEN 500 MG PO TABS: 1000.0000 mg | ORAL_TABLET | Freq: Once | ORAL | Status: DC

## 2021-11-13 MED ORDER — SODIUM CHLORIDE 0.9% FLUSH
INTRAVENOUS | Status: DC | PRN
  Administered 2021-11-13: 30 mL via INTRAVENOUS

## 2021-11-13 MED ORDER — PANTOPRAZOLE SODIUM 40 MG PO TBEC
40.0000 mg | DELAYED_RELEASE_TABLET | Freq: Every day | ORAL | Status: DC
  Administered 2021-11-14 – 2021-11-16 (×3): 40 mg via ORAL
  Filled 2021-11-13 (×3): qty 1

## 2021-11-13 MED ORDER — METHOCARBAMOL 500 MG IVPB - SIMPLE MED
500.0000 mg | Freq: Four times a day (QID) | INTRAVENOUS | Status: DC | PRN
Start: 2021-11-13 — End: 2021-11-16
  Administered 2021-11-13 (×2): 500 mg via INTRAVENOUS

## 2021-11-13 MED ORDER — TRAZODONE HCL 100 MG PO TABS
200.0000 mg | ORAL_TABLET | Freq: Every day | ORAL | Status: DC
  Administered 2021-11-13 – 2021-11-15 (×3): 200 mg via ORAL
  Filled 2021-11-13 (×3): qty 2

## 2021-11-13 MED ORDER — DEXAMETHASONE SODIUM PHOSPHATE 10 MG/ML IJ SOLN
INTRAMUSCULAR | Status: AC
  Filled 2021-11-13: qty 1

## 2021-11-13 MED ORDER — PHENYLEPHRINE HCL-NACL 20-0.9 MG/250ML-% IV SOLN
INTRAVENOUS | Status: AC
  Filled 2021-11-13: qty 500

## 2021-11-13 MED ORDER — RIVAROXABAN 10 MG PO TABS
10.0000 mg | ORAL_TABLET | Freq: Every day | ORAL | Status: DC
  Administered 2021-11-14 – 2021-11-16 (×3): 10 mg via ORAL
  Filled 2021-11-13 (×3): qty 1

## 2021-11-13 MED ORDER — METHOCARBAMOL 500 MG PO TABS
500.0000 mg | ORAL_TABLET | Freq: Four times a day (QID) | ORAL | Status: DC | PRN
  Administered 2021-11-14 – 2021-11-15 (×3): 500 mg via ORAL
  Filled 2021-11-13 (×3): qty 1

## 2021-11-13 MED ORDER — PROPOFOL 1000 MG/100ML IV EMUL
INTRAVENOUS | Status: AC
  Filled 2021-11-13: qty 100

## 2021-11-13 MED ORDER — 0.9 % SODIUM CHLORIDE (POUR BTL) OPTIME
TOPICAL | Status: DC | PRN
  Administered 2021-11-13: 1000 mL

## 2021-11-13 MED ORDER — BUPIVACAINE LIPOSOME 1.3 % IJ SUSP
INTRAMUSCULAR | Status: DC | PRN
  Administered 2021-11-13: 20 mL

## 2021-11-13 MED ORDER — ONDANSETRON HCL 4 MG/2ML IJ SOLN
INTRAMUSCULAR | Status: DC | PRN
  Administered 2021-11-13: 4 mg via INTRAVENOUS

## 2021-11-13 MED ORDER — BISOPROLOL-HYDROCHLOROTHIAZIDE 10-6.25 MG PO TABS
1.0000 | ORAL_TABLET | Freq: Every day | ORAL | Status: DC
  Administered 2021-11-14 – 2021-11-16 (×3): 1 via ORAL
  Filled 2021-11-13 (×3): qty 1

## 2021-11-13 MED ORDER — TRANEXAMIC ACID-NACL 1000-0.7 MG/100ML-% IV SOLN
INTRAVENOUS | Status: AC
  Filled 2021-11-13: qty 100

## 2021-11-13 MED ORDER — BUPIVACAINE-EPINEPHRINE (PF) 0.25% -1:200000 IJ SOLN
INTRAMUSCULAR | Status: AC
  Filled 2021-11-13: qty 30

## 2021-11-13 MED ORDER — WATER FOR IRRIGATION, STERILE IR SOLN
Status: DC | PRN
  Administered 2021-11-13: 2000 mL

## 2021-11-13 MED ORDER — FENTANYL CITRATE PF 50 MCG/ML IJ SOSY
25.0000 ug | PREFILLED_SYRINGE | INTRAMUSCULAR | Status: DC | PRN
  Administered 2021-11-13 (×2): 50 ug via INTRAVENOUS

## 2021-11-13 MED ORDER — ACETAMINOPHEN 500 MG PO TABS: 1000.0000 mg | ORAL_TABLET | Freq: Four times a day (QID) | ORAL | 0 refills | Status: AC | PRN

## 2021-11-13 MED ORDER — ONDANSETRON HCL 4 MG/2ML IJ SOLN
INTRAMUSCULAR | Status: AC
  Filled 2021-11-13: qty 2

## 2021-11-13 MED ORDER — BUPIVACAINE-EPINEPHRINE 0.25% -1:200000 IJ SOLN
INTRAMUSCULAR | Status: DC | PRN
  Administered 2021-11-13: 30 mL

## 2021-11-13 MED ORDER — METFORMIN HCL 500 MG PO TABS
1000.0000 mg | ORAL_TABLET | Freq: Every day | ORAL | Status: DC
  Administered 2021-11-14 – 2021-11-16 (×3): 1000 mg via ORAL
  Filled 2021-11-13 (×3): qty 2

## 2021-11-13 MED ORDER — METHOCARBAMOL 750 MG PO TABS: 750.0000 mg | ORAL_TABLET | Freq: Three times a day (TID) | ORAL | 0 refills | Status: DC | PRN

## 2021-11-13 MED ORDER — BUPIVACAINE IN DEXTROSE 0.75-8.25 % IT SOLN
INTRATHECAL | Status: DC | PRN
  Administered 2021-11-13: 1.8 mL via INTRATHECAL

## 2021-11-13 MED ORDER — ACETAMINOPHEN 325 MG PO TABS
ORAL_TABLET | ORAL | Status: AC
  Filled 2021-11-13: qty 1

## 2021-11-13 MED ORDER — OXYCODONE HCL 5 MG/5ML PO SOLN: 5.0000 mg | Freq: Once | ORAL | Status: AC | PRN

## 2021-11-13 MED ORDER — SODIUM CHLORIDE 0.9 % IR SOLN
Status: DC | PRN
Start: 2021-11-13 — End: 2021-11-13
  Administered 2021-11-13: 1000 mL

## 2021-11-13 MED ORDER — DEXAMETHASONE SODIUM PHOSPHATE 10 MG/ML IJ SOLN
10.0000 mg | Freq: Once | INTRAMUSCULAR | Status: AC
  Administered 2021-11-14: 10 mg via INTRAVENOUS
  Filled 2021-11-13: qty 1

## 2021-11-13 SURGICAL SUPPLY — 51 items
BAG COUNTER SPONGE SURGICOUNT (BAG) IMPLANT
BLADE HEX COATED 2.75 (ELECTRODE) ×2 IMPLANT
BLADE SAG 18X100X1.27 (BLADE) ×2 IMPLANT
BLADE SAGITTAL 25.0X1.37X90 (BLADE) ×2 IMPLANT
BLADE SURG 15 STRL LF DISP TIS (BLADE) ×1 IMPLANT
BLADE SURG 15 STRL SS (BLADE) ×2
BLADE SURG SZ10 CARB STEEL (BLADE) ×4 IMPLANT
BNDG ELASTIC 6X10 VLCR STRL LF (GAUZE/BANDAGES/DRESSINGS) ×2 IMPLANT
BOWL SMART MIX CTS (DISPOSABLE) IMPLANT
CLSR STERI-STRIP ANTIMIC 1/2X4 (GAUZE/BANDAGES/DRESSINGS) ×2 IMPLANT
COMPONENT TRI CR RETAIN KNEE (Orthopedic Implant) IMPLANT
COVER SURGICAL LIGHT HANDLE (MISCELLANEOUS) ×2 IMPLANT
CUFF TOURN SGL QUICK 34 (TOURNIQUET CUFF) ×2
CUFF TRNQT CYL 34X4.125X (TOURNIQUET CUFF) ×1 IMPLANT
DRAPE INCISE IOBAN 66X45 STRL (DRAPES) ×2 IMPLANT
DRAPE U-SHAPE 47X51 STRL (DRAPES) ×2 IMPLANT
DRSG MEPILEX BORDER 4X12 (GAUZE/BANDAGES/DRESSINGS) ×2 IMPLANT
DURAPREP 26ML APPLICATOR (WOUND CARE) ×4 IMPLANT
GLOVE BIO SURGEON STRL SZ7.5 (GLOVE) ×4 IMPLANT
GLOVE BIOGEL PI IND STRL 7.5 (GLOVE) ×1 IMPLANT
GLOVE BIOGEL PI IND STRL 8 (GLOVE) ×1 IMPLANT
GLOVE BIOGEL PI INDICATOR 7.5 (GLOVE) ×1
GLOVE BIOGEL PI INDICATOR 8 (GLOVE) ×1
GLOVE SURG POLYISO LF SZ7.5 (GLOVE) ×2 IMPLANT
GOWN STRL REUS W/ TWL LRG LVL3 (GOWN DISPOSABLE) ×1 IMPLANT
GOWN STRL REUS W/TWL LRG LVL3 (GOWN DISPOSABLE) ×2
HANDPIECE INTERPULSE COAX TIP (DISPOSABLE) ×2
HOLDER FOLEY CATH W/STRAP (MISCELLANEOUS) IMPLANT
IMMOBILIZER KNEE 20 (SOFTGOODS) ×2
IMMOBILIZER KNEE 20 THIGH 36 (SOFTGOODS) IMPLANT
IMMOBILIZER KNEE 22 UNIV (SOFTGOODS) ×2 IMPLANT
INSERT TIB CS TRIATH X3 9 (Insert) ×1 IMPLANT
KNEE PATELLA ASYMMETRIC 9X29 (Knees) ×1 IMPLANT
KNEE TIBIAL COMPONENT SZ3 (Knees) ×1 IMPLANT
MANIFOLD NEPTUNE II (INSTRUMENTS) ×2 IMPLANT
NS IRRIG 1000ML POUR BTL (IV SOLUTION) ×2 IMPLANT
PACK TOTAL KNEE CUSTOM (KITS) ×2 IMPLANT
PIN FLUTED HEDLESS FIX 3.5X1/8 (PIN) ×1 IMPLANT
PROTECTOR NERVE ULNAR (MISCELLANEOUS) ×2 IMPLANT
SET HNDPC FAN SPRY TIP SCT (DISPOSABLE) ×1 IMPLANT
SPIKE FLUID TRANSFER (MISCELLANEOUS) ×2 IMPLANT
SUT MNCRL AB 3-0 PS2 18 (SUTURE) ×2 IMPLANT
SUT VIC AB 0 CT1 36 (SUTURE) ×2 IMPLANT
SUT VIC AB 1 CT1 36 (SUTURE) ×4 IMPLANT
SUT VIC AB 2-0 CT1 27 (SUTURE) ×2
SUT VIC AB 2-0 CT1 TAPERPNT 27 (SUTURE) ×1 IMPLANT
TRAY FOLEY MTR SLVR 14FR STAT (SET/KITS/TRAYS/PACK) IMPLANT
TRAY FOLEY MTR SLVR 16FR STAT (SET/KITS/TRAYS/PACK) IMPLANT
TRIA CRUCIATE RETAIN KNEE (Orthopedic Implant) ×2 IMPLANT
TUBE SUCTION HIGH CAP CLEAR NV (SUCTIONS) ×2 IMPLANT
WRAP KNEE MAXI GEL POST OP (GAUZE/BANDAGES/DRESSINGS) ×2 IMPLANT

## 2021-11-13 NOTE — Transfer of Care (Signed)
Immediate Anesthesia Transfer of Care Note ? ?Patient: Alexandra [name] ? ?Procedure(s) Performed: TOTAL KNEE ARTHROPLASTY (Right: Knee) ? ?Patient Location: PACU ? ?Anesthesia Type:Spinal and MAC combined with regional for post-op pain ? ?Level of Consciousness: drowsy ? ?Airway & Oxygen Therapy: Patient Spontanous Breathing and Patient connected to face mask oxygen ? ?Post-op Assessment: Report given to RN and Post -op Vital signs reviewed and stable ? ?Post vital signs: Reviewed and stable ? ?Last Vitals:  ?Vitals Value Taken Time  ?BP 124/62 11/13/21 0927  ?Temp    ?Pulse 82 11/13/21 0929  ?Resp 10 11/13/21 0929  ?SpO2 100 % 11/13/21 0929  ?Vitals shown include unvalidated device data. ? ?Last Pain:  ?Vitals:  ? 11/13/21 0601  ?TempSrc: Oral  ?PainSc:   ?   ? ?  ? ?Complications: No notable events documented. ?

## 2021-11-13 NOTE — Evaluation (Signed)
Physical Therapy Evaluation ?Patient Details ?Name: Alexandra [name] ?MRN: [id number] ?DOB: Nov 27, 1960 ?Today's Date: 11/13/2021 ? ?History of Present Illness ? 60 yo female s/p RTKA 11/13/21. PMH RRCR,TIA,Bacj sg  ?Clinical Impression ? The patient  reports numbness in buttock on right and foot tingles. Assisted to [facility] after standing at RW with instability ,mod assist for balance. Patient will need a second visit from PT. Pt admitted with above diagnosis.  Pt currently with functional limitations due to the deficits listed below (see PT Problem List). Pt will benefit from skilled PT to increase their independence and safety with mobility to allow discharge to the venue listed below.   ?   ?   ? ?Recommendations for follow up therapy are one component of a multi-disciplinary discharge planning process, led by the attending physician.  Recommendations may be updated based on patient status, additional functional criteria and insurance authorization. ? ?Follow Up Recommendations Follow physician's recommendations for discharge plan and follow up therapies ? ?  ?Assistance Recommended at Discharge Frequent or constant Supervision/Assistance  ?Patient can return home with the following ? Help with stairs or ramp for entrance;A little help with bathing/dressing/bathroom;A little help with walking and/or transfers;Assistance with cooking/housework;Assist for transportation ? ?  ?Equipment Recommendations None recommended by PT  ?Recommendations for Other Services ?    ?  ?Functional Status Assessment Patient has had a recent decline in their functional status and demonstrates the ability to make significant improvements in function in a reasonable and predictable amount of time.  ? ?  ?Precautions / Restrictions Precautions ?Precautions: Fall;Knee ?Required Braces or Orthoses: Knee Immobilizer - Right ?Knee Immobilizer - Right: Discontinue once straight leg raise with < 10 degree lag  ? ?  ? ?Mobility ? Bed  Mobility ?Overal bed mobility: Needs Assistance ?Bed Mobility: Supine to Sit, Sit to Supine ?  ?  ?Supine to sit: Mod assist ?Sit to supine: Mod assist ?  ?General bed mobility comments: assist with right leg ?  ? ?Transfers ?Overall transfer level: Needs assistance ?Equipment used: Rolling walker (2 wheels) ?Transfers: Sit to/from Stand, Bed to chair/wheelchair/BSC ?Sit to Stand: Mod assist ?  ?Step pivot transfers: Mod assist ?  ?  ?  ?General transfer comment: stood at bed edge, has urge for urination.  Right leg weakness noted even with KI.  Stood and assisted to pivot to [facility] with min assist, KI in place. Unable to void, assisted back onto stretcher. ?  ? ?Ambulation/Gait ?  ?  ?  ?  ?  ?  ?  ?  ? ?Stairs ?  ?  ?  ?  ?  ? ?Wheelchair Mobility ?  ? ?Modified Rankin (Stroke Patients Only) ?  ? ?  ? ?Balance Overall balance assessment: Needs assistance ?Sitting-balance support: Bilateral upper extremity supported, Feet supported ?Sitting balance-Leahy Scale: Fair ?  ?  ?Standing balance support: During functional activity, Bilateral upper extremity supported, Reliant on assistive device for balance ?Standing balance-Leahy Scale: Poor ?Standing balance comment: unsteady with WB on  right, decreased support on right leg ?  ?  ?  ?  ?  ?  ?  ?  ?  ?  ?  ?   ? ? ? ?Pertinent Vitals/Pain Pain Assessment ?Pain Assessment: 0-10 ?Pain Score: 6  ?Pain Location: r knee ?Pain Descriptors / Indicators: Aching, Discomfort, Grimacing ?Pain Intervention(s): Repositioned, Premedicated before session, Monitored during session, Limited activity within patient's tolerance, Ice applied  ? ? ?Home Living Family/patient expects to be discharged  to:: Private residence ?Living Arrangements: Spouse/significant other;Other relatives ?Available Help at Discharge: Family;Available 24 hours/day ?Type of Home: House ?Home Access: Stairs to enter ?Entrance Stairs-Rails: Right;Left ?  ?  ?Home Layout: One level ?Home Equipment: BSC/3in1;Rolling  Walker (2 wheels) ?   ?  ?Prior Function Prior Level of Function : Independent/Modified Independent ?  ?  ?  ?  ?  ?  ?  ?  ?  ? ? ?Hand Dominance  ? Dominant Hand: Right ? ?  ?Extremity/Trunk Assessment  ? Upper Extremity Assessment ?Upper Extremity Assessment: Overall WFL for tasks assessed ?  ? ?Lower Extremity Assessment ?Lower Extremity Assessment: RLE deficits/detail ?RLE Deficits / Details: numbness in buttocks, SLR with a lag ?RLE Sensation: decreased light touch ?  ? ?Cervical / Trunk Assessment ?Cervical / Trunk Assessment: Normal  ?Communication  ? Communication: No difficulties  ?Cognition Arousal/Alertness: Awake/alert ?Behavior During Therapy: [facility] for tasks assessed/performed ?Overall Cognitive Status: Within Functional Limits for tasks assessed ?  ?  ?  ?  ?  ?  ?  ?  ?  ?  ?  ?  ?  ?  ?  ?  ?  ?  ?  ? ?  ?General Comments   ? ?  ?Exercises Total Joint Exercises ?Ankle Circles/Pumps: AROM, Both, 10 reps ?Quad Sets: AROM, Both, 10 reps ?Hip ABduction/ADduction: AAROM, Right, 10 reps ?Straight Leg Raises: AAROM, Right, 10 reps  ? ?Assessment/Plan  ?  ?PT Assessment Patient needs continued PT services  ?PT Problem List Decreased strength;Decreased mobility;Decreased safety awareness;Decreased range of motion;Decreased knowledge of precautions;Decreased activity tolerance;Decreased balance;Pain;Decreased knowledge of use of DME ? ?   ?  ?PT Treatment Interventions DME instruction;Therapeutic activities;Gait training;Therapeutic exercise;Patient/family education;Stair training;Functional mobility training   ? ?PT Goals (Current goals can be found in the Care Plan section)  ?Acute Rehab PT Goals ?Patient Stated Goal: to go home ?PT Goal Formulation: With patient ?Time For Goal Achievement: 11/20/21 ?Potential to Achieve Goals: Good ? ?  ?Frequency 7X/week ?  ? ? ?Co-evaluation   ?  ?  ?  ?  ? ? ?  ?AM-PAC PT "6 Clicks" Mobility  ?Outcome Measure Help needed turning from your back to your side while in a  flat bed without using bedrails?: A Lot ?Help needed moving from lying on your back to sitting on the side of a flat bed without using bedrails?: A Lot ?Help needed moving to and from a bed to a chair (including a wheelchair)?: A Lot ?Help needed standing up from a chair using your arms (e.g., wheelchair or bedside chair)?: A Lot ?Help needed to walk in hospital room?: Total ?Help needed climbing 3-5 steps with a railing? : Total ?6 Click Score: 10 ? ?  ?End of Session Equipment Utilized During Treatment: Gait belt ?Activity Tolerance: Patient limited by pain;Treatment limited secondary to medical complications (Comment) ?Patient left: in bed;with call bell/phone within reach ?Nurse Communication: Mobility status ?PT Visit Diagnosis: Unsteadiness on feet (R26.81);Other abnormalities of gait and mobility (R26.89);Other symptoms and signs involving the nervous system (R29.898) ?  ? ?Time: 4098-1191 ?PT Time Calculation (min) (ACUTE ONLY): 27 min ? ? ?Charges:   PT Evaluation ?$PT Eval Low Complexity: 1 Low ?PT Treatments ?$Therapeutic Activity: 8-22 mins ?  ?   ? ? ?Blanchard Kelch PT ?Acute Rehabilitation Services ?Pager [phone] ?Office [phone] ? ? ?Isys Tietje, Jobe Igo ?11/13/2021, 4:02 PM ?

## 2021-11-13 NOTE — Discharge Instructions (Addendum)
POST-OPERATIVE OPIOID TAPER INSTRUCTIONS: ?It is important to wean off of your opioid medication as soon as possible. If you do not need pain medication after your surgery it is ok to stop day one. ?Opioids include: ?Codeine, Hydrocodone(Norco, Vicodin), Oxycodone(Percocet, oxycontin) and hydromorphone amongst others.  ?Long term and even short term use of opiods can cause: ?Increased pain response ?Dependence ?Constipation ?Depression ?Respiratory depression ?And more.  ?Withdrawal symptoms can include ?Flu like symptoms ?Nausea, vomiting ?And more ?Techniques to manage these symptoms ?Hydrate well ?Eat regular healthy meals ?Stay active ?Use relaxation techniques(deep breathing, meditating, yoga) ?Do Not substitute Alcohol to help with tapering ?If you have been on opioids for less than two weeks and do not have pain than it is ok to stop all together.  ?Plan to wean off of opioids ?This plan should start within one week post op of your joint replacement. ?Maintain the same interval or time between taking each dose and first decrease the dose.  ?Cut the total daily intake of opioids by one tablet each day ?Next start to increase the time between doses. ?The last dose that should be eliminated is the evening dose.   ? ? ?Information on my medicine - XARELTO? (Rivaroxaban) ? ?This medication education was reviewed with me or my healthcare representative as part of my discharge preparation.  The pharmacist that spoke with me during my hospital stay was:   ? ?Why was Xarelto? prescribed for you? ?Xarelto? was prescribed for you to reduce the risk of blood clots forming after orthopedic surgery. The medical term for these abnormal blood clots is venous thromboembolism (VTE). ? ?What do you need to know about xarelto? ? ?Take your Xarelto? ONCE DAILY at the same time every day. ?You may take it either with or without food. ? ?If you have difficulty swallowing the tablet whole, you may crush it and mix in applesauce just  prior to taking your dose. ? ?Take Xarelto? exactly as prescribed by your doctor and DO NOT stop taking Xarelto? without talking to the doctor who prescribed the medication.  Stopping without other VTE prevention medication to take the place of Xarelto? may increase your risk of developing a clot. ? ?After discharge, you should have regular check-up appointments with your healthcare provider that is prescribing your Xarelto?.   ? ?What do you do if you miss a dose? ?If you miss a dose, take it as soon as you remember on the same day then continue your regularly scheduled once daily regimen the next day. Do not take two doses of Xarelto? on the same day.  ? ?Important Safety Information ?A possible side effect of Xarelto? is bleeding. You should call your healthcare provider right away if you experience any of the following: ?Bleeding from an injury or your nose that does not stop. ?Unusual colored urine (red or dark brown) or unusual colored stools (red or black). ?Unusual bruising for unknown reasons. ?A serious fall or if you hit your head (even if there is no bleeding). ? ?Some medicines may interact with Xarelto? and might increase your risk of bleeding while on Xarelto?Marland Kitchen To help avoid this, consult your healthcare provider or pharmacist prior to using any new prescription or non-prescription medications, including herbals, vitamins, non-steroidal anti-inflammatory drugs (NSAIDs) and supplements. ? ?This website has more information on Xarelto?: [email or web address]-benson.[email or web address] ? ? ?

## 2021-11-13 NOTE — Op Note (Signed)
DATE OF SURGERY:  11/13/2021 ?TIME: 9:06 AM ? ?PATIENT NAME:  Alexandra [name]   ?AGE: 61 y.o.  ? ? ?PRE-OPERATIVE DIAGNOSIS:  OA RIGHT KNEE ? ?POST-OPERATIVE DIAGNOSIS:  Same ? ?PROCEDURE:  Procedure(s): ?TOTAL KNEE ARTHROPLASTY ? ? ?SURGEON:  Renette Butters, MD  ? ?ASSISTANTAggie Moats, PA-C, he was present and scrubbed throughout the case, critical for completion in a timely fashion, and for retraction, instrumentation, and closure. ? ? ? ?OPERATIVE IMPLANTS: [employer] Triathlon CR. Press fit knee  Femur size 3, Tibia size 3, Patella size 29 3-peg oval button, with a 9 mm polyethylene insert. ? ? ?PREOPERATIVE INDICATIONS: ? ?Elke Omara Alcon is a 61 y.o. year old female with end stage bone on bone degenerative arthritis of the knee who failed conservative treatment, including injections, antiinflammatories, activity modification, and assistive devices, and had significant impairment of their activities of daily living, and elected for Total Knee Arthroplasty.  ? ?The risks, benefits, and alternatives were discussed at length including but not limited to the risks of infection, bleeding, nerve injury, stiffness, blood clots, the need for revision surgery, cardiopulmonary complications, among others, and they were willing to proceed. ? ? ?OPERATIVE DESCRIPTION: ? ?The patient was brought to the operative room and placed in a supine position.  General anesthesia was administered.  IV antibiotics were given.  The lower extremity was prepped and draped in the usual sterile fashion.  Time out was performed.  The leg was elevated and exsanguinated and the tourniquet was inflated. ? ?Anterior approach was performed.  The patella was everted and osteophytes were removed.  The anterior horn of the medial and lateral meniscus was removed.  ? ?The distal femur was opened with the drill and the intramedullary distal femoral cutting jig was utilized, set at 5 degrees resecting 8 mm off the distal femur.  Care was  taken to protect the collateral ligaments. ? ?The distal femoral sizing jig was applied, taking care to avoid notching.  Then the 4-in-1 cutting jig was applied and the anterior and posterior femur was cut, along with the chamfer cuts.  All posterior osteophytes were removed.  The flexion gap was then measured and was symmetric with the extension gap. ? ?Then the extramedullary tibial cutting jig was utilized making the appropriate cut using the anterior tibial crest as a reference building in appropriate posterior slope.  Care was taken during the cut to protect the medial and collateral ligaments.  The proximal tibia was removed along with the posterior horns of the menisci.  The PCL was sacrificed.   ? ?The extensor gap was measured and was approximately 27m.   ? ?I completed the distal femoral preparation using the appropriate jig to prepare the box. ? ?The patella was then measured, and cut with the saw.   ? ?The proximal tibia sized and prepared accordingly with the reamer and the punch, and then all components were trialed with the above sized poly insert.  The knee was found to have excellent balance and full motion.   ? ?The above named components were then impacted into place and Poly tibial piece and patella were inserted. ? ?I was very happy with his stability and ROM ? ?I performed a periarticular injection with marcaine and toradol ? ?The knee was easily taken through a range of motion and the patella tracked well and the knee irrigated copiously and the parapatellar and subcutaneous tissue closed with vicryl, and monocryl with steri strips for the skin.  The  incision was dressed with sterile gauze and the tourniquet released and the patient was awakened and returned to the PACU in stable and satisfactory condition.  There were no complications. ? ?Total tourniquet time was roughly 60 minutes. ? ? ?POSTOPERATIVE PLAN: post op Abx, DVT px: SCD's, TED's, Early ambulation and chemical px ? ? ? ?

## 2021-11-13 NOTE — Interval H&P Note (Signed)
History and Physical Interval Note: ? ?11/13/2021 ?7:26 AM ? ?Alexandra [name]  has presented today for surgery, with the diagnosis of OA RIGHT KNEE.  The various methods of treatment have been discussed with the patient and family. After consideration of risks, benefits and other options for treatment, the patient has consented to  Procedure(s): ?TOTAL KNEE ARTHROPLASTY (Right) as a surgical intervention.  The patient's history has been reviewed, patient examined, no change in status, stable for surgery.  I have reviewed the patient's chart and labs.  Questions were answered to the patient's satisfaction.   ? ? ?Renette Butters ? ? ?

## 2021-11-13 NOTE — Anesthesia Procedure Notes (Signed)
Procedure Name: Lee ?Date/Time: 11/13/2021 7:36 AM ?Performed by: West Pugh, CRNA ?Pre-anesthesia Checklist: Patient identified, Emergency Drugs available, Suction available, Patient being monitored and Timeout performed ?Patient Re-evaluated:Patient Re-evaluated prior to induction ?Oxygen Delivery Method: Simple face mask ?Preoxygenation: Pre-oxygenation with 100% oxygen ?Induction Type: IV induction ?Placement Confirmation: positive ETCO2 ?Dental Injury: Teeth and Oropharynx as per pre-operative assessment  ? ? ? ? ?

## 2021-11-13 NOTE — Progress Notes (Signed)
Physical Therapy Treatment ?Patient Details ?Name: Alexandra [name] ?MRN: [id number] ?DOB: 10-14-1960 ?Today's Date: 11/13/2021 ? ? 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 PMH:  Right RCR,TIA,Back sg ? ?  ?PT Comments  ? ? Patient  ambulated x 50' slowly with KI. Patient incontinent and did not use KI for another 28' after attempted toileting. Patient unable to void. Patient unable to attempt steps at this time. Patient has 5 STE. Patient has not  met PT goals to DC home today. RN aware.   ?Recommendations for follow up therapy are one component of a multi-disciplinary discharge planning process, led by the attending physician.  Recommendations may be updated based on patient status, additional functional criteria and insurance authorization. ? ?Follow Up Recommendations ? Follow physician's recommendations for discharge plan and follow up therapies ?  ?  ?Assistance Recommended at Discharge Frequent or constant Supervision/Assistance  ?Patient can return home with the following Help with stairs or ramp for entrance;A little help with bathing/dressing/bathroom;A little help with walking and/or transfers;Assistance with cooking/housework;Assist for transportation ?  ?Equipment Recommendations ? None recommended by PT  ?  ?Recommendations for Other Services   ? ? ?  ?Precautions / Restrictions Precautions ?Precautions: Fall;Knee ?Required Braces or Orthoses: Knee Immobilizer - Right ?Knee Immobilizer - Right: Discontinue once straight leg raise with < 10 degree lag  ?  ? ?Mobility ? Bed Mobility ?  ?General bed mobility comments: on BSC ?  ? ?Transfers ?Overall transfer level: Needs assistance ?Equipment used: Rolling walker (2 wheels) ?Transfers: Sit to/from Stand ?Sit to Stand: Min assist ?  ? ?  ?  ?  ?General transfer comment: support  right leg down to floor  from high BSC. ?  ? ?Ambulation/Gait ?Ambulation/Gait assistance: Min assist ?Gait Distance (Feet): 50 Feet (x 2) ?Assistive device:  Rolling walker (2 wheels) ?Gait Pattern/deviations: Step-through pattern, Step-to pattern, Decreased step length - right, Decreased stance time - right ?Gait velocity: der ?  ?  ?General Gait Details: cues for sequnce. Patient ambulated with KI to BR, without KI  from BR due to soiling KI from urinary incontinece. ? ? ?Stairs ?Stairs: Yes ?  ?  ?  ?General stair comments: attempted to take  a step up to step up with KI and noted to buckle on the  right  knee so did not pursue. Ordered a new KI ? ? ?Wheelchair Mobility ?  ? ?Modified Rankin (Stroke Patients Only) ?  ? ? ?  ?Balance Overall balance assessment: Needs assistance ?Sitting-balance support: Bilateral upper extremity supported, Feet supported ?Sitting balance-Leahy Scale: Fair ?  ?  ?Standing balance support: During functional activity, Bilateral upper extremity supported, Reliant on assistive device for balance ?Standing balance-Leahy Scale: Poor ?Standing balance comment: unsteady with WB on  right, decreased support on right leg ?  ?  ?  ?  ?  ?  ?  ?  ?  ?  ?  ?  ? ?  ?Cognition Arousal/Alertness: Awake/alert ?Behavior During Therapy: [facility] for tasks assessed/performed ?Overall Cognitive Status: Within Functional Limits for tasks assessed ?  ?  ?  ?  ?  ?  ?  ?  ?  ?  ?  ?  ?  ?  ?  ?  ?  ?  ?  ? ?  ?Exercises  ? ?  ?General Comments   ?  ?  ? ?Pertinent Vitals/Pain Pain Assessment ?Pain Assessment: 0-10 ?Pain Score: 6  ?Pain Location: rt knee ?Pain  Physical Therapy Treatment ?Patient Details ?Name: Alexandra [name] ?MRN: [id number] ?DOB: 07-24-1961 ?Today's Date: 11/13/2021 ? ? 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 PMH:  Right RCR,TIA,Back sg ? ?  ?PT Comments  ? ? Patient  ambulated x 50' slowly with KI. Patient incontinent and did not use KI for another 27' after attempted toileting. Patient unable to void. Patient unable to attempt steps at this time. Patient has 5 STE. Patient has not  met PT goals to DC home today. RN aware.   ?Recommendations for follow up therapy are one component of a multi-disciplinary discharge planning process, led by the attending physician.  Recommendations may be updated based on patient status, additional functional criteria and insurance authorization. ? ?Follow Up Recommendations ? Follow physician's recommendations for discharge plan and follow up therapies ?  ?  ?Assistance Recommended at Discharge Frequent or constant Supervision/Assistance  ?Patient can return home with the following Help with stairs or ramp for entrance;A little help with bathing/dressing/bathroom;A little help with walking and/or transfers;Assistance with cooking/housework;Assist for transportation ?  ?Equipment Recommendations ? None recommended by PT  ?  ?Recommendations for Other Services   ? ? ?  ?Precautions / Restrictions Precautions ?Precautions: Fall;Knee ?Required Braces or Orthoses: Knee Immobilizer - Right ?Knee Immobilizer - Right: Discontinue once straight leg raise with < 10 degree lag  ?  ? ?Mobility ? Bed Mobility ?  ?General bed mobility comments: on BSC ?  ? ?Transfers ?Overall transfer level: Needs assistance ?Equipment used: Rolling walker (2 wheels) ?Transfers: Sit to/from Stand ?Sit to Stand: Min assist ?  ? ?  ?  ?  ?General transfer comment: support  right leg down to floor  from high BSC. ?  ? ?Ambulation/Gait ?Ambulation/Gait assistance: Min assist ?Gait Distance (Feet): 50 Feet (x 2) ?Assistive device:  Rolling walker (2 wheels) ?Gait Pattern/deviations: Step-through pattern, Step-to pattern, Decreased step length - right, Decreased stance time - right ?Gait velocity: der ?  ?  ?General Gait Details: cues for sequnce. Patient ambulated with KI to BR, without KI  from BR due to soiling KI from urinary incontinece. ? ? ?Stairs ?Stairs: Yes ?  ?  ?  ?General stair comments: attempted to take  a step up to step up with KI and noted to buckle on the  right  knee so did not pursue. Ordered a new KI ? ? ?Wheelchair Mobility ?  ? ?Modified Rankin (Stroke Patients Only) ?  ? ? ?  ?Balance Overall balance assessment: Needs assistance ?Sitting-balance support: Bilateral upper extremity supported, Feet supported ?Sitting balance-Leahy Scale: Fair ?  ?  ?Standing balance support: During functional activity, Bilateral upper extremity supported, Reliant on assistive device for balance ?Standing balance-Leahy Scale: Poor ?Standing balance comment: unsteady with WB on  right, decreased support on right leg ?  ?  ?  ?  ?  ?  ?  ?  ?  ?  ?  ?  ? ?  ?Cognition Arousal/Alertness: Awake/alert ?Behavior During Therapy: [facility] for tasks assessed/performed ?Overall Cognitive Status: Within Functional Limits for tasks assessed ?  ?  ?  ?  ?  ?  ?  ?  ?  ?  ?  ?  ?  ?  ?  ?  ?  ?  ?  ? ?  ?Exercises  ? ?  ?General Comments   ?  ?  ? ?Pertinent Vitals/Pain Pain Assessment ?Pain Assessment: 0-10 ?Pain Score: 6  ?Pain Location: rt knee ?Pain

## 2021-11-13 NOTE — Progress Notes (Signed)
Orthopedic Tech Progress Note ?Patient Details:  ?Avigayil Ton ?December 28, 1960 ?[id number] ? ?Ortho Devices ?Type of Ortho Device: Bone foam zero knee ?Ortho Device/Splint Interventions: Ordered ?  ?  ? ?Charline Bills Keia Rask ?11/13/2021, 1:53 PM ?Delivered bone foam. ?

## 2021-11-13 NOTE — Anesthesia Procedure Notes (Signed)
Anesthesia Regional Block: Adductor canal block  ? ?Pre-Anesthetic Checklist: , timeout performed,  Correct Patient, Correct Site, Correct Laterality,  Correct Procedure, Correct Position, site marked,  Risks and benefits discussed,  Surgical consent,  Pre-op evaluation,  At surgeon's request and post-op pain management ? ?Laterality: Right ? ?Prep: chloraprep     ?  ?Needles:  ?Injection technique: Single-shot ? ?Needle Type: Echogenic Needle   ? ? ?Needle Length: 10cm  ?Needle Gauge: 21  ? ? ? ?Additional Needles: ? ? ?Narrative:  ?Start time: 11/13/2021 6:57 AM ?End time: 11/13/2021 7:00 AM ?Injection made incrementally with aspirations every 5 mL. ? ?Performed by: Personally  ?Anesthesiologist: Audry Pili, MD ? ?Additional Notes: ?No pain on injection. No increased resistance to injection. Injection made in 5cc increments. Good needle visualization. Patient tolerated the procedure well. ? ? ? ? ?

## 2021-11-13 NOTE — Anesthesia Postprocedure Evaluation (Signed)
Anesthesia Post Note ? ?Patient: Alexandra [name] ? ?Procedure(s) Performed: TOTAL KNEE ARTHROPLASTY (Right: Knee) ? ?  ? ?Patient location during evaluation: PACU ?Anesthesia Type: Spinal ?Level of consciousness: awake and alert ?Pain management: pain level controlled ?Vital Signs Assessment: post-procedure vital signs reviewed and stable ?Respiratory status: spontaneous breathing and respiratory function stable ?Cardiovascular status: blood pressure returned to baseline and stable ?Postop Assessment: spinal receding and no apparent nausea or vomiting ?Anesthetic complications: no ? ? ?No notable events documented. ? ?Last Vitals:  ?Vitals:  ? 11/13/21 1000 11/13/21 1015  ?BP: 140/72 (!) 154/73  ?Pulse: 81 78  ?Resp: 13 11  ?Temp:    ?SpO2: 90% 99%  ?  ?Last Pain:  ?Vitals:  ? 11/13/21 1015  ?TempSrc:   ?PainSc: 4   ? ? ?  ?  ?  ?  ?  ?  ? ?Audry Pili ? ? ? ? ?

## 2021-11-13 NOTE — Anesthesia Procedure Notes (Signed)
Spinal ? ?Patient location during procedure: OR ?Start time: 11/13/2021 7:38 AM ?End time: 11/13/2021 7:45 AM ?Reason for block: surgical anesthesia ?Staffing ?Performed: anesthesiologist  ?Anesthesiologist: Audry Pili, MD ?Preanesthetic Checklist ?Completed: patient identified, IV checked, risks and benefits discussed, surgical consent, monitors and equipment checked, pre-op evaluation and timeout performed ?Spinal Block ?Patient position: sitting ?Prep: DuraPrep ?Patient monitoring: heart rate, cardiac monitor, continuous pulse ox and blood pressure ?Approach: midline ?Location: L3-4 ?Injection technique: single-shot ?Needle ?Needle type: Quincke  ?Needle gauge: 22 G ?Additional Notes ?Consent was obtained prior to the procedure with all questions answered and concerns addressed. Risks including, but not limited to, bleeding, infection, nerve damage, paralysis, failed block, inadequate analgesia, allergic reaction, high spinal, itching, and headache were discussed and the patient wished to proceed. Functioning IV was confirmed and monitors were applied. Sterile prep and drape, including hand hygiene, mask, and sterile gloves were used. The patient was positioned and the spine was prepped. The skin was anesthetized with lidocaine. Free flow of clear CSF was obtained prior to injecting local anesthetic into the CSF. The spinal needle aspirated freely following injection. The needle was carefully withdrawn. The patient tolerated the procedure well.  ? ?Renold Don, MD ? ? ? ?

## 2021-11-14 ENCOUNTER — Other Ambulatory Visit: Payer: Self-pay

## 2021-11-14 ENCOUNTER — Encounter (HOSPITAL_COMMUNITY): Payer: Self-pay | Admitting: Orthopedic Surgery

## 2021-11-14 LAB — GLUCOSE, CAPILLARY: Glucose-Capillary: 188 mg/dL — ABNORMAL HIGH (ref 70–99)

## 2021-11-14 MED ORDER — PREDNISONE 5 MG PO TABS
10.0000 mg | ORAL_TABLET | Freq: Once | ORAL | Status: AC
  Administered 2021-11-14: 10 mg via ORAL
  Filled 2021-11-14: qty 2

## 2021-11-14 MED ORDER — HYDROMORPHONE HCL 2 MG PO TABS
2.0000 mg | ORAL_TABLET | Freq: Four times a day (QID) | ORAL | Status: DC | PRN
  Administered 2021-11-16: 2 mg via ORAL
  Filled 2021-11-14: qty 1

## 2021-11-14 MED ORDER — ACETAMINOPHEN 500 MG PO TABS
1000.0000 mg | ORAL_TABLET | Freq: Four times a day (QID) | ORAL | Status: DC
  Administered 2021-11-14 – 2021-11-16 (×7): 1000 mg via ORAL
  Filled 2021-11-14 (×7): qty 2

## 2021-11-14 NOTE — Progress Notes (Signed)
Physical Therapy Treatment ?Patient Details ?Name: Alexandra [name] ?MRN: [id number] ?DOB: Dec 21, 1960 ?Today's Date: 11/14/2021 ? ? 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 PMH:  Right RCR,TIA,Back sg ? ?  ?PT Comments  ? ? POD # 1 am session ?Assisted OOB found to be soaked with urine from poor pure wick placement.  General bed mobility comments: demonstarted and instructed how to use belt to self assist LE off bed.  Pt had difficulty self perfoming and required increased time and Mod Assist to complete due to pain.General transfer comment: 50% VC's on proper hand placement as well as increased time.  Slow moving.  also assisted with a toilet transfer.  Required Min asisst for balance during peri care. Also assisted with a wash up and gown change.  Assisted with amb to recliner as pt was too fatigued to attempt hallway.  Positioned in recliner to comfort and applied ICE. ?Will need another PT session this afternoon to increase her mobility and practice one step she has to enter her home. ?  ?Recommendations for follow up therapy are one component of a multi-disciplinary discharge planning process, led by the attending physician.  Recommendations may be updated based on patient status, additional functional criteria and insurance authorization. ? ?Follow Up Recommendations ? Follow physician's recommendations for discharge plan and follow up therapies ?  ?  ?Assistance Recommended at Discharge Frequent or constant Supervision/Assistance  ?Patient can return home with the following Help with stairs or ramp for entrance;A little help with bathing/dressing/bathroom;A little help with walking and/or transfers;Assistance with cooking/housework;Assist for transportation ?  ?Equipment Recommendations ? None recommended by PT  ?  ?Recommendations for Other Services   ? ? ?  ?Precautions / Restrictions Precautions ?Precautions: Fall;Knee ?Precaution Comments: instructed no pillow under  knee ?Restrictions ?Weight Bearing Restrictions: No ?RLE Weight Bearing: Weight bearing as tolerated  ?  ? ?Mobility ? Bed Mobility ?Overal bed mobility: Needs Assistance ?Bed Mobility: Supine to Sit ?  ?  ?Supine to sit: Mod assist ?  ?  ?General bed mobility comments: demonstarted and instructed how to use belt to self assist LE off bed.  Pt had difficulty self perfoming and required increased time and Mod Assist to complete due to pain. ?  ? ?Transfers ?Overall transfer level: Needs assistance ?Equipment used: Rolling walker (2 wheels) ?Transfers: Sit to/from Stand ?Sit to Stand: Min assist ?  ?  ?  ?  ?  ?General transfer comment: 50% VC's on proper hand placement as well as increased time.  Slow moving.  also assisted with a toilet transfer.  Required Min asisst for balance during peri care. ?  ? ?Ambulation/Gait ?Ambulation/Gait assistance: Min assist ?Gait Distance (Feet): 18 Feet ?Assistive device: Rolling walker (2 wheels) ?Gait Pattern/deviations: Step-through pattern, Step-to pattern, Decreased step length - right, Decreased stance time - right ?Gait velocity: decreased ?  ?  ?General Gait Details: limited amb distance to and from bathroom 9 feet x 2 due to pain, effort and time.  Nearly 7 min to complete 9 feet.  50% VC's on proper walker to self distance and safety with turns.  Self limited WBing Righ LE due to pain. ? ? ?Stairs ?  ?  ?  ?  ?  ? ? ?Wheelchair Mobility ?  ? ?Modified Rankin (Stroke Patients Only) ?  ? ? ?  ?Balance   ?  ?  ?  ?  ?  ?  ?  ?  ?  ?  ?  ?  ?  ?  ?  ?  ?  ?  ?  ? ?  ?  Cognition Arousal/Alertness: Awake/alert ?Behavior During Therapy: [facility] for tasks assessed/performed ?Overall Cognitive Status: Within Functional Limits for tasks assessed ?  ?  ?  ?  ?  ?  ?  ?  ?  ?  ?  ?  ?  ?  ?  ?  ?General Comments: AxO x 3 pleasant ?  ?  ? ?  ?Exercises   ? ?  ?General Comments   ?  ?  ? ?Pertinent Vitals/Pain Pain Assessment ?Pain Assessment: 0-10 ?Pain Score: 8  ?Pain Location: r  knee ?Pain Descriptors / Indicators: Aching, Discomfort, Grimacing, Operative site guarding ?Pain Intervention(s): Monitored during session, Premedicated before session, Repositioned, Ice applied  ? ? ?Home Living   ?  ?  ?  ?  ?  ?  ?  ?  ?  ?   ?  ?Prior Function    ?  ?  ?   ? ?PT Goals (current goals can now be found in the care plan section) Progress towards PT goals: Progressing toward goals ? ?  ?Frequency ? ? ? 7X/week ? ? ? ?  ?PT Plan Current plan remains appropriate  ? ? ?Co-evaluation   ?  ?  ?  ?  ? ?  ?AM-PAC PT "6 Clicks" Mobility   ?Outcome Measure ? Help needed turning from your back to your side while in a flat bed without using bedrails?: A Little ?Help needed moving from lying on your back to sitting on the side of a flat bed without using bedrails?: A Little ?Help needed moving to and from a bed to a chair (including a wheelchair)?: A Little ?Help needed standing up from a chair using your arms (e.g., wheelchair or bedside chair)?: A Little ?Help needed to walk in hospital room?: A Lot ?Help needed climbing 3-5 steps with a railing? : Total ?6 Click Score: 15 ? ?  ?End of Session Equipment Utilized During Treatment: Gait belt ?Activity Tolerance: Patient limited by fatigue;Patient limited by pain ?Patient left: in chair;with call bell/phone within reach;with family/visitor present ?Nurse Communication: Mobility status ?PT Visit Diagnosis: Unsteadiness on feet (R26.81);Other abnormalities of gait and mobility (R26.89);Other symptoms and signs involving the nervous system (R29.898) ?  ? ? ?Time: 6389-3734 ?PT Time Calculation (min) (ACUTE ONLY): 41 min ? ?Charges:  $Gait Training: 23-37 mins ?$Therapeutic Activity: 8-22 mins          ?          ? ?Rica Koyanagi  PTA ?Acute  Rehabilitation Services ?Pager      [phone] ?Office      [phone] ? ?

## 2021-11-14 NOTE — Progress Notes (Addendum)
Physical Therapy Treatment ?Patient Details ?Name: Alexandra [name] ?MRN: [id number] ?DOB: [birth year] ?Today's Date: 11/14/2021 ? ? 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 PMH:  Right RCR,TIA,Back sg ? ?  ?PT Comments  ? ? POD # 1 pm session ?Pt amb to and from bathroom with nursing just prior.  "It was rough" stated pt.  Pt now back in bed so performed her TKR TE's following HEP handout.  Spouse present to observe.  Pt pre medicated with 39m OXY and reported 7/10 pain and vocalized pain with every TE's but also dozing off to sleep.   Performed all supine TE's followed by ICE. ?Pt has NOT met her mobility and goals to safely D/C to home today.    ?Recommendations for follow up therapy are one component of a multi-disciplinary discharge planning process, led by the attending physician.  Recommendations may be updated based on patient status, additional functional criteria and insurance authorization. ? ?Follow Up Recommendations ? Follow physician's recommendations for discharge plan and follow up therapies ?  ?  ?Assistance Recommended at Discharge Frequent or constant Supervision/Assistance  ?Patient can return home with the following Help with stairs or ramp for entrance;A little help with bathing/dressing/bathroom;A little help with walking and/or transfers;Assistance with cooking/housework;Assist for transportation ?  ?Equipment Recommendations ? None recommended by PT  ?  ?Recommendations for Other Services   ? ? ?  ?Precautions / Restrictions Precautions ?Precautions: Fall;Knee ?Precaution Comments: instructed no pillow under knee ?Restrictions ?Weight Bearing Restrictions: No ?RLE Weight Bearing: Weight bearing as tolerated  ?  ? ?Mobility ?  ? ?  ?Balance   ?  ?  ?  ?  ?  ?  ?  ?  ?  ?  ?  ?  ?  ?  ?  ?  ?  ?  ?  ? ?  ?Cognition Arousal/Alertness: Awake/alert ?[facility]: [facility]/performed ?Overall Cognitive Status: Within Functional Limits for tasks  assessed ?  ?  ?  ?  ?  ?  ?  ?  ?  ?  ?  ?  ?  ?  ?  ?  ?General Comments: AxO x 3 pleasant ?  ?  ? ?  ?Exercises  Total Knee Replacement TE's following HEP handout ?10 reps B LE ankle pumps ?05 reps towel squeezes ?05 reps knee presses ?05 reps heel slides  ?05 reps SAQ's ?05 reps SLR's ?05 reps ABD ?Educated on use of gait belt to assist with TE's ?Followed by ICE ? ? ?  ?General Comments   ?  ?  ? ?Pertinent Vitals/Pain Pain Assessment ?Pain Assessment: 0-10 ?Pain Score: 8  ?Pain Location: r knee ?Pain Descriptors / Indicators: Aching, Discomfort, Grimacing, Operative site guarding ?Pain Intervention(s): Monitored during session, Premedicated before session, Repositioned, Ice applied  ? ? ?Home Living   ?  ?  ?  ?  ?  ?  ?  ?  ?  ?   ?  ?Prior Function    ?  ?  ?   ? ?PT Goals (current goals can now be found in the care plan section) Progress towards PT goals: Progressing toward goals ? ?  ?Frequency ? ? ? 7X/week ? ? ? ?  ?PT Plan Current plan remains appropriate  ? ? ?Co-evaluation   ?  ?  ?  ?  ? ?  ?AM-PAC PT "6 Clicks" Mobility   ?Outcome Measure ? Help needed turning from your back  Physical Therapy Treatment ?Patient Details ?Name: Alexandra [name] ?MRN: [id number] ?DOB: 04/29/1961 ?Today's Date: 11/14/2021 ? ? 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 PMH:  Right RCR,TIA,Back sg ? ?  ?PT Comments  ? ? POD # 1 pm session ?Pt amb to and from bathroom with nursing just prior.  "It was rough" stated pt.  Pt now back in bed so performed her TKR TE's following HEP handout.  Spouse present to observe.  Pt pre medicated with 15mg OXY and reported 7/10 pain and vocalized pain with every TE's but also dozing off to sleep.   Performed all supine TE's followed by ICE. ?Pt has NOT met her mobility and goals to safely D/C to home today.    ?Recommendations for follow up therapy are one component of a multi-disciplinary discharge planning process, led by the attending physician.  Recommendations may be updated based on patient status, additional functional criteria and insurance authorization. ? ?Follow Up Recommendations ? Follow physician's recommendations for discharge plan and follow up therapies ?  ?  ?Assistance Recommended at Discharge Frequent or constant Supervision/Assistance  ?Patient can return home with the following Help with stairs or ramp for entrance;A little help with bathing/dressing/bathroom;A little help with walking and/or transfers;Assistance with cooking/housework;Assist for transportation ?  ?Equipment Recommendations ? None recommended by PT  ?  ?Recommendations for Other Services   ? ? ?  ?Precautions / Restrictions Precautions ?Precautions: Fall;Knee ?Precaution Comments: instructed no pillow under knee ?Restrictions ?Weight Bearing Restrictions: No ?RLE Weight Bearing: Weight bearing as tolerated  ?  ? ?Mobility ?  ? ?  ?Balance   ?  ?  ?  ?  ?  ?  ?  ?  ?  ?  ?  ?  ?  ?  ?  ?  ?  ?  ?  ? ?  ?Cognition Arousal/Alertness: Awake/alert ?Behavior During Therapy: [facility] for tasks assessed/performed ?Overall Cognitive Status: Within Functional Limits for tasks  assessed ?  ?  ?  ?  ?  ?  ?  ?  ?  ?  ?  ?  ?  ?  ?  ?  ?General Comments: AxO x 3 pleasant ?  ?  ? ?  ?Exercises  Total Knee Replacement TE's following HEP handout ?10 reps B LE ankle pumps ?05 reps towel squeezes ?05 reps knee presses ?05 reps heel slides  ?05 reps SAQ's ?05 reps SLR's ?05 reps ABD ?Educated on use of gait belt to assist with TE's ?Followed by ICE ? ? ?  ?General Comments   ?  ?  ? ?Pertinent Vitals/Pain Pain Assessment ?Pain Assessment: 0-10 ?Pain Score: 8  ?Pain Location: r knee ?Pain Descriptors / Indicators: Aching, Discomfort, Grimacing, Operative site guarding ?Pain Intervention(s): Monitored during session, Premedicated before session, Repositioned, Ice applied  ? ? ?Home Living   ?  ?  ?  ?  ?  ?  ?  ?  ?  ?   ?  ?Prior Function    ?  ?  ?   ? ?PT Goals (current goals can now be found in the care plan section) Progress towards PT goals: Progressing toward goals ? ?  ?Frequency ? ? ? 7X/week ? ? ? ?  ?PT Plan Current plan remains appropriate  ? ? ?Co-evaluation   ?  ?  ?  ?  ? ?  ?AM-PAC PT "6 Clicks" Mobility   ?Outcome Measure ? Help needed turning from your back

## 2021-11-14 NOTE — Progress Notes (Signed)
? ? ?Subjective: ?Patient reports pain as severe. Getting all possible medicines around the clock but still having severe pain. Looks very sedated. Tolerating diet.  Still having some incontinence issues when standing. No CP, SOB.  Working with PT on mobilizing OOB. ? ?Objective:  ? ?VITALS:   ?Vitals:  ? 11/13/21 2335 11/14/21 0125 11/14/21 0426 11/14/21 1016  ?BP: (!) 158/79 126/74 (!) 143/79 (!) 154/89  ?Pulse: 70 82 85 82  ?Resp: '18 15 18 18  '$ ?Temp: 97.8 ?F (36.6 ?C) 97.9 ?F (36.6 ?C) 98.4 ?F (36.9 ?C) 98.3 ?F (36.8 ?C)  ?TempSrc:    Oral  ?SpO2: 96% 92% 90% 97%  ?Weight:      ?Height:      ? ? ?  Latest Ref Rng & Units 10/31/2021  ?  2:45 PM 09/27/2021  ? 10:39 AM 05/17/2021  ?  9:59 AM  ?CBC  ?WBC 4.0 - 10.5 K/uL 10.1   10.3   9.6    ?Hemoglobin 12.0 - 15.0 g/dL 13.3   13.8   12.9    ?Hematocrit 36.0 - 46.0 % 39.2   40.6   38.8    ?Platelets 150 - 400 K/uL 369   375.0   324.0    ? ? ?  Latest Ref Rng & Units 10/31/2021  ?  2:45 PM 09/27/2021  ? 10:39 AM 05/17/2021  ?  9:59 AM  ?BMP  ?Glucose 70 - 99 mg/dL 82   188   127    ?BUN 8 - 23 mg/dL '13   13   7    '$ ?Creatinine 0.44 - 1.00 mg/dL 0.78   0.85   0.72    ?Sodium 135 - 145 mmol/L 140   141   144    ?Potassium 3.5 - 5.1 mmol/L 3.5   3.8   3.9    ?Chloride 98 - 111 mmol/L 103   101   106    ?CO2 22 - 32 mmol/L 29   33   30    ?Calcium 8.9 - 10.3 mg/dL 9.0   9.9   8.9    ? ?Intake/Output   ?   04/18 0701 ?04/19 0700 04/19 0701 ?04/20 0700  ? P.O. 300   ? I.V. (mL/kg) 1300 (15.8)   ? IV Piggyback 200   ? Total Intake(mL/kg) 1800 (21.9)   ? Urine (mL/kg/hr) 800 (0.4) 300 (0.4)  ? Stool 0   ? Blood 25   ? Total Output 825 300  ? Net +975 -300  ?     ? Urine Occurrence 3 x 2 x  ? Stool Occurrence 0 x   ?  ? ? ?Physical Exam: ?General: NAD.  Sitting up in bedside chair, needs to use the bathroom, looks very drowsy with drooping eyes ?Resp: No increased wob ?Cardio: regular rate and rhythm ?ABD soft ?Neurologically intact ?MSK ?Neurovascularly intact ?Sensation intact  distally ?Intact pulses distally ?Dorsiflexion/Plantar flexion intact ?Incision: dressing C/D/I ? ? ?Assessment: ?1 Day Post-Op  ?S/P Procedure(s) (LRB): ?TOTAL KNEE ARTHROPLASTY (Right) ?by Dr. Ernesta Amble. Murphy on 11/13/21 ? ?Principal Problem: ?  S/P total knee arthroplasty, right ? ? ? ?Plan: ?Work on pain control  ?Advance diet ?Up with therapy ?Incentive Spirometry ?Elevate and Apply ice ? ?Weightbearing: WBAT RLE ?Insicional and dressing care: Dressings left intact until follow-up and Reinforce dressings as needed ?Orthopedic device(s): None ?Showering: Keep dressing dry ?VTE prophylaxis:  Xarelto '10mg'$  daily   x 30 days , SCDs, ambulation ?Pain control: going  to switch from IV Dilaudid to PO at stronger dose to see if that works better ?Follow - up plan: 2 weeks ?Contact information:  Edmonia Lynch MD, Aggie Moats PA-C ? ?Dispo: Home hopefully later today if passes PT and pain controlled ? ? ? ? ?Britt Bottom, PA-C ?Office [phone] ?11/14/2021, 4:29 PM  ?

## 2021-11-14 NOTE — Progress Notes (Incomplete)
Orthopedic Tech Progress Note ?Patient Details:  ?Alexandra [name] ?[birth year] ?[id number] ? ?{Ortho Tech [id number] ? ?Kennis Carina ?11/14/2021, 5:36 PM ? ?

## 2021-11-14 NOTE — TOC Transition Note (Signed)
Transition of Care (TOC) - CM/SW Discharge Note ? ? ?Patient Details  ?Name: Alexandra [name] ?MRN: [id number] ?Date of Birth: 12-May-1961 ? ?Transition of Care (TOC) CM/SW Contact:  ?Lashan Gluth, LCSW ?Phone Number: ?11/14/2021, 10:29 AM ? ? ?Clinical Narrative:    ? ?Met with pt and confirming her DME already delivered to home prior to surgery.  OPPT already arranged with [facility].  No TOC needs. ? ?Final next level of care: OP Rehab ?Barriers to Discharge: No Barriers Identified ? ? ?Patient Goals and CMS Choice ?Patient states their goals for this hospitalization and ongoing recovery are:: return home ?  ?  ? ?Discharge Placement ?  ?           ?  ?  ?  ?  ? ?Discharge Plan and Services ?  ?  ?           ?DME Arranged: Walker rolling, CPM ?DME Agency: [employer] ?  ?  ?  ?  ?  ?  ?  ?  ? ?Social Determinants of Health (SDOH) Interventions ?  ? ? ?Readmission Risk Interventions ?   ? View : No data to display.  ?  ?  ?  ? ? ? ? ? ?

## 2021-11-15 ENCOUNTER — Inpatient Hospital Stay (HOSPITAL_COMMUNITY): Payer: Medicare Other

## 2021-11-15 ENCOUNTER — Ambulatory Visit: Payer: Medicare Other | Admitting: Adult Health

## 2021-11-15 DIAGNOSIS — M25761 Osteophyte, right knee: Secondary | ICD-10-CM | POA: Diagnosis present

## 2021-11-15 DIAGNOSIS — Z888 Allergy status to other drugs, medicaments and biological substances status: Secondary | ICD-10-CM | POA: Diagnosis not present

## 2021-11-15 DIAGNOSIS — I10 Essential (primary) hypertension: Secondary | ICD-10-CM | POA: Diagnosis present

## 2021-11-15 DIAGNOSIS — Z8673 Personal history of transient ischemic attack (TIA), and cerebral infarction without residual deficits: Secondary | ICD-10-CM | POA: Diagnosis not present

## 2021-11-15 DIAGNOSIS — K760 Fatty (change of) liver, not elsewhere classified: Secondary | ICD-10-CM | POA: Diagnosis present

## 2021-11-15 DIAGNOSIS — M79604 Pain in right leg: Secondary | ICD-10-CM

## 2021-11-15 DIAGNOSIS — Z90710 Acquired absence of both cervix and uterus: Secondary | ICD-10-CM | POA: Diagnosis not present

## 2021-11-15 DIAGNOSIS — M1711 Unilateral primary osteoarthritis, right knee: Secondary | ICD-10-CM | POA: Diagnosis present

## 2021-11-15 DIAGNOSIS — Z560 Unemployment, unspecified: Secondary | ICD-10-CM | POA: Diagnosis not present

## 2021-11-15 DIAGNOSIS — R32 Unspecified urinary incontinence: Secondary | ICD-10-CM | POA: Diagnosis not present

## 2021-11-15 DIAGNOSIS — Z91013 Allergy to seafood: Secondary | ICD-10-CM | POA: Diagnosis not present

## 2021-11-15 DIAGNOSIS — Z79899 Other long term (current) drug therapy: Secondary | ICD-10-CM | POA: Diagnosis not present

## 2021-11-15 DIAGNOSIS — Z9884 Bariatric surgery status: Secondary | ICD-10-CM | POA: Diagnosis not present

## 2021-11-15 DIAGNOSIS — E785 Hyperlipidemia, unspecified: Secondary | ICD-10-CM | POA: Diagnosis present

## 2021-11-15 DIAGNOSIS — Z9049 Acquired absence of other specified parts of digestive tract: Secondary | ICD-10-CM | POA: Diagnosis not present

## 2021-11-15 DIAGNOSIS — E119 Type 2 diabetes mellitus without complications: Secondary | ICD-10-CM | POA: Diagnosis present

## 2021-11-15 DIAGNOSIS — Z90722 Acquired absence of ovaries, bilateral: Secondary | ICD-10-CM | POA: Diagnosis not present

## 2021-11-15 DIAGNOSIS — Z9079 Acquired absence of other genital organ(s): Secondary | ICD-10-CM | POA: Diagnosis not present

## 2021-11-15 DIAGNOSIS — Z79890 Hormone replacement therapy (postmenopausal): Secondary | ICD-10-CM | POA: Diagnosis not present

## 2021-11-15 DIAGNOSIS — Z981 Arthrodesis status: Secondary | ICD-10-CM | POA: Diagnosis not present

## 2021-11-15 DIAGNOSIS — Z79891 Long term (current) use of opiate analgesic: Secondary | ICD-10-CM | POA: Diagnosis not present

## 2021-11-15 DIAGNOSIS — Z7984 Long term (current) use of oral hypoglycemic drugs: Secondary | ICD-10-CM | POA: Diagnosis not present

## 2021-11-15 DIAGNOSIS — Z7985 Long-term (current) use of injectable non-insulin antidiabetic drugs: Secondary | ICD-10-CM | POA: Diagnosis not present

## 2021-11-15 DIAGNOSIS — K219 Gastro-esophageal reflux disease without esophagitis: Secondary | ICD-10-CM | POA: Diagnosis present

## 2021-11-15 DIAGNOSIS — E039 Hypothyroidism, unspecified: Secondary | ICD-10-CM | POA: Diagnosis present

## 2021-11-15 LAB — CBC WITH DIFFERENTIAL/PLATELET
Abs Immature Granulocytes: 0.05 10*3/uL (ref 0.00–0.07)
Basophils Absolute: 0 10*3/uL (ref 0.0–0.1)
Basophils Relative: 0 %
Eosinophils Absolute: 0 10*3/uL (ref 0.0–0.5)
Eosinophils Relative: 0 %
HCT: 37.8 % (ref 36.0–46.0)
Hemoglobin: 12.9 g/dL (ref 12.0–15.0)
Immature Granulocytes: 1 %
Lymphocytes Relative: 28 %
Lymphs Abs: 2.8 10*3/uL (ref 0.7–4.0)
MCH: 32.5 pg (ref 26.0–34.0)
MCHC: 34.1 g/dL (ref 30.0–36.0)
MCV: 95.2 fL (ref 80.0–100.0)
Monocytes Absolute: 1.1 10*3/uL — ABNORMAL HIGH (ref 0.1–1.0)
Monocytes Relative: 11 %
Neutro Abs: 6.1 10*3/uL (ref 1.7–7.7)
Neutrophils Relative %: 60 %
Platelets: 319 10*3/uL (ref 150–400)
RBC: 3.97 MIL/uL (ref 3.87–5.11)
RDW: 13.4 % (ref 11.5–15.5)
WBC: 10.1 10*3/uL (ref 4.0–10.5)
nRBC: 0 % (ref 0.0–0.2)

## 2021-11-15 LAB — COMPREHENSIVE METABOLIC PANEL
ALT: 31 U/L (ref 0–44)
AST: 37 U/L (ref 15–41)
Albumin: 3.4 g/dL — ABNORMAL LOW (ref 3.5–5.0)
Alkaline Phosphatase: 54 U/L (ref 38–126)
Anion gap: 11 (ref 5–15)
BUN: 10 mg/dL (ref 8–23)
CO2: 31 mmol/L (ref 22–32)
Calcium: 8.7 mg/dL — ABNORMAL LOW (ref 8.9–10.3)
Chloride: 95 mmol/L — ABNORMAL LOW (ref 98–111)
Creatinine, Ser: 0.81 mg/dL (ref 0.44–1.00)
GFR, Estimated: >60 mL/min (ref >60)
Glucose, Bld: 199 mg/dL — ABNORMAL HIGH (ref 70–99)
Potassium: 3.2 mmol/L — ABNORMAL LOW (ref 3.5–5.1)
Sodium: 137 mmol/L (ref 135–145)
Total Bilirubin: 0.7 mg/dL (ref 0.3–1.2)
Total Protein: 6.8 g/dL (ref 6.5–8.1)

## 2021-11-15 LAB — D-DIMER, QUANTITATIVE: D-Dimer, Quant: 0.7 ug/mL-FEU — ABNORMAL HIGH (ref 0.00–0.50)

## 2021-11-15 LAB — APTT: aPTT: 31 seconds (ref 24–36)

## 2021-11-15 LAB — PROTIME-INR
INR: 1.2 (ref 0.8–1.2)
Prothrombin Time: 15.2 seconds (ref 11.4–15.2)

## 2021-11-15 MED ORDER — ADULT MULTIVITAMIN W/MINERALS CH
1.0000 | ORAL_TABLET | Freq: Every day | ORAL | Status: DC
  Administered 2021-11-15 – 2021-11-16 (×2): 1 via ORAL
  Filled 2021-11-15 (×2): qty 1

## 2021-11-15 MED ORDER — PREDNISONE 20 MG PO TABS
20.0000 mg | ORAL_TABLET | Freq: Once | ORAL | Status: AC
  Administered 2021-11-15: 20 mg via ORAL
  Filled 2021-11-15: qty 1

## 2021-11-15 MED ORDER — PREDNISONE 20 MG PO TABS
20.0000 mg | ORAL_TABLET | Freq: Two times a day (BID) | ORAL | Status: DC
  Administered 2021-11-16: 20 mg via ORAL
  Filled 2021-11-15: qty 1

## 2021-11-15 MED ORDER — SODIUM CHLORIDE 0.9 % IV BOLUS
1000.0000 mL | Freq: Once | INTRAVENOUS | Status: AC
  Administered 2021-11-15: 1000 mL via INTRAVENOUS

## 2021-11-15 NOTE — Progress Notes (Signed)
Orthopedic Tech Progress Note ?Patient Details:  ?[name] ?[birth year] ?[id number] ? ?Ortho Devices ?Type of Ortho Device: Bone foam zero knee ?Ortho Device/Splint Interventions: Ordered ?  ?Post Interventions ?Patient Tolerated: Poor ? ?[name] ?11/15/2021, 8:54 PM ?Delivered bone foam.  ?

## 2021-11-15 NOTE — Progress Notes (Signed)
Physical Therapy Treatment ?Patient Details ?Name: Alexandra [name] ?MRN: [id number] ?DOB: Aug 12, 1960 ?Today's Date: 11/15/2021 ? ? 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 PMH:  Right RCR,TIA,Back sg ? ?  ?PT Comments  ? ? POD # 2 am session ?Pt VERY sleepy/groggy in recliner.  Nursing staff reported "it took two of [place] to get her up".  Pt is alert and responsive but difficulty staying awake.  Pt was unable to rise out of recliner during Therapy.  Called for extra assist.  Pt was unable to stand on her own.  Required + 2 MAX Assist just to transfer from recliner to [facility].  Pt voided and required assistance with hygiene.  Then + 2 MAX Assist back to recliner.  Unable to take any functional steps.  Reported to RN.  Pt was given : ?10 mg OXY 828 ?50 mg Tramadol 1025 ?500 mg Robaxin 1025 ?10000 mg Tylenol 1245 ?Discussed with RN  ?Will see her again this afternoon as she is suppose to D/C to home today. ?  ?Recommendations for follow up therapy are one component of a multi-disciplinary discharge planning process, led by the attending physician.  Recommendations may be updated based on patient status, additional functional criteria and insurance authorization. ? ?Follow Up Recommendations ? Follow physician's recommendations for discharge plan and follow up therapies ?  ?  ?Assistance Recommended at Discharge Frequent or constant Supervision/Assistance  ?Patient can return home with the following Help with stairs or ramp for entrance;A little help with bathing/dressing/bathroom;A little help with walking and/or transfers;Assistance with cooking/housework;Assist for transportation ?  ?Equipment Recommendations ? None recommended by PT  ?  ?Recommendations for Other Services   ? ? ?  ?Precautions / Restrictions Precautions ?Precautions: Fall;Knee ?Precaution Comments: instructed no pillow under knee ?Restrictions ?Weight Bearing Restrictions: No ?RLE Weight Bearing: Weight bearing as tolerated  ?   ? ?Mobility ? Bed Mobility ?  ?  ?  ?  ?  ?  ?  ?General bed mobility comments: OOB in recliner ?  ? ?Transfers ?Overall transfer level: Needs assistance ?Equipment used: Rolling walker (2 wheels) ?Transfers: Sit to/from Stand, Bed to chair/wheelchair/BSC ?Sit to Stand: Max assist, Total assist, +2 physical assistance, +2 safety/equipment ?Stand pivot transfers: Max assist, Total assist, +2 physical assistance, +2 safety/equipment ?  ?  ?  ?  ?General transfer comment: pt required + 2 assist to transfer from recliner to [facility].  Pt unable to stand and support herself. ?  ? ?Ambulation/Gait ?  ?  ?  ?  ?  ?  ?  ?General Gait Details: unable to amb due to poor transfer ability. ? ? ?Stairs ?  ?  ?  ?  ?  ? ? ?Wheelchair Mobility ?  ? ?Modified Rankin (Stroke Patients Only) ?  ? ? ?  ?Balance   ?  ?  ?  ?  ?  ?  ?  ?  ?  ?  ?  ?  ?  ?  ?  ?  ?  ?  ?  ? ?  ?Cognition Arousal/Alertness: Lethargic ?Behavior During Therapy: Flat affect ?Overall Cognitive Status: Impaired/Different from baseline ?  ?  ?  ?  ?  ?  ?  ?  ?  ?  ?  ?  ?  ?  ?  ?  ?General Comments: AxO x 3 in/ou groogy/sleepy/lethargic (pain meds) she recalls my name but can't seem to stay awake. ?  ?  ? ?  ?  Exercises   ? ?  ?General Comments   ?  ?  ? ?Pertinent Vitals/Pain Pain Assessment ?Pain Assessment: Faces ?Faces Pain Scale: Hurts whole lot ?Pain Location: R knee ?Pain Descriptors / Indicators: Aching, Discomfort, Grimacing, Operative site guarding, Tender, Throbbing ?Pain Intervention(s): Monitored during session, Premedicated before session, Repositioned, Ice applied  ? ? ?Home Living   ?  ?  ?  ?  ?  ?  ?  ?  ?  ?   ?  ?Prior Function    ?  ?  ?   ? ?PT Goals (current goals can now be found in the care plan section) Progress towards PT goals: Progressing toward goals ? ?  ?Frequency ? ? ? 7X/week ? ? ? ?  ?PT Plan Current plan remains appropriate  ? ? ?Co-evaluation   ?  ?  ?  ?  ? ?  ?AM-PAC PT "6 Clicks" Mobility   ?Outcome Measure ? Help needed  turning from your back to your side while in a flat bed without using bedrails?: A Lot ?Help needed moving from lying on your back to sitting on the side of a flat bed without using bedrails?: A Lot ?Help needed moving to and from a bed to a chair (including a wheelchair)?: A Lot ?Help needed standing up from a chair using your arms (e.g., wheelchair or bedside chair)?: A Lot ?Help needed to walk in hospital room?: Total ?Help needed climbing 3-5 steps with a railing? : Total ?6 Click Score: 10 ? ?  ?End of Session   ?Activity Tolerance: Other (comment) (lethargic) ?Patient left: in chair;with call bell/phone within reach;with chair alarm set ?Nurse Communication: Mobility status ?PT Visit Diagnosis: Unsteadiness on feet (R26.81);Other abnormalities of gait and mobility (R26.89);Other symptoms and signs involving the nervous system (R29.898) ?  ? ? ?Time: 3716-9678 ?PT Time Calculation (min) (ACUTE ONLY): 27 min ? ?Charges:  $Therapeutic Activity: 23-37 mins          ?          ?Rica Koyanagi  PTA ?Acute  Rehabilitation Services ?Pager      [phone] ?Office      [phone] ? ?

## 2021-11-15 NOTE — Progress Notes (Signed)
Orthopedic Tech Progress Note ?Patient Details:  ?Ledia Hanford ?11-Jan-1961 ?[id number] ? ?CPM Right Knee ?CPM Right Knee: On ?Right Knee Flexion (Degrees): 0 ?Right Knee Extension (Degrees): 40 ? ?Post Interventions ?Patient Tolerated: Poor ? ?Charline Bills Xaria Judon ?11/15/2021, 5:30 PM ?Applied CPM. Patient unable to tolerate anything over 0 to 40 degrees. ?

## 2021-11-15 NOTE — Progress Notes (Signed)
Physical Therapy Treatment ?Patient Details ?Name: Alexandra [name] ?MRN: [id number] ?DOB: 06-Apr-1961 ?Today's Date: 11/15/2021 ? ? 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 PMH:  Right RCR,TIA,Back sg ? ?  ?PT Comments  ? ? POD # 2 pm session ?Assisted back to bed for [place] R LE required + 2 Max Assist.  Pt remains sleepy/groggy with brief moments of interaction.    ?Recommendations for follow up therapy are one component of a multi-disciplinary discharge planning process, led by the attending physician.  Recommendations may be updated based on patient status, additional functional criteria and insurance authorization. ? ?Follow Up Recommendations ? Follow physician's recommendations for discharge plan and follow up therapies ?  ?  ?Assistance Recommended at Discharge Frequent or constant Supervision/Assistance  ?Patient can return home with the following Help with stairs or ramp for entrance;A little help with bathing/dressing/bathroom;A little help with walking and/or transfers;Assistance with cooking/housework;Assist for transportation ?  ?Equipment Recommendations ? None recommended by PT  ?  ?Recommendations for Other Services   ? ? ?  ?Precautions / Restrictions Precautions ?Precautions: Fall;Knee ?Precaution Comments: instructed no pillow under knee ?Restrictions ?Weight Bearing Restrictions: No ?RLE Weight Bearing: Weight bearing as tolerated  ?  ? ?Mobility ? Bed Mobility ?Overal bed mobility: Needs Assistance ?Bed Mobility: Sit to Supine ?  ?  ?  ?Sit to supine: Max assist, +2 for physical assistance, +2 for safety/equipment ?  ?General bed mobility comments: Pt required + 2 Max Assist back to bed ?  ? ?Transfers ?Overall transfer level: Needs assistance ?Equipment used: Rolling walker (2 wheels) ?Transfers: Sit to/from Stand, Bed to chair/wheelchair/BSC ?Sit to Stand: Max assist, Total assist, +2 physical assistance, +2 safety/equipment ?Stand pivot transfers: Max assist, Total assist, +2  physical assistance, +2 safety/equipment ?  ?  ?  ?  ?General transfer comment: pt required + 2 Max Assist to get back to bed. ?  ? ?Ambulation/Gait ?  ?  ?  ?  ?  ?  ?  ?General Gait Details: unable to amb due to poor transfer ability. ? ? ?Stairs ?  ?  ?  ?  ?  ? ? ?Wheelchair Mobility ?  ? ?Modified Rankin (Stroke Patients Only) ?  ? ? ?  ?Balance   ?  ?  ?  ?  ?  ?  ?  ?  ?  ?  ?  ?  ?  ?  ?  ?  ?  ?  ?  ? ?  ?Cognition Arousal/Alertness: Lethargic ?Behavior During Therapy: Flat affect ?Overall Cognitive Status: Impaired/Different from baseline ?  ?  ?  ?  ?  ?  ?  ?  ?  ?  ?  ?  ?  ?  ?  ?  ?General Comments: AxO x 3 in/ou groogy/sleepy/lethargic (pain meds) she recalls my name but can't seem to stay awake. ?  ?  ? ?  ?Exercises   ? ?  ?General Comments   ?  ?  ? ?Pertinent Vitals/Pain Pain Assessment ?Pain Assessment: Faces ?Faces Pain Scale: Hurts little more ?Pain Location: R knee ?Pain Descriptors / Indicators: Aching, Discomfort, Grimacing, Operative site guarding, Tender, Throbbing ?Pain Intervention(s): Monitored during session, Premedicated before session, Repositioned, Ice applied  ? ? ?Home Living   ?  ?  ?  ?  ?  ?  ?  ?  ?  ?   ?  ?Prior Function    ?  ?  ?   ? ?  PT Goals (current goals can now be found in the care plan section) Progress towards PT goals: Progressing toward goals ? ?  ?Frequency ? ? ? 7X/week ? ? ? ?  ?PT Plan Current plan remains appropriate  ? ? ?Co-evaluation   ?  ?  ?  ?  ? ?  ?AM-PAC PT "6 Clicks" Mobility   ?Outcome Measure ? Help needed turning from your back to your side while in a flat bed without using bedrails?: A Lot ?Help needed moving from lying on your back to sitting on the side of a flat bed without using bedrails?: A Lot ?Help needed moving to and from a bed to a chair (including a wheelchair)?: A Lot ?Help needed standing up from a chair using your arms (e.g., wheelchair or bedside chair)?: A Lot ?Help needed to walk in hospital room?: A Lot ?Help needed climbing  3-5 steps with a railing? : Total ?6 Click Score: 11 ? ?  ?End of Session Equipment Utilized During Treatment: Gait belt ?Activity Tolerance: Patient limited by lethargy ?Patient left: in bed;with bed alarm set ?Nurse Communication: Mobility status ?PT Visit Diagnosis: Unsteadiness on feet (R26.81);Other abnormalities of gait and mobility (R26.89);Other symptoms and signs involving the nervous system (R29.898) ?  ? ? ?Time: 5038-8828 ?PT Time Calculation (min) (ACUTE ONLY): 11 min ? ?Charges:  $Therapeutic Activity: 8-22 mins          ?          ? ?Rica Koyanagi  PTA ?Acute  Rehabilitation Services ?Pager      [phone] ?Office      [phone] ? ?

## 2021-11-15 NOTE — Progress Notes (Signed)
? ? ?Subjective: ?Patient reports pain is still severe. Getting all possible medicines around the clock but still having severe pain. Continues to look very sedated and drowsy. Barely able to stay awake to hold conversation. Little appetite but tolerating diet.  Still having some incontinence issues when standing. Says her lower leg below the knee is extremely painful compared to rest of leg. No CP, SOB.  Working with PT on [place] but progress has been greatly limited by pain. She still hasn't walked the hallway or tried the stairs. ? ?Objective:  ? ?VITALS:   ?Vitals:  ? 11/15/21 0130 11/15/21 0159 11/15/21 8921 11/15/21 1125  ?BP: (!) 147/90 121/67 122/66 (!) 145/78  ?Pulse: 82 85 81 90  ?Resp: '18 16 18 14  '$ ?Temp: 98.4 ?F (36.9 ?C) 98.9 ?F (37.2 ?C) 99.2 ?F (37.3 ?C) 98.2 ?F (36.8 ?C)  ?TempSrc: Oral Oral Oral Oral  ?SpO2: 99% 98% 94% 95%  ?Weight:      ?Height:      ? ? ?  Latest Ref Rng & Units 10/31/2021  ?  2:45 PM 09/27/2021  ? 10:39 AM 05/17/2021  ?  9:59 AM  ?CBC  ?WBC 4.0 - 10.5 K/uL 10.1   10.3   9.6    ?Hemoglobin 12.0 - 15.0 g/dL 13.3   13.8   12.9    ?Hematocrit 36.0 - 46.0 % 39.2   40.6   38.8    ?Platelets 150 - 400 K/uL 369   375.0   324.0    ? ? ?  Latest Ref Rng & Units 10/31/2021  ?  2:45 PM 09/27/2021  ? 10:39 AM 05/17/2021  ?  9:59 AM  ?BMP  ?Glucose 70 - 99 mg/dL 82   188   127    ?BUN 8 - 23 mg/dL '13   13   7    '$ ?Creatinine 0.44 - 1.00 mg/dL 0.78   0.85   0.72    ?Sodium 135 - 145 mmol/L 140   141   144    ?Potassium 3.5 - 5.1 mmol/L 3.5   3.8   3.9    ?Chloride 98 - 111 mmol/L 103   101   106    ?CO2 22 - 32 mmol/L 29   33   30    ?Calcium 8.9 - 10.3 mg/dL 9.0   9.9   8.9    ? ?Intake/Output   ?   04/19 0701 ?04/20 0700 04/20 0701 ?04/21 0700  ? P.O. 240   ? I.V. (mL/kg)    ? IV Piggyback 0   ? Total Intake(mL/kg) 240 (2.9)   ? Urine (mL/kg/hr) 2700 (1.4) 150 (0.3)  ? Stool    ? Blood    ? Total Output 2700 150  ? Net -1941 -150  ?     ? Urine Occurrence 3 x   ?  ? ? ?Physical  Exam: ?General: NAD.  Sitting up in bedside chair. Very drowsy. Hard to keep eyes open and carry conversation ?Resp: No increased wob ?Cardio: regular rate and rhythm ?ABD soft ?Neurologically intact ?MSK ?Neurovascularly intact ?Sensation intact distally ?Intact pulses distally ?Dorsiflexion/Plantar flexion intact ?Calf soft and compressible but very TTP ?Pain with passive stretch of right ankle  ?Wearing TED hose and knee wrapped in ACE bandage ?Incision: dressing C/D/I ? ? ?Assessment: ?2 Days Post-Op  ?S/P Procedure(s) (LRB): ?TOTAL KNEE ARTHROPLASTY (Right) ?by Dr. Ernesta Amble. Murphy on 11/13/21 ? ?Principal Problem: ?  S/P total knee arthroplasty, right ? ? ? ?  Plan: ?R/o post-op DVT in RLE. Ordered doppler ultrasound. Patient has been on Xarelto since surgery. ? ?Work on pain control without giving too much medicine which is causing over sedation ? ?I ordered a CPM machine to be placed on her post-op and it has never been given to her. I have reordered it as I think it is crucial to get her knee moving ASAP ? ?I think the urinary incontinence is likely due to anesthesia and continued inpatient medications being given. If it continues, she will likely need an outpatient referral to urology. ? ?Advance diet ?Up with therapy ?Incentive Spirometry ?Elevate and Apply ice ? ?Weightbearing: WBAT RLE ?Insicional and dressing care: Dressings left intact until follow-up and Reinforce dressings as needed ?Orthopedic device(s): None ?Showering: Keep dressing dry ?VTE prophylaxis:  Xarelto '10mg'$  daily   x 30 days , SCDs, ambulation ?Pain control: Tylenol, Oxy, Ultram, Dilaudid ?Follow - up plan: 2 weeks ?Contact information:  Edmonia Lynch MD, Aggie Moats PA-C ? ?Dispo: Home hopefully later today if doppler scan negative for DVT, passes PT, and pain controlled. Had to change her status from OBS to inpatient.  ? ?Husband was in the room and is concerned about her not moving and being drowsy. Worried about ability to take her  to OPPT due to his work schedule. I explained that this is why we have multiple extended conversations in the office pre-op to go over these things. We talked about her needing support after surgery, and they told us it wouldn't be an issue. Suggested she reach out to other family members if he is not able to drive her.   ? ? ? ? ?Britt Bottom, PA-C ?Office [phone] ?11/15/2021, 12:40 PM  ?

## 2021-11-15 NOTE — Progress Notes (Signed)
Right lower extremity venous duplex has been completed. ?Preliminary results can be found in CV Proc through chart review.  ?Results were given to [facility] PA. ? ?11/15/21 4:51 PM ?Carlos Levering RVT   ?

## 2021-11-15 NOTE — Plan of Care (Signed)
?  Problem: Safety: ?Goal: Ability to remain free from injury will improve ?Outcome: Progressing ?  ?Problem: Education: ?Goal: Knowledge of the prescribed therapeutic regimen will improve ?Outcome: Progressing ?  ?Problem: Pain Management: ?Goal: Pain level will decrease with appropriate interventions ?Outcome: Progressing ?  ?

## 2021-11-16 LAB — COMPREHENSIVE METABOLIC PANEL
ALT: 32 U/L (ref 0–44)
AST: 38 U/L (ref 15–41)
Albumin: 3.1 g/dL — ABNORMAL LOW (ref 3.5–5.0)
Alkaline Phosphatase: 53 U/L (ref 38–126)
Anion gap: 7 (ref 5–15)
BUN: 9 mg/dL (ref 8–23)
CO2: 32 mmol/L (ref 22–32)
Calcium: 8.4 mg/dL — ABNORMAL LOW (ref 8.9–10.3)
Chloride: 97 mmol/L — ABNORMAL LOW (ref 98–111)
Creatinine, Ser: 0.62 mg/dL (ref 0.44–1.00)
GFR, Estimated: >60 mL/min (ref >60)
Glucose, Bld: 215 mg/dL — ABNORMAL HIGH (ref 70–99)
Potassium: 3.9 mmol/L (ref 3.5–5.1)
Sodium: 136 mmol/L (ref 135–145)
Total Bilirubin: 0.5 mg/dL (ref 0.3–1.2)
Total Protein: 6.8 g/dL (ref 6.5–8.1)

## 2021-11-16 LAB — CBC WITH DIFFERENTIAL/PLATELET
Abs Immature Granulocytes: 0.06 10*3/uL (ref 0.00–0.07)
Basophils Absolute: 0 10*3/uL (ref 0.0–0.1)
Basophils Relative: 0 %
Eosinophils Absolute: 0 10*3/uL (ref 0.0–0.5)
Eosinophils Relative: 0 %
HCT: 37.2 % (ref 36.0–46.0)
Hemoglobin: 12.9 g/dL (ref 12.0–15.0)
Immature Granulocytes: 1 %
Lymphocytes Relative: 20 %
Lymphs Abs: 1.9 10*3/uL (ref 0.7–4.0)
MCH: 32.3 pg (ref 26.0–34.0)
MCHC: 34.7 g/dL (ref 30.0–36.0)
MCV: 93 fL (ref 80.0–100.0)
Monocytes Absolute: 0.8 10*3/uL (ref 0.1–1.0)
Monocytes Relative: 8 %
Neutro Abs: 6.9 10*3/uL (ref 1.7–7.7)
Neutrophils Relative %: 71 %
Platelets: 298 10*3/uL (ref 150–400)
RBC: 4 MIL/uL (ref 3.87–5.11)
RDW: 13.2 % (ref 11.5–15.5)
WBC: 9.7 10*3/uL (ref 4.0–10.5)
nRBC: 0 % (ref 0.0–0.2)

## 2021-11-16 LAB — D-DIMER, QUANTITATIVE: D-Dimer, Quant: 0.72 ug/mL-FEU — ABNORMAL HIGH (ref 0.00–0.50)

## 2021-11-16 NOTE — Plan of Care (Signed)
?  Problem: Education: ?Goal: Knowledge of General Education information will improve ?Description: Including pain rating scale, medication(s)/side effects and non-pharmacologic comfort measures ?Outcome: Progressing ?  ?Problem: Health Behavior/Discharge Planning: ?Goal: Ability to manage health-related needs will improve ?Outcome: Progressing ?  ?Problem: Clinical Measurements: ?Goal: Ability to maintain clinical measurements within normal limits will improve ?Outcome: Progressing ?Goal: Will remain free from infection ?Outcome: Progressing ?Goal: Diagnostic test results will improve ?Outcome: Progressing ?Goal: Respiratory complications will improve ?Outcome: Progressing ?Goal: Cardiovascular complication will be avoided ?Outcome: Progressing ?  ?Problem: Activity: ?Goal: Risk for activity intolerance will decrease ?Outcome: Progressing ?  ?Problem: Nutrition: ?Goal: Adequate nutrition will be maintained ?Outcome: Progressing ?  ?Problem: Coping: ?Goal: Level of anxiety will decrease ?Outcome: Progressing ?  ?Problem: Elimination: ?Goal: Will not experience complications related to bowel motility ?Outcome: Progressing ?Goal: Will not experience complications related to urinary retention ?Outcome: Progressing ?  ?Problem: Pain Managment: ?Goal: General experience of comfort will improve ?Outcome: Progressing ?  ?Problem: Safety: ?Goal: Ability to remain free from injury will improve ?Outcome: Progressing ?  ?Problem: Skin Integrity: ?Goal: Risk for impaired skin integrity will decrease ?Outcome: Progressing ?  ?Problem: Education: ?Goal: Knowledge of the prescribed therapeutic regimen will improve ?Outcome: Progressing ?Goal: Individualized Educational Video(s) ?Outcome: Progressing ?  ?Problem: Activity: ?Goal: Ability to avoid complications of mobility impairment will improve ?Outcome: Progressing ?Goal: Range of joint motion will improve ?Outcome: Progressing ?  ?Problem: Clinical Measurements: ?Goal:  Postoperative complications will be avoided or minimized ?Outcome: Progressing ?  ?Problem: Pain Management: ?Goal: Pain level will decrease with appropriate interventions ?Outcome: Progressing ?  ?Problem: Skin Integrity: ?Goal: Will show signs of wound healing ?Outcome: Progressing ?  ?Problem: Education: ?Goal: Knowledge of General Education information will improve ?Description: Including pain rating scale, medication(s)/side effects and non-pharmacologic comfort measures ?Outcome: Progressing ?  ?Problem: Activity: ?Goal: Risk for activity intolerance will decrease ?Outcome: Progressing ?  ?Problem: Safety: ?Goal: Ability to remain free from injury will improve ?Outcome: Progressing ?  ?Problem: Skin Integrity: ?Goal: Risk for impaired skin integrity will decrease ?Outcome: Progressing ?  ?

## 2021-11-16 NOTE — Progress Notes (Signed)
Physical Therapy Treatment ?Patient Details ?Name: Alexandra [name] ?MRN: [id number] ?DOB: Oct 20, 1960 ?Today's Date: 11/16/2021 ? ? 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 PMH:  Right RCR,TIA,Back sg ? ?  ?PT Comments  ? ? POD # 3 am session ?Assisted OOB to [facility] then amb in hallway required increased, increased time.  General Comments: AxO x 3 still sleepy but improvement from yesterday.  Pt tolerated amb 30 feet but required nearly 12 min to complete.  Then returned to room to perform some TE's following HEP handout.  Instructed on proper tech, freq as well as use of ICE.   ?Pt will need another PT session to address stairs with Spouse.    ?Recommendations for follow up therapy are one component of a multi-disciplinary discharge planning process, led by the attending physician.  Recommendations may be updated based on patient status, additional functional criteria and insurance authorization. ? ?Follow Up Recommendations ? Follow physician's recommendations for discharge plan and follow up therapies ?  ?  ?Assistance Recommended at Discharge Frequent or constant Supervision/Assistance  ?Patient can return home with the following Help with stairs or ramp for entrance;A little help with bathing/dressing/bathroom;A little help with walking and/or transfers;Assistance with cooking/housework;Assist for transportation ?  ?Equipment Recommendations ? None recommended by PT  ?  ?Recommendations for Other Services   ? ? ?  ?Precautions / Restrictions Precautions ?Precautions: Fall;Knee ?Precaution Comments: instructed no pillow under knee ?Restrictions ?Weight Bearing Restrictions: No ?RLE Weight Bearing: Weight bearing as tolerated  ?  ? ?Mobility ? Bed Mobility ?Overal bed mobility: Needs Assistance ?Bed Mobility: Supine to Sit ?  ?  ?Supine to sit: Mod assist ?  ?  ?General bed mobility comments: Mod Assist with increased time.  Very slow to move. ?  ? ?Transfers ?Overall transfer level: Needs  assistance ?Equipment used: Rolling walker (2 wheels) ?Transfers: Sit to/from Stand, Bed to chair/wheelchair/BSC ?Sit to Stand: Min assist ?  ?  ?  ?  ?  ?General transfer comment: first asissted from bed to [facility] to void then from [facility] to amb.  Very slow moving. ?  ? ?Ambulation/Gait ?Ambulation/Gait assistance: Min guard, Supervision ?Gait Distance (Feet): 30 Feet ?Assistive device: Rolling walker (2 wheels) ?Gait Pattern/deviations: Step-through pattern, Step-to pattern, Decreased step length - right, Decreased stance time - right ?Gait velocity: decreased ?  ?  ?General Gait Details: tolerated amb a functional of 30 feet at MinGuard Assist.  VERY slow but steady gait. ? ? ?Stairs ?  ?  ?  ?  ?  ? ? ?Wheelchair Mobility ?  ? ?Modified Rankin (Stroke Patients Only) ?  ? ? ?  ?Balance   ?  ?  ?  ?  ?  ?  ?  ?  ?  ?  ?  ?  ?  ?  ?  ?  ?  ?  ?  ? ?  ?Cognition Arousal/Alertness: Awake/alert ?Behavior During Therapy: [facility] for tasks assessed/performed ?Overall Cognitive Status: Within Functional Limits for tasks assessed ?  ?  ?  ?  ?  ?  ?  ?  ?  ?  ?  ?  ?  ?  ?  ?  ?General Comments: AxO x 3 still sleepy but improvement from yesterday ?  ?  ? ?  ?Exercises   ? ?  ?General Comments   ?  ?  ? ?Pertinent Vitals/Pain Pain Assessment ?Pain Assessment: Faces ?Faces Pain Scale: Hurts even more ?Pain Location: R  knee ?Pain Descriptors / Indicators: Aching, Discomfort, Grimacing, Operative site guarding, Tender, Throbbing ?Pain Intervention(s): Monitored during session, Premedicated before session, Repositioned, Ice applied  ? ? ?Home Living   ?  ?  ?  ?  ?  ?  ?  ?  ?  ?   ?  ?Prior Function    ?  ?  ?   ? ?PT Goals (current goals can now be found in the care plan section) Progress towards PT goals: Progressing toward goals ? ?  ?Frequency ? ? ? 7X/week ? ? ? ?  ?PT Plan Current plan remains appropriate  ? ? ?Co-evaluation   ?  ?  ?  ?  ? ?  ?AM-PAC PT "6 Clicks" Mobility   ?Outcome Measure ? Help needed turning from your back  to your side while in a flat bed without using bedrails?: A Little ?Help needed moving from lying on your back to sitting on the side of a flat bed without using bedrails?: A Little ?Help needed moving to and from a bed to a chair (including a wheelchair)?: A Little ?Help needed standing up from a chair using your arms (e.g., wheelchair or bedside chair)?: A Little ?Help needed to walk in hospital room?: A Little ?Help needed climbing 3-5 steps with a railing? : A Little ?6 Click Score: 18 ? ?  ?End of Session Equipment Utilized During Treatment: Gait belt ?Activity Tolerance: Patient limited by lethargy ?Patient left: in bed;with bed alarm set ?Nurse Communication: Mobility status ?PT Visit Diagnosis: Unsteadiness on feet (R26.81);Other abnormalities of gait and mobility (R26.89);Other symptoms and signs involving the nervous system (R29.898) ?  ? ? ?Time: 4944-9675 ?PT Time Calculation (min) (ACUTE ONLY): 35 min ? ?Charges:  $Gait Training: 8-22 mins ?$Therapeutic Exercise: 8-22 mins          ?          ? ?Rica Koyanagi  PTA ?Acute  Rehabilitation Services ?Pager      [phone] ?Office      [phone] ? ? ? ? ?

## 2021-11-16 NOTE — Progress Notes (Signed)
Physical Therapy Treatment ?Patient Details ?Name: Alexandra [name] ?MRN: [id number] ?DOB: May 29, 1961 ?Today's Date: 11/16/2021 ? ? 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 PMH:  Right RCR,TIA,Back sg ? ?  ?PT Comments  ? ? POD # 3 pm session ?Assisted with amb in bathroom.  Slow but steady gait.  Pt was able to perform self peri care and Don/Doff underpants while maintaining a safe balance.  Had Spouse "hands on" assist pt in hallway and practice stairs.  Instructed to wear gait belt home. General stair comments: with Spouase present for "hands on" training using walker up forward at 75% VC's on proper walker placement and sequencing.  Pt was able to safely navigate without need for KI. Then returned to room to perform some TE's following HEP handout.  Instructed on proper tech, freq as well as use of ICE.   ?Addressed all mobility questions, discussed appropriate activity, educated on use of ICE.  Pt ready for D/C to home. ?  ?Recommendations for follow up therapy are one component of a multi-disciplinary discharge planning process, led by the attending physician.  Recommendations may be updated based on patient status, additional functional criteria and insurance authorization. ? ?Follow Up Recommendations ? Follow physician's recommendations for discharge plan and follow up therapies ?  ?  ?Assistance Recommended at Discharge Frequent or constant Supervision/Assistance  ?Patient can return home with the following Help with stairs or ramp for entrance;A little help with bathing/dressing/bathroom;A little help with walking and/or transfers;Assistance with cooking/housework;Assist for transportation ?  ?Equipment Recommendations ? None recommended by PT  ?  ?Recommendations for Other Services   ? ? ?  ?Precautions / Restrictions Precautions ?Precautions: Fall;Knee ?Precaution Comments: instructed no pillow under knee ?Restrictions ?Weight Bearing Restrictions: No ?RLE Weight Bearing: Weight  bearing as tolerated  ?  ? ?Mobility ? Bed Mobility ?Overal bed mobility: Needs Assistance ?Bed Mobility: Supine to Sit ?  ?  ?Supine to sit: Mod assist ?  ?  ?General bed mobility comments: Mod Assist with increased time.  Very slow to move. ?  ? ?Transfers ?Overall transfer level: Needs assistance ?Equipment used: Rolling walker (2 wheels) ?Transfers: Sit to/from Stand, Bed to chair/wheelchair/BSC ?Sit to Stand: Min assist ?  ?  ?  ?  ?  ?General transfer comment: first asissted from bed to [facility] to void then from [facility] to amb.  Very slow moving. ?  ? ?Ambulation/Gait ?Ambulation/Gait assistance: Min guard, Supervision ?Gait Distance (Feet): 30 Feet ?Assistive device: Rolling walker (2 wheels) ?Gait Pattern/deviations: Step-through pattern, Step-to pattern, Decreased step length - right, Decreased stance time - right ?Gait velocity: decreased ?  ?  ?General Gait Details: tolerated amb a functional of 30 feet at MinGuard Assist.  VERY slow but steady gait. ? ? ?Stairs ?Stairs: Yes ?  ?  ?Number of Stairs: 2 ?General stair comments: with Spouase present for "hands on" training using walker up forward at 75% VC's on proper walker placement and sequencing.  Pt was able to safely navigate without need for KI. ? ? ?Wheelchair Mobility ?  ? ?Modified Rankin (Stroke Patients Only) ?  ? ? ?  ?Balance   ?  ?  ?  ?  ?  ?  ?  ?  ?  ?  ?  ?  ?  ?  ?  ?  ?  ?  ?  ? ?  ?Cognition Arousal/Alertness: Awake/alert ?Behavior During Therapy: [facility] for tasks assessed/performed ?Overall Cognitive Status: Within Functional Limits for  tasks assessed ?  ?  ?  ?  ?  ?  ?  ?  ?  ?  ?  ?  ?  ?  ?  ?  ?General Comments: AxO x 3 still sleepy but improvement from yesterday ?  ?  ? ?  ?Exercises   ? ?  ?General Comments   ?  ?  ? ?Pertinent Vitals/Pain Pain Assessment ?Pain Assessment: Faces ?Faces Pain Scale: Hurts even more ?Pain Location: R knee ?Pain Descriptors / Indicators: Aching, Discomfort, Grimacing, Operative site guarding, Tender,  Throbbing ?Pain Intervention(s): Monitored during session, Premedicated before session, Repositioned, Ice applied  ? ? ?Home Living   ?  ?  ?  ?  ?  ?  ?  ?  ?  ?   ?  ?Prior Function    ?  ?  ?   ? ?PT Goals (current goals can now be found in the care plan section) Progress towards PT goals: Progressing toward goals ? ?  ?Frequency ? ? ? 7X/week ? ? ? ?  ?PT Plan Current plan remains appropriate  ? ? ?Co-evaluation   ?  ?  ?  ?  ? ?  ?AM-PAC PT "6 Clicks" Mobility   ?Outcome Measure ? Help needed turning from your back to your side while in a flat bed without using bedrails?: A Little ?Help needed moving from lying on your back to sitting on the side of a flat bed without using bedrails?: A Little ?Help needed moving to and from a bed to a chair (including a wheelchair)?: A Little ?Help needed standing up from a chair using your arms (e.g., wheelchair or bedside chair)?: A Little ?Help needed to walk in hospital room?: A Little ?Help needed climbing 3-5 steps with a railing? : A Little ?6 Click Score: 18 ? ?  ?End of Session Equipment Utilized During Treatment: Gait belt ?Activity Tolerance: Patient limited by lethargy ?Patient left: in bed;with bed alarm set ?Nurse Communication: Mobility status ?PT Visit Diagnosis: Unsteadiness on feet (R26.81);Other abnormalities of gait and mobility (R26.89);Other symptoms and signs involving the nervous system (R29.898) ?  ? ? ?Time: 1341-1414 ?PT Time Calculation (min) (ACUTE ONLY): 33 min ? ?Charges:  $Gait Training: 8-22 mins ?$Therapeutic Activity: 8-22 mins          ?          ?Rica Koyanagi  PTA ?Acute  Rehabilitation Services ?Pager      [phone] ?Office      [phone] ? ?

## 2021-11-16 NOTE — Discharge Summary (Signed)
Physician Discharge Summary  ?Patient ID: ?Alexandra [name] ?MRN: [id number] ?DOB/AGE: 11/23/1960 61 y.o. ? ?Admit date: 11/13/2021 ?Discharge date: 11/16/2021 ? ?Admission Diagnoses: right knee OA ? ?Discharge Diagnoses:  ?Principal Problem: ?  S/P total knee arthroplasty, right ? ? ?Discharged Condition: fair ? ?Hospital Course: Patient underwent a right TKA by Dr. Percell Miller on 3/57/01 without complications. She was supposed to go home the same day but had issues with mobility and pain management. These issues continued for 2 more days requiring her to be admitted. She has finally progressed enough to be cleared for discharge home with OPPT already set up.  ? ?Consults: None ? ?Significant Diagnostic Studies: radiology: Ultrasound: RLE negative for DVT ? ?Treatments: IV hydration, antibiotics: Ancef, analgesia: acetaminophen, Dilaudid, Morphine, Tramadol, and Oxycodone, anticoagulation: Xarelto, steroids: prednisone, therapies: PT and OT, and surgery: right TKA ? ?Discharge Exam: ?Blood pressure (!) 150/83, pulse 81, temperature (!) 95 ?F (35 ?C), resp. rate 17, height '5\' 1"'$  (1.549 m), weight 82.1 kg, SpO2 99 %. ?General appearance: alert, cooperative, and no distress ?Head: Normocephalic, without obvious abnormality, atraumatic ?Resp: clear to auscultation bilaterally ?Cardio: regular rate and rhythm, S1, S2 normal, no murmur, click, rub or gallop ?GI: soft, non-tender; bowel sounds normal; no masses,  no organomegaly ?Extremities: extremities normal, atraumatic, no cyanosis or edema ?Pulses:  ?L brachial 2+ R brachial 2+  ?L radial 2+ R radial 2+  ?L inguinal 2+ R inguinal 2+  ?L popliteal 2+ R popliteal 2+  ?L posterior tibial 2+ R posterior tibial 2+  ?L dorsalis pedis 2+ R dorsalis pedis 2+  ? ?Neurologic: Grossly normal ?Incision/Wound: c/d/i ? ?Disposition: Discharge disposition: 01-Home or Self Care ? ? ? ? ? ? ?Discharge Instructions   ? ? CPM   Complete by: As directed ?  ? Continuous passive motion  machine (CPM): ?     Use the CPM from 0 to 90 degrees for 6 hours per day. ?     You may break it up into 2 or 3 sessions per day. ?     Use CPM for 3 weeks or until you are told to stop.  ? Call MD / Call 911   Complete by: As directed ?  ? If you experience chest pain or shortness of breath, CALL 911 and be transported to the hospital emergency room.  If you develope a fever above 101 F, pus (white drainage) or increased drainage or redness at the wound, or calf pain, call your surgeon's office.  ? Call MD / Call 911   Complete by: As directed ?  ? If you experience chest pain or shortness of breath, CALL 911 and be transported to the hospital emergency room.  If you develope a fever above 101 F, pus (white drainage) or increased drainage or redness at the wound, or calf pain, call your surgeon's office.  ? Diet - low sodium heart healthy   Complete by: As directed ?  ? Discharge instructions   Complete by: As directed ?  ? You may bear weight as tolerated. ?Keep your dressing on and dry until follow up. ?Take medicine to prevent blood clots as directed. ?Take pain medicine as needed with the goal of transitioning to over the counter medicines.  ? ? ?INSTRUCTIONS AFTER JOINT REPLACEMENT  ? ?Remove items at home which could result in a fall. This includes throw rugs or furniture in walking pathways ?ICE to the affected joint every three hours while awake for 30 minutes at a  time, for at least the first 3-5 days, and then as needed for pain and swelling.  Continue to use ice for pain and swelling. You may notice swelling that will progress down to the foot and ankle.  This is normal after surgery.  Elevate your leg when you are not up walking on it.   ?Continue to use the breathing machine you got in the hospital (incentive spirometer) which will help keep your temperature down.  It is common for your temperature to cycle up and down following surgery, especially at night when you are not up moving around and  exerting yourself.  The breathing machine keeps your lungs expanded and your temperature down. ? ? ?DIET:  As you were doing prior to hospitalization, we recommend a well-balanced diet. ? ?DRESSING / WOUND CARE / SHOWERING ? ?You may shower 3 days after surgery, but keep the wounds dry during showering.  You may use an occlusive plastic wrap (Press'n Seal for example) with blue painter's tape at edges, NO SOAKING/SUBMERGING IN THE BATHTUB.  If the bandage gets wet, call the office.  ? ?ACTIVITY ? ?Increase activity slowly as tolerated, but follow the weight bearing instructions below.   ?No driving for 6 weeks or until further direction given by your physician.  You cannot drive while taking narcotics.  ?No lifting or carrying greater than 10 lbs. until further directed by your surgeon. ?Avoid periods of inactivity such as sitting longer than an hour when not asleep. This helps prevent blood clots.  ?You may return to work once you are authorized by your doctor.  ? ? ?WEIGHT BEARING  ? ?Weight bearing as tolerated with assist device (walker, cane, etc) as directed, use it as long as suggested by your surgeon or therapist, typically at least 4-6 weeks. ? ? ?EXERCISES ? ?Results after joint replacement surgery are often greatly improved when you follow the exercise, range of motion and muscle strengthening exercises prescribed by your doctor. Safety measures are also important to protect the joint from further injury. Any time any of these exercises cause you to have increased pain or swelling, decrease what you are doing until you are comfortable again and then slowly increase them. If you have problems or questions, call your caregiver or physical therapist for advice.  ? ?Rehabilitation is important following a joint replacement. After just a few days of immobilization, the muscles of the leg can become weakened and shrink (atrophy).  These exercises are designed to build up the tone and strength of the thigh and  leg muscles and to improve motion. Often times heat used for twenty to thirty minutes before working out will loosen up your tissues and help with improving the range of motion but do not use heat for the first two weeks following surgery (sometimes heat can increase post-operative swelling).  ? ?These exercises can be done on a training (exercise) mat, on the floor, on a table or on a bed. Use whatever works the best and is most comfortable for you.    Use music or television while you are exercising so that the exercises are a pleasant break in your day. This will make your life better with the exercises acting as a break in your routine that you can look forward to.   Perform all exercises about fifteen times, three times per day or as directed.  You should exercise both the operative leg and the other leg as well. ? ?Exercises include: ?  ?Quad Sets - Tighten  up the muscle on the front of the thigh (Quad) and hold for 5-10 seconds.   ?Straight Leg Raises - With your knee straight (if you were given a brace, keep it on), lift the leg to 60 degrees, hold for 3 seconds, and slowly lower the leg.  Perform this exercise against resistance later as your leg gets stronger.  ?Leg Slides: Lying on your back, slowly slide your foot toward your buttocks, bending your knee up off the floor (only go as far as is comfortable). Then slowly slide your foot back down until your leg is flat on the floor again.  ?Angel Wings: Lying on your back spread your legs to the side as far apart as you can without causing discomfort.  ?Hamstring Strength:  Lying on your back, push your heel against the floor with your leg straight by tightening up the muscles of your buttocks.  Repeat, but this time bend your knee to a comfortable angle, and push your heel against the floor.  You may put a pillow under the heel to make it more comfortable if necessary.  ? ?A rehabilitation program following joint replacement surgery can speed recovery and  prevent re-injury in the future due to weakened muscles. Contact your doctor or a physical therapist for more information on knee rehabilitation.  ? ? ?CONSTIPATION ? ?Constipation is defined medically as fewer tha

## 2021-11-16 NOTE — Progress Notes (Signed)
? ? ?Subjective: ?Patient reports pain is still severe. Getting all possible medicines around the clock but still having severe pain. Continues to look very sedated but easily awoken.  Still having some incontinence issues when standing. No CP, SOB.  Working with PT on [place] but progress has been greatly limited by pain. She still hasn't walked the hallway or tried the stairs. ? ?Doppler ultrasound was negative for DVT. Her labs came back showing some low levels of electrolytes so I ordered a fluid bolus, multivitamin, and some prednisone to help with the pain. ? ?Objective:  ? ?VITALS:   ?Vitals:  ? 11/15/21 1822 11/15/21 2126 11/16/21 0559 11/16/21 0951  ?BP: (!) 161/88 136/79 127/74 128/72  ?Pulse: 88 91 81 86  ?Resp: '16 17 17   '$ ?Temp:  98.8 ?F (37.1 ?C) 99.4 ?F (37.4 ?C)   ?TempSrc:  Oral Oral   ?SpO2: 95% 98% 100%   ?Weight:      ?Height:      ? ? ?  Latest Ref Rng & Units 11/16/2021  ?  3:34 AM 11/15/2021  ?  4:48 PM 10/31/2021  ?  2:45 PM  ?CBC  ?WBC 4.0 - 10.5 K/uL 9.7   10.1   10.1    ?Hemoglobin 12.0 - 15.0 g/dL 12.9   12.9   13.3    ?Hematocrit 36.0 - 46.0 % 37.2   37.8   39.2    ?Platelets 150 - 400 K/uL 298   319   369    ? ? ?  Latest Ref Rng & Units 11/16/2021  ?  3:34 AM 11/15/2021  ?  4:48 PM 10/31/2021  ?  2:45 PM  ?BMP  ?Glucose 70 - 99 mg/dL 215   199   82    ?BUN 8 - 23 mg/dL '9   10   13    '$ ?Creatinine 0.44 - 1.00 mg/dL 0.62   0.81   0.78    ?Sodium 135 - 145 mmol/L 136   137   140    ?Potassium 3.5 - 5.1 mmol/L 3.9   3.2   3.5    ?Chloride 98 - 111 mmol/L 97   95   103    ?CO2 22 - 32 mmol/L 32   31   29    ?Calcium 8.9 - 10.3 mg/dL 8.4   8.7   9.0    ? ?Intake/Output   ?   04/20 0701 ?04/21 0700 04/21 0701 ?04/22 0700  ? P.O. 360 130  ? IV Piggyback 0   ? Total Intake(mL/kg) 360 (4.4) 130 (1.6)  ? Urine (mL/kg/hr) 750 (0.4) 600 (1.2)  ? Stool 0   ? Total Output 750 600  ? Net -390 -470  ?     ? Urine Occurrence 1 x 1 x  ? Stool Occurrence 0 x   ?  ? ? ?Physical Exam: ?General: NAD.  Sleeping  in bed, drowsy but easily awoken ?Resp: No increased wob ?Cardio: regular rate and rhythm ?ABD soft ?Neurologically intact ?MSK ?Neurovascularly intact ?Sensation intact distally ?Intact pulses distally ?Dorsiflexion/Plantar flexion intact ?Calf soft and compressible but very TTP ?Wearing TED hose and knee wrapped in ACE bandage ?Incision: dressing C/D/I ? ? ?Assessment: ?3 Days Post-Op  ?S/P Procedure(s) (LRB): ?TOTAL KNEE ARTHROPLASTY (Right) ?by Dr. Ernesta Amble. Murphy on 11/13/21 ? ?Principal Problem: ?  S/P total knee arthroplasty, right ? ? ? ?Plan: ?Work on pain control without giving too much medicine which is causing over sedation ? ?  Continue to push CPM use and mobilization. Emphasized the importance of pushing through the pain and that she has to move.  ? ?I think the urinary incontinence is likely due to anesthesia and continued inpatient medications being given. If it continues, she will likely need an outpatient referral to urology. ? ?Advance diet ?Up with therapy ?Incentive Spirometry ?Elevate and Apply ice ? ?Weightbearing: WBAT RLE ?Insicional and dressing care: Dressings left intact until follow-up and Reinforce dressings as needed ?Orthopedic device(s): None ?Showering: Keep dressing dry ?VTE prophylaxis:  Xarelto '10mg'$  daily   x 30 days , SCDs, ambulation ?Pain control: Tylenol, Oxy, Ultram, Dilaudid ?Follow - up plan: 2 weeks ?Contact information:  Edmonia Lynch MD, Aggie Moats PA-C ? ?Dispo: Home later today ? ? ?Britt Bottom, PA-C ?Office [phone] ?11/16/2021, 1:09 PM  ?

## 2021-11-21 DIAGNOSIS — R262 Difficulty in walking, not elsewhere classified: Secondary | ICD-10-CM | POA: Diagnosis not present

## 2021-11-21 DIAGNOSIS — M1711 Unilateral primary osteoarthritis, right knee: Secondary | ICD-10-CM | POA: Diagnosis not present

## 2021-11-21 DIAGNOSIS — M25661 Stiffness of right knee, not elsewhere classified: Secondary | ICD-10-CM | POA: Diagnosis not present

## 2021-11-21 DIAGNOSIS — M6281 Muscle weakness (generalized): Secondary | ICD-10-CM | POA: Diagnosis not present

## 2021-11-21 DIAGNOSIS — Z96651 Presence of right artificial knee joint: Secondary | ICD-10-CM | POA: Diagnosis not present

## 2021-11-27 ENCOUNTER — Other Ambulatory Visit: Payer: Self-pay | Admitting: Adult Health

## 2021-11-27 DIAGNOSIS — E1140 Type 2 diabetes mellitus with diabetic neuropathy, unspecified: Secondary | ICD-10-CM

## 2021-11-28 DIAGNOSIS — Z79899 Other long term (current) drug therapy: Secondary | ICD-10-CM | POA: Diagnosis not present

## 2021-11-28 DIAGNOSIS — M25561 Pain in right knee: Secondary | ICD-10-CM | POA: Diagnosis not present

## 2021-11-28 DIAGNOSIS — G629 Polyneuropathy, unspecified: Secondary | ICD-10-CM | POA: Diagnosis not present

## 2021-11-28 DIAGNOSIS — M1711 Unilateral primary osteoarthritis, right knee: Secondary | ICD-10-CM | POA: Diagnosis not present

## 2021-11-29 DIAGNOSIS — R262 Difficulty in walking, not elsewhere classified: Secondary | ICD-10-CM | POA: Diagnosis not present

## 2021-11-29 DIAGNOSIS — M1711 Unilateral primary osteoarthritis, right knee: Secondary | ICD-10-CM | POA: Diagnosis not present

## 2021-11-29 DIAGNOSIS — M25661 Stiffness of right knee, not elsewhere classified: Secondary | ICD-10-CM | POA: Diagnosis not present

## 2021-11-29 DIAGNOSIS — Z96651 Presence of right artificial knee joint: Secondary | ICD-10-CM | POA: Diagnosis not present

## 2021-11-29 DIAGNOSIS — M6281 Muscle weakness (generalized): Secondary | ICD-10-CM | POA: Diagnosis not present

## 2021-12-05 DIAGNOSIS — M1711 Unilateral primary osteoarthritis, right knee: Secondary | ICD-10-CM | POA: Diagnosis not present

## 2021-12-05 DIAGNOSIS — M25661 Stiffness of right knee, not elsewhere classified: Secondary | ICD-10-CM | POA: Diagnosis not present

## 2021-12-05 DIAGNOSIS — Z96651 Presence of right artificial knee joint: Secondary | ICD-10-CM | POA: Diagnosis not present

## 2021-12-05 DIAGNOSIS — R262 Difficulty in walking, not elsewhere classified: Secondary | ICD-10-CM | POA: Diagnosis not present

## 2021-12-05 DIAGNOSIS — M6281 Muscle weakness (generalized): Secondary | ICD-10-CM | POA: Diagnosis not present

## 2021-12-10 DIAGNOSIS — M1711 Unilateral primary osteoarthritis, right knee: Secondary | ICD-10-CM | POA: Diagnosis not present

## 2021-12-10 DIAGNOSIS — R262 Difficulty in walking, not elsewhere classified: Secondary | ICD-10-CM | POA: Diagnosis not present

## 2021-12-10 DIAGNOSIS — Z96651 Presence of right artificial knee joint: Secondary | ICD-10-CM | POA: Diagnosis not present

## 2021-12-10 DIAGNOSIS — M25661 Stiffness of right knee, not elsewhere classified: Secondary | ICD-10-CM | POA: Diagnosis not present

## 2021-12-10 DIAGNOSIS — M6281 Muscle weakness (generalized): Secondary | ICD-10-CM | POA: Diagnosis not present

## 2021-12-13 DIAGNOSIS — M6281 Muscle weakness (generalized): Secondary | ICD-10-CM | POA: Diagnosis not present

## 2021-12-13 DIAGNOSIS — Z96651 Presence of right artificial knee joint: Secondary | ICD-10-CM | POA: Diagnosis not present

## 2021-12-13 DIAGNOSIS — M25661 Stiffness of right knee, not elsewhere classified: Secondary | ICD-10-CM | POA: Diagnosis not present

## 2021-12-13 DIAGNOSIS — M1711 Unilateral primary osteoarthritis, right knee: Secondary | ICD-10-CM | POA: Diagnosis not present

## 2021-12-13 DIAGNOSIS — R262 Difficulty in walking, not elsewhere classified: Secondary | ICD-10-CM | POA: Diagnosis not present

## 2021-12-16 ENCOUNTER — Other Ambulatory Visit: Payer: Self-pay | Admitting: Adult Health

## 2021-12-18 DIAGNOSIS — M25661 Stiffness of right knee, not elsewhere classified: Secondary | ICD-10-CM | POA: Diagnosis not present

## 2021-12-18 DIAGNOSIS — R262 Difficulty in walking, not elsewhere classified: Secondary | ICD-10-CM | POA: Diagnosis not present

## 2021-12-18 DIAGNOSIS — Z96651 Presence of right artificial knee joint: Secondary | ICD-10-CM | POA: Diagnosis not present

## 2021-12-18 DIAGNOSIS — M1711 Unilateral primary osteoarthritis, right knee: Secondary | ICD-10-CM | POA: Diagnosis not present

## 2021-12-18 DIAGNOSIS — M6281 Muscle weakness (generalized): Secondary | ICD-10-CM | POA: Diagnosis not present

## 2021-12-20 DIAGNOSIS — M1711 Unilateral primary osteoarthritis, right knee: Secondary | ICD-10-CM | POA: Diagnosis not present

## 2021-12-20 DIAGNOSIS — M25661 Stiffness of right knee, not elsewhere classified: Secondary | ICD-10-CM | POA: Diagnosis not present

## 2021-12-20 DIAGNOSIS — R262 Difficulty in walking, not elsewhere classified: Secondary | ICD-10-CM | POA: Diagnosis not present

## 2021-12-20 DIAGNOSIS — M6281 Muscle weakness (generalized): Secondary | ICD-10-CM | POA: Diagnosis not present

## 2021-12-20 DIAGNOSIS — Z96651 Presence of right artificial knee joint: Secondary | ICD-10-CM | POA: Diagnosis not present

## 2021-12-25 DIAGNOSIS — M1711 Unilateral primary osteoarthritis, right knee: Secondary | ICD-10-CM | POA: Diagnosis not present

## 2021-12-25 DIAGNOSIS — R262 Difficulty in walking, not elsewhere classified: Secondary | ICD-10-CM | POA: Diagnosis not present

## 2021-12-25 DIAGNOSIS — M6281 Muscle weakness (generalized): Secondary | ICD-10-CM | POA: Diagnosis not present

## 2021-12-25 DIAGNOSIS — M25661 Stiffness of right knee, not elsewhere classified: Secondary | ICD-10-CM | POA: Diagnosis not present

## 2021-12-25 DIAGNOSIS — Z96651 Presence of right artificial knee joint: Secondary | ICD-10-CM | POA: Diagnosis not present

## 2021-12-26 DIAGNOSIS — Z79899 Other long term (current) drug therapy: Secondary | ICD-10-CM | POA: Diagnosis not present

## 2021-12-26 DIAGNOSIS — M79651 Pain in right thigh: Secondary | ICD-10-CM | POA: Diagnosis not present

## 2021-12-26 DIAGNOSIS — G629 Polyneuropathy, unspecified: Secondary | ICD-10-CM | POA: Diagnosis not present

## 2021-12-26 DIAGNOSIS — M25561 Pain in right knee: Secondary | ICD-10-CM | POA: Diagnosis not present

## 2021-12-27 DIAGNOSIS — Z96651 Presence of right artificial knee joint: Secondary | ICD-10-CM | POA: Diagnosis not present

## 2021-12-27 DIAGNOSIS — M1711 Unilateral primary osteoarthritis, right knee: Secondary | ICD-10-CM | POA: Diagnosis not present

## 2021-12-27 DIAGNOSIS — M6281 Muscle weakness (generalized): Secondary | ICD-10-CM | POA: Diagnosis not present

## 2021-12-27 DIAGNOSIS — R262 Difficulty in walking, not elsewhere classified: Secondary | ICD-10-CM | POA: Diagnosis not present

## 2021-12-27 DIAGNOSIS — M25661 Stiffness of right knee, not elsewhere classified: Secondary | ICD-10-CM | POA: Diagnosis not present

## 2021-12-31 DIAGNOSIS — M25661 Stiffness of right knee, not elsewhere classified: Secondary | ICD-10-CM | POA: Diagnosis not present

## 2021-12-31 DIAGNOSIS — M1711 Unilateral primary osteoarthritis, right knee: Secondary | ICD-10-CM | POA: Diagnosis not present

## 2021-12-31 DIAGNOSIS — R262 Difficulty in walking, not elsewhere classified: Secondary | ICD-10-CM | POA: Diagnosis not present

## 2021-12-31 DIAGNOSIS — M6281 Muscle weakness (generalized): Secondary | ICD-10-CM | POA: Diagnosis not present

## 2021-12-31 DIAGNOSIS — Z96651 Presence of right artificial knee joint: Secondary | ICD-10-CM | POA: Diagnosis not present

## 2022-01-09 DIAGNOSIS — M6281 Muscle weakness (generalized): Secondary | ICD-10-CM | POA: Diagnosis not present

## 2022-01-09 DIAGNOSIS — M25661 Stiffness of right knee, not elsewhere classified: Secondary | ICD-10-CM | POA: Diagnosis not present

## 2022-01-09 DIAGNOSIS — Z96651 Presence of right artificial knee joint: Secondary | ICD-10-CM | POA: Diagnosis not present

## 2022-01-09 DIAGNOSIS — R262 Difficulty in walking, not elsewhere classified: Secondary | ICD-10-CM | POA: Diagnosis not present

## 2022-01-09 DIAGNOSIS — M1711 Unilateral primary osteoarthritis, right knee: Secondary | ICD-10-CM | POA: Diagnosis not present

## 2022-01-11 DIAGNOSIS — R262 Difficulty in walking, not elsewhere classified: Secondary | ICD-10-CM | POA: Diagnosis not present

## 2022-01-11 DIAGNOSIS — M6281 Muscle weakness (generalized): Secondary | ICD-10-CM | POA: Diagnosis not present

## 2022-01-11 DIAGNOSIS — M1711 Unilateral primary osteoarthritis, right knee: Secondary | ICD-10-CM | POA: Diagnosis not present

## 2022-01-11 DIAGNOSIS — Z96651 Presence of right artificial knee joint: Secondary | ICD-10-CM | POA: Diagnosis not present

## 2022-01-11 DIAGNOSIS — M25661 Stiffness of right knee, not elsewhere classified: Secondary | ICD-10-CM | POA: Diagnosis not present

## 2022-01-16 ENCOUNTER — Ambulatory Visit (INDEPENDENT_AMBULATORY_CARE_PROVIDER_SITE_OTHER): Payer: Medicare Other | Admitting: Adult Health

## 2022-01-16 ENCOUNTER — Telehealth: Payer: Self-pay

## 2022-01-16 ENCOUNTER — Encounter: Payer: Self-pay | Admitting: Adult Health

## 2022-01-16 VITALS — BP 138/80 | HR 80 | Temp 98.4°F | Ht 61.0 in | Wt 167.0 lb

## 2022-01-16 DIAGNOSIS — M25661 Stiffness of right knee, not elsewhere classified: Secondary | ICD-10-CM | POA: Diagnosis not present

## 2022-01-16 DIAGNOSIS — M1711 Unilateral primary osteoarthritis, right knee: Secondary | ICD-10-CM | POA: Diagnosis not present

## 2022-01-16 DIAGNOSIS — E162 Hypoglycemia, unspecified: Secondary | ICD-10-CM

## 2022-01-16 DIAGNOSIS — E1140 Type 2 diabetes mellitus with diabetic neuropathy, unspecified: Secondary | ICD-10-CM | POA: Diagnosis not present

## 2022-01-16 DIAGNOSIS — Z96651 Presence of right artificial knee joint: Secondary | ICD-10-CM | POA: Diagnosis not present

## 2022-01-16 DIAGNOSIS — R262 Difficulty in walking, not elsewhere classified: Secondary | ICD-10-CM | POA: Diagnosis not present

## 2022-01-16 DIAGNOSIS — R197 Diarrhea, unspecified: Secondary | ICD-10-CM

## 2022-01-16 DIAGNOSIS — M6281 Muscle weakness (generalized): Secondary | ICD-10-CM | POA: Diagnosis not present

## 2022-01-16 MED ORDER — DICYCLOMINE HCL 20 MG PO TABS: 20.0000 mg | ORAL_TABLET | Freq: Three times a day (TID) | ORAL | 0 refills | Status: DC

## 2022-01-16 MED ORDER — FREESTYLE LIBRE 3 SENSOR MISC: 1.0000 | 3 refills | Status: DC

## 2022-01-16 MED ORDER — FREESTYLE LIBRE 3 SENSOR MISC: 1.0000 | 0 refills | Status: DC

## 2022-01-16 MED ORDER — CIPROFLOXACIN HCL 500 MG PO TABS: 500.0000 mg | ORAL_TABLET | Freq: Two times a day (BID) | ORAL | 0 refills | Status: AC

## 2022-01-16 NOTE — Progress Notes (Signed)
Subjective:    Patient ID: Alexandra [name], female    DOB: 10/05/1960, 61 y.o.   MRN: [id number]  HPI 61 year old female who  has a past medical history of Allergy, Anemia, Blood transfusion without reported diagnosis, Diabetes mellitus ([place]), Fatty liver, GERD (gastroesophageal reflux disease), Hiatal hernia (07/29/1996), Hyperlipidemia, Hyperplastic colon polyp, Hypertension, Hypothyroidism, Neuromuscular disorder ([place]), Rotator cuff tear, right, and Stroke ([place]).  She presents to the office today for an acute issue of diarrhea x 1 week and episodes of hypoglycemia.   She reports that her diarrhea started roughly one week ago, two days after she got back from [place]. He reports having 4-5 bowel movements a day that are watery with associated abdominal cramping. She does not remember eating anything questionable in [place].  She has had some nausea but no fevers, chills, or vomiting.  At home she took Pepto-Bismol without improvement.  She also reports some fluctuations in her blood sugars with more recent hypoglycemic episodes into the 40s and 50s this is happening usually late morning/early afternoon.  She is using the [place] 2 sensor to manage her blood sugars but would like to be switched over to the [place] 3.  In the office her blood sugars dropped into the high 40s low 50s momentarily.  She did have physical therapy this morning after her knee surgery and did not have any food we were able to bring her blood sugars up to a safe level and her symptoms improved after p.o. intake.  Diabetes is currently managed with Trulicity 3 mg weekly, glipizide 10 mg extended release daily, and metformin 1000 mg twice daily  Review of Systems See HPI   Past Medical History:  Diagnosis Date   Allergy    Anemia    past hx   Blood transfusion without reported diagnosis    Diabetes mellitus ([place])    Fatty liver    GERD (gastroesophageal reflux disease)    prn med.   Hiatal hernia 07/29/1996    Hyperlipidemia    Hyperplastic colon polyp    Hypertension    new dx. - will start med. 06/28/2011   Hypothyroidism    Neuromuscular disorder (HCC)    hemiplegia , neuropathy   Rotator cuff tear, right    Stroke [facility])    TIA 2016    Social History   Socioeconomic History   Marital status: Married    Spouse name: Not on file   Number of children: Not on file   Years of education: Not on file   Highest education level: Bachelor's degree (e.g., BA, AB, BS)  Occupational History   Occupation: unemployed  Tobacco Use   Smoking status: Former    Packs/day: 1.00    Years: 10.00    Total pack years: 10.00    Types: Cigarettes    Quit date: 07/29/1984    Years since quitting: 37.4   Smokeless tobacco: Never  Vaping Use   Vaping Use: Never used  Substance and Sexual Activity   Alcohol use: Yes    Comment: occassionally   Drug use: No   Sexual activity: Yes  Other Topics Concern   Not on file  Social History Narrative   Married for 32 years    Six children- all live around here. Two daughters live with her   66 grand children    On Disability for mental issues   No pets   Likes to go to movies and out to dinner. She likes to do  puzzle books. Likes to read      She eats a healthy diet   She does not exercise    Lives at home with husband and granddaughter   Caffeine: 4 cups/day   Social Determinants of Health   Financial Resource Strain: Low Risk  (08/16/2021)   Overall Financial Resource Strain (CARDIA)    Difficulty of Paying Living Expenses: Not very hard  Food Insecurity: No Food Insecurity (08/16/2021)   Hunger Vital Sign    Worried About Running Out of Food in the Last Year: Never true    Ran Out of Food in the Last Year: Never true  Transportation Needs: No Transportation Needs (08/16/2021)   PRAPARE - Hydrologist (Medical): No    Lack of Transportation (Non-Medical): No  Physical Activity: Unknown (08/16/2021)   Exercise Vital Sign     Days of Exercise per Week: 0 days    Minutes of Exercise per Session: Not on file  Stress: No Stress Concern Present (08/16/2021)   [place]    Feeling of Stress : Not at all  Social Connections: [place] (08/16/2021)   Social Connection and Isolation Panel [NHANES]    Frequency of Communication with Friends and Family: More than three times a week    Frequency of Social Gatherings with Friends and Family: More than three times a week    Attends Religious Services: More than 4 times per year    Active Member of Clubs or Organizations: Yes    Attends Music therapist: More than 4 times per year    Marital Status: Married  Human resources officer Violence: Not on file    Past Surgical History:  Procedure Laterality Date   ABDOMINAL HYSTERECTOMY  06/25/2000   left salpingo-oophorectomy   BACK SURGERY  07/30/1995   discetomy, lumbar spinal fusion   CARDIAC CATHETERIZATION  04/01/2006   CESAREAN SECTION  07/29/1989   CHOLECYSTECTOMY  07/30/1987   COLONOSCOPY     EXPLORATORY LAPAROTOMY  05/08/2000   right salpingo-oophorectomy   LAMINOTOMY / EXCISION DISK POSTERIOR CERVICAL SPINE  08/10/2009   C7-T1   MENISCUS REPAIR Right 04/2021   Knee   POLYPECTOMY     HPP 2012   ROUX-EN-Y GASTRIC BYPASS  05/19/2007   SHOULDER ARTHROSCOPY  01/02/2006; 05/31/2004   left 2007; right 2005   SHOULDER ARTHROSCOPY  07/02/2011   Procedure: ARTHROSCOPY SHOULDER;  Surgeon: Cammie Sickle., MD;  Location: [facility];  Service: Orthopedics;  Laterality: Right;  repair supraspinatus, Debride glenohumeral joint, labrum   SHOULDER ARTHROSCOPY DISTAL CLAVICLE EXCISION AND OPEN ROTATOR CUFF REPAIR  07/26/2010   right   THYROID SURGERY  early 2000s   goiter   TOTAL 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/2021   Procedure: TOTAL KNEE ARTHROPLASTY;  Surgeon: Renette Butters, MD;  Location: [facility];  Service:  Orthopedics;  Laterality: Right;   TUBAL LIGATION  1993    Family History  Problem Relation Age of Onset   Diabetes Mother    Hyperlipidemia Mother    Hypertension Mother    Thyroid disease Mother    Rheum arthritis Mother    COPD Mother    Cancer Paternal Grandfather        Prostate Cancer   Coronary artery disease Other    Colon cancer Neg Hx    Colon polyps Neg Hx    Esophageal cancer Neg Hx    Rectal cancer Neg Hx  Stomach cancer Neg Hx    Pancreatic cancer Neg Hx    Sleep apnea Neg Hx     Allergies  Allergen Reactions   Shrimp (Diagnostic) Anaphylaxis    Eating shrimp causes swelling of tongue and rash   Ace Inhibitors Cough    Current Outpatient Medications on File Prior to Visit  Medication Sig Dispense Refill   acetaminophen (TYLENOL) 500 MG tablet Take 2 tablets (1,000 mg total) by mouth every 6 (six) hours as needed for mild pain or moderate pain. 60 tablet 0   B Complex Vitamins (B COMPLEX PO) Take 1 tablet by mouth daily.     BIOTIN PO Take 1 tablet by mouth daily.     bisoprolol-hydrochlorothiazide (ZIAC) 10-6.25 MG tablet Take 1 tablet by mouth daily. 90 tablet 3   Blood Glucose Monitoring Suppl (ONETOUCH VERIO) w/Device KIT 1 Device by Does not apply route daily. Use to check blood glucose 4 times day or prn 1 kit 0   Cholecalciferol (VITAMIN D3 PO) Take 1 tablet by mouth daily.     Continuous Blood Gluc Sensor (FREESTYLE LIBRE 14 DAY SENSOR) MISC Use with smart phoen 6 each 3   diclofenac Sodium (VOLTAREN) 1 % GEL 2 g 3 (three) times daily as needed (pain).     docusate sodium (COLACE) 100 MG capsule Take 100 mg by mouth at bedtime.     Dulaglutide (TRULICITY) 3 YK/9.9IP SOPN Inject 3 mg as directed once a week. 3 mL 3   DULoxetine (CYMBALTA) 30 MG capsule Take 30 mg by mouth every morning.     ferrous sulfate 325 (65 FE) MG EC tablet Take 325 mg by mouth daily with breakfast.     fluticasone (FLONASE) 50 MCG/ACT nasal spray USE 2 SPRAYS IN EACH NOSTRIL  DAILY (Patient taking differently: 2 sprays daily as needed for allergies or rhinitis.) 48 g 3   gabapentin (NEURONTIN) 400 MG capsule TAKE 2 CAPSULES THREE TIMES A DAY 540 capsule 3   glipiZIDE (GLUCOTROL XL) 10 MG 24 hr tablet TAKE 1 TABLET BY MOUTH DAILY  WITH BREAKFAST 90 tablet 3   glipiZIDE (GLUCOTROL XL) 5 MG 24 hr tablet TAKE 1 TABLET BY MOUTH EVERY DAY IN THE EVENING 90 tablet 0   glucose blood (ONETOUCH VERIO) test strip Use to check blood sugar 4times a day or PRn 300 strip 3   HYDROcodone-acetaminophen (NORCO/VICODIN) 5-325 MG tablet Take 1 tablet by mouth 3 (three) times daily.     levothyroxine (SYNTHROID) 112 MCG tablet Take 1 tablet (112 mcg total) by mouth daily. 90 tablet 3   metFORMIN (GLUCOPHAGE) 1000 MG tablet TAKE 1 TABLET BY MOUTH DAILY  WITH BREAKFAST 90 tablet 3   methocarbamol (ROBAXIN-750) 750 MG tablet Take 1 tablet (750 mg total) by mouth every 8 (eight) hours as needed for muscle spasms. 20 tablet 0   ondansetron (ZOFRAN-ODT) 4 MG disintegrating tablet Take 1 tablet (4 mg total) by mouth 2 (two) times daily as needed for nausea or vomiting. 10 tablet 0   ONE TOUCH LANCETS MISC Test blood sugars once daily Dx: E11.9 100 each 3   oxybutynin (DITROPAN-XL) 5 MG 24 hr tablet Take 1 tablet (5 mg total) by mouth at bedtime. 90 tablet 3   oxyCODONE (ROXICODONE) 5 MG immediate release tablet Take 1 tablet (5 mg total) by mouth every 6 (six) hours as needed for severe pain. Do not take more than 6 tablets in a 24 hour period. 28 tablet 0   pantoprazole (PROTONIX)  40 MG tablet Take 1 tablet (40 mg total) by mouth daily. 100 tablet 2   risperiDONE (RISPERDAL) 1 MG tablet Take 1 mg by mouth 2 (two) times daily.     rivaroxaban (XARELTO) 10 MG TABS tablet Take 1 tablet (10 mg total) by mouth daily. To prevent blood clots after surgery. 30 tablet 0   simvastatin (ZOCOR) 20 MG tablet Take 1 tablet (20 mg total) by mouth at bedtime. 90 tablet 3   traZODone (DESYREL) 100 MG tablet Take  200 mg by mouth at bedtime.     VITAMIN E PO Take 1 capsule by mouth daily.     No current facility-administered medications on file prior to visit.    BP 138/80   Pulse 80   Temp 98.4 F (36.9 C) (Oral)   Ht _0  (1.549 m)   Wt 167 lb (75.8 kg)   SpO2 98%   BMI 31.55 kg/m       Objective:   Physical Exam Vitals and nursing note reviewed.  Constitutional:      Appearance: Normal appearance.  Cardiovascular:     Rate and Rhythm: Normal rate and regular rhythm.     Pulses: Normal pulses.     Heart sounds: Normal heart sounds.  Pulmonary:     Effort: Pulmonary effort is normal.     Breath sounds: Normal breath sounds.  Abdominal:     General: Abdomen is flat. Bowel sounds are increased.     Palpations: Abdomen is soft.     Tenderness: There is abdominal tenderness (generalized).  Musculoskeletal:        General: Normal range of motion.  Skin:    General: Skin is warm and dry.     Capillary Refill: Capillary refill takes less than 2 seconds.  Neurological:     General: No focal deficit present.     Mental Status: She is alert and oriented to person, place, and time.  Psychiatric:        Mood and Affect: Mood normal.        Behavior: Behavior normal.        Thought Content: Thought content normal.        Judgment: Judgment normal.       Assessment & Plan:  1. Diarrhea, unspecified type - Concern for GI infection from food while in [place].  - Follow up if symptoms do not resolve - Basic Metabolic Panel; Future - ciprofloxacin (CIPRO) 500 MG tablet; Take 1 tablet (500 mg total) by mouth 2 (two) times daily for 5 days.  Dispense: 10 tablet; Refill: 0 - dicyclomine (BENTYL) 20 MG tablet; Take 1 tablet (20 mg total) by mouth 3 (three) times daily before meals for 5 days.  Dispense: 15 tablet; Refill: 0 - CBC with Differential/Platelet; Future  2. Type 2 diabetes mellitus with diabetic neuropathy, without long-term current use of insulin (HCC) - Will have her cut  back on glipizide to 5 mg daily.  - Eat something before PT  - Continuous Blood Gluc Sensor (FREESTYLE LIBRE 3 SENSOR) MISC; 1 Device by Does not apply route every 14 (fourteen) days. Place 1 sensor on the skin every 14 days. Use to check glucose continuously  Dispense: 6 each; Refill: 3  3. Hypoglycemia   Dorothyann Peng, NP

## 2022-01-16 NOTE — Telephone Encounter (Signed)
---  Patient has had diarrhea since Wednesday. Patient has a glucose of 58. Caller states no other sx.  01/15/2022 4:09:39 PM See PCP within 24 Hours [place], RN, Fransico Meadow  Comments User: Fransico Meadow, Laurann Montana, RN Date/Time Eilene Ghazi Time): 01/15/2022 4:06:53 PM RN upgraded outcome dt patient states this has been happening, but not sure why.  User: Fransico Meadow, Laurann Montana, RN Date/Time Eilene Ghazi Time): 01/15/2022 4:07:17 PM Patient's glucose is 98.  Referrals GO TO FACILITY UNDECIDED REFERRED TO PCP OFFICE  Pt has appt with PCP on 01/16/22

## 2022-01-18 DIAGNOSIS — M1711 Unilateral primary osteoarthritis, right knee: Secondary | ICD-10-CM | POA: Diagnosis not present

## 2022-01-18 DIAGNOSIS — M25661 Stiffness of right knee, not elsewhere classified: Secondary | ICD-10-CM | POA: Diagnosis not present

## 2022-01-18 DIAGNOSIS — M6281 Muscle weakness (generalized): Secondary | ICD-10-CM | POA: Diagnosis not present

## 2022-01-18 DIAGNOSIS — Z96651 Presence of right artificial knee joint: Secondary | ICD-10-CM | POA: Diagnosis not present

## 2022-01-18 DIAGNOSIS — R262 Difficulty in walking, not elsewhere classified: Secondary | ICD-10-CM | POA: Diagnosis not present

## 2022-01-21 DIAGNOSIS — M6281 Muscle weakness (generalized): Secondary | ICD-10-CM | POA: Diagnosis not present

## 2022-01-21 DIAGNOSIS — R262 Difficulty in walking, not elsewhere classified: Secondary | ICD-10-CM | POA: Diagnosis not present

## 2022-01-21 DIAGNOSIS — M1711 Unilateral primary osteoarthritis, right knee: Secondary | ICD-10-CM | POA: Diagnosis not present

## 2022-01-21 DIAGNOSIS — Z96651 Presence of right artificial knee joint: Secondary | ICD-10-CM | POA: Diagnosis not present

## 2022-01-21 DIAGNOSIS — M25661 Stiffness of right knee, not elsewhere classified: Secondary | ICD-10-CM | POA: Diagnosis not present

## 2022-01-23 DIAGNOSIS — M6281 Muscle weakness (generalized): Secondary | ICD-10-CM | POA: Diagnosis not present

## 2022-01-23 DIAGNOSIS — R262 Difficulty in walking, not elsewhere classified: Secondary | ICD-10-CM | POA: Diagnosis not present

## 2022-01-23 DIAGNOSIS — M25661 Stiffness of right knee, not elsewhere classified: Secondary | ICD-10-CM | POA: Diagnosis not present

## 2022-01-23 DIAGNOSIS — M1711 Unilateral primary osteoarthritis, right knee: Secondary | ICD-10-CM | POA: Diagnosis not present

## 2022-01-23 DIAGNOSIS — Z96651 Presence of right artificial knee joint: Secondary | ICD-10-CM | POA: Diagnosis not present

## 2022-02-01 ENCOUNTER — Ambulatory Visit (INDEPENDENT_AMBULATORY_CARE_PROVIDER_SITE_OTHER): Payer: Medicare Other | Admitting: Adult Health

## 2022-02-01 ENCOUNTER — Encounter: Payer: Self-pay | Admitting: Adult Health

## 2022-02-01 VITALS — BP 118/70 | HR 93 | Temp 97.9°F | Ht 61.0 in | Wt 167.0 lb

## 2022-02-01 DIAGNOSIS — E1140 Type 2 diabetes mellitus with diabetic neuropathy, unspecified: Secondary | ICD-10-CM

## 2022-02-01 DIAGNOSIS — I10 Essential (primary) hypertension: Secondary | ICD-10-CM | POA: Diagnosis not present

## 2022-02-01 LAB — POCT GLYCOSYLATED HEMOGLOBIN (HGB A1C): Hemoglobin A1C: 6 % — AB (ref 4.0–5.6)

## 2022-02-01 MED ORDER — PEN NEEDLES 31G X 5 MM MISC: 0 refills | Status: DC

## 2022-02-01 MED ORDER — INSULIN GLARGINE-YFGN 100 UNIT/ML ~~LOC~~ SOPN: 10.0000 [IU] | PEN_INJECTOR | Freq: Every day | SUBCUTANEOUS | 0 refills | Status: AC

## 2022-02-01 NOTE — Progress Notes (Signed)
Subjective:    Patient ID: Alexandra [name], female    DOB: 04-07-1961, 61 y.o.   MRN: [id number]  HPI 61 year old female who is being evaluated today for follow-up regarding diabetes and hypertension  Diabetes mellitus type 2-managed with Trulicity 3 mg weekly and glipizide 5 mg XR BID and metformin 1000 mg daily.  She does monitor her blood sugars at home with the [employer] system and is at goal about 60% of the time but is having a lot of hypoglycemic spikes.  Lab Results  Component Value Date   HGBA1C 6.9 (H) 10/31/2021   Wt Readings from Last 3 Encounters:  02/01/22 167 lb (75.8 kg)  01/16/22 167 lb (75.8 kg)  11/13/21 181 lb (82.1 kg)   Hypertension-managed with [place] 10-6.25 mg daily.  She denies dizziness, lightheadedness, chest pain, or shortness of breath BP Readings from Last 3 Encounters:  01/16/22 138/80  11/16/21 (!) 150/83  10/31/21 140/80    Review of Systems See HPI   Past Medical History:  Diagnosis Date   Allergy    Anemia    past hx   Blood transfusion without reported diagnosis    Diabetes mellitus ([place])    Fatty liver    GERD (gastroesophageal reflux disease)    prn med.   Hiatal hernia 07/29/1996   Hyperlipidemia    Hyperplastic colon polyp    Hypertension    new dx. - will start med. 06/28/2011   Hypothyroidism    Neuromuscular disorder (HCC)    hemiplegia , neuropathy   Rotator cuff tear, right    Stroke [facility])    TIA 2016    Social History   Socioeconomic History   Marital status: Married    Spouse name: Not on file   Number of children: Not on file   Years of education: Not on file   Highest education level: Bachelor's degree (e.g., BA, AB, BS)  Occupational History   Occupation: unemployed  Tobacco Use   Smoking status: Former    Packs/day: 1.00    Years: 10.00    Total pack years: 10.00    Types: Cigarettes    Quit date: 07/29/1984    Years since quitting: 37.5   Smokeless tobacco: Never  Vaping Use   Vaping Use: Never  used  Substance and Sexual Activity   Alcohol use: Yes    Comment: occassionally   Drug use: No   Sexual activity: Yes  Other Topics Concern   Not on file  Social History Narrative   Married for 32 years    Six children- all live around here. Two daughters live with her   6 grand children    On Disability for mental issues   No pets   Likes to go to movies and out to dinner. She likes to do puzzle books. Likes to read      She eats a healthy diet   She does not exercise    Lives at home with husband and granddaughter   Caffeine: 4 cups/day   Social Determinants of Health   Financial Resource Strain: Low Risk  (08/16/2021)   Overall Financial Resource Strain (CARDIA)    Difficulty of Paying Living Expenses: Not very hard  Food Insecurity: No Food Insecurity (08/16/2021)   Hunger Vital Sign    Worried About Running Out of Food in the Last Year: Never true    Ran Out of Food in the Last Year: Never true  Transportation Needs: No Transportation Needs (08/16/2021)  PRAPARE - Hydrologist (Medical): No    Lack of Transportation (Non-Medical): No  Physical Activity: Unknown (08/16/2021)   Exercise Vital Sign    Days of Exercise per Week: 0 days    Minutes of Exercise per Session: Not on file  Stress: No Stress Concern Present (08/16/2021)   [place]    Feeling of Stress : Not at all  Social Connections: [place] (08/16/2021)   Social Connection and Isolation Panel [NHANES]    Frequency of Communication with Friends and Family: More than three times a week    Frequency of Social Gatherings with Friends and Family: More than three times a week    Attends Religious Services: More than 4 times per year    Active Member of Clubs or Organizations: Yes    Attends Music therapist: More than 4 times per year    Marital Status: Married  Human resources officer Violence: Not on  file    Past Surgical History:  Procedure Laterality Date   ABDOMINAL HYSTERECTOMY  06/25/2000   left salpingo-oophorectomy   BACK SURGERY  07/30/1995   discetomy, lumbar spinal fusion   CARDIAC CATHETERIZATION  04/01/2006   CESAREAN SECTION  07/29/1989   CHOLECYSTECTOMY  07/30/1987   COLONOSCOPY     EXPLORATORY LAPAROTOMY  05/08/2000   right salpingo-oophorectomy   LAMINOTOMY / EXCISION DISK POSTERIOR CERVICAL SPINE  08/10/2009   C7-T1   MENISCUS REPAIR Right 04/2021   Knee   POLYPECTOMY     HPP 2012   ROUX-EN-Y GASTRIC BYPASS  05/19/2007   SHOULDER ARTHROSCOPY  01/02/2006; 05/31/2004   left 2007; right 2005   SHOULDER ARTHROSCOPY  07/02/2011   Procedure: ARTHROSCOPY SHOULDER;  Surgeon: Cammie Sickle., MD;  Location: [facility];  Service: Orthopedics;  Laterality: Right;  repair supraspinatus, Debride glenohumeral joint, labrum   SHOULDER ARTHROSCOPY DISTAL CLAVICLE EXCISION AND OPEN ROTATOR CUFF REPAIR  07/26/2010   right   THYROID SURGERY  early 2000s   goiter   TOTAL 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/2021   Procedure: TOTAL KNEE ARTHROPLASTY;  Surgeon: Renette Butters, MD;  Location: [facility];  Service: Orthopedics;  Laterality: Right;   TUBAL LIGATION  1993    Family History  Problem Relation Age of Onset   Diabetes Mother    Hyperlipidemia Mother    Hypertension Mother    Thyroid disease Mother    Rheum arthritis Mother    COPD Mother    Cancer Paternal Grandfather        Prostate Cancer   Coronary artery disease Other    Colon cancer Neg Hx    Colon polyps Neg Hx    Esophageal cancer Neg Hx    Rectal cancer Neg Hx    Stomach cancer Neg Hx    Pancreatic cancer Neg Hx    Sleep apnea Neg Hx     Allergies  Allergen Reactions   Shrimp (Diagnostic) Anaphylaxis    Eating shrimp causes swelling of tongue and rash   Ace Inhibitors Cough    Current Outpatient Medications on File Prior to Visit  Medication Sig Dispense Refill   acetaminophen  (TYLENOL) 500 MG tablet Take 2 tablets (1,000 mg total) by mouth every 6 (six) hours as needed for mild pain or moderate pain. 60 tablet 0   B Complex Vitamins (B COMPLEX PO) Take 1 tablet by mouth daily.     BIOTIN PO Take 1  tablet by mouth daily.     bisoprolol-hydrochlorothiazide (ZIAC) 10-6.25 MG tablet Take 1 tablet by mouth daily. 90 tablet 3   Blood Glucose Monitoring Suppl (ONETOUCH VERIO) w/Device KIT 1 Device by Does not apply route daily. Use to check blood glucose 4 times day or prn 1 kit 0   Cholecalciferol (VITAMIN D3 PO) Take 1 tablet by mouth daily.     Continuous Blood Gluc Sensor (FREESTYLE LIBRE 3 SENSOR) MISC 1 Device by Does not apply route every 14 (fourteen) days. Place 1 sensor on the skin every 14 days. Use to check glucose continuously 6 each 3   Continuous Blood Gluc Sensor (FREESTYLE LIBRE 3 SENSOR) MISC 1 Device by Does not apply route every 14 (fourteen) days. Place 1 sensor on the skin every 14 days. Use to check glucose continuously 2 each 0   diclofenac Sodium (VOLTAREN) 1 % GEL 2 g 3 (three) times daily as needed (pain).     dicyclomine (BENTYL) 20 MG tablet Take 1 tablet (20 mg total) by mouth 3 (three) times daily before meals for 5 days. 15 tablet 0   docusate sodium (COLACE) 100 MG capsule Take 100 mg by mouth at bedtime.     Dulaglutide (TRULICITY) 3 DH/7.4BU SOPN Inject 3 mg as directed once a week. 3 mL 3   DULoxetine (CYMBALTA) 30 MG capsule Take 30 mg by mouth every morning.     ferrous sulfate 325 (65 FE) MG EC tablet Take 325 mg by mouth daily with breakfast.     fluticasone (FLONASE) 50 MCG/ACT nasal spray USE 2 SPRAYS IN EACH NOSTRIL DAILY (Patient taking differently: 2 sprays daily as needed for allergies or rhinitis.) 48 g 3   gabapentin (NEURONTIN) 400 MG capsule TAKE 2 CAPSULES THREE TIMES A DAY 540 capsule 3   glipiZIDE (GLUCOTROL XL) 10 MG 24 hr tablet TAKE 1 TABLET BY MOUTH DAILY  WITH BREAKFAST (Patient taking differently: Take 5 mg by mouth  daily with breakfast.) 90 tablet 3   glucose blood (ONETOUCH VERIO) test strip Use to check blood sugar 4times a day or PRn 300 strip 3   HYDROcodone-acetaminophen (NORCO/VICODIN) 5-325 MG tablet Take 1 tablet by mouth 3 (three) times daily.     levothyroxine (SYNTHROID) 112 MCG tablet Take 1 tablet (112 mcg total) by mouth daily. 90 tablet 3   metFORMIN (GLUCOPHAGE) 1000 MG tablet TAKE 1 TABLET BY MOUTH DAILY  WITH BREAKFAST 90 tablet 3   methocarbamol (ROBAXIN-750) 750 MG tablet Take 1 tablet (750 mg total) by mouth every 8 (eight) hours as needed for muscle spasms. 20 tablet 0   ondansetron (ZOFRAN-ODT) 4 MG disintegrating tablet Take 1 tablet (4 mg total) by mouth 2 (two) times daily as needed for nausea or vomiting. 10 tablet 0   ONE TOUCH LANCETS MISC Test blood sugars once daily Dx: E11.9 100 each 3   oxybutynin (DITROPAN-XL) 5 MG 24 hr tablet Take 1 tablet (5 mg total) by mouth at bedtime. 90 tablet 3   oxyCODONE (ROXICODONE) 5 MG immediate release tablet Take 1 tablet (5 mg total) by mouth every 6 (six) hours as needed for severe pain. Do not take more than 6 tablets in a 24 hour period. 28 tablet 0   pantoprazole (PROTONIX) 40 MG tablet Take 1 tablet (40 mg total) by mouth daily. 100 tablet 2   risperiDONE (RISPERDAL) 1 MG tablet Take 1 mg by mouth 2 (two) times daily.     rivaroxaban (XARELTO) 10 MG TABS tablet  Take 1 tablet (10 mg total) by mouth daily. To prevent blood clots after surgery. 30 tablet 0   simvastatin (ZOCOR) 20 MG tablet Take 1 tablet (20 mg total) by mouth at bedtime. 90 tablet 3   traZODone (DESYREL) 100 MG tablet Take 200 mg by mouth at bedtime.     VITAMIN E PO Take 1 capsule by mouth daily.     No current facility-administered medications on file prior to visit.    There were no vitals taken for this visit.      Objective:   Physical Exam Vitals and nursing note reviewed.  Constitutional:      Appearance: Normal appearance.  Cardiovascular:     Rate and  Rhythm: Normal rate and regular rhythm.     Pulses: Normal pulses.     Heart sounds: Normal heart sounds.  Pulmonary:     Effort: Pulmonary effort is normal.     Breath sounds: Normal breath sounds.  Skin:    General: Skin is warm and dry.  Neurological:     General: No focal deficit present.     Mental Status: She is alert and oriented to person, place, and time.  Psychiatric:        Mood and Affect: Mood normal.        Behavior: Behavior normal.        Thought Content: Thought content normal.        Judgment: Judgment normal.       Assessment & Plan:  1. Type 2 diabetes mellitus with diabetic neuropathy, without long-term current use of insulin (HCC)  - POC HgB A1c- 6.0  - She is having a lot of spikes in her blood sugar. I am going to have her d/c glipizide and will start her on Semglee 10 units to hopefully get her blood sugars more consistent throughout the day.  - She was advised she can back down to 5 units if blood sugar falls.  - Follow up in 3 months   2. Essential hypertension - Controlled.  - No change in medications   Dorothyann Peng, NP

## 2022-02-01 NOTE — Patient Instructions (Addendum)
Stop Glipizide   I am going to put you on a long acting insulin called Semglee - start with 10 units daily - if you are going to low then back down to 5 units daily.   Lets follow up in 3 months

## 2022-02-04 DIAGNOSIS — M6281 Muscle weakness (generalized): Secondary | ICD-10-CM | POA: Diagnosis not present

## 2022-02-04 DIAGNOSIS — Z96651 Presence of right artificial knee joint: Secondary | ICD-10-CM | POA: Diagnosis not present

## 2022-02-04 DIAGNOSIS — M25661 Stiffness of right knee, not elsewhere classified: Secondary | ICD-10-CM | POA: Diagnosis not present

## 2022-02-04 DIAGNOSIS — R262 Difficulty in walking, not elsewhere classified: Secondary | ICD-10-CM | POA: Diagnosis not present

## 2022-02-04 DIAGNOSIS — M1711 Unilateral primary osteoarthritis, right knee: Secondary | ICD-10-CM | POA: Diagnosis not present

## 2022-02-15 DIAGNOSIS — G8929 Other chronic pain: Secondary | ICD-10-CM | POA: Diagnosis not present

## 2022-02-15 DIAGNOSIS — M25562 Pain in left knee: Secondary | ICD-10-CM | POA: Diagnosis not present

## 2022-02-15 DIAGNOSIS — Z79899 Other long term (current) drug therapy: Secondary | ICD-10-CM | POA: Diagnosis not present

## 2022-02-15 DIAGNOSIS — M25561 Pain in right knee: Secondary | ICD-10-CM | POA: Diagnosis not present

## 2022-02-15 DIAGNOSIS — G629 Polyneuropathy, unspecified: Secondary | ICD-10-CM | POA: Diagnosis not present

## 2022-03-08 ENCOUNTER — Ambulatory Visit (INDEPENDENT_AMBULATORY_CARE_PROVIDER_SITE_OTHER): Payer: Medicare Other

## 2022-03-08 VITALS — Ht 61.0 in | Wt 167.0 lb

## 2022-03-08 DIAGNOSIS — Z0000 Encounter for general adult medical examination without abnormal findings: Secondary | ICD-10-CM

## 2022-03-08 NOTE — Patient Instructions (Addendum)
Alexandra [name] , Thank you for taking time to come for your Medicare Wellness Visit. I appreciate your ongoing commitment to your health goals. Please review the following plan we discussed and let me know if I can assist you in the future.   These are the goals we discussed:  Goals       No current goals (pt-stated)        This is a list of the screening recommended for you and due dates:  Health Maintenance  Topic Date Due   Flu Shot  02/26/2022   Eye exam for diabetics  03/08/2022*   COVID-19 Vaccine (5 - Pfizer risk series) 03/24/2022*   HIV Screening  03/09/2023*   Complete foot exam   05/17/2022   Hemoglobin A1C  08/04/2022   Mammogram  02/18/2023   Tetanus Vaccine  03/15/2025   Colon Cancer Screening  03/16/2028   Hepatitis C Screening: USPSTF Recommendation to screen - Ages 18-79 yo.  Completed   Zoster (Shingles) Vaccine  Completed   HPV Vaccine  Aged Out  *Topic was postponed. The date shown is not the original due date.     Opioid Pain Medicine Management Opioids are powerful medicines that are used to treat moderate to severe pain. When used for short periods of time, they can help you to: Sleep better. Do better in physical or occupational therapy. Feel better in the first few days after an injury. Recover from surgery. Opioids should be taken with the supervision of a trained health care provider. They should be taken for the shortest period of time possible. This is because opioids can be addictive, and the longer you take opioids, the greater your risk of addiction. This addiction can also be called opioid use disorder. What are the risks? Using opioid pain medicines for longer than 3 days increases your risk of side effects. Side effects include: Constipation. Nausea and vomiting. Breathing difficulties (respiratory depression). Drowsiness. Confusion. Opioid use disorder. Itching. Taking opioid pain medicine for a long period of time can affect your ability to  do daily tasks. It also puts you at risk for: Motor vehicle crashes. Depression. Suicide. Heart attack. Overdose, which can be life-threatening. What is a pain treatment plan? A pain treatment plan is an agreement between you and your health care provider. Pain is unique to each person, and treatments vary depending on your condition. To manage your pain, you and your health care provider need to work together. To help you do this: Discuss the goals of your treatment, including how much pain you might expect to have and how you will manage the pain. Review the risks and benefits of taking opioid medicines. Remember that a good treatment plan uses more than one approach and minimizes the chance of side effects. Be honest about the amount of medicines you take and about any drug or alcohol use. Get pain medicine prescriptions from only one health care provider. Pain can be managed with many types of alternative treatments. Ask your health care provider to refer you to one or more specialists who can help you manage pain through: Physical or occupational therapy. Counseling (cognitive behavioral therapy). Good nutrition. Biofeedback. Massage. Meditation. Non-opioid medicine. Following a gentle exercise program. How to use opioid pain medicine Taking medicine Take your pain medicine exactly as told by your health care provider. Take it only when you need it. If your pain gets less severe, you may take less than your prescribed dose if your health care provider approves. If  you are not having pain, do nottake pain medicine unless your health care provider tells you to take it. If your pain is severe, do nottry to treat it yourself by taking more pills than instructed on your prescription. Contact your health care provider for help. Write down the times when you take your pain medicine. It is easy to become confused while on pain medicine. Writing the time can help you avoid overdose. Take  other over-the-counter or prescription medicines only as told by your health care provider. Keeping yourself and others safe  While you are taking opioid pain medicine: Do not drive, use machinery, or power tools. Do not sign legal documents. Do not drink alcohol. Do not take sleeping pills. Do not supervise children by yourself. Do not do activities that require climbing or being in high places. Do not go to a lake, river, ocean, spa, or swimming pool. Do not share your pain medicine with anyone. Keep pain medicine in a locked cabinet or in a secure area where pets and children cannot reach it. Stopping your use of opioids If you have been taking opioid medicine for more than a few weeks, you may need to slowly decrease (taper) how much you take until you stop completely. Tapering your use of opioids can decrease your risk of symptoms of withdrawal, such as: Pain and cramping in the abdomen. Nausea. Sweating. Sleepiness. Restlessness. Uncontrollable shaking (tremors). Cravings for the medicine. Do not attempt to taper your use of opioids on your own. Talk with your health care provider about how to do this. Your health care provider may prescribe a step-down schedule based on how much medicine you are taking and how long you have been taking it. Getting rid of leftover pills Do not save any leftover pills. Get rid of leftover pills safely by: Taking the medicine to a prescription take-back program. This is usually offered by the county or law enforcement. Bringing them to a pharmacy that has a drug disposal container. Flushing them down the toilet. Check the label or package insert of your medicine to see whether this is safe to do. Throwing them out in the trash. Check the label or package insert of your medicine to see whether this is safe to do. If it is safe to throw it out, remove the medicine from the original container, put it into a sealable bag or container, and mix it with  used coffee grounds, food scraps, dirt, or cat litter before putting it in the trash. Follow these instructions at home: Activity Do exercises as told by your health care provider. Avoid activities that make your pain worse. Return to your normal activities as told by your health care provider. Ask your health care provider what activities are safe for you. General instructions You may need to take these actions to prevent or treat constipation: Drink enough fluid to keep your urine pale yellow. Take over-the-counter or prescription medicines. Eat foods that are high in fiber, such as beans, whole grains, and fresh fruits and vegetables. Limit foods that are high in fat and processed sugars, such as fried or sweet foods. Keep all follow-up visits. This is important. Where to find support If you have been taking opioids for a long time, you may benefit from receiving support for quitting from a local support group or counselor. Ask your health care provider for a referral to these resources in your area. Where to find more information Centers for Disease Control and Prevention (CDC): http://www.wolf.info/ [place] Food  and Drug Administration (FDA): [place] Get help right away if: You may have taken too much of an opioid (overdosed). Common symptoms of an overdose: Your breathing is slower or more shallow than normal. You have a very slow heartbeat (pulse). You have slurred speech. You have nausea and vomiting. Your pupils become very small. You have other potential symptoms: You are very confused. You faint or feel like you will faint. You have cold, clammy skin. You have blue lips or fingernails. You have thoughts of harming yourself or harming others. These symptoms may represent a serious problem that is an emergency. Do not wait to see if the symptoms will go away. Get medical help right away. Call your local emergency services (911 in the [place] Do not drive yourself to the hospital.  If  you ever feel like you may hurt yourself or others, or have thoughts about taking your own life, get help right away. Go to your nearest emergency department or: Call your local emergency services (911 in the [place] Call the [facility] 458-321-7359 in the [place] Call a suicide crisis helpline, such as the [place] at 315-395-3929 or 988 in the [place]. This is open 24 hours a day in the [place] Text the Crisis Text Line at (339)663-5765 (in the [place].). Summary Opioid medicines can help you manage moderate to severe pain for a short period of time. A pain treatment plan is an agreement between you and your health care provider. Discuss the goals of your treatment, including how much pain you might expect to have and how you will manage the pain. If you think that you or someone else may have taken too much of an opioid, get medical help right away. This information is not intended to replace advice given to you by your health care provider. Make sure you discuss any questions you have with your health care provider. Document Revised: 02/07/2021 Document Reviewed: 10/25/2020 Elsevier Patient Education  [place] directives: No  Conditions/risks identified: None  Next appointment: Follow up in one year for your annual wellness visit     Preventive Care 65 Years and Older, Female Preventive care refers to lifestyle choices and visits with your health care provider that can promote health and wellness. What does preventive care include? A yearly physical exam. This is also called an annual well check. Dental exams once or twice a year. Routine eye exams. Ask your health care provider how often you should have your eyes checked. Personal lifestyle choices, including: Daily care of your teeth and gums. Regular physical activity. Eating a healthy diet. Avoiding tobacco and drug use. Limiting alcohol use. Practicing safe sex. Taking  low-dose aspirin every day. Taking vitamin and mineral supplements as recommended by your health care provider. What happens during an annual well check? The services and screenings done by your health care provider during your annual well check will depend on your age, overall health, lifestyle risk factors, and family history of disease. Counseling  Your health care provider may ask you questions about your: Alcohol use. Tobacco use. Drug use. Emotional well-being. Home and relationship well-being. Sexual activity. Eating habits. History of falls. Memory and ability to understand (cognition). Work and work Statistician. Reproductive health. Screening  You may have the following tests or measurements: Height, weight, and BMI. Blood pressure. Lipid and cholesterol levels. These may be checked every 5 years, or more frequently if you are over 41 years old. Skin check. Lung cancer  screening. You may have this screening every year starting at age 27 if you have a 30-pack-year history of smoking and currently smoke or have quit within the past 15 years. Fecal occult blood test (FOBT) of the stool. You may have this test every year starting at age 56. Flexible sigmoidoscopy or colonoscopy. You may have a sigmoidoscopy every 5 years or a colonoscopy every 10 years starting at age 54. Hepatitis C blood test. Hepatitis B blood test. Sexually transmitted disease (STD) testing. Diabetes screening. This is done by checking your blood sugar (glucose) after you have not eaten for a while (fasting). You may have this done every 1-3 years. Bone density scan. This is done to screen for osteoporosis. You may have this done starting at age 18. Mammogram. This may be done every 1-2 years. Talk to your health care provider about how often you should have regular mammograms. Talk with your health care provider about your test results, treatment options, and if necessary, the need for more tests. Vaccines   Your health care provider may recommend certain vaccines, such as: Influenza vaccine. This is recommended every year. Tetanus, diphtheria, and acellular pertussis (Tdap, Td) vaccine. You may need a Td booster every 10 years. Zoster vaccine. You may need this after age 37. Pneumococcal 13-valent conjugate (PCV13) vaccine. One dose is recommended after age 45. Pneumococcal polysaccharide (PPSV23) vaccine. One dose is recommended after age 76. Talk to your health care provider about which screenings and vaccines you need and how often you need them. This information is not intended to replace advice given to you by your health care provider. Make sure you discuss any questions you have with your health care provider. Document Released: 08/11/2015 Document Revised: 04/03/2016 Document Reviewed: 05/16/2015 Elsevier Interactive Patient Education  2017 [place] Prevention in the Home Falls can cause injuries. They can happen to people of all ages. There are many things you can do to make your home safe and to help prevent falls. What can I do on the outside of my home? Regularly fix the edges of walkways and driveways and fix any cracks. Remove anything that might make you trip as you walk through a door, such as a raised step or threshold. Trim any bushes or trees on the path to your home. Use bright outdoor lighting. Clear any walking paths of anything that might make someone trip, such as rocks or tools. Regularly check to see if handrails are loose or broken. Make sure that both sides of any steps have handrails. Any raised decks and porches should have guardrails on the edges. Have any leaves, snow, or ice cleared regularly. Use sand or salt on walking paths during winter. Clean up any spills in your garage right away. This includes oil or grease spills. What can I do in the bathroom? Use night lights. Install grab bars by the toilet and in the tub and shower. Do not use towel  bars as grab bars. Use non-skid mats or decals in the tub or shower. If you need to sit down in the shower, use a plastic, non-slip stool. Keep the floor dry. Clean up any water that spills on the floor as soon as it happens. Remove soap buildup in the tub or shower regularly. Attach bath mats securely with double-sided non-slip rug tape. Do not have throw rugs and other things on the floor that can make you trip. What can I do in the bedroom? Use night lights. Make sure that you  have a light by your bed that is easy to reach. Do not use any sheets or blankets that are too big for your bed. They should not hang down onto the floor. Have a firm chair that has side arms. You can use this for support while you get dressed. Do not have throw rugs and other things on the floor that can make you trip. What can I do in the kitchen? Clean up any spills right away. Avoid walking on wet floors. Keep items that you use a lot in easy-to-reach places. If you need to reach something above you, use a strong step stool that has a grab bar. Keep electrical cords out of the way. Do not use floor polish or wax that makes floors slippery. If you must use wax, use non-skid floor wax. Do not have throw rugs and other things on the floor that can make you trip. What can I do with my stairs? Do not leave any items on the stairs. Make sure that there are handrails on both sides of the stairs and use them. Fix handrails that are broken or loose. Make sure that handrails are as long as the stairways. Check any carpeting to make sure that it is firmly attached to the stairs. Fix any carpet that is loose or worn. Avoid having throw rugs at the top or bottom of the stairs. If you do have throw rugs, attach them to the floor with carpet tape. Make sure that you have a light switch at the top of the stairs and the bottom of the stairs. If you do not have them, ask someone to add them for you. What else can I do to help  prevent falls? Wear shoes that: Do not have high heels. Have rubber bottoms. Are comfortable and fit you well. Are closed at the toe. Do not wear sandals. If you use a stepladder: Make sure that it is fully opened. Do not climb a closed stepladder. Make sure that both sides of the stepladder are locked into place. Ask someone to hold it for you, if possible. Clearly mark and make sure that you can see: Any grab bars or handrails. First and last steps. Where the edge of each step is. Use tools that help you move around (mobility aids) if they are needed. These include: Canes. Walkers. Scooters. Crutches. Turn on the lights when you go into a dark area. Replace any light bulbs as soon as they burn out. Set up your furniture so you have a clear path. Avoid moving your furniture around. If any of your floors are uneven, fix them. If there are any pets around you, be aware of where they are. Review your medicines with your doctor. Some medicines can make you feel dizzy. This can increase your chance of falling. Ask your doctor what other things that you can do to help prevent falls. This information is not intended to replace advice given to you by your health care provider. Make sure you discuss any questions you have with your health care provider. Document Released: 05/11/2009 Document Revised: 12/21/2015 Document Reviewed: 08/19/2014 Elsevier Interactive Patient Education  2017 [employer].

## 2022-03-08 NOTE — Progress Notes (Signed)
Subjective:   Alexandra [name] is a 61 y.o. female who presents for Medicare Annual (Subsequent) preventive examination.  Review of Systems    Virtual Visit via Telephone Note  I connected with  Alexandra [name] on 03/08/22 at  3:30 PM EDT by telephone and verified that I am speaking with the correct person using two identifiers.  Location: Patient: Home Provider: Office Persons participating in the virtual visit: patient/Nurse Health Advisor   I discussed the limitations, risks, security and privacy concerns of performing an evaluation and management service by telephone and the availability of in person appointments. The patient expressed understanding and agreed to proceed.  Interactive audio and video telecommunications were attempted between this nurse and patient, however failed, due to patient having technical difficulties OR patient did not have access to video capability.  We continued and completed visit with audio only.  Some vital signs may be absent or patient reported.   Criselda Peaches, LPN  Cardiac Risk Factors include: advanced age (>3mn, >>49women);hypertension;diabetes mellitus     Objective:    Today's Vitals   03/08/22 1536  Weight: 167 lb (75.8 kg)  Height: _0  (1.549 m)   Body mass index is 31.55 kg/m.     03/08/2022    3:43 PM 11/14/2021   12:25 AM 10/31/2021    2:26 PM 03/16/2021    7:14 AM 06/12/2016    7:52 PM 04/20/2013    5:00 PM 04/20/2013    7:34 AM  Advanced Directives  Does Patient Have a Medical Advance Directive? _1   Patient does not have advance directive;Patient would not like information  Would patient like information on creating a medical advance directive? No - Patient declined No - Patient declined No - Patient declined No - Patient declined No - patient declined information    Pre-existing out of facility DNR order (yellow form or pink MOST form)            Information is confidential and restricted. Go  to Review Flowsheets to unlock data.    Current Medications (verified) Outpatient Encounter Medications as of 03/08/2022  Medication Sig   acetaminophen (TYLENOL) 500 MG tablet Take 2 tablets (1,000 mg total) by mouth every 6 (six) hours as needed for mild pain or moderate pain.   B Complex Vitamins (B COMPLEX PO) Take 1 tablet by mouth daily.   BIOTIN PO Take 1 tablet by mouth daily.   bisoprolol-hydrochlorothiazide (ZIAC) 10-6.25 MG tablet Take 1 tablet by mouth daily.   Blood Glucose Monitoring Suppl (ONETOUCH VERIO) w/Device KIT 1 Device by Does not apply route daily. Use to check blood glucose 4 times day or prn   Cholecalciferol (VITAMIN D3 PO) Take 1 tablet by mouth daily.   Continuous Blood Gluc Sensor (FREESTYLE LIBRE 3 SENSOR) MISC 1 Device by Does not apply route every 14 (fourteen) days. Place 1 sensor on the skin every 14 days. Use to check glucose continuously   Continuous Blood Gluc Sensor (FREESTYLE LIBRE 3 SENSOR) MISC 1 Device by Does not apply route every 14 (fourteen) days. Place 1 sensor on the skin every 14 days. Use to check glucose continuously   diclofenac Sodium (VOLTAREN) 1 % GEL 2 g 3 (three) times daily as needed (pain).   dicyclomine (BENTYL) 20 MG tablet Take 1 tablet (20 mg total) by mouth 3 (three) times daily before meals for 5 days.   docusate sodium (COLACE) 100 MG capsule Take 100 mg by mouth  at bedtime.   Dulaglutide (TRULICITY) 3 ST/4.1DQ SOPN Inject 3 mg as directed once a week.   DULoxetine (CYMBALTA) 30 MG capsule Take 30 mg by mouth every morning.   ferrous sulfate 325 (65 FE) MG EC tablet Take 325 mg by mouth daily with breakfast.   fluticasone (FLONASE) 50 MCG/ACT nasal spray USE 2 SPRAYS IN EACH NOSTRIL DAILY (Patient taking differently: 2 sprays daily as needed for allergies or rhinitis.)   gabapentin (NEURONTIN) 400 MG capsule TAKE 2 CAPSULES THREE TIMES A DAY   glucose blood (ONETOUCH VERIO) test strip Use to check blood sugar 4times a day or PRn    HYDROcodone-acetaminophen (NORCO/VICODIN) 5-325 MG tablet Take 1 tablet by mouth 3 (three) times daily.   insulin glargine-yfgn (SEMGLEE) 100 UNIT/ML Pen Inject 10 Units into the skin daily.   Insulin Pen Needle (PEN NEEDLES) 31G X 5 MM MISC Use with insulin pen   levothyroxine (SYNTHROID) 112 MCG tablet Take 1 tablet (112 mcg total) by mouth daily.   metFORMIN (GLUCOPHAGE) 1000 MG tablet TAKE 1 TABLET BY MOUTH DAILY  WITH BREAKFAST   methocarbamol (ROBAXIN-750) 750 MG tablet Take 1 tablet (750 mg total) by mouth every 8 (eight) hours as needed for muscle spasms.   ondansetron (ZOFRAN-ODT) 4 MG disintegrating tablet Take 1 tablet (4 mg total) by mouth 2 (two) times daily as needed for nausea or vomiting.   ONE TOUCH LANCETS MISC Test blood sugars once daily Dx: E11.9   oxybutynin (DITROPAN-XL) 5 MG 24 hr tablet Take 1 tablet (5 mg total) by mouth at bedtime.   oxyCODONE (ROXICODONE) 5 MG immediate release tablet Take 1 tablet (5 mg total) by mouth every 6 (six) hours as needed for severe pain. Do not take more than 6 tablets in a 24 hour period.   pantoprazole (PROTONIX) 40 MG tablet Take 1 tablet (40 mg total) by mouth daily.   risperiDONE (RISPERDAL) 1 MG tablet Take 1 mg by mouth 2 (two) times daily.   rivaroxaban (XARELTO) 10 MG TABS tablet Take 1 tablet (10 mg total) by mouth daily. To prevent blood clots after surgery.   simvastatin (ZOCOR) 20 MG tablet Take 1 tablet (20 mg total) by mouth at bedtime.   traZODone (DESYREL) 100 MG tablet Take 200 mg by mouth at bedtime.   VITAMIN E PO Take 1 capsule by mouth daily.   No facility-administered encounter medications on file as of 03/08/2022.    Allergies (verified) Shrimp (diagnostic) and Ace inhibitors   History: Past Medical History:  Diagnosis Date   Allergy    Anemia    past hx   Blood transfusion without reported diagnosis    Diabetes mellitus ([place])    Fatty liver    GERD (gastroesophageal reflux disease)    prn med.    Hiatal hernia 07/29/1996   Hyperlipidemia    Hyperplastic colon polyp    Hypertension    new dx. - will start med. 06/28/2011   Hypothyroidism    Neuromuscular disorder ([place])    hemiplegia , neuropathy   Rotator cuff tear, right    Stroke [facility])    TIA 2016   Past Surgical History:  Procedure Laterality Date   ABDOMINAL HYSTERECTOMY  06/25/2000   left salpingo-oophorectomy   BACK SURGERY  07/30/1995   discetomy, lumbar spinal fusion   CARDIAC CATHETERIZATION  04/01/2006   CESAREAN SECTION  07/29/1989   CHOLECYSTECTOMY  07/30/1987   COLONOSCOPY     EXPLORATORY LAPAROTOMY  05/08/2000   right salpingo-oophorectomy  LAMINOTOMY / EXCISION DISK POSTERIOR CERVICAL SPINE  08/10/2009   C7-T1   MENISCUS REPAIR Right 04/2021   Knee   POLYPECTOMY     [facility] 2012   ROUX-EN-Y GASTRIC BYPASS  05/19/2007   SHOULDER ARTHROSCOPY  01/02/2006; 05/31/2004   left 2007; right 2005   SHOULDER ARTHROSCOPY  07/02/2011   Procedure: ARTHROSCOPY SHOULDER;  Surgeon: Cammie Sickle., MD;  Location: [place];  Service: Orthopedics;  Laterality: Right;  repair supraspinatus, Debride glenohumeral joint, labrum   SHOULDER ARTHROSCOPY DISTAL CLAVICLE EXCISION AND OPEN ROTATOR CUFF REPAIR  07/26/2010   right   THYROID SURGERY  early 2000s   goiter   TOTAL 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/2021   Procedure: TOTAL KNEE ARTHROPLASTY;  Surgeon: Renette Butters, MD;  Location: [facility];  Service: Orthopedics;  Laterality: Right;   TUBAL LIGATION  1993   Family History  Problem Relation Age of Onset   Diabetes Mother    Hyperlipidemia Mother    Hypertension Mother    Thyroid disease Mother    Rheum arthritis Mother    COPD Mother    Cancer Paternal Grandfather        Prostate Cancer   Coronary artery disease Other    Colon cancer Neg Hx    Colon polyps Neg Hx    Esophageal cancer Neg Hx    Rectal cancer Neg Hx    Stomach cancer Neg Hx    Pancreatic cancer Neg Hx    Sleep apnea Neg Hx     Social History   Socioeconomic History   Marital status: Married    Spouse name: Not on file   Number of children: Not on file   Years of education: Not on file   Highest education level: Bachelor's degree (e.g., BA, AB, BS)  Occupational History   Occupation: unemployed  Tobacco Use   Smoking status: Former    Packs/day: 1.00    Years: 10.00    Total pack years: 10.00    Types: Cigarettes    Quit date: 07/29/1984    Years since quitting: 37.6   Smokeless tobacco: Never  Vaping Use   Vaping Use: Never used  Substance and Sexual Activity   Alcohol use: Yes    Comment: occassionally   Drug use: No   Sexual activity: Yes  Other Topics Concern   Not on file  Social History Narrative   Married for 32 years    Six children- all live around here. Two daughters live with her   25 grand children    On Disability for mental issues   No pets   Likes to go to movies and out to dinner. She likes to do puzzle books. Likes to read      She eats a healthy diet   She does not exercise    Lives at home with husband and granddaughter   Caffeine: 4 cups/day   Social Determinants of Health   Financial Resource Strain: Low Risk  (03/08/2022)   Overall Financial Resource Strain (CARDIA)    Difficulty of Paying Living Expenses: Not hard at all  Food Insecurity: No Food Insecurity (03/08/2022)   Hunger Vital Sign    Worried About Running Out of Food in the Last Year: Never true    Ran Out of Food in the Last Year: Never true  Transportation Needs: No Transportation Needs (03/08/2022)   PRAPARE - Hydrologist (Medical): No    Lack of Transportation (  Non-Medical): No  Physical Activity: Inactive (03/08/2022)   Exercise Vital Sign    Days of Exercise per Week: 0 days    Minutes of Exercise per Session: 0 min  Stress: No Stress Concern Present (03/08/2022)   [place]    Feeling of Stress : Not  at all  Social Connections: [place] (03/08/2022)   Social Connection and Isolation Panel [NHANES]    Frequency of Communication with Friends and Family: More than three times a week    Frequency of Social Gatherings with Friends and Family: More than three times a week    Attends Religious Services: More than 4 times per year    Active Member of [employer] or Organizations: Yes    Attends Music therapist: More than 4 times per year    Marital Status: Married    Tobacco Counseling Counseling given: Not Answered   Clinical Intake:  Pre-visit preparation completed: NoNutrition Risk Assessment:  Has the patient had any N/V/D within the last 2 months?  No  Does the patient have any non-healing wounds?  No  Has the patient had any unintentional weight loss or weight gain?  No   Diabetes:  Is the patient diabetic?  Yes  If diabetic, was a CBG obtained today?  No  Did the patient bring in their glucometer from home?  No  How often do you monitor your CBG's? Daily.   Financial Strains and Diabetes Management:  Are you having any financial strains with the device, your supplies or your medication? No .  Does the patient want to be seen by Chronic Care Management for management of their diabetes?  No  Would the patient like to be referred to a Nutritionist or for Diabetic Management?  No   Diabetic Exams:  Diabetic Eye Exam: Completed No. Overdue for diabetic eye exam. Pt has been advised about the importance in completing this exam. A referral has been placed today. Message sent to referral coordinator for scheduling purposes. Advised pt to expect a call from office referred to regarding appt.  Diabetic Foot Exam: Completed No. Pt has been advised about the importance in completing this exam. Pt is scheduled for diabetic foot exam on Followed by PCP.    Pain : No/denies painDiabetic?  Yes  Interpreter Needed?: No  Activities of Daily Living    03/08/2022     3:42 PM 11/14/2021   12:25 AM  In your present state of health, do you have any difficulty performing the following activities:  Hearing? 0 0  Vision? 0 0  Difficulty concentrating or making decisions? 0 0  Walking or climbing stairs? 0 0  Dressing or bathing? 0 0  Doing errands, shopping? 0 0  Preparing Food and eating ? N   Using the Toilet? N   In the past six months, have you accidently leaked urine? N   Do you have problems with loss of bowel control? N   Managing your Medications? N   Managing your Finances? N   Housekeeping or managing your Housekeeping? N     Patient Care Team: Dorothyann Peng, NP as PCP - General (Family Medicine)  Indicate any recent Medical Services you may have received from other than [facility] providers in the past year (date may be approximate).     Assessment:   This is a routine wellness examination for Alexandra [name].  Hearing/Vision screen Hearing Screening - Comments:: No hearing difficulty Vision Screening - Comments:: Wears  glasses. Followed by Dr Sabra Heck  Dietary issues and exercise activities discussed: Exercise limited by: None identified   Goals Addressed               This Visit's Progress     No current goals (pt-stated)         Depression Screen    03/08/2022    3:40 PM 08/02/2021   10:52 AM 05/17/2021    8:56 AM 04/15/2019   11:11 AM 04/10/2018    8:48 AM 03/25/2017    8:30 AM 03/19/2016    4:05 PM  PHQ 2/9 Scores  PHQ - 2 Score 0 0 0 3 0 0 0  PHQ- 9 Score    9       Fall Risk    03/08/2022    3:43 PM 08/02/2021   11:29 AM 05/28/2019    8:57 AM 05/13/2019    9:06 AM 04/15/2019   11:10 AM  Fall Risk   Falls in the past year? 0 1 0 0 1  Number falls in past yr: 0 1   0  Injury with Fall? 0 0   0  Risk for fall due to : No Fall Risks    History of fall(s)    FALL RISK PREVENTION PERTAINING TO THE HOME:  Any stairs in or around the home? Yes  If so, are there any without handrails? No  Home free of loose throw rugs in  walkways, pet beds, electrical cords, etc? Yes  Adequate lighting in your home to reduce risk of falls? Yes   ASSISTIVE DEVICES UTILIZED TO PREVENT FALLS:  Life alert? No  Use of a cane, walker or w/c? No  Grab bars in the bathroom? No  Shower chair or bench in shower? No  Elevated toilet seat or a handicapped toilet? No   TIMED UP AND GO:  Was the test performed? No . Audio Visit  Cognitive Function:        03/08/2022    3:44 PM  6CIT Screen  What Year? 0 points  What month? 0 points  What time? 0 points  Count back from 20 0 points  Months in reverse 0 points  Repeat phrase 0 points  Total Score 0 points    Immunizations Immunization History  Administered Date(s) Administered   Influenza Split 04/24/2012   Influenza,inj,Quad PF,6+ Mos 03/30/2013, 06/20/2014, 04/17/2015, 04/08/2016, 03/25/2017, 04/10/2018, 10/06/2019, 05/04/2020, 05/17/2021   PFIZER(Purple Top)SARS-COV-2 Vaccination 10/24/2019, 11/14/2019, 05/28/2020, 06/09/2021   Pneumococcal Conjugate-13 03/16/2015, 03/25/2017   Pneumococcal Polysaccharide-23 04/10/2018   Td 08/29/1998, 03/29/2005   Tdap 03/16/2015   Zoster Recombinat (Shingrix) 02/15/2021, 05/17/2021    TDAP status: Up to date  Flu Vaccine status: Up to date    Covid-19 vaccine status: Completed vaccines  Qualifies for Shingles Vaccine? Yes   Zostavax completed Yes   Shingrix Completed?: Yes  Screening Tests Health Maintenance  Topic Date Due   INFLUENZA VACCINE  02/26/2022   OPHTHALMOLOGY EXAM  03/08/2022 (Originally 01/23/2022)   COVID-19 Vaccine (5 - Pfizer risk series) 03/24/2022 (Originally 08/04/2021)   HIV Screening  03/09/2023 (Originally 03/20/1976)   FOOT EXAM  05/17/2022   HEMOGLOBIN A1C  08/04/2022   MAMMOGRAM  02/18/2023   TETANUS/TDAP  03/15/2025   COLONOSCOPY (Pts 45-70yrs Insurance coverage will need to be confirmed)  03/16/2028   Hepatitis C Screening  Completed   Zoster Vaccines- Shingrix  Completed   HPV VACCINES   Aged Out    Health Maintenance  Health Maintenance Due  Topic Date Due   INFLUENZA VACCINE  02/26/2022    Colorectal cancer screening: Type of screening: Colonoscopy. Completed 03/16/21. Repeat every 7 years  Mammogram status: Completed 02/17/21. Repeat every year    Lung Cancer Screening: (Low Dose CT Chest recommended if Age 29-80 years, 30 pack-year currently smoking OR have quit w/in 15years.) does not qualify.     Additional Screening:  Hepatitis C Screening: does qualify; Completed 03/19/16  Vision Screening: Recommended annual ophthalmology exams for early detection of glaucoma and other disorders of the eye. Is the patient up to date with their annual eye exam?  Yes  Who is the provider or what is the name of the office in which the patient attends annual eye exams? Dr Sabra Heck If pt is not established with a provider, would they like to be referred to a provider to establish care? No .   Dental Screening: Recommended annual dental exams for proper oral hygiene  Community Resource Referral / Chronic Care Management:  CRR required this visit?  No   CCM required this visit?  No      Plan:     I have personally reviewed and noted the following in the patient's chart:   Medical and social history Use of alcohol, tobacco or illicit drugs  Current medications and supplements including opioid prescriptions.  Functional ability and status Nutritional status Physical activity Advanced directives List of other physicians Hospitalizations, surgeries, and ER visits in previous 12 months Vitals Screenings to include cognitive, depression, and falls Referrals and appointments  In addition, I have reviewed and discussed with patient certain preventive protocols, quality metrics, and best practice recommendations. A written personalized care plan for preventive services as well as general preventive health recommendations were provided to patient.     Criselda Peaches, LPN   07/30/5850   Nurse Notes: Patient due lab HIV screening

## 2022-03-12 ENCOUNTER — Other Ambulatory Visit: Payer: Self-pay | Admitting: *Deleted

## 2022-03-12 NOTE — Patient Outreach (Signed)
  Care Coordination   03/12/2022 Name: Alexandra [name] MRN: [id number] DOB: 08/16/1960   Care Coordination Outreach Attempts:  An unsuccessful telephone outreach was attempted today to offer the patient information about available care coordination services as a benefit of their health plan.   Follow Up Plan:  Additional outreach attempts will be made to offer the patient care coordination information and services.   Encounter Outcome:  No Answer  Care Coordination Interventions Activated:  No   Care Coordination Interventions:  No, not indicated    Raina Mina, RN Care Management Coordinator [place] Office [phone]

## 2022-03-17 ENCOUNTER — Other Ambulatory Visit: Payer: Self-pay | Admitting: Adult Health

## 2022-03-19 DIAGNOSIS — M25562 Pain in left knee: Secondary | ICD-10-CM | POA: Diagnosis not present

## 2022-03-19 DIAGNOSIS — M25561 Pain in right knee: Secondary | ICD-10-CM | POA: Diagnosis not present

## 2022-03-19 DIAGNOSIS — Z79899 Other long term (current) drug therapy: Secondary | ICD-10-CM | POA: Diagnosis not present

## 2022-03-19 DIAGNOSIS — G8929 Other chronic pain: Secondary | ICD-10-CM | POA: Diagnosis not present

## 2022-03-19 DIAGNOSIS — G629 Polyneuropathy, unspecified: Secondary | ICD-10-CM | POA: Diagnosis not present

## 2022-04-10 ENCOUNTER — Encounter: Payer: Self-pay | Admitting: Adult Health

## 2022-04-16 DIAGNOSIS — G629 Polyneuropathy, unspecified: Secondary | ICD-10-CM | POA: Diagnosis not present

## 2022-04-16 DIAGNOSIS — M25561 Pain in right knee: Secondary | ICD-10-CM | POA: Diagnosis not present

## 2022-04-16 DIAGNOSIS — Z79899 Other long term (current) drug therapy: Secondary | ICD-10-CM | POA: Diagnosis not present

## 2022-04-16 DIAGNOSIS — M25562 Pain in left knee: Secondary | ICD-10-CM | POA: Diagnosis not present

## 2022-04-16 DIAGNOSIS — G8929 Other chronic pain: Secondary | ICD-10-CM | POA: Diagnosis not present

## 2022-04-17 MED ORDER — LANCET DEVICES MISC: 2 refills | Status: AC

## 2022-04-17 NOTE — Telephone Encounter (Signed)
Spoke to pt and advised that no one has answered the phone. Pt stated that she received an email last night stating that nothing was received. Will try again.

## 2022-04-18 NOTE — Telephone Encounter (Signed)
Called number and lm for a return call.

## 2022-04-22 ENCOUNTER — Telehealth: Payer: Self-pay | Admitting: Adult Health

## 2022-04-22 NOTE — Telephone Encounter (Signed)
Almyra Free with [employer] called to confirm if we received the fax requesting authorization for the [facility] 3.  Call back [phone] Fax # [phone]

## 2022-04-23 ENCOUNTER — Other Ambulatory Visit: Payer: BC Managed Care – PPO

## 2022-04-23 NOTE — Telephone Encounter (Signed)
Pt called to inform NP/CMA that [facility] has the chart notes, but they still need the Rx for the [employer] 3.  Please call: [phone]

## 2022-04-24 ENCOUNTER — Other Ambulatory Visit: Payer: Self-pay | Admitting: Adult Health

## 2022-04-24 DIAGNOSIS — E1140 Type 2 diabetes mellitus with diabetic neuropathy, unspecified: Secondary | ICD-10-CM

## 2022-04-24 MED ORDER — FREESTYLE LIBRE 3 SENSOR MISC: 1.0000 | 3 refills | Status: DC

## 2022-04-24 NOTE — Telephone Encounter (Signed)
[employer], was able to get fax # [phone]). Faxed Rx, confirmation rec'd.

## 2022-04-25 NOTE — Telephone Encounter (Signed)
Spoke to pt and advised of [employer] effort to help in this matter. Pt verbalized understanding and will call back if anything else is needed.

## 2022-04-29 ENCOUNTER — Telehealth: Payer: Self-pay | Admitting: Adult Health

## 2022-04-29 ENCOUNTER — Other Ambulatory Visit: Payer: Self-pay | Admitting: Adult Health

## 2022-04-29 NOTE — Telephone Encounter (Signed)
[facility], patient states she has been out for a week

## 2022-04-30 ENCOUNTER — Other Ambulatory Visit: Payer: Self-pay | Admitting: *Deleted

## 2022-04-30 DIAGNOSIS — E1140 Type 2 diabetes mellitus with diabetic neuropathy, unspecified: Secondary | ICD-10-CM

## 2022-04-30 MED ORDER — FREESTYLE LIBRE 3 SENSOR MISC: 1.0000 | 3 refills | Status: DC

## 2022-05-01 NOTE — Telephone Encounter (Signed)
Prescription was filled on 04/30/22

## 2022-05-02 ENCOUNTER — Telehealth: Payer: Self-pay | Admitting: *Deleted

## 2022-05-02 NOTE — Patient Outreach (Signed)
  Care Coordination   05/02/2022 Name: Alexandra [name] MRN: [id number] DOB: [birth year]   Care Coordination Outreach Attempts:  A second unsuccessful outreach was attempted today to offer the patient with information about available care coordination services as a benefit of their health plan.     Follow Up Plan:  Additional outreach attempts will be made to offer the patient care coordination information and services.   Encounter Outcome:  No Answer  Care Coordination Interventions Activated:  No   Care Coordination Interventions:  No, not indicated    Raina Mina, RN Care Management Coordinator [place] Office [phone]

## 2022-05-03 ENCOUNTER — Ambulatory Visit: Payer: Medicare Other | Admitting: Adult Health

## 2022-05-03 ENCOUNTER — Ambulatory Visit (INDEPENDENT_AMBULATORY_CARE_PROVIDER_SITE_OTHER): Payer: Medicare Other | Admitting: Adult Health

## 2022-05-03 VITALS — BP 136/78 | HR 87 | Temp 97.7°F | Wt 167.0 lb

## 2022-05-03 DIAGNOSIS — E1140 Type 2 diabetes mellitus with diabetic neuropathy, unspecified: Secondary | ICD-10-CM | POA: Diagnosis not present

## 2022-05-03 DIAGNOSIS — Z794 Long term (current) use of insulin: Secondary | ICD-10-CM | POA: Diagnosis not present

## 2022-05-03 DIAGNOSIS — E119 Type 2 diabetes mellitus without complications: Secondary | ICD-10-CM | POA: Diagnosis not present

## 2022-05-03 DIAGNOSIS — I10 Essential (primary) hypertension: Secondary | ICD-10-CM

## 2022-05-03 DIAGNOSIS — Z23 Encounter for immunization: Secondary | ICD-10-CM | POA: Diagnosis not present

## 2022-05-03 LAB — POCT GLYCOSYLATED HEMOGLOBIN (HGB A1C): Hemoglobin A1C: 7.3 % — AB (ref 4.0–5.6)

## 2022-05-03 MED ORDER — LANTUS SOLOSTAR 100 UNIT/ML ~~LOC~~ SOPN: 20.0000 [IU] | PEN_INJECTOR | Freq: Every day | SUBCUTANEOUS | 0 refills | Status: DC

## 2022-05-03 NOTE — Progress Notes (Signed)
Subjective:    Patient ID: Alexandra [name], female    DOB: 12/05/1960, 61 y.o.   MRN: [id number]  HPI 61 year old female who  has a past medical history of Allergy, Anemia, Blood transfusion without reported diagnosis, Diabetes mellitus ([place]), Fatty liver, GERD (gastroesophageal reflux disease), Hiatal hernia (07/29/1996), Hyperlipidemia, Hyperplastic colon polyp, Hypertension, Hypothyroidism, Neuromuscular disorder ([place]), Rotator cuff tear, right, and Stroke ([place]).  Alexandra [name] presents to the office today for 98-monthfollow-up regarding diabetes and hypertension  DM Type 2 -currently managed with Trulicity 3 mg weekly, metformin 1000 mg daily and Semglee 10 units daily.  During her last visit her lElenor Legatoshowed that Alexandra [name] was in goal about 60% of the time and was having multiple hypoglycemic episodes. Alexandra [name] has not been able to get a sensor for [employer].   At this time glipizide was stopped and Alexandra [name] was started on Lantus 10 units. Alexandra [name] reports today that Alexandra [name] continues to have some spikes upwards to 300 but is not having any hypoglycemic events.  Lab Results  Component Value Date   HGBA1C 7.3 (A) 05/03/2022   Hypertension-managed with Ziac 10-6.25 mg daily.  Alexandra [name] denies dizziness, lightheadedness, chest pain, or shortness of breath BP Readings from Last 3 Encounters:  05/03/22 136/78  02/01/22 118/70  01/16/22 138/80    Review of Systems See HPI   Past Medical History:  Diagnosis Date   Allergy    Anemia    past hx   Blood transfusion without reported diagnosis    Diabetes [place] ([place]    Fatty liver    GERD (gastroesophageal reflux disease)    prn med.   Hiatal hernia 07/29/1996   Hyperlipidemia    Hyperplastic colon polyp    Hypertension    new dx. - will start med. 06/28/2011   Hypothyroidism    Neuromuscular disorder (HCC)    hemiplegia , neuropathy   Rotator cuff tear, right    Stroke ([facility] [place]    TIA 2016    Social History   Socioeconomic History   Marital  status: Married    Spouse name: Not on file   Number of children: Not on file   Years of education: Not on file   Highest education level: Bachelor's degree (e.g., BA, AB, BS)  Occupational History   Occupation: unemployed  Tobacco Use   Smoking status: Former    Packs/day: 1.00    Years: 10.00    Total pack years: 10.00    Types: Cigarettes    Quit date: 07/29/1984    Years since quitting: 37.7   Smokeless tobacco: Never  Vaping Use   Vaping Use: Never used  Substance and Sexual Activity   Alcohol use: Yes    Comment: occassionally   Drug use: No   Sexual activity: Yes  Other Topics Concern   Not on file  Social History Narrative   Married for 32 years    Six children- all live around here. Two daughters live with her   140grand children    On Disability for mental issues   No pets   Likes to go to movies and out to dinner. Alexandra [name] likes to do puzzle books. Likes to read      Alexandra [name] eats a healthy diet   Alexandra [name] does not exercise    Lives at home with husband and granddaughter   Caffeine: 4 cups/day   Social Determinants of Health   Financial Resource Strain: Low Risk  (03/08/2022)   Overall  Financial Resource Strain (CARDIA)    Difficulty of Paying Living Expenses: Not hard at all  Food Insecurity: No Food Insecurity (03/08/2022)   Hunger Vital Sign    Worried About Running Out of Food in the Last Year: Never true    Ran Out of Food in the Last Year: Never true  Transportation Needs: No Transportation Needs (03/08/2022)   PRAPARE - Hydrologist (Medical): No    Lack of Transportation (Non-Medical): No  Physical Activity: Inactive (03/08/2022)   Exercise Vital Sign    Days of Exercise per Week: 0 days    Minutes of Exercise per Session: 0 min  Stress: No Stress Concern Present (03/08/2022)   [place]    Feeling of Stress : Not at all  Social Connections: [place]  (03/08/2022)   Social Connection and Isolation Panel [NHANES]    Frequency of Communication with Friends and Family: More than three times a week    Frequency of Social Gatherings with Friends and Family: More than three times a week    Attends Religious Services: More than 4 times per year    Active Member of Clubs or Organizations: Yes    Attends Archivist Meetings: More than 4 times per year    Marital Status: Married  Human resources officer Violence: Not At Risk (03/08/2022)   Humiliation, Afraid, Rape, and Kick questionnaire    Fear of Current or Ex-Partner: No    Emotionally Abused: No    Physically Abused: No    Sexually Abused: No    Past Surgical History:  Procedure Laterality Date   ABDOMINAL HYSTERECTOMY  06/25/2000   left salpingo-oophorectomy   BACK SURGERY  07/30/1995   discetomy, lumbar spinal fusion   CARDIAC CATHETERIZATION  04/01/2006   CESAREAN SECTION  07/29/1989   CHOLECYSTECTOMY  07/30/1987   COLONOSCOPY     EXPLORATORY LAPAROTOMY  05/08/2000   right salpingo-oophorectomy   LAMINOTOMY / EXCISION DISK POSTERIOR CERVICAL SPINE  08/10/2009   C7-T1   MENISCUS REPAIR Right 04/2021   Knee   POLYPECTOMY     [facility] 2012   ROUX-EN-Y GASTRIC BYPASS  05/19/2007   SHOULDER ARTHROSCOPY  01/02/2006; 05/31/2004   left 2007; right 2005   SHOULDER ARTHROSCOPY  07/02/2011   Procedure: ARTHROSCOPY SHOULDER;  Surgeon: Cammie Sickle., MD;  Location: [facility];  Service: Orthopedics;  Laterality: Right;  repair supraspinatus, Debride glenohumeral joint, labrum   SHOULDER ARTHROSCOPY DISTAL CLAVICLE EXCISION AND OPEN ROTATOR CUFF REPAIR  07/26/2010   right   THYROID SURGERY  early 2000s   goiter   TOTAL 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/2021   Procedure: TOTAL KNEE ARTHROPLASTY;  Surgeon: Renette Butters, MD;  Location: [facility];  Service: Orthopedics;  Laterality: Right;   TUBAL LIGATION  1993    Family History  Problem Relation Age of Onset   Diabetes  Mother    Hyperlipidemia Mother    Hypertension Mother    Thyroid disease Mother    Rheum arthritis Mother    COPD Mother    Cancer Paternal Grandfather        Prostate Cancer   Coronary artery disease Other    Colon cancer Neg Hx    Colon polyps Neg Hx    Esophageal cancer Neg Hx    Rectal cancer Neg Hx    Stomach cancer Neg Hx    Pancreatic cancer Neg Hx    Sleep  apnea Neg Hx     Allergies  Allergen Reactions   Shrimp (Diagnostic) Anaphylaxis    Eating shrimp causes swelling of tongue and rash   Ace Inhibitors Cough    Current Outpatient Medications on File Prior to Visit  Medication Sig Dispense Refill   acetaminophen (TYLENOL) 500 MG tablet Take 2 tablets (1,000 mg total) by mouth every 6 (six) hours as needed for mild pain or moderate pain. 60 tablet 0   B Complex Vitamins (B COMPLEX PO) Take 1 tablet by mouth daily.     BIOTIN PO Take 1 tablet by mouth daily.     bisoprolol-hydrochlorothiazide (ZIAC) 10-6.25 MG tablet Take 1 tablet by mouth daily. 90 tablet 3   Blood Glucose Monitoring Suppl (ONETOUCH VERIO) w/Device KIT 1 Device by Does not apply route daily. Use to check blood glucose 4 times day or prn 1 kit 0   Cholecalciferol (VITAMIN D3 PO) Take 1 tablet by mouth daily.     Continuous Blood Gluc Sensor (FREESTYLE LIBRE 3 SENSOR) MISC 1 Device by Does not apply route every 14 (fourteen) days. Place 1 sensor on the skin every 14 days. Use to check glucose continuously 6 each 3   diclofenac Sodium (VOLTAREN) 1 % GEL 2 g 3 (three) times daily as needed (pain).     DULoxetine (CYMBALTA) 30 MG capsule Take 30 mg by mouth every morning.     ferrous sulfate 325 (65 FE) MG EC tablet Take 325 mg by mouth daily with breakfast.     fluticasone (FLONASE) 50 MCG/ACT nasal spray USE 2 SPRAYS IN EACH NOSTRIL DAILY (Patient taking differently: 2 sprays daily as needed for allergies or rhinitis.) 48 g 3   gabapentin (NEURONTIN) 400 MG capsule TAKE 2 CAPSULES THREE TIMES A DAY 540  capsule 3   glucose blood (ONETOUCH VERIO) test strip Use to check blood sugar 4times a day or PRn 300 strip 3   HYDROcodone-acetaminophen (NORCO/VICODIN) 5-325 MG tablet Take 1 tablet by mouth 3 (three) times daily.     Insulin Pen Needle (B-D UF III MINI PEN NEEDLES) 31G X 5 MM MISC USE WITH INSULIN PEN AS DIRECTED 100 each 3   Lancet Devices MISC Use to check blood sugar TID 100 each 2   levothyroxine (SYNTHROID) 112 MCG tablet Take 1 tablet (112 mcg total) by mouth daily. 90 tablet 3   meloxicam (MOBIC) 7.5 MG tablet Take 7.5 mg by mouth daily.     metFORMIN (GLUCOPHAGE) 1000 MG tablet TAKE 1 TABLET BY MOUTH DAILY  WITH BREAKFAST 90 tablet 3   ondansetron (ZOFRAN-ODT) 4 MG disintegrating tablet Take 1 tablet (4 mg total) by mouth 2 (two) times daily as needed for nausea or vomiting. 10 tablet 0   oxybutynin (DITROPAN-XL) 5 MG 24 hr tablet Take 1 tablet (5 mg total) by mouth at bedtime. 90 tablet 3   pantoprazole (PROTONIX) 40 MG tablet Take 1 tablet (40 mg total) by mouth daily. 100 tablet 2   risperiDONE (RISPERDAL) 1 MG tablet Take 1 mg by mouth 2 (two) times daily.     rivaroxaban (XARELTO) 10 MG TABS tablet Take 1 tablet (10 mg total) by mouth daily. To prevent blood clots after surgery. 30 tablet 0   simvastatin (ZOCOR) 20 MG tablet Take 1 tablet (20 mg total) by mouth at bedtime. 90 tablet 3   traZODone (DESYREL) 100 MG tablet Take 200 mg by mouth at bedtime.     TRULICITY 3 IH/0.3UU SOPN INJECT THE CONTENTS OF  ONE PEN  SUBCUTANEOUSLY WEEKLY AS  DIRECTED 6 mL 3   VITAMIN E PO Take 1 capsule by mouth daily.     dicyclomine (BENTYL) 20 MG tablet Take 1 tablet (20 mg total) by mouth 3 (three) times daily before meals for 5 days. 15 tablet 0   ONE TOUCH LANCETS MISC Test blood sugars once daily Dx: E11.9 100 each 3   No current facility-administered medications on file prior to visit.    BP 136/78 (BP Location: Left Arm, Patient Position: Sitting, Cuff Size: Normal)   Pulse 87   Temp  97.7 F (36.5 C) (Oral)   Wt 167 lb (75.8 kg)   SpO2 97%   BMI 31.55 kg/m       Objective:   Physical Exam Vitals and nursing note reviewed.  Constitutional:      Appearance: Normal appearance.  Cardiovascular:     Rate and Rhythm: Normal rate and regular rhythm.     Pulses: Normal pulses.     Heart sounds: Normal heart sounds.  Pulmonary:     Effort: Pulmonary effort is normal.     Breath sounds: Normal breath sounds.  Skin:    General: Skin is warm and dry.     Capillary Refill: Capillary refill takes less than 2 seconds.  Neurological:     General: No focal deficit present.     Mental Status: Alexandra [name] is alert and oriented to person, place, and time.  Psychiatric:        Mood and Affect: Mood normal.        Behavior: Behavior normal.        Thought Content: Thought content normal.       Assessment & Plan:  1. Type 2 diabetes mellitus with diabetic neuropathy, without long-term current use of insulin (HCC)  - POCT glycosylated hemoglobin (Hb A1C)- 7.3 - increased and not at goal.  - I am going increase your insulin to 20 units daily. If you notice you are having low blood sugars then either drop to 15 units daily or try doing 10 units twice daily  - insulin glargine (LANTUS SOLOSTAR) 100 UNIT/ML Solostar Pen; Inject 20 Units into the skin daily.  Dispense: 20 mL; Refill: 0 - Follow up in three months   2. Essential hypertension - Controlled. No change in medications   3. Flu vaccine need  - Flu Vaccine QUAD 6+ mos PF IM (Fluarix Quad PF)  Dorothyann Peng, NP

## 2022-05-03 NOTE — Patient Instructions (Signed)
It was great seeing you today. Your A1c went up slightly to 7.3   I am going increase your insulin to 20 units daily. If you notice you are having low blood sugars then either drop to 15 units daily or try doing 10 units twice daily   Follow up in three months

## 2022-05-08 ENCOUNTER — Encounter: Payer: Self-pay | Admitting: *Deleted

## 2022-05-08 ENCOUNTER — Telehealth: Payer: Self-pay | Admitting: *Deleted

## 2022-05-08 NOTE — Patient Instructions (Signed)
Visit Information  Thank you for taking time to visit with me today. Please don't hesitate to contact me if I can be of assistance to you.   Following are the goals we discussed today:   Goals Addressed               This Visit's Progress     COMPLETED: No needs (pt-stated)        Care Coordination Interventions: Reviewed medications with patient and discussed adherence with no needed refills. Reviewed scheduled/upcoming provider appointments including pending appointments and pt has completed her AWV for this year Screening for signs and symptoms of depression related to chronic disease state  Assessed social determinant of health barriers           Please call the care guide team at [phone] if you need to cancel or reschedule your appointment.   If you are experiencing a Mental Health or [place] or need someone to talk to, please call the Suicide and Crisis Lifeline: 988 call the [place] National Suicide Prevention Lifeline: (787)099-6195 or TTY: (380)784-8138 TTY 251-785-2905) to talk to a trained counselor go to [facility] [address] [place] [phone])  Patient verbalizes understanding of instructions and care plan provided today and agrees to view in [place]. Active MyChart status and patient understanding of how to access instructions and care plan via MyChart confirmed with patient.     No further follow up required: No needs presented at this time  Raina Mina, RN Care Management Coordinator [place] Office [phone]

## 2022-05-08 NOTE — Patient Outreach (Signed)
  Care Coordination   Initial Visit Note   05/08/2022 Name: Alexandra [name] MRN: [id number] DOB: 11-02-1960  Alexandra [name] is a 61 y.o. year old female who sees Nafziger, Tommi Rumps, NP for primary care. I spoke with  Alexandra [name] by phone today.  What matters to the patients health and wellness today?  No needs    Goals Addressed               This Visit's Progress     COMPLETED: No needs (pt-stated)        Care Coordination Interventions: Reviewed medications with patient and discussed adherence with no needed refills. Reviewed scheduled/upcoming provider appointments including pending appointments and pt has completed her AWV for this year Screening for signs and symptoms of depression related to chronic disease state  Assessed social determinant of health barriers          SDOH assessments and interventions completed:  Yes  SDOH Interventions Today    Flowsheet Row Most Recent Value  SDOH Interventions   Food Insecurity Interventions Intervention Not Indicated  Housing Interventions Intervention Not Indicated  Transportation Interventions Intervention Not Indicated        Care Coordination Interventions Activated:  Yes  Care Coordination Interventions:  Yes, provided   Follow up plan: No further intervention required.   Encounter Outcome:  Pt. Visit Completed   Alexandra Mina, RN Care Management Coordinator [place] Office [phone]

## 2022-05-17 DIAGNOSIS — G8929 Other chronic pain: Secondary | ICD-10-CM | POA: Diagnosis not present

## 2022-05-17 DIAGNOSIS — M25561 Pain in right knee: Secondary | ICD-10-CM | POA: Diagnosis not present

## 2022-05-17 DIAGNOSIS — Z96651 Presence of right artificial knee joint: Secondary | ICD-10-CM | POA: Diagnosis not present

## 2022-05-17 DIAGNOSIS — G894 Chronic pain syndrome: Secondary | ICD-10-CM | POA: Diagnosis not present

## 2022-05-22 DIAGNOSIS — M545 Low back pain, unspecified: Secondary | ICD-10-CM | POA: Diagnosis not present

## 2022-05-22 DIAGNOSIS — M25562 Pain in left knee: Secondary | ICD-10-CM | POA: Diagnosis not present

## 2022-05-22 DIAGNOSIS — M25561 Pain in right knee: Secondary | ICD-10-CM | POA: Diagnosis not present

## 2022-05-22 DIAGNOSIS — Z79899 Other long term (current) drug therapy: Secondary | ICD-10-CM | POA: Diagnosis not present

## 2022-05-22 DIAGNOSIS — G629 Polyneuropathy, unspecified: Secondary | ICD-10-CM | POA: Diagnosis not present

## 2022-05-22 DIAGNOSIS — G8929 Other chronic pain: Secondary | ICD-10-CM | POA: Diagnosis not present

## 2022-06-03 ENCOUNTER — Inpatient Hospital Stay (HOSPITAL_COMMUNITY)
Admission: EM | Admit: 2022-06-03 | Discharge: 2022-06-06 | DRG: 871 | Disposition: A | Discharge status: Home / Self Care | Payer: Medicare Other | Attending: Internal Medicine | Admitting: Internal Medicine

## 2022-06-03 ENCOUNTER — Emergency Department (HOSPITAL_COMMUNITY): Payer: Medicare Other

## 2022-06-03 DIAGNOSIS — F259 Schizoaffective disorder, unspecified: Secondary | ICD-10-CM | POA: Diagnosis present

## 2022-06-03 DIAGNOSIS — F319 Bipolar disorder, unspecified: Secondary | ICD-10-CM | POA: Diagnosis present

## 2022-06-03 DIAGNOSIS — Z791 Long term (current) use of non-steroidal anti-inflammatories (NSAID): Secondary | ICD-10-CM

## 2022-06-03 DIAGNOSIS — A419 Sepsis, unspecified organism: Secondary | ICD-10-CM | POA: Diagnosis not present

## 2022-06-03 DIAGNOSIS — R4182 Altered mental status, unspecified: Secondary | ICD-10-CM | POA: Diagnosis present

## 2022-06-03 DIAGNOSIS — R000 Tachycardia, unspecified: Secondary | ICD-10-CM | POA: Diagnosis not present

## 2022-06-03 DIAGNOSIS — Z87891 Personal history of nicotine dependence: Secondary | ICD-10-CM

## 2022-06-03 DIAGNOSIS — Z833 Family history of diabetes mellitus: Secondary | ICD-10-CM | POA: Diagnosis not present

## 2022-06-03 DIAGNOSIS — Z83438 Family history of other disorder of lipoprotein metabolism and other lipidemia: Secondary | ICD-10-CM | POA: Diagnosis not present

## 2022-06-03 DIAGNOSIS — Z7985 Long-term (current) use of injectable non-insulin antidiabetic drugs: Secondary | ICD-10-CM

## 2022-06-03 DIAGNOSIS — R410 Disorientation, unspecified: Secondary | ICD-10-CM | POA: Diagnosis not present

## 2022-06-03 DIAGNOSIS — R6520 Severe sepsis without septic shock: Secondary | ICD-10-CM | POA: Diagnosis not present

## 2022-06-03 DIAGNOSIS — Z8349 Family history of other endocrine, nutritional and metabolic diseases: Secondary | ICD-10-CM

## 2022-06-03 DIAGNOSIS — Z20822 Contact with and (suspected) exposure to covid-19: Secondary | ICD-10-CM | POA: Diagnosis present

## 2022-06-03 DIAGNOSIS — Z8719 Personal history of other diseases of the digestive system: Secondary | ICD-10-CM | POA: Diagnosis not present

## 2022-06-03 DIAGNOSIS — Z888 Allergy status to other drugs, medicaments and biological substances status: Secondary | ICD-10-CM

## 2022-06-03 DIAGNOSIS — R918 Other nonspecific abnormal finding of lung field: Secondary | ICD-10-CM | POA: Diagnosis not present

## 2022-06-03 DIAGNOSIS — R6889 Other general symptoms and signs: Secondary | ICD-10-CM | POA: Diagnosis not present

## 2022-06-03 DIAGNOSIS — E876 Hypokalemia: Secondary | ICD-10-CM | POA: Diagnosis not present

## 2022-06-03 DIAGNOSIS — J189 Pneumonia, unspecified organism: Secondary | ICD-10-CM | POA: Diagnosis present

## 2022-06-03 DIAGNOSIS — K219 Gastro-esophageal reflux disease without esophagitis: Secondary | ICD-10-CM | POA: Diagnosis present

## 2022-06-03 DIAGNOSIS — Z8249 Family history of ischemic heart disease and other diseases of the circulatory system: Secondary | ICD-10-CM | POA: Diagnosis not present

## 2022-06-03 DIAGNOSIS — E039 Hypothyroidism, unspecified: Secondary | ICD-10-CM | POA: Diagnosis not present

## 2022-06-03 DIAGNOSIS — E782 Mixed hyperlipidemia: Secondary | ICD-10-CM | POA: Diagnosis present

## 2022-06-03 DIAGNOSIS — Z7901 Long term (current) use of anticoagulants: Secondary | ICD-10-CM

## 2022-06-03 DIAGNOSIS — R531 Weakness: Secondary | ICD-10-CM | POA: Diagnosis not present

## 2022-06-03 DIAGNOSIS — Z9884 Bariatric surgery status: Secondary | ICD-10-CM

## 2022-06-03 DIAGNOSIS — K760 Fatty (change of) liver, not elsewhere classified: Secondary | ICD-10-CM | POA: Diagnosis present

## 2022-06-03 DIAGNOSIS — Z79899 Other long term (current) drug therapy: Secondary | ICD-10-CM

## 2022-06-03 DIAGNOSIS — E1140 Type 2 diabetes mellitus with diabetic neuropathy, unspecified: Secondary | ICD-10-CM | POA: Diagnosis not present

## 2022-06-03 DIAGNOSIS — R824 Acetonuria: Secondary | ICD-10-CM | POA: Diagnosis present

## 2022-06-03 DIAGNOSIS — Z79890 Hormone replacement therapy (postmenopausal): Secondary | ICD-10-CM

## 2022-06-03 DIAGNOSIS — Z90710 Acquired absence of both cervix and uterus: Secondary | ICD-10-CM | POA: Diagnosis not present

## 2022-06-03 DIAGNOSIS — Z91013 Allergy to seafood: Secondary | ICD-10-CM

## 2022-06-03 DIAGNOSIS — E118 Type 2 diabetes mellitus with unspecified complications: Secondary | ICD-10-CM

## 2022-06-03 DIAGNOSIS — Z794 Long term (current) use of insulin: Secondary | ICD-10-CM

## 2022-06-03 DIAGNOSIS — E872 Acidosis, unspecified: Secondary | ICD-10-CM | POA: Diagnosis not present

## 2022-06-03 DIAGNOSIS — I10 Essential (primary) hypertension: Secondary | ICD-10-CM | POA: Diagnosis not present

## 2022-06-03 DIAGNOSIS — Z981 Arthrodesis status: Secondary | ICD-10-CM | POA: Diagnosis not present

## 2022-06-03 DIAGNOSIS — Z743 Need for continuous supervision: Secondary | ICD-10-CM | POA: Diagnosis not present

## 2022-06-03 DIAGNOSIS — Z7984 Long term (current) use of oral hypoglycemic drugs: Secondary | ICD-10-CM

## 2022-06-03 DIAGNOSIS — R0689 Other abnormalities of breathing: Secondary | ICD-10-CM | POA: Diagnosis not present

## 2022-06-03 DIAGNOSIS — J302 Other seasonal allergic rhinitis: Secondary | ICD-10-CM | POA: Diagnosis present

## 2022-06-03 LAB — CBC WITH DIFFERENTIAL/PLATELET
Abs Immature Granulocytes: 0.17 10*3/uL — ABNORMAL HIGH (ref 0.00–0.07)
Basophils Absolute: 0.1 10*3/uL (ref 0.0–0.1)
Basophils Relative: 0 %
Eosinophils Absolute: 0 10*3/uL (ref 0.0–0.5)
Eosinophils Relative: 0 %
HCT: 40.7 % (ref 36.0–46.0)
Hemoglobin: 14.4 g/dL (ref 12.0–15.0)
Immature Granulocytes: 1 %
Lymphocytes Relative: 7 %
Lymphs Abs: 1.6 10*3/uL (ref 0.7–4.0)
MCH: 32.1 pg (ref 26.0–34.0)
MCHC: 35.4 g/dL (ref 30.0–36.0)
MCV: 90.6 fL (ref 80.0–100.0)
Monocytes Absolute: 1.7 10*3/uL — ABNORMAL HIGH (ref 0.1–1.0)
Monocytes Relative: 8 %
Neutro Abs: 18.2 10*3/uL — ABNORMAL HIGH (ref 1.7–7.7)
Neutrophils Relative %: 84 %
Platelets: 327 10*3/uL (ref 150–400)
RBC: 4.49 MIL/uL (ref 3.87–5.11)
RDW: 12.9 % (ref 11.5–15.5)
WBC: 21.7 10*3/uL — ABNORMAL HIGH (ref 4.0–10.5)
nRBC: 0 % (ref 0.0–0.2)

## 2022-06-03 LAB — URINALYSIS, ROUTINE W REFLEX MICROSCOPIC
Bilirubin Urine: NEGATIVE
Glucose, UA: NEGATIVE mg/dL
Hgb urine dipstick: NEGATIVE
Ketones, ur: 5 mg/dL — AB
Leukocytes,Ua: NEGATIVE
Nitrite: NEGATIVE
Protein, ur: NEGATIVE mg/dL
Specific Gravity, Urine: 1.017 (ref 1.005–1.030)
pH: 5 (ref 5.0–8.0)

## 2022-06-03 LAB — COMPREHENSIVE METABOLIC PANEL
ALT: 30 U/L (ref 0–44)
AST: 36 U/L (ref 15–41)
Albumin: 4 g/dL (ref 3.5–5.0)
Alkaline Phosphatase: 70 U/L (ref 38–126)
Anion gap: 12 (ref 5–15)
BUN: 10 mg/dL (ref 8–23)
CO2: 28 mmol/L (ref 22–32)
Calcium: 8.7 mg/dL — ABNORMAL LOW (ref 8.9–10.3)
Chloride: 100 mmol/L (ref 98–111)
Creatinine, Ser: 0.67 mg/dL (ref 0.44–1.00)
GFR, Estimated: >60 mL/min (ref >60)
Glucose, Bld: 218 mg/dL — ABNORMAL HIGH (ref 70–99)
Potassium: 3.1 mmol/L — ABNORMAL LOW (ref 3.5–5.1)
Sodium: 140 mmol/L (ref 135–145)
Total Bilirubin: 1.1 mg/dL (ref 0.3–1.2)
Total Protein: 7 g/dL (ref 6.5–8.1)

## 2022-06-03 LAB — LACTIC ACID, PLASMA
Lactic Acid, Venous: 2.2 mmol/L (ref 0.5–1.9)
Lactic Acid, Venous: 2.8 mmol/L (ref 0.5–1.9)
Lactic Acid, Venous: 1.9 mmol/L (ref 0.5–1.9)

## 2022-06-03 LAB — RESP PANEL BY RT-PCR (FLU A&B, COVID) ARPGX2
Influenza A by PCR: NEGATIVE
Influenza B by PCR: NEGATIVE
SARS Coronavirus 2 by RT PCR: NEGATIVE

## 2022-06-03 LAB — APTT: aPTT: 28 seconds (ref 24–36)

## 2022-06-03 LAB — GLUCOSE, CAPILLARY: Glucose-Capillary: 170 mg/dL — ABNORMAL HIGH (ref 70–99)

## 2022-06-03 LAB — PROTIME-INR
INR: 1 (ref 0.8–1.2)
Prothrombin Time: 13.5 seconds (ref 11.4–15.2)

## 2022-06-03 MED ORDER — LACTATED RINGERS IV BOLUS (SEPSIS)
1000.0000 mL | Freq: Once | INTRAVENOUS | Status: AC
  Administered 2022-06-03: 1000 mL via INTRAVENOUS

## 2022-06-03 MED ORDER — ACETAMINOPHEN 325 MG PO TABS
650.0000 mg | ORAL_TABLET | Freq: Four times a day (QID) | ORAL | Status: DC | PRN
  Administered 2022-06-03 – 2022-06-05 (×4): 650 mg via ORAL
  Filled 2022-06-03 (×4): qty 2

## 2022-06-03 MED ORDER — SODIUM CHLORIDE 0.9 % IV SOLN
500.0000 mg | INTRAVENOUS | Status: DC
  Administered 2022-06-03 – 2022-06-04 (×2): 500 mg via INTRAVENOUS
  Filled 2022-06-03 (×2): qty 5

## 2022-06-03 MED ORDER — INSULIN GLARGINE-YFGN 100 UNIT/ML ~~LOC~~ SOLN
20.0000 [IU] | Freq: Every day | SUBCUTANEOUS | Status: DC
  Administered 2022-06-04 – 2022-06-06 (×3): 20 [IU] via SUBCUTANEOUS
  Filled 2022-06-03 (×3): qty 0.2

## 2022-06-03 MED ORDER — ACETAMINOPHEN 650 MG RE SUPP
975.0000 mg | RECTAL | Status: DC | PRN
  Filled 2022-06-03: qty 1

## 2022-06-03 MED ORDER — POTASSIUM CHLORIDE CRYS ER 20 MEQ PO TBCR
40.0000 meq | EXTENDED_RELEASE_TABLET | Freq: Once | ORAL | Status: AC
  Administered 2022-06-03: 40 meq via ORAL
  Filled 2022-06-03: qty 2

## 2022-06-03 MED ORDER — BISOPROLOL FUMARATE 5 MG PO TABS
10.0000 mg | ORAL_TABLET | Freq: Every day | ORAL | Status: DC
  Administered 2022-06-04 – 2022-06-06 (×3): 10 mg via ORAL
  Filled 2022-06-03 (×3): qty 2

## 2022-06-03 MED ORDER — GABAPENTIN 400 MG PO CAPS: 800.0000 mg | ORAL_CAPSULE | Freq: Three times a day (TID) | ORAL | Status: DC

## 2022-06-03 MED ORDER — HYDROCHLOROTHIAZIDE 12.5 MG PO TABS
6.2500 mg | ORAL_TABLET | Freq: Every day | ORAL | Status: DC
  Administered 2022-06-04 – 2022-06-06 (×3): 6.25 mg via ORAL
  Filled 2022-06-03 (×3): qty 1

## 2022-06-03 MED ORDER — LACTATED RINGERS IV BOLUS (SEPSIS)
500.0000 mL | Freq: Once | INTRAVENOUS | Status: AC
  Administered 2022-06-03: 500 mL via INTRAVENOUS

## 2022-06-03 MED ORDER — INSULIN ASPART 100 UNIT/ML IJ SOLN
0.0000 [IU] | Freq: Three times a day (TID) | INTRAMUSCULAR | Status: DC
  Administered 2022-06-04: 5 [IU] via SUBCUTANEOUS
  Administered 2022-06-04: 3 [IU] via SUBCUTANEOUS
  Administered 2022-06-05: 2 [IU] via SUBCUTANEOUS
  Administered 2022-06-05 – 2022-06-06 (×2): 5 [IU] via SUBCUTANEOUS

## 2022-06-03 MED ORDER — ACETAMINOPHEN 650 MG RE SUPP: 650.0000 mg | Freq: Four times a day (QID) | RECTAL | Status: DC | PRN

## 2022-06-03 MED ORDER — ONDANSETRON HCL 4 MG PO TABS: 4.0000 mg | ORAL_TABLET | Freq: Four times a day (QID) | ORAL | Status: DC | PRN

## 2022-06-03 MED ORDER — BISOPROLOL-HYDROCHLOROTHIAZIDE 10-6.25 MG PO TABS: 1.0000 | ORAL_TABLET | Freq: Every day | ORAL | Status: DC

## 2022-06-03 MED ORDER — RISPERIDONE 1 MG PO TABS
1.0000 mg | ORAL_TABLET | Freq: Two times a day (BID) | ORAL | Status: DC
  Administered 2022-06-03 – 2022-06-06 (×6): 1 mg via ORAL
  Filled 2022-06-03 (×6): qty 1

## 2022-06-03 MED ORDER — ENOXAPARIN SODIUM 40 MG/0.4ML IJ SOSY
40.0000 mg | PREFILLED_SYRINGE | INTRAMUSCULAR | Status: DC
  Administered 2022-06-03 – 2022-06-05 (×3): 40 mg via SUBCUTANEOUS
  Filled 2022-06-03 (×3): qty 0.4

## 2022-06-03 MED ORDER — PANTOPRAZOLE SODIUM 40 MG PO TBEC
40.0000 mg | DELAYED_RELEASE_TABLET | Freq: Every day | ORAL | Status: DC
  Administered 2022-06-04 – 2022-06-06 (×3): 40 mg via ORAL
  Filled 2022-06-03 (×3): qty 1

## 2022-06-03 MED ORDER — FERROUS SULFATE 325 (65 FE) MG PO TABS
325.0000 mg | ORAL_TABLET | Freq: Every day | ORAL | Status: DC
  Administered 2022-06-04 – 2022-06-06 (×3): 325 mg via ORAL
  Filled 2022-06-03 (×3): qty 1

## 2022-06-03 MED ORDER — ONDANSETRON HCL 4 MG/2ML IJ SOLN: 4.0000 mg | Freq: Four times a day (QID) | INTRAMUSCULAR | Status: DC | PRN

## 2022-06-03 MED ORDER — GABAPENTIN 100 MG PO CAPS
200.0000 mg | ORAL_CAPSULE | Freq: Three times a day (TID) | ORAL | Status: DC
  Administered 2022-06-03 – 2022-06-06 (×7): 200 mg via ORAL
  Filled 2022-06-03 (×8): qty 2

## 2022-06-03 MED ORDER — DULOXETINE HCL 30 MG PO CPEP
30.0000 mg | ORAL_CAPSULE | Freq: Every day | ORAL | Status: DC
  Administered 2022-06-04 – 2022-06-06 (×3): 30 mg via ORAL
  Filled 2022-06-03 (×3): qty 1

## 2022-06-03 MED ORDER — METFORMIN HCL 500 MG PO TABS: 1000.0000 mg | ORAL_TABLET | Freq: Every day | ORAL | Status: DC

## 2022-06-03 MED ORDER — LEVOTHYROXINE SODIUM 112 MCG PO TABS
112.0000 ug | ORAL_TABLET | Freq: Every day | ORAL | Status: DC
  Administered 2022-06-04 – 2022-06-06 (×3): 112 ug via ORAL
  Filled 2022-06-03 (×3): qty 1

## 2022-06-03 MED ORDER — OXYBUTYNIN CHLORIDE ER 5 MG PO TB24
5.0000 mg | ORAL_TABLET | Freq: Every day | ORAL | Status: DC
  Administered 2022-06-03 – 2022-06-05 (×3): 5 mg via ORAL
  Filled 2022-06-03 (×3): qty 1

## 2022-06-03 MED ORDER — LACTATED RINGERS IV BOLUS
1000.0000 mL | Freq: Once | INTRAVENOUS | Status: AC
  Administered 2022-06-03: 1000 mL via INTRAVENOUS

## 2022-06-03 MED ORDER — SODIUM CHLORIDE 0.9 % IV SOLN
2.0000 g | INTRAVENOUS | Status: DC
  Administered 2022-06-03 – 2022-06-05 (×3): 2 g via INTRAVENOUS
  Filled 2022-06-03 (×3): qty 20

## 2022-06-03 MED ORDER — LACTATED RINGERS IV SOLN: INTRAVENOUS | Status: AC

## 2022-06-03 MED ORDER — LACTATED RINGERS IV SOLN: INTRAVENOUS | Status: DC

## 2022-06-03 MED ORDER — SIMVASTATIN 20 MG PO TABS
20.0000 mg | ORAL_TABLET | Freq: Every day | ORAL | Status: DC
  Administered 2022-06-03 – 2022-06-05 (×3): 20 mg via ORAL
  Filled 2022-06-03 (×3): qty 1

## 2022-06-03 MED ORDER — TRAZODONE HCL 100 MG PO TABS
200.0000 mg | ORAL_TABLET | Freq: Every day | ORAL | Status: DC
  Administered 2022-06-03 – 2022-06-05 (×3): 200 mg via ORAL
  Filled 2022-06-03 (×3): qty 2

## 2022-06-03 NOTE — H&P (Signed)
History and Physical    Patient: Alexandra [name] OYD:[id number] DOB: 07-31-1960 DOA: 06/03/2022 DOS: the patient was seen and examined on 06/03/2022 PCP: Dorothyann Peng, NP  Patient coming from: Home  Chief Complaint:  Chief Complaint  Patient presents with   Altered Mental Status   HPI: Alexandra [name] is a 61 y.o. female with medical history significant of seasonal allergies, anemia, schizoaffective disorder, bipolar disorder, type 2 diabetes, fatty liver disease, GERD, hiatal hernia, hyperlipidemia, hyperplastic colon polyp, hypertension, hypothyroidism, history of stroke, left-sided hemiparesis, history of neuropathy, right rotator cuff tear who is brought to the emergency department due to fever and altered mental status.  This morning her husband found her on the couch and called EMS.  She stated she has been coughing for the past 2 weeks.  She has had trouble expectorating phlegm.  She also has a sore throat for the past 2 days.  Her appetite is decreased.  No  wheezing or hemoptysis.  No chest pain, palpitations, diaphoresis, PND, orthopnea or pitting edema of the lower extremities.  No abdominal pain, diarrhea, constipation, melena or hematochezia.  No flank pain, dysuria, frequency or hematuria.  No polyuria, polydipsia, polyphagia or blurred vision.  ED course: Initial vital signs were temperature 103 F, pulse 117, respiration 30, BP 168/87 mmHg O2 sat 96% on room air.  The patient received 2500 mL of normal saline bolus, K-Lor 40 mEq p.o. x1, ceftriaxone and Rocephin IVPB  Lab work: Urinalysis was hazy with ketonuria 5 mg/dL, but otherwise normal.  Lactic acid was 2.2 mmol/L.  CBC showed a white count 21.7 with 84% neutrophils, hemoglobin 14.4 g/dL and platelets 327.  PT 13.5, INR 1.0 and PTT 28.  CMP with a potassium of 3.1 mmol/L, glucose 218 and calcium 8.7 mg/dL.  Imaging: Portable 1 view chest radiograph with a possible left retrocardiac pulmonary opacity.  Consider  atelectasis, aspiration or infection.  CT head without contrast with no acute intracranial normality.  Review of Systems: As mentioned in the history of present illness. All other systems reviewed and are negative. Past Medical History:  Diagnosis Date   Allergy    Anemia    past hx   Blood transfusion without reported diagnosis    Diabetes mellitus ([place])    Fatty liver    GERD (gastroesophageal reflux disease)    prn med.   Hiatal hernia 07/29/1996   Hyperlipidemia    Hyperplastic colon polyp    Hypertension    new dx. - will start med. 06/28/2011   Hypothyroidism    Neuromuscular disorder ([place])    hemiplegia , neuropathy   Rotator cuff tear, right    Stroke [facility])    TIA 2016   Past Surgical History:  Procedure Laterality Date   ABDOMINAL HYSTERECTOMY  06/25/2000   left salpingo-oophorectomy   BACK SURGERY  07/30/1995   discetomy, lumbar spinal fusion   CARDIAC CATHETERIZATION  04/01/2006   CESAREAN SECTION  07/29/1989   CHOLECYSTECTOMY  07/30/1987   COLONOSCOPY     EXPLORATORY LAPAROTOMY  05/08/2000   right salpingo-oophorectomy   LAMINOTOMY / EXCISION DISK POSTERIOR CERVICAL SPINE  08/10/2009   C7-T1   MENISCUS REPAIR Right 04/2021   Knee   POLYPECTOMY     [facility] 2012   ROUX-EN-Y GASTRIC BYPASS  05/19/2007   SHOULDER ARTHROSCOPY  01/02/2006; 05/31/2004   left 2007; right 2005   SHOULDER ARTHROSCOPY  07/02/2011   Procedure: ARTHROSCOPY SHOULDER;  Surgeon: Cammie Sickle., MD;  Location: [facility];  Service: Orthopedics;  Laterality: Right;  repair supraspinatus, Debride glenohumeral joint, labrum   SHOULDER ARTHROSCOPY DISTAL CLAVICLE EXCISION AND OPEN ROTATOR CUFF REPAIR  07/26/2010   right   THYROID SURGERY  early 2000s   goiter   TOTAL 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/2021   Procedure: TOTAL KNEE ARTHROPLASTY;  Surgeon: Renette Butters, MD;  Location: [facility];  Service: Orthopedics;  Laterality: Right;   TUBAL LIGATION  1993   Social History:   reports that she quit smoking about 37 years ago. Her smoking use included cigarettes. She has a 10.00 pack-year smoking history. She has never used smokeless tobacco. She reports current alcohol use. She reports that she does not use drugs.  Allergies  Allergen Reactions   Shrimp (Diagnostic) Anaphylaxis    Eating shrimp causes swelling of tongue and rash   Ace Inhibitors Cough    Family History  Problem Relation Age of Onset   Diabetes Mother    Hyperlipidemia Mother    Hypertension Mother    Thyroid disease Mother    Rheum arthritis Mother    COPD Mother    Cancer Paternal Grandfather        Prostate Cancer   Coronary artery disease Other    Colon cancer Neg Hx    Colon polyps Neg Hx    Esophageal cancer Neg Hx    Rectal cancer Neg Hx    Stomach cancer Neg Hx    Pancreatic cancer Neg Hx    Sleep apnea Neg Hx     Prior to Admission medications   Medication Sig Start Date End Date Taking? Authorizing Provider  acetaminophen (TYLENOL) 500 MG tablet Take 2 tablets (1,000 mg total) by mouth every 6 (six) hours as needed for mild pain or moderate pain. 11/13/21  Yes Gawne, [place], PA-C  B Complex Vitamins (B COMPLEX PO) Take 1 tablet by mouth daily.   Yes [provider]  BIOTIN PO Take 1 tablet by mouth daily.   Yes [provider]  bisoprolol-hydrochlorothiazide (ZIAC) 10-6.25 MG tablet Take 1 tablet by mouth daily. 09/10/21  Yes Nafziger, Tommi Rumps, NP  Cholecalciferol (VITAMIN D3 PO) Take 1 tablet by mouth daily.   Yes [provider]  diclofenac Sodium (VOLTAREN) 1 % GEL 2 g 3 (three) times daily as needed (pain). 09/19/21  Yes [provider]  DULoxetine (CYMBALTA) 30 MG capsule Take 30 mg by mouth every morning. 09/17/21  Yes [provider]  ferrous sulfate 325 (65 FE) MG EC tablet Take 325 mg by mouth daily with breakfast.   Yes [provider]  fluticasone (FLONASE) 50 MCG/ACT nasal spray USE 2 SPRAYS IN EACH NOSTRIL  DAILY Patient taking differently: 2 sprays daily as needed for allergies or rhinitis. 06/26/20  Yes Nafziger, Tommi Rumps, NP  gabapentin (NEURONTIN) 400 MG capsule TAKE 2 CAPSULES THREE TIMES A DAY Patient taking differently: Take 800 mg by mouth 3 (three) times daily. 09/10/21  Yes Nafziger, Tommi Rumps, NP  HYDROcodone-acetaminophen (NORCO/VICODIN) 5-325 MG tablet Take 1 tablet by mouth every 8 (eight) hours as needed for moderate pain. 02/19/21  Yes [provider]  insulin glargine (LANTUS SOLOSTAR) 100 UNIT/ML Solostar Pen Inject 20 Units into the skin daily. 05/03/22 08/01/22 Yes Nafziger, Tommi Rumps, NP  levothyroxine (SYNTHROID) 112 MCG tablet Take 1 tablet (112 mcg total) by mouth daily. 09/10/21  Yes Nafziger, Tommi Rumps, NP  meloxicam (MOBIC) 7.5 MG tablet Take 7.5 mg by mouth daily. 03/11/22  Yes [provider]  metFORMIN (GLUCOPHAGE)  1000 MG tablet TAKE 1 TABLET BY MOUTH DAILY  WITH BREAKFAST Patient taking differently: Take 1,000 mg by mouth daily with breakfast. 11/27/21  Yes Nafziger, Tommi Rumps, NP  ondansetron (ZOFRAN-ODT) 4 MG disintegrating tablet Take 1 tablet (4 mg total) by mouth 2 (two) times daily as needed for nausea or vomiting. 11/13/21  Yes Gawne, Meghan M, PA-C  oxybutynin (DITROPAN-XL) 5 MG 24 hr tablet Take 1 tablet (5 mg total) by mouth at bedtime. 09/10/21  Yes Nafziger, Tommi Rumps, NP  pantoprazole (PROTONIX) 40 MG tablet Take 1 tablet (40 mg total) by mouth daily. 12/17/21  Yes Nafziger, Tommi Rumps, NP  risperiDONE (RISPERDAL) 1 MG tablet Take 1 mg by mouth 2 (two) times daily. 06/29/21  Yes [provider]  simvastatin (ZOCOR) 20 MG tablet Take 1 tablet (20 mg total) by mouth at bedtime. 09/10/21  Yes Nafziger, Tommi Rumps, NP  traZODone (DESYREL) 100 MG tablet Take 200 mg by mouth at bedtime. 06/28/21  Yes [provider]  TRULICITY 3 YN/8.2NF SOPN INJECT THE CONTENTS OF ONE PEN  SUBCUTANEOUSLY WEEKLY AS  DIRECTED Patient taking differently: Inject 3 mg into the skin once a week. Monday  03/18/22  Yes Nafziger, Tommi Rumps, NP  VITAMIN E PO Take 1 capsule by mouth daily.   Yes [provider]  Blood Glucose Monitoring Suppl (ONETOUCH VERIO) w/Device KIT 1 Device by Does not apply route daily. Use to check blood glucose 4 times day or prn 09/10/21   Nafziger, Tommi Rumps, NP  Continuous Blood Gluc Sensor (FREESTYLE LIBRE 3 SENSOR) MISC 1 Device by Does not apply route every 14 (fourteen) days. Place 1 sensor on the skin every 14 days. Use to check glucose continuously 04/30/22   Nafziger, Tommi Rumps, NP  glucose blood (ONETOUCH VERIO) test strip Use to check blood sugar 4times a day or PRn 09/10/21   Nafziger, Tommi Rumps, NP  Insulin Pen Needle (B-D UF III MINI PEN NEEDLES) 31G X 5 MM MISC USE WITH INSULIN PEN AS DIRECTED 04/29/22   Dorothyann Peng, NP  Lancet Devices MISC Use to check blood sugar TID 04/17/22   Dorothyann Peng, NP  ONE [facility] LANCETS MISC Test blood sugars once daily Dx: E11.9 08/01/17   Dorothyann Peng, NP    Physical Exam: Vitals:   06/03/22 1200 06/03/22 1215 06/03/22 1315 06/03/22 1330  BP: (!) 159/83 (!) 154/80 (!) 161/87 (!) 150/90  Pulse: (!) 115 (!) 115 (!) 114 (!) 113  Resp: (!) 31 (!) 28 (!) 28 (!) 24  Temp:      TempSrc:      SpO2: 95% 93% 97% 97%  Weight:      Height:       Physical Exam Vitals and nursing note reviewed.  Constitutional:      General: She is awake. She is not in acute distress.    Appearance: Normal appearance. She is obese. She is ill-appearing.  HENT:     Head: Normocephalic.     Nose: No rhinorrhea.     Mouth/Throat:     Mouth: Mucous membranes are dry.  Eyes:     General: No scleral icterus.    Pupils: Pupils are equal, round, and reactive to light.  Neck:     Vascular: No JVD.  Cardiovascular:     Rate and Rhythm: Regular rhythm. Tachycardia present.     Heart sounds: S1 normal and S2 normal.  Pulmonary:     Effort: Pulmonary effort is normal.     Breath sounds: Rhonchi present. No wheezing or rales.  Abdominal:     General: Bowel  sounds are normal. There is no distension.     Palpations: Abdomen is soft.     Tenderness: There is no abdominal tenderness. There is no guarding.  Musculoskeletal:     Cervical back: Neck supple.     Right lower leg: No edema.     Left lower leg: No edema.  Skin:    General: Skin is warm and dry.  Neurological:     General: No focal deficit present.     Mental Status: She is alert and oriented to person, place, and time.  Psychiatric:        Mood and Affect: Mood normal.        Behavior: Behavior normal. Behavior is cooperative.   Data Reviewed:  Results are pending, will review when available.  Assessment and Plan: Principal Problem:   Sepsis due to undetermined organism ([place]) In the setting of Community-acquired pneumonia Admit to PCU/inpatient. Continue supplemental oxygen. Scheduled and as needed bronchodilators. Continue ceftriaxone 1 g IVPB daily. Continue azithromycin 500 mg IVPB daily. Check strep pneumoniae urinary antigen. Check sputum Gram stain, culture and sensitivity. Follow-up blood culture and sensitivity. Follow-up CBC and chemistry in the morning.  Active Problems:   Hypothyroidism Continue levothyroxine 112 mcg p.o. daily.    Mixed hyperlipidemia Continue simvastatin 20 mg p.o. daily.    Type 2 diabetes mellitus with complication,    with long-term current use of insulin (HCC)  Carbohydrate modified diet. Continue Lantus 20 units daily. CBG monitoring before meals and bedtime. Continue duloxetine and gabapentin for neuropathy.    Essential hypertension Continue bisoprolol and hydrochlorothiazide.    Advance Care Planning:   Code Status: Full Code   Consults:   Family Communication:   Severity of Illness: The appropriate patient status for this patient is INPATIENT. Inpatient status is judged to be reasonable and necessary in order to provide the required intensity of service to ensure the patient's safety. The patient's presenting  symptoms, physical exam findings, and initial radiographic and laboratory data in the context of their chronic comorbidities is felt to place them at high risk for further clinical deterioration. Furthermore, it is not anticipated that the patient will be medically stable for discharge from the hospital within 2 midnights of admission.   * I certify that at the point of admission it is my clinical judgment that the patient will require inpatient hospital care spanning beyond 2 midnights from the point of admission due to high intensity of service, high risk for further deterioration and high frequency of surveillance required.*  Author: Reubin Milan, MD 06/03/2022 2:31 PM  For on call review [email or web address].  This document was prepared using Dragon voice recognition software and may contain some unintended transcription errors.

## 2022-06-03 NOTE — ED Provider Notes (Addendum)
[facility] DEPT Provider Note   CSN: [id number] Arrival date & time: 06/03/22  1103     History  Chief Complaint  Patient presents with   Altered Mental Status    Alexandra [name] is a 61 y.o. female.  61 year old female presents with cute onset of altered mental status.  According to her husband, patient is at her baseline state of health this morning when he found her on the couch.  Last seen normal was a few hours before that.  Has had URI symptoms recently.  Called EMS and patient found to be febrile.  Blood sugar was 212.  Has had some cough.  No emesis appreciated.  Patient is on Xarelto but has not had any history of trauma.       Home Medications Prior to Admission medications   Medication Sig Start Date End Date Taking? Authorizing Provider  acetaminophen (TYLENOL) 500 MG tablet Take 2 tablets (1,000 mg total) by mouth every 6 (six) hours as needed for mild pain or moderate pain. 11/13/21   Britt Bottom, PA-C  B Complex Vitamins (B COMPLEX PO) Take 1 tablet by mouth daily.    [provider]  BIOTIN PO Take 1 tablet by mouth daily.    [provider]  bisoprolol-hydrochlorothiazide (ZIAC) 10-6.25 MG tablet Take 1 tablet by mouth daily. 09/10/21   Nafziger, Tommi Rumps, NP  Blood Glucose Monitoring Suppl (ONETOUCH VERIO) w/Device KIT 1 Device by Does not apply route daily. Use to check blood glucose 4 times day or prn 09/10/21   Dorothyann Peng, NP  Cholecalciferol (VITAMIN D3 PO) Take 1 tablet by mouth daily.    [provider]  Continuous Blood Gluc Sensor (FREESTYLE LIBRE 3 SENSOR) MISC 1 Device by Does not apply route every 14 (fourteen) days. Place 1 sensor on the skin every 14 days. Use to check glucose continuously 04/30/22   Nafziger, Tommi Rumps, NP  diclofenac Sodium (VOLTAREN) 1 % GEL 2 g 3 (three) times daily as needed (pain). 09/19/21   [provider]  dicyclomine (BENTYL) 20 MG tablet Take 1 tablet (20 mg  total) by mouth 3 (three) times daily before meals for 5 days. 01/16/22 01/21/22  Nafziger, Tommi Rumps, NP  DULoxetine (CYMBALTA) 30 MG capsule Take 30 mg by mouth every morning. 09/17/21   [provider]  ferrous sulfate 325 (65 FE) MG EC tablet Take 325 mg by mouth daily with breakfast.    [provider]  fluticasone (FLONASE) 50 MCG/ACT nasal spray USE 2 SPRAYS IN EACH NOSTRIL DAILY Patient taking differently: 2 sprays daily as needed for allergies or rhinitis. 06/26/20   Nafziger, Tommi Rumps, NP  gabapentin (NEURONTIN) 400 MG capsule TAKE 2 CAPSULES THREE TIMES A DAY 09/10/21   Nafziger, Tommi Rumps, NP  glucose blood (ONETOUCH VERIO) test strip Use to check blood sugar 4times a day or PRn 09/10/21   Nafziger, Tommi Rumps, NP  HYDROcodone-acetaminophen (NORCO/VICODIN) 5-325 MG tablet Take 1 tablet by mouth 3 (three) times daily. 02/19/21   [provider]  insulin glargine (LANTUS SOLOSTAR) 100 UNIT/ML Solostar Pen Inject 20 Units into the skin daily. 05/03/22 08/01/22  Nafziger, Tommi Rumps, NP  Insulin Pen Needle (B-D UF III MINI PEN NEEDLES) 31G X 5 MM MISC USE WITH INSULIN PEN AS DIRECTED 04/29/22   Dorothyann Peng, NP  Lancet Devices MISC Use to check blood sugar TID 04/17/22   Dorothyann Peng, NP  levothyroxine (SYNTHROID) 112 MCG tablet Take 1 tablet (112 mcg total) by mouth daily. 09/10/21  Nafziger, Tommi Rumps, NP  meloxicam (MOBIC) 7.5 MG tablet Take 7.5 mg by mouth daily. 03/11/22   [provider]  metFORMIN (GLUCOPHAGE) 1000 MG tablet TAKE 1 TABLET BY MOUTH DAILY  WITH BREAKFAST 11/27/21   Nafziger, Tommi Rumps, NP  ondansetron (ZOFRAN-ODT) 4 MG disintegrating tablet Take 1 tablet (4 mg total) by mouth 2 (two) times daily as needed for nausea or vomiting. 11/13/21   Britt Bottom, PA-C  ONE TOUCH LANCETS MISC Test blood sugars once daily Dx: E11.9 08/01/17   Nafziger, Tommi Rumps, NP  oxybutynin (DITROPAN-XL) 5 MG 24 hr tablet Take 1 tablet (5 mg total) by mouth at bedtime. 09/10/21   Nafziger, Tommi Rumps, NP   pantoprazole (PROTONIX) 40 MG tablet Take 1 tablet (40 mg total) by mouth daily. 12/17/21   Nafziger, Tommi Rumps, NP  risperiDONE (RISPERDAL) 1 MG tablet Take 1 mg by mouth 2 (two) times daily. 06/29/21   [provider]  rivaroxaban (XARELTO) 10 MG TABS tablet Take 1 tablet (10 mg total) by mouth daily. To prevent blood clots after surgery. 11/13/21   Britt Bottom, PA-C  simvastatin (ZOCOR) 20 MG tablet Take 1 tablet (20 mg total) by mouth at bedtime. 09/10/21   Nafziger, Tommi Rumps, NP  traZODone (DESYREL) 100 MG tablet Take 200 mg by mouth at bedtime. 06/28/21   [provider]  TRULICITY 3 IE/3.3IR SOPN INJECT THE CONTENTS OF ONE PEN  SUBCUTANEOUSLY WEEKLY AS  DIRECTED 03/18/22   Nafziger, Tommi Rumps, NP  VITAMIN E PO Take 1 capsule by mouth daily.    [provider]      Allergies    Shrimp (diagnostic) and Ace inhibitors    Review of Systems   Review of Systems  All other systems reviewed and are negative.   Physical Exam Updated Vital Signs BP (!) 168/87 (BP Location: Right Arm)   Pulse (!) 117   Temp (!) 103 F (39.4 C) (Oral)   Resp (!) 30   SpO2 96%  Physical Exam Vitals and nursing note reviewed.  Constitutional:      General: She is not in acute distress.    Appearance: Normal appearance. She is well-developed. She is not toxic-appearing.  HENT:     Head: Normocephalic and atraumatic.  Eyes:     General: Lids are normal.     Conjunctiva/sclera: Conjunctivae normal.     Pupils: Pupils are equal, round, and reactive to light.  Neck:     Thyroid: No thyroid mass.     Trachea: No tracheal deviation.  Cardiovascular:     Rate and Rhythm: Normal rate and regular rhythm.     Heart sounds: Normal heart sounds. No murmur heard.    No gallop.  Pulmonary:     Effort: Pulmonary effort is normal. No respiratory distress.     Breath sounds: Normal breath sounds. No stridor. No decreased breath sounds, wheezing, rhonchi or rales.  Abdominal:     General: There is  no distension.     Palpations: Abdomen is soft.     Tenderness: There is no abdominal tenderness. There is no rebound.  Musculoskeletal:        General: No tenderness. Normal range of motion.     Cervical back: Normal range of motion and neck supple.  Skin:    General: Skin is warm and dry.     Findings: No abrasion or rash.  Neurological:     Mental Status: She is lethargic and disoriented.     GCS: GCS eye subscore is  4. GCS verbal subscore is 4. GCS motor subscore is 5.     Cranial Nerves: Cranial nerves 2-12 are intact. No cranial nerve deficit.     Sensory: No sensory deficit.     Motor: Motor function is intact. No tremor.  Psychiatric:        Attention and Perception: She is inattentive.        Speech: Speech is delayed.        Behavior: Behavior is slowed and withdrawn.     ED Results / Procedures / Treatments   Labs (all labs ordered are listed, but only abnormal results are displayed) Labs Reviewed  CULTURE, BLOOD (ROUTINE X 2)  CULTURE, BLOOD (ROUTINE X 2)  URINE CULTURE  RESP PANEL BY RT-PCR (FLU A&B, COVID) ARPGX2  LACTIC ACID, PLASMA  LACTIC ACID, PLASMA  COMPREHENSIVE METABOLIC PANEL  CBC WITH DIFFERENTIAL/PLATELET  PROTIME-INR  APTT  URINALYSIS, ROUTINE W REFLEX MICROSCOPIC    EKG EKG Interpretation  Date/Time:  Monday June 03 2022 11:04:16 EST Ventricular Rate:  116 PR Interval:  174 QRS Duration: 92 QT Interval:  298 QTC Calculation: 414 R Axis:   93 Text Interpretation: Sinus tachycardia Right axis deviation Nonspecific T abnormalities, diffuse leads Confirmed by Lacretia Leigh ([id number]) on 06/03/2022 1:59:08 PM  Radiology No results found.  Procedures Procedures    Medications Ordered in ED Medications  lactated ringers infusion (has no administration in time range)  acetaminophen (TYLENOL) suppository 975 mg (has no administration in time range)    ED Course/ Medical Decision Making/ A&P                           Medical  Decision Making Amount and/or Complexity of Data Reviewed Labs: ordered. Radiology: ordered. ECG/medicine tests: ordered.  Risk Prescription drug management.  Patient is EKG per my interpretation shows sinus tachycardia.  This is likely from the patient's temperature of 103 degrees. Patient medicated with Tylenol for increased temperature.  Chest x-ray per interpretation shows signs of pneumonia.  Patient started on IV antibiotics.  Patient has evidence of lactic acidosis likely from sepsis.  Mild hypokalemia noted with potassium 3.1.  Will replenish this with potassium both IV and oral.  Significant leukocytosis noted with white count 21,000.  Patient is on Xarelto and head CT was performed due to possible intracranial bleed.  Head CT per my interpretation showed no acute findings.  Patient reexamined after receiving Tylenol and her mental status is greatly improved.  Case discussed with husband at bedside.  Plan will be for patient to admit to the hospitalist service.  CRITICAL CARE Performed by: Leota Jacobsen Total critical care time: 45 minutes Critical care time was exclusive of separately billable procedures and treating other patients. Critical care was necessary to treat or prevent imminent or life-threatening deterioration. Critical care was time spent personally by me on the following activities: development of treatment plan with patient and/or surrogate as well as nursing, discussions with consultants, evaluation of patient's response to treatment, examination of patient, obtaining history from patient or surrogate, ordering and performing treatments and interventions, ordering and review of laboratory studies, ordering and review of radiographic studies, pulse oximetry and re-evaluation of patient's condition.         Final Clinical Impression(s) / ED Diagnoses Final diagnoses:  None    Rx / DC Orders ED Discharge Orders     None         Lacretia Leigh,  MD  06/03/22 1336    Lacretia Leigh, MD 06/03/22 1359

## 2022-06-03 NOTE — ED Triage Notes (Signed)
Pt BIBA from home for AMS, normally A&Ox4 and was that way yesterday, this morning, uncertain if she took granddaughter to school. Husband found on couch at 0830 alert mainly to voice. Denies urinary sx. Febrile on arrival. 20ga LAC  BP 163/96 HR 110 94% RA RR 26 CBG 212

## 2022-06-03 NOTE — Sepsis Progress Note (Signed)
Sepsis protocol monitored by eLink 

## 2022-06-04 ENCOUNTER — Encounter (HOSPITAL_COMMUNITY): Payer: Self-pay

## 2022-06-04 ENCOUNTER — Other Ambulatory Visit: Payer: Self-pay

## 2022-06-04 DIAGNOSIS — A419 Sepsis, unspecified organism: Secondary | ICD-10-CM | POA: Diagnosis not present

## 2022-06-04 DIAGNOSIS — E876 Hypokalemia: Secondary | ICD-10-CM | POA: Insufficient documentation

## 2022-06-04 LAB — GLUCOSE, CAPILLARY
Glucose-Capillary: 178 mg/dL — ABNORMAL HIGH (ref 70–99)
Glucose-Capillary: 210 mg/dL — ABNORMAL HIGH (ref 70–99)
Glucose-Capillary: 115 mg/dL — ABNORMAL HIGH (ref 70–99)
Glucose-Capillary: 152 mg/dL — ABNORMAL HIGH (ref 70–99)

## 2022-06-04 LAB — CBC
HCT: 35.8 % — ABNORMAL LOW (ref 36.0–46.0)
Hemoglobin: 12.4 g/dL (ref 12.0–15.0)
MCH: 31.6 pg (ref 26.0–34.0)
MCHC: 34.6 g/dL (ref 30.0–36.0)
MCV: 91.1 fL (ref 80.0–100.0)
Platelets: 288 10*3/uL (ref 150–400)
RBC: 3.93 MIL/uL (ref 3.87–5.11)
RDW: 13.2 % (ref 11.5–15.5)
WBC: 23.3 10*3/uL — ABNORMAL HIGH (ref 4.0–10.5)
nRBC: 0 % (ref 0.0–0.2)

## 2022-06-04 LAB — COMPREHENSIVE METABOLIC PANEL
ALT: 24 U/L (ref 0–44)
AST: 25 U/L (ref 15–41)
Albumin: 2.8 g/dL — ABNORMAL LOW (ref 3.5–5.0)
Alkaline Phosphatase: 54 U/L (ref 38–126)
Anion gap: 4 — ABNORMAL LOW (ref 5–15)
BUN: 7 mg/dL — ABNORMAL LOW (ref 8–23)
CO2: 29 mmol/L (ref 22–32)
Calcium: 8.4 mg/dL — ABNORMAL LOW (ref 8.9–10.3)
Chloride: 107 mmol/L (ref 98–111)
Creatinine, Ser: 0.62 mg/dL (ref 0.44–1.00)
GFR, Estimated: >60 mL/min (ref >60)
Glucose, Bld: 166 mg/dL — ABNORMAL HIGH (ref 70–99)
Potassium: 3.4 mmol/L — ABNORMAL LOW (ref 3.5–5.1)
Sodium: 140 mmol/L (ref 135–145)
Total Bilirubin: 0.9 mg/dL (ref 0.3–1.2)
Total Protein: 6.2 g/dL — ABNORMAL LOW (ref 6.5–8.1)

## 2022-06-04 LAB — URINE CULTURE: Culture: NO GROWTH

## 2022-06-04 LAB — HIV ANTIBODY (ROUTINE TESTING W REFLEX): HIV Screen 4th Generation wRfx: NONREACTIVE

## 2022-06-04 MED ORDER — AZITHROMYCIN 250 MG PO TABS
500.0000 mg | ORAL_TABLET | Freq: Every day | ORAL | Status: DC
  Administered 2022-06-05 – 2022-06-06 (×2): 500 mg via ORAL
  Filled 2022-06-04 (×2): qty 2

## 2022-06-04 MED ORDER — ORAL CARE MOUTH RINSE: 15.0000 mL | OROMUCOSAL | Status: DC | PRN

## 2022-06-04 NOTE — Hospital Course (Signed)
Alexandra [name] is a 61 y.o. female with medical history significant of seasonal allergies, anemia, schizoaffective disorder, bipolar disorder, type 2 diabetes, fatty liver disease, GERD, hiatal hernia, hyperlipidemia, hyperplastic colon polyp, hypertension, hypothyroidism, history of stroke, left-sided hemiparesis, history of neuropathy, right rotator cuff tear who is brought to the emergency department due to fever and altered mental status.   This morning her husband found her on the couch and called EMS.  She stated she has been coughing for the past 2 weeks.  She has had trouble expectorating phlegm.  She also has a sore throat for the past 2 days.  Her appetite is decreased.  No  wheezing or hemoptysis.  No chest pain, palpitations, diaphoresis, PND, orthopnea or pitting edema of the lower extremities.  No abdominal pain, diarrhea, constipation, melena or hematochezia.  No flank pain, dysuria, frequency or hematuria.  No polyuria, polydipsia, polyphagia or blurred vision.   ED course: Initial vital signs were temperature 103 F, pulse 117, respiration 30, BP 168/87 mmHg O2 sat 96% on room air.  The patient received 2500 mL of normal saline bolus, K-Lor 40 mEq p.o. x1, ceftriaxone and Rocephin IVPB   Lab work: Urinalysis was hazy with ketonuria 5 mg/dL, but otherwise normal.  Lactic acid was 2.2 mmol/L.  CBC showed a white count 21.7 with 84% neutrophils, hemoglobin 14.4 g/dL and platelets 327.  PT 13.5, INR 1.0 and PTT 28.  CMP with a potassium of 3.1 mmol/L, glucose 218 and calcium 8.7 mg/dL.   Imaging: Portable 1 view chest radiograph with a possible left retrocardiac pulmonary opacity.  Consider atelectasis, aspiration or infection.  CT head without contrast with no acute intracranial normality.   Assessment and Plan: Principal Problem:   Sepsis due to undetermined organism (HCC)/ Community-acquired pneumonia -Met sepsis criteria on admission -Improving sepsis physiology This Morning  vitals: BP 137/79, pulse 87, temp. 99.1 F (37.3 C), RR16, on RA SpO2 92 %.  WBC 23.3,  Lactic acid 2.2, 2.8, 1.9 this AM  -Was on supplemental oxygen on admission, has been weaned off to room air -Continue scheduled and as needed bronchodilators. Continue ceftriaxone 1 g IVPB daily, and azithromycin, was switched to p.o. Check strep pneumoniae urinary antigen. Pending -  sputum Gram stain, culture and sensitivity. Follow-up blood culture and sensitivity.     Active Problems:  Hypothyroidism Continue levothyroxine 112 mcg p.o. daily.     Mixed hyperlipidemia Continue simvastatin 20 mg p.o. daily.     Type 2 diabetes mellitus with complication,    with long-term current use of insulin (HCC)  Carbohydrate modified diet. Continue Lantus 20 units daily. Monitoring CBG q. ACHS, SSI coverage Continue duloxetine and gabapentin for neuropathy.     Essential hypertension Continue bisoprolol and hydrochlorothiazide.

## 2022-06-04 NOTE — Progress Notes (Signed)

## 2022-06-04 NOTE — Progress Notes (Addendum)
PROGRESS NOTE    Patient: Alexandra [name]                            PCP: Dorothyann Peng, NP                    DOB: 1961/07/20            DOA: 06/03/2022 WGY:[id number]             DOS: 06/04/2022, 12:37 PM   LOS: 1 day   Date of Service: The patient was seen and examined on 06/04/2022  Subjective:   The patient was seen and examined this morning. Hemodynamically stable Mentation much improved, awake alert oriented x3 this morning   Brief Narrative:    Alexandra [name] is a 60 y.o. female with medical history significant of seasonal allergies, anemia, schizoaffective disorder, bipolar disorder, type 2 diabetes, fatty liver disease, GERD, hiatal hernia, hyperlipidemia, hyperplastic colon polyp, hypertension, hypothyroidism, history of stroke, left-sided hemiparesis, history of neuropathy, right rotator cuff tear who is brought to the emergency department due to fever and altered mental status.   This morning her husband found her on the couch and called EMS.  She stated she has been coughing for the past 2 weeks.  She has had trouble expectorating phlegm.  She also has a sore throat for the past 2 days.  Her appetite is decreased.  No  wheezing or hemoptysis.  No chest pain, palpitations, diaphoresis, PND, orthopnea or pitting edema of the lower extremities.  No abdominal pain, diarrhea, constipation, melena or hematochezia.  No flank pain, dysuria, frequency or hematuria.  No polyuria, polydipsia, polyphagia or blurred vision.   ED course: Initial vital signs were temperature 103 F, pulse 117, respiration 30, BP 168/87 mmHg O2 sat 96% on room air.  The patient received 2500 mL of normal saline bolus, K-Lor 40 mEq p.o. x1, ceftriaxone and Rocephin IVPB   Lab work: Urinalysis was hazy with ketonuria 5 mg/dL, but otherwise normal.  Lactic acid was 2.2 mmol/L.  CBC showed a white count 21.7 with 84% neutrophils, hemoglobin 14.4 g/dL and platelets 327.  PT 13.5, INR 1.0 and PTT 28.   CMP with a potassium of 3.1 mmol/L, glucose 218 and calcium 8.7 mg/dL.   Imaging: Portable 1 view chest radiograph with a possible left retrocardiac pulmonary opacity.  Consider atelectasis, aspiration or infection.  CT head without contrast with no acute intracranial normality.      Assessment & Plan:    Assessment and Plan:   Principal Problem:   Sepsis due to undetermined organism [facility]) Active Problems:   Hypothyroidism   Mixed hyperlipidemia   Essential hypertension   Type 2 diabetes mellitus with complication, with long-term current use of insulin (HCC)    Principal Problem:   Sepsis due to undetermined organism (HCC)/ Community-acquired pneumonia -Met sepsis criteria on admission -Improving sepsis physiology This Morning vitals: BP 137/79, pulse 87, temp. 99.1 F (37.3 C), RR16, on RA SpO2 92 %.  WBC 23.3,  Lactic acid 2.2, 2.8, 1.9 this AM  -Was on supplemental oxygen on admission, has been weaned off to room air -Continue scheduled and as needed bronchodilators. Continue ceftriaxone 1 g IVPB daily, and azithromycin, was switched to p.o. Check strep pneumoniae urinary antigen. Pending -  sputum Gram stain, culture and sensitivity. Follow-up blood culture and sensitivity.     Active Problems:  Hypothyroidism Continue levothyroxine 112 mcg p.o. daily.  Mixed hyperlipidemia Continue simvastatin 20 mg p.o. daily.   Type 2 diabetes mellitus with complication,  with long-term current use of insulin (HCC)  Carbohydrate modified diet. Continue Lantus 20 units daily. Monitoring CBG q. ACHS, SSI coverage Continue duloxetine and gabapentin for neuropathy.      Essential hypertension Continue bisoprolol and hydrochlorothiazide.            ----------------------------------------------------------------------------------------------------------------------------------------------- Nutritional status:  The patient's BMI is: Body mass index is  31.24 kg/m. I agree with the assessment and plan as outlined  Skin Assessment: I have examined the patient's skin and I agree with the wound assessment as performed by wound care team As outlined belowe:     ---------------------------------------------------------------------------------------------------------------------------------------------------- Cultures; Blood Cultures x 2 >> NGT Sputum Culture >> need to be obtained    ----------------------------------------------------------------------------------------------------------------------------------------  DVT prophylaxis:  enoxaparin (LOVENOX) injection 40 mg Start: 06/03/22 2200   Code Status:   Code Status: Full Code  Family Communication: None at bedside-updated The above findings and plan of care has been discussed with patient (and family)  in detail,  they expressed understanding and agreement of above. -Advance care planning has been discussed.   Admission status:   Status is: Inpatient Remains inpatient appropriate because: Continued need for IV antibiotics, IV fluids     Procedures:   No admission procedures for hospital encounter.   Antimicrobials:  Anti-infectives (From admission, onward)    Start     Dose/Rate Route Frequency Ordered Stop   06/03/22 1215  cefTRIAXone (ROCEPHIN) 2 g in sodium chloride 0.9 % 100 mL IVPB        2 g 200 mL/hr over 30 Minutes Intravenous Every 24 hours 06/03/22 1207 06/08/22 1214   06/03/22 1215  azithromycin (ZITHROMAX) 500 mg in sodium chloride 0.9 % 250 mL IVPB        500 mg 250 mL/hr over 60 Minutes Intravenous Every 24 hours 06/03/22 1207 06/08/22 1214        Medication:   bisoprolol  10 mg Oral Daily   And   hydrochlorothiazide  6.25 mg Oral Daily   DULoxetine  30 mg Oral Daily   enoxaparin (LOVENOX) injection  40 mg Subcutaneous Q24H   ferrous sulfate  325 mg Oral Q breakfast   gabapentin  200 mg Oral TID   insulin aspart  0-15 Units Subcutaneous  TID WC   insulin glargine-yfgn  20 Units Subcutaneous Daily   levothyroxine  112 mcg Oral QAC breakfast   oxybutynin  5 mg Oral QHS   pantoprazole  40 mg Oral Daily   risperiDONE  1 mg Oral BID   simvastatin  20 mg Oral QHS   traZODone  200 mg Oral QHS    acetaminophen **OR** acetaminophen, ondansetron **OR** ondansetron (ZOFRAN) IV, mouth rinse   Objective:   Vitals:   06/03/22 2005 06/03/22 2257 06/04/22 0232 06/04/22 0508  BP: (!) 147/77  134/71 137/79  Pulse: 96 90 90 87  Resp: _0 Temp: 98.8 F (37.1 C) 98.5 F (36.9 C) (!) 97.5 F (36.4 C) 99.1 F (37.3 C)  TempSrc: Oral Oral Oral Oral  SpO2: 93% 93% 92% 92%  Weight:      Height:        Intake/Output Summary (Last 24 hours) at 06/04/2022 1237 Last data filed at 06/04/2022 0235 Gross per 24 hour  Intake 5574.14 ml  Output 2400 ml  Net 3174.14 ml   Filed Weights   06/03/22 1151  Weight: 75 kg  Examination:   Physical Exam  Constitution:  Alert, cooperative, no distress,  Appears calm and comfortable  Psychiatric:   Normal and stable mood and affect, cognition intact,   HEENT:        Normocephalic, PERRL, otherwise with in Normal limits  Chest:         Chest symmetric Cardio vascular:  S1/S2, RRR, No murmure, No Rubs or Gallops  pulmonary: Clear to auscultation bilaterally, respirations unlabored, negative wheezes / crackles Abdomen: Soft, non-tender, non-distended, bowel sounds,no masses, no organomegaly Muscular skeletal: Limited exam - in bed, able to move all 4 extremities,   Neuro: CNII-XII intact. , normal motor and sensation, reflexes intact  Extremities: No pitting edema lower extremities, +2 pulses  Skin: Dry, warm to touch, negative for any Rashes, No open wounds Wounds: per nursing documentation   ------------------------------------------------------------------------------------------------------------------------------------------    LABs:     Latest Ref Rng & Units  06/04/2022    4:15 AM 06/03/2022   11:58 AM 11/16/2021    3:34 AM  CBC  WBC 4.0 - 10.5 K/uL 23.3  21.7  9.7   Hemoglobin 12.0 - 15.0 g/dL 12.4  14.4  12.9   Hematocrit 36.0 - 46.0 % 35.8  40.7  37.2   Platelets 150 - 400 K/uL 288  327  298       Latest Ref Rng & Units 06/04/2022    4:15 AM 06/03/2022   11:58 AM 11/16/2021    3:34 AM  CMP  Glucose 70 - 99 mg/dL 166  218  215   BUN 8 - 23 mg/dL _0 Creatinine 0.44 - 1.00 mg/dL 0.62  0.67  0.62   Sodium 135 - 145 mmol/L 140  140  136   Potassium 3.5 - 5.1 mmol/L 3.4  3.1  3.9   Chloride 98 - 111 mmol/L 107  100  97   CO2 22 - 32 mmol/L 29  28  32   Calcium 8.9 - 10.3 mg/dL 8.4  8.7  8.4   Total Protein 6.5 - 8.1 g/dL 6.2  7.0  6.8   Total Bilirubin 0.3 - 1.2 mg/dL 0.9  1.1  0.5   Alkaline Phos 38 - 126 U/L 54  70  53   AST 15 - 41 U/L 25  36  38   ALT 0 - 44 U/L 24  30  32        Micro Results Recent Results (from the past 240 hour(s))  Blood Culture (routine x 2)     Status: None (Preliminary result)   Collection Time: 06/03/22 11:55 AM   Specimen: BLOOD LEFT FOREARM  Result Value Ref Range Status   Specimen Description   Final    BLOOD LEFT FOREARM Performed at [facility], [address], [place], [place] [address]    Special Requests   Final    BOTTLES DRAWN AEROBIC AND ANAEROBIC Blood Culture adequate volume Performed at [facility] [address] [place], [place] [address]    Culture   Final    NO GROWTH < 24 HOURS Performed at [facility] [address], [place], [place] [address]    Report Status PENDING  Incomplete  Blood Culture (routine x 2)     Status: None (Preliminary result)   Collection Time: 06/03/22 11:58 AM   Specimen: BLOOD RIGHT FOREARM  Result Value Ref Range Status   Specimen Description   Final    BLOOD  RIGHT FOREARM Performed at [facility], [place] [address], [place], [place] [address]    Special Requests   Final    BOTTLES DRAWN AEROBIC AND  ANAEROBIC Blood Culture results may not be optimal due to an excessive volume of blood received in culture bottles Performed at [facility] [address], [place], [place] [address]    Culture   Final    NO GROWTH < 12 HOURS Performed at [facility] [address] [place], [place] [address]    Report Status PENDING  Incomplete  Urine Culture     Status: None   Collection Time: 06/03/22  1:04 PM   Specimen: In/Out Cath Urine  Result Value Ref Range Status   Specimen Description   Final    IN/OUT CATH URINE Performed at [facility] [address], [place], [place] [address]    Special Requests   Final    NONE Performed at [facility], [place] [address] [place], [place] [address]    Culture   Final    NO GROWTH Performed at [facility], [place] [address] [place], [place] [address]    Report Status 06/04/2022 FINAL  Final  Resp Panel by RT-PCR (Flu A&B, Covid) Urine, Clean Catch     Status: None   Collection Time: 06/03/22  1:04 PM   Specimen: Urine, Clean Catch; Nasal Swab  Result Value Ref Range Status   SARS Coronavirus 2 by RT PCR NEGATIVE NEGATIVE Final    Comment: (NOTE) SARS-CoV-2 target nucleic acids are NOT DETECTED.  The SARS-CoV-2 RNA is generally detectable in upper respiratory specimens during the acute phase of infection. The lowest concentration of SARS-CoV-2 viral copies this assay can detect is 138 copies/mL. A negative result does not preclude SARS-Cov-2 infection and should not be used as the sole basis for treatment or other patient management decisions. A negative result may occur with  improper specimen collection/handling, submission of specimen other than nasopharyngeal swab, presence of viral mutation(s) within the areas targeted by this assay, and inadequate number of viral copies(<138 copies/mL). A negative result must be combined with clinical observations, patient history,  and epidemiological information. The expected result is Negative.  Fact Sheet for Patients:  EntrepreneurPulse.com.au  Fact Sheet for Healthcare Providers:  IncredibleEmployment.be  This test is no t yet approved or cleared by the [place] FDA and  has been authorized for detection and/or diagnosis of SARS-CoV-2 by FDA under an Emergency Use Authorization (EUA). This EUA will remain  in effect (meaning this test can be used) for the duration of the COVID-19 declaration under Section 564(b)(1) of the Act, 21 U.S.C.section 360bbb-3(b)(1), unless the authorization is terminated  or revoked sooner.       Influenza A by PCR NEGATIVE NEGATIVE Final   Influenza B by PCR NEGATIVE NEGATIVE Final    Comment: (NOTE) The Xpert Xpress SARS-CoV-2/FLU/RSV plus assay is intended as an aid in the diagnosis of influenza from Nasopharyngeal swab specimens and should not be used as a sole basis for treatment. Nasal washings and aspirates are unacceptable for Xpert Xpress SARS-CoV-2/FLU/RSV testing.  Fact Sheet for Patients: EntrepreneurPulse.com.au  Fact Sheet for Healthcare Providers: IncredibleEmployment.be  This test is not yet approved or cleared by the [place] FDA and has been authorized for detection and/or diagnosis of SARS-CoV-2 by FDA under an Emergency Use Authorization (EUA). This EUA will remain in effect (meaning this test can be used) for the  duration of the COVID-19 declaration under Section 564(b)(1) of the Act, 21 U.S.C. section 360bbb-3(b)(1), unless the authorization is terminated or revoked.  Performed at [facility], [place] [address], [place], [place] [address]     Radiology Reports No results found.  SIGNED: Deatra James, MD, FHM. [facility],  Pager (please use [email or web address] to page/text) Please use Epic Secure Chat for non-urgent communication  (7AM-7PM)  If 7PM-7AM, please contact night-coverage [email or web address], 06/04/2022, 12:37 PM

## 2022-06-04 NOTE — Progress Notes (Signed)
Two warm blankets provided to patient at this time per request.

## 2022-06-04 NOTE — TOC Initial Note (Signed)
Transition of Care [facility]) - Initial/Assessment Note    Patient Details  Name: Alexandra [name] MRN: [id number] Date of Birth: Mar 28, 1961  Transition of Care [facility]) CM/SW Contact:    Leeroy Cha, RN Phone Number: 06/04/2022, 8:22 AM  Clinical Narrative:                  Transition of Care [facility]) Screening Note   Patient Details  Name: Alexandra [name] Date of Birth: Jun 02, 1961   Transition of Care [facility]) CM/SW Contact:    Leeroy Cha, RN Phone Number: 06/04/2022, 8:22 AM    Transition of Care Department [facility]) has reviewed patient and no TOC needs have been identified at this time. We will continue to monitor patient advancement through interdisciplinary progression rounds. If new patient transition needs arise, please place a TOC consult.    Expected Discharge Plan: Home/Self Care Barriers to Discharge: Continued Medical Work up   Patient Goals and CMS Choice Patient states their goals for this hospitalization and ongoing recovery are:: to go home and be well CMS Medicare.gov Compare Post Acute Care list provided to:: Patient    Expected Discharge Plan and Services Expected Discharge Plan: Home/Self Care   Discharge Planning Services: CM Consult   Living arrangements for the past 2 months: Single Family Home                                      Prior Living Arrangements/Services Living arrangements for the past 2 months: Single Family Home Lives with:: Spouse Patient language and need for interpreter reviewed:: Yes Do you feel safe going back to the place where you live?: Yes            Criminal Activity/Legal Involvement Pertinent to Current Situation/Hospitalization: No - Comment as needed  Activities of Daily Living Home Assistive Devices/Equipment: Eyeglasses ADL Screening (condition at time of admission) Patient's cognitive ability adequate to safely complete daily activities?: No Is the patient deaf or have difficulty  hearing?: No Does the patient have difficulty seeing, even when wearing glasses/contacts?: No Does the patient have difficulty concentrating, remembering, or making decisions?: Yes Patient able to express need for assistance with ADLs?: Yes Does the patient have difficulty dressing or bathing?: Yes Independently performs ADLs?: No Communication: Independent Dressing (OT): Needs assistance Is this a change from baseline?: Change from baseline, expected to last >3 days Grooming: Needs assistance Is this a change from baseline?: Change from baseline, expected to last >3 days Feeding: Needs assistance Is this a change from baseline?: Change from baseline, expected to last >3 days Bathing: Needs assistance Is this a change from baseline?: Change from baseline, expected to last >3 days Toileting: Needs assistance Is this a change from baseline?: Change from baseline, expected to last >3days In/Out Bed: Needs assistance Is this a change from baseline?: Change from baseline, expected to last >3 days Walks in Home: Needs assistance Is this a change from baseline?: Change from baseline, expected to last >3 days Does the patient have difficulty walking or climbing stairs?: Yes Weakness of Legs: Both Weakness of Arms/Hands: Both  Permission Sought/Granted                  Emotional Assessment Appearance:: Appears stated age Attitude/Demeanor/Rapport: Engaged Affect (typically observed): Calm Orientation: : Oriented to Self, Oriented to Place, Oriented to  Time, Oriented to Situation Alcohol / Substance Use: Alcohol Use, Tobacco Use (  former smoker current etoh use) Psych Involvement: No (comment)  Admission diagnosis:  Sepsis due to undetermined organism ([place]) [A41.9] Altered mental status, unspecified altered mental status type [R41.82] Patient Active Problem List   Diagnosis Date Noted   Sepsis due to undetermined organism ([place]) 06/03/2022   Type 2 diabetes mellitus with  complication, with long-term current use of insulin ([facility]) 06/03/2022   S/P total knee arthroplasty, right 11/13/2021   Neuropathic pain 04/15/2019   Lumbar radiculopathy, right 04/15/2019   Piriformis syndrome of right side 04/15/2019   Essential hypertension 09/19/2015   Routine general medical examination at a health care facility 03/16/2015   Schizoaffective disorder-depressed type 04/21/2013   Bipolar I disorder ([place]) 03/15/2013   Hemiplegia, unspecified, affecting dominant side 01/16/2013   TIA (transient ischemic attack) 01/16/2013   Diabetes ([facility]) 01/16/2013   NEUROSIS,NOS 07/18/2009   NECK PAIN 07/18/2009   GASTRITIS 02/16/2009   BARIATRIC SURGERY STATUS 06/01/2008   HIDRADENITIS SUPPURATIVA 10/28/2007   Mixed hyperlipidemia 06/16/2007   OTHER SPECIFIED ANEMIAS 06/16/2007   HIP PAIN, RIGHT 06/16/2007   Hypothyroidism 02/16/2007   PCP:  Dorothyann Peng, NP Pharmacy:   [facility] [place] [address] Phone: [phone] Fax: [phone]  [id number] [place] [id number]: [phone]: [phone]    Social Determinants of Health (SDOH) Interventions    Readmission Risk Interventions   No data to display

## 2022-06-04 NOTE — Progress Notes (Signed)
Initial Nutrition Assessment  DOCUMENTATION CODES:   Obesity unspecified  INTERVENTION:  -Liberalize diet to carb modified diet to optimize intake -Reinforce diet recommendations as needed  NUTRITION DIAGNOSIS:  Predicted suboptimal nutrient intake related to other (see comment) (limited food acceptance due to restrictive diet) as evidenced by other (comment) (decreased po intake during admission.).  GOAL:  Patient will meet greater than or equal to 90% of their needs  MONITOR:  PO intake  REASON FOR ASSESSMENT:  Malnutrition Screening Tool    ASSESSMENT:  Alexandra [name] is a 61yo F with PMH of seasonal allergies, anemia, schizoaffective disorder, bipolar disorder, T2DM, fatty liver disease, GERD, hiatal hernia, HLD, hyperplastic colon polyp,HTN, hypothyroidism, stroke w/ left-sided hemiparesis, neuropathy, and right rotator cuff tear who presents with fever and AMS.  Visited Alexandra [name] and husband this AM. Alexandra [name] reports decreased po intake due to restrictive diet during admission. PTA Alexandra [name] was eating normally. Alexandra [name] with increased nutrient needs due to sepsis, recommend liberalizing diet to carb controlled only to optimize po intake. Alexandra [name] denies recent significant weight loss and EMR confirms. Alexandra [name] reports monitoring BG closely with CGM at home. Reports watching diet. Has no nutrition related questions or concerns at this time. No s/s of malnutrition at this time.  Medications reviewed and include: ferrous sulfate, novolog, semglee, synthroid, protonix, zocor, azithromycin, rocephin, prn zofran  Labs reviewed: K:3.4, BG:166, BUN:7, A1c:7.3  Weight History: 06/03/22 75 kg  05/03/22 75.8 kg  03/08/22 75.8 kg  02/01/22 75.8 kg  01/16/22 75.8 kg  11/13/21 82.1 kg  Unintended but not significant 8.6% weight loss in 7 months.  NUTRITION - FOCUSED PHYSICAL EXAM:  Flowsheet Row Most Recent Value  Orbital Region No depletion  Upper Arm Region No depletion  Thoracic and Lumbar Region No depletion  Buccal  Region No depletion  Temple Region No depletion  Clavicle Bone Region No depletion  Clavicle and Acromion Bone Region No depletion  Scapular Bone Region No depletion  Dorsal Hand No depletion  Patellar Region No depletion  Anterior Thigh Region No depletion  Posterior Calf Region No depletion  Hair Reviewed  Eyes Reviewed  Mouth Reviewed  Skin Reviewed  Nails Reviewed       Diet Order:   Diet Order             Diet Carb Modified Fluid consistency: Thin; Room service appropriate? Yes  Diet effective now                   EDUCATION NEEDS:   Not appropriate for education at this time  Skin:  Skin Assessment: Reviewed RN Assessment  Last BM:  11/7  Height:  Ht Readings from Last 1 Encounters:  06/03/22 '5\' 1"'$  (1.549 m)   Weight:  Wt Readings from Last 1 Encounters:  06/03/22 75 kg    BMI:  Body mass index is 31.24 kg/m.  Estimated Nutritional Needs:  Kcal:  1875-2250kcal Protein:  90-115g Fluid:  >1832m  KCandise Bowens MS, RD, LDN, CNSC See AMiON for contact information

## 2022-06-05 LAB — GLUCOSE, CAPILLARY
Glucose-Capillary: 145 mg/dL — ABNORMAL HIGH (ref 70–99)
Glucose-Capillary: 221 mg/dL — ABNORMAL HIGH (ref 70–99)
Glucose-Capillary: 98 mg/dL (ref 70–99)

## 2022-06-05 LAB — BASIC METABOLIC PANEL
Anion gap: 7 (ref 5–15)
BUN: 5 mg/dL — ABNORMAL LOW (ref 8–23)
CO2: 30 mmol/L (ref 22–32)
Calcium: 8.7 mg/dL — ABNORMAL LOW (ref 8.9–10.3)
Chloride: 104 mmol/L (ref 98–111)
Creatinine, Ser: 0.46 mg/dL (ref 0.44–1.00)
GFR, Estimated: >60 mL/min (ref >60)
Glucose, Bld: 152 mg/dL — ABNORMAL HIGH (ref 70–99)
Potassium: 3.1 mmol/L — ABNORMAL LOW (ref 3.5–5.1)
Sodium: 141 mmol/L (ref 135–145)

## 2022-06-05 LAB — CBC
HCT: 34 % — ABNORMAL LOW (ref 36.0–46.0)
Hemoglobin: 11.9 g/dL — ABNORMAL LOW (ref 12.0–15.0)
MCH: 32 pg (ref 26.0–34.0)
MCHC: 35 g/dL (ref 30.0–36.0)
MCV: 91.4 fL (ref 80.0–100.0)
Platelets: 273 10*3/uL (ref 150–400)
RBC: 3.72 MIL/uL — ABNORMAL LOW (ref 3.87–5.11)
RDW: 13.2 % (ref 11.5–15.5)
WBC: 15.5 10*3/uL — ABNORMAL HIGH (ref 4.0–10.5)
nRBC: 0 % (ref 0.0–0.2)

## 2022-06-05 LAB — PROCALCITONIN: Procalcitonin: 1.58 ng/mL

## 2022-06-05 MED ORDER — DM-GUAIFENESIN ER 30-600 MG PO TB12
1.0000 | ORAL_TABLET | Freq: Two times a day (BID) | ORAL | Status: DC
  Administered 2022-06-05 – 2022-06-06 (×2): 1 via ORAL
  Filled 2022-06-05 (×2): qty 1

## 2022-06-05 MED ORDER — POTASSIUM CHLORIDE 20 MEQ PO PACK
40.0000 meq | PACK | Freq: Two times a day (BID) | ORAL | Status: DC
  Administered 2022-06-05 – 2022-06-06 (×3): 40 meq via ORAL
  Filled 2022-06-05 (×3): qty 2

## 2022-06-05 MED ORDER — PHENOL 1.4 % MT LIQD
1.0000 | OROMUCOSAL | Status: DC | PRN
  Administered 2022-06-05: 1 via OROMUCOSAL
  Filled 2022-06-05: qty 177

## 2022-06-05 MED ORDER — GUAIFENESIN-DM 100-10 MG/5ML PO SYRP
5.0000 mL | ORAL_SOLUTION | ORAL | Status: DC | PRN
  Administered 2022-06-05 – 2022-06-06 (×2): 5 mL via ORAL
  Filled 2022-06-05 (×2): qty 10

## 2022-06-05 NOTE — Plan of Care (Signed)
  Problem: Education: Goal: Knowledge of General Education information will improve Description Including pain rating scale, medication(s)/side effects and non-pharmacologic comfort measures Outcome: Progressing   Problem: Health Behavior/Discharge Planning: Goal: Ability to manage health-related needs will improve Outcome: Progressing   

## 2022-06-05 NOTE — Progress Notes (Signed)
Consultation Progress Note   Patient: Alexandra [name] QVZ:[id number] DOB: [birth year] DOA: 06/03/2022 DOS: the patient was seen and examined on 06/05/2022 Primary service: Carmin Dibartolo, Elgie Collard, MD  Brief hospital course:  Alexandra [name] is a 61 y.o. female with medical history significant of seasonal allergies, anemia, schizoaffective disorder, bipolar disorder, type 2 diabetes, fatty liver disease, GERD, hiatal hernia, hyperlipidemia, hyperplastic colon polyp, hypertension, hypothyroidism, history of stroke, left-sided hemiparesis, history of neuropathy, right rotator cuff tear who is brought to the emergency department due to fever and altered mental status.   This morning her husband found her on the couch and called EMS.  She stated she has been coughing for the past 2 weeks.  She has had trouble expectorating phlegm.  She also has a sore throat for the past 2 days.  Her appetite is decreased.  No  wheezing or hemoptysis.  No chest pain, palpitations, diaphoresis, PND, orthopnea or pitting edema of the lower extremities.  No abdominal pain, diarrhea, constipation, melena or hematochezia.  No flank pain, dysuria, frequency or hematuria.  No polyuria, polydipsia, polyphagia or blurred vision.   ED course: Initial vital signs were temperature 103 F, pulse 117, respiration 30, BP 168/87 mmHg O2 sat 96% on room air.  The patient received 2500 mL of normal saline bolus, K-Lor 40 mEq p.o. x1, ceftriaxone and Rocephin IVPB   Lab work: Urinalysis was hazy with ketonuria 5 mg/dL, but otherwise normal.  Lactic acid was 2.2 mmol/L.  CBC showed a white count 21.7 with 84% neutrophils, hemoglobin 14.4 g/dL and platelets 329.  PT 13.5, INR 1.0 and PTT 28.  CMP with a potassium of 3.1 mmol/L, glucose 218 and calcium 8.7 mg/dL.   Imaging: Portable 1 view chest radiograph with a possible left retrocardiac pulmonary opacity.  Consider atelectasis, aspiration or infection.  CT head without contrast with no acute  intracranial normality.   Assessment and Plan: Principal Problem:   Sepsis due to undetermined organism (HCC)/ Community-acquired pneumonia -Met sepsis criteria on admission -Improving sepsis physiology This Morning vitals: BP 137/79, pulse 87, temp. 99.1 F (37.3 C), RR16, on RA SpO2 92 %.  WBC 23.3,  Lactic acid 2.2, 2.8, 1.9 this AM  -Was on supplemental oxygen on admission, has been weaned off to room air -Continue scheduled and as needed bronchodilators. Continue ceftriaxone 1 g IVPB daily, and azithromycin, was switched to p.o. Check strep pneumoniae urinary antigen. Pending -  sputum Gram stain, culture and sensitivity. Follow-up blood culture and sensitivity.     Active Problems:  Hypothyroidism Continue levothyroxine 112 mcg p.o. daily.     Mixed hyperlipidemia Continue simvastatin 20 mg p.o. daily.     Type 2 diabetes mellitus with complication,    with long-term current use of insulin (HCC)  Carbohydrate modified diet. Continue Lantus 20 units daily. Monitoring CBG q. ACHS, SSI coverage Continue duloxetine and gabapentin for neuropathy.     Essential hypertension Continue bisoprolol and hydrochlorothiazide.          TRH will continue to follow the patient.  Subjective: Patient feels better, she reports cough productive of yellowish sputum.  WBC count remains elevated at 15 Plan to discharge tomorrow with anticipated decrease in WBC count.  Physical Exam: Constitutional:      General: She is awake. She is not in acute distress.    Appearance: Normal appearance. She is obese HENT:     Head: Normocephalic.     Nose: No rhinorrhea.     Mouth/Throat:  Mouth: Mucous membranes are moist Eyes:     General: No scleral icterus.    Pupils: Pupils are equal, round, and reactive to light.  Neck:     Vascular: No JVD.  Cardiovascular:     Rate and Rhythm: Regular rhythm.     Heart sounds: S1 normal and S2 normal.  Pulmonary:     Effort:  Pulmonary effort is normal.     Breath sounds: Rhonchi present. No wheezing or rales.  Abdominal:     General: Bowel sounds are normal. There is no distension.     Palpations: Abdomen is soft.     Tenderness: There is no abdominal tenderness. There is no guarding.  Musculoskeletal:     Cervical back: Neck supple.     Right lower leg: No edema.     Left lower leg: No edema.  Skin:    General: Skin is warm and dry.  Neurological:     General: No focal deficit present.     Mental Status: She is alert and oriented to person, place, and time.  Psychiatric:        Mood and Affect: Mood normal.        Behavior: Behavior normal. Behavior is cooperative. Vitals:   06/04/22 1406 06/04/22 1944 06/05/22 0634 06/05/22 1336  BP: 127/70 (!) 146/80 (!) 152/79 (!) 141/81  Pulse: 75 74 76 82  Resp: 18 17 17  (!) 24  Temp: 98 F (36.7 C) 97.6 F (36.4 C) 98.7 F (37.1 C) 98.7 F (37.1 C)  TempSrc: Oral Oral Oral Oral  SpO2: 94% 97% 97% 97%  Weight:      Height:        Data Reviewed:  There are no new results to review at this time.  Family Communication:   Time spent: 15 minutes.  Author: Baldomero Lamy, MD 06/05/2022 2:21 PM  For on call review [email or web address].

## 2022-06-05 NOTE — Progress Notes (Signed)
Mobility Specialist - Progress Note   06/05/22 0938  Mobility  Activity Ambulated independently in hallway  Level of Assistance Independent  Assistive Device None  Distance Ambulated (ft) 350 ft  Activity Response Tolerated well  Mobility Referral Yes  $Mobility charge 1 Mobility   Pt received in bed and agreeable to mobility. No complaints during mobility. Pt to EOB after session with all needs met & family in room.   [facility]

## 2022-06-06 LAB — GLUCOSE, CAPILLARY
Glucose-Capillary: 221 mg/dL — ABNORMAL HIGH (ref 70–99)
Glucose-Capillary: 213 mg/dL — ABNORMAL HIGH (ref 70–99)

## 2022-06-06 LAB — CBC
HCT: 36.6 % (ref 36.0–46.0)
Hemoglobin: 12.5 g/dL (ref 12.0–15.0)
MCH: 31.4 pg (ref 26.0–34.0)
MCHC: 34.2 g/dL (ref 30.0–36.0)
MCV: 92 fL (ref 80.0–100.0)
Platelets: 320 10*3/uL (ref 150–400)
RBC: 3.98 MIL/uL (ref 3.87–5.11)
RDW: 12.8 % (ref 11.5–15.5)
WBC: 13 10*3/uL — ABNORMAL HIGH (ref 4.0–10.5)
nRBC: 0 % (ref 0.0–0.2)

## 2022-06-06 LAB — BASIC METABOLIC PANEL
Anion gap: 10 (ref 5–15)
BUN: 7 mg/dL — ABNORMAL LOW (ref 8–23)
CO2: 30 mmol/L (ref 22–32)
Calcium: 9 mg/dL (ref 8.9–10.3)
Chloride: 104 mmol/L (ref 98–111)
Creatinine, Ser: 0.59 mg/dL (ref 0.44–1.00)
GFR, Estimated: >60 mL/min (ref >60)
Glucose, Bld: 147 mg/dL — ABNORMAL HIGH (ref 70–99)
Potassium: 3.5 mmol/L (ref 3.5–5.1)
Sodium: 144 mmol/L (ref 135–145)

## 2022-06-06 LAB — PROCALCITONIN: Procalcitonin: 0.82 ng/mL

## 2022-06-06 MED ORDER — AMOXICILLIN-POT CLAVULANATE 500-125 MG PO TABS: 1.0000 | ORAL_TABLET | Freq: Three times a day (TID) | ORAL | 0 refills | Status: DC

## 2022-06-06 NOTE — Discharge Summary (Signed)
Physician Discharge Summary   Patient: Alexandra [name] MRN: [id number] DOB: 1960/08/30  Admit date:     06/03/2022  Discharge date: 06/06/22  Discharge Physician: Manfred Shirts    PCP: Dorothyann Peng, NP   Recommendations at discharge:    Follow up with PCP  Discharge Diagnoses: Principal Problem:   Sepsis due to undetermined organism [facility]) Active Problems:   Hypokalemia   Hypothyroidism   Mixed hyperlipidemia   Essential hypertension   Type 2 diabetes mellitus with complication, with long-term current use of insulin (HCC)  Resolved Problems:   * No resolved hospital problems. *  Hospital Course:  Alexandra [name] is a 61 y.o. female with medical history significant of seasonal allergies, anemia, schizoaffective disorder, bipolar disorder, type 2 diabetes, fatty liver disease, GERD, hiatal hernia, hyperlipidemia, hyperplastic colon polyp, hypertension, hypothyroidism, history of stroke, left-sided hemiparesis, history of neuropathy, right rotator cuff tear who is brought to the emergency department due to fever and altered mental status.   This morning her husband found her on the couch and called EMS.  She stated she has been coughing for the past 2 weeks.  She has had trouble expectorating phlegm.  She also has a sore throat for the past 2 days.  Her appetite is decreased.  No  wheezing or hemoptysis.  No chest pain, palpitations, diaphoresis, PND, orthopnea or pitting edema of the lower extremities.  No abdominal pain, diarrhea, constipation, melena or hematochezia.  No flank pain, dysuria, frequency or hematuria.  No polyuria, polydipsia, polyphagia or blurred vision.   ED course: Initial vital signs were temperature 103 F, pulse 117, respiration 30, BP 168/87 mmHg O2 sat 96% on room air.  The patient received 2500 mL of normal saline bolus, K-Lor 40 mEq p.o. x1, ceftriaxone and Rocephin IVPB   Lab work: Urinalysis was hazy with ketonuria 5 mg/dL, but otherwise normal.   Lactic acid was 2.2 mmol/L.  CBC showed a white count 21.7 with 84% neutrophils, hemoglobin 14.4 g/dL and platelets 327.  PT 13.5, INR 1.0 and PTT 28.  CMP with a potassium of 3.1 mmol/L, glucose 218 and calcium 8.7 mg/dL.   Imaging: Portable 1 view chest radiograph with a possible left retrocardiac pulmonary opacity.  Consider atelectasis, aspiration or infection.  CT head without contrast with no acute intracranial normality.   Assessment and Plan: Principal Problem: Sepsis due to undetermined organism (HCC)/ Community-acquired pneumonia -Met sepsis criteria on admission -Improving sepsis physiology This Morning vitals: BP 137/79, pulse 87, temp. 99.1 F (37.3 C), RR16, on RA SpO2 92 %.  WBC improved from  23.3 to 13   -Was on supplemental oxygen on admission, has been weaned off to room air -Continue scheduled and as needed bronchodilators. Managed with ceftriaxone 1 g IVPB daily, and azithromycin, was switched to p.o. strep pneumoniae urinary antigen-negative. Negative sputum Gram stain, culture and sensitivity. Negative blood culture and sensitivity.  On DC switch to Augmentin 581m tid for 5 days.  Active Problems:  Hypothyroidism Continue levothyroxine 112 mcg p.o. daily.     Mixed hyperlipidemia Continue simvastatin 20 mg p.o. daily.     Type 2 diabetes mellitus with complication,    with long-term current use of insulin (HCC)  Carbohydrate modified diet. Continue Lantus 20 units daily. Monitoring CBG q. ACHS, SSI coverage Continue duloxetine and gabapentin for neuropathy.   Essential hypertension Continue bisoprolol and hydrochlorothiazide.            Consultants: None Procedures performed: None  Disposition: Home Diet  Physician Discharge Summary   Patient: Alexandra [name] MRN: [id number] DOB: 1960/08/30  Admit date:     06/03/2022  Discharge date: 06/06/22  Discharge Physician: Manfred Shirts    PCP: Dorothyann Peng, NP   Recommendations at discharge:    Follow up with PCP  Discharge Diagnoses: Principal Problem:   Sepsis due to undetermined organism [facility]) Active Problems:   Hypokalemia   Hypothyroidism   Mixed hyperlipidemia   Essential hypertension   Type 2 diabetes mellitus with complication, with long-term current use of insulin (HCC)  Resolved Problems:   * No resolved hospital problems. *  Hospital Course:  Alexandra [name] is a 61 y.o. female with medical history significant of seasonal allergies, anemia, schizoaffective disorder, bipolar disorder, type 2 diabetes, fatty liver disease, GERD, hiatal hernia, hyperlipidemia, hyperplastic colon polyp, hypertension, hypothyroidism, history of stroke, left-sided hemiparesis, history of neuropathy, right rotator cuff tear who is brought to the emergency department due to fever and altered mental status.   This morning her husband found her on the couch and called EMS.  She stated she has been coughing for the past 2 weeks.  She has had trouble expectorating phlegm.  She also has a sore throat for the past 2 days.  Her appetite is decreased.  No  wheezing or hemoptysis.  No chest pain, palpitations, diaphoresis, PND, orthopnea or pitting edema of the lower extremities.  No abdominal pain, diarrhea, constipation, melena or hematochezia.  No flank pain, dysuria, frequency or hematuria.  No polyuria, polydipsia, polyphagia or blurred vision.   ED course: Initial vital signs were temperature 103 F, pulse 117, respiration 30, BP 168/87 mmHg O2 sat 96% on room air.  The patient received 2500 mL of normal saline bolus, K-Lor 40 mEq p.o. x1, ceftriaxone and Rocephin IVPB   Lab work: Urinalysis was hazy with ketonuria 5 mg/dL, but otherwise normal.   Lactic acid was 2.2 mmol/L.  CBC showed a white count 21.7 with 84% neutrophils, hemoglobin 14.4 g/dL and platelets 327.  PT 13.5, INR 1.0 and PTT 28.  CMP with a potassium of 3.1 mmol/L, glucose 218 and calcium 8.7 mg/dL.   Imaging: Portable 1 view chest radiograph with a possible left retrocardiac pulmonary opacity.  Consider atelectasis, aspiration or infection.  CT head without contrast with no acute intracranial normality.   Assessment and Plan: Principal Problem: Sepsis due to undetermined organism (HCC)/ Community-acquired pneumonia -Met sepsis criteria on admission -Improving sepsis physiology This Morning vitals: BP 137/79, pulse 87, temp. 99.1 F (37.3 C), RR16, on RA SpO2 92 %.  WBC improved from  23.3 to 13   -Was on supplemental oxygen on admission, has been weaned off to room air -Continue scheduled and as needed bronchodilators. Managed with ceftriaxone 1 g IVPB daily, and azithromycin, was switched to p.o. strep pneumoniae urinary antigen-negative. Negative sputum Gram stain, culture and sensitivity. Negative blood culture and sensitivity.  On DC switch to Augmentin 581m tid for 5 days.  Active Problems:  Hypothyroidism Continue levothyroxine 112 mcg p.o. daily.     Mixed hyperlipidemia Continue simvastatin 20 mg p.o. daily.     Type 2 diabetes mellitus with complication,    with long-term current use of insulin (HCC)  Carbohydrate modified diet. Continue Lantus 20 units daily. Monitoring CBG q. ACHS, SSI coverage Continue duloxetine and gabapentin for neuropathy.   Essential hypertension Continue bisoprolol and hydrochlorothiazide.            Consultants: None Procedures performed: None  Disposition: Home Diet  Physician Discharge Summary   Patient: Alexandra [name] MRN: [id number] DOB: 1960/08/30  Admit date:     06/03/2022  Discharge date: 06/06/22  Discharge Physician: Manfred Shirts    PCP: Dorothyann Peng, NP   Recommendations at discharge:    Follow up with PCP  Discharge Diagnoses: Principal Problem:   Sepsis due to undetermined organism [facility]) Active Problems:   Hypokalemia   Hypothyroidism   Mixed hyperlipidemia   Essential hypertension   Type 2 diabetes mellitus with complication, with long-term current use of insulin (HCC)  Resolved Problems:   * No resolved hospital problems. *  Hospital Course:  Alexandra [name] is a 61 y.o. female with medical history significant of seasonal allergies, anemia, schizoaffective disorder, bipolar disorder, type 2 diabetes, fatty liver disease, GERD, hiatal hernia, hyperlipidemia, hyperplastic colon polyp, hypertension, hypothyroidism, history of stroke, left-sided hemiparesis, history of neuropathy, right rotator cuff tear who is brought to the emergency department due to fever and altered mental status.   This morning her husband found her on the couch and called EMS.  She stated she has been coughing for the past 2 weeks.  She has had trouble expectorating phlegm.  She also has a sore throat for the past 2 days.  Her appetite is decreased.  No  wheezing or hemoptysis.  No chest pain, palpitations, diaphoresis, PND, orthopnea or pitting edema of the lower extremities.  No abdominal pain, diarrhea, constipation, melena or hematochezia.  No flank pain, dysuria, frequency or hematuria.  No polyuria, polydipsia, polyphagia or blurred vision.   ED course: Initial vital signs were temperature 103 F, pulse 117, respiration 30, BP 168/87 mmHg O2 sat 96% on room air.  The patient received 2500 mL of normal saline bolus, K-Lor 40 mEq p.o. x1, ceftriaxone and Rocephin IVPB   Lab work: Urinalysis was hazy with ketonuria 5 mg/dL, but otherwise normal.   Lactic acid was 2.2 mmol/L.  CBC showed a white count 21.7 with 84% neutrophils, hemoglobin 14.4 g/dL and platelets 327.  PT 13.5, INR 1.0 and PTT 28.  CMP with a potassium of 3.1 mmol/L, glucose 218 and calcium 8.7 mg/dL.   Imaging: Portable 1 view chest radiograph with a possible left retrocardiac pulmonary opacity.  Consider atelectasis, aspiration or infection.  CT head without contrast with no acute intracranial normality.   Assessment and Plan: Principal Problem: Sepsis due to undetermined organism (HCC)/ Community-acquired pneumonia -Met sepsis criteria on admission -Improving sepsis physiology This Morning vitals: BP 137/79, pulse 87, temp. 99.1 F (37.3 C), RR16, on RA SpO2 92 %.  WBC improved from  23.3 to 13   -Was on supplemental oxygen on admission, has been weaned off to room air -Continue scheduled and as needed bronchodilators. Managed with ceftriaxone 1 g IVPB daily, and azithromycin, was switched to p.o. strep pneumoniae urinary antigen-negative. Negative sputum Gram stain, culture and sensitivity. Negative blood culture and sensitivity.  On DC switch to Augmentin 581m tid for 5 days.  Active Problems:  Hypothyroidism Continue levothyroxine 112 mcg p.o. daily.     Mixed hyperlipidemia Continue simvastatin 20 mg p.o. daily.     Type 2 diabetes mellitus with complication,    with long-term current use of insulin (HCC)  Carbohydrate modified diet. Continue Lantus 20 units daily. Monitoring CBG q. ACHS, SSI coverage Continue duloxetine and gabapentin for neuropathy.   Essential hypertension Continue bisoprolol and hydrochlorothiazide.            Consultants: None Procedures performed: None  Disposition: Home Diet  Physician Discharge Summary   Patient: Alexandra [name] MRN: [id number] DOB: 1960/08/30  Admit date:     06/03/2022  Discharge date: 06/06/22  Discharge Physician: Manfred Shirts    PCP: Dorothyann Peng, NP   Recommendations at discharge:    Follow up with PCP  Discharge Diagnoses: Principal Problem:   Sepsis due to undetermined organism [facility]) Active Problems:   Hypokalemia   Hypothyroidism   Mixed hyperlipidemia   Essential hypertension   Type 2 diabetes mellitus with complication, with long-term current use of insulin (HCC)  Resolved Problems:   * No resolved hospital problems. *  Hospital Course:  Alexandra [name] is a 61 y.o. female with medical history significant of seasonal allergies, anemia, schizoaffective disorder, bipolar disorder, type 2 diabetes, fatty liver disease, GERD, hiatal hernia, hyperlipidemia, hyperplastic colon polyp, hypertension, hypothyroidism, history of stroke, left-sided hemiparesis, history of neuropathy, right rotator cuff tear who is brought to the emergency department due to fever and altered mental status.   This morning her husband found her on the couch and called EMS.  She stated she has been coughing for the past 2 weeks.  She has had trouble expectorating phlegm.  She also has a sore throat for the past 2 days.  Her appetite is decreased.  No  wheezing or hemoptysis.  No chest pain, palpitations, diaphoresis, PND, orthopnea or pitting edema of the lower extremities.  No abdominal pain, diarrhea, constipation, melena or hematochezia.  No flank pain, dysuria, frequency or hematuria.  No polyuria, polydipsia, polyphagia or blurred vision.   ED course: Initial vital signs were temperature 103 F, pulse 117, respiration 30, BP 168/87 mmHg O2 sat 96% on room air.  The patient received 2500 mL of normal saline bolus, K-Lor 40 mEq p.o. x1, ceftriaxone and Rocephin IVPB   Lab work: Urinalysis was hazy with ketonuria 5 mg/dL, but otherwise normal.   Lactic acid was 2.2 mmol/L.  CBC showed a white count 21.7 with 84% neutrophils, hemoglobin 14.4 g/dL and platelets 327.  PT 13.5, INR 1.0 and PTT 28.  CMP with a potassium of 3.1 mmol/L, glucose 218 and calcium 8.7 mg/dL.   Imaging: Portable 1 view chest radiograph with a possible left retrocardiac pulmonary opacity.  Consider atelectasis, aspiration or infection.  CT head without contrast with no acute intracranial normality.   Assessment and Plan: Principal Problem: Sepsis due to undetermined organism (HCC)/ Community-acquired pneumonia -Met sepsis criteria on admission -Improving sepsis physiology This Morning vitals: BP 137/79, pulse 87, temp. 99.1 F (37.3 C), RR16, on RA SpO2 92 %.  WBC improved from  23.3 to 13   -Was on supplemental oxygen on admission, has been weaned off to room air -Continue scheduled and as needed bronchodilators. Managed with ceftriaxone 1 g IVPB daily, and azithromycin, was switched to p.o. strep pneumoniae urinary antigen-negative. Negative sputum Gram stain, culture and sensitivity. Negative blood culture and sensitivity.  On DC switch to Augmentin 581m tid for 5 days.  Active Problems:  Hypothyroidism Continue levothyroxine 112 mcg p.o. daily.     Mixed hyperlipidemia Continue simvastatin 20 mg p.o. daily.     Type 2 diabetes mellitus with complication,    with long-term current use of insulin (HCC)  Carbohydrate modified diet. Continue Lantus 20 units daily. Monitoring CBG q. ACHS, SSI coverage Continue duloxetine and gabapentin for neuropathy.   Essential hypertension Continue bisoprolol and hydrochlorothiazide.            Consultants: None Procedures performed: None  Disposition: Home Diet  Physician Discharge Summary   Patient: Alexandra [name] MRN: [id number] DOB: 1960/08/30  Admit date:     06/03/2022  Discharge date: 06/06/22  Discharge Physician: Manfred Shirts    PCP: Dorothyann Peng, NP   Recommendations at discharge:    Follow up with PCP  Discharge Diagnoses: Principal Problem:   Sepsis due to undetermined organism [facility]) Active Problems:   Hypokalemia   Hypothyroidism   Mixed hyperlipidemia   Essential hypertension   Type 2 diabetes mellitus with complication, with long-term current use of insulin (HCC)  Resolved Problems:   * No resolved hospital problems. *  Hospital Course:  Alexandra [name] is a 61 y.o. female with medical history significant of seasonal allergies, anemia, schizoaffective disorder, bipolar disorder, type 2 diabetes, fatty liver disease, GERD, hiatal hernia, hyperlipidemia, hyperplastic colon polyp, hypertension, hypothyroidism, history of stroke, left-sided hemiparesis, history of neuropathy, right rotator cuff tear who is brought to the emergency department due to fever and altered mental status.   This morning her husband found her on the couch and called EMS.  She stated she has been coughing for the past 2 weeks.  She has had trouble expectorating phlegm.  She also has a sore throat for the past 2 days.  Her appetite is decreased.  No  wheezing or hemoptysis.  No chest pain, palpitations, diaphoresis, PND, orthopnea or pitting edema of the lower extremities.  No abdominal pain, diarrhea, constipation, melena or hematochezia.  No flank pain, dysuria, frequency or hematuria.  No polyuria, polydipsia, polyphagia or blurred vision.   ED course: Initial vital signs were temperature 103 F, pulse 117, respiration 30, BP 168/87 mmHg O2 sat 96% on room air.  The patient received 2500 mL of normal saline bolus, K-Lor 40 mEq p.o. x1, ceftriaxone and Rocephin IVPB   Lab work: Urinalysis was hazy with ketonuria 5 mg/dL, but otherwise normal.   Lactic acid was 2.2 mmol/L.  CBC showed a white count 21.7 with 84% neutrophils, hemoglobin 14.4 g/dL and platelets 327.  PT 13.5, INR 1.0 and PTT 28.  CMP with a potassium of 3.1 mmol/L, glucose 218 and calcium 8.7 mg/dL.   Imaging: Portable 1 view chest radiograph with a possible left retrocardiac pulmonary opacity.  Consider atelectasis, aspiration or infection.  CT head without contrast with no acute intracranial normality.   Assessment and Plan: Principal Problem: Sepsis due to undetermined organism (HCC)/ Community-acquired pneumonia -Met sepsis criteria on admission -Improving sepsis physiology This Morning vitals: BP 137/79, pulse 87, temp. 99.1 F (37.3 C), RR16, on RA SpO2 92 %.  WBC improved from  23.3 to 13   -Was on supplemental oxygen on admission, has been weaned off to room air -Continue scheduled and as needed bronchodilators. Managed with ceftriaxone 1 g IVPB daily, and azithromycin, was switched to p.o. strep pneumoniae urinary antigen-negative. Negative sputum Gram stain, culture and sensitivity. Negative blood culture and sensitivity.  On DC switch to Augmentin 581m tid for 5 days.  Active Problems:  Hypothyroidism Continue levothyroxine 112 mcg p.o. daily.     Mixed hyperlipidemia Continue simvastatin 20 mg p.o. daily.     Type 2 diabetes mellitus with complication,    with long-term current use of insulin (HCC)  Carbohydrate modified diet. Continue Lantus 20 units daily. Monitoring CBG q. ACHS, SSI coverage Continue duloxetine and gabapentin for neuropathy.   Essential hypertension Continue bisoprolol and hydrochlorothiazide.            Consultants: None Procedures performed: None  Disposition: Home Diet

## 2022-06-06 NOTE — TOC Transition Note (Signed)
Transition of Care [facility]) - CM/SW Discharge Note   Patient Details  Name: Alexandra [name] MRN: [id number] Date of Birth: [birth year]  [facility] CM/SW Contact:  Leeroy Cha, RN Phone Number: 06/06/2022, 10:30 AM   Clinical Narrative:    Patient discharged to return to home via family car.  No toc needs present.   Final next level of care: Home/Self Care Barriers to Discharge: Barriers Resolved   Patient Goals and CMS Choice Patient states their goals for this hospitalization and ongoing recovery are:: to go home and be well CMS Medicare.gov Compare Post Acute Care list provided to:: Patient    Discharge Placement                       Discharge Plan and Services   Discharge Planning Services: CM Consult                                 Social Determinants of Health (SDOH) Interventions     Readmission Risk Interventions   No data to display

## 2022-06-08 LAB — CULTURE, BLOOD (ROUTINE X 2)
Culture: NO GROWTH
Culture: NO GROWTH
Special Requests: ADEQUATE

## 2022-06-10 ENCOUNTER — Telehealth: Payer: Self-pay

## 2022-06-10 NOTE — Telephone Encounter (Signed)
Transition Care Management Follow-up Telephone Call Date of discharge and from where: [facility] 06-06-22 Dx: Sepsis due to undetermined organism/CAP How have you been since you were released from the hospital? Feeling better  Any questions or concerns? No  Items Reviewed: Did the pt receive and understand the discharge instructions provided? Yes  Medications obtained and verified? Yes  Other? No  Any new allergies since your discharge? No  Dietary orders reviewed? No Do you have support at home? Yes   Home Care and Equipment/Supplies: Were home health services ordered? no If so, what is the name of the agency? na  Has the agency set up a time to come to the patient's home? not applicable Were any new equipment or medical supplies ordered?  No What is the name of the medical supply agency? na Were you able to get the supplies/equipment? not applicable Do you have any questions related to the use of the equipment or supplies? No  Functional Questionnaire: (I = Independent and D = Dependent) ADLs: I  Bathing/Dressing- I  Meal Prep- I  Eating- I  Maintaining continence- I  Transferring/Ambulation- I  Managing Meds- I  Follow up appointments reviewed:  PCP Hospital f/u appt confirmed? Yes  Scheduled to see Dorothyann Peng, NP on 06-11-22 @ 130pm. [facility] f/u appt confirmed? No  . Are transportation arrangements needed? No  If their condition worsens, is the pt aware to call PCP or go to the Emergency Dept.? Yes Was the patient provided with contact information for the PCP's office or ED? Yes Was to pt encouraged to call back with questions or concerns? Yes   Juanda Crumble LPN [place] Direct Dial [phone]

## 2022-06-11 ENCOUNTER — Ambulatory Visit (INDEPENDENT_AMBULATORY_CARE_PROVIDER_SITE_OTHER): Payer: Medicare Other | Admitting: Adult Health

## 2022-06-11 ENCOUNTER — Encounter: Payer: Self-pay | Admitting: Adult Health

## 2022-06-11 VITALS — BP 120/80 | HR 84 | Temp 98.0°F | Ht 61.0 in | Wt 166.0 lb

## 2022-06-11 DIAGNOSIS — E1140 Type 2 diabetes mellitus with diabetic neuropathy, unspecified: Secondary | ICD-10-CM

## 2022-06-11 DIAGNOSIS — I10 Essential (primary) hypertension: Secondary | ICD-10-CM | POA: Diagnosis not present

## 2022-06-11 DIAGNOSIS — J189 Pneumonia, unspecified organism: Secondary | ICD-10-CM | POA: Diagnosis not present

## 2022-06-11 DIAGNOSIS — Z862 Personal history of diseases of the blood and blood-forming organs and certain disorders involving the immune mechanism: Secondary | ICD-10-CM

## 2022-06-11 DIAGNOSIS — E039 Hypothyroidism, unspecified: Secondary | ICD-10-CM

## 2022-06-11 DIAGNOSIS — E782 Mixed hyperlipidemia: Secondary | ICD-10-CM | POA: Diagnosis not present

## 2022-06-11 DIAGNOSIS — A419 Sepsis, unspecified organism: Secondary | ICD-10-CM

## 2022-06-11 LAB — BASIC METABOLIC PANEL
BUN: 2 mg/dL — ABNORMAL LOW (ref 6–23)
CO2: 34 mEq/L — ABNORMAL HIGH (ref 19–32)
Calcium: 8.9 mg/dL (ref 8.4–10.5)
Chloride: 99 mEq/L (ref 96–112)
Creatinine, Ser: 0.64 mg/dL (ref 0.40–1.20)
GFR: 94.84 mL/min (ref >60.00)
Glucose, Bld: 93 mg/dL (ref 70–99)
Potassium: 3.5 mEq/L (ref 3.5–5.1)
Sodium: 137 mEq/L (ref 135–145)

## 2022-06-11 LAB — CBC WITH DIFFERENTIAL/PLATELET
Basophils Absolute: 0.1 10*3/uL (ref 0.0–0.1)
Basophils Relative: 0.4 % (ref 0.0–3.0)
Eosinophils Absolute: 0.3 10*3/uL (ref 0.0–0.7)
Eosinophils Relative: 2.3 % (ref 0.0–5.0)
HCT: 37.4 % (ref 36.0–46.0)
Hemoglobin: 12.6 g/dL (ref 12.0–15.0)
Lymphocytes Relative: 38.8 % (ref 12.0–46.0)
Lymphs Abs: 5.4 10*3/uL — ABNORMAL HIGH (ref 0.7–4.0)
MCHC: 33.8 g/dL (ref 30.0–36.0)
MCV: 93 fl (ref 78.0–100.0)
Monocytes Absolute: 1 10*3/uL (ref 0.1–1.0)
Monocytes Relative: 7.2 % (ref 3.0–12.0)
Neutro Abs: 7.1 10*3/uL (ref 1.4–7.7)
Neutrophils Relative %: 51.3 % (ref 43.0–77.0)
Platelets: 459 10*3/uL — ABNORMAL HIGH (ref 150.0–400.0)
RBC: 4.02 Mil/uL (ref 3.87–5.11)
RDW: 12.8 % (ref 11.5–15.5)
WBC: 13.8 10*3/uL — ABNORMAL HIGH (ref 4.0–10.5)

## 2022-06-11 NOTE — Progress Notes (Signed)
Subjective:    Patient ID: Alexandra [name], female    DOB: Oct 03, 1960, 61 y.o.   MRN: [id number]  HPI 61 year old female who  has a past medical history of Allergy, Anemia, Blood transfusion without reported diagnosis, Diabetes mellitus ([place]), Fatty liver, GERD (gastroesophageal reflux disease), Hiatal hernia (07/29/1996), Hyperlipidemia, Hyperplastic colon polyp, Hypertension, Hypothyroidism, Neuromuscular disorder ([place]), Rotator cuff tear, right, and Stroke ([place]).  She presents to the office today for TCM visit   Admit Date 06/03/2022 Discharge Date 06/06/2022  She was brought to the emergency department due to fever and altered mental status.  The morning of arrival her husband found her on the couch and called EMS.  She stated that she has been coughing for the past 2 weeks.  She also had a sore throat for the past 2 days.  Her appetite was decreased.  She denied wheezing or hemoptysis.  She did not have any chest pain, palpitations, diaphoresis, PND, orthopnea, or pitting edema of her lower extremities.  Furthermore she had no abdominal pain, diarrhea, constipation, melena.  No dysuria, frequency, hematuria, or flank pain  Her initial vital signs in the emergency room were temperature of 103 F, pulse 117, respirations 30, BP 168/87, and she was 96% on room air.  She received 2500 mL normal saline bolus, K-Lor 40 mEq x 1, ceftriaxone and Rocephin IV  Her lab work showed a white count of 21.7 with 84% neutrophils, hemoglobin 14.4, platelets 327.  PT 13.5, INR 1.0 and PTT 28.  Her glucose was 218, calcium 8.7, potassium 3.1.  Her urinalysis was hazy with ketonuria 5 MGs per DL but otherwise normal  Chest x-ray showed possible left show cardiac pulmonary opacity.  CT head without contrast with no acute intracranial abnormality  Hospital Course  Sepsis due to undetermined organism/community-acquired pneumonia -Met sepsis criteria on admission and throughout hospital admission her  symptoms improved. -On supplemental oxygen on admission but was weaned to room air. -With ceftriaxone 1 g IV to be daily and azithromycin that was switched to p.o. -Strep pneumoniae urine antigen was negative.  Negative sputum Gram stain, culture and sensitivity and negative blood culture and sensitivity -Switch to Augmentin 500 mg 3 times daily x5 days on discharge  Hypothyroidism -Continue on levothyroxine 112 mcg daily  Mixed hyperlipidemia -Continue on simvastatin 20 mg daily  Diabetes type 2 with long-term current use of insulin -Continue Lantus 20 units daily and trulicity 3 mg weekly.   She was continued on duloxetine and gabapentin for neuropathy  Essential Hypertension  - continue Ziac 10-6.25 mg daily.   Today she reports that she is feeling better since being discharged from the hospital.  She continues to feel fatigued more so towards the end of the day but this is improving as well.  She has not developed any fevers, chills, abdominal pain, nausea, vomiting, shortness of breath, or wheezing since being discharged.  She continues to have a mild sore throat and some left-sided ear fullness but this is improving as well.  She finished her oral antibiotics earlier today.   Review of Systems  Constitutional:  Positive for fatigue.  HENT:  Positive for ear pain and sore throat.   Eyes: Negative.   Respiratory: Negative.    Cardiovascular: Negative.   Gastrointestinal: Negative.   Endocrine: Negative.   Genitourinary: Negative.   Musculoskeletal: Negative.   Skin: Negative.   Allergic/Immunologic: Negative.   Neurological: Negative.   Hematological: Negative.   Psychiatric/Behavioral: Negative.  Past Medical History:  Diagnosis Date   Allergy    Anemia    past hx   Blood transfusion without reported diagnosis    Diabetes mellitus ([place])    Fatty liver    GERD (gastroesophageal reflux disease)    prn med.   Hiatal hernia 07/29/1996   Hyperlipidemia     Hyperplastic colon polyp    Hypertension    new dx. - will start med. 06/28/2011   Hypothyroidism    Neuromuscular disorder ([facility])    hemiplegia , neuropathy   Rotator cuff tear, right    Stroke [facility])    TIA 2016    Social History   Socioeconomic History   Marital status: Married    Spouse name: Not on file   Number of children: Not on file   Years of education: Not on file   Highest education level: Bachelor's degree (e.g., BA, AB, BS)  Occupational History   Occupation: unemployed  Tobacco Use   Smoking status: Former    Packs/day: 1.00    Years: 10.00    Total pack years: 10.00    Types: Cigarettes    Quit date: 07/29/1984    Years since quitting: 37.8   Smokeless tobacco: Never  Vaping Use   Vaping Use: Never used  Substance and Sexual Activity   Alcohol use: Yes    Comment: occassionally   Drug use: No   Sexual activity: Yes  Other Topics Concern   Not on file  Social History Narrative   Married for 32 years    Six children- all live around here. Two daughters live with her   19 grand children    On Disability for mental issues   No pets   Likes to go to movies and out to dinner. She likes to do puzzle books. Likes to read      She eats a healthy diet   She does not exercise    Lives at home with husband and granddaughter   Caffeine: 4 cups/day   Social Determinants of Health   Financial Resource Strain: Low Risk  (03/08/2022)   Overall Financial Resource Strain (CARDIA)    Difficulty of Paying Living Expenses: Not hard at all  Food Insecurity: No Food Insecurity (05/08/2022)   Hunger Vital Sign    Worried About Running Out of Food in the Last Year: Never true    Ran Out of Food in the Last Year: Never true  Transportation Needs: No Transportation Needs (05/08/2022)   PRAPARE - Hydrologist (Medical): No    Lack of Transportation (Non-Medical): No  Physical Activity: Inactive (03/08/2022)   Exercise Vital Sign    Days of  Exercise per Week: 0 days    Minutes of Exercise per Session: 0 min  Stress: No Stress Concern Present (03/08/2022)   [place]    Feeling of Stress : Not at all  Social Connections: [place] (03/08/2022)   Social Connection and Isolation Panel [NHANES]    Frequency of Communication with Friends and Family: More than three times a week    Frequency of Social Gatherings with Friends and Family: More than three times a week    Attends Religious Services: More than 4 times per year    Active Member of [employer] or Organizations: Yes    Attends Music therapist: More than 4 times per year    Marital Status: Married  Human resources officer  Violence: Not At Risk (03/08/2022)   Humiliation, Afraid, Rape, and Kick questionnaire    Fear of Current or Ex-Partner: No    Emotionally Abused: No    Physically Abused: No    Sexually Abused: No    Past Surgical History:  Procedure Laterality Date   ABDOMINAL HYSTERECTOMY  06/25/2000   left salpingo-oophorectomy   BACK SURGERY  07/30/1995   discetomy, lumbar spinal fusion   CARDIAC CATHETERIZATION  04/01/2006   CESAREAN SECTION  07/29/1989   CHOLECYSTECTOMY  07/30/1987   COLONOSCOPY     EXPLORATORY LAPAROTOMY  05/08/2000   right salpingo-oophorectomy   LAMINOTOMY / EXCISION DISK POSTERIOR CERVICAL SPINE  08/10/2009   C7-T1   MENISCUS REPAIR Right 04/2021   Knee   POLYPECTOMY     [facility] 2012   ROUX-EN-Y GASTRIC BYPASS  05/19/2007   SHOULDER ARTHROSCOPY  01/02/2006; 05/31/2004   left 2007; right 2005   SHOULDER ARTHROSCOPY  07/02/2011   Procedure: ARTHROSCOPY SHOULDER;  Surgeon: Cammie Sickle., MD;  Location: [facility];  Service: Orthopedics;  Laterality: Right;  repair supraspinatus, Debride glenohumeral joint, labrum   SHOULDER ARTHROSCOPY DISTAL CLAVICLE EXCISION AND OPEN ROTATOR CUFF REPAIR  07/26/2010   right   THYROID SURGERY  early 2000s    goiter   TOTAL 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/2021   Procedure: TOTAL KNEE ARTHROPLASTY;  Surgeon: Renette Butters, MD;  Location: [facility];  Service: Orthopedics;  Laterality: Right;   TUBAL LIGATION  1993    Family History  Problem Relation Age of Onset   Diabetes Mother    Hyperlipidemia Mother    Hypertension Mother    Thyroid disease Mother    Rheum arthritis Mother    COPD Mother    Cancer Paternal Grandfather        Prostate Cancer   Coronary artery disease Other    Colon cancer Neg Hx    Colon polyps Neg Hx    Esophageal cancer Neg Hx    Rectal cancer Neg Hx    Stomach cancer Neg Hx    Pancreatic cancer Neg Hx    Sleep apnea Neg Hx     Allergies  Allergen Reactions   Shrimp (Diagnostic) Anaphylaxis    Eating shrimp causes swelling of tongue and rash   Ace Inhibitors Cough    Current Outpatient Medications on File Prior to Visit  Medication Sig Dispense Refill   acetaminophen (TYLENOL) 500 MG tablet Take 2 tablets (1,000 mg total) by mouth every 6 (six) hours as needed for mild pain or moderate pain. 60 tablet 0   B Complex Vitamins (B COMPLEX PO) Take 1 tablet by mouth daily.     BIOTIN PO Take 1 tablet by mouth daily.     bisoprolol-hydrochlorothiazide (ZIAC) 10-6.25 MG tablet Take 1 tablet by mouth daily. 90 tablet 3   Blood Glucose Monitoring Suppl (ONETOUCH VERIO) w/Device KIT 1 Device by Does not apply route daily. Use to check blood glucose 4 times day or prn 1 kit 0   Cholecalciferol (VITAMIN D3 PO) Take 1 tablet by mouth daily.     Continuous Blood Gluc Sensor (FREESTYLE LIBRE 3 SENSOR) MISC 1 Device by Does not apply route every 14 (fourteen) days. Place 1 sensor on the skin every 14 days. Use to check glucose continuously 6 each 3   diclofenac Sodium (VOLTAREN) 1 % GEL 2 g 3 (three) times daily as needed (pain).     DULoxetine (CYMBALTA) 30 MG capsule Take 30 mg  by mouth every morning.     ferrous sulfate 325 (65 FE) MG EC tablet Take 325 mg by mouth daily  with breakfast.     fluticasone (FLONASE) 50 MCG/ACT nasal spray USE 2 SPRAYS IN EACH NOSTRIL DAILY (Patient taking differently: 2 sprays daily as needed for allergies or rhinitis.) 48 g 3   gabapentin (NEURONTIN) 400 MG capsule TAKE 2 CAPSULES THREE TIMES A DAY (Patient taking differently: Take 800 mg by mouth 3 (three) times daily.) 540 capsule 3   glucose blood (ONETOUCH VERIO) test strip Use to check blood sugar 4times a day or PRn 300 strip 3   HYDROcodone-acetaminophen (NORCO/VICODIN) 5-325 MG tablet Take 1 tablet by mouth every 8 (eight) hours as needed for moderate pain.     insulin glargine (LANTUS SOLOSTAR) 100 UNIT/ML Solostar Pen Inject 20 Units into the skin daily. 20 mL 0   Insulin Pen Needle (B-D UF III MINI PEN NEEDLES) 31G X 5 MM MISC USE WITH INSULIN PEN AS DIRECTED 100 each 3   Lancet Devices MISC Use to check blood sugar TID 100 each 2   levothyroxine (SYNTHROID) 112 MCG tablet Take 1 tablet (112 mcg total) by mouth daily. 90 tablet 3   meloxicam (MOBIC) 7.5 MG tablet Take 7.5 mg by mouth daily.     metFORMIN (GLUCOPHAGE) 1000 MG tablet TAKE 1 TABLET BY MOUTH DAILY  WITH BREAKFAST (Patient taking differently: Take 1,000 mg by mouth daily with breakfast.) 90 tablet 3   ondansetron (ZOFRAN-ODT) 4 MG disintegrating tablet Take 1 tablet (4 mg total) by mouth 2 (two) times daily as needed for nausea or vomiting. 10 tablet 0   ONE TOUCH LANCETS MISC Test blood sugars once daily Dx: E11.9 100 each 3   oxybutynin (DITROPAN-XL) 5 MG 24 hr tablet Take 1 tablet (5 mg total) by mouth at bedtime. 90 tablet 3   pantoprazole (PROTONIX) 40 MG tablet Take 1 tablet (40 mg total) by mouth daily. 100 tablet 2   risperiDONE (RISPERDAL) 1 MG tablet Take 1 mg by mouth 2 (two) times daily.     simvastatin (ZOCOR) 20 MG tablet Take 1 tablet (20 mg total) by mouth at bedtime. 90 tablet 3   traZODone (DESYREL) 100 MG tablet Take 200 mg by mouth at bedtime.     TRULICITY 3 FO/2.7XA SOPN INJECT THE CONTENTS  OF ONE PEN  SUBCUTANEOUSLY WEEKLY AS  DIRECTED (Patient taking differently: Inject 3 mg into the skin once a week. Monday) 6 mL 3   VITAMIN E PO Take 1 capsule by mouth daily.     amoxicillin-clavulanate (AUGMENTIN) 500-125 MG tablet Take 1 tablet by mouth 3 (three) times daily. 15 tablet 0   No current facility-administered medications on file prior to visit.    BP 120/80   Pulse 84   Temp 98 F (36.7 C) (Oral)   Ht _0  (1.549 m)   Wt 166 lb (75.3 kg)   SpO2 98%   BMI 31.37 kg/m       Objective:   Physical Exam Vitals and nursing note reviewed.  Constitutional:      General: She is not in acute distress.    Appearance: Normal appearance. She is well-developed. She is not ill-appearing.  HENT:     Head: Normocephalic and atraumatic.     Right Ear: Ear canal and external ear normal. A middle ear effusion is present. There is no impacted cerumen. Tympanic membrane is not erythematous.     Left Ear: Tympanic membrane  and ear canal normal. There is no impacted cerumen.     Nose: Nose normal. No congestion or rhinorrhea.     Mouth/Throat:     Mouth: Mucous membranes are moist.     Pharynx: Oropharynx is clear. No oropharyngeal exudate or posterior oropharyngeal erythema.     Tonsils: No tonsillar exudate or tonsillar abscesses.  Eyes:     General:        Right eye: No discharge.        Left eye: No discharge.     Extraocular Movements: Extraocular movements intact.     Conjunctiva/sclera: Conjunctivae normal.     Pupils: Pupils are equal, round, and reactive to light.  Neck:     Thyroid: No thyromegaly.     Vascular: No carotid bruit.     Trachea: No tracheal deviation.  Cardiovascular:     Rate and Rhythm: Normal rate and regular rhythm.     Pulses: Normal pulses.     Heart sounds: Normal heart sounds. No murmur heard.    No friction rub. No gallop.  Pulmonary:     Effort: Pulmonary effort is normal. No respiratory distress.     Breath sounds: Normal breath sounds.  No stridor. No wheezing, rhonchi or rales.  Chest:     Chest wall: No tenderness.  Musculoskeletal:        General: No swelling, tenderness, deformity or signs of injury. Normal range of motion.     Cervical back: Normal range of motion and neck supple.     Right lower leg: No edema.     Left lower leg: No edema.  Lymphadenopathy:     Cervical: No cervical adenopathy.  Skin:    General: Skin is warm and dry.     Coloration: Skin is not jaundiced or pale.     Findings: No bruising, erythema, lesion or rash.  Neurological:     General: No focal deficit present.     Mental Status: She is alert and oriented to person, place, and time.     Cranial Nerves: No cranial nerve deficit.     Sensory: No sensory deficit.     Motor: No weakness.     Coordination: Coordination normal.     Gait: Gait normal.     Deep Tendon Reflexes: Reflexes normal.  Psychiatric:        Mood and Affect: Mood normal.        Behavior: Behavior normal.        Thought Content: Thought content normal.        Judgment: Judgment normal.       Assessment & Plan:  1. Sepsis due to undetermined organism [facility]) -Reviewed hospital notes, discharge instructions, labs, imaging, and medications with the patient.  All questions answered to the best of my ability. - CBC with Differential/Platelet; Future - Basic Metabolic Panel; Future - Basic Metabolic Panel - CBC with Differential/Platelet  2. Community acquired pneumonia of left lower lobe of lung - Encouraged deep breathing exercises throughout the day  - Can use Mucinex as night  - will repeat chest xray at her next visit in a month  - CBC with Differential/Platelet; Future - Basic Metabolic Panel; Future - Basic Metabolic Panel - CBC with Differential/Platelet  3. Type 2 diabetes mellitus with diabetic neuropathy, without long-term current use of insulin ([place]) - Continue home medications   4. Essential hypertension - Well controlled. No change in  medication - CBC with Differential/Platelet; Future - Basic Metabolic Panel; Future -  Basic Metabolic Panel - CBC with Differential/Platelet  5. Mixed hyperlipidemia - Continue home statin   6. Hypothyroidism, unspecified type - Continue synthroid   Dorothyann Peng, NP

## 2022-06-11 NOTE — Patient Instructions (Addendum)
I am going to recheck your blood work today   We want to recheck your chest xray in about a month

## 2022-06-12 ENCOUNTER — Other Ambulatory Visit: Payer: Self-pay | Admitting: Adult Health

## 2022-06-12 DIAGNOSIS — A419 Sepsis, unspecified organism: Secondary | ICD-10-CM

## 2022-06-12 NOTE — Progress Notes (Signed)
  Progress Note   Date: 06/12/2022  Patient Name: Alexandra [name]        MRN#: [id number]   Clarification of diagnosis:   Severe Sepsis -Evidenced by Lactic acidosis and Pneumonia.

## 2022-06-18 DIAGNOSIS — M25561 Pain in right knee: Secondary | ICD-10-CM | POA: Diagnosis not present

## 2022-06-18 DIAGNOSIS — G629 Polyneuropathy, unspecified: Secondary | ICD-10-CM | POA: Diagnosis not present

## 2022-06-18 DIAGNOSIS — G8929 Other chronic pain: Secondary | ICD-10-CM | POA: Diagnosis not present

## 2022-06-18 DIAGNOSIS — M545 Low back pain, unspecified: Secondary | ICD-10-CM | POA: Diagnosis not present

## 2022-06-18 DIAGNOSIS — M25562 Pain in left knee: Secondary | ICD-10-CM | POA: Diagnosis not present

## 2022-06-18 DIAGNOSIS — Z79899 Other long term (current) drug therapy: Secondary | ICD-10-CM | POA: Diagnosis not present

## 2022-07-02 ENCOUNTER — Other Ambulatory Visit (INDEPENDENT_AMBULATORY_CARE_PROVIDER_SITE_OTHER): Payer: Medicare Other

## 2022-07-02 DIAGNOSIS — R197 Diarrhea, unspecified: Secondary | ICD-10-CM | POA: Diagnosis not present

## 2022-07-02 DIAGNOSIS — A419 Sepsis, unspecified organism: Secondary | ICD-10-CM | POA: Diagnosis not present

## 2022-07-02 LAB — BASIC METABOLIC PANEL
BUN: 6 mg/dL (ref 6–23)
CO2: 29 mEq/L (ref 19–32)
Calcium: 8.8 mg/dL (ref 8.4–10.5)
Chloride: 98 mEq/L (ref 96–112)
Creatinine, Ser: 0.74 mg/dL (ref 0.40–1.20)
GFR: 86.79 mL/min (ref >60.00)
Glucose, Bld: 291 mg/dL — ABNORMAL HIGH (ref 70–99)
Potassium: 3.4 mEq/L — ABNORMAL LOW (ref 3.5–5.1)
Sodium: 137 mEq/L (ref 135–145)

## 2022-07-02 LAB — CBC WITH DIFFERENTIAL/PLATELET
Basophils Absolute: 0 10*3/uL (ref 0.0–0.1)
Basophils Relative: 0.5 % (ref 0.0–3.0)
Eosinophils Absolute: 0.2 10*3/uL (ref 0.0–0.7)
Eosinophils Relative: 3.1 % (ref 0.0–5.0)
HCT: 39.1 % (ref 36.0–46.0)
Hemoglobin: 13.2 g/dL (ref 12.0–15.0)
Lymphocytes Relative: 29.4 % (ref 12.0–46.0)
Lymphs Abs: 2.3 10*3/uL (ref 0.7–4.0)
MCHC: 33.6 g/dL (ref 30.0–36.0)
MCV: 94.2 fl (ref 78.0–100.0)
Monocytes Absolute: 0.6 10*3/uL (ref 0.1–1.0)
Monocytes Relative: 7.3 % (ref 3.0–12.0)
Neutro Abs: 4.6 10*3/uL (ref 1.4–7.7)
Neutrophils Relative %: 59.7 % (ref 43.0–77.0)
Platelets: 318 10*3/uL (ref 150.0–400.0)
RBC: 4.16 Mil/uL (ref 3.87–5.11)
RDW: 13.9 % (ref 11.5–15.5)
WBC: 7.7 10*3/uL (ref 4.0–10.5)

## 2022-07-16 DIAGNOSIS — G8929 Other chronic pain: Secondary | ICD-10-CM | POA: Diagnosis not present

## 2022-07-16 DIAGNOSIS — M25561 Pain in right knee: Secondary | ICD-10-CM | POA: Diagnosis not present

## 2022-07-16 DIAGNOSIS — Z96651 Presence of right artificial knee joint: Secondary | ICD-10-CM | POA: Diagnosis not present

## 2022-07-17 ENCOUNTER — Other Ambulatory Visit: Payer: Self-pay | Admitting: Adult Health

## 2022-07-17 DIAGNOSIS — E1140 Type 2 diabetes mellitus with diabetic neuropathy, unspecified: Secondary | ICD-10-CM

## 2022-07-17 MED ORDER — LANTUS SOLOSTAR 100 UNIT/ML ~~LOC~~ SOPN: 20.0000 [IU] | PEN_INJECTOR | Freq: Every day | SUBCUTANEOUS | 0 refills | Status: DC

## 2022-07-18 DIAGNOSIS — M1711 Unilateral primary osteoarthritis, right knee: Secondary | ICD-10-CM | POA: Diagnosis not present

## 2022-07-18 DIAGNOSIS — G629 Polyneuropathy, unspecified: Secondary | ICD-10-CM | POA: Diagnosis not present

## 2022-07-18 DIAGNOSIS — M25561 Pain in right knee: Secondary | ICD-10-CM | POA: Diagnosis not present

## 2022-07-18 DIAGNOSIS — Z79899 Other long term (current) drug therapy: Secondary | ICD-10-CM | POA: Diagnosis not present

## 2022-07-23 DIAGNOSIS — E119 Type 2 diabetes mellitus without complications: Secondary | ICD-10-CM | POA: Diagnosis not present

## 2022-07-23 DIAGNOSIS — Z794 Long term (current) use of insulin: Secondary | ICD-10-CM | POA: Diagnosis not present

## 2022-07-29 ENCOUNTER — Other Ambulatory Visit: Payer: Self-pay | Admitting: Adult Health

## 2022-07-29 DIAGNOSIS — I10 Essential (primary) hypertension: Secondary | ICD-10-CM

## 2022-07-29 DIAGNOSIS — E039 Hypothyroidism, unspecified: Secondary | ICD-10-CM

## 2022-08-02 ENCOUNTER — Encounter: Payer: Self-pay | Admitting: Adult Health

## 2022-08-02 ENCOUNTER — Ambulatory Visit (INDEPENDENT_AMBULATORY_CARE_PROVIDER_SITE_OTHER): Payer: Medicare Other | Admitting: Adult Health

## 2022-08-02 VITALS — BP 130/80 | HR 89 | Temp 97.5°F | Ht 61.0 in | Wt 173.0 lb

## 2022-08-02 DIAGNOSIS — I10 Essential (primary) hypertension: Secondary | ICD-10-CM | POA: Diagnosis not present

## 2022-08-02 DIAGNOSIS — G8929 Other chronic pain: Secondary | ICD-10-CM | POA: Diagnosis not present

## 2022-08-02 DIAGNOSIS — E1140 Type 2 diabetes mellitus with diabetic neuropathy, unspecified: Secondary | ICD-10-CM | POA: Diagnosis not present

## 2022-08-02 DIAGNOSIS — M25561 Pain in right knee: Secondary | ICD-10-CM | POA: Diagnosis not present

## 2022-08-02 LAB — POCT GLYCOSYLATED HEMOGLOBIN (HGB A1C): Hemoglobin A1C: 6.9 % — AB (ref 4.0–5.6)

## 2022-08-02 MED ORDER — TIRZEPATIDE 5 MG/0.5ML ~~LOC~~ SOAJ: 5.0000 mg | SUBCUTANEOUS | 0 refills | Status: AC

## 2022-08-02 NOTE — Progress Notes (Signed)
Subjective:    Patient ID: Alexandra [name], female    DOB: Feb 02, 1961, 62 y.o.   MRN: [id number]  HPI 62 year old female who  has a past medical history of Allergy, Anemia, Blood transfusion without reported diagnosis, Diabetes mellitus ([place]), Fatty liver, GERD (gastroesophageal reflux disease), Hiatal hernia (07/29/1996), Hyperlipidemia, Hyperplastic colon polyp, Hypertension, Hypothyroidism, Neuromuscular disorder ([place]), Rotator cuff tear, right, and Stroke ([place]).  She presents to the office today for 83-monthfollow-up regarding diabetes and hypertension  Diabetes mellitus type II-managed with Trulicity 3 mg weekly, metformin 1000 mg daily, and Lantus 20 units daily.  She uses the lSisterssensor to manage her blood sugars. She has been in target 65 % of the time with the remaining time being elevated. No hypoglycemia. Her average over 90 days has been 174.   Lab Results  Component Value Date   HGBA1C 7.3 (A) 05/03/2022   Hypertension-managed with Ziac 10-6.25 mg daily.  She denies dizziness, lightheadedness, chest pain, or shortness of breath  BP Readings from Last 3 Encounters:  08/02/22 130/80  06/11/22 120/80  06/06/22 128/77   Review of Systems See HPI   Past Medical History:  Diagnosis Date   Allergy    Anemia    past hx   Blood transfusion without reported diagnosis    Diabetes mellitus ([place]    Fatty liver    GERD (gastroesophageal reflux disease)    prn med.   Hiatal hernia 07/29/1996   Hyperlipidemia    Hyperplastic colon polyp    Hypertension    new dx. - will start med. 06/28/2011   Hypothyroidism    Neuromuscular disorder (HCC)    hemiplegia , neuropathy   Rotator cuff tear, right    Stroke ([facility]    TIA 2016    Social History   Socioeconomic History   Marital status: Married    Spouse name: Not on file   Number of children: Not on file   Years of education: Not on file   Highest education level: Bachelor's degree (e.g., BA, AB, BS)   Occupational History   Occupation: unemployed  Tobacco Use   Smoking status: Former    Packs/day: 1.00    Years: 10.00    Total pack years: 10.00    Types: Cigarettes    Quit date: 07/29/1984    Years since quitting: 38.0   Smokeless tobacco: Never  Vaping Use   Vaping Use: Never used  Substance and Sexual Activity   Alcohol use: Yes    Comment: occassionally   Drug use: No   Sexual activity: Yes  Other Topics Concern   Not on file  Social History Narrative   Married for 32 years    Six children- all live around here. Two daughters live with her   189grand children    On Disability for mental issues   No pets   Likes to go to movies and out to dinner. She likes to do puzzle books. Likes to read      She eats a healthy diet   She does not exercise    Lives at home with husband and granddaughter   Caffeine: 4 cups/day   Social Determinants of Health   Financial Resource Strain: Low Risk  (03/08/2022)   Overall Financial Resource Strain (CARDIA)    Difficulty of Paying Living Expenses: Not hard at all  Food Insecurity: No Food Insecurity (05/08/2022)   Hunger Vital Sign    Worried About Running Out of  Food in the Last Year: Never true    [place] in the Last Year: Never true  Transportation Needs: No Transportation Needs (05/08/2022)   PRAPARE - Hydrologist (Medical): No    Lack of Transportation (Non-Medical): No  Physical Activity: Inactive (03/08/2022)   Exercise Vital Sign    Days of Exercise per Week: 0 days    Minutes of Exercise per Session: 0 min  Stress: No Stress Concern Present (03/08/2022)   [place]    Feeling of Stress : Not at all  Social Connections: [place] (03/08/2022)   Social Connection and Isolation Panel [NHANES]    Frequency of Communication with Friends and Family: More than three times a week    Frequency of Social Gatherings  with Friends and Family: More than three times a week    Attends Religious Services: More than 4 times per year    Active Member of Clubs or Organizations: Yes    Attends Archivist Meetings: More than 4 times per year    Marital Status: Married  Human resources officer Violence: Not At Risk (03/08/2022)   Humiliation, Afraid, Rape, and Kick questionnaire    Fear of Current or Ex-Partner: No    Emotionally Abused: No    Physically Abused: No    Sexually Abused: No    Past Surgical History:  Procedure Laterality Date   ABDOMINAL HYSTERECTOMY  06/25/2000   left salpingo-oophorectomy   BACK SURGERY  07/30/1995   discetomy, lumbar spinal fusion   CARDIAC CATHETERIZATION  04/01/2006   CESAREAN SECTION  07/29/1989   CHOLECYSTECTOMY  07/30/1987   COLONOSCOPY     EXPLORATORY LAPAROTOMY  05/08/2000   right salpingo-oophorectomy   LAMINOTOMY / EXCISION DISK POSTERIOR CERVICAL SPINE  08/10/2009   C7-T1   MENISCUS REPAIR Right 04/2021   Knee   POLYPECTOMY     HPP 2012   ROUX-EN-Y GASTRIC BYPASS  05/19/2007   SHOULDER ARTHROSCOPY  01/02/2006; 05/31/2004   left 2007; right 2005   SHOULDER ARTHROSCOPY  07/02/2011   Procedure: ARTHROSCOPY SHOULDER;  Surgeon: Cammie Sickle., MD;  Location: [place];  Service: Orthopedics;  Laterality: Right;  repair supraspinatus, Debride glenohumeral joint, labrum   SHOULDER ARTHROSCOPY DISTAL CLAVICLE EXCISION AND OPEN ROTATOR CUFF REPAIR  07/26/2010   right   THYROID SURGERY  early 2000s   goiter   TOTAL 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/2021   Procedure: TOTAL KNEE ARTHROPLASTY;  Surgeon: Renette Butters, MD;  [facility];  Service: Orthopedics;  Laterality: Right;   TUBAL LIGATION  1993    Family History  Problem Relation Age of Onset   Diabetes Mother    Hyperlipidemia Mother    Hypertension Mother    Thyroid disease Mother    Rheum arthritis Mother    COPD Mother    Cancer Paternal Grandfather        Prostate Cancer    Coronary artery disease Other    Colon cancer Neg Hx    Colon polyps Neg Hx    Esophageal cancer Neg Hx    Rectal cancer Neg Hx    Stomach cancer Neg Hx    Pancreatic cancer Neg Hx    Sleep apnea Neg Hx     Allergies  Allergen Reactions   Shrimp (Diagnostic) Anaphylaxis    Eating shrimp causes swelling of tongue and rash   Ace Inhibitors Cough    Current  Outpatient Medications on File Prior to Visit  Medication Sig Dispense Refill   acetaminophen (TYLENOL) 500 MG tablet Take 2 tablets (1,000 mg total) by mouth every 6 (six) hours as needed for mild pain or moderate pain. 60 tablet 0   B Complex Vitamins (B COMPLEX PO) Take 1 tablet by mouth daily.     BIOTIN PO Take 1 tablet by mouth daily.     bisoprolol-hydrochlorothiazide (ZIAC) 10-6.25 MG tablet TAKE 1 TABLET BY MOUTH DAILY 100 tablet 2   Blood Glucose Monitoring Suppl (ONETOUCH VERIO) w/Device KIT 1 Device by Does not apply route daily. Use to check blood glucose 4 times day or prn 1 kit 0   Cholecalciferol (VITAMIN D3 PO) Take 1 tablet by mouth daily.     Continuous Blood Gluc Sensor (FREESTYLE LIBRE 3 SENSOR) MISC 1 Device by Does not apply route every 14 (fourteen) days. Place 1 sensor on the skin every 14 days. Use to check glucose continuously 6 each 3   diclofenac Sodium (VOLTAREN) 1 % GEL 2 g 3 (three) times daily as needed (pain).     DULoxetine (CYMBALTA) 30 MG capsule Take 30 mg by mouth every morning.     ferrous sulfate 325 (65 FE) MG EC tablet Take 325 mg by mouth daily with breakfast.     fluticasone (FLONASE) 50 MCG/ACT nasal spray USE 2 SPRAYS IN EACH NOSTRIL DAILY (Patient taking differently: 2 sprays daily as needed for allergies or rhinitis.) 48 g 3   gabapentin (NEURONTIN) 400 MG capsule TAKE 2 CAPSULES THREE TIMES A DAY (Patient taking differently: Take 800 mg by mouth 3 (three) times daily.) 540 capsule 3   glucose blood (ONETOUCH VERIO) test strip Use to check blood sugar 4times a day or PRn 300 strip 3    HYDROcodone-acetaminophen (NORCO/VICODIN) 5-325 MG tablet Take 1 tablet by mouth every 8 (eight) hours as needed for moderate pain.     insulin glargine (LANTUS SOLOSTAR) 100 UNIT/ML Solostar Pen Inject 20 Units into the skin daily. 20 mL 0   Insulin Pen Needle (B-D UF III MINI PEN NEEDLES) 31G X 5 MM MISC USE WITH INSULIN PEN AS DIRECTED 100 each 3   Lancet Devices MISC Use to check blood sugar TID 100 each 2   levothyroxine (SYNTHROID) 112 MCG tablet TAKE 1 TABLET BY MOUTH DAILY 100 tablet 2   meloxicam (MOBIC) 7.5 MG tablet Take 7.5 mg by mouth daily.     metFORMIN (GLUCOPHAGE) 1000 MG tablet TAKE 1 TABLET BY MOUTH DAILY  WITH BREAKFAST (Patient taking differently: Take 1,000 mg by mouth daily with breakfast.) 90 tablet 3   ondansetron (ZOFRAN-ODT) 4 MG disintegrating tablet Take 1 tablet (4 mg total) by mouth 2 (two) times daily as needed for nausea or vomiting. 10 tablet 0   ONE TOUCH LANCETS MISC Test blood sugars once daily Dx: E11.9 100 each 3   oxybutynin (DITROPAN-XL) 5 MG 24 hr tablet Take 1 tablet (5 mg total) by mouth at bedtime. 90 tablet 3   pantoprazole (PROTONIX) 40 MG tablet Take 1 tablet (40 mg total) by mouth daily. 100 tablet 2   risperiDONE (RISPERDAL) 1 MG tablet Take 1 mg by mouth 2 (two) times daily.     simvastatin (ZOCOR) 20 MG tablet Take 1 tablet (20 mg total) by mouth at bedtime. 90 tablet 3   traZODone (DESYREL) 100 MG tablet Take 200 mg by mouth at bedtime.     VITAMIN E PO Take 1 capsule by mouth daily.  No current facility-administered medications on file prior to visit.    BP 130/80   Pulse 89   Temp (!) 97.5 F (36.4 C) (Oral)   Ht '5\' 1"'$  (1.549 m)   Wt 173 lb (78.5 kg)   SpO2 94%   BMI 32.69 kg/m       Objective:   Physical Exam Vitals and nursing note reviewed.  Constitutional:      Appearance: Normal appearance.  Cardiovascular:     Rate and Rhythm: Normal rate and regular rhythm.     Pulses: Normal pulses.     Heart sounds: Normal  heart sounds.  Pulmonary:     Effort: Pulmonary effort is normal.     Breath sounds: Normal breath sounds.  Skin:    General: Skin is warm and dry.  Neurological:     Mental Status: She is alert.        Assessment & Plan:  1. Type 2 diabetes mellitus with diabetic neuropathy, without long-term current use of insulin (HCC)  - POC HgB A1c- 6.9 - has improved but still having spikes. Will switch her Trulicity to [employer]. She will send me a mychart message in one month to let me know how she is doing and will likely increase at that time.  - tirzepatide [facility] 5 MG/0.5ML Pen; Inject 5 mg into the skin once a week.  Dispense: 6 mL; Refill: 0  2. Essential hypertension - Well controlled. No change in medications   Dorothyann Peng, NP

## 2022-08-02 NOTE — Patient Instructions (Signed)
Your A1c was 6.9   I am going to switch your trulicity to a new medication called Mounjaro   Please send me a mychart in a month to let me know when you are ready to increase your dose   I will see you back in 3 months

## 2022-08-20 DIAGNOSIS — M25561 Pain in right knee: Secondary | ICD-10-CM | POA: Diagnosis not present

## 2022-08-20 DIAGNOSIS — Z79899 Other long term (current) drug therapy: Secondary | ICD-10-CM | POA: Diagnosis not present

## 2022-08-20 DIAGNOSIS — M1711 Unilateral primary osteoarthritis, right knee: Secondary | ICD-10-CM | POA: Diagnosis not present

## 2022-08-20 DIAGNOSIS — E1140 Type 2 diabetes mellitus with diabetic neuropathy, unspecified: Secondary | ICD-10-CM | POA: Diagnosis not present

## 2022-08-29 ENCOUNTER — Ambulatory Visit (INDEPENDENT_AMBULATORY_CARE_PROVIDER_SITE_OTHER): Payer: Medicare Other | Admitting: Adult Health

## 2022-08-29 ENCOUNTER — Ambulatory Visit (INDEPENDENT_AMBULATORY_CARE_PROVIDER_SITE_OTHER)
Admission: RE | Admit: 2022-08-29 | Discharge: 2022-08-29 | Disposition: A | Discharge status: Home / Self Care | Payer: Medicare Other | Source: Ambulatory Visit | Attending: Adult Health | Admitting: Adult Health

## 2022-08-29 ENCOUNTER — Encounter: Payer: Self-pay | Admitting: Adult Health

## 2022-08-29 VITALS — BP 110/70 | HR 79 | Temp 98.0°F | Ht 61.0 in | Wt 176.0 lb

## 2022-08-29 DIAGNOSIS — R051 Acute cough: Secondary | ICD-10-CM | POA: Diagnosis not present

## 2022-08-29 MED ORDER — GUAIFENESIN-CODEINE 100-6.33 MG/5ML PO SOLN: 10.0000 mL | Freq: Three times a day (TID) | ORAL | 0 refills | Status: DC | PRN

## 2022-08-29 MED ORDER — ALBUTEROL SULFATE HFA 108 (90 BASE) MCG/ACT IN AERS: 2.0000 | INHALATION_SPRAY | Freq: Four times a day (QID) | RESPIRATORY_TRACT | 0 refills | Status: DC | PRN

## 2022-08-29 MED ORDER — BENZONATATE 200 MG PO CAPS: 200.0000 mg | ORAL_CAPSULE | Freq: Two times a day (BID) | ORAL | 0 refills | Status: DC | PRN

## 2022-08-29 NOTE — Progress Notes (Signed)
Subjective:    Patient ID: Alexandra [name], female    DOB: 12-26-1960, 62 y.o.   MRN: [id number]  HPI 62 year old female who  has a past medical history of Allergy, Anemia, Blood transfusion without reported diagnosis, Diabetes mellitus ([place]), Fatty liver, GERD (gastroesophageal reflux disease), Hiatal hernia (07/29/1996), Hyperlipidemia, Hyperplastic colon polyp, Hypertension, Hypothyroidism, Neuromuscular disorder ([place]), Rotator cuff tear, right, and Stroke ([place]).  She presents to the office today for a cough that she has had four about two months when she developed pneumonia and was admitted. She feels as though she cannot " get the mucus out". At home she has been using Robatussin DM, Nyquil, and Mucinex without much imporvement.  She does endorse intermittent fevers but denies shortness of breath, nausea, vomiting, or diarrhea.  Her biggest complaint is the cough keeping her up at night and she is unable to sleep.  She did have a follow-up chest x-ray   Review of Systems See HPI   Past Medical History:  Diagnosis Date   Allergy    Anemia    past hx   Blood transfusion without reported diagnosis    Diabetes mellitus ([place])    Fatty liver    GERD (gastroesophageal reflux disease)    prn med.   Hiatal hernia 07/29/1996   Hyperlipidemia    Hyperplastic colon polyp    Hypertension    new dx. - will start med. 06/28/2011   Hypothyroidism    Neuromuscular disorder (HCC)    hemiplegia , neuropathy   Rotator cuff tear, right    Stroke [facility])    TIA 2016    Social History   Socioeconomic History   Marital status: Married    Spouse name: Not on file   Number of children: Not on file   Years of education: Not on file   Highest education level: Bachelor's degree (e.g., BA, AB, BS)  Occupational History   Occupation: unemployed  Tobacco Use   Smoking status: Former    Packs/day: 1.00    Years: 10.00    Total pack years: 10.00    Types: Cigarettes    Quit date:  07/29/1984    Years since quitting: 38.1   Smokeless tobacco: Never  Vaping Use   Vaping Use: Never used  Substance and Sexual Activity   Alcohol use: Yes    Comment: occassionally   Drug use: No   Sexual activity: Yes  Other Topics Concern   Not on file  Social History Narrative   Married for 32 years    Six children- all live around here. Two daughters live with her   97 grand children    On Disability for mental issues   No pets   Likes to go to movies and out to dinner. She likes to do puzzle books. Likes to read      She eats a healthy diet   She does not exercise    Lives at home with husband and granddaughter   Caffeine: 4 cups/day   Social Determinants of Health   Financial Resource Strain: Low Risk  (03/08/2022)   Overall Financial Resource Strain (CARDIA)    Difficulty of Paying Living Expenses: Not hard at all  Food Insecurity: No Food Insecurity (05/08/2022)   Hunger Vital Sign    Worried About Running Out of Food in the Last Year: Never true    Ran Out of Food in the Last Year: Never true  Transportation Needs: No Transportation Needs (05/08/2022)  PRAPARE - Hydrologist (Medical): No    Lack of Transportation (Non-Medical): No  Physical Activity: Inactive (03/08/2022)   Exercise Vital Sign    Days of Exercise per Week: 0 days    Minutes of Exercise per Session: 0 min  Stress: No Stress Concern Present (03/08/2022)   [place]    Feeling of Stress : Not at all  Social Connections: Unknown (03/08/2022)   Social Connection and Isolation Panel [NHANES]    Frequency of Communication with Friends and Family: More than three times a week    Frequency of Social Gatherings with Friends and Family: More than three times a week    Attends Religious Services: More than 4 times per year    Active Member of Clubs or Organizations: Yes    Attends Archivist Meetings:  More than 4 times per year    Marital Status: Not on file  Intimate Partner Violence: Not At Risk (03/08/2022)   Humiliation, Afraid, Rape, and Kick questionnaire    Fear of Current or Ex-Partner: No    Emotionally Abused: No    Physically Abused: No    Sexually Abused: No    Past Surgical History:  Procedure Laterality Date   ABDOMINAL HYSTERECTOMY  06/25/2000   left salpingo-oophorectomy   BACK SURGERY  07/30/1995   discetomy, lumbar spinal fusion   CARDIAC CATHETERIZATION  04/01/2006   CESAREAN SECTION  07/29/1989   CHOLECYSTECTOMY  07/30/1987   COLONOSCOPY     EXPLORATORY LAPAROTOMY  05/08/2000   right salpingo-oophorectomy   LAMINOTOMY / EXCISION DISK POSTERIOR CERVICAL SPINE  08/10/2009   C7-T1   MENISCUS REPAIR Right 04/2021   Knee   POLYPECTOMY     HPP 2012   ROUX-EN-Y GASTRIC BYPASS  05/19/2007   SHOULDER ARTHROSCOPY  01/02/2006; 05/31/2004   left 2007; right 2005   SHOULDER ARTHROSCOPY  07/02/2011   Procedure: ARTHROSCOPY SHOULDER;  Surgeon: Cammie Sickle., MD;  Location: [place];  Service: Orthopedics;  Laterality: Right;  repair supraspinatus, Debride glenohumeral joint, labrum   SHOULDER ARTHROSCOPY DISTAL CLAVICLE EXCISION AND OPEN ROTATOR CUFF REPAIR  07/26/2010   right   THYROID SURGERY  early 2000s   goiter   TOTAL 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/2021   Procedure: TOTAL KNEE ARTHROPLASTY;  Surgeon: Renette Butters, MD;  [facility];  Service: Orthopedics;  Laterality: Right;   TUBAL LIGATION  1993    Family History  Problem Relation Age of Onset   Diabetes Mother    Hyperlipidemia Mother    Hypertension Mother    Thyroid disease Mother    Rheum arthritis Mother    COPD Mother    Cancer Paternal Grandfather        Prostate Cancer   Coronary artery disease Other    Colon cancer Neg Hx    Colon polyps Neg Hx    Esophageal cancer Neg Hx    Rectal cancer Neg Hx    Stomach cancer Neg Hx    Pancreatic cancer Neg Hx    Sleep  apnea Neg Hx     Allergies  Allergen Reactions   Shrimp (Diagnostic) Anaphylaxis    Eating shrimp causes swelling of tongue and rash   Ace Inhibitors Cough    Current Outpatient Medications on File Prior to Visit  Medication Sig Dispense Refill   acetaminophen (TYLENOL) 500 MG tablet Take 2 tablets (1,000 mg total) by mouth every  6 (six) hours as needed for mild pain or moderate pain. 60 tablet 0   B Complex Vitamins (B COMPLEX PO) Take 1 tablet by mouth daily.     BIOTIN PO Take 1 tablet by mouth daily.     bisoprolol-hydrochlorothiazide (ZIAC) 10-6.25 MG tablet TAKE 1 TABLET BY MOUTH DAILY 100 tablet 2   Blood Glucose Monitoring Suppl (ONETOUCH VERIO) w/Device KIT 1 Device by Does not apply route daily. Use to check blood glucose 4 times day or prn 1 kit 0   Cholecalciferol (VITAMIN D3 PO) Take 1 tablet by mouth daily.     Continuous Blood Gluc Sensor (FREESTYLE LIBRE 3 SENSOR) MISC 1 Device by Does not apply route every 14 (fourteen) days. Place 1 sensor on the skin every 14 days. Use to check glucose continuously 6 each 3   diclofenac Sodium (VOLTAREN) 1 % GEL 2 g 3 (three) times daily as needed (pain).     DULoxetine (CYMBALTA) 30 MG capsule Take 30 mg by mouth every morning.     ferrous sulfate 325 (65 FE) MG EC tablet Take 325 mg by mouth daily with breakfast.     fluticasone (FLONASE) 50 MCG/ACT nasal spray USE 2 SPRAYS IN EACH NOSTRIL DAILY (Patient taking differently: 2 sprays daily as needed for allergies or rhinitis.) 48 g 3   gabapentin (NEURONTIN) 400 MG capsule TAKE 2 CAPSULES THREE TIMES A DAY (Patient taking differently: Take 800 mg by mouth 3 (three) times daily.) 540 capsule 3   glucose blood (ONETOUCH VERIO) test strip Use to check blood sugar 4times a day or PRn 300 strip 3   HYDROcodone-acetaminophen (NORCO/VICODIN) 5-325 MG tablet Take 1 tablet by mouth every 8 (eight) hours as needed for moderate pain.     insulin glargine (LANTUS SOLOSTAR) 100 UNIT/ML Solostar Pen  Inject 20 Units into the skin daily. 20 mL 0   Insulin Pen Needle (B-D UF III MINI PEN NEEDLES) 31G X 5 MM MISC USE WITH INSULIN PEN AS DIRECTED 100 each 3   Lancet Devices MISC Use to check blood sugar TID 100 each 2   levothyroxine (SYNTHROID) 112 MCG tablet TAKE 1 TABLET BY MOUTH DAILY 100 tablet 2   meloxicam (MOBIC) 7.5 MG tablet Take 7.5 mg by mouth daily.     metFORMIN (GLUCOPHAGE) 1000 MG tablet TAKE 1 TABLET BY MOUTH DAILY  WITH BREAKFAST (Patient taking differently: Take 1,000 mg by mouth daily with breakfast.) 90 tablet 3   ondansetron (ZOFRAN-ODT) 4 MG disintegrating tablet Take 1 tablet (4 mg total) by mouth 2 (two) times daily as needed for nausea or vomiting. 10 tablet 0   ONE TOUCH LANCETS MISC Test blood sugars once daily Dx: E11.9 100 each 3   oxybutynin (DITROPAN-XL) 5 MG 24 hr tablet Take 1 tablet (5 mg total) by mouth at bedtime. 90 tablet 3   pantoprazole (PROTONIX) 40 MG tablet Take 1 tablet (40 mg total) by mouth daily. 100 tablet 2   risperiDONE (RISPERDAL) 1 MG tablet Take 1 mg by mouth 2 (two) times daily.     simvastatin (ZOCOR) 20 MG tablet Take 1 tablet (20 mg total) by mouth at bedtime. 90 tablet 3   tirzepatide (MOUNJARO) 5 MG/0.5ML Pen Inject 5 mg into the skin once a week. 6 mL 0   traZODone (DESYREL) 100 MG tablet Take 200 mg by mouth at bedtime.     VITAMIN E PO Take 1 capsule by mouth daily.     No current facility-administered medications on  file prior to visit.    BP 110/70   Pulse 79   Temp 98 F (36.7 C) (Oral)   Ht '5\' 1"'$  (1.549 m)   Wt 176 lb (79.8 kg)   SpO2 95%   BMI 33.25 kg/m       Objective:   Physical Exam Vitals and nursing note reviewed.  Constitutional:      Appearance: Normal appearance.  Cardiovascular:     Rate and Rhythm: Normal rate and regular rhythm.     Pulses: Normal pulses.     Heart sounds: Normal heart sounds.  Pulmonary:     Effort: Pulmonary effort is normal.     Breath sounds: Normal breath sounds.   Musculoskeletal:        General: Normal range of motion.  Skin:    General: Skin is warm and dry.  Neurological:     General: No focal deficit present.     Mental Status: She is alert and oriented to person, place, and time.  Psychiatric:        Mood and Affect: Mood normal.        Behavior: Behavior normal.        Thought Content: Thought content normal.        Judgment: Judgment normal.        Assessment & Plan:  1. Acute cough -Lungs clear on exam no wheezing or rhonchi heard.  Will have her follow-up for chest x-ray.  Prescribed cough syrup Tessalon Perles and will send in albuterol inhaler.  Also advised to continue to use Mucinex but drink a lot of water with it, something that she was not doing. - guaiFENesin-Codeine 100-6.33 MG/5ML SOLN; Take 10 mLs by mouth every 8 (eight) hours as needed.  Dispense: 473 mL; Refill: 0 - benzonatate (TESSALON) 200 MG capsule; Take 1 capsule (200 mg total) by mouth 2 (two) times daily as needed for cough.  Dispense: 20 capsule; Refill: 0 - DG Chest 2 View; Future - albuterol (VENTOLIN HFA) 108 (90 Base) MCG/ACT inhaler; Inhale 2 puffs into the lungs every 6 (six) hours as needed for wheezing or shortness of breath.  Dispense: 8 g; Refill: 0   Dorothyann Peng, NP

## 2022-09-03 DIAGNOSIS — M25561 Pain in right knee: Secondary | ICD-10-CM | POA: Diagnosis not present

## 2022-09-03 DIAGNOSIS — Z96651 Presence of right artificial knee joint: Secondary | ICD-10-CM | POA: Diagnosis not present

## 2022-09-03 DIAGNOSIS — G894 Chronic pain syndrome: Secondary | ICD-10-CM | POA: Diagnosis not present

## 2022-09-18 ENCOUNTER — Other Ambulatory Visit: Payer: Self-pay | Admitting: Adult Health

## 2022-09-18 DIAGNOSIS — Z79899 Other long term (current) drug therapy: Secondary | ICD-10-CM | POA: Diagnosis not present

## 2022-09-18 DIAGNOSIS — M25562 Pain in left knee: Secondary | ICD-10-CM | POA: Diagnosis not present

## 2022-09-18 DIAGNOSIS — M1711 Unilateral primary osteoarthritis, right knee: Secondary | ICD-10-CM | POA: Diagnosis not present

## 2022-09-18 DIAGNOSIS — E1140 Type 2 diabetes mellitus with diabetic neuropathy, unspecified: Secondary | ICD-10-CM

## 2022-09-18 DIAGNOSIS — M25561 Pain in right knee: Secondary | ICD-10-CM | POA: Diagnosis not present

## 2022-09-21 ENCOUNTER — Other Ambulatory Visit: Payer: Self-pay | Admitting: Adult Health

## 2022-09-21 DIAGNOSIS — R051 Acute cough: Secondary | ICD-10-CM

## 2022-09-24 ENCOUNTER — Encounter: Payer: Self-pay | Admitting: Adult Health

## 2022-09-24 ENCOUNTER — Other Ambulatory Visit (HOSPITAL_COMMUNITY): Payer: Self-pay

## 2022-09-24 DIAGNOSIS — E1140 Type 2 diabetes mellitus with diabetic neuropathy, unspecified: Secondary | ICD-10-CM

## 2022-09-24 MED ORDER — TIRZEPATIDE 7.5 MG/0.5ML ~~LOC~~ SOAJ
7.5000 mg | SUBCUTANEOUS | 1 refills | Status: DC
  Filled 2022-09-24: qty 2, 28d supply, fill #0

## 2022-09-24 NOTE — Telephone Encounter (Signed)
This has been refilled for a higher dose. Per pt she was advised by Tommi Rumps to inform him if blood sugars were not managed on the '5mg'$  dose. I articulated to [facility] of the update and verbally advised me to send in the 7.'5mg'$  dose. Pt aware of update.

## 2022-09-25 ENCOUNTER — Other Ambulatory Visit (HOSPITAL_COMMUNITY): Payer: Self-pay

## 2022-09-25 ENCOUNTER — Other Ambulatory Visit: Payer: Self-pay | Admitting: Adult Health

## 2022-09-27 ENCOUNTER — Other Ambulatory Visit: Payer: Self-pay

## 2022-09-27 ENCOUNTER — Other Ambulatory Visit (HOSPITAL_COMMUNITY): Payer: Self-pay

## 2022-09-27 NOTE — Telephone Encounter (Signed)
Pt notified that we sent the [facility] to [facility] Pt advised [facility] and ready for pick up. Pt verbalized understanding.

## 2022-10-02 ENCOUNTER — Other Ambulatory Visit: Payer: Self-pay | Admitting: Adult Health

## 2022-10-02 DIAGNOSIS — M792 Neuralgia and neuritis, unspecified: Secondary | ICD-10-CM

## 2022-10-02 DIAGNOSIS — E1140 Type 2 diabetes mellitus with diabetic neuropathy, unspecified: Secondary | ICD-10-CM

## 2022-10-02 DIAGNOSIS — E7800 Pure hypercholesterolemia, unspecified: Secondary | ICD-10-CM

## 2022-10-11 DIAGNOSIS — Z794 Long term (current) use of insulin: Secondary | ICD-10-CM | POA: Diagnosis not present

## 2022-10-11 DIAGNOSIS — M25561 Pain in right knee: Secondary | ICD-10-CM | POA: Diagnosis not present

## 2022-10-11 DIAGNOSIS — E119 Type 2 diabetes mellitus without complications: Secondary | ICD-10-CM | POA: Diagnosis not present

## 2022-10-16 MED ORDER — TIRZEPATIDE 7.5 MG/0.5ML ~~LOC~~ SOAJ: 7.5000 mg | SUBCUTANEOUS | 1 refills | Status: DC

## 2022-10-16 NOTE — Addendum Note (Signed)
Addended by: Gwenyth Ober R on: 10/16/2022 07:44 AM   Modules accepted: Orders

## 2022-10-17 DIAGNOSIS — M25561 Pain in right knee: Secondary | ICD-10-CM | POA: Diagnosis not present

## 2022-10-17 DIAGNOSIS — Z79899 Other long term (current) drug therapy: Secondary | ICD-10-CM | POA: Diagnosis not present

## 2022-10-17 DIAGNOSIS — M1711 Unilateral primary osteoarthritis, right knee: Secondary | ICD-10-CM | POA: Diagnosis not present

## 2022-10-17 DIAGNOSIS — E1140 Type 2 diabetes mellitus with diabetic neuropathy, unspecified: Secondary | ICD-10-CM | POA: Diagnosis not present

## 2022-11-05 ENCOUNTER — Ambulatory Visit (INDEPENDENT_AMBULATORY_CARE_PROVIDER_SITE_OTHER): Payer: Medicare Other | Admitting: Adult Health

## 2022-11-05 ENCOUNTER — Other Ambulatory Visit (HOSPITAL_COMMUNITY): Payer: Self-pay

## 2022-11-05 ENCOUNTER — Other Ambulatory Visit: Payer: Self-pay

## 2022-11-05 ENCOUNTER — Encounter: Payer: Self-pay | Admitting: Adult Health

## 2022-11-05 VITALS — BP 130/80 | HR 57 | Temp 98.0°F | Ht 61.0 in | Wt 177.0 lb

## 2022-11-05 DIAGNOSIS — E1140 Type 2 diabetes mellitus with diabetic neuropathy, unspecified: Secondary | ICD-10-CM | POA: Diagnosis not present

## 2022-11-05 DIAGNOSIS — E039 Hypothyroidism, unspecified: Secondary | ICD-10-CM

## 2022-11-05 DIAGNOSIS — K219 Gastro-esophageal reflux disease without esophagitis: Secondary | ICD-10-CM

## 2022-11-05 DIAGNOSIS — E782 Mixed hyperlipidemia: Secondary | ICD-10-CM | POA: Diagnosis not present

## 2022-11-05 DIAGNOSIS — Z0000 Encounter for general adult medical examination without abnormal findings: Secondary | ICD-10-CM

## 2022-11-05 DIAGNOSIS — G894 Chronic pain syndrome: Secondary | ICD-10-CM

## 2022-11-05 DIAGNOSIS — I10 Essential (primary) hypertension: Secondary | ICD-10-CM | POA: Diagnosis not present

## 2022-11-05 DIAGNOSIS — M792 Neuralgia and neuritis, unspecified: Secondary | ICD-10-CM | POA: Diagnosis not present

## 2022-11-05 DIAGNOSIS — N3281 Overactive bladder: Secondary | ICD-10-CM

## 2022-11-05 LAB — COMPREHENSIVE METABOLIC PANEL
ALT: 45 U/L — ABNORMAL HIGH (ref 0–35)
AST: 44 U/L — ABNORMAL HIGH (ref 0–37)
Albumin: 4.2 g/dL (ref 3.5–5.2)
Alkaline Phosphatase: 75 U/L (ref 39–117)
BUN: 6 mg/dL (ref 6–23)
CO2: 32 mEq/L (ref 19–32)
Calcium: 9.4 mg/dL (ref 8.4–10.5)
Chloride: 99 mEq/L (ref 96–112)
Creatinine, Ser: 0.75 mg/dL (ref 0.40–1.20)
GFR: 85.2 mL/min (ref >60.00)
Glucose, Bld: 202 mg/dL — ABNORMAL HIGH (ref 70–99)
Potassium: 3.6 mEq/L (ref 3.5–5.1)
Sodium: 141 mEq/L (ref 135–145)
Total Bilirubin: 0.9 mg/dL (ref 0.2–1.2)
Total Protein: 7.2 g/dL (ref 6.0–8.3)

## 2022-11-05 LAB — CBC WITH DIFFERENTIAL/PLATELET
Basophils Absolute: 0 10*3/uL (ref 0.0–0.1)
Basophils Relative: 0.4 % (ref 0.0–3.0)
Eosinophils Absolute: 0.2 10*3/uL (ref 0.0–0.7)
Eosinophils Relative: 2.2 % (ref 0.0–5.0)
HCT: 42.2 % (ref 36.0–46.0)
Hemoglobin: 14.4 g/dL (ref 12.0–15.0)
Lymphocytes Relative: 27.9 % (ref 12.0–46.0)
Lymphs Abs: 2.6 10*3/uL (ref 0.7–4.0)
MCHC: 34.1 g/dL (ref 30.0–36.0)
MCV: 93.4 fl (ref 78.0–100.0)
Monocytes Absolute: 0.8 10*3/uL (ref 0.1–1.0)
Monocytes Relative: 8.2 % (ref 3.0–12.0)
Neutro Abs: 5.8 10*3/uL (ref 1.4–7.7)
Neutrophils Relative %: 61.3 % (ref 43.0–77.0)
Platelets: 346 10*3/uL (ref 150.0–400.0)
RBC: 4.52 Mil/uL (ref 3.87–5.11)
RDW: 13.5 % (ref 11.5–15.5)
WBC: 9.4 10*3/uL (ref 4.0–10.5)

## 2022-11-05 LAB — LIPID PANEL
Cholesterol: 125 mg/dL (ref 0–200)
HDL: 35.6 mg/dL — ABNORMAL LOW (ref >39.00)
LDL Cholesterol: 72 mg/dL (ref 0–99)
NonHDL: 89.73
Total CHOL/HDL Ratio: 4
Triglycerides: 89 mg/dL (ref 0.0–149.0)
VLDL: 17.8 mg/dL (ref 0.0–40.0)

## 2022-11-05 LAB — TSH: TSH: 2.56 u[IU]/mL (ref 0.35–5.50)

## 2022-11-05 LAB — MICROALBUMIN / CREATININE URINE RATIO
Creatinine,U: 221.8 mg/dL
Microalb Creat Ratio: 1 mg/g (ref 0.0–30.0)
Microalb, Ur: 2.3 mg/dL — ABNORMAL HIGH (ref 0.0–1.9)

## 2022-11-05 LAB — HEMOGLOBIN A1C: Hgb A1c MFr Bld: 8 % — ABNORMAL HIGH (ref 4.6–6.5)

## 2022-11-05 MED ORDER — TIRZEPATIDE 10 MG/0.5ML ~~LOC~~ SOAJ
10.0000 mg | SUBCUTANEOUS | 0 refills | Status: DC
  Filled 2022-11-05: qty 2, 28d supply, fill #0
  Filled 2022-12-02: qty 2, 28d supply, fill #1
  Filled 2022-12-27 – 2023-01-20 (×2): qty 2, 28d supply, fill #2

## 2022-11-05 NOTE — Patient Instructions (Signed)
It was great seeing you today   We will follow up with you regarding your lab work   Please let me know if you need anything   

## 2022-11-05 NOTE — Progress Notes (Signed)
Subjective:    Patient ID: Alexandra [name], female    DOB: 18-Jun-1961, 62 y.o.   MRN: [id number]  HPI Patient presents for yearly preventative medicine examination. She is a pleasant 62 year old female who  has a past medical history of Allergy, Anemia, Blood transfusion without reported diagnosis, Diabetes mellitus, Fatty liver, GERD (gastroesophageal reflux disease), Hiatal hernia (07/29/1996), Hyperlipidemia, Hyperplastic colon polyp, Hypertension, Hypothyroidism, Neuromuscular disorder, Rotator cuff tear, right, and Stroke.  Diabetes mellitus type II-managed with Mounjaro 7.5 mg weeklk, , metformin 1000 mg daily, and Lantus 20 units daily.  She uses the Bolton sensor to manage her blood sugars. She has been in target 47 % of the time with the remaining time being elevated. No hypoglycemia. Her average over 90 days has been 189  Lab Results  Component Value Date   HGBA1C 6.9 (A) 08/02/2022   Hypothyroidism-currently prescribed Synthroid 112 mcg. Lab Results  Component Value Date   TSH 4.35 10/31/2021   Hypertension - managed with Ziac 10-6.25  mg daily.  BP Readings from Last 3 Encounters:  11/05/22 130/80  08/29/22 110/70  08/02/22 130/80    Hyperlipidemia-managed with simvastatin 20 mg nightly.  She denies myalgia or fatigue Lab Results  Component Value Date   CHOL 129 10/31/2021   HDL 36.70 (L) 10/31/2021   LDLCALC 73 10/31/2021   TRIG 93.0 10/31/2021   CHOLHDL 4 10/31/2021    Diabetic neuropathy-prescribed gabapentin 800 mg 3 times daily.  OAB-well-controlled with Ditropan XL 5 mg   Chronic Pain -right knee.  She is followed by pain management at [facility].  Currently prescribed hydrocodone 5 mg 3 times daily and upcoming right knee replacement.   GERD - Takes Protonix 40 mg daily. Feels well controlled.   All immunizations and health maintenance protocols were reviewed with the patient and needed orders were placed.  Appropriate screening  laboratory values were ordered for the patient including screening of hyperlipidemia, renal function and hepatic function.  Medication reconciliation,  past medical history, social history, problem list and allergies were reviewed in detail with the patient  Goals were established with regard to weight loss, exercise, and  diet in compliance with medications Wt Readings from Last 3 Encounters:  11/05/22 177 lb (80.3 kg)  08/29/22 176 lb (79.8 kg)  08/02/22 173 lb (78.5 kg)   Review of Systems  Constitutional: Negative.   HENT: Negative.    Eyes: Negative.   Respiratory: Negative.    Cardiovascular: Negative.   Gastrointestinal: Negative.   Endocrine: Negative.   Genitourinary: Negative.   Musculoskeletal: Negative.   Skin: Negative.   Allergic/Immunologic: Negative.   Neurological: Negative.   Hematological: Negative.   Psychiatric/Behavioral: Negative.     Past Medical History:  Diagnosis Date   Allergy    Anemia    past hx   Blood transfusion without reported diagnosis    Diabetes mellitus    Fatty liver    GERD (gastroesophageal reflux disease)    prn med.   Hiatal hernia 07/29/1996   Hyperlipidemia    Hyperplastic colon polyp    Hypertension    new dx. - will start med. 06/28/2011   Hypothyroidism    Neuromuscular disorder    hemiplegia , neuropathy   Rotator cuff tear, right    Stroke    TIA 2016    Social History   Socioeconomic History   Marital status: Married    Spouse name: Not on file   Number of children: Not on  file   Years of education: Not on file   Highest education level: Bachelor's degree (e.g., BA, AB, BS)  Occupational History   Occupation: unemployed  Tobacco Use   Smoking status: Former    Packs/day: 1.00    Years: 10.00    Additional pack years: 0.00    Total pack years: 10.00    Types: Cigarettes    Quit date: 07/29/1984    Years since quitting: 38.2   Smokeless tobacco: Never  Vaping Use   Vaping Use: Never used  Substance  and Sexual Activity   Alcohol use: Yes    Comment: occassionally   Drug use: No   Sexual activity: Yes  Other Topics Concern   Not on file  Social History Narrative   Married for 32 years    Six children- all live around here. Two daughters live with her   10 grand children    On Disability for mental issues   No pets   Likes to go to movies and out to dinner. She likes to do puzzle books. Likes to read      She eats a healthy diet   She does not exercise    Lives at home with husband and granddaughter   Caffeine: 4 cups/day   Social Determinants of Health   Financial Resource Strain: Low Risk  (03/08/2022)   Overall Financial Resource Strain (CARDIA)    Difficulty of Paying Living Expenses: Not hard at all  Food Insecurity: No Food Insecurity (05/08/2022)   Hunger Vital Sign    Worried About Running Out of Food in the Last Year: Never true    Ran Out of Food in the Last Year: Never true  Transportation Needs: No Transportation Needs (05/08/2022)   PRAPARE - Administrator, Civil Service (Medical): No    Lack of Transportation (Non-Medical): No  Physical Activity: Inactive (03/08/2022)   Exercise Vital Sign    Days of Exercise per Week: 0 days    Minutes of Exercise per Session: 0 min  Stress: No Stress Concern Present (03/08/2022)   [employer] of Occupational Health - Occupational Stress Questionnaire    Feeling of Stress : Not at all  Social Connections: Unknown (03/08/2022)   Social Connection and Isolation Panel [NHANES]    Frequency of Communication with Friends and Family: More than three times a week    Frequency of Social Gatherings with Friends and Family: More than three times a week    Attends Religious Services: More than 4 times per year    Active Member of Clubs or Organizations: Yes    Attends Banker Meetings: More than 4 times per year    Marital Status: Not on file  Intimate Partner Violence: Not At Risk (03/08/2022)    Humiliation, Afraid, Rape, and Kick questionnaire    Fear of Current or Ex-Partner: No    Emotionally Abused: No    Physically Abused: No    Sexually Abused: No    Past Surgical History:  Procedure Laterality Date   ABDOMINAL HYSTERECTOMY  06/25/2000   left salpingo-oophorectomy   BACK SURGERY  07/30/1995   discetomy, lumbar spinal fusion   CARDIAC CATHETERIZATION  04/01/2006   CESAREAN SECTION  07/29/1989   CHOLECYSTECTOMY  07/30/1987   COLONOSCOPY     EXPLORATORY LAPAROTOMY  05/08/2000   right salpingo-oophorectomy   LAMINOTOMY / EXCISION DISK POSTERIOR CERVICAL SPINE  08/10/2009   C7-T1   MENISCUS REPAIR Right 04/2021   Knee  POLYPECTOMY     [facility] 2012   ROUX-EN-Y GASTRIC BYPASS  05/19/2007   SHOULDER ARTHROSCOPY  01/02/2006; 05/31/2004   left 2007; right 2005   SHOULDER ARTHROSCOPY  07/02/2011   Procedure: ARTHROSCOPY SHOULDER;  Surgeon: Wyn Forsterobert V Sypher Jr., MD;  Location: [facility];  Service: Orthopedics;  Laterality: Right;  repair supraspinatus, Debride glenohumeral joint, labrum   SHOULDER ARTHROSCOPY DISTAL CLAVICLE EXCISION AND OPEN ROTATOR CUFF REPAIR  07/26/2010   right   THYROID SURGERY  early 2000s   goiter   TOTAL 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/2021   Procedure: TOTAL KNEE ARTHROPLASTY;  Surgeon: Sheral ApleyMurphy, Timothy D, MD;  Location: [facility];  Service: Orthopedics;  Laterality: Right;   TUBAL LIGATION  1993    Family History  Problem Relation Age of Onset   Diabetes Mother    Hyperlipidemia Mother    Hypertension Mother    Thyroid disease Mother    Rheum arthritis Mother    COPD Mother    Cancer Paternal Grandfather        Prostate Cancer   Coronary artery disease Other    Colon cancer Neg Hx    Colon polyps Neg Hx    Esophageal cancer Neg Hx    Rectal cancer Neg Hx    Stomach cancer Neg Hx    Pancreatic cancer Neg Hx    Sleep apnea Neg Hx     Allergies  Allergen Reactions   Shrimp (Diagnostic) Anaphylaxis    Eating shrimp causes  swelling of tongue and rash   Ace Inhibitors Cough    Current Outpatient Medications on File Prior to Visit  Medication Sig Dispense Refill   acetaminophen (TYLENOL) 500 MG tablet Take 2 tablets (1,000 mg total) by mouth every 6 (six) hours as needed for mild pain or moderate pain. 60 tablet 0   albuterol (VENTOLIN HFA) 108 (90 Base) MCG/ACT inhaler TAKE 2 PUFFS BY MOUTH EVERY 6 HOURS AS NEEDED FOR WHEEZE OR SHORTNESS OF BREATH 18 each 1   B Complex Vitamins (B COMPLEX PO) Take 1 tablet by mouth daily.     BIOTIN PO Take 1 tablet by mouth daily.     bisoprolol-hydrochlorothiazide (ZIAC) 10-6.25 MG tablet TAKE 1 TABLET BY MOUTH DAILY 100 tablet 2   Blood Glucose Monitoring Suppl (ONETOUCH VERIO) w/Device KIT 1 Device by Does not apply route daily. Use to check blood glucose 4 times day or prn 1 kit 0   Cholecalciferol (VITAMIN D3 PO) Take 1 tablet by mouth daily.     Continuous Blood Gluc Sensor (FREESTYLE LIBRE 3 SENSOR) MISC 1 Device by Does not apply route every 14 (fourteen) days. Place 1 sensor on the skin every 14 days. Use to check glucose continuously 6 each 3   diclofenac Sodium (VOLTAREN) 1 % GEL 2 g 3 (three) times daily as needed (pain).     DULoxetine (CYMBALTA) 30 MG capsule Take 30 mg by mouth every morning.     ferrous sulfate 325 (65 FE) MG EC tablet Take 325 mg by mouth daily with breakfast.     fluticasone (FLONASE) 50 MCG/ACT nasal spray USE 2 SPRAYS IN EACH NOSTRIL DAILY (Patient taking differently: 2 sprays daily as needed for allergies or rhinitis.) 48 g 3   gabapentin (NEURONTIN) 400 MG capsule TAKE 2 CAPSULES BY MOUTH 3 TIMES DAILY 600 capsule 2   glucose blood (ONETOUCH VERIO) test strip USE TO CHECK BLOOD SUGAR 4 TIMES DAILY OR AS NEEDED 400 strip 2   HYDROcodone-acetaminophen (NORCO/VICODIN)  5-325 MG tablet Take 1 tablet by mouth every 8 (eight) hours as needed for moderate pain.     insulin glargine (LANTUS SOLOSTAR) 100 UNIT/ML Solostar Pen INJECT 20 UNITS INTO THE  SKIN DAILY 90 mL 0   Insulin Pen Needle (B-D UF III MINI PEN NEEDLES) 31G X 5 MM MISC USE WITH INSULIN PEN AS DIRECTED 100 each 3   Lancet Devices MISC Use to check blood sugar TID 100 each 2   levothyroxine (SYNTHROID) 112 MCG tablet TAKE 1 TABLET BY MOUTH DAILY 100 tablet 2   meloxicam (MOBIC) 7.5 MG tablet Take 7.5 mg by mouth daily.     metFORMIN (GLUCOPHAGE) 1000 MG tablet TAKE 1 TABLET BY MOUTH DAILY  WITH BREAKFAST 100 tablet 2   ondansetron (ZOFRAN-ODT) 4 MG disintegrating tablet Take 1 tablet (4 mg total) by mouth 2 (two) times daily as needed for nausea or vomiting. 10 tablet 0   ONE TOUCH LANCETS MISC Test blood sugars once daily Dx: E11.9 100 each 3   oxybutynin (DITROPAN-XL) 5 MG 24 hr tablet TAKE 1 TABLET BY MOUTH AT  BEDTIME 100 tablet 2   pantoprazole (PROTONIX) 40 MG tablet TAKE 1 TABLET BY MOUTH DAILY 100 tablet 2   risperiDONE (RISPERDAL) 1 MG tablet Take 1 mg by mouth 2 (two) times daily.     simvastatin (ZOCOR) 20 MG tablet TAKE 1 TABLET BY MOUTH AT  BEDTIME 100 tablet 2   tirzepatide (MOUNJARO) 7.5 MG/0.5ML Pen Inject 7.5 mg into the skin once a week. 6 mL 1   traZODone (DESYREL) 100 MG tablet Take 200 mg by mouth at bedtime.     VITAMIN E PO Take 1 capsule by mouth daily.     No current facility-administered medications on file prior to visit.    BP 130/80   Pulse (!) 57   Temp 98 F (36.7 C) (Oral)   Ht 5\' 1"  (1.549 m)   Wt 177 lb (80.3 kg)   SpO2 97%   BMI 33.44 kg/m       Objective:   Physical Exam Vitals and nursing note reviewed.  Constitutional:      General: She is not in acute distress.    Appearance: Normal appearance. She is not ill-appearing.  HENT:     Head: Normocephalic and atraumatic.     Right Ear: Tympanic membrane, ear canal and external ear normal. There is no impacted cerumen.     Left Ear: Tympanic membrane, ear canal and external ear normal. There is no impacted cerumen.     Nose: Nose normal. No congestion or rhinorrhea.      Mouth/Throat:     Mouth: Mucous membranes are moist.     Pharynx: Oropharynx is clear.  Eyes:     Extraocular Movements: Extraocular movements intact.     Conjunctiva/sclera: Conjunctivae normal.     Pupils: Pupils are equal, round, and reactive to light.  Neck:     Vascular: No carotid bruit.  Cardiovascular:     Rate and Rhythm: Normal rate and regular rhythm.     Pulses: Normal pulses.     Heart sounds: No murmur heard.    No friction rub. No gallop.  Pulmonary:     Effort: Pulmonary effort is normal.     Breath sounds: Normal breath sounds.  Abdominal:     General: Abdomen is flat. Bowel sounds are normal. There is no distension.     Palpations: Abdomen is soft. There is no mass.  Tenderness: There is no abdominal tenderness. There is no guarding or rebound.     Hernia: No hernia is present.  Musculoskeletal:        General: Normal range of motion.     Cervical back: Normal range of motion and neck supple.  Lymphadenopathy:     Cervical: No cervical adenopathy.  Skin:    General: Skin is warm and dry.     Capillary Refill: Capillary refill takes less than 2 seconds.  Neurological:     General: No focal deficit present.     Mental Status: She is alert and oriented to person, place, and time.  Psychiatric:        Mood and Affect: Mood normal.        Behavior: Behavior normal.        Thought Content: Thought content normal.        Judgment: Judgment normal.       Assessment & Plan:  1. Routine general medical examination at a health care facility Today patient counseled on age appropriate routine health concerns for screening and prevention, each reviewed and up to date or declined. Immunizations reviewed and up to date or declined. Labs ordered and reviewed. Risk factors for depression reviewed and negative. Hearing function and visual acuity are intact. ADLs screened and addressed as needed. Functional ability and level of safety reviewed and appropriate. Education,  counseling and referrals performed based on assessed risks today. Patient provided with a copy of personalized plan for preventive services.   2. Type 2 diabetes mellitus with diabetic neuropathy, without long-term current use of insulin - Will increase Mounjaro to 10 mg weekly; continue with Lantus and Metformin  - Follow up in 3 months  - CBC with Differential/Platelet; Future - Comprehensive metabolic panel; Future - Lipid panel; Future - TSH; Future - Hemoglobin A1c; Future - Microalbumin/Creatinine Ratio, Urine; Future  3. Essential hypertension - well controlled. No change in medication  - CBC with Differential/Platelet; Future - Comprehensive metabolic panel; Future - Lipid panel; Future - TSH; Future  4. Mixed hyperlipidemia - Consider increase in statin  - CBC with Differential/Platelet; Future - Comprehensive metabolic panel; Future - Lipid panel; Future - TSH; Future  5. Hypothyroidism, unspecified type - Consider increase in synthroid  - CBC with Differential/Platelet; Future - Comprehensive metabolic panel; Future - Lipid panel; Future - TSH; Future  6. Neuropathic pain - Continue Gabapentin  - CBC with Differential/Platelet; Future - Comprehensive metabolic panel; Future - Lipid panel; Future - TSH; Future  7. OAB (overactive bladder) - Continue Ditropan.  - CBC with Differential/Platelet; Future - Comprehensive metabolic panel; Future - Lipid panel; Future - TSH; Future  8. Gastroesophageal reflux disease without esophagitis - Continue PPI  - CBC with Differential/Platelet; Future - Comprehensive metabolic panel; Future - Lipid panel; Future - TSH; Future  9. Chronic pain syndrome - Per pain management  - CBC with Differential/Platelet; Future - Comprehensive metabolic panel; Future - Lipid panel; Future - TSH; Future  Shirline Frees, NP

## 2022-11-06 ENCOUNTER — Other Ambulatory Visit: Payer: Self-pay

## 2022-11-06 DIAGNOSIS — I10 Essential (primary) hypertension: Secondary | ICD-10-CM

## 2022-11-06 DIAGNOSIS — Z794 Long term (current) use of insulin: Secondary | ICD-10-CM

## 2022-11-19 DIAGNOSIS — Z96659 Presence of unspecified artificial knee joint: Secondary | ICD-10-CM | POA: Diagnosis not present

## 2022-11-19 DIAGNOSIS — G8929 Other chronic pain: Secondary | ICD-10-CM | POA: Diagnosis not present

## 2022-11-19 DIAGNOSIS — E1140 Type 2 diabetes mellitus with diabetic neuropathy, unspecified: Secondary | ICD-10-CM | POA: Diagnosis not present

## 2022-11-19 DIAGNOSIS — M25562 Pain in left knee: Secondary | ICD-10-CM | POA: Diagnosis not present

## 2022-11-19 DIAGNOSIS — M25569 Pain in unspecified knee: Secondary | ICD-10-CM | POA: Diagnosis not present

## 2022-11-26 DIAGNOSIS — G894 Chronic pain syndrome: Secondary | ICD-10-CM | POA: Diagnosis not present

## 2022-11-26 DIAGNOSIS — Z96651 Presence of right artificial knee joint: Secondary | ICD-10-CM | POA: Diagnosis not present

## 2022-11-26 DIAGNOSIS — M25561 Pain in right knee: Secondary | ICD-10-CM | POA: Diagnosis not present

## 2022-12-17 DIAGNOSIS — M25569 Pain in unspecified knee: Secondary | ICD-10-CM | POA: Diagnosis not present

## 2022-12-17 DIAGNOSIS — M25562 Pain in left knee: Secondary | ICD-10-CM | POA: Diagnosis not present

## 2022-12-17 DIAGNOSIS — E1140 Type 2 diabetes mellitus with diabetic neuropathy, unspecified: Secondary | ICD-10-CM | POA: Diagnosis not present

## 2022-12-17 DIAGNOSIS — Z96659 Presence of unspecified artificial knee joint: Secondary | ICD-10-CM | POA: Diagnosis not present

## 2022-12-17 DIAGNOSIS — G8929 Other chronic pain: Secondary | ICD-10-CM | POA: Diagnosis not present

## 2022-12-31 DIAGNOSIS — E119 Type 2 diabetes mellitus without complications: Secondary | ICD-10-CM | POA: Diagnosis not present

## 2023-01-03 ENCOUNTER — Other Ambulatory Visit (HOSPITAL_COMMUNITY): Payer: Self-pay

## 2023-01-07 ENCOUNTER — Other Ambulatory Visit (HOSPITAL_COMMUNITY): Payer: Self-pay

## 2023-01-08 ENCOUNTER — Other Ambulatory Visit (HOSPITAL_COMMUNITY): Payer: Self-pay

## 2023-01-14 DIAGNOSIS — E119 Type 2 diabetes mellitus without complications: Secondary | ICD-10-CM | POA: Diagnosis not present

## 2023-01-14 DIAGNOSIS — H53143 Visual discomfort, bilateral: Secondary | ICD-10-CM | POA: Diagnosis not present

## 2023-01-15 DIAGNOSIS — M25569 Pain in unspecified knee: Secondary | ICD-10-CM | POA: Diagnosis not present

## 2023-01-15 DIAGNOSIS — G894 Chronic pain syndrome: Secondary | ICD-10-CM | POA: Diagnosis not present

## 2023-01-15 DIAGNOSIS — E1140 Type 2 diabetes mellitus with diabetic neuropathy, unspecified: Secondary | ICD-10-CM | POA: Diagnosis not present

## 2023-01-15 DIAGNOSIS — G8929 Other chronic pain: Secondary | ICD-10-CM | POA: Diagnosis not present

## 2023-01-15 DIAGNOSIS — Z96659 Presence of unspecified artificial knee joint: Secondary | ICD-10-CM | POA: Diagnosis not present

## 2023-01-15 DIAGNOSIS — M25562 Pain in left knee: Secondary | ICD-10-CM | POA: Diagnosis not present

## 2023-01-17 ENCOUNTER — Other Ambulatory Visit (HOSPITAL_BASED_OUTPATIENT_CLINIC_OR_DEPARTMENT_OTHER): Payer: Self-pay

## 2023-01-17 ENCOUNTER — Other Ambulatory Visit (HOSPITAL_COMMUNITY): Payer: Self-pay

## 2023-01-20 ENCOUNTER — Other Ambulatory Visit (HOSPITAL_COMMUNITY): Payer: Self-pay

## 2023-02-04 ENCOUNTER — Ambulatory Visit: Payer: Medicare Other | Admitting: Adult Health

## 2023-02-10 ENCOUNTER — Other Ambulatory Visit: Payer: Self-pay | Admitting: Adult Health

## 2023-02-12 ENCOUNTER — Other Ambulatory Visit (HOSPITAL_COMMUNITY): Payer: Self-pay

## 2023-02-12 DIAGNOSIS — G8929 Other chronic pain: Secondary | ICD-10-CM | POA: Diagnosis not present

## 2023-02-12 DIAGNOSIS — M25569 Pain in unspecified knee: Secondary | ICD-10-CM | POA: Diagnosis not present

## 2023-02-12 DIAGNOSIS — M25562 Pain in left knee: Secondary | ICD-10-CM | POA: Diagnosis not present

## 2023-02-12 DIAGNOSIS — Z96659 Presence of unspecified artificial knee joint: Secondary | ICD-10-CM | POA: Diagnosis not present

## 2023-02-12 DIAGNOSIS — E1140 Type 2 diabetes mellitus with diabetic neuropathy, unspecified: Secondary | ICD-10-CM | POA: Diagnosis not present

## 2023-02-12 MED ORDER — MOUNJARO 10 MG/0.5ML ~~LOC~~ SOAJ
10.0000 mg | SUBCUTANEOUS | 0 refills | Status: DC
  Filled 2023-02-12: qty 6, 84d supply, fill #0

## 2023-02-13 ENCOUNTER — Other Ambulatory Visit (HOSPITAL_COMMUNITY): Payer: Self-pay

## 2023-02-19 ENCOUNTER — Telehealth: Payer: Self-pay

## 2023-02-19 ENCOUNTER — Ambulatory Visit (INDEPENDENT_AMBULATORY_CARE_PROVIDER_SITE_OTHER): Payer: Medicare Other | Admitting: Adult Health

## 2023-02-19 ENCOUNTER — Encounter: Payer: Self-pay | Admitting: Adult Health

## 2023-02-19 VITALS — BP 120/80 | HR 95 | Temp 98.3°F | Ht 61.0 in | Wt 173.0 lb

## 2023-02-19 DIAGNOSIS — R110 Nausea: Secondary | ICD-10-CM

## 2023-02-19 DIAGNOSIS — Z7985 Long-term (current) use of injectable non-insulin antidiabetic drugs: Secondary | ICD-10-CM

## 2023-02-19 DIAGNOSIS — Z7984 Long term (current) use of oral hypoglycemic drugs: Secondary | ICD-10-CM

## 2023-02-19 DIAGNOSIS — R1084 Generalized abdominal pain: Secondary | ICD-10-CM | POA: Diagnosis not present

## 2023-02-19 DIAGNOSIS — Z794 Long term (current) use of insulin: Secondary | ICD-10-CM

## 2023-02-19 DIAGNOSIS — R197 Diarrhea, unspecified: Secondary | ICD-10-CM

## 2023-02-19 DIAGNOSIS — I10 Essential (primary) hypertension: Secondary | ICD-10-CM

## 2023-02-19 DIAGNOSIS — E119 Type 2 diabetes mellitus without complications: Secondary | ICD-10-CM | POA: Diagnosis not present

## 2023-02-19 LAB — COMPREHENSIVE METABOLIC PANEL
ALT: 22 U/L (ref 0–35)
AST: 26 U/L (ref 0–37)
Albumin: 3.8 g/dL (ref 3.5–5.2)
Alkaline Phosphatase: 73 U/L (ref 39–117)
BUN: 4 mg/dL — ABNORMAL LOW (ref 6–23)
CO2: 32 mEq/L (ref 19–32)
Calcium: 8.9 mg/dL (ref 8.4–10.5)
Chloride: 95 mEq/L — ABNORMAL LOW (ref 96–112)
Creatinine, Ser: 0.77 mg/dL (ref 0.40–1.20)
GFR: 82.38 mL/min (ref >60.00)
Glucose, Bld: 211 mg/dL — ABNORMAL HIGH (ref 70–99)
Potassium: 2.7 mEq/L — CL (ref 3.5–5.1)
Sodium: 139 mEq/L (ref 135–145)
Total Bilirubin: 0.6 mg/dL (ref 0.2–1.2)
Total Protein: 6.8 g/dL (ref 6.0–8.3)

## 2023-02-19 LAB — POCT GLYCOSYLATED HEMOGLOBIN (HGB A1C): Hemoglobin A1C: 9.2 % — AB (ref 4.0–5.6)

## 2023-02-19 LAB — CBC WITH DIFFERENTIAL/PLATELET
Basophils Absolute: 0.1 10*3/uL (ref 0.0–0.1)
Basophils Relative: 0.5 % (ref 0.0–3.0)
Eosinophils Absolute: 0.2 10*3/uL (ref 0.0–0.7)
Eosinophils Relative: 1.4 % (ref 0.0–5.0)
HCT: 39.2 % (ref 36.0–46.0)
Hemoglobin: 13.1 g/dL (ref 12.0–15.0)
Lymphocytes Relative: 33.6 % (ref 12.0–46.0)
Lymphs Abs: 3.7 10*3/uL (ref 0.7–4.0)
MCHC: 33.4 g/dL (ref 30.0–36.0)
MCV: 94.1 fl (ref 78.0–100.0)
Monocytes Absolute: 1 10*3/uL (ref 0.1–1.0)
Monocytes Relative: 8.6 % (ref 3.0–12.0)
Neutro Abs: 6.2 10*3/uL (ref 1.4–7.7)
Neutrophils Relative %: 55.9 % (ref 43.0–77.0)
Platelets: 325 10*3/uL (ref 150.0–400.0)
RBC: 4.16 Mil/uL (ref 3.87–5.11)
RDW: 13.5 % (ref 11.5–15.5)
WBC: 11.1 10*3/uL — ABNORMAL HIGH (ref 4.0–10.5)

## 2023-02-19 LAB — H. PYLORI ANTIBODY, IGG: H Pylori IgG: NEGATIVE

## 2023-02-19 MED ORDER — ONDANSETRON HCL 4 MG PO TABS: 4.0000 mg | ORAL_TABLET | Freq: Three times a day (TID) | ORAL | 2 refills | Status: AC | PRN

## 2023-02-19 MED ORDER — POTASSIUM CHLORIDE ER 10 MEQ PO TBCR: 10.0000 meq | EXTENDED_RELEASE_TABLET | Freq: Two times a day (BID) | ORAL | 0 refills | Status: DC

## 2023-02-19 NOTE — Progress Notes (Signed)
Subjective:    Patient ID: Alexandra [name], female    DOB: 03-Sep-1960, 62 y.o.   MRN: [id number]  HPI 62 year old female who  has a past medical history of Allergy, Anemia, Blood transfusion without reported diagnosis, Diabetes mellitus (HCC), Fatty liver, GERD (gastroesophageal reflux disease), Hiatal hernia (07/29/1996), Hyperlipidemia, Hyperplastic colon polyp, Hypertension, Hypothyroidism, Neuromuscular disorder (HCC), Rotator cuff tear, right, and Stroke (HCC).  She presents to the office today for three month follow up regarding DM and HTN   Dabetes mellitus type II-managed with Mounjaro 10 mg weekly, metformin 1000 mg daily, and Lantus 23 units daily.  She uses the Holland sensor to manage her blood sugars. She has been in target 43 % of the time with the remaining time being elevated. No hypoglycemia. Her average over 90 days has been 275 . She had been out of [facility] for about 2 weeks. She continues to drink multiple soft drinks a day.  Lab Results  Component Value Date   HGBA1C 8.0 (H) 11/05/2022   Hypertension - managed with Ziac 10-6.25  mg daily.  BP Readings from Last 3 Encounters:  02/19/23 120/80  11/05/22 130/80  08/29/22 110/70   Wt Readings from Last 3 Encounters:  02/19/23 173 lb (78.5 kg)  11/05/22 177 lb (80.3 kg)  08/29/22 176 lb (79.8 kg)   Diarrhea and nausea- has been going on for multiple months. Started before GLP therapy. Reports " every time I eat something I get diarrhea and  become nauseated". She does not  have any vomiting. She does have abdominal pain on the left flank but will experience generalized abdominal cramping after eating. She has been taking her PPI. She has history of cholecystectomy. She has not been on any abx therapy recently. Does not have fevers or chills. Denies overt symptoms of UTI   Review of Systems See HPI   Past Medical History:  Diagnosis Date   Allergy    Anemia    past hx   Blood transfusion without reported  diagnosis    Diabetes mellitus (HCC)    Fatty liver    GERD (gastroesophageal reflux disease)    prn med.   Hiatal hernia 07/29/1996   Hyperlipidemia    Hyperplastic colon polyp    Hypertension    new dx. - will start med. 06/28/2011   Hypothyroidism    Neuromuscular disorder (HCC)    hemiplegia , neuropathy   Rotator cuff tear, right    Stroke [facility])    TIA 2016    Social History   Socioeconomic History   Marital status: Married    Spouse name: Not on file   Number of children: Not on file   Years of education: Not on file   Highest education level: Bachelor's degree (e.g., BA, AB, BS)  Occupational History   Occupation: unemployed  Tobacco Use   Smoking status: Former    Current packs/day: 0.00    Average packs/day: 1 pack/day for 10.0 years (10.0 ttl pk-yrs)    Types: Cigarettes    Start date: 07/29/1974    Quit date: 07/29/1984    Years since quitting: 38.5   Smokeless tobacco: Never  Vaping Use   Vaping status: Never Used  Substance and Sexual Activity   Alcohol use: Yes    Comment: occassionally   Drug use: No   Sexual activity: Yes  Other Topics Concern   Not on file  Social History Narrative   Married for 32 years  Six children- all live around here. Two daughters live with her   10 grand children    On Disability for mental issues   No pets   Likes to go to movies and out to dinner. She likes to do puzzle books. Likes to read      She eats a healthy diet   She does not exercise    Lives at home with husband and granddaughter   Caffeine: 4 cups/day   Social Determinants of Health   Financial Resource Strain: Low Risk  (03/08/2022)   Overall Financial Resource Strain (CARDIA)    Difficulty of Paying Living Expenses: Not hard at all  Food Insecurity: No Food Insecurity (05/08/2022)   Hunger Vital Sign    Worried About Running Out of Food in the Last Year: Never true    Ran Out of Food in the Last Year: Never true  Transportation Needs: No  Transportation Needs (05/08/2022)   PRAPARE - Administrator, Civil Service (Medical): No    Lack of Transportation (Non-Medical): No  Physical Activity: Inactive (03/08/2022)   Exercise Vital Sign    Days of Exercise per Week: 0 days    Minutes of Exercise per Session: 0 min  Stress: No Stress Concern Present (03/08/2022)   [employer] of Occupational Health - Occupational Stress Questionnaire    Feeling of Stress : Not at all  Social Connections: Unknown (03/08/2022)   Social Connection and Isolation Panel [NHANES]    Frequency of Communication with Friends and Family: More than three times a week    Frequency of Social Gatherings with Friends and Family: More than three times a week    Attends Religious Services: More than 4 times per year    Active Member of Clubs or Organizations: Yes    Attends Banker Meetings: More than 4 times per year    Marital Status: Not on file  Intimate Partner Violence: Not At Risk (03/08/2022)   Humiliation, Afraid, Rape, and Kick questionnaire    Fear of Current or Ex-Partner: No    Emotionally Abused: No    Physically Abused: No    Sexually Abused: No    Past Surgical History:  Procedure Laterality Date   ABDOMINAL HYSTERECTOMY  06/25/2000   left salpingo-oophorectomy   BACK SURGERY  07/30/1995   discetomy, lumbar spinal fusion   CARDIAC CATHETERIZATION  04/01/2006   CESAREAN SECTION  07/29/1989   CHOLECYSTECTOMY  07/30/1987   COLONOSCOPY     EXPLORATORY LAPAROTOMY  05/08/2000   right salpingo-oophorectomy   LAMINOTOMY / EXCISION DISK POSTERIOR CERVICAL SPINE  08/10/2009   C7-T1   MENISCUS REPAIR Right 04/2021   Knee   POLYPECTOMY     [facility] 2012   ROUX-EN-Y GASTRIC BYPASS  05/19/2007   SHOULDER ARTHROSCOPY  01/02/2006; 05/31/2004   left 2007; right 2005   SHOULDER ARTHROSCOPY  07/02/2011   Procedure: ARTHROSCOPY SHOULDER;  Surgeon: Wyn Forster., MD;  Location: [facility];  Service:  Orthopedics;  Laterality: Right;  repair supraspinatus, Debride glenohumeral joint, labrum   SHOULDER ARTHROSCOPY DISTAL CLAVICLE EXCISION AND OPEN ROTATOR CUFF REPAIR  07/26/2010   right   THYROID SURGERY  early 2000s   goiter   TOTAL 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/2021   Procedure: TOTAL KNEE ARTHROPLASTY;  Surgeon: Sheral Apley, MD;  Location: [facility];  Service: Orthopedics;  Laterality: Right;   TUBAL LIGATION  1993    Family History  Problem Relation Age of Onset  Diabetes Mother    Hyperlipidemia Mother    Hypertension Mother    Thyroid disease Mother    Rheum arthritis Mother    COPD Mother    Cancer Paternal Grandfather        Prostate Cancer   Coronary artery disease Other    Colon cancer Neg Hx    Colon polyps Neg Hx    Esophageal cancer Neg Hx    Rectal cancer Neg Hx    Stomach cancer Neg Hx    Pancreatic cancer Neg Hx    Sleep apnea Neg Hx     Allergies  Allergen Reactions   Shrimp (Diagnostic) Anaphylaxis    Eating shrimp causes swelling of tongue and rash   Ace Inhibitors Cough    Current Outpatient Medications on File Prior to Visit  Medication Sig Dispense Refill   acetaminophen (TYLENOL) 500 MG tablet Take 2 tablets (1,000 mg total) by mouth every 6 (six) hours as needed for mild pain or moderate pain. 60 tablet 0   albuterol (VENTOLIN HFA) 108 (90 Base) MCG/ACT inhaler TAKE 2 PUFFS BY MOUTH EVERY 6 HOURS AS NEEDED FOR WHEEZE OR SHORTNESS OF BREATH 18 each 1   B Complex Vitamins (B COMPLEX PO) Take 1 tablet by mouth daily.     BIOTIN PO Take 1 tablet by mouth daily.     bisoprolol-hydrochlorothiazide (ZIAC) 10-6.25 MG tablet TAKE 1 TABLET BY MOUTH DAILY 100 tablet 2   Blood Glucose Monitoring Suppl (ONETOUCH VERIO) w/Device KIT 1 Device by Does not apply route daily. Use to check blood glucose 4 times day or prn 1 kit 0   Cholecalciferol (VITAMIN D3 PO) Take 1 tablet by mouth daily.     Continuous Blood Gluc Sensor (FREESTYLE LIBRE 3 SENSOR) MISC 1  Device by Does not apply route every 14 (fourteen) days. Place 1 sensor on the skin every 14 days. Use to check glucose continuously 6 each 3   diclofenac Sodium (VOLTAREN) 1 % GEL 2 g 3 (three) times daily as needed (pain).     DULoxetine (CYMBALTA) 30 MG capsule Take 30 mg by mouth every morning.     ferrous sulfate 325 (65 FE) MG EC tablet Take 325 mg by mouth daily with breakfast.     fluticasone (FLONASE) 50 MCG/ACT nasal spray USE 2 SPRAYS IN EACH NOSTRIL DAILY (Patient taking differently: 2 sprays daily as needed for allergies or rhinitis.) 48 g 3   gabapentin (NEURONTIN) 400 MG capsule TAKE 2 CAPSULES BY MOUTH 3 TIMES DAILY 600 capsule 2   glucose blood (ONETOUCH VERIO) test strip USE TO CHECK BLOOD SUGAR 4 TIMES DAILY OR AS NEEDED 400 strip 2   HYDROcodone-acetaminophen (NORCO/VICODIN) 5-325 MG tablet Take 1 tablet by mouth every 8 (eight) hours as needed for moderate pain.     Insulin Pen Needle (B-D UF III MINI PEN NEEDLES) 31G X 5 MM MISC USE WITH INSULIN PEN AS DIRECTED 100 each 3   Lancet Devices MISC Use to check blood sugar TID 100 each 2   levothyroxine (SYNTHROID) 112 MCG tablet TAKE 1 TABLET BY MOUTH DAILY 100 tablet 2   meloxicam (MOBIC) 7.5 MG tablet Take 7.5 mg by mouth daily.     metFORMIN (GLUCOPHAGE) 1000 MG tablet TAKE 1 TABLET BY MOUTH DAILY  WITH BREAKFAST 100 tablet 2   ondansetron (ZOFRAN-ODT) 4 MG disintegrating tablet Take 1 tablet (4 mg total) by mouth 2 (two) times daily as needed for nausea or vomiting. 10 tablet 0  ONE TOUCH LANCETS MISC Test blood sugars once daily Dx: E11.9 100 each 3   oxybutynin (DITROPAN-XL) 5 MG 24 hr tablet TAKE 1 TABLET BY MOUTH AT  BEDTIME 100 tablet 2   pantoprazole (PROTONIX) 40 MG tablet TAKE 1 TABLET BY MOUTH DAILY 100 tablet 2   risperiDONE (RISPERDAL) 1 MG tablet Take 1 mg by mouth 2 (two) times daily.     simvastatin (ZOCOR) 20 MG tablet TAKE 1 TABLET BY MOUTH AT  BEDTIME 100 tablet 2   tirzepatide (MOUNJARO) 10 MG/0.5ML Pen  Inject 10 mg into the skin once a week. 6 mL 0   traZODone (DESYREL) 100 MG tablet Take 200 mg by mouth at bedtime.     VITAMIN E PO Take 1 capsule by mouth daily.     insulin glargine (LANTUS SOLOSTAR) 100 UNIT/ML Solostar Pen INJECT 20 UNITS INTO THE SKIN DAILY 90 mL 0   No current facility-administered medications on file prior to visit.    BP 120/80   Pulse 95   Temp 98.3 F (36.8 C) (Oral)   Ht 5\' 1"  (1.549 m)   Wt 173 lb (78.5 kg)   SpO2 95%   BMI 32.69 kg/m       Objective:   Physical Exam Vitals and nursing note reviewed.  Constitutional:      Appearance: Normal appearance. She is obese.  Cardiovascular:     Rate and Rhythm: Normal rate and regular rhythm.     Pulses: Normal pulses.     Heart sounds: Normal heart sounds.  Pulmonary:     Effort: Pulmonary effort is normal.     Breath sounds: Normal breath sounds.  Abdominal:     General: Abdomen is flat. Bowel sounds are normal. There is no distension.     Palpations: Abdomen is soft.     Tenderness: There is abdominal tenderness in the right upper quadrant, epigastric area and left upper quadrant. There is no right CVA tenderness or left CVA tenderness.     Hernia: No hernia is present.  Skin:    General: Skin is warm and dry.  Neurological:     General: No focal deficit present.     Mental Status: She is alert and oriented to person, place, and time.  Psychiatric:        Mood and Affect: Mood normal.        Behavior: Behavior normal.        Thought Content: Thought content normal.        Judgment: Judgment normal.        Assessment & Plan:  1. Type 2 diabetes mellitus with complication, with long-term current use of insulin (HCC)  - POC HgB A1c- 9.2  - She is starting Mounjaro 10 mg this week - Will have her increase Lantus to 30 units daily  - Needs to lay off pepsi and sugars   2. Diabetes mellitus treated with oral medication (HCC)  POC HgB A1c  3. Long-term current use of injectable  noninsulin antidiabetic medication  - POC HgB A1c  4. Essential hypertension - Controlled.   5. Nausea  - ondansetron (ZOFRAN) 4 MG tablet; Take 1 tablet (4 mg total) by mouth every 8 (eight) hours as needed for nausea or vomiting.  Dispense: 20 tablet; Refill: 2  6. Diarrhea, unspecified type  - Stool culture; Future  7. Generalized abdominal pain - Unknown cause at this time.Does not seem to be GLP related or metformin as she has been on metformin  for many years without issue. Will check labs  - Consider CT in the future  - CBC with Differential/Platelet; Future - Comprehensive metabolic panel; Future - H. pylori Antibody, IgG; Future - Urinalysis; Future  Shirline Frees, NP

## 2023-02-19 NOTE — Telephone Encounter (Signed)
Call in Klor-con 10 mEq to take one tablet BID, #60 with no refills. Tell her to take 2 tablets tonight for starters

## 2023-02-19 NOTE — Telephone Encounter (Signed)
Spoke to pt and advise pt to take the rx sent by Dr. Clent Ridges for her potassium level.   Pt verbalized understanding.

## 2023-02-19 NOTE — Telephone Encounter (Signed)
Rx sent 

## 2023-02-19 NOTE — Telephone Encounter (Signed)
CRITICAL VALUE STICKER  CRITICAL VALUE: Potassium 2.7  RECEIVER (on-site recipient of call):  DATE & TIME NOTIFIED:   MESSENGER (representative from lab): Saa from [facility] lab  MD NOTIFIED: Dr. Clent Ridges  TIME OF NOTIFICATION: 4:58pm   RESPONSE:

## 2023-02-19 NOTE — Patient Instructions (Signed)
Health Maintenance Due  Topic Date Due   OPHTHALMOLOGY EXAM  01/23/2022   COVID-19 Vaccine (6 - 2023-24 season) 07/01/2022   MAMMOGRAM  02/18/2023   Medicare Annual Wellness (AWV)  03/09/2023       05/03/2022    8:14 AM 03/08/2022    3:40 PM 08/02/2021   10:52 AM  Depression screen PHQ 2/9  Decreased Interest 0 0 0  Down, Depressed, Hopeless 0 0 0  PHQ - 2 Score 0 0 0  Altered sleeping 0    Tired, decreased energy 0    Change in appetite 0    Feeling bad or failure about yourself  0    Trouble concentrating 0    Moving slowly or fidgety/restless 0    Suicidal thoughts 0    PHQ-9 Score 0    Difficult doing work/chores Not difficult at all

## 2023-02-20 ENCOUNTER — Other Ambulatory Visit: Payer: Self-pay | Admitting: Adult Health

## 2023-02-20 LAB — URINALYSIS
Bilirubin Urine: NEGATIVE
Hgb urine dipstick: NEGATIVE
Leukocytes,Ua: NEGATIVE
Specific Gravity, Urine: 1.025 (ref 1.000–1.030)
Urine Glucose: 500 — AB
pH: 5.5 (ref 5.0–8.0)

## 2023-02-21 ENCOUNTER — Other Ambulatory Visit: Payer: Self-pay | Admitting: Adult Health

## 2023-02-21 NOTE — Telephone Encounter (Signed)
  The original prescription was discontinued on 08/02/2022 by Shirline Frees, NP. Renewing this prescription may not be appropriate.

## 2023-02-25 DIAGNOSIS — R197 Diarrhea, unspecified: Secondary | ICD-10-CM | POA: Diagnosis not present

## 2023-02-26 ENCOUNTER — Encounter (INDEPENDENT_AMBULATORY_CARE_PROVIDER_SITE_OTHER): Payer: Self-pay

## 2023-03-06 ENCOUNTER — Other Ambulatory Visit: Payer: Self-pay | Admitting: Adult Health

## 2023-03-06 DIAGNOSIS — R197 Diarrhea, unspecified: Secondary | ICD-10-CM

## 2023-03-06 DIAGNOSIS — R1084 Generalized abdominal pain: Secondary | ICD-10-CM

## 2023-03-06 MED ORDER — DICYCLOMINE HCL 10 MG PO CAPS: 10.0000 mg | ORAL_CAPSULE | Freq: Three times a day (TID) | ORAL | 0 refills | Status: DC

## 2023-03-11 ENCOUNTER — Ambulatory Visit (HOSPITAL_BASED_OUTPATIENT_CLINIC_OR_DEPARTMENT_OTHER)
Admission: RE | Admit: 2023-03-11 | Discharge: 2023-03-11 | Disposition: A | Discharge status: Home / Self Care | Payer: Medicare Other | Source: Ambulatory Visit | Attending: Adult Health | Admitting: Adult Health

## 2023-03-11 DIAGNOSIS — R1084 Generalized abdominal pain: Secondary | ICD-10-CM | POA: Diagnosis not present

## 2023-03-11 DIAGNOSIS — R197 Diarrhea, unspecified: Secondary | ICD-10-CM | POA: Diagnosis not present

## 2023-03-11 DIAGNOSIS — R109 Unspecified abdominal pain: Secondary | ICD-10-CM | POA: Diagnosis not present

## 2023-03-11 MED ORDER — IOHEXOL 300 MG/ML  SOLN
100.0000 mL | Freq: Once | INTRAMUSCULAR | Status: AC | PRN
  Administered 2023-03-11: 85 mL via INTRAVENOUS

## 2023-03-13 ENCOUNTER — Other Ambulatory Visit: Payer: Self-pay | Admitting: Family Medicine

## 2023-03-13 ENCOUNTER — Other Ambulatory Visit: Payer: Self-pay | Admitting: Adult Health

## 2023-03-13 DIAGNOSIS — E876 Hypokalemia: Secondary | ICD-10-CM

## 2023-03-14 ENCOUNTER — Ambulatory Visit (INDEPENDENT_AMBULATORY_CARE_PROVIDER_SITE_OTHER): Payer: Medicare Other

## 2023-03-14 ENCOUNTER — Encounter: Payer: Self-pay | Admitting: Adult Health

## 2023-03-14 VITALS — Ht 61.5 in | Wt 173.0 lb

## 2023-03-14 DIAGNOSIS — Z1231 Encounter for screening mammogram for malignant neoplasm of breast: Secondary | ICD-10-CM

## 2023-03-14 DIAGNOSIS — Z0000 Encounter for general adult medical examination without abnormal findings: Secondary | ICD-10-CM

## 2023-03-14 NOTE — Patient Instructions (Addendum)
Alexandra [name] , Thank you for taking time to come for your Medicare Wellness Visit. I appreciate your ongoing commitment to your health goals. Please review the following plan we discussed and let me know if I can assist you in the future.   Referrals/Orders/Follow-Ups/Clinician Recommendations:    This is a list of the screening recommended for you and due dates:  Health Maintenance  Topic Date Due   COVID-19 Vaccine (6 - 2023-24 season) 07/01/2022   Mammogram  02/18/2023   Flu Shot  02/27/2023   Hemoglobin A1C  08/22/2023   Yearly kidney health urinalysis for diabetes  11/05/2023   Complete foot exam   11/05/2023   Eye exam for diabetics  01/14/2024   Yearly kidney function blood test for diabetes  02/19/2024   Medicare Annual Wellness Visit  03/13/2024   DTaP/Tdap/Td vaccine (4 - Td or Tdap) 03/15/2025   Colon Cancer Screening  03/16/2028   Hepatitis C Screening  Completed   HIV Screening  Completed   Zoster (Shingles) Vaccine  Completed   HPV Vaccine  Aged Out    Advanced directives: (Declined) Advance directive discussed with you today. Even though you declined this today, please call our office should you change your mind, and we can give you the proper paperwork for you to fill out.  Next Medicare Annual Wellness Visit scheduled for next year: Yes  Preventive Care 40-64 Years, Female Preventive care refers to lifestyle choices and visits with your health care provider that can promote health and wellness. What does preventive care include? A yearly physical exam. This is also called an annual well check. Dental exams once or twice a year. Routine eye exams. Ask your health care provider how often you should have your eyes checked. Personal lifestyle choices, including: Daily care of your teeth and gums. Regular physical activity. Eating a healthy diet. Avoiding tobacco and drug use. Limiting alcohol use. Practicing safe sex. Taking low-dose aspirin daily starting at age  94. Taking vitamin and mineral supplements as recommended by your health care provider. What happens during an annual well check? The services and screenings done by your health care provider during your annual well check will depend on your age, overall health, lifestyle risk factors, and family history of disease. Counseling  Your health care provider may ask you questions about your: Alcohol use. Tobacco use. Drug use. Emotional well-being. Home and relationship well-being. Sexual activity. Eating habits. Work and work Astronomer. Method of birth control. Menstrual cycle. Pregnancy history. Screening  You may have the following tests or measurements: Height, weight, and BMI. Blood pressure. Lipid and cholesterol levels. These may be checked every 5 years, or more frequently if you are over 43 years old. Skin check. Lung cancer screening. You may have this screening every year starting at age 70 if you have a 30-pack-year history of smoking and currently smoke or have quit within the past 15 years. Fecal occult blood test (FOBT) of the stool. You may have this test every year starting at age 74. Flexible sigmoidoscopy or colonoscopy. You may have a sigmoidoscopy every 5 years or a colonoscopy every 10 years starting at age 38. Hepatitis C blood test. Hepatitis B blood test. Sexually transmitted disease (STD) testing. Diabetes screening. This is done by checking your blood sugar (glucose) after you have not eaten for a while (fasting). You may have this done every 1-3 years. Mammogram. This may be done every 1-2 years. Talk to your health care provider about when you should  start having regular mammograms. This may depend on whether you have a family history of breast cancer. BRCA-related cancer screening. This may be done if you have a family history of breast, ovarian, tubal, or peritoneal cancers. Pelvic exam and Pap test. This may be done every 3 years starting at age 16.  Starting at age 82, this may be done every 5 years if you have a Pap test in combination with an HPV test. Bone density scan. This is done to screen for osteoporosis. You may have this scan if you are at high risk for osteoporosis. Discuss your test results, treatment options, and if necessary, the need for more tests with your health care provider. Vaccines  Your health care provider may recommend certain vaccines, such as: Influenza vaccine. This is recommended every year. Tetanus, diphtheria, and acellular pertussis (Tdap, Td) vaccine. You may need a Td booster every 10 years. Zoster vaccine. You may need this after age 64. Pneumococcal 13-valent conjugate (PCV13) vaccine. You may need this if you have certain conditions and were not previously vaccinated. Pneumococcal polysaccharide (PPSV23) vaccine. You may need one or two doses if you smoke cigarettes or if you have certain conditions. Talk to your health care provider about which screenings and vaccines you need and how often you need them. This information is not intended to replace advice given to you by your health care provider. Make sure you discuss any questions you have with your health care provider. Document Released: 08/11/2015 Document Revised: 04/03/2016 Document Reviewed: 05/16/2015 Elsevier Interactive Patient Education  2017 [employer].    Fall Prevention in the Home Falls can cause injuries. They can happen to people of all ages. There are many things you can do to make your home safe and to help prevent falls. What can I do on the outside of my home? Regularly fix the edges of walkways and driveways and fix any cracks. Remove anything that might make you trip as you walk through a door, such as a raised step or threshold. Trim any bushes or trees on the path to your home. Use bright outdoor lighting. Clear any walking paths of anything that might make someone trip, such as rocks or tools. Regularly check to see if  handrails are loose or broken. Make sure that both sides of any steps have handrails. Any raised decks and porches should have guardrails on the edges. Have any leaves, snow, or ice cleared regularly. Use sand or salt on walking paths during winter. Clean up any spills in your garage right away. This includes oil or grease spills. What can I do in the bathroom? Use night lights. Install grab bars by the toilet and in the tub and shower. Do not use towel bars as grab bars. Use non-skid mats or decals in the tub or shower. If you need to sit down in the shower, use a plastic, non-slip stool. Keep the floor dry. Clean up any water that spills on the floor as soon as it happens. Remove soap buildup in the tub or shower regularly. Attach bath mats securely with double-sided non-slip rug tape. Do not have throw rugs and other things on the floor that can make you trip. What can I do in the bedroom? Use night lights. Make sure that you have a light by your bed that is easy to reach. Do not use any sheets or blankets that are too big for your bed. They should not hang down onto the floor. Have a firm chair  that has side arms. You can use this for support while you get dressed. Do not have throw rugs and other things on the floor that can make you trip. What can I do in the kitchen? Clean up any spills right away. Avoid walking on wet floors. Keep items that you use a lot in easy-to-reach places. If you need to reach something above you, use a strong step stool that has a grab bar. Keep electrical cords out of the way. Do not use floor polish or wax that makes floors slippery. If you must use wax, use non-skid floor wax. Do not have throw rugs and other things on the floor that can make you trip. What can I do with my stairs? Do not leave any items on the stairs. Make sure that there are handrails on both sides of the stairs and use them. Fix handrails that are broken or loose. Make sure that  handrails are as long as the stairways. Check any carpeting to make sure that it is firmly attached to the stairs. Fix any carpet that is loose or worn. Avoid having throw rugs at the top or bottom of the stairs. If you do have throw rugs, attach them to the floor with carpet tape. Make sure that you have a light switch at the top of the stairs and the bottom of the stairs. If you do not have them, ask someone to add them for you. What else can I do to help prevent falls? Wear shoes that: Do not have high heels. Have rubber bottoms. Are comfortable and fit you well. Are closed at the toe. Do not wear sandals. If you use a stepladder: Make sure that it is fully opened. Do not climb a closed stepladder. Make sure that both sides of the stepladder are locked into place. Ask someone to hold it for you, if possible. Clearly mark and make sure that you can see: Any grab bars or handrails. First and last steps. Where the edge of each step is. Use tools that help you move around (mobility aids) if they are needed. These include: Canes. Walkers. Scooters. Crutches. Turn on the lights when you go into a dark area. Replace any light bulbs as soon as they burn out. Set up your furniture so you have a clear path. Avoid moving your furniture around. If any of your floors are uneven, fix them. If there are any pets around you, be aware of where they are. Review your medicines with your doctor. Some medicines can make you feel dizzy. This can increase your chance of falling. Ask your doctor what other things that you can do to help prevent falls. This information is not intended to replace advice given to you by your health care provider. Make sure you discuss any questions you have with your health care provider. Document Released: 05/11/2009 Document Revised: 12/21/2015 Document Reviewed: 08/19/2014 Elsevier Interactive Patient Education  2017 [employer].

## 2023-03-14 NOTE — Progress Notes (Signed)
Subjective:   Alexandra [name] is a 62 y.o. female who presents for Medicare Annual (Subsequent) preventive examination.  Visit Complete: Virtual  I connected with  Alexandra [name] on 03/14/23 by a audio enabled telemedicine application and verified that I am speaking with the correct person using two identifiers.  Patient Location: Home  Provider Location: Home Office  I discussed the limitations of evaluation and management by telemedicine. The patient expressed understanding and agreed to proceed.  Patient Medicare AWV questionnaire was completed by the patient on 03/03/23; I have confirmed that all information answered by patient is correct and no changes since this date.  Review of Systems    Vital Signs: Unable to obtain new vitals due to this being a telehealth visit.  Cardiac Risk Factors include: advanced age (>18men, >13 women);diabetes mellitus;hypertension     Objective:    Today's Vitals   03/14/23 0924  Weight: 173 lb (78.5 kg)  Height: 5' 1.5" (1.562 m)   Body mass index is 32.16 kg/m.     03/14/2023    9:33 AM 06/03/2022    3:51 PM 06/03/2022    3:31 PM 03/08/2022    3:43 PM 11/14/2021   12:25 AM 10/31/2021    2:26 PM 03/16/2021    7:14 AM  Advanced Directives  Does Patient Have a Medical Advance Directive? No No Unable to assess, patient is non-responsive or altered mental status No No No No  Would patient like information on creating a medical advance directive? No - Patient declined No - Patient declined  No - Patient declined No - Patient declined No - Patient declined No - Patient declined    Current Medications (verified) Outpatient Encounter Medications as of 03/14/2023  Medication Sig   acetaminophen (TYLENOL) 500 MG tablet Take 2 tablets (1,000 mg total) by mouth every 6 (six) hours as needed for mild pain or moderate pain.   albuterol (VENTOLIN HFA) 108 (90 Base) MCG/ACT inhaler TAKE 2 PUFFS BY MOUTH EVERY 6 HOURS AS NEEDED FOR WHEEZE OR  SHORTNESS OF BREATH   B Complex Vitamins (B COMPLEX PO) Take 1 tablet by mouth daily.   BIOTIN PO Take 1 tablet by mouth daily.   bisoprolol-hydrochlorothiazide (ZIAC) 10-6.25 MG tablet TAKE 1 TABLET BY MOUTH DAILY   Blood Glucose Monitoring Suppl (ONETOUCH VERIO) w/Device KIT 1 Device by Does not apply route daily. Use to check blood glucose 4 times day or prn   Cholecalciferol (VITAMIN D3 PO) Take 1 tablet by mouth daily.   Continuous Blood Gluc Sensor (FREESTYLE LIBRE 3 SENSOR) MISC 1 Device by Does not apply route every 14 (fourteen) days. Place 1 sensor on the skin every 14 days. Use to check glucose continuously   diclofenac Sodium (VOLTAREN) 1 % GEL 2 g 3 (three) times daily as needed (pain).   dicyclomine (BENTYL) 10 MG capsule Take 1 capsule (10 mg total) by mouth 4 (four) times daily -  before meals and at bedtime for 10 days.   DULoxetine (CYMBALTA) 30 MG capsule Take 30 mg by mouth every morning.   ferrous sulfate 325 (65 FE) MG EC tablet Take 325 mg by mouth daily with breakfast.   fluticasone (FLONASE) 50 MCG/ACT nasal spray USE 2 SPRAYS IN EACH NOSTRIL DAILY (Patient taking differently: 2 sprays daily as needed for allergies or rhinitis.)   gabapentin (NEURONTIN) 400 MG capsule TAKE 2 CAPSULES BY MOUTH 3 TIMES DAILY   glucose blood (ONETOUCH VERIO) test strip USE TO CHECK BLOOD SUGAR 4  TIMES DAILY OR AS NEEDED   HYDROcodone-acetaminophen (NORCO/VICODIN) 5-325 MG tablet Take 1 tablet by mouth every 8 (eight) hours as needed for moderate pain.   insulin glargine (LANTUS SOLOSTAR) 100 UNIT/ML Solostar Pen INJECT 20 UNITS INTO THE SKIN DAILY (Patient taking differently: Inject 30 Units into the skin daily.)   Insulin Pen Needle (B-D UF III MINI PEN NEEDLES) 31G X 5 MM MISC USE WITH INSULIN PEN AS DIRECTED   Lancet Devices MISC Use to check blood sugar TID   levothyroxine (SYNTHROID) 112 MCG tablet TAKE 1 TABLET BY MOUTH DAILY   meloxicam (MOBIC) 7.5 MG tablet Take 7.5 mg by mouth daily.    metFORMIN (GLUCOPHAGE) 1000 MG tablet TAKE 1 TABLET BY MOUTH DAILY  WITH BREAKFAST   ondansetron (ZOFRAN) 4 MG tablet Take 1 tablet (4 mg total) by mouth every 8 (eight) hours as needed for nausea or vomiting.   ondansetron (ZOFRAN-ODT) 4 MG disintegrating tablet Take 1 tablet (4 mg total) by mouth 2 (two) times daily as needed for nausea or vomiting.   ONE TOUCH LANCETS MISC Test blood sugars once daily Dx: E11.9   oxybutynin (DITROPAN-XL) 5 MG 24 hr tablet TAKE 1 TABLET BY MOUTH AT  BEDTIME   pantoprazole (PROTONIX) 40 MG tablet TAKE 1 TABLET BY MOUTH DAILY   potassium chloride (KLOR-CON 10) 10 MEQ tablet Take 1 tablet (10 mEq total) by mouth 2 (two) times daily.   risperiDONE (RISPERDAL) 1 MG tablet Take 1 mg by mouth 2 (two) times daily.   simvastatin (ZOCOR) 20 MG tablet TAKE 1 TABLET BY MOUTH AT  BEDTIME   tirzepatide (MOUNJARO) 10 MG/0.5ML Pen Inject 10 mg into the skin once a week.   traZODone (DESYREL) 100 MG tablet Take 200 mg by mouth at bedtime.   VITAMIN E PO Take 1 capsule by mouth daily.   No facility-administered encounter medications on file as of 03/14/2023.    Allergies (verified) Shrimp (diagnostic) and Ace inhibitors   History: Past Medical History:  Diagnosis Date   Allergy    Anemia    past hx   Blood transfusion without reported diagnosis    Diabetes mellitus (HCC)    Fatty liver    GERD (gastroesophageal reflux disease)    prn med.   Hiatal hernia 07/29/1996   Hyperlipidemia    Hyperplastic colon polyp    Hypertension    new dx. - will start med. 06/28/2011   Hypothyroidism    Neuromuscular disorder (HCC)    hemiplegia , neuropathy   Rotator cuff tear, right    Stroke [facility])    TIA 2016   Past Surgical History:  Procedure Laterality Date   ABDOMINAL HYSTERECTOMY  06/25/2000   left salpingo-oophorectomy   BACK SURGERY  07/30/1995   discetomy, lumbar spinal fusion   CARDIAC CATHETERIZATION  04/01/2006   CESAREAN SECTION  07/29/1989    CHOLECYSTECTOMY  07/30/1987   COLONOSCOPY     EXPLORATORY LAPAROTOMY  05/08/2000   right salpingo-oophorectomy   LAMINOTOMY / EXCISION DISK POSTERIOR CERVICAL SPINE  08/10/2009   C7-T1   MENISCUS REPAIR Right 04/2021   Knee   POLYPECTOMY     [facility] 2012   ROUX-EN-Y GASTRIC BYPASS  05/19/2007   SHOULDER ARTHROSCOPY  01/02/2006; 05/31/2004   left 2007; right 2005   SHOULDER ARTHROSCOPY  07/02/2011   Procedure: ARTHROSCOPY SHOULDER;  Surgeon: Wyn Forster., MD;  Location: [facility];  Service: Orthopedics;  Laterality: Right;  repair supraspinatus, Debride glenohumeral joint, labrum  SHOULDER ARTHROSCOPY DISTAL CLAVICLE EXCISION AND OPEN ROTATOR CUFF REPAIR  07/26/2010   right   THYROID SURGERY  early 2000s   goiter   TOTAL 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/2021   Procedure: TOTAL KNEE ARTHROPLASTY;  Surgeon: Sheral Apley, MD;  Location: [facility];  Service: Orthopedics;  Laterality: Right;   TUBAL LIGATION  1993   Family History  Problem Relation Age of Onset   Diabetes Mother    Hyperlipidemia Mother    Hypertension Mother    Thyroid disease Mother    Rheum arthritis Mother    COPD Mother    Cancer Paternal Grandfather        Prostate Cancer   Coronary artery disease Other    Colon cancer Neg Hx    Colon polyps Neg Hx    Esophageal cancer Neg Hx    Rectal cancer Neg Hx    Stomach cancer Neg Hx    Pancreatic cancer Neg Hx    Sleep apnea Neg Hx    Social History   Socioeconomic History   Marital status: Married    Spouse name: Not on file   Number of children: Not on file   Years of education: Not on file   Highest education level: Bachelor's degree (e.g., BA, AB, BS)  Occupational History   Occupation: unemployed  Tobacco Use   Smoking status: Former    Current packs/day: 0.00    Average packs/day: 1 pack/day for 10.0 years (10.0 ttl pk-yrs)    Types: Cigarettes    Start date: 07/29/1974    Quit date: 07/29/1984    Years since quitting: 38.6    Smokeless tobacco: Never  Vaping Use   Vaping status: Never Used  Substance and Sexual Activity   Alcohol use: Yes    Comment: occassionally   Drug use: No   Sexual activity: Yes  Other Topics Concern   Not on file  Social History Narrative   Married for 32 years    Six children- all live around here. Two daughters live with her   10 grand children    On Disability for mental issues   No pets   Likes to go to movies and out to dinner. She likes to do puzzle books. Likes to read      She eats a healthy diet   She does not exercise    Lives at home with husband and granddaughter   Caffeine: 4 cups/day   Social Determinants of Health   Financial Resource Strain: Low Risk  (03/14/2023)   Overall Financial Resource Strain (CARDIA)    Difficulty of Paying Living Expenses: Not hard at all  Food Insecurity: No Food Insecurity (03/14/2023)   Hunger Vital Sign    Worried About Running Out of Food in the Last Year: Never true    Ran Out of Food in the Last Year: Never true  Transportation Needs: No Transportation Needs (03/14/2023)   PRAPARE - Administrator, Civil Service (Medical): No    Lack of Transportation (Non-Medical): No  Physical Activity: Insufficiently Active (03/14/2023)   Exercise Vital Sign    Days of Exercise per Week: 3 days    Minutes of Exercise per Session: 30 min  Stress: No Stress Concern Present (03/14/2023)   [employer] of Occupational Health - Occupational Stress Questionnaire    Feeling of Stress : Not at all  Social Connections: Socially Integrated (03/14/2023)   Social Connection and Isolation Panel [NHANES]    Frequency of Communication  with Friends and Family: More than three times a week    Frequency of Social Gatherings with Friends and Family: More than three times a week    Attends Religious Services: More than 4 times per year    Active Member of [employer] or Organizations: Yes    Attends Engineer, structural: More than 4 times  per year    Marital Status: Married    Tobacco Counseling Counseling given: Not Answered   Clinical Intake:  Pre-visit preparation completed: Yes  Pain : No/denies pain     BMI - recorded: 32.16 Nutritional Status: BMI > 30  Obese Nutritional Risks: None Diabetes: Yes CBG done?: No (Audio Visit) Did pt. bring in CBG monitor from home?: No  How often do you need to have someone help you when you read instructions, pamphlets, or other written materials from your doctor or pharmacy?: 1 - Never  Interpreter Needed?: No  Information entered by :: Theresa Mulligan LPN   Activities of Daily Living    03/14/2023    9:31 AM 03/13/2023    9:34 PM  In your present state of health, do you have any difficulty performing the following activities:  Hearing? 0 0  Vision? 0 0  Difficulty concentrating or making decisions? 0 0  Walking or climbing stairs? 0 0  Dressing or bathing? 0 0  Doing errands, shopping? 0 0  Preparing Food and eating ? N N  Using the Toilet? N N  In the past six months, have you accidently leaked urine? Malvin Johns  Comment Wears breifs at night. Followed by PCP   Do you have problems with loss of bowel control? N N  Managing your Medications? N N  Managing your Finances? N N  Housekeeping or managing your Housekeeping? N N    Patient Care Team: Shirline Frees, NP as PCP - General (Family Medicine) Carman Ching, OD (Optometry)  Indicate any recent Medical Services you may have received from other than [facility] providers in the past year (date may be approximate).     Assessment:   This is a routine wellness examination for Alexandra [name].  Hearing/Vision screen Hearing Screening - Comments:: Denies hearing difficulties   Vision Screening - Comments:: Wears rx glasses - up to date with routine eye exams with  [facility]  Dietary issues and exercise activities discussed:     Goals Addressed               This Visit's Progress     Stay Healthy  (pt-stated)         Depression Screen    03/14/2023    9:30 AM 05/03/2022    8:14 AM 03/08/2022    3:40 PM 08/02/2021   10:52 AM 05/17/2021    8:56 AM 04/15/2019   11:11 AM 04/10/2018    8:48 AM  PHQ 2/9 Scores  PHQ - 2 Score 0 0 0 0 0 3 0  PHQ- 9 Score  0    9     Fall Risk    03/14/2023    9:32 AM 03/13/2023    9:34 PM 05/03/2022    8:15 AM 03/08/2022    3:43 PM 08/02/2021   11:29 AM  Fall Risk   Falls in the past year? 0 0 0 0 1  Number falls in past yr: 0  0 0 1  Injury with Fall? 0  0 0 0  Risk for fall due to : No Fall Risks  No Fall  Risks No Fall Risks   Follow up Falls prevention discussed  Falls evaluation completed      MEDICARE RISK AT HOME:  Medicare Risk at Home - 03/14/23 0939     Any stairs in or around the home? Yes    If so, are there any without handrails? No    Home free of loose throw rugs in walkways, pet beds, electrical cords, etc? Yes    Adequate lighting in your home to reduce risk of falls? Yes    Life alert? No    Use of a cane, walker or w/c? No    Grab bars in the bathroom? No    Shower chair or bench in shower? No    Elevated toilet seat or a handicapped toilet? No             TIMED UP AND GO:  Was the test performed?  No    Cognitive Function:        03/14/2023    9:33 AM 03/08/2022    3:44 PM  6CIT Screen  What Year? 0 points 0 points  What month? 0 points 0 points  What time? 0 points 0 points  Count back from 20 0 points 0 points  Months in reverse 0 points 0 points  Repeat phrase 0 points 0 points  Total Score 0 points 0 points    Immunizations Immunization History  Administered Date(s) Administered   Covid-19, Mrna,Vaccine(Spikevax)37yrs and older 05/06/2022   Influenza Split 04/24/2012   Influenza,inj,Quad PF,6+ Mos 03/30/2013, 06/20/2014, 04/17/2015, 04/08/2016, 03/25/2017, 04/10/2018, 10/06/2019, 05/04/2020, 05/17/2021, 05/03/2022   PFIZER(Purple Top)SARS-COV-2 Vaccination 10/24/2019, 11/14/2019, 05/28/2020,  06/09/2021   Pneumococcal Conjugate-13 03/16/2015, 03/25/2017   Pneumococcal Polysaccharide-23 04/10/2018   Td 08/29/1998, 03/29/2005   Tdap 03/16/2015   Zoster Recombinant(Shingrix) 02/15/2021, 05/17/2021    TDAP status: Up to date  Flu Vaccine status: Due, Education has been provided regarding the importance of this vaccine. Advised may receive this vaccine at local pharmacy or Health Dept. Aware to provide a copy of the vaccination record if obtained from local pharmacy or Health Dept. Verbalized acceptance and understanding.    Covid-19 vaccine status: Declined, Education has been provided regarding the importance of this vaccine but patient still declined. Advised may receive this vaccine at local pharmacy or Health Dept.or vaccine clinic. Aware to provide a copy of the vaccination record if obtained from local pharmacy or Health Dept. Verbalized acceptance and understanding.  Qualifies for Shingles Vaccine? Yes   Zostavax completed Yes   Shingrix Completed?: Yes  Screening Tests Health Maintenance  Topic Date Due   COVID-19 Vaccine (6 - 2023-24 season) 07/01/2022   MAMMOGRAM  02/18/2023   INFLUENZA VACCINE  02/27/2023   HEMOGLOBIN A1C  08/22/2023   Diabetic kidney evaluation - Urine ACR  11/05/2023   FOOT EXAM  11/05/2023   OPHTHALMOLOGY EXAM  01/14/2024   Diabetic kidney evaluation - eGFR measurement  02/19/2024   Medicare Annual Wellness (AWV)  03/13/2024   DTaP/Tdap/Td (4 - Td or Tdap) 03/15/2025   Colonoscopy  03/16/2028   Hepatitis C Screening  Completed   HIV Screening  Completed   Zoster Vaccines- Shingrix  Completed   HPV VACCINES  Aged Out    Health Maintenance  Health Maintenance Due  Topic Date Due   COVID-19 Vaccine (6 - 2023-24 season) 07/01/2022   MAMMOGRAM  02/18/2023   INFLUENZA VACCINE  02/27/2023    Colorectal cancer screening: Type of screening: Colonoscopy. Completed 03/16/21. Repeat every 7 years  Mammogram status: Ordered 03/14/23. Pt  provided with contact info and advised to call to schedule appt.     Lung Cancer Screening: (Low Dose CT Chest recommended if Age 71-80 years, 20 pack-year currently smoking OR have quit w/in 15years.) does not qualify.     Additional Screening:  Hepatitis C Screening: does qualify; Completed 03/19/16  Vision Screening: Recommended annual ophthalmology exams for early detection of glaucoma and other disorders of the eye. Is the patient up to date with their annual eye exam?  Yes  Who is the provider or what is the name of the office in which the patient attends annual eye exams? [facility] If pt is not established with a provider, would they like to be referred to a provider to establish care? No .   Dental Screening: Recommended annual dental exams for proper oral hygiene  Diabetic Foot Exam: Diabetic Foot Exam: Completed 11/05/22  Community Resource Referral / Chronic Care Management:  CRR required this visit?  No   CCM required this visit?  No     Plan:     I have personally reviewed and noted the following in the patient's chart:   Medical and social history Use of alcohol, tobacco or illicit drugs  Current medications and supplements including opioid prescriptions. Patient is not currently taking opioid prescriptions. Functional ability and status Nutritional status Physical activity Advanced directives List of other physicians Hospitalizations, surgeries, and ER visits in previous 12 months Vitals Screenings to include cognitive, depression, and falls Referrals and appointments  In addition, I have reviewed and discussed with patient certain preventive protocols, quality metrics, and best practice recommendations. A written personalized care plan for preventive services as well as general preventive health recommendations were provided to patient.     Tillie Rung, LPN   3/76/2831   After Visit Summary: (MyChart) Due to this being a telephonic visit,  the after visit summary with patients personalized plan was offered to patient via MyChart   Nurse Notes: None

## 2023-03-16 ENCOUNTER — Other Ambulatory Visit: Payer: Self-pay | Admitting: Family Medicine

## 2023-03-19 ENCOUNTER — Encounter: Payer: Self-pay | Admitting: Adult Health

## 2023-03-19 DIAGNOSIS — G8929 Other chronic pain: Secondary | ICD-10-CM | POA: Diagnosis not present

## 2023-03-19 DIAGNOSIS — M25562 Pain in left knee: Secondary | ICD-10-CM | POA: Diagnosis not present

## 2023-03-19 DIAGNOSIS — E1140 Type 2 diabetes mellitus with diabetic neuropathy, unspecified: Secondary | ICD-10-CM

## 2023-03-19 DIAGNOSIS — Z96659 Presence of unspecified artificial knee joint: Secondary | ICD-10-CM | POA: Diagnosis not present

## 2023-03-19 DIAGNOSIS — M25569 Pain in unspecified knee: Secondary | ICD-10-CM | POA: Diagnosis not present

## 2023-03-21 DIAGNOSIS — E119 Type 2 diabetes mellitus without complications: Secondary | ICD-10-CM | POA: Diagnosis not present

## 2023-03-21 MED ORDER — LANTUS SOLOSTAR 100 UNIT/ML ~~LOC~~ SOPN: 30.0000 [IU] | PEN_INJECTOR | Freq: Every day | SUBCUTANEOUS | 0 refills | Status: DC

## 2023-03-21 NOTE — Telephone Encounter (Signed)
This has been taking care of. Pt notified of udpate.

## 2023-03-24 ENCOUNTER — Other Ambulatory Visit: Payer: Self-pay | Admitting: Adult Health

## 2023-03-24 DIAGNOSIS — E039 Hypothyroidism, unspecified: Secondary | ICD-10-CM

## 2023-03-24 DIAGNOSIS — I10 Essential (primary) hypertension: Secondary | ICD-10-CM

## 2023-03-25 DIAGNOSIS — G8929 Other chronic pain: Secondary | ICD-10-CM | POA: Diagnosis not present

## 2023-03-25 DIAGNOSIS — M1712 Unilateral primary osteoarthritis, left knee: Secondary | ICD-10-CM | POA: Diagnosis not present

## 2023-03-25 DIAGNOSIS — G894 Chronic pain syndrome: Secondary | ICD-10-CM | POA: Diagnosis not present

## 2023-03-25 DIAGNOSIS — M7652 Patellar tendinitis, left knee: Secondary | ICD-10-CM | POA: Diagnosis not present

## 2023-03-25 DIAGNOSIS — M25561 Pain in right knee: Secondary | ICD-10-CM | POA: Diagnosis not present

## 2023-03-25 DIAGNOSIS — M25562 Pain in left knee: Secondary | ICD-10-CM | POA: Diagnosis not present

## 2023-04-11 ENCOUNTER — Ambulatory Visit
Admission: RE | Admit: 2023-04-11 | Discharge: 2023-04-11 | Disposition: A | Discharge status: Home / Self Care | Payer: Medicare Other | Source: Ambulatory Visit | Attending: Adult Health | Admitting: Adult Health

## 2023-04-11 DIAGNOSIS — Z1231 Encounter for screening mammogram for malignant neoplasm of breast: Secondary | ICD-10-CM | POA: Diagnosis not present

## 2023-04-15 ENCOUNTER — Telehealth: Payer: Self-pay | Admitting: Adult Health

## 2023-04-15 NOTE — Telephone Encounter (Signed)
Alexandra [name] pharm tech with [facility] is calling and the need ok to change manufacture to lupin from amneal reference number is [phone] levothyroxine (SYNTHROID) 112 MCG tablet

## 2023-04-17 DIAGNOSIS — Z96659 Presence of unspecified artificial knee joint: Secondary | ICD-10-CM | POA: Diagnosis not present

## 2023-04-17 DIAGNOSIS — E1140 Type 2 diabetes mellitus with diabetic neuropathy, unspecified: Secondary | ICD-10-CM | POA: Diagnosis not present

## 2023-04-17 DIAGNOSIS — M25562 Pain in left knee: Secondary | ICD-10-CM | POA: Diagnosis not present

## 2023-04-17 DIAGNOSIS — G8929 Other chronic pain: Secondary | ICD-10-CM | POA: Diagnosis not present

## 2023-04-17 DIAGNOSIS — M25569 Pain in unspecified knee: Secondary | ICD-10-CM | POA: Diagnosis not present

## 2023-04-17 NOTE — Telephone Encounter (Signed)
Pharmacist notified update

## 2023-04-17 NOTE — Telephone Encounter (Signed)
Ok to switch

## 2023-04-21 ENCOUNTER — Other Ambulatory Visit: Payer: Self-pay | Admitting: Adult Health

## 2023-04-25 DIAGNOSIS — G8929 Other chronic pain: Secondary | ICD-10-CM | POA: Diagnosis not present

## 2023-04-25 DIAGNOSIS — M25561 Pain in right knee: Secondary | ICD-10-CM | POA: Diagnosis not present

## 2023-04-27 ENCOUNTER — Other Ambulatory Visit: Payer: Self-pay | Admitting: Adult Health

## 2023-04-27 DIAGNOSIS — E1140 Type 2 diabetes mellitus with diabetic neuropathy, unspecified: Secondary | ICD-10-CM

## 2023-04-28 ENCOUNTER — Other Ambulatory Visit (HOSPITAL_COMMUNITY): Payer: Self-pay

## 2023-04-28 ENCOUNTER — Other Ambulatory Visit: Payer: Self-pay | Admitting: Adult Health

## 2023-04-29 ENCOUNTER — Other Ambulatory Visit (HOSPITAL_COMMUNITY): Payer: Self-pay

## 2023-04-29 MED ORDER — MOUNJARO 10 MG/0.5ML ~~LOC~~ SOAJ
10.0000 mg | SUBCUTANEOUS | 0 refills | Status: DC
  Filled 2023-04-29: qty 6, 84d supply, fill #0

## 2023-04-30 ENCOUNTER — Other Ambulatory Visit: Payer: Self-pay

## 2023-04-30 ENCOUNTER — Other Ambulatory Visit (HOSPITAL_COMMUNITY): Payer: Self-pay

## 2023-05-17 DIAGNOSIS — E119 Type 2 diabetes mellitus without complications: Secondary | ICD-10-CM | POA: Diagnosis not present

## 2023-05-20 DIAGNOSIS — M25569 Pain in unspecified knee: Secondary | ICD-10-CM | POA: Diagnosis not present

## 2023-05-20 DIAGNOSIS — Z96659 Presence of unspecified artificial knee joint: Secondary | ICD-10-CM | POA: Diagnosis not present

## 2023-05-20 DIAGNOSIS — G894 Chronic pain syndrome: Secondary | ICD-10-CM | POA: Diagnosis not present

## 2023-05-20 DIAGNOSIS — E1140 Type 2 diabetes mellitus with diabetic neuropathy, unspecified: Secondary | ICD-10-CM | POA: Diagnosis not present

## 2023-05-20 DIAGNOSIS — M25562 Pain in left knee: Secondary | ICD-10-CM | POA: Diagnosis not present

## 2023-05-20 DIAGNOSIS — G8929 Other chronic pain: Secondary | ICD-10-CM | POA: Diagnosis not present

## 2023-05-22 ENCOUNTER — Other Ambulatory Visit: Payer: Self-pay | Admitting: Adult Health

## 2023-05-22 ENCOUNTER — Ambulatory Visit: Payer: Medicare Other | Admitting: Adult Health

## 2023-05-22 ENCOUNTER — Telehealth: Payer: Self-pay

## 2023-05-22 ENCOUNTER — Encounter: Payer: Self-pay | Admitting: Adult Health

## 2023-05-22 VITALS — BP 120/70 | HR 88 | Temp 97.7°F | Ht 61.5 in | Wt 168.0 lb

## 2023-05-22 DIAGNOSIS — E876 Hypokalemia: Secondary | ICD-10-CM

## 2023-05-22 DIAGNOSIS — Z7984 Long term (current) use of oral hypoglycemic drugs: Secondary | ICD-10-CM

## 2023-05-22 DIAGNOSIS — R197 Diarrhea, unspecified: Secondary | ICD-10-CM

## 2023-05-22 DIAGNOSIS — Z7985 Long-term (current) use of injectable non-insulin antidiabetic drugs: Secondary | ICD-10-CM | POA: Diagnosis not present

## 2023-05-22 DIAGNOSIS — I10 Essential (primary) hypertension: Secondary | ICD-10-CM | POA: Diagnosis not present

## 2023-05-22 DIAGNOSIS — E1140 Type 2 diabetes mellitus with diabetic neuropathy, unspecified: Secondary | ICD-10-CM

## 2023-05-22 DIAGNOSIS — E119 Type 2 diabetes mellitus without complications: Secondary | ICD-10-CM

## 2023-05-22 LAB — CBC
HCT: 41.2 % (ref 36.0–46.0)
Hemoglobin: 13.9 g/dL (ref 12.0–15.0)
MCHC: 33.7 g/dL (ref 30.0–36.0)
MCV: 94 fL (ref 78.0–100.0)
Platelets: 377 10*3/uL (ref 150.0–400.0)
RBC: 4.39 Mil/uL (ref 3.87–5.11)
RDW: 13.1 % (ref 11.5–15.5)
WBC: 10.7 10*3/uL — ABNORMAL HIGH (ref 4.0–10.5)

## 2023-05-22 LAB — BASIC METABOLIC PANEL
BUN: 4 mg/dL — ABNORMAL LOW (ref 6–23)
CO2: 32 meq/L (ref 19–32)
Calcium: 8.7 mg/dL (ref 8.4–10.5)
Chloride: 93 meq/L — ABNORMAL LOW (ref 96–112)
Creatinine, Ser: 0.77 mg/dL (ref 0.40–1.20)
GFR: 82.24 mL/min (ref >60.00)
Glucose, Bld: 371 mg/dL — ABNORMAL HIGH (ref 70–99)
Potassium: 2.7 meq/L — CL (ref 3.5–5.1)
Sodium: 138 meq/L (ref 135–145)

## 2023-05-22 LAB — POCT GLYCOSYLATED HEMOGLOBIN (HGB A1C): Hemoglobin A1C: 7.6 % — AB (ref 4.0–5.6)

## 2023-05-22 MED ORDER — CIPROFLOXACIN HCL 500 MG PO TABS: 500.0000 mg | ORAL_TABLET | Freq: Two times a day (BID) | ORAL | 0 refills | Status: AC

## 2023-05-22 MED ORDER — POTASSIUM CHLORIDE CRYS ER 20 MEQ PO TBCR: 20.0000 meq | EXTENDED_RELEASE_TABLET | Freq: Every day | ORAL | 0 refills | Status: DC

## 2023-05-22 NOTE — Progress Notes (Signed)
Subjective:    Patient ID: Alexandra [name], female    DOB: September 06, 1960, 62 y.o.   MRN: [id number]  HPI  62 year old female who  has a past medical history of Allergy, Anemia, Blood transfusion without reported diagnosis, Diabetes mellitus (HCC), Fatty liver, GERD (gastroesophageal reflux disease), Hiatal hernia (07/29/1996), Hyperlipidemia, Hyperplastic colon polyp, Hypertension, Hypothyroidism, Neuromuscular disorder (HCC), Rotator cuff tear, right, and Stroke (HCC).  he presents to the office today for three month follow up regarding DM and HTN   Dabetes mellitus type II-managed with Mounjaro 10 mg weekly, metformin 1000 mg daily, and Lantus 30 units daily.  She uses the Saline sensor to manage her blood sugars. She continues to drink multiple soft drinks a day.  Lab Results  Component Value Date   HGBA1C 9.2 (A) 02/19/2023   HGBA1C 8.0 (H) 11/05/2022   HGBA1C 6.9 (A) 08/02/2022   Wt Readings from Last 3 Encounters:  05/22/23 168 lb (76.2 kg)  03/14/23 173 lb (78.5 kg)  02/19/23 173 lb (78.5 kg)    Hypertension - managed with Ziac 10-6.25  mg daily.  BP Readings from Last 3 Encounters:  05/22/23 120/70  02/19/23 120/80  11/05/22 130/80   Diarrhea - this has been an ongoing issue since before July 2024. Her symptoms started before she was placed on Mounjaro. She will experience generalized abdominal cramping. Her stool culture was negative and CT abdomen and pelvis was negative. She denies nausea, vomiting, fevers or chills. She continues to have " at least two bowel movements a day that are all water". We did try her on a 10 day course of Bentyl which she reports did not work.   Review of Systems See HPI   Past Medical History:  Diagnosis Date   Allergy    Anemia    past hx   Blood transfusion without reported diagnosis    Diabetes mellitus (HCC)    Fatty liver    GERD (gastroesophageal reflux disease)    prn med.   Hiatal hernia 07/29/1996   Hyperlipidemia     Hyperplastic colon polyp    Hypertension    new dx. - will start med. 06/28/2011   Hypothyroidism    Neuromuscular disorder (HCC)    hemiplegia , neuropathy   Rotator cuff tear, right    Stroke [facility])    TIA 2016    Social History   Socioeconomic History   Marital status: Married    Spouse name: Not on file   Number of children: Not on file   Years of education: Not on file   Highest education level: Bachelor's degree (e.g., BA, AB, BS)  Occupational History   Occupation: unemployed  Tobacco Use   Smoking status: Former    Current packs/day: 0.00    Average packs/day: 1 pack/day for 10.0 years (10.0 ttl pk-yrs)    Types: Cigarettes    Start date: 07/29/1974    Quit date: 07/29/1984    Years since quitting: 38.8   Smokeless tobacco: Never  Vaping Use   Vaping status: Never Used  Substance and Sexual Activity   Alcohol use: Yes    Comment: occassionally   Drug use: No   Sexual activity: Yes  Other Topics Concern   Not on file  Social History Narrative   Married for 32 years    Six children- all live around here. Two daughters live with her   10 grand children    On Disability for mental issues   No  pets   Likes to go to movies and out to dinner. She likes to do puzzle books. Likes to read      She eats a healthy diet   She does not exercise    Lives at home with husband and granddaughter   Caffeine: 4 cups/day   Social Determinants of Health   Financial Resource Strain: Low Risk  (05/21/2023)   Overall Financial Resource Strain (CARDIA)    Difficulty of Paying Living Expenses: Not hard at all  Food Insecurity: No Food Insecurity (05/21/2023)   Hunger Vital Sign    Worried About Running Out of Food in the Last Year: Never true    Ran Out of Food in the Last Year: Never true  Transportation Needs: No Transportation Needs (05/21/2023)   PRAPARE - Administrator, Civil Service (Medical): No    Lack of Transportation (Non-Medical): No  Physical  Activity: Unknown (05/21/2023)   Exercise Vital Sign    Days of Exercise per Week: Patient declined    Minutes of Exercise per Session: 30 min  Recent Concern: Physical Activity - Insufficiently Active (03/14/2023)   Exercise Vital Sign    Days of Exercise per Week: 3 days    Minutes of Exercise per Session: 30 min  Stress: No Stress Concern Present (05/21/2023)   [employer] of Occupational Health - Occupational Stress Questionnaire    Feeling of Stress : Not at all  Social Connections: Socially Integrated (05/21/2023)   Social Connection and Isolation Panel [NHANES]    Frequency of Communication with Friends and Family: More than three times a week    Frequency of Social Gatherings with Friends and Family: More than three times a week    Attends Religious Services: More than 4 times per year    Active Member of Clubs or Organizations: Yes    Attends Banker Meetings: More than 4 times per year    Marital Status: Married  Catering manager Violence: Not At Risk (03/14/2023)   Humiliation, Afraid, Rape, and Kick questionnaire    Fear of Current or Ex-Partner: No    Emotionally Abused: No    Physically Abused: No    Sexually Abused: No    Past Surgical History:  Procedure Laterality Date   ABDOMINAL HYSTERECTOMY  06/25/2000   left salpingo-oophorectomy   BACK SURGERY  07/30/1995   discetomy, lumbar spinal fusion   CARDIAC CATHETERIZATION  04/01/2006   CESAREAN SECTION  07/29/1989   CHOLECYSTECTOMY  07/30/1987   COLONOSCOPY     EXPLORATORY LAPAROTOMY  05/08/2000   right salpingo-oophorectomy   LAMINOTOMY / EXCISION DISK POSTERIOR CERVICAL SPINE  08/10/2009   C7-T1   MENISCUS REPAIR Right 04/2021   Knee   POLYPECTOMY     [facility] 2012   ROUX-EN-Y GASTRIC BYPASS  05/19/2007   SHOULDER ARTHROSCOPY  01/02/2006; 05/31/2004   left 2007; right 2005   SHOULDER ARTHROSCOPY  07/02/2011   Procedure: ARTHROSCOPY SHOULDER;  Surgeon: Wyn Forster., MD;  Location: [facility]  [place];  Service: Orthopedics;  Laterality: Right;  repair supraspinatus, Debride glenohumeral joint, labrum   SHOULDER ARTHROSCOPY DISTAL CLAVICLE EXCISION AND OPEN ROTATOR CUFF REPAIR  07/26/2010   right   THYROID SURGERY  early 2000s   goiter   TOTAL 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/2021   Procedure: TOTAL KNEE ARTHROPLASTY;  Surgeon: Sheral Apley, MD;  Location: [facility];  Service: Orthopedics;  Laterality: Right;   TUBAL LIGATION  1993    Family History  Problem Relation Age of Onset   Diabetes Mother    Hyperlipidemia Mother    Hypertension Mother    Thyroid disease Mother    Rheum arthritis Mother    COPD Mother    Cancer Paternal Grandfather        Prostate Cancer   Coronary artery disease Other    Colon cancer Neg Hx    Colon polyps Neg Hx    Esophageal cancer Neg Hx    Rectal cancer Neg Hx    Stomach cancer Neg Hx    Pancreatic cancer Neg Hx    Sleep apnea Neg Hx     Allergies  Allergen Reactions   Shrimp (Diagnostic) Anaphylaxis    Eating shrimp causes swelling of tongue and rash   Ace Inhibitors Cough    Current Outpatient Medications on File Prior to Visit  Medication Sig Dispense Refill   acetaminophen (TYLENOL) 500 MG tablet Take 2 tablets (1,000 mg total) by mouth every 6 (six) hours as needed for mild pain or moderate pain. 60 tablet 0   albuterol (VENTOLIN HFA) 108 (90 Base) MCG/ACT inhaler TAKE 2 PUFFS BY MOUTH EVERY 6 HOURS AS NEEDED FOR WHEEZE OR SHORTNESS OF BREATH 18 each 1   B Complex Vitamins (B COMPLEX PO) Take 1 tablet by mouth daily.     B-D UF III MINI PEN NEEDLES 31G X 5 MM MISC USE WITH INSULIN PEN AS DIRECTED 100 each 3   BIOTIN PO Take 1 tablet by mouth daily.     bisoprolol-hydrochlorothiazide (ZIAC) 10-6.25 MG tablet TAKE 1 TABLET BY MOUTH DAILY 100 tablet 2   Blood Glucose Monitoring Suppl (ONETOUCH VERIO) w/Device KIT 1 Device by Does not apply route daily. Use to check blood glucose 4 times day or prn 1 kit 0    Cholecalciferol (VITAMIN D3 PO) Take 1 tablet by mouth daily.     Continuous Blood Gluc Sensor (FREESTYLE LIBRE 3 SENSOR) MISC 1 Device by Does not apply route every 14 (fourteen) days. Place 1 sensor on the skin every 14 days. Use to check glucose continuously 6 each 3   diclofenac Sodium (VOLTAREN) 1 % GEL 2 g 3 (three) times daily as needed (pain).     dicyclomine (BENTYL) 10 MG capsule Take 1 capsule (10 mg total) by mouth 4 (four) times daily -  before meals and at bedtime for 10 days. 40 capsule 0   DULoxetine (CYMBALTA) 30 MG capsule Take 30 mg by mouth every morning.     ferrous sulfate 325 (65 FE) MG EC tablet Take 325 mg by mouth daily with breakfast.     fluticasone (FLONASE) 50 MCG/ACT nasal spray USE 2 SPRAYS IN EACH NOSTRIL DAILY (Patient taking differently: 2 sprays daily as needed for allergies or rhinitis.) 48 g 3   gabapentin (NEURONTIN) 400 MG capsule TAKE 2 CAPSULES BY MOUTH 3 TIMES DAILY 600 capsule 2   glucose blood (ONETOUCH VERIO) test strip USE TO CHECK BLOOD SUGAR 4 TIMES DAILY OR AS NEEDED 400 strip 2   HYDROcodone-acetaminophen (NORCO/VICODIN) 5-325 MG tablet Take 1 tablet by mouth every 8 (eight) hours as needed for moderate pain.     Lancet Devices MISC Use to check blood sugar TID 100 each 2   LANTUS SOLOSTAR 100 UNIT/ML Solostar Pen INJECT SUBCUTANEOUSLY 30 UNITS  DAILY 15 mL 6   levothyroxine (SYNTHROID) 112 MCG tablet TAKE 1 TABLET BY MOUTH DAILY 100 tablet 2   meloxicam (MOBIC) 7.5 MG tablet Take 7.5 mg by mouth  daily.     metFORMIN (GLUCOPHAGE) 1000 MG tablet TAKE 1 TABLET BY MOUTH DAILY  WITH BREAKFAST 100 tablet 2   ondansetron (ZOFRAN) 4 MG tablet Take 1 tablet (4 mg total) by mouth every 8 (eight) hours as needed for nausea or vomiting. 20 tablet 2   ondansetron (ZOFRAN-ODT) 4 MG disintegrating tablet Take 1 tablet (4 mg total) by mouth 2 (two) times daily as needed for nausea or vomiting. 10 tablet 0   ONE TOUCH LANCETS MISC Test blood sugars once daily Dx:  E11.9 100 each 3   oxybutynin (DITROPAN-XL) 5 MG 24 hr tablet TAKE 1 TABLET BY MOUTH AT  BEDTIME 100 tablet 2   pantoprazole (PROTONIX) 40 MG tablet TAKE 1 TABLET BY MOUTH DAILY 100 tablet 2   potassium chloride (KLOR-CON 10) 10 MEQ tablet Take 1 tablet (10 mEq total) by mouth 2 (two) times daily. 60 tablet 0   risperiDONE (RISPERDAL) 1 MG tablet Take 1 mg by mouth 2 (two) times daily.     simvastatin (ZOCOR) 20 MG tablet TAKE 1 TABLET BY MOUTH AT  BEDTIME 100 tablet 2   tirzepatide (MOUNJARO) 10 MG/0.5ML Pen Inject 10 mg into the skin once a week. 6 mL 0   traZODone (DESYREL) 100 MG tablet Take 200 mg by mouth at bedtime.     VITAMIN E PO Take 1 capsule by mouth daily.     No current facility-administered medications on file prior to visit.    BP 120/70   Pulse 88   Temp 97.7 F (36.5 C) (Oral)   Ht 5' 1.5" (1.562 m)   Wt 168 lb (76.2 kg)   SpO2 97%   BMI 31.23 kg/m       Objective:   Physical Exam Vitals and nursing note reviewed.  Constitutional:      Appearance: Normal appearance. She is obese.  Cardiovascular:     Rate and Rhythm: Normal rate and regular rhythm.     Pulses: Normal pulses.     Heart sounds: Normal heart sounds.  Pulmonary:     Effort: Pulmonary effort is normal.     Breath sounds: Normal breath sounds.  Skin:    General: Skin is warm and dry.  Neurological:     General: No focal deficit present.     Mental Status: She is alert and oriented to person, place, and time.  Psychiatric:        Mood and Affect: Mood normal.        Behavior: Behavior normal.        Thought Content: Thought content normal.        Judgment: Judgment normal.       Assessment & Plan:   1. Type 2 diabetes mellitus with diabetic neuropathy, without long-term current use of insulin (HCC)  - POC HgB A1c- 7.6. This has improved.  - Continue with Mounjaro 10 mg weekly, lantus 30 units daily and Metformin  - Follow up in 3 months   2. Diabetes mellitus treated with oral  medication (HCC)  - POC HgB A1c  3. Long-term current use of injectable noninsulin antidiabetic medication  - POC HgB A1c  4. Essential hypertension - Well controlled. No change in medication   5. Diarrhea, unspecified type -  - CBC; Future - Ambulatory referral to Gastroenterology

## 2023-05-22 NOTE — Patient Instructions (Signed)
Health Maintenance Due  Topic Date Due   INFLUENZA VACCINE  02/27/2023   COVID-19 Vaccine (6 - 2023-24 season) 03/30/2023       03/14/2023    9:30 AM 05/03/2022    8:14 AM 03/08/2022    3:40 PM  Depression screen PHQ 2/9  Decreased Interest 0 0 0  Down, Depressed, Hopeless 0 0 0  PHQ - 2 Score 0 0 0  Altered sleeping  0   Tired, decreased energy  0   Change in appetite  0   Feeling bad or failure about yourself   0   Trouble concentrating  0   Moving slowly or fidgety/restless  0   Suicidal thoughts  0   PHQ-9 Score  0   Difficult doing work/chores  Not difficult at all

## 2023-05-22 NOTE — Telephone Encounter (Signed)
Spoke to pt and she stated that she is still having diarrhea. Pt does not have anymore potassium supplements. Advised that we will wait to see what [facility] advise.

## 2023-05-22 NOTE — Telephone Encounter (Signed)
CRITICAL VALUE STICKER  CRITICAL VALUE:  Potassium-  2.7  RECEIVER (on-site recipient of call):  DATE & TIME NOTIFIED:   MESSENGER (representative from lab): Saa from Lab  MD NOTIFIED: Shirline Frees, NP  TIME OF NOTIFICATION:  RESPONSE:

## 2023-05-27 DIAGNOSIS — M25562 Pain in left knee: Secondary | ICD-10-CM | POA: Diagnosis not present

## 2023-05-27 DIAGNOSIS — G894 Chronic pain syndrome: Secondary | ICD-10-CM | POA: Diagnosis not present

## 2023-05-27 DIAGNOSIS — M25561 Pain in right knee: Secondary | ICD-10-CM | POA: Diagnosis not present

## 2023-05-29 ENCOUNTER — Other Ambulatory Visit: Payer: Self-pay | Admitting: Adult Health

## 2023-05-29 DIAGNOSIS — E7800 Pure hypercholesterolemia, unspecified: Secondary | ICD-10-CM

## 2023-06-03 ENCOUNTER — Other Ambulatory Visit: Payer: Self-pay | Admitting: Adult Health

## 2023-06-03 DIAGNOSIS — E1140 Type 2 diabetes mellitus with diabetic neuropathy, unspecified: Secondary | ICD-10-CM

## 2023-06-18 DIAGNOSIS — Z96659 Presence of unspecified artificial knee joint: Secondary | ICD-10-CM | POA: Diagnosis not present

## 2023-06-18 DIAGNOSIS — M25562 Pain in left knee: Secondary | ICD-10-CM | POA: Diagnosis not present

## 2023-06-18 DIAGNOSIS — E1140 Type 2 diabetes mellitus with diabetic neuropathy, unspecified: Secondary | ICD-10-CM | POA: Diagnosis not present

## 2023-06-18 DIAGNOSIS — G8929 Other chronic pain: Secondary | ICD-10-CM | POA: Diagnosis not present

## 2023-06-18 DIAGNOSIS — M25569 Pain in unspecified knee: Secondary | ICD-10-CM | POA: Diagnosis not present

## 2023-07-02 ENCOUNTER — Other Ambulatory Visit: Payer: Self-pay | Admitting: Adult Health

## 2023-07-02 DIAGNOSIS — E1140 Type 2 diabetes mellitus with diabetic neuropathy, unspecified: Secondary | ICD-10-CM

## 2023-07-04 ENCOUNTER — Other Ambulatory Visit (INDEPENDENT_AMBULATORY_CARE_PROVIDER_SITE_OTHER): Payer: Medicare Other

## 2023-07-04 ENCOUNTER — Ambulatory Visit (INDEPENDENT_AMBULATORY_CARE_PROVIDER_SITE_OTHER): Payer: Medicare Other | Admitting: Gastroenterology

## 2023-07-04 ENCOUNTER — Encounter: Payer: Self-pay | Admitting: Gastroenterology

## 2023-07-04 ENCOUNTER — Other Ambulatory Visit: Payer: Self-pay

## 2023-07-04 ENCOUNTER — Ambulatory Visit
Admission: RE | Admit: 2023-07-04 | Discharge: 2023-07-04 | Disposition: A | Discharge status: Home / Self Care | Payer: Medicare Other | Source: Ambulatory Visit | Attending: Gastroenterology

## 2023-07-04 VITALS — BP 138/78 | HR 73 | Ht 61.5 in | Wt 167.0 lb

## 2023-07-04 DIAGNOSIS — E876 Hypokalemia: Secondary | ICD-10-CM

## 2023-07-04 DIAGNOSIS — K529 Noninfective gastroenteritis and colitis, unspecified: Secondary | ICD-10-CM | POA: Diagnosis not present

## 2023-07-04 DIAGNOSIS — R6881 Early satiety: Secondary | ICD-10-CM | POA: Diagnosis not present

## 2023-07-04 DIAGNOSIS — R197 Diarrhea, unspecified: Secondary | ICD-10-CM

## 2023-07-04 DIAGNOSIS — R160 Hepatomegaly, not elsewhere classified: Secondary | ICD-10-CM | POA: Diagnosis not present

## 2023-07-04 DIAGNOSIS — R109 Unspecified abdominal pain: Secondary | ICD-10-CM | POA: Diagnosis not present

## 2023-07-04 DIAGNOSIS — R1084 Generalized abdominal pain: Secondary | ICD-10-CM

## 2023-07-04 DIAGNOSIS — R110 Nausea: Secondary | ICD-10-CM

## 2023-07-04 LAB — COMPREHENSIVE METABOLIC PANEL
ALT: 21 U/L (ref 0–35)
AST: 30 U/L (ref 0–37)
Albumin: 3.8 g/dL (ref 3.5–5.2)
Alkaline Phosphatase: 67 U/L (ref 39–117)
BUN: 3 mg/dL — ABNORMAL LOW (ref 6–23)
CO2: 38 meq/L — ABNORMAL HIGH (ref 19–32)
Calcium: 8.9 mg/dL (ref 8.4–10.5)
Chloride: 96 meq/L (ref 96–112)
Creatinine, Ser: 0.61 mg/dL (ref 0.40–1.20)
GFR: 95.23 mL/min (ref >60.00)
Glucose, Bld: 117 mg/dL — ABNORMAL HIGH (ref 70–99)
Potassium: 3.3 meq/L — ABNORMAL LOW (ref 3.5–5.1)
Sodium: 141 meq/L (ref 135–145)
Total Bilirubin: 0.6 mg/dL (ref 0.2–1.2)
Total Protein: 7.1 g/dL (ref 6.0–8.3)

## 2023-07-04 NOTE — Progress Notes (Signed)
Chief Complaint: Diarrhea and abdominal pain Primary GI MD: Dr. Lavon Paganini  HPI: 62 year old female history of diabetes, fatty liver, GERD, hiatal hernia, neuromuscular disorder, gastric sleeve, stroke, presents for evaluation of abdominal pain and diarrhea.  She had a stool culture was negative CT abdomen pelvis with contrast which was negative.    H. pylori antibody IgG was negative. CBC 05/22/2023 with WBC 10.7 otherwise normal. Potassium 2.7, glucose 371, otherwise normal BMP. Potassium was also 2.02 February 2023 She stated a potassium pill  Discussed the use of AI scribe software for clinical note transcription with the patient, who gave verbal consent to proceed.  The patient, with a history of gastric bypass surgery and currently on Mounjaro for the past seven months, presents with chronic diarrhea since July. She reports having one to two watery bowel movements daily, with occasional periods of up to eight days without a bowel movement. The patient describes severe abdominal pain during these episodes, likening it to tracing a path around her stomach.  In addition to the diarrhea, the patient has been experiencing persistent nausea and stomach pain after eating since October. She reports that these symptoms have worsened over time. The patient describes feeling full quickly and being unable to eat much at a time. The discomfort is not specific to any particular food and occurs with anything she consumes.  The patient also has a history of low potassium levels and is currently on potassium supplements. She admits to a poor water intake, primarily consuming Pepsi, and only drinking water when taking her medications.      PREVIOUS GI WORKUP   CT abdomen pelvis with contrast 03/20/2023 showed s/p cholecystectomy.  Stomach with postop changes.  No obstruction.  No bowel wall thickening.  No acute abnormality.  Colonoscopy 03/16/2021 - two to 3-4 polyps in descending colon.  Removed with a  cold snare.  Resected and retrieved. - Nonbleeding external/internal hemorrhoids - Otherwise normal - Repeat 7 years (due 02/2028)  Surgical [P], colon, descending, polyp (2) - TUBULAR ADENOMA (2 OF 2 FRAGMENTS) - NO HIGH-GRADE DYSPLASIA OR MALIGNANCY IDENTIFIED  Past Medical History:  Diagnosis Date   Allergy    Anemia    past hx   Blood transfusion without reported diagnosis    Diabetes mellitus (HCC)    Fatty liver    GERD (gastroesophageal reflux disease)    prn med.   Hiatal hernia 07/29/1996   Hyperlipidemia    Hyperplastic colon polyp    Hypertension    new dx. - will start med. 06/28/2011   Hypothyroidism    Neuromuscular disorder (HCC)    hemiplegia , neuropathy   Rotator cuff tear, right    Stroke [facility])    TIA 2016    Past Surgical History:  Procedure Laterality Date   ABDOMINAL HYSTERECTOMY  06/25/2000   left salpingo-oophorectomy   BACK SURGERY  07/30/1995   discetomy, lumbar spinal fusion   CARDIAC CATHETERIZATION  04/01/2006   CESAREAN SECTION  07/29/1989   CHOLECYSTECTOMY  07/30/1987   COLONOSCOPY     EXPLORATORY LAPAROTOMY  05/08/2000   right salpingo-oophorectomy   LAMINOTOMY / EXCISION DISK POSTERIOR CERVICAL SPINE  08/10/2009   C7-T1   MENISCUS REPAIR Right 04/2021   Knee   POLYPECTOMY     [facility] 2012   ROUX-EN-Y GASTRIC BYPASS  05/19/2007   SHOULDER ARTHROSCOPY  01/02/2006; 05/31/2004   left 2007; right 2005   SHOULDER ARTHROSCOPY  07/02/2011   Procedure: ARTHROSCOPY SHOULDER;  Surgeon: Wyn Forster.,  MD;  Location: [facility];  Service: Orthopedics;  Laterality: Right;  repair supraspinatus, Debride glenohumeral joint, labrum   SHOULDER ARTHROSCOPY DISTAL CLAVICLE EXCISION AND OPEN ROTATOR CUFF REPAIR  07/26/2010   right   THYROID SURGERY  early 2000s   goiter   TOTAL 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/2021   Procedure: TOTAL KNEE ARTHROPLASTY;  Surgeon: Sheral Apley, MD;  Location: [facility];  Service: Orthopedics;   Laterality: Right;   TUBAL LIGATION  1993    Current Outpatient Medications  Medication Sig Dispense Refill   acetaminophen (TYLENOL) 500 MG tablet Take 2 tablets (1,000 mg total) by mouth every 6 (six) hours as needed for mild pain or moderate pain. 60 tablet 0   albuterol (VENTOLIN HFA) 108 (90 Base) MCG/ACT inhaler TAKE 2 PUFFS BY MOUTH EVERY 6 HOURS AS NEEDED FOR WHEEZE OR SHORTNESS OF BREATH 18 each 1   B Complex Vitamins (B COMPLEX PO) Take 1 tablet by mouth daily.     B-D UF III MINI PEN NEEDLES 31G X 5 MM MISC USE WITH INSULIN PEN AS DIRECTED 100 each 3   BIOTIN PO Take 1 tablet by mouth daily.     bisoprolol-hydrochlorothiazide (ZIAC) 10-6.25 MG tablet TAKE 1 TABLET BY MOUTH DAILY 100 tablet 2   Blood Glucose Monitoring Suppl (ONETOUCH VERIO) w/Device KIT 1 Device by Does not apply route daily. Use to check blood glucose 4 times day or prn 1 kit 0   Cholecalciferol (VITAMIN D3 PO) Take 1 tablet by mouth daily.     Continuous Blood Gluc Sensor (FREESTYLE LIBRE 3 SENSOR) MISC 1 Device by Does not apply route every 14 (fourteen) days. Place 1 sensor on the skin every 14 days. Use to check glucose continuously 6 each 3   DULoxetine (CYMBALTA) 30 MG capsule Take 30 mg by mouth every morning.     ferrous sulfate 325 (65 FE) MG EC tablet Take 325 mg by mouth daily with breakfast.     fluticasone (FLONASE) 50 MCG/ACT nasal spray USE 2 SPRAYS IN EACH NOSTRIL DAILY (Patient taking differently: 2 sprays daily as needed for allergies or rhinitis.) 48 g 3   gabapentin (NEURONTIN) 400 MG capsule TAKE 2 CAPSULES BY MOUTH 3 TIMES DAILY 600 capsule 2   HYDROcodone-acetaminophen (NORCO/VICODIN) 5-325 MG tablet Take 1 tablet by mouth every 8 (eight) hours as needed for moderate pain.     Lancet Devices MISC Use to check blood sugar TID 100 each 2   LANTUS SOLOSTAR 100 UNIT/ML Solostar Pen INJECT SUBCUTANEOUSLY 30 UNITS  DAILY 15 mL 6   levothyroxine (SYNTHROID) 112 MCG tablet TAKE 1 TABLET BY MOUTH DAILY  100 tablet 2   metFORMIN (GLUCOPHAGE) 1000 MG tablet TAKE 1 TABLET BY MOUTH DAILY  WITH BREAKFAST 100 tablet 2   ondansetron (ZOFRAN) 4 MG tablet Take 1 tablet (4 mg total) by mouth every 8 (eight) hours as needed for nausea or vomiting. 20 tablet 2   ondansetron (ZOFRAN-ODT) 4 MG disintegrating tablet Take 1 tablet (4 mg total) by mouth 2 (two) times daily as needed for nausea or vomiting. 10 tablet 0   ONE TOUCH LANCETS MISC Test blood sugars once daily Dx: E11.9 100 each 3   ONETOUCH VERIO test strip USE TO CHECK BLOOD SUGAR 4 TIMES DAILY OR AS NEEDED 400 strip 2   oxybutynin (DITROPAN-XL) 5 MG 24 hr tablet TAKE 1 TABLET BY MOUTH AT  BEDTIME 100 tablet 2   pantoprazole (PROTONIX) 40 MG tablet TAKE 1 TABLET  BY MOUTH DAILY 100 tablet 2   potassium chloride SA (KLOR-CON M) 20 MEQ tablet Take 1 tablet (20 mEq total) by mouth daily. 90 tablet 0   risperiDONE (RISPERDAL) 1 MG tablet Take 1 mg by mouth 2 (two) times daily.     simvastatin (ZOCOR) 20 MG tablet TAKE 1 TABLET BY MOUTH AT  BEDTIME 100 tablet 2   tirzepatide (MOUNJARO) 10 MG/0.5ML Pen Inject 10 mg into the skin once a week. 6 mL 0   traZODone (DESYREL) 100 MG tablet Take 200 mg by mouth at bedtime.     VITAMIN E PO Take 1 capsule by mouth daily.     No current facility-administered medications for this visit.    Allergies as of 07/04/2023 - Review Complete 07/04/2023  Allergen Reaction Noted   Shrimp (diagnostic) Anaphylaxis 10/29/2021   Ace inhibitors Cough 09/21/2010    Family History  Problem Relation Age of Onset   Diabetes Mother    Hyperlipidemia Mother    Hypertension Mother    Thyroid disease Mother    Rheum arthritis Mother    COPD Mother    Lung cancer Father        smoker   Prostate cancer Paternal Grandfather    Coronary artery disease Other    Colon cancer Neg Hx    Colon polyps Neg Hx    Esophageal cancer Neg Hx    Rectal cancer Neg Hx    Stomach cancer Neg Hx    Pancreatic cancer Neg Hx    Sleep apnea  Neg Hx     Social History   Socioeconomic History   Marital status: Married    Spouse name: Not on file   Number of children: 5   Years of education: Not on file   Highest education level: Bachelor's degree (e.g., BA, AB, BS)  Occupational History   Occupation: unemployed  Tobacco Use   Smoking status: Former    Current packs/day: 0.00    Average packs/day: 1 pack/day for 10.0 years (10.0 ttl pk-yrs)    Types: Cigarettes    Start date: 07/29/1974    Quit date: 07/29/1984    Years since quitting: 38.9   Smokeless tobacco: Never  Vaping Use   Vaping status: Never Used  Substance and Sexual Activity   Alcohol use: Yes    Comment: occassionally   Drug use: No   Sexual activity: Yes  Other Topics Concern   Not on file  Social History Narrative   Married for 32 years    Six children- all live around here. Two daughters live with her   10 grand children    On Disability for mental issues   No pets   Likes to go to movies and out to dinner. She likes to do puzzle books. Likes to read      She eats a healthy diet   She does not exercise    Lives at home with husband and granddaughter   Caffeine: 4 cups/day   Social Determinants of Health   Financial Resource Strain: Low Risk  (05/21/2023)   Overall Financial Resource Strain (CARDIA)    Difficulty of Paying Living Expenses: Not hard at all  Food Insecurity: No Food Insecurity (05/21/2023)   Hunger Vital Sign    Worried About Running Out of Food in the Last Year: Never true    Ran Out of Food in the Last Year: Never true  Transportation Needs: No Transportation Needs (05/21/2023)   PRAPARE - Transportation  Lack of Transportation (Medical): No    Lack of Transportation (Non-Medical): No  Physical Activity: Unknown (05/21/2023)   Exercise Vital Sign    Days of Exercise per Week: Patient declined    Minutes of Exercise per Session: 30 min  Recent Concern: Physical Activity - Insufficiently Active (03/14/2023)   Exercise  Vital Sign    Days of Exercise per Week: 3 days    Minutes of Exercise per Session: 30 min  Stress: No Stress Concern Present (05/21/2023)   [employer] of Occupational Health - Occupational Stress Questionnaire    Feeling of Stress : Not at all  Social Connections: Socially Integrated (05/21/2023)   Social Connection and Isolation Panel [NHANES]    Frequency of Communication with Friends and Family: More than three times a week    Frequency of Social Gatherings with Friends and Family: More than three times a week    Attends Religious Services: More than 4 times per year    Active Member of [employer] or Organizations: Yes    Attends Banker Meetings: More than 4 times per year    Marital Status: Married  Catering manager Violence: Not At Risk (03/14/2023)   Humiliation, Afraid, Rape, and Kick questionnaire    Fear of Current or Ex-Partner: No    Emotionally Abused: No    Physically Abused: No    Sexually Abused: No    Review of Systems:    Constitutional: No weight loss, fever, chills, weakness or fatigue HEENT: Eyes: No change in vision               Ears, Nose, Throat:  No change in hearing or congestion Skin: No rash or itching Cardiovascular: No chest pain, chest pressure or palpitations   Respiratory: No SOB or cough Gastrointestinal: See HPI and otherwise negative Genitourinary: No dysuria or change in urinary frequency Neurological: No headache, dizziness or syncope Musculoskeletal: No new muscle or joint pain Hematologic: No bleeding or bruising Psychiatric: No history of depression or anxiety    Physical Exam:  Vital signs: BP 138/78   Pulse 73   Ht 5' 1.5" (1.562 m)   Wt 167 lb (75.8 kg)   BMI 31.04 kg/m   Constitutional: NAD, Well developed, Well nourished, alert and cooperative Head:  Normocephalic and atraumatic. Eyes:   PEERL, EOMI. No icterus. Conjunctiva pink. Respiratory: Respirations even and unlabored. Lungs clear to auscultation  bilaterally.   No wheezes, crackles, or rhonchi.  Cardiovascular:  Regular rate and rhythm. No peripheral edema, cyanosis or pallor.  Gastrointestinal:  Soft, nondistended, nontender. No rebound or guarding. hypoactive bowel sounds. No appreciable masses or hepatomegaly. Rectal:  Not performed. Patient declined. Msk:  Symmetrical without gross deformities. Without edema, no deformity or joint abnormality.  Neurologic:  Alert and  oriented x4;  grossly normal neurologically.  Skin:   Dry and intact without significant lesions or rashes. Psychiatric: Oriented to person, place and time. Demonstrates good judgement and reason without abnormal affect or behaviors.   RELEVANT LABS AND IMAGING: CBC    Component Value Date/Time   WBC 10.7 (H) 05/22/2023 0833   RBC 4.39 05/22/2023 0833   HGB 13.9 05/22/2023 0833   HCT 41.2 05/22/2023 0833   PLT 377.0 05/22/2023 0833   MCV 94.0 05/22/2023 0833   MCH 31.4 06/06/2022 0349   MCHC 33.7 05/22/2023 0833   RDW 13.1 05/22/2023 0833   LYMPHSABS 3.7 02/19/2023 1338   MONOABS 1.0 02/19/2023 1338   EOSABS 0.2 02/19/2023 1338  BASOSABS 0.1 02/19/2023 1338    CMP     Component Value Date/Time   NA 138 05/22/2023 0833   K 2.7 (LL) 05/22/2023 0833   CL 93 (L) 05/22/2023 0833   CO2 32 05/22/2023 0833   GLUCOSE 371 (H) 05/22/2023 0833   BUN 4 (L) 05/22/2023 0833   CREATININE 0.77 05/22/2023 0833   CREATININE 0.60 09/19/2015 0932   CALCIUM 8.7 05/22/2023 0833   PROT 6.8 02/19/2023 1338   ALBUMIN 3.8 02/19/2023 1338   AST 26 02/19/2023 1338   ALT 22 02/19/2023 1338   ALKPHOS 73 02/19/2023 1338   BILITOT 0.6 02/19/2023 1338   GFRNONAA >60 06/06/2022 0349   GFRNONAA >89 09/19/2015 0932   GFRAA >89 09/19/2015 0932     Assessment/Plan:      Chronic Diarrhea Negative stool culture, negative CT scan.  Likely overflow diarrhea secondary to constipation, possibly exacerbated by [facility] use since the symptoms began soon after her Greggory Keen began..  Patient reports infrequent bowel movements (every 8 days) with subsequent watery stools. -Order abdominal x-ray to confirm constipation per patient request. -Start daily Miralax and titrate based on response. -Encourage increased water intake, aim for at least three bottles per day.  She currently drinks no water throughout the day and only Pepsi. -Recommend use of a squatty potty to aid bowel movements. - Follow-up 3 months  Nausea and Early Satiety Likely secondary to delayed gastric emptying from [facility] use.  Also likely a component of constipation.  Symptoms have worsened over time as Greggory Keen has increased in dosage.Marland Kitchen -- Discussed this is a likely side effect of the Mounjaro especially since it started after [facility] use - Could also be exacerbated by constipation as well. Continue to eat small meals and get constipation under control. If no improvement, can consider EGD.  Hypokalemia Potassium level was 2.7 on last lab draw. Patient is currently taking potassium supplements. -Draw labs today to check current potassium level.  Follow-up in three months to assess response to treatment and monitor symptoms.      Lara Mulch [facility] 07/04/2023, 11:16 AM  Cc: Shirline Frees, NP

## 2023-07-04 NOTE — Patient Instructions (Addendum)
Start taking Miralax 1 capful (17 grams) 1x / day for 1 week.   If this is not effective, increase to 1 dose 2x / day for 1 week.   If this is still not effective, increase to two capfuls (34 grams) 2x / day.   Can adjust dose as needed based on response. Can take 1/2 cap daily, skip days, or increase per day.    Your provider has requested that you have an abdominal x ray before leaving today. Please go to the basement floor to our Radiology department for the test.   Your provider has requested that you go to the basement level for lab work before leaving today. Press "B" on the elevator. The lab is located at the first door on the left as you exit the elevator.   Due to recent changes in healthcare laws, you may see the results of your imaging and laboratory studies on MyChart before your provider has had a chance to review them.  We understand that in some cases there may be results that are confusing or concerning to you. Not all laboratory results come back in the same time frame and the provider may be waiting for multiple results in order to interpret others.  Please give [place] 48 hours in order for your provider to thoroughly review all the results before contacting the office for clarification of your results.    I appreciate the  opportunity to care for you  Thank You   Bayley Filutowski [facility]

## 2023-07-15 ENCOUNTER — Encounter: Payer: Self-pay | Admitting: Adult Health

## 2023-07-17 DIAGNOSIS — E1140 Type 2 diabetes mellitus with diabetic neuropathy, unspecified: Secondary | ICD-10-CM | POA: Diagnosis not present

## 2023-07-17 DIAGNOSIS — G8929 Other chronic pain: Secondary | ICD-10-CM | POA: Diagnosis not present

## 2023-07-17 DIAGNOSIS — Z96659 Presence of unspecified artificial knee joint: Secondary | ICD-10-CM | POA: Diagnosis not present

## 2023-07-17 DIAGNOSIS — M25569 Pain in unspecified knee: Secondary | ICD-10-CM | POA: Diagnosis not present

## 2023-07-17 DIAGNOSIS — M25562 Pain in left knee: Secondary | ICD-10-CM | POA: Diagnosis not present

## 2023-07-17 DIAGNOSIS — G894 Chronic pain syndrome: Secondary | ICD-10-CM | POA: Diagnosis not present

## 2023-07-21 ENCOUNTER — Telehealth: Payer: Self-pay

## 2023-07-21 DIAGNOSIS — E1140 Type 2 diabetes mellitus with diabetic neuropathy, unspecified: Secondary | ICD-10-CM

## 2023-07-21 NOTE — Telephone Encounter (Signed)
Copied from CRM [id number]. Topic: Clinical - Prescription Issue >> Jul 21, 2023  2:37 PM Orinda Kenner C wrote: Reason for CRM: Gretta Began from [facility] [phone] detailed written order/script for Continuous Blood Gluc Sensor (FREESTYLE LIBRE 3 SENSOR) back date in October or earlier date? Faxed # [phone]

## 2023-07-22 ENCOUNTER — Other Ambulatory Visit: Payer: Self-pay

## 2023-07-22 DIAGNOSIS — E1140 Type 2 diabetes mellitus with diabetic neuropathy, unspecified: Secondary | ICD-10-CM

## 2023-07-22 NOTE — Telephone Encounter (Signed)
Spoke to pt and she stated that her sensors need to go to [facility] now. Will fax and send to pharmacy.

## 2023-07-24 MED ORDER — FREESTYLE LIBRE 3 SENSOR MISC: 1.0000 | 3 refills | Status: DC

## 2023-07-24 NOTE — Telephone Encounter (Signed)
Prescription has been faxed to requested DME store.

## 2023-07-24 NOTE — Addendum Note (Signed)
Addended by: Waymon Amato R on: 07/24/2023 08:32 AM   Modules accepted: Orders

## 2023-07-28 ENCOUNTER — Other Ambulatory Visit (HOSPITAL_COMMUNITY): Payer: Self-pay

## 2023-07-28 ENCOUNTER — Other Ambulatory Visit: Payer: Self-pay | Admitting: Adult Health

## 2023-07-29 ENCOUNTER — Other Ambulatory Visit (HOSPITAL_COMMUNITY): Payer: Self-pay

## 2023-07-29 MED ORDER — MOUNJARO 10 MG/0.5ML ~~LOC~~ SOAJ
10.0000 mg | SUBCUTANEOUS | 0 refills | Status: DC
  Filled 2023-07-29: qty 6, 84d supply, fill #0

## 2023-08-04 ENCOUNTER — Encounter: Payer: Self-pay | Admitting: Adult Health

## 2023-08-04 ENCOUNTER — Other Ambulatory Visit (HOSPITAL_COMMUNITY): Payer: Self-pay

## 2023-08-04 DIAGNOSIS — E1140 Type 2 diabetes mellitus with diabetic neuropathy, unspecified: Secondary | ICD-10-CM

## 2023-08-05 ENCOUNTER — Other Ambulatory Visit: Payer: Self-pay

## 2023-08-05 ENCOUNTER — Other Ambulatory Visit (HOSPITAL_COMMUNITY): Payer: Self-pay

## 2023-08-05 DIAGNOSIS — E039 Hypothyroidism, unspecified: Secondary | ICD-10-CM

## 2023-08-05 DIAGNOSIS — M792 Neuralgia and neuritis, unspecified: Secondary | ICD-10-CM

## 2023-08-05 DIAGNOSIS — I10 Essential (primary) hypertension: Secondary | ICD-10-CM

## 2023-08-05 DIAGNOSIS — E1140 Type 2 diabetes mellitus with diabetic neuropathy, unspecified: Secondary | ICD-10-CM

## 2023-08-05 DIAGNOSIS — E876 Hypokalemia: Secondary | ICD-10-CM

## 2023-08-05 DIAGNOSIS — E7800 Pure hypercholesterolemia, unspecified: Secondary | ICD-10-CM

## 2023-08-05 MED ORDER — OXYBUTYNIN CHLORIDE ER 5 MG PO TB24
5.0000 mg | ORAL_TABLET | Freq: Every day | ORAL | 2 refills | Status: DC
  Filled 2023-08-05: qty 100, 100d supply, fill #0
  Filled 2023-11-30: qty 100, 100d supply, fill #1
  Filled 2024-03-22: qty 100, 100d supply, fill #2

## 2023-08-05 MED ORDER — FREESTYLE LIBRE 3 SENSOR MISC: 1.0000 | 3 refills | Status: DC

## 2023-08-05 MED ORDER — MOUNJARO 10 MG/0.5ML ~~LOC~~ SOAJ
10.0000 mg | SUBCUTANEOUS | 0 refills | Status: DC
  Filled 2023-08-05: qty 6, 84d supply, fill #0

## 2023-08-05 MED ORDER — BISOPROLOL-HYDROCHLOROTHIAZIDE 10-6.25 MG PO TABS
1.0000 | ORAL_TABLET | Freq: Every day | ORAL | 2 refills | Status: DC
  Filled 2023-08-05: qty 100, 100d supply, fill #0
  Filled 2023-11-30: qty 100, 100d supply, fill #1
  Filled 2024-03-22: qty 100, 100d supply, fill #2

## 2023-08-05 MED ORDER — GABAPENTIN 400 MG PO CAPS
800.0000 mg | ORAL_CAPSULE | Freq: Three times a day (TID) | ORAL | 2 refills | Status: DC
  Filled 2023-08-05: qty 600, 100d supply, fill #0
  Filled 2023-10-29: qty 600, 100d supply, fill #1
  Filled 2024-02-23: qty 600, 100d supply, fill #2

## 2023-08-05 MED ORDER — SIMVASTATIN 20 MG PO TABS
20.0000 mg | ORAL_TABLET | Freq: Every day | ORAL | 2 refills | Status: DC
  Filled 2023-08-05: qty 100, 100d supply, fill #0
  Filled 2023-11-30: qty 100, 100d supply, fill #1
  Filled 2024-03-22: qty 100, 100d supply, fill #2

## 2023-08-05 MED ORDER — POTASSIUM CHLORIDE CRYS ER 20 MEQ PO TBCR
20.0000 meq | EXTENDED_RELEASE_TABLET | Freq: Every day | ORAL | 0 refills | Status: DC
  Filled 2023-08-05: qty 90, 90d supply, fill #0

## 2023-08-05 MED ORDER — LEVOTHYROXINE SODIUM 112 MCG PO TABS
112.0000 ug | ORAL_TABLET | Freq: Every day | ORAL | 2 refills | Status: DC
  Filled 2023-08-05: qty 100, 100d supply, fill #0

## 2023-08-05 NOTE — Telephone Encounter (Signed)
 All prescriptions sent to [facility].

## 2023-08-06 ENCOUNTER — Other Ambulatory Visit: Payer: Self-pay | Admitting: Adult Health

## 2023-08-06 ENCOUNTER — Telehealth: Payer: Self-pay

## 2023-08-06 ENCOUNTER — Other Ambulatory Visit (HOSPITAL_COMMUNITY): Payer: Self-pay

## 2023-08-06 DIAGNOSIS — E1140 Type 2 diabetes mellitus with diabetic neuropathy, unspecified: Secondary | ICD-10-CM

## 2023-08-06 MED ORDER — ONETOUCH VERIO W/DEVICE KIT: 1.0000 | PACK | Freq: Every day | 0 refills | Status: DC

## 2023-08-06 MED ORDER — ONETOUCH VERIO FLEX SYSTEM W/DEVICE KIT
1.0000 | PACK | Freq: Every day | 0 refills | Status: AC
  Filled 2023-10-01: qty 1, 30d supply, fill #0

## 2023-08-06 MED ORDER — ONETOUCH VERIO VI STRP: ORAL_STRIP | 2 refills | Status: DC

## 2023-08-06 MED ORDER — ONETOUCH VERIO VI STRP
ORAL_STRIP | 2 refills | Status: DC
  Filled 2023-08-13: qty 400, 100d supply, fill #0
  Filled 2023-10-29: qty 400, 100d supply, fill #1
  Filled 2023-12-26 – 2024-01-02 (×4): qty 400, 100d supply, fill #2

## 2023-08-06 NOTE — Telephone Encounter (Signed)
 Copied from CRM [id number]. Topic: Clinical - Medication Refill >> Aug 06, 2023 12:48 PM Macario HERO wrote: Most Recent Primary Care Visit:  Provider: MERNA HUXLEY  Department: [facility]  Visit Type: OFFICE VISIT  Date: 05/22/2023  Medication: AISHA SINKS test strip [[id number]] AND Blood Glucose Monitoring Suppl [facility] VERIO) w/Device KIT [[id number]]  Has the patient contacted their pharmacy? Yes (Agent: If no, request that the patient contact the pharmacy for the refill. If patient does not wish to contact the pharmacy document the reason why and proceed with request.) (Agent: If yes, when and what did the pharmacy advise?)  Is this the correct pharmacy for this prescription? Yes If no, delete pharmacy and type the correct one.  This is the patient's preferred pharmacy:   [facility] - [facility] [address] [place] [address] Phone: [phone] Fax: [phone]   Has the prescription been filled recently? Yes  Is the patient out of the medication?   Has the patient been seen for an appointment in the last year OR does the patient have an upcoming appointment? Yes  Can we respond through MyChart? Pharmacy rep said patient did not say.  Agent: Please be advised that Rx refills may take up to 3 business days. We ask that you follow-up with your pharmacy.

## 2023-08-06 NOTE — Addendum Note (Signed)
 Addended by: Waymon Amato R on: 08/06/2023 03:51 PM   Modules accepted: Orders

## 2023-08-08 ENCOUNTER — Other Ambulatory Visit (HOSPITAL_COMMUNITY): Payer: Self-pay

## 2023-08-12 ENCOUNTER — Encounter: Payer: Self-pay | Admitting: Adult Health

## 2023-08-12 ENCOUNTER — Other Ambulatory Visit (HOSPITAL_COMMUNITY): Payer: Self-pay

## 2023-08-12 DIAGNOSIS — E1140 Type 2 diabetes mellitus with diabetic neuropathy, unspecified: Secondary | ICD-10-CM

## 2023-08-12 MED ORDER — FREESTYLE LIBRE 3 SENSOR MISC
1.0000 | 3 refills | Status: DC
  Filled 2023-08-14: qty 6, 84d supply, fill #0
  Filled 2023-10-01 – 2023-10-29 (×2): qty 6, 84d supply, fill #1
  Filled 2023-12-26 – 2023-12-29 (×2): qty 6, 84d supply, fill #2

## 2023-08-13 ENCOUNTER — Other Ambulatory Visit: Payer: Self-pay | Admitting: Adult Health

## 2023-08-13 ENCOUNTER — Other Ambulatory Visit (HOSPITAL_COMMUNITY): Payer: Self-pay

## 2023-08-13 ENCOUNTER — Telehealth: Payer: Self-pay | Admitting: Adult Health

## 2023-08-13 DIAGNOSIS — E1140 Type 2 diabetes mellitus with diabetic neuropathy, unspecified: Secondary | ICD-10-CM

## 2023-08-13 NOTE — Telephone Encounter (Signed)
Copied from CRM [id number]. Topic: Clinical - Medication Refill >> Aug 13, 2023 10:40 AM Florestine Avers wrote: Most Recent Primary Care Visit:  Provider: Shirline Frees  Department: [facility]  Visit Type: OFFICE VISIT  Date: 05/22/2023  Medication: Continuous Glucose Sensor (FREESTYLE LIBRE 3 SENSOR) MISC  Has the patient contacted their pharmacy? Yes (Agent: If no, request that the patient contact the pharmacy for the refill. If patient does not wish to contact the pharmacy document the reason why and proceed with request.) (Agent: If yes, when and what did the pharmacy advise?)  Is this the correct pharmacy for this prescription? Yes If no, delete pharmacy and type the correct one.  This is the patient's preferred pharmacy:  [facility] - [facility] [address] [place] [address] Phone: [phone] Fax: [phone]    Has the prescription been filled recently? No  Is the patient out of the medication? Yes  Has the patient been seen for an appointment in the last year OR does the patient have an upcoming appointment? Yes  Can we respond through MyChart? Yes  Agent: Please be advised that Rx refills may take up to 3 business days. We ask that you follow-up with your pharmacy.

## 2023-08-13 NOTE — Telephone Encounter (Signed)
Copied from CRM [id number]. Topic: Clinical - Prescription Issue >> Aug 13, 2023  9:25 AM Marica Otter wrote: Reason for CRM: Melissa calling because they received order for the Continuous Glucose Sensor (FREESTYLE LIBRE 3 SENSOR) but it wasn't signed by the provider, she will be sending order back through fax to have provider sign off.  Melissa [phone]

## 2023-08-14 ENCOUNTER — Other Ambulatory Visit: Payer: Self-pay

## 2023-08-14 ENCOUNTER — Other Ambulatory Visit (HOSPITAL_COMMUNITY): Payer: Self-pay

## 2023-08-14 DIAGNOSIS — E1140 Type 2 diabetes mellitus with diabetic neuropathy, unspecified: Secondary | ICD-10-CM

## 2023-08-14 MED ORDER — METFORMIN HCL 1000 MG PO TABS
1000.0000 mg | ORAL_TABLET | Freq: Every day | ORAL | 2 refills | Status: DC
  Filled 2023-08-14: qty 100, 100d supply, fill #0
  Filled 2023-11-30: qty 100, 100d supply, fill #1
  Filled 2024-03-22: qty 100, 100d supply, fill #2

## 2023-08-14 NOTE — Telephone Encounter (Signed)
This has been taking care of. No further action needed!  

## 2023-08-15 ENCOUNTER — Other Ambulatory Visit: Payer: Self-pay

## 2023-08-15 NOTE — Telephone Encounter (Signed)
This was taken care of already.

## 2023-08-17 ENCOUNTER — Other Ambulatory Visit: Payer: Self-pay | Admitting: Adult Health

## 2023-08-17 DIAGNOSIS — E876 Hypokalemia: Secondary | ICD-10-CM

## 2023-08-18 DIAGNOSIS — Z79899 Other long term (current) drug therapy: Secondary | ICD-10-CM | POA: Diagnosis not present

## 2023-08-18 DIAGNOSIS — Z96659 Presence of unspecified artificial knee joint: Secondary | ICD-10-CM | POA: Diagnosis not present

## 2023-08-18 DIAGNOSIS — Z6830 Body mass index (BMI) 30.0-30.9, adult: Secondary | ICD-10-CM | POA: Diagnosis not present

## 2023-08-18 DIAGNOSIS — F32A Depression, unspecified: Secondary | ICD-10-CM | POA: Diagnosis not present

## 2023-08-18 DIAGNOSIS — G894 Chronic pain syndrome: Secondary | ICD-10-CM | POA: Diagnosis not present

## 2023-08-18 DIAGNOSIS — M25562 Pain in left knee: Secondary | ICD-10-CM | POA: Diagnosis not present

## 2023-08-18 DIAGNOSIS — M25569 Pain in unspecified knee: Secondary | ICD-10-CM | POA: Diagnosis not present

## 2023-08-18 DIAGNOSIS — E1140 Type 2 diabetes mellitus with diabetic neuropathy, unspecified: Secondary | ICD-10-CM | POA: Diagnosis not present

## 2023-08-19 ENCOUNTER — Ambulatory Visit (INDEPENDENT_AMBULATORY_CARE_PROVIDER_SITE_OTHER): Payer: Self-pay | Admitting: Adult Health

## 2023-08-19 ENCOUNTER — Other Ambulatory Visit (HOSPITAL_COMMUNITY): Payer: Self-pay

## 2023-08-19 ENCOUNTER — Telehealth: Payer: Self-pay

## 2023-08-19 ENCOUNTER — Encounter: Payer: Self-pay | Admitting: Adult Health

## 2023-08-19 ENCOUNTER — Other Ambulatory Visit: Payer: Self-pay

## 2023-08-19 VITALS — BP 110/70 | HR 91 | Temp 97.6°F | Ht 61.5 in | Wt 164.0 lb

## 2023-08-19 DIAGNOSIS — E876 Hypokalemia: Secondary | ICD-10-CM | POA: Diagnosis not present

## 2023-08-19 DIAGNOSIS — E1140 Type 2 diabetes mellitus with diabetic neuropathy, unspecified: Secondary | ICD-10-CM | POA: Diagnosis not present

## 2023-08-19 DIAGNOSIS — Z7984 Long term (current) use of oral hypoglycemic drugs: Secondary | ICD-10-CM

## 2023-08-19 DIAGNOSIS — M150 Primary generalized (osteo)arthritis: Secondary | ICD-10-CM | POA: Diagnosis not present

## 2023-08-19 DIAGNOSIS — Z7985 Long-term (current) use of injectable non-insulin antidiabetic drugs: Secondary | ICD-10-CM

## 2023-08-19 DIAGNOSIS — E119 Type 2 diabetes mellitus without complications: Secondary | ICD-10-CM

## 2023-08-19 DIAGNOSIS — I10 Essential (primary) hypertension: Secondary | ICD-10-CM | POA: Diagnosis not present

## 2023-08-19 DIAGNOSIS — Z794 Long term (current) use of insulin: Secondary | ICD-10-CM | POA: Diagnosis not present

## 2023-08-19 LAB — CBC
HCT: 41.6 % (ref 36.0–46.0)
Hemoglobin: 13.8 g/dL (ref 12.0–15.0)
MCHC: 33.3 g/dL (ref 30.0–36.0)
MCV: 94.7 fL (ref 78.0–100.0)
Platelets: 381 10*3/uL (ref 150.0–400.0)
RBC: 4.39 Mil/uL (ref 3.87–5.11)
RDW: 12.9 % (ref 11.5–15.5)
WBC: 9.6 10*3/uL (ref 4.0–10.5)

## 2023-08-19 LAB — POCT GLYCOSYLATED HEMOGLOBIN (HGB A1C): Hemoglobin A1C: 7.3 % — AB (ref 4.0–5.6)

## 2023-08-19 LAB — BASIC METABOLIC PANEL
BUN: 3 mg/dL — ABNORMAL LOW (ref 6–23)
CO2: 31 meq/L (ref 19–32)
Calcium: 9.3 mg/dL (ref 8.4–10.5)
Chloride: 99 meq/L (ref 96–112)
Creatinine, Ser: 0.77 mg/dL (ref 0.40–1.20)
GFR: 82.1 mL/min (ref >60.00)
Glucose, Bld: 281 mg/dL — ABNORMAL HIGH (ref 70–99)
Potassium: 3.4 meq/L — ABNORMAL LOW (ref 3.5–5.1)
Sodium: 138 meq/L (ref 135–145)

## 2023-08-19 MED ORDER — TIRZEPATIDE 12.5 MG/0.5ML ~~LOC~~ SOAJ
12.5000 mg | SUBCUTANEOUS | 0 refills | Status: DC
  Filled 2023-08-19: qty 2, 28d supply, fill #0
  Filled 2023-09-01 – 2023-09-09 (×3): qty 2, 28d supply, fill #1
  Filled 2023-10-01: qty 2, 28d supply, fill #2

## 2023-08-19 MED ORDER — MELOXICAM 7.5 MG PO TABS
7.5000 mg | ORAL_TABLET | Freq: Every day | ORAL | 1 refills | Status: DC
  Filled 2023-08-19: qty 90, 90d supply, fill #0
  Filled 2023-10-01 – 2023-10-29 (×2): qty 90, 90d supply, fill #1

## 2023-08-19 MED ORDER — LANTUS SOLOSTAR 100 UNIT/ML ~~LOC~~ SOPN
30.0000 [IU] | PEN_INJECTOR | Freq: Every day | SUBCUTANEOUS | 6 refills | Status: AC
  Filled 2023-08-19: qty 15, 50d supply, fill #0
  Filled 2023-10-01: qty 15, 50d supply, fill #1
  Filled 2023-11-30: qty 15, 50d supply, fill #2
  Filled 2024-01-15: qty 15, 50d supply, fill #3
  Filled 2024-03-22: qty 15, 50d supply, fill #4
  Filled 2024-05-23: qty 15, 50d supply, fill #5
  Filled 2024-06-07 – 2024-07-18 (×2): qty 15, 50d supply, fill #6

## 2023-08-19 NOTE — Progress Notes (Signed)
Subjective:    Patient ID: Alexandra [name], female    DOB: [birth year], 63 y.o.   MRN: [id number]  HPI he presents to the office today for three month follow up regarding DM and HTN   Dabetes mellitus type II-managed with Mounjaro 10 mg weekly, metformin 1000 mg daily, and Lantus 30 units daily.  She uses the [place] sensor to manage her blood sugars. She continues to drink multiple soft drinks a day.  Lab Results  Component Value Date   HGBA1C 7.3 (A) 08/19/2023   HGBA1C 7.6 (A) 05/22/2023   HGBA1C 9.2 (A) 02/19/2023   Wt Readings from Last 3 Encounters:  08/19/23 164 lb (74.4 kg)  07/04/23 167 lb (75.8 kg)  05/22/23 168 lb (76.2 kg)    Hypertension - managed with Ziac 10-6.25  mg daily.  BP Readings from Last 3 Encounters:  08/19/23 110/70  07/04/23 138/78  05/22/23 120/70    Hypokalemia - her potassium level was 3.3 in December at GI. She is no longer having GI issues.   Osteoarthritis of multiple joints - needs a refill of Mobic 7.5.   Review of Systems See HPI   Past Medical History:  Diagnosis Date   Allergy    Anemia    past hx   Blood transfusion without reported diagnosis    Diabetes mellitus (HCC)    Fatty liver    GERD (gastroesophageal reflux disease)    prn med.   Hiatal hernia 07/29/1996   Hyperlipidemia    Hyperplastic colon polyp    Hypertension    new dx. - will start med. 06/28/2011   Hypothyroidism    Neuromuscular disorder (HCC)    hemiplegia , neuropathy   Rotator cuff tear, right    Stroke [facility])    TIA 2016    Social History   Socioeconomic History   Marital status: Married    Spouse name: Not on file   Number of children: 5   Years of education: Not on file   Highest education level: Bachelor's degree (e.g., BA, AB, BS)  Occupational History   Occupation: unemployed  Tobacco Use   Smoking status: Former    Current packs/day: 0.00    Average packs/day: 1 pack/day for 10.0 years (10.0 ttl pk-yrs)    Types: Cigarettes     Start date: 07/29/1974    Quit date: 07/29/1984    Years since quitting: 39.0   Smokeless tobacco: Never  Vaping Use   Vaping status: Never Used  Substance and Sexual Activity   Alcohol use: Yes    Comment: occassionally   Drug use: No   Sexual activity: Yes  Other Topics Concern   Not on file  Social History Narrative   Married for 32 years    Six children- all live around here. Two daughters live with her   10 grand children    On Disability for mental issues   No pets   Likes to go to movies and out to dinner. She likes to do puzzle books. Likes to read      She eats a healthy diet   She does not exercise    Lives at home with husband and granddaughter   Caffeine: 4 cups/day   Social Drivers of Corporate investment banker Strain: Low Risk  (05/21/2023)   Overall Financial Resource Strain (CARDIA)    Difficulty of Paying Living Expenses: Not hard at all  Food Insecurity: No Food Insecurity (05/21/2023)   Hunger Vital  Sign    Worried About Programme researcher, broadcasting/film/video in the Last Year: Never true    Ran Out of Food in the Last Year: Never true  Transportation Needs: No Transportation Needs (05/21/2023)   PRAPARE - Administrator, Civil Service (Medical): No    Lack of Transportation (Non-Medical): No  Physical Activity: Unknown (05/21/2023)   Exercise Vital Sign    Days of Exercise per Week: Patient declined    Minutes of Exercise per Session: 30 min  Recent Concern: Physical Activity - Insufficiently Active (03/14/2023)   Exercise Vital Sign    Days of Exercise per Week: 3 days    Minutes of Exercise per Session: 30 min  Stress: No Stress Concern Present (05/21/2023)   [employer] of Occupational Health - Occupational Stress Questionnaire    Feeling of Stress : Not at all  Social Connections: Socially Integrated (05/21/2023)   Social Connection and Isolation Panel [NHANES]    Frequency of Communication with Friends and Family: More than three times a week     Frequency of Social Gatherings with Friends and Family: More than three times a week    Attends Religious Services: More than 4 times per year    Active Member of Clubs or Organizations: Yes    Attends Banker Meetings: More than 4 times per year    Marital Status: Married  Catering manager Violence: Not At Risk (03/14/2023)   Humiliation, Afraid, Rape, and Kick questionnaire    Fear of Current or Ex-Partner: No    Emotionally Abused: No    Physically Abused: No    Sexually Abused: No    Past Surgical History:  Procedure Laterality Date   ABDOMINAL HYSTERECTOMY  06/25/2000   left salpingo-oophorectomy   BACK SURGERY  07/30/1995   discetomy, lumbar spinal fusion   CARDIAC CATHETERIZATION  04/01/2006   CESAREAN SECTION  07/29/1989   CHOLECYSTECTOMY  07/30/1987   COLONOSCOPY     EXPLORATORY LAPAROTOMY  05/08/2000   right salpingo-oophorectomy   LAMINOTOMY / EXCISION DISK POSTERIOR CERVICAL SPINE  08/10/2009   C7-T1   MENISCUS REPAIR Right 04/2021   Knee   POLYPECTOMY     [facility] 2012   ROUX-EN-Y GASTRIC BYPASS  05/19/2007   SHOULDER ARTHROSCOPY  01/02/2006; 05/31/2004   left 2007; right 2005   SHOULDER ARTHROSCOPY  07/02/2011   Procedure: ARTHROSCOPY SHOULDER;  Surgeon: Wyn Forster., MD;  Location: [facility];  Service: Orthopedics;  Laterality: Right;  repair supraspinatus, Debride glenohumeral joint, labrum   SHOULDER ARTHROSCOPY DISTAL CLAVICLE EXCISION AND OPEN ROTATOR CUFF REPAIR  07/26/2010   right   THYROID SURGERY  early 2000s   goiter   TOTAL 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/2021   Procedure: TOTAL KNEE ARTHROPLASTY;  Surgeon: Sheral Apley, MD;  Location: [facility];  Service: Orthopedics;  Laterality: Right;   TUBAL LIGATION  1993    Family History  Problem Relation Age of Onset   Diabetes Mother    Hyperlipidemia Mother    Hypertension Mother    Thyroid disease Mother    Rheum arthritis Mother    COPD Mother    Lung cancer  Father        smoker   Prostate cancer Paternal Grandfather    Coronary artery disease Other    Colon cancer Neg Hx    Colon polyps Neg Hx    Esophageal cancer Neg Hx    Rectal cancer Neg Hx    Stomach cancer  Neg Hx    Pancreatic cancer Neg Hx    Sleep apnea Neg Hx     Allergies  Allergen Reactions   Shrimp (Diagnostic) Anaphylaxis    Eating shrimp causes swelling of tongue and rash   Ace Inhibitors Cough    Current Outpatient Medications on File Prior to Visit  Medication Sig Dispense Refill   acetaminophen (TYLENOL) 500 MG tablet Take 2 tablets (1,000 mg total) by mouth every 6 (six) hours as needed for mild pain or moderate pain. 60 tablet 0   albuterol (VENTOLIN HFA) 108 (90 Base) MCG/ACT inhaler TAKE 2 PUFFS BY MOUTH EVERY 6 HOURS AS NEEDED FOR WHEEZE OR SHORTNESS OF BREATH 18 each 1   B Complex Vitamins (B COMPLEX PO) Take 1 tablet by mouth daily.     B-D UF III MINI PEN NEEDLES 31G X 5 MM MISC USE WITH INSULIN PEN AS DIRECTED 100 each 3   BIOTIN PO Take 1 tablet by mouth daily.     bisoprolol-hydrochlorothiazide (ZIAC) 10-6.25 MG tablet Take 1 tablet by mouth daily. 100 tablet 2   Blood Glucose Monitoring Suppl (ONETOUCH VERIO FLEX SYSTEM) w/Device KIT use to check blood glucose 4 times daily or as needed 1 kit 0   Cholecalciferol (VITAMIN D3 PO) Take 1 tablet by mouth daily.     Continuous Glucose Sensor (FREESTYLE LIBRE 3 SENSOR) MISC Place 1 sensor on the skin every 14 days. Use to check glucose continuously 6 each 3   DULoxetine (CYMBALTA) 30 MG capsule Take 30 mg by mouth every morning.     ferrous sulfate 325 (65 FE) MG EC tablet Take 325 mg by mouth daily with breakfast.     fluticasone (FLONASE) 50 MCG/ACT nasal spray USE 2 SPRAYS IN EACH NOSTRIL DAILY (Patient taking differently: 2 sprays daily as needed for allergies or rhinitis.) 48 g 3   gabapentin (NEURONTIN) 400 MG capsule Take 2 capsules (800 mg total) by mouth 3 (three) times daily. 600 capsule 2   glucose  blood (ONETOUCH VERIO) test strip USE TO CHECK BLOOD SUGAR 4 TIMES DAILY OR AS NEEDED 400 strip 2   HYDROcodone-acetaminophen (NORCO/VICODIN) 5-325 MG tablet Take 1 tablet by mouth every 8 (eight) hours as needed for moderate pain.     Lancet Devices MISC Use to check blood sugar TID 100 each 2   LANTUS SOLOSTAR 100 UNIT/ML Solostar Pen INJECT SUBCUTANEOUSLY 30 UNITS  DAILY 15 mL 6   levothyroxine (SYNTHROID) 112 MCG tablet Take 1 tablet (112 mcg total) by mouth daily. 100 tablet 2   metFORMIN (GLUCOPHAGE) 1000 MG tablet Take 1 tablet (1,000 mg total) by mouth daily with breakfast. 100 tablet 2   ondansetron (ZOFRAN) 4 MG tablet Take 1 tablet (4 mg total) by mouth every 8 (eight) hours as needed for nausea or vomiting. 20 tablet 2   ondansetron (ZOFRAN-ODT) 4 MG disintegrating tablet Take 1 tablet (4 mg total) by mouth 2 (two) times daily as needed for nausea or vomiting. 10 tablet 0   ONE TOUCH LANCETS MISC Test blood sugars once daily Dx: E11.9 100 each 3   oxybutynin (DITROPAN-XL) 5 MG 24 hr tablet Take 1 tablet (5 mg total) by mouth at bedtime. 100 tablet 2   pantoprazole (PROTONIX) 40 MG tablet TAKE 1 TABLET BY MOUTH DAILY 100 tablet 2   potassium chloride SA (KLOR-CON M) 20 MEQ tablet Take 1 tablet (20 mEq total) by mouth daily. 90 tablet 0   risperiDONE (RISPERDAL) 1 MG tablet Take 1  mg by mouth 2 (two) times daily.     simvastatin (ZOCOR) 20 MG tablet Take 1 tablet (20 mg total) by mouth at bedtime. 100 tablet 2   traZODone (DESYREL) 100 MG tablet Take 200 mg by mouth at bedtime.     VITAMIN E PO Take 1 capsule by mouth daily.     No current facility-administered medications on file prior to visit.    BP 110/70   Pulse 91   Temp 97.6 F (36.4 C) (Oral)   Ht 5' 1.5" (1.562 m)   Wt 164 lb (74.4 kg)   SpO2 98%   BMI 30.49 kg/m       Objective:   Physical Exam Vitals and nursing note reviewed.  Constitutional:      Appearance: Normal appearance.  Cardiovascular:     Rate and  Rhythm: Normal rate and regular rhythm.     Pulses: Normal pulses.     Heart sounds: Normal heart sounds.  Pulmonary:     Effort: Pulmonary effort is normal.     Breath sounds: Normal breath sounds.  Musculoskeletal:        General: Normal range of motion.  Skin:    General: Skin is warm and dry.  Neurological:     General: No focal deficit present.     Mental Status: She is alert and oriented to person, place, and time.  Psychiatric:        Mood and Affect: Mood normal.        Behavior: Behavior normal.        Thought Content: Thought content normal.        Judgment: Judgment normal.       Assessment & Plan:  1. Type 2 diabetes mellitus with diabetic neuropathy, without long-term current use of insulin (HCC) (Primary)  - POC HgB A1c- 7.3 - imroved but not at goal - Encouraged to cut back on soda.  - insulin glargine (LANTUS SOLOSTAR) 100 UNIT/ML Solostar Pen; Inject 30 Units into the skin daily.  Dispense: 15 mL; Refill: 6 - Follow up in 3 months for CPE   2. Diabetes mellitus treated with oral medication (HCC) - continue with Metformin   3. Long-term current use of injectable noninsulin antidiabetic medication - Will increase Mounjaro to 12.5 mg  - tirzepatide (MOUNJARO) 12.5 MG/0.5ML Pen; Inject 12.5 mg into the skin once a week.  Dispense: 6 mL; Refill: 0  5. Essential hypertension - Well controlled. No change in medication   6. Primary osteoarthritis involving multiple joints  - meloxicam (MOBIC) 7.5 MG tablet; Take 1 tablet (7.5 mg total) by mouth daily.  Dispense: 90 tablet; Refill: 1  7. Hypokalemia - Consider increasing Potassium  - Basic Metabolic Panel; Future  Shirline Frees, NP

## 2023-08-19 NOTE — Patient Instructions (Signed)
Health Maintenance Due  Topic Date Due   INFLUENZA VACCINE  02/27/2023   COVID-19 Vaccine (6 - 2024-25 season) 03/30/2023       03/14/2023    9:30 AM 05/03/2022    8:14 AM 03/08/2022    3:40 PM  Depression screen PHQ 2/9  Decreased Interest 0 0 0  Down, Depressed, Hopeless 0 0 0  PHQ - 2 Score 0 0 0  Altered sleeping  0   Tired, decreased energy  0   Change in appetite  0   Feeling bad or failure about yourself   0   Trouble concentrating  0   Moving slowly or fidgety/restless  0   Suicidal thoughts  0   PHQ-9 Score  0   Difficult doing work/chores  Not difficult at all

## 2023-08-19 NOTE — Telephone Encounter (Signed)
Copied from CRM [id number]. Topic: General - Other >> Aug 19, 2023  8:23 AM Truddie Crumble wrote: Reason for [id number] from [facility] called stating she is following up on a fax in regards to getting additional information from the provider. SWL to be faxed back and most recent office notes CB [phone]

## 2023-08-21 ENCOUNTER — Encounter: Payer: Self-pay | Admitting: Adult Health

## 2023-08-21 DIAGNOSIS — F32A Depression, unspecified: Secondary | ICD-10-CM

## 2023-08-21 NOTE — Telephone Encounter (Signed)
Ok for referral?

## 2023-08-22 NOTE — Telephone Encounter (Signed)
Pt is no longer using this pharmacy.

## 2023-09-01 ENCOUNTER — Other Ambulatory Visit (HOSPITAL_COMMUNITY): Payer: Self-pay

## 2023-09-02 ENCOUNTER — Telehealth (HOSPITAL_COMMUNITY): Payer: HMO | Admitting: Psychiatry

## 2023-09-02 ENCOUNTER — Encounter (HOSPITAL_COMMUNITY): Payer: Self-pay | Admitting: Psychiatry

## 2023-09-02 ENCOUNTER — Other Ambulatory Visit (HOSPITAL_COMMUNITY): Payer: Self-pay

## 2023-09-02 VITALS — Wt 164.0 lb

## 2023-09-02 DIAGNOSIS — F250 Schizoaffective disorder, bipolar type: Secondary | ICD-10-CM

## 2023-09-02 DIAGNOSIS — F5101 Primary insomnia: Secondary | ICD-10-CM

## 2023-09-02 DIAGNOSIS — F411 Generalized anxiety disorder: Secondary | ICD-10-CM | POA: Diagnosis not present

## 2023-09-02 MED ORDER — RISPERIDONE 1 MG PO TABS
1.0000 mg | ORAL_TABLET | Freq: Two times a day (BID) | ORAL | 1 refills | Status: DC
  Filled 2023-09-02: qty 60, 30d supply, fill #0

## 2023-09-02 MED ORDER — TRAZODONE HCL 100 MG PO TABS
200.0000 mg | ORAL_TABLET | Freq: Every day | ORAL | 1 refills | Status: DC
  Filled 2023-09-02: qty 60, 30d supply, fill #0

## 2023-09-02 MED ORDER — DULOXETINE HCL 30 MG PO CPEP
30.0000 mg | ORAL_CAPSULE | Freq: Every morning | ORAL | 1 refills | Status: DC
  Filled 2023-09-02: qty 30, 30d supply, fill #0

## 2023-09-02 NOTE — Progress Notes (Signed)
 Psychiatric Initial Adult Assessment   Virtual Visit via Video Note  I connected with Alexandra [name] on 09/02/23 at  9:00 AM EST by a video enabled telemedicine application and verified that I am speaking with the correct person using two identifiers.  Location: Patient: Home Provider: Home Office   I discussed the limitations of evaluation and management by telemedicine and the availability of in person appointments. The patient expressed understanding and agreed to proceed.   Patient Identification: Alexandra [name] MRN:  [id number] Date of Evaluation:  09/02/2023 Referral Source: Primary care Chief Complaint:   Chief Complaint  Patient presents with   Establish Care   Visit Diagnosis:    ICD-10-CM   1. Schizoaffective disorder, bipolar type (HCC)  F25.0 risperiDONE  (RISPERDAL ) 1 MG tablet    DULoxetine  (CYMBALTA ) 30 MG capsule    2. GAD (generalized anxiety disorder)  F41.1 risperiDONE  (RISPERDAL ) 1 MG tablet    traZODone  (DESYREL ) 100 MG tablet    DULoxetine  (CYMBALTA ) 30 MG capsule    3. Primary insomnia  F51.01 risperiDONE  (RISPERDAL ) 1 MG tablet    traZODone  (DESYREL ) 100 MG tablet      History of Present Illness: Patient is 63 year old African-American, married female who is referred from primary care for the management of psychiatric symptoms.  Patient is on disability since 2015.  She has hypothyroidism, diabetes, chronic pain, arthritis, numbness, tingling and hypertension.  She was seeing [facility] for her psychiatric illness and management but due to change in the insurance she is no longer seeing a provider at [facility].  She is given the diagnosis of schizoaffective disorder bipolar type, anxiety.  She is taking risperidone  1 mg twice a day, trazodone  200 mg at bedtime and Cymbalta  30 mg daily.  She reported taking this medication for more than 10 years and they are working very well.  She used to get anxious, nervous, paranoid, hallucinations but since taking  the medication regularly the symptoms are much better.  She lives with her husband and also her 78 year old granddaughter stays with them.  Patient has 5 kids and 11 grandkids.  All of her kids and grandkids live nearby.  She is not seeing any therapist currently but like to establish care with a therapist.  She denies any crying spells or any feeling of hopelessness or worthlessness.  She denies any agitation, mania, anger or violence.  She denies drinking or using any illegal substances.  Her husband is very supportive.  She has chronic knee pain and taking narcotics from [facility].  She is able to drive and able to do the daily routine.  Patient reported history of suicidal attempt in 2015 when she took overdose on medication after she was feeling very sad depressed having financial stress and not working.  In the past she had tried multiple medication but do not remember the details.  As per EMR she took Klonopin , Celexa , Lexapro , mirtazapine , Invega  and Seroquel ..  Associated Signs/Symptoms: Depression Symptoms:  fatigue, loss of energy/fatigue, (Hypo) Manic Symptoms:  Distractibility, Anxiety Symptoms:   no current symptoms  Psychotic Symptoms:   none at this time PTSD Symptoms: NA  Past Psychiatric History: History of 1 suicidal attempt in 2015 on Invega  and Seroquel .  Previous follow-up at [facility].  As per EMR took Klonopin , Celexa , Lexapro , mirtazapine , Invega  and Seroquel .  No history of drugs and alcohol.  History of psychosis, paranoia and severe depression.  No history of abuse.  Previous Psychotropic Medications: Yes   Substance Abuse History in  the last 12 months:  No.  Consequences of Substance Abuse: NA  Past Medical History:  Past Medical History:  Diagnosis Date   Allergy    Anemia    past hx   Blood transfusion without reported diagnosis    Diabetes mellitus (HCC)    Fatty liver    GERD (gastroesophageal reflux disease)    prn med.   Hiatal hernia  07/29/1996   Hyperlipidemia    Hyperplastic colon polyp    Hypertension    new dx. - will start med. 06/28/2011   Hypothyroidism    Neuromuscular disorder (HCC)    hemiplegia , neuropathy   Rotator cuff tear, right    Stroke [facility])    TIA 2016    Past Surgical History:  Procedure Laterality Date   ABDOMINAL HYSTERECTOMY  06/25/2000   left salpingo-oophorectomy   BACK SURGERY  07/30/1995   discetomy, lumbar spinal fusion   CARDIAC CATHETERIZATION  04/01/2006   CESAREAN SECTION  07/29/1989   CHOLECYSTECTOMY  07/30/1987   COLONOSCOPY     EXPLORATORY LAPAROTOMY  05/08/2000   right salpingo-oophorectomy   LAMINOTOMY / EXCISION DISK POSTERIOR CERVICAL SPINE  08/10/2009   C7-T1   MENISCUS REPAIR Right 04/2021   Knee   POLYPECTOMY     [facility] 2012   ROUX-EN-Y GASTRIC BYPASS  05/19/2007   SHOULDER ARTHROSCOPY  01/02/2006; 05/31/2004   left 2007; right 2005   SHOULDER ARTHROSCOPY  07/02/2011   Procedure: ARTHROSCOPY SHOULDER;  Surgeon: Lamar LULLA Leonor Mickey., MD;  Location: [facility];  Service: Orthopedics;  Laterality: Right;  repair supraspinatus, Debride glenohumeral joint, labrum   SHOULDER ARTHROSCOPY DISTAL CLAVICLE EXCISION AND OPEN ROTATOR CUFF REPAIR  07/26/2010   right   THYROID  SURGERY  early 2000s   goiter   TOTAL 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/2021   Procedure: TOTAL KNEE ARTHROPLASTY;  Surgeon: Beverley Evalene BIRCH, MD;  Location: [facility];  Service: Orthopedics;  Laterality: Right;   TUBAL LIGATION  1993    Family Psychiatric History: Denies any family psychiatric illness.  Family History:  Family History  Problem Relation Age of Onset   Diabetes Mother    Hyperlipidemia Mother    Hypertension Mother    Thyroid  disease Mother    Rheum arthritis Mother    COPD Mother    Lung cancer Father        smoker   Prostate cancer Paternal Grandfather    Coronary artery disease Other    Colon cancer Neg Hx    Colon polyps Neg Hx    Esophageal cancer Neg Hx     Rectal cancer Neg Hx    Stomach cancer Neg Hx    Pancreatic cancer Neg Hx    Sleep apnea Neg Hx     Social History:   Social History   Socioeconomic History   Marital status: Married    Spouse name: Not on file   Number of children: 5   Years of education: Not on file   Highest education level: Bachelor's degree (e.g., BA, AB, BS)  Occupational History   Occupation: unemployed  Tobacco Use   Smoking status: Former    Current packs/day: 0.00    Average packs/day: 1 pack/day for 10.0 years (10.0 ttl pk-yrs)    Types: Cigarettes    Start date: 07/29/1974    Quit date: 07/29/1984    Years since quitting: 39.1   Smokeless tobacco: Never  Vaping Use   Vaping status: Never Used  Substance and Sexual Activity  Alcohol use: Yes    Comment: occassionally   Drug use: No   Sexual activity: Yes  Other Topics Concern   Not on file  Social History Narrative   Married for 32 years    Six children- all live around here. Two daughters live with her   10 grand children    On Disability for mental issues   No pets   Likes to go to movies and out to dinner. She likes to do puzzle books. Likes to read      She eats a healthy diet   She does not exercise    Lives at home with husband and granddaughter   Caffeine: 4 cups/day   Social Drivers of Corporate Investment Banker Strain: Low Risk  (05/21/2023)   Overall Financial Resource Strain (CARDIA)    Difficulty of Paying Living Expenses: Not hard at all  Food Insecurity: No Food Insecurity (05/21/2023)   Hunger Vital Sign    Worried About Running Out of Food in the Last Year: Never true    Ran Out of Food in the Last Year: Never true  Transportation Needs: No Transportation Needs (05/21/2023)   PRAPARE - Administrator, Civil Service (Medical): No    Lack of Transportation (Non-Medical): No  Physical Activity: Unknown (05/21/2023)   Exercise Vital Sign    Days of Exercise per Week: Patient declined    Minutes of  Exercise per Session: 30 min  Recent Concern: Physical Activity - Insufficiently Active (03/14/2023)   Exercise Vital Sign    Days of Exercise per Week: 3 days    Minutes of Exercise per Session: 30 min  Stress: No Stress Concern Present (05/21/2023)   [employer] of Occupational Health - Occupational Stress Questionnaire    Feeling of Stress : Not at all  Social Connections: Socially Integrated (05/21/2023)   Social Connection and Isolation Panel [NHANES]    Frequency of Communication with Friends and Family: More than three times a week    Frequency of Social Gatherings with Friends and Family: More than three times a week    Attends Religious Services: More than 4 times per year    Active Member of [employer] or Organizations: Yes    Attends Engineer, Structural: More than 4 times per year    Marital Status: Married    Additional Social History: Patient born and raised in [place] .  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  Patient married to her husband for more than 30 years.  She has 5 kids and 11 grandkids who live close by.  Allergies:   Allergies  Allergen Reactions   Shrimp (Diagnostic) Anaphylaxis    Eating shrimp causes swelling of tongue and rash   Ace Inhibitors Cough    Metabolic Disorder Labs: Lab Results  Component Value Date   HGBA1C 7.3 (A) 08/19/2023   MPG 151 10/31/2021   MPG 177 (H) 01/16/2013   No results found for: PROLACTIN Lab Results  Component Value Date   CHOL 125 11/05/2022   TRIG 89.0 11/05/2022   HDL 35.60 (L) 11/05/2022   CHOLHDL 4 11/05/2022   VLDL 17.8 11/05/2022   LDLCALC 72 11/05/2022   LDLCALC 73 10/31/2021   Lab Results  Component Value Date   TSH 2.56 11/05/2022    Therapeutic Level Labs: No results found for: LITHIUM No results found for: CBMZ No results found  for: VALPROATE  Current Medications: Current Outpatient  Medications  Medication Sig Dispense Refill   acetaminophen  (TYLENOL ) 500 MG tablet Take 2 tablets (1,000 mg total) by mouth every 6 (six) hours as needed for mild pain or moderate pain. 60 tablet 0   albuterol  (VENTOLIN  HFA) 108 (90 Base) MCG/ACT inhaler TAKE 2 PUFFS BY MOUTH EVERY 6 HOURS AS NEEDED FOR WHEEZE OR SHORTNESS OF BREATH 18 each 1   B Complex Vitamins (B COMPLEX PO) Take 1 tablet by mouth daily.     B-D UF III MINI PEN NEEDLES 31G X 5 MM MISC USE WITH INSULIN  PEN AS DIRECTED 100 each 3   BIOTIN PO Take 1 tablet by mouth daily.     bisoprolol -hydrochlorothiazide  (ZIAC ) 10-6.25 MG tablet Take 1 tablet by mouth daily. 100 tablet 2   Blood Glucose Monitoring Suppl (ONETOUCH VERIO FLEX SYSTEM) w/Device KIT use to check blood glucose 4 times daily or as needed 1 kit 0   Cholecalciferol (VITAMIN D3 PO) Take 1 tablet by mouth daily.     Continuous Glucose Sensor (FREESTYLE LIBRE 3 SENSOR) MISC Place 1 sensor on the skin every 14 days. Use to check glucose continuously 6 each 3   DULoxetine  (CYMBALTA ) 30 MG capsule Take 30 mg by mouth every morning.     ferrous sulfate  325 (65 FE) MG EC tablet Take 325 mg by mouth daily with breakfast.     fluticasone  (FLONASE ) 50 MCG/ACT nasal spray USE 2 SPRAYS IN EACH NOSTRIL DAILY (Patient taking differently: 2 sprays daily as needed for allergies or rhinitis.) 48 g 3   gabapentin  (NEURONTIN ) 400 MG capsule Take 2 capsules (800 mg total) by mouth 3 (three) times daily. 600 capsule 2   glucose blood (ONETOUCH VERIO) test strip USE TO CHECK BLOOD SUGAR 4 TIMES DAILY OR AS NEEDED 400 strip 2   HYDROcodone -acetaminophen  (NORCO/VICODIN) 5-325 MG tablet Take 1 tablet by mouth every 8 (eight) hours as needed for moderate pain.     insulin  glargine (LANTUS  SOLOSTAR) 100 UNIT/ML Solostar Pen Inject 30 Units into the skin daily. 15 mL 6   Lancet Devices MISC Use to check blood sugar TID 100 each 2   levothyroxine  (SYNTHROID ) 112 MCG tablet Take 1 tablet (112 mcg  total) by mouth daily. 100 tablet 2   meloxicam  (MOBIC ) 7.5 MG tablet Take 1 tablet (7.5 mg total) by mouth daily. 90 tablet 1   metFORMIN  (GLUCOPHAGE ) 1000 MG tablet Take 1 tablet (1,000 mg total) by mouth daily with breakfast. 100 tablet 2   ondansetron  (ZOFRAN ) 4 MG tablet Take 1 tablet (4 mg total) by mouth every 8 (eight) hours as needed for nausea or vomiting. 20 tablet 2   ondansetron  (ZOFRAN -ODT) 4 MG disintegrating tablet Take 1 tablet (4 mg total) by mouth 2 (two) times daily as needed for nausea or vomiting. 10 tablet 0   ONE TOUCH LANCETS MISC Test blood sugars once daily Dx: E11.9 100 each 3   oxybutynin  (DITROPAN -XL) 5 MG 24 hr tablet Take 1 tablet (5 mg total) by mouth at bedtime. 100 tablet 2   pantoprazole  (PROTONIX ) 40 MG tablet TAKE 1 TABLET BY MOUTH DAILY 100 tablet 2   potassium chloride  SA (KLOR-CON  M) 20 MEQ tablet Take 1 tablet (20 mEq total) by mouth daily. 90 tablet 0   risperiDONE  (RISPERDAL ) 1 MG tablet Take 1 mg by mouth 2 (two) times daily.     simvastatin  (ZOCOR ) 20 MG tablet Take 1 tablet (20 mg total) by mouth at  bedtime. 100 tablet 2   tirzepatide  (MOUNJARO ) 12.5 MG/0.5ML Pen Inject 12.5 mg into the skin once a week. 6 mL 0   traZODone  (DESYREL ) 100 MG tablet Take 200 mg by mouth at bedtime.     VITAMIN E PO Take 1 capsule by mouth daily.     No current facility-administered medications for this visit.    Musculoskeletal: Strength & Muscle Tone: decreased Gait & Station: normal Patient leans: N/A  Psychiatric Specialty Exam: Review of Systems  Musculoskeletal:  Positive for arthralgias.       Knee pain  Neurological:  Positive for numbness.    There were no vitals taken for this visit.There is no height or weight on file to calculate BMI.  General Appearance: Casual  Eye Contact:  Fair  Speech:  Slow  Volume:  Decreased  Mood:  Euthymic  Affect:  Appropriate  Thought Process:  Goal Directed  Orientation:  Full (Time, Place, and Person)  Thought  Content:  WDL  Suicidal Thoughts:  No  Homicidal Thoughts:  No  Memory:  Immediate;   Fair Recent;   Fair Remote;   Fair  Judgement:  Intact  Insight:  Present  Psychomotor Activity:  Decreased  Concentration:  Concentration: Fair and Attention Span: Fair  Recall:  [employer] of Knowledge:Fair  Language: Fair  Akathisia:  No  Handed:  Right  AIMS (if indicated):  not done  Assets:  Communication Skills Desire for Improvement Housing Resilience Social Support Talents/Skills Transportation  ADL's:  Intact  Cognition: WNL  Sleep:  Good   Screenings: AUDIT    Flowsheet Row [facility]  Alcohol Use Disorder Identification Test Final Score (AUDIT) 0      GAD-7    Flowsheet Row [facility]  Total GAD-7 Score 1      PHQ2-9    Flowsheet Row Video Visit [facility] 09/02/2023 [facility] Clinical Support from 03/14/2023 in [facility] [place] Office Visit from 05/03/2022 in [facility] [place] Clinical Support from 03/08/2022 in [facility] [place] Office Visit from 08/02/2021 in [facility] [place]  PHQ-2 Total Score 0 0 0 0 0  PHQ-9 Total Score -- -- 0 -- --      Flowsheet Row Video Visit from 09/02/2023 [facility] ED to Hosp-Admission (Discharged) from 06/03/2022 in [place] [facility] UR[facility]Y Admission (Discharged) from 11/13/2021 in [place] [facility] ORTHOP[facility]ICS  C-SSRS RISK CATEGORY No Risk No Risk No Risk       Assessment and Plan: Patient is a 63 year old African-American female with multiple health issues and history of schizoaffective disorder, anxiety and insomnia.  She is stable on current psychotropic medication.  I reviewed  blood work results, last hemoglobin A1c 7.3.  She is on narcotic pain medication for her chronic knee pain and back pain.  She is seeing pain management at [facility].  Reviewed current medication.  She is stable on risperidone  1 mg twice a day, Cymbalta  30 mg daily and trazodone  200 mg at bedtime.  In the past she had sleep study and diagnosed with apnea but she had lost weight and she does not require CPAP anymore.  Discussed to consider therapy as patient used to see therapist in the past but has not seen them in 3 years.  Patient like to restart therapy to help with coping skills.  We will refer for therapy.  Recommended to call us  back if she is any question or any concern.  Follow-up in 4 weeks.  Collaboration of Care: Other provider involved in patient's care AEB notes are available in epic to review  Patient/Guardian was advised Release of Information must be obtained prior to any record release in order to collaborate their care with an outside provider. Patient/Guardian was advised if they have not already done so to contact the registration department to sign all necessary forms in order for us  to release information regarding their care.   Consent: Patient/Guardian gives verbal consent for treatment and assignment of benefits for services provided during this visit. Patient/Guardian expressed understanding and agreed to proceed.     Follow Up Instructions:    I discussed the assessment and treatment plan with the patient. The patient was provided an opportunity to ask questions and all were answered. The patient agreed with the plan and demonstrated an understanding of the instructions.   The patient was advised to call back or seek an in-person evaluation if the symptoms worsen or if the condition fails to improve as anticipated.  I provided 68 minutes of non-face-to-face time during this encounter.   Leni ONEIDA Client, MD 2/4/20259:14 AM

## 2023-09-04 ENCOUNTER — Telehealth: Payer: Self-pay | Admitting: Pharmacist

## 2023-09-04 ENCOUNTER — Other Ambulatory Visit (HOSPITAL_COMMUNITY): Payer: Self-pay

## 2023-09-04 NOTE — Telephone Encounter (Signed)
 Pharmacy Patient Advocate Encounter  Received notification from [facility] [employer] that Prior Authorization for Mounjaro  12.5MG /0.5ML auto-injectors has been APPROVED from 09/04/2023 to 09/03/2024   PA #/Case ID/Reference #: [id number]

## 2023-09-05 NOTE — Telephone Encounter (Signed)
 Patient notified of update  and verbalized understanding.

## 2023-09-08 ENCOUNTER — Telehealth: Payer: Self-pay

## 2023-09-08 ENCOUNTER — Other Ambulatory Visit (HOSPITAL_COMMUNITY): Payer: Self-pay

## 2023-09-08 NOTE — Telephone Encounter (Signed)
 Copied from CRM [id number]. Topic: General - Other >> Sep 08, 2023  2:51 PM Barney Boozer wrote: Reason for CRM: Order Request >> Sep 08, 2023  2:53 PM Barney Boozer wrote: [facility] called on behalf of the patient, states that the patient's insurance is showing inactive therefor they will NOT be able to fulfill the request.

## 2023-09-09 NOTE — Telephone Encounter (Signed)
I called them a while ago to relay pt is using another pharmacy. This was already taking care of.

## 2023-09-16 DIAGNOSIS — G894 Chronic pain syndrome: Secondary | ICD-10-CM | POA: Diagnosis not present

## 2023-09-16 DIAGNOSIS — M25569 Pain in unspecified knee: Secondary | ICD-10-CM | POA: Diagnosis not present

## 2023-09-16 DIAGNOSIS — F32A Depression, unspecified: Secondary | ICD-10-CM | POA: Diagnosis not present

## 2023-09-16 DIAGNOSIS — M25562 Pain in left knee: Secondary | ICD-10-CM | POA: Diagnosis not present

## 2023-09-16 DIAGNOSIS — Z6830 Body mass index (BMI) 30.0-30.9, adult: Secondary | ICD-10-CM | POA: Diagnosis not present

## 2023-09-16 DIAGNOSIS — Z96659 Presence of unspecified artificial knee joint: Secondary | ICD-10-CM | POA: Diagnosis not present

## 2023-09-16 DIAGNOSIS — E1140 Type 2 diabetes mellitus with diabetic neuropathy, unspecified: Secondary | ICD-10-CM | POA: Diagnosis not present

## 2023-09-17 ENCOUNTER — Ambulatory Visit: Payer: HMO | Admitting: Adult Health

## 2023-09-17 ENCOUNTER — Other Ambulatory Visit (HOSPITAL_COMMUNITY): Payer: Self-pay

## 2023-09-19 ENCOUNTER — Ambulatory Visit: Payer: HMO | Admitting: Adult Health

## 2023-09-19 VITALS — BP 130/60 | HR 87 | Temp 97.5°F | Ht 61.5 in | Wt 170.0 lb

## 2023-09-19 DIAGNOSIS — B49 Unspecified mycosis: Secondary | ICD-10-CM

## 2023-09-19 MED ORDER — TERBINAFINE HCL 1 % EX CREA: 1.0000 | TOPICAL_CREAM | Freq: Two times a day (BID) | CUTANEOUS | 0 refills | Status: AC

## 2023-09-19 NOTE — Progress Notes (Signed)
Subjective:    Patient ID: Alexandra [name], female    DOB: January 22, 1961, 63 y.o.   MRN: [id number]  Mouth Lesions  Associated symptoms include mouth sores.   63 year old female who  has a past medical history of Allergy, Anemia, Blood transfusion without reported diagnosis, Diabetes mellitus (HCC), Fatty liver, GERD (gastroesophageal reflux disease), Hiatal hernia (07/29/1996), Hyperlipidemia, Hyperplastic colon polyp, Hypertension, Hypothyroidism, Neuromuscular disorder (HCC), Rotator cuff tear, right, and Stroke (HCC).  She presents to the office today for an acute issue. She reports that over the last 8 days she has noticed a " spot on the left side of her face. This spot is lighter in color and itches. She feels as though the spot is getting larger. She has not been using any OTC creams.    Review of Systems  HENT:  Positive for mouth sores.    See HPI   Past Medical History:  Diagnosis Date   Allergy    Anemia    past hx   Blood transfusion without reported diagnosis    Diabetes mellitus (HCC)    Fatty liver    GERD (gastroesophageal reflux disease)    prn med.   Hiatal hernia 07/29/1996   Hyperlipidemia    Hyperplastic colon polyp    Hypertension    new dx. - will start med. 06/28/2011   Hypothyroidism    Neuromuscular disorder (HCC)    hemiplegia , neuropathy   Rotator cuff tear, right    Stroke [facility])    TIA 2016    Social History   Socioeconomic History   Marital status: Married    Spouse name: Not on file   Number of children: 5   Years of education: Not on file   Highest education level: Bachelor's degree (e.g., BA, AB, BS)  Occupational History   Occupation: unemployed  Tobacco Use   Smoking status: Former    Current packs/day: 0.00    Average packs/day: 1 pack/day for 10.0 years (10.0 ttl pk-yrs)    Types: Cigarettes    Start date: 07/29/1974    Quit date: 07/29/1984    Years since quitting: 39.1   Smokeless tobacco: Never  Vaping Use   Vaping  status: Never Used  Substance and Sexual Activity   Alcohol use: Yes    Comment: occassionally   Drug use: No   Sexual activity: Yes  Other Topics Concern   Not on file  Social History Narrative   Married for 32 years    Six children- all live around here. Two daughters live with her   10 grand children    On Disability for mental issues   No pets   Likes to go to movies and out to dinner. She likes to do puzzle books. Likes to read      She eats a healthy diet   She does not exercise    Lives at home with husband and granddaughter   Caffeine: 4 cups/day   Social Drivers of Corporate investment banker Strain: Low Risk  (09/18/2023)   Overall Financial Resource Strain (CARDIA)    Difficulty of Paying Living Expenses: Not hard at all  Food Insecurity: No Food Insecurity (09/18/2023)   Hunger Vital Sign    Worried About Running Out of Food in the Last Year: Never true    Ran Out of Food in the Last Year: Never true  Transportation Needs: No Transportation Needs (09/18/2023)   PRAPARE - Transportation    Lack  of Transportation (Medical): No    Lack of Transportation (Non-Medical): No  Physical Activity: Unknown (09/18/2023)   Exercise Vital Sign    Days of Exercise per Week: 1 day    Minutes of Exercise per Session: Patient declined  Stress: Patient Declined (09/18/2023)   [employer] of Occupational Health - Occupational Stress Questionnaire    Feeling of Stress : Patient declined  Social Connections: Socially Integrated (09/18/2023)   Social Connection and Isolation Panel [NHANES]    Frequency of Communication with Friends and Family: More than three times a week    Frequency of Social Gatherings with Friends and Family: Twice a week    Attends Religious Services: More than 4 times per year    Active Member of Clubs or Organizations: Yes    Attends Banker Meetings: More than 4 times per year    Marital Status: Married  Catering manager Violence: Not At  Risk (03/14/2023)   Humiliation, Afraid, Rape, and Kick questionnaire    Fear of Current or Ex-Partner: No    Emotionally Abused: No    Physically Abused: No    Sexually Abused: No    Past Surgical History:  Procedure Laterality Date   ABDOMINAL HYSTERECTOMY  06/25/2000   left salpingo-oophorectomy   BACK SURGERY  07/30/1995   discetomy, lumbar spinal fusion   CARDIAC CATHETERIZATION  04/01/2006   CESAREAN SECTION  07/29/1989   CHOLECYSTECTOMY  07/30/1987   COLONOSCOPY     EXPLORATORY LAPAROTOMY  05/08/2000   right salpingo-oophorectomy   LAMINOTOMY / EXCISION DISK POSTERIOR CERVICAL SPINE  08/10/2009   C7-T1   MENISCUS REPAIR Right 04/2021   Knee   POLYPECTOMY     [facility] 2012   ROUX-EN-Y GASTRIC BYPASS  05/19/2007   SHOULDER ARTHROSCOPY  01/02/2006; 05/31/2004   left 2007; right 2005   SHOULDER ARTHROSCOPY  07/02/2011   Procedure: ARTHROSCOPY SHOULDER;  Surgeon: Wyn Forster., MD;  Location: [facility];  Service: Orthopedics;  Laterality: Right;  repair supraspinatus, Debride glenohumeral joint, labrum   SHOULDER ARTHROSCOPY DISTAL CLAVICLE EXCISION AND OPEN ROTATOR CUFF REPAIR  07/26/2010   right   THYROID SURGERY  early 2000s   goiter   TOTAL 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/2021   Procedure: TOTAL KNEE ARTHROPLASTY;  Surgeon: Sheral Apley, MD;  Location: [facility];  Service: Orthopedics;  Laterality: Right;   TUBAL LIGATION  1993    Family History  Problem Relation Age of Onset   Diabetes Mother    Hyperlipidemia Mother    Hypertension Mother    Thyroid disease Mother    Rheum arthritis Mother    COPD Mother    Lung cancer Father        smoker   Prostate cancer Paternal Grandfather    Coronary artery disease Other    Colon cancer Neg Hx    Colon polyps Neg Hx    Esophageal cancer Neg Hx    Rectal cancer Neg Hx    Stomach cancer Neg Hx    Pancreatic cancer Neg Hx    Sleep apnea Neg Hx     Allergies  Allergen Reactions   Shrimp (Diagnostic)  Anaphylaxis    Eating shrimp causes swelling of tongue and rash   Ace Inhibitors Cough    Current Outpatient Medications on File Prior to Visit  Medication Sig Dispense Refill   acetaminophen (TYLENOL) 500 MG tablet Take 2 tablets (1,000 mg total) by mouth every 6 (six) hours as needed for mild pain  or moderate pain. 60 tablet 0   albuterol (VENTOLIN HFA) 108 (90 Base) MCG/ACT inhaler TAKE 2 PUFFS BY MOUTH EVERY 6 HOURS AS NEEDED FOR WHEEZE OR SHORTNESS OF BREATH 18 each 1   B Complex Vitamins (B COMPLEX PO) Take 1 tablet by mouth daily.     B-D UF III MINI PEN NEEDLES 31G X 5 MM MISC USE WITH INSULIN PEN AS DIRECTED 100 each 3   BIOTIN PO Take 1 tablet by mouth daily.     bisoprolol-hydrochlorothiazide (ZIAC) 10-6.25 MG tablet Take 1 tablet by mouth daily. 100 tablet 2   Blood Glucose Monitoring Suppl (ONETOUCH VERIO FLEX SYSTEM) w/Device KIT use to check blood glucose 4 times daily or as needed 1 kit 0   Cholecalciferol (VITAMIN D3 PO) Take 1 tablet by mouth daily.     Continuous Glucose Sensor (FREESTYLE LIBRE 3 SENSOR) MISC Place 1 sensor on the skin every 14 days. Use to check glucose continuously 6 each 3   DULoxetine (CYMBALTA) 30 MG capsule Take 1 capsule (30 mg total) by mouth every morning. 30 capsule 1   ferrous sulfate 325 (65 FE) MG EC tablet Take 325 mg by mouth daily with breakfast.     fluticasone (FLONASE) 50 MCG/ACT nasal spray USE 2 SPRAYS IN EACH NOSTRIL DAILY (Patient taking differently: 2 sprays daily as needed for allergies or rhinitis.) 48 g 3   gabapentin (NEURONTIN) 400 MG capsule Take 2 capsules (800 mg total) by mouth 3 (three) times daily. 600 capsule 2   glucose blood (ONETOUCH VERIO) test strip USE TO CHECK BLOOD SUGAR 4 TIMES DAILY OR AS NEEDED 400 strip 2   HYDROcodone-acetaminophen (NORCO/VICODIN) 5-325 MG tablet Take 1 tablet by mouth every 8 (eight) hours as needed for moderate pain.     insulin glargine (LANTUS SOLOSTAR) 100 UNIT/ML Solostar Pen Inject 30  Units into the skin daily. 15 mL 6   Lancet Devices MISC Use to check blood sugar TID 100 each 2   levothyroxine (SYNTHROID) 112 MCG tablet Take 1 tablet (112 mcg total) by mouth daily. 100 tablet 2   meloxicam (MOBIC) 7.5 MG tablet Take 1 tablet (7.5 mg total) by mouth daily. 90 tablet 1   metFORMIN (GLUCOPHAGE) 1000 MG tablet Take 1 tablet (1,000 mg total) by mouth daily with breakfast. 100 tablet 2   ondansetron (ZOFRAN) 4 MG tablet Take 1 tablet (4 mg total) by mouth every 8 (eight) hours as needed for nausea or vomiting. 20 tablet 2   ONE TOUCH LANCETS MISC Test blood sugars once daily Dx: E11.9 100 each 3   oxybutynin (DITROPAN-XL) 5 MG 24 hr tablet Take 1 tablet (5 mg total) by mouth at bedtime. 100 tablet 2   pantoprazole (PROTONIX) 40 MG tablet TAKE 1 TABLET BY MOUTH DAILY 100 tablet 2   potassium chloride SA (KLOR-CON M) 20 MEQ tablet Take 1 tablet (20 mEq total) by mouth daily. 90 tablet 0   risperiDONE (RISPERDAL) 1 MG tablet Take 1 tablet (1 mg total) by mouth 2 (two) times daily. 60 tablet 1   simvastatin (ZOCOR) 20 MG tablet Take 1 tablet (20 mg total) by mouth at bedtime. 100 tablet 2   tirzepatide (MOUNJARO) 12.5 MG/0.5ML Pen Inject 12.5 mg into the skin once a week. 6 mL 0   traZODone (DESYREL) 100 MG tablet Take 2 tablets (200 mg total) by mouth at bedtime. 60 tablet 1   VITAMIN E PO Take 1 capsule by mouth daily.     No  current facility-administered medications on file prior to visit.    BP 130/60   Pulse 87   Temp (!) 97.5 F (36.4 C) (Oral)   Ht 5' 1.5" (1.562 m)   Wt 170 lb (77.1 kg)   SpO2 98%   BMI 31.60 kg/m       Objective:   Physical Exam Vitals and nursing note reviewed.  Constitutional:      Appearance: Normal appearance.  Skin:    General: Skin is warm.     Comments: She has a 2 cm x 2 cm hypopigmentation area on the left side of her face near her mouth. There is a slightly raised boarder.   Neurological:     General: No focal deficit present.      Mental Status: She is alert and oriented to person, place, and time.  Psychiatric:        Mood and Affect: Mood normal.        Behavior: Behavior normal.        Thought Content: Thought content normal.        Judgment: Judgment normal.       Assessment & Plan:  1. Fungal infection (Primary) Possibly fungal infection. She did not have any other hypopigmentation areas on her body. I do not think this is vitiligo at this point. Will trial her on Lamisil topical cream x 2 weeks. Follow up if not resolving  - terbinafine (LAMISIL) 1 % cream; Apply 1 Application topically 2 (two) times daily.  Dispense: 30 g; Refill: 0  Shirline Frees, NP

## 2023-09-26 ENCOUNTER — Ambulatory Visit: Payer: Medicare Other | Admitting: Gastroenterology

## 2023-09-30 ENCOUNTER — Encounter: Payer: Self-pay | Admitting: Adult Health

## 2023-09-30 DIAGNOSIS — F319 Bipolar disorder, unspecified: Secondary | ICD-10-CM

## 2023-09-30 DIAGNOSIS — F250 Schizoaffective disorder, bipolar type: Secondary | ICD-10-CM

## 2023-10-01 ENCOUNTER — Telehealth (HOSPITAL_COMMUNITY): Payer: Self-pay | Admitting: Psychiatry

## 2023-10-01 ENCOUNTER — Encounter (HOSPITAL_COMMUNITY): Payer: Self-pay | Admitting: Psychiatry

## 2023-10-01 ENCOUNTER — Other Ambulatory Visit (HOSPITAL_COMMUNITY): Payer: Self-pay

## 2023-10-01 ENCOUNTER — Other Ambulatory Visit: Payer: Self-pay

## 2023-10-01 VITALS — Wt 170.0 lb

## 2023-10-01 DIAGNOSIS — F5101 Primary insomnia: Secondary | ICD-10-CM

## 2023-10-01 DIAGNOSIS — F411 Generalized anxiety disorder: Secondary | ICD-10-CM | POA: Diagnosis not present

## 2023-10-01 DIAGNOSIS — F250 Schizoaffective disorder, bipolar type: Secondary | ICD-10-CM

## 2023-10-01 MED ORDER — RISPERIDONE 1 MG PO TABS
1.0000 mg | ORAL_TABLET | Freq: Two times a day (BID) | ORAL | 1 refills | Status: DC
  Filled 2023-10-01 – 2023-11-10 (×5): qty 60, 30d supply, fill #0

## 2023-10-01 MED ORDER — TRAZODONE HCL 100 MG PO TABS
200.0000 mg | ORAL_TABLET | Freq: Every day | ORAL | 1 refills | Status: DC
  Filled 2023-10-01 – 2023-11-10 (×5): qty 60, 30d supply, fill #0

## 2023-10-01 MED ORDER — DULOXETINE HCL 30 MG PO CPEP
30.0000 mg | ORAL_CAPSULE | Freq: Two times a day (BID) | ORAL | 1 refills | Status: DC
  Filled 2023-10-01 – 2023-11-06 (×3): qty 60, 30d supply, fill #0

## 2023-10-01 NOTE — Progress Notes (Signed)
 BH MD/PA/NP OP Progress Note  Virtual Visit via Video Note  I connected with Alexandra [name] on 10/01/23 at 11:20 AM EST by a video enabled telemedicine application and verified that I am speaking with the correct person using two identifiers.  Location: Patient: Home Provider: Home Office   I discussed the limitations of evaluation and management by telemedicine and the availability of in person appointments. The patient expressed understanding and agreed to proceed.   10/01/2023 11:52 AM Alexandra [name]  MRN:  [id number]  Chief Complaint:  Chief Complaint  Patient presents with   Anxiety   Depression   Follow-up   HPI: Patient is 63 year old African-American, married female who is referred from primary care and seen first time 4 weeks ago.  She is on disability since 2015.  She has multiple health issues including hypothyroidism, diabetes, chronic pain, arthritis and hypertension.  She was seen psychiatrist at [facility] however due to change in her insurance she is requiring a new provider.  She is taking risperidone 1 mg twice a day, trazodone 200 mg at bedtime and Cymbalta 30 mg daily.  She reported her symptoms mostly stable but sometimes she feels willing dysphoric, fatigue, lack of energy and anxiety because of chronic health issues.  She has knee pain.  She recently had a visit with pain management and she was told to consider going up on Cymbalta.  So far she is not have any tremors shakes or any EPS.  She denies any suicidal thoughts, hallucination or any paranoia.  She sleeps good.  She lives with her husband and her 87 year old granddaughter stays with him.  Patient has 5 kids and 11 grandkids.  She denies drinking or using any illegal substances.  She is on moderate pain medication from [facility].  She is on gabapentin, hydrocodone and muscle relaxant.  Despite taking all these medication she is still struggle with chronic fatigue, pain and has no motivation  to do things.  She like trazodone 200 mg which helps her sleep.  Her last hemoglobin A1c 7.3 which is slightly better than 7.6 before.  Her diet is okay.  Her weight is stable.  We had referred to see a therapist and patient is in the process of seeing a therapist at [facility] to get a counselor.  She is hoping to get appointment very soon.  Visit Diagnosis:    ICD-10-CM   1. Schizoaffective disorder, bipolar type (HCC)  F25.0 DULoxetine (CYMBALTA) 30 MG capsule    risperiDONE (RISPERDAL) 1 MG tablet    2. GAD (generalized anxiety disorder)  F41.1 traZODone (DESYREL) 100 MG tablet    DULoxetine (CYMBALTA) 30 MG capsule    risperiDONE (RISPERDAL) 1 MG tablet    3. Primary insomnia  F51.01 traZODone (DESYREL) 100 MG tablet    risperiDONE (RISPERDAL) 1 MG tablet      Past Psychiatric History: Reviewed History of depression, anxiety, schizoaffective disorder.  History of overdose on Invega and Seroquel in 2015.  Follow-up at [facility].  As per EMR took Klonopin, Celexa, Lexapro, mirtazapine, Seroquel and Invega.  No history of drugs or alcohol.  No history of abuse.  Past Medical History:  Past Medical History:  Diagnosis Date   Allergy    Anemia    past hx   Blood transfusion without reported diagnosis    Diabetes mellitus (HCC)    Fatty liver    GERD (gastroesophageal reflux disease)    prn med.   Hiatal hernia 07/29/1996  Hyperlipidemia    Hyperplastic colon polyp    Hypertension    new dx. - will start med. 06/28/2011   Hypothyroidism    Neuromuscular disorder (HCC)    hemiplegia , neuropathy   Rotator cuff tear, right    Stroke [facility])    TIA 2016    Past Surgical History:  Procedure Laterality Date   ABDOMINAL HYSTERECTOMY  06/25/2000   left salpingo-oophorectomy   BACK SURGERY  07/30/1995   discetomy, lumbar spinal fusion   CARDIAC CATHETERIZATION  04/01/2006   CESAREAN SECTION  07/29/1989   CHOLECYSTECTOMY  07/30/1987   COLONOSCOPY      EXPLORATORY LAPAROTOMY  05/08/2000   right salpingo-oophorectomy   LAMINOTOMY / EXCISION DISK POSTERIOR CERVICAL SPINE  08/10/2009   C7-T1   MENISCUS REPAIR Right 04/2021   Knee   POLYPECTOMY     [facility] 2012   ROUX-EN-Y GASTRIC BYPASS  05/19/2007   SHOULDER ARTHROSCOPY  01/02/2006; 05/31/2004   left 2007; right 2005   SHOULDER ARTHROSCOPY  07/02/2011   Procedure: ARTHROSCOPY SHOULDER;  Surgeon: Wyn Forster., MD;  Location: [facility];  Service: Orthopedics;  Laterality: Right;  repair supraspinatus, Debride glenohumeral joint, labrum   SHOULDER ARTHROSCOPY DISTAL CLAVICLE EXCISION AND OPEN ROTATOR CUFF REPAIR  07/26/2010   right   THYROID SURGERY  early 2000s   goiter   TOTAL 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/2021   Procedure: TOTAL KNEE ARTHROPLASTY;  Surgeon: Sheral Apley, MD;  Location: [facility];  Service: Orthopedics;  Laterality: Right;   TUBAL LIGATION  1993    Family History:  Family History  Problem Relation Age of Onset   Diabetes Mother    Hyperlipidemia Mother    Hypertension Mother    Thyroid disease Mother    Rheum arthritis Mother    COPD Mother    Lung cancer Father        smoker   Prostate cancer Paternal Grandfather    Coronary artery disease Other    Colon cancer Neg Hx    Colon polyps Neg Hx    Esophageal cancer Neg Hx    Rectal cancer Neg Hx    Stomach cancer Neg Hx    Pancreatic cancer Neg Hx    Sleep apnea Neg Hx     Social History:  Social History   Socioeconomic History   Marital status: Married    Spouse name: Not on file   Number of children: 5   Years of education: Not on file   Highest education level: Bachelor's degree (e.g., BA, AB, BS)  Occupational History   Occupation: unemployed  Tobacco Use   Smoking status: Former    Current packs/day: 0.00    Average packs/day: 1 pack/day for 10.0 years (10.0 ttl pk-yrs)    Types: Cigarettes    Start date: 07/29/1974    Quit date: 07/29/1984    Years since quitting: 39.2    Smokeless tobacco: Never  Vaping Use   Vaping status: Never Used  Substance and Sexual Activity   Alcohol use: Yes    Comment: occassionally   Drug use: No   Sexual activity: Yes  Other Topics Concern   Not on file  Social History Narrative   Married for 32 years    Six children- all live around here. Two daughters live with her   10 grand children    On Disability for mental issues   No pets   Likes to go to movies and out to dinner. She  likes to do puzzle books. Likes to read      She eats a healthy diet   She does not exercise    Lives at home with husband and granddaughter   Caffeine: 4 cups/day   Social Drivers of Corporate investment banker Strain: Low Risk  (09/18/2023)   Overall Financial Resource Strain (CARDIA)    Difficulty of Paying Living Expenses: Not hard at all  Food Insecurity: No Food Insecurity (09/18/2023)   Hunger Vital Sign    Worried About Running Out of Food in the Last Year: Never true    Ran Out of Food in the Last Year: Never true  Transportation Needs: No Transportation Needs (09/18/2023)   PRAPARE - Administrator, Civil Service (Medical): No    Lack of Transportation (Non-Medical): No  Physical Activity: Unknown (09/18/2023)   Exercise Vital Sign    Days of Exercise per Week: 1 day    Minutes of Exercise per Session: Patient declined  Stress: Patient Declined (09/18/2023)   [employer] of Occupational Health - Occupational Stress Questionnaire    Feeling of Stress : Patient declined  Social Connections: Socially Integrated (09/18/2023)   Social Connection and Isolation Panel [NHANES]    Frequency of Communication with Friends and Family: More than three times a week    Frequency of Social Gatherings with Friends and Family: Twice a week    Attends Religious Services: More than 4 times per year    Active Member of [employer] or Organizations: Yes    Attends Engineer, structural: More than 4 times per year    Marital Status:  Married    Allergies:  Allergies  Allergen Reactions   Shrimp (Diagnostic) Anaphylaxis    Eating shrimp causes swelling of tongue and rash   Ace Inhibitors Cough    Metabolic Disorder Labs: Lab Results  Component Value Date   HGBA1C 7.3 (A) 08/19/2023   MPG 151 10/31/2021   MPG 177 (H) 01/16/2013   No results found for: "PROLACTIN" Lab Results  Component Value Date   CHOL 125 11/05/2022   TRIG 89.0 11/05/2022   HDL 35.60 (L) 11/05/2022   CHOLHDL 4 11/05/2022   VLDL 17.8 11/05/2022   LDLCALC 72 11/05/2022   LDLCALC 73 10/31/2021   Lab Results  Component Value Date   TSH 2.56 11/05/2022   TSH 4.35 10/31/2021    Therapeutic Level Labs: No results found for: "LITHIUM" No results found for: "VALPROATE" No results found for: "CBMZ"  Current Medications: Current Outpatient Medications  Medication Sig Dispense Refill   acetaminophen (TYLENOL) 500 MG tablet Take 2 tablets (1,000 mg total) by mouth every 6 (six) hours as needed for mild pain or moderate pain. 60 tablet 0   albuterol (VENTOLIN HFA) 108 (90 Base) MCG/ACT inhaler TAKE 2 PUFFS BY MOUTH EVERY 6 HOURS AS NEEDED FOR WHEEZE OR SHORTNESS OF BREATH 18 each 1   B Complex Vitamins (B COMPLEX PO) Take 1 tablet by mouth daily.     B-D UF III MINI PEN NEEDLES 31G X 5 MM MISC USE WITH INSULIN PEN AS DIRECTED 100 each 3   BIOTIN PO Take 1 tablet by mouth daily.     bisoprolol-hydrochlorothiazide (ZIAC) 10-6.25 MG tablet Take 1 tablet by mouth daily. 100 tablet 2   Blood Glucose Monitoring Suppl (ONETOUCH VERIO FLEX SYSTEM) w/Device KIT use to check blood glucose 4 times daily or as needed 1 kit 0   Cholecalciferol (VITAMIN D3 PO) Take 1  tablet by mouth daily.     Continuous Glucose Sensor (FREESTYLE LIBRE 3 SENSOR) MISC Place 1 sensor on the skin every 14 days. Use to check glucose continuously 6 each 3   DULoxetine (CYMBALTA) 30 MG capsule Take 1 capsule (30 mg total) by mouth every morning. 30 capsule 1   ferrous sulfate  325 (65 FE) MG EC tablet Take 325 mg by mouth daily with breakfast.     fluticasone (FLONASE) 50 MCG/ACT nasal spray USE 2 SPRAYS IN EACH NOSTRIL DAILY (Patient taking differently: 2 sprays daily as needed for allergies or rhinitis.) 48 g 3   gabapentin (NEURONTIN) 400 MG capsule Take 2 capsules (800 mg total) by mouth 3 (three) times daily. 600 capsule 2   glucose blood (ONETOUCH VERIO) test strip USE TO CHECK BLOOD SUGAR 4 TIMES DAILY OR AS NEEDED 400 strip 2   HYDROcodone-acetaminophen (NORCO/VICODIN) 5-325 MG tablet Take 1 tablet by mouth every 8 (eight) hours as needed for moderate pain.     insulin glargine (LANTUS SOLOSTAR) 100 UNIT/ML Solostar Pen Inject 30 Units into the skin daily. 15 mL 6   Lancet Devices MISC Use to check blood sugar TID 100 each 2   levothyroxine (SYNTHROID) 112 MCG tablet Take 1 tablet (112 mcg total) by mouth daily. 100 tablet 2   meloxicam (MOBIC) 7.5 MG tablet Take 1 tablet (7.5 mg total) by mouth daily. 90 tablet 1   metFORMIN (GLUCOPHAGE) 1000 MG tablet Take 1 tablet (1,000 mg total) by mouth daily with breakfast. 100 tablet 2   ondansetron (ZOFRAN) 4 MG tablet Take 1 tablet (4 mg total) by mouth every 8 (eight) hours as needed for nausea or vomiting. 20 tablet 2   ONE TOUCH LANCETS MISC Test blood sugars once daily Dx: E11.9 100 each 3   oxybutynin (DITROPAN-XL) 5 MG 24 hr tablet Take 1 tablet (5 mg total) by mouth at bedtime. 100 tablet 2   pantoprazole (PROTONIX) 40 MG tablet TAKE 1 TABLET BY MOUTH DAILY 100 tablet 2   potassium chloride SA (KLOR-CON M) 20 MEQ tablet Take 1 tablet (20 mEq total) by mouth daily. 90 tablet 0   risperiDONE (RISPERDAL) 1 MG tablet Take 1 tablet (1 mg total) by mouth 2 (two) times daily. 60 tablet 1   simvastatin (ZOCOR) 20 MG tablet Take 1 tablet (20 mg total) by mouth at bedtime. 100 tablet 2   terbinafine (LAMISIL) 1 % cream Apply 1 Application topically 2 (two) times daily. 30 g 0   tirzepatide (MOUNJARO) 12.5 MG/0.5ML Pen  Inject 12.5 mg into the skin once a week. 6 mL 0   traZODone (DESYREL) 100 MG tablet Take 2 tablets (200 mg total) by mouth at bedtime. 60 tablet 1   VITAMIN E PO Take 1 capsule by mouth daily.     No current facility-administered medications for this visit.     Musculoskeletal: Strength & Muscle Tone: within normal limits Gait & Station: normal Patient leans: N/A  Psychiatric Specialty Exam: Review of Systems  Constitutional:  Positive for fatigue.  Musculoskeletal:        Knee pain  Neurological:  Positive for numbness.    Weight 170 lb (77.1 kg).There is no height or weight on file to calculate BMI.  General Appearance: Casual  Eye Contact:  Fair  Speech:  Slow  Volume:  Decreased  Mood:  Dysphoric  Affect:  Congruent  Thought Process:  Goal Directed  Orientation:  Full (Time, Place, and Person)  Thought Content: Rumination  Suicidal Thoughts:  No  Homicidal Thoughts:  No  Memory:  Immediate;   Fair Recent;   Fair Remote;   Fair  Judgement:  Intact  Insight:  Present  Psychomotor Activity:  Decreased  Concentration:  Concentration: Fair and Attention Span: Fair  Recall:  [employer] of Knowledge: Fair  Language: Good  Akathisia:  No  Handed:  Right  AIMS (if indicated): not done  Assets:  Communication Skills Desire for Improvement Housing Social Support Transportation  ADL's:  Intact  Cognition: WNL  Sleep:  Good   Screenings: AUDIT    Flowsheet Row [facility]  Alcohol Use Disorder Identification Test Final Score (AUDIT) 0      GAD-7    Flowsheet Row [facility]  Total GAD-7 Score 1      PHQ2-9    Flowsheet Row Video Visit [facility] 09/02/2023 [facility] Clinical Support from 03/14/2023 in [facility] [place] Office Visit from 05/03/2022 in  [facility] [place] Clinical Support from 03/08/2022 in [facility] [place] Office Visit from 08/02/2021 in [facility] [place]  PHQ-2 Total Score 0 0 0 0 0  PHQ-9 Total Score -- -- 0 -- --      Flowsheet Row Video Visit [facility] 09/02/2023 [facility] ED to Hosp-Admission (Discharged) from 06/03/2022 in [place] [facility] UROLOGY Admission (Discharged) from 11/13/2021 in [place] [facility] ORTHOPEDICS  C-SSRS RISK CATEGORY No Risk No Risk No Risk        Assessment and Plan: Patient is 63 year old African-American female with multiple health issues and schizoaffective disorder, primary insomnia, anxiety.  I review medication list.  We have referred therapy patient is waiting to see a therapist at [facility].  Discussed optimizing Cymbalta to help her chronic depression and may be beneficial to help pain.  She agreed to give a trial.  Will increase Cymbalta 60 mg daily and keep the risperidone 1 mg twice a day and trazodone 200 mg at bedtime.  Encouraged to call us back if she is any question or any concern.  Discussed medication side effects and benefits.  Follow-up in 2 months.  Collaboration of Care: Collaboration of Care: Other provider involved in patient's care AEB notes are available in epic to review  Patient/Guardian was advised Release of Information must be obtained prior to any record release in order to collaborate their care with an outside provider. Patient/Guardian was advised if they have not already done so to contact the registration department to sign all necessary forms in order for [place] to release information regarding their care.   Consent: Patient/Guardian gives verbal consent for treatment and assignment of benefits for services provided during this visit. Patient/Guardian expressed understanding and agreed to proceed.     Follow Up Instructions:    I discussed the assessment and treatment plan with the patient. The patient was provided an opportunity to ask questions and all were answered. The patient agreed with the plan and demonstrated an understanding of the instructions.   The patient was advised to call back or seek an in-person evaluation if the symptoms worsen or if the condition fails to improve as anticipated.  I provided 25 minutes of non-face-to-face time during this encounter.   Cleotis Nipper, MD 10/01/2023, 11:52 AM

## 2023-10-02 NOTE — Telephone Encounter (Signed)
 Can you help with this please.

## 2023-10-03 DIAGNOSIS — F205 Residual schizophrenia: Secondary | ICD-10-CM | POA: Diagnosis not present

## 2023-10-03 DIAGNOSIS — F331 Major depressive disorder, recurrent, moderate: Secondary | ICD-10-CM | POA: Diagnosis not present

## 2023-10-07 NOTE — Telephone Encounter (Signed)
 Can you create a new referral? The one that was available shows that she has already been seen by [facility].

## 2023-10-08 NOTE — Telephone Encounter (Signed)
 Placed a referral. However, unable to placed the referral to the right office. I advised of who pt requested in the scheduling notes.

## 2023-10-14 ENCOUNTER — Other Ambulatory Visit (HOSPITAL_COMMUNITY): Payer: Self-pay

## 2023-10-14 DIAGNOSIS — E1140 Type 2 diabetes mellitus with diabetic neuropathy, unspecified: Secondary | ICD-10-CM | POA: Diagnosis not present

## 2023-10-14 DIAGNOSIS — M25562 Pain in left knee: Secondary | ICD-10-CM | POA: Diagnosis not present

## 2023-10-14 DIAGNOSIS — Z6830 Body mass index (BMI) 30.0-30.9, adult: Secondary | ICD-10-CM | POA: Diagnosis not present

## 2023-10-14 DIAGNOSIS — F32A Depression, unspecified: Secondary | ICD-10-CM | POA: Diagnosis not present

## 2023-10-14 DIAGNOSIS — M25569 Pain in unspecified knee: Secondary | ICD-10-CM | POA: Diagnosis not present

## 2023-10-14 DIAGNOSIS — Z96659 Presence of unspecified artificial knee joint: Secondary | ICD-10-CM | POA: Diagnosis not present

## 2023-10-14 DIAGNOSIS — G894 Chronic pain syndrome: Secondary | ICD-10-CM | POA: Diagnosis not present

## 2023-10-22 DIAGNOSIS — F205 Residual schizophrenia: Secondary | ICD-10-CM | POA: Diagnosis not present

## 2023-10-22 DIAGNOSIS — F331 Major depressive disorder, recurrent, moderate: Secondary | ICD-10-CM | POA: Diagnosis not present

## 2023-10-27 ENCOUNTER — Other Ambulatory Visit: Payer: Self-pay | Admitting: Adult Health

## 2023-10-27 DIAGNOSIS — Z7985 Long-term (current) use of injectable non-insulin antidiabetic drugs: Secondary | ICD-10-CM

## 2023-10-28 ENCOUNTER — Other Ambulatory Visit: Payer: Self-pay

## 2023-10-28 ENCOUNTER — Other Ambulatory Visit (HOSPITAL_COMMUNITY): Payer: Self-pay

## 2023-10-28 MED ORDER — MOUNJARO 12.5 MG/0.5ML ~~LOC~~ SOAJ
12.5000 mg | SUBCUTANEOUS | 0 refills | Status: DC
  Filled 2023-10-28: qty 2, 28d supply, fill #0

## 2023-10-29 ENCOUNTER — Other Ambulatory Visit (HOSPITAL_COMMUNITY): Payer: Self-pay

## 2023-10-31 DIAGNOSIS — F205 Residual schizophrenia: Secondary | ICD-10-CM | POA: Diagnosis not present

## 2023-10-31 DIAGNOSIS — F331 Major depressive disorder, recurrent, moderate: Secondary | ICD-10-CM | POA: Diagnosis not present

## 2023-11-05 DIAGNOSIS — F331 Major depressive disorder, recurrent, moderate: Secondary | ICD-10-CM | POA: Diagnosis not present

## 2023-11-05 DIAGNOSIS — F205 Residual schizophrenia: Secondary | ICD-10-CM | POA: Diagnosis not present

## 2023-11-06 ENCOUNTER — Other Ambulatory Visit (HOSPITAL_COMMUNITY): Payer: Self-pay

## 2023-11-06 ENCOUNTER — Encounter: Payer: Self-pay | Admitting: Adult Health

## 2023-11-06 ENCOUNTER — Other Ambulatory Visit: Payer: Self-pay | Admitting: Adult Health

## 2023-11-06 ENCOUNTER — Other Ambulatory Visit: Payer: Self-pay

## 2023-11-06 ENCOUNTER — Ambulatory Visit: Payer: HMO | Admitting: Adult Health

## 2023-11-06 VITALS — BP 120/70 | HR 85 | Temp 98.1°F | Ht 62.5 in | Wt 164.0 lb

## 2023-11-06 DIAGNOSIS — E1142 Type 2 diabetes mellitus with diabetic polyneuropathy: Secondary | ICD-10-CM | POA: Diagnosis not present

## 2023-11-06 DIAGNOSIS — Z23 Encounter for immunization: Secondary | ICD-10-CM | POA: Diagnosis not present

## 2023-11-06 DIAGNOSIS — K219 Gastro-esophageal reflux disease without esophagitis: Secondary | ICD-10-CM

## 2023-11-06 DIAGNOSIS — E119 Type 2 diabetes mellitus without complications: Secondary | ICD-10-CM

## 2023-11-06 DIAGNOSIS — Z7984 Long term (current) use of oral hypoglycemic drugs: Secondary | ICD-10-CM | POA: Diagnosis not present

## 2023-11-06 DIAGNOSIS — I10 Essential (primary) hypertension: Secondary | ICD-10-CM | POA: Diagnosis not present

## 2023-11-06 DIAGNOSIS — Z7985 Long-term (current) use of injectable non-insulin antidiabetic drugs: Secondary | ICD-10-CM

## 2023-11-06 DIAGNOSIS — E876 Hypokalemia: Secondary | ICD-10-CM

## 2023-11-06 DIAGNOSIS — N3281 Overactive bladder: Secondary | ICD-10-CM

## 2023-11-06 DIAGNOSIS — G894 Chronic pain syndrome: Secondary | ICD-10-CM

## 2023-11-06 DIAGNOSIS — Z794 Long term (current) use of insulin: Secondary | ICD-10-CM

## 2023-11-06 DIAGNOSIS — Z0000 Encounter for general adult medical examination without abnormal findings: Secondary | ICD-10-CM | POA: Diagnosis not present

## 2023-11-06 DIAGNOSIS — E039 Hypothyroidism, unspecified: Secondary | ICD-10-CM

## 2023-11-06 LAB — CBC WITH DIFFERENTIAL/PLATELET
Basophils Absolute: 0 10*3/uL (ref 0.0–0.1)
Basophils Relative: 0.3 % (ref 0.0–3.0)
Eosinophils Absolute: 0.2 10*3/uL (ref 0.0–0.7)
Eosinophils Relative: 2.3 % (ref 0.0–5.0)
HCT: 41 % (ref 36.0–46.0)
Hemoglobin: 13.8 g/dL (ref 12.0–15.0)
Lymphocytes Relative: 29.6 % (ref 12.0–46.0)
Lymphs Abs: 2.4 10*3/uL (ref 0.7–4.0)
MCHC: 33.6 g/dL (ref 30.0–36.0)
MCV: 94.9 fl (ref 78.0–100.0)
Monocytes Absolute: 0.7 10*3/uL (ref 0.1–1.0)
Monocytes Relative: 8 % (ref 3.0–12.0)
Neutro Abs: 4.9 10*3/uL (ref 1.4–7.7)
Neutrophils Relative %: 59.8 % (ref 43.0–77.0)
Platelets: 386 10*3/uL (ref 150.0–400.0)
RBC: 4.33 Mil/uL (ref 3.87–5.11)
RDW: 13.4 % (ref 11.5–15.5)
WBC: 8.3 10*3/uL (ref 4.0–10.5)

## 2023-11-06 LAB — LIPID PANEL
Cholesterol: 122 mg/dL (ref 0–200)
HDL: 35.7 mg/dL — ABNORMAL LOW (ref >39.00)
LDL Cholesterol: 68 mg/dL (ref 0–99)
NonHDL: 85.9
Total CHOL/HDL Ratio: 3
Triglycerides: 91 mg/dL (ref 0.0–149.0)
VLDL: 18.2 mg/dL (ref 0.0–40.0)

## 2023-11-06 LAB — COMPREHENSIVE METABOLIC PANEL WITH GFR
ALT: 33 U/L (ref 0–35)
AST: 24 U/L (ref 0–37)
Albumin: 4.2 g/dL (ref 3.5–5.2)
Alkaline Phosphatase: 72 U/L (ref 39–117)
BUN: 5 mg/dL — ABNORMAL LOW (ref 6–23)
CO2: 31 meq/L (ref 19–32)
Calcium: 9.2 mg/dL (ref 8.4–10.5)
Chloride: 103 meq/L (ref 96–112)
Creatinine, Ser: 0.8 mg/dL (ref 0.40–1.20)
GFR: 78.3 mL/min (ref >60.00)
Glucose, Bld: 161 mg/dL — ABNORMAL HIGH (ref 70–99)
Potassium: 4 meq/L (ref 3.5–5.1)
Sodium: 143 meq/L (ref 135–145)
Total Bilirubin: 0.9 mg/dL (ref 0.2–1.2)
Total Protein: 7 g/dL (ref 6.0–8.3)

## 2023-11-06 LAB — MICROALBUMIN / CREATININE URINE RATIO
Creatinine,U: 117.1 mg/dL
Microalb Creat Ratio: 6 mg/g (ref 0.0–30.0)
Microalb, Ur: 0.7 mg/dL (ref 0.0–1.9)

## 2023-11-06 LAB — HEMOGLOBIN A1C: Hgb A1c MFr Bld: 7.2 % — ABNORMAL HIGH (ref 4.6–6.5)

## 2023-11-06 LAB — TSH: TSH: 0.28 u[IU]/mL — ABNORMAL LOW (ref 0.35–5.50)

## 2023-11-06 MED ORDER — LEVOTHYROXINE SODIUM 100 MCG PO TABS
100.0000 ug | ORAL_TABLET | Freq: Every day | ORAL | 0 refills | Status: DC
  Filled 2023-11-06: qty 30, 30d supply, fill #0

## 2023-11-06 MED ORDER — POTASSIUM CHLORIDE CRYS ER 20 MEQ PO TBCR
20.0000 meq | EXTENDED_RELEASE_TABLET | Freq: Every day | ORAL | 0 refills | Status: DC
  Filled 2023-11-06: qty 90, 90d supply, fill #0

## 2023-11-06 MED ORDER — TIRZEPATIDE 15 MG/0.5ML ~~LOC~~ SOAJ
15.0000 mg | SUBCUTANEOUS | 1 refills | Status: DC
Start: 2023-11-06 — End: 2024-04-18
  Filled 2023-11-06 – 2023-11-07 (×2): qty 6, 84d supply, fill #0
  Filled 2023-12-26 – 2024-01-15 (×2): qty 6, 84d supply, fill #1

## 2023-11-06 NOTE — Progress Notes (Signed)
 Subjective:    Patient ID: Alexandra [name], female    DOB: Feb 21, 1961, 64 y.o.   MRN: [id number]  HPI Patient presents for yearly preventative medicine examination. She is a pleasant 63 year old female who  has a past medical history of Allergy, Anemia, Blood transfusion without reported diagnosis, Diabetes mellitus (HCC), Fatty liver, GERD (gastroesophageal reflux disease), Hiatal hernia (07/29/1996), Hyperlipidemia, Hyperplastic colon polyp, Hypertension, Hypothyroidism, Neuromuscular disorder (HCC), Rotator cuff tear, right, and Stroke (HCC).  Diabetes mellitus type II-managed with Mounjaro 12.5 mg weekly, metformin 1000 mg daily, and Lantus 30 units daily.  She uses the [employer] sensor to manage her blood sugars. She continues to drink multiple soft drinks a day, reports that she has blood sugar readings up to " unreadable".  Lab Results  Component Value Date   HGBA1C 7.3 (A) 08/19/2023   HGBA1C 7.6 (A) 05/22/2023   HGBA1C 9.2 (A) 02/19/2023    CONTINUOUS GLUCOSE MONITORING RECORD INTERPRETATION                 Dates of Recording: Jan 11 - April 10th 2025   Sensor description: freestyle libre    Results statistics:      Average and SD 166  Time in range 69%  % Time Above 180 22 %  % Time above 250 9%  % Time Below target 0%     Hypertension - managed with Ziac 10-6.25  mg daily.  BP Readings from Last 3 Encounters:  11/06/23 120/70  09/19/23 130/60  08/19/23 110/70   Hypothyroidism-currently prescribed Synthroid 112 mcg. Lab Results  Component Value Date   TSH 2.56 11/05/2022   Hyperlipidemia -managed with simvastatin 20 mg nightly.  She denies myalgia or fatigue Lab Results  Component Value Date   CHOL 125 11/05/2022   HDL 35.60 (L) 11/05/2022   LDLCALC 72 11/05/2022   TRIG 89.0 11/05/2022   CHOLHDL 4 11/05/2022   Diabetic neuropathy-prescribed gabapentin 800 mg 3 times daily.  OAB-well-controlled with Ditropan XL 5 mg   Chronic Pain -right knee.  She  is followed by [facility] management at [facility].  Currently prescribed hydrocodone 5 mg 3 times daily   GERD - Takes Protonix 40 mg daily. Feels well controlled.   Alternating bowel habits - she reports ongoing diarrhea and constipation, this has been going on since before she started [facility]. She often has to take Mira lax to have a bowel movement in which causes diarrhea. At times she is nauseated. Has a follow up with GI in May 2025. CT abdomen in 02/2023 showed no acute pathology.    All immunizations and health maintenance protocols were reviewed with the patient and needed orders were placed.  Appropriate screening laboratory values were ordered for the patient including screening of hyperlipidemia, renal function and hepatic function. If indicated by BPH, a PSA was ordered.  Medication reconciliation,  past medical history, social history, problem list and allergies were reviewed in detail with the patient  Goals were established with regard to weight loss, exercise, and  diet in compliance with medications Wt Readings from Last 3 Encounters:  11/06/23 164 lb (74.4 kg)  09/19/23 170 lb (77.1 kg)  08/19/23 164 lb (74.4 kg)   She is up to date on routine colon cancer screening and mammograms.   Review of Systems  Constitutional: Negative.   HENT: Negative.    Eyes: Negative.   Respiratory: Negative.    Cardiovascular: Negative.   Gastrointestinal: Negative.   Endocrine: Negative.  Genitourinary: Negative.   Musculoskeletal:  Positive for arthralgias.  Skin: Negative.   Allergic/Immunologic: Negative.   Neurological: Negative.   Hematological: Negative.   Psychiatric/Behavioral: Negative.     Past Medical History:  Diagnosis Date   Allergy    Anemia    past hx   Blood transfusion without reported diagnosis    Diabetes mellitus (HCC)    Fatty liver    GERD (gastroesophageal reflux disease)    prn med.   Hiatal hernia 07/29/1996   Hyperlipidemia     Hyperplastic colon polyp    Hypertension    new dx. - will start med. 06/28/2011   Hypothyroidism    Neuromuscular disorder (HCC)    hemiplegia , neuropathy   Rotator cuff tear, right    Stroke [facility])    TIA 2016    Social History   Socioeconomic History   Marital status: Married    Spouse name: Not on file   Number of children: 5   Years of education: Not on file   Highest education level: Bachelor's degree (e.g., BA, AB, BS)  Occupational History   Occupation: unemployed  Tobacco Use   Smoking status: Former    Current packs/day: 0.00    Average packs/day: 1 pack/day for 10.0 years (10.0 ttl pk-yrs)    Types: Cigarettes    Start date: 07/29/1974    Quit date: 07/29/1984    Years since quitting: 39.2   Smokeless tobacco: Never  Vaping Use   Vaping status: Never Used  Substance and Sexual Activity   Alcohol use: Yes    Comment: occassionally   Drug use: No   Sexual activity: Yes  Other Topics Concern   Not on file  Social History Narrative   Married for 32 years    Six children- all live around here. Two daughters live with her   10 grand children    On Disability for mental issues   No pets   Likes to go to movies and out to dinner. She likes to do puzzle books. Likes to read      She eats a healthy diet   She does not exercise    Lives at home with husband and granddaughter   Caffeine: 4 cups/day   Social Drivers of Corporate investment banker Strain: Low Risk  (09/18/2023)   Overall Financial Resource Strain (CARDIA)    Difficulty of Paying Living Expenses: Not hard at all  Food Insecurity: No Food Insecurity (09/18/2023)   Hunger Vital Sign    Worried About Running Out of Food in the Last Year: Never true    Ran Out of Food in the Last Year: Never true  Transportation Needs: No Transportation Needs (09/18/2023)   PRAPARE - Administrator, Civil Service (Medical): No    Lack of Transportation (Non-Medical): No  Physical Activity: Unknown  (09/18/2023)   Exercise Vital Sign    Days of Exercise per Week: 1 day    Minutes of Exercise per Session: Patient declined  Stress: Patient Declined (09/18/2023)   [employer] of Occupational Health - Occupational Stress Questionnaire    Feeling of Stress : Patient declined  Social Connections: Socially Integrated (09/18/2023)   Social Connection and Isolation Panel [NHANES]    Frequency of Communication with Friends and Family: More than three times a week    Frequency of Social Gatherings with Friends and Family: Twice a week    Attends Religious Services: More than 4 times per year  Active Member of Clubs or Organizations: Yes    Attends Banker Meetings: More than 4 times per year    Marital Status: Married  Catering manager Violence: Not At Risk (03/14/2023)   Humiliation, Afraid, Rape, and Kick questionnaire    Fear of Current or Ex-Partner: No    Emotionally Abused: No    Physically Abused: No    Sexually Abused: No    Past Surgical History:  Procedure Laterality Date   ABDOMINAL HYSTERECTOMY  06/25/2000   left salpingo-oophorectomy   BACK SURGERY  07/30/1995   discetomy, lumbar spinal fusion   CARDIAC CATHETERIZATION  04/01/2006   CESAREAN SECTION  07/29/1989   CHOLECYSTECTOMY  07/30/1987   COLONOSCOPY     EXPLORATORY LAPAROTOMY  05/08/2000   right salpingo-oophorectomy   LAMINOTOMY / EXCISION DISK POSTERIOR CERVICAL SPINE  08/10/2009   C7-T1   MENISCUS REPAIR Right 04/2021   Knee   POLYPECTOMY     [facility] 2012   ROUX-EN-Y GASTRIC BYPASS  05/19/2007   SHOULDER ARTHROSCOPY  01/02/2006; 05/31/2004   left 2007; right 2005   SHOULDER ARTHROSCOPY  07/02/2011   Procedure: ARTHROSCOPY SHOULDER;  Surgeon: Wyn Forster., MD;  Location: [facility];  Service: Orthopedics;  Laterality: Right;  repair supraspinatus, Debride glenohumeral joint, labrum   SHOULDER ARTHROSCOPY DISTAL CLAVICLE EXCISION AND OPEN ROTATOR CUFF REPAIR  07/26/2010    right   THYROID SURGERY  early 2000s   goiter   TOTAL 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/2021   Procedure: TOTAL KNEE ARTHROPLASTY;  Surgeon: Sheral Apley, MD;  Location: [facility];  Service: Orthopedics;  Laterality: Right;   TUBAL LIGATION  1993    Family History  Problem Relation Age of Onset   Diabetes Mother    Hyperlipidemia Mother    Hypertension Mother    Thyroid disease Mother    Rheum arthritis Mother    COPD Mother    Lung cancer Father        smoker   Prostate cancer Paternal Grandfather    Coronary artery disease Other    Colon cancer Neg Hx    Colon polyps Neg Hx    Esophageal cancer Neg Hx    Rectal cancer Neg Hx    Stomach cancer Neg Hx    Pancreatic cancer Neg Hx    Sleep apnea Neg Hx     Allergies  Allergen Reactions   Shrimp (Diagnostic) Anaphylaxis    Eating shrimp causes swelling of tongue and rash   Ace Inhibitors Cough    Current Outpatient Medications on File Prior to Visit  Medication Sig Dispense Refill   acetaminophen (TYLENOL) 500 MG tablet Take 2 tablets (1,000 mg total) by mouth every 6 (six) hours as needed for mild pain or moderate pain. 60 tablet 0   albuterol (VENTOLIN HFA) 108 (90 Base) MCG/ACT inhaler TAKE 2 PUFFS BY MOUTH EVERY 6 HOURS AS NEEDED FOR WHEEZE OR SHORTNESS OF BREATH 18 each 1   B Complex Vitamins (B COMPLEX PO) Take 1 tablet by mouth daily.     B-D UF III MINI PEN NEEDLES 31G X 5 MM MISC USE WITH INSULIN PEN AS DIRECTED 100 each 3   BIOTIN PO Take 1 tablet by mouth daily.     bisoprolol-hydrochlorothiazide (ZIAC) 10-6.25 MG tablet Take 1 tablet by mouth daily. 100 tablet 2   Blood Glucose Monitoring Suppl (ONETOUCH VERIO FLEX SYSTEM) w/Device KIT use to check blood glucose 4 times daily or as needed 1 kit 0  Cholecalciferol (VITAMIN D3 PO) Take 1 tablet by mouth daily.     Continuous Glucose Sensor (FREESTYLE LIBRE 3 SENSOR) MISC Place 1 sensor on the skin every 14 days. Use to check glucose continuously 6 each 3    DULoxetine (CYMBALTA) 30 MG capsule Take 1 capsule (30 mg total) by mouth 2 (two) times daily. 60 capsule 1   ferrous sulfate 325 (65 FE) MG EC tablet Take 325 mg by mouth daily with breakfast.     fluticasone (FLONASE) 50 MCG/ACT nasal spray USE 2 SPRAYS IN EACH NOSTRIL DAILY (Patient taking differently: 2 sprays daily as needed for allergies or rhinitis.) 48 g 3   gabapentin (NEURONTIN) 400 MG capsule Take 2 capsules (800 mg total) by mouth 3 (three) times daily. 600 capsule 2   glucose blood (ONETOUCH VERIO) test strip USE TO CHECK BLOOD SUGAR 4 TIMES DAILY OR AS NEEDED 400 strip 2   HYDROcodone-acetaminophen (NORCO/VICODIN) 5-325 MG tablet Take 1 tablet by mouth every 8 (eight) hours as needed for moderate pain.     insulin glargine (LANTUS SOLOSTAR) 100 UNIT/ML Solostar Pen Inject 30 Units into the skin daily. 15 mL 6   Lancet Devices MISC Use to check blood sugar TID 100 each 2   levothyroxine (SYNTHROID) 112 MCG tablet Take 1 tablet (112 mcg total) by mouth daily. 100 tablet 2   meloxicam (MOBIC) 7.5 MG tablet Take 1 tablet (7.5 mg total) by mouth daily. 90 tablet 1   metFORMIN (GLUCOPHAGE) 1000 MG tablet Take 1 tablet (1,000 mg total) by mouth daily with breakfast. 100 tablet 2   ondansetron (ZOFRAN) 4 MG tablet Take 1 tablet (4 mg total) by mouth every 8 (eight) hours as needed for nausea or vomiting. 20 tablet 2   ONE TOUCH LANCETS MISC Test blood sugars once daily Dx: E11.9 100 each 3   oxybutynin (DITROPAN-XL) 5 MG 24 hr tablet Take 1 tablet (5 mg total) by mouth at bedtime. 100 tablet 2   pantoprazole (PROTONIX) 40 MG tablet TAKE 1 TABLET BY MOUTH DAILY 100 tablet 2   potassium chloride SA (KLOR-CON M) 20 MEQ tablet Take 1 tablet (20 mEq total) by mouth daily. 90 tablet 0   risperiDONE (RISPERDAL) 1 MG tablet Take 1 tablet (1 mg total) by mouth 2 (two) times daily. 60 tablet 1   simvastatin (ZOCOR) 20 MG tablet Take 1 tablet (20 mg total) by mouth at bedtime. 100 tablet 2   terbinafine  (LAMISIL) 1 % cream Apply 1 Application topically 2 (two) times daily. 30 g 0   tirzepatide (MOUNJARO) 12.5 MG/0.5ML Pen Inject 12.5 mg into the skin once a week. 6 mL 0   traZODone (DESYREL) 100 MG tablet Take 2 tablets (200 mg total) by mouth at bedtime. 60 tablet 1   VITAMIN E PO Take 1 capsule by mouth daily.     No current facility-administered medications on file prior to visit.    BP 120/70   Pulse 85   Temp 98.1 F (36.7 C) (Oral)   Ht 5' 2.5" (1.588 m)   Wt 164 lb (74.4 kg)   SpO2 96%   BMI 29.52 kg/m       Objective:   Physical Exam Vitals and nursing note reviewed.  Constitutional:      General: She is not in acute distress.    Appearance: Normal appearance. She is not ill-appearing.  HENT:     Head: Normocephalic and atraumatic.     Right Ear: Tympanic membrane, ear canal  and external ear normal. There is no impacted cerumen.     Left Ear: Tympanic membrane, ear canal and external ear normal. There is no impacted cerumen.     Nose: Nose normal. No congestion or rhinorrhea.     Mouth/Throat:     Mouth: Mucous membranes are moist.     Pharynx: Oropharynx is clear.  Eyes:     Extraocular Movements: Extraocular movements intact.     Conjunctiva/sclera: Conjunctivae normal.     Pupils: Pupils are equal, round, and reactive to light.  Neck:     Vascular: No carotid bruit.  Cardiovascular:     Rate and Rhythm: Normal rate and regular rhythm.     Pulses: Normal pulses.     Heart sounds: No murmur heard.    No friction rub. No gallop.  Pulmonary:     Effort: Pulmonary effort is normal.     Breath sounds: Normal breath sounds.  Abdominal:     General: Abdomen is flat. Bowel sounds are normal. There is no distension.     Palpations: Abdomen is soft. There is no mass.     Tenderness: There is abdominal tenderness. There is no guarding or rebound.     Hernia: No hernia is present.  Musculoskeletal:        General: Normal range of motion.     Cervical back: Normal  range of motion and neck supple.  Lymphadenopathy:     Cervical: No cervical adenopathy.  Skin:    General: Skin is warm and dry.     Capillary Refill: Capillary refill takes less than 2 seconds.  Neurological:     General: No focal deficit present.     Mental Status: She is alert and oriented to person, place, and time.  Psychiatric:        Mood and Affect: Mood normal.        Behavior: Behavior normal.        Thought Content: Thought content normal.        Judgment: Judgment normal.       Assessment & Plan:  1. Routine general medical examination at a health care facility (Primary) Today patient counseled on age appropriate routine health concerns for screening and prevention, each reviewed and up to date or declined. Immunizations reviewed and up to date or declined. Labs ordered and reviewed. Risk factors for depression reviewed and negative. Hearing function and visual acuity are intact. ADLs screened and addressed as needed. Functional ability and level of safety reviewed and appropriate. Education, counseling and referrals performed based on assessed risks today. Patient provided with a copy of personalized plan for preventive services. - Follow up in one year  - needs to make better dietary choices.   2. Essential hypertension - well controlled. No change in medication  - CBC with Differential/Platelet; Future - Comprehensive metabolic panel with GFR; Future - Lipid panel; Future - TSH; Future  3. Diabetes mellitus treated with oral medication (HCC) - Continue Metformin  - Needs to cut out sodas - 3 month follow up  - CBC with Differential/Platelet; Future - Comprehensive metabolic panel with GFR; Future - Lipid panel; Future - TSH; Future - Hemoglobin A1c; Future - Microalbumin/Creatinine Ratio, Urine; Future  4. Current use of insulin (HCC) - Consider increase in insulin  - CBC with Differential/Platelet; Future - Comprehensive metabolic panel with GFR; Future -  Lipid panel; Future - TSH; Future - Hemoglobin A1c; Future - Microalbumin/Creatinine Ratio, Urine; Future  5. Long-term current use of  injectable noninsulin antidiabetic medication - Consider increase to [facility].  - CBC with Differential/Platelet; Future - Comprehensive metabolic panel with GFR; Future - Lipid panel; Future - TSH; Future - Hemoglobin A1c; Future - Microalbumin/Creatinine Ratio, Urine; Future  6. Diabetic polyneuropathy associated with type 2 diabetes mellitus (HCC) - Continue with Gabapentin  - CBC with Differential/Platelet; Future - Comprehensive metabolic panel with GFR; Future - Lipid panel; Future - TSH; Future  7. OAB (overactive bladder) - Continue Ditropan.  - CBC with Differential/Platelet; Future - Comprehensive metabolic panel with GFR; Future - Lipid panel; Future - TSH; Future  8. Chronic pain syndrome - Per pain management  - CBC with Differential/Platelet; Future - Comprehensive metabolic panel with GFR; Future - Lipid panel; Future - TSH; Future  9. Gastroesophageal reflux disease without esophagitis - Continue PPI  - CBC with Differential/Platelet; Future - Comprehensive metabolic panel with GFR; Future - Lipid panel; Future - TSH; Future  10. Need for pneumococcal vaccine  - Pneumococcal conjugate vaccine 20-valent (Prevnar 20)  Shirline Frees, NP

## 2023-11-06 NOTE — Patient Instructions (Signed)
 It was great seeing you today   We will follow up with you regarding your lab work   Please let me know if you need anything

## 2023-11-07 ENCOUNTER — Other Ambulatory Visit (HOSPITAL_COMMUNITY): Payer: Self-pay

## 2023-11-10 ENCOUNTER — Other Ambulatory Visit: Payer: Self-pay

## 2023-11-10 ENCOUNTER — Other Ambulatory Visit (HOSPITAL_COMMUNITY): Payer: Self-pay

## 2023-11-12 DIAGNOSIS — F32A Depression, unspecified: Secondary | ICD-10-CM | POA: Diagnosis not present

## 2023-11-12 DIAGNOSIS — Z96659 Presence of unspecified artificial knee joint: Secondary | ICD-10-CM | POA: Diagnosis not present

## 2023-11-12 DIAGNOSIS — E1140 Type 2 diabetes mellitus with diabetic neuropathy, unspecified: Secondary | ICD-10-CM | POA: Diagnosis not present

## 2023-11-12 DIAGNOSIS — Z6830 Body mass index (BMI) 30.0-30.9, adult: Secondary | ICD-10-CM | POA: Diagnosis not present

## 2023-11-12 DIAGNOSIS — M25562 Pain in left knee: Secondary | ICD-10-CM | POA: Diagnosis not present

## 2023-11-12 DIAGNOSIS — M25569 Pain in unspecified knee: Secondary | ICD-10-CM | POA: Diagnosis not present

## 2023-11-12 DIAGNOSIS — G894 Chronic pain syndrome: Secondary | ICD-10-CM | POA: Diagnosis not present

## 2023-11-12 DIAGNOSIS — F331 Major depressive disorder, recurrent, moderate: Secondary | ICD-10-CM | POA: Diagnosis not present

## 2023-11-12 DIAGNOSIS — F205 Residual schizophrenia: Secondary | ICD-10-CM | POA: Diagnosis not present

## 2023-11-14 ENCOUNTER — Other Ambulatory Visit (HOSPITAL_COMMUNITY): Payer: Self-pay

## 2023-11-19 DIAGNOSIS — F205 Residual schizophrenia: Secondary | ICD-10-CM | POA: Diagnosis not present

## 2023-11-19 DIAGNOSIS — F331 Major depressive disorder, recurrent, moderate: Secondary | ICD-10-CM | POA: Diagnosis not present

## 2023-11-26 DIAGNOSIS — F331 Major depressive disorder, recurrent, moderate: Secondary | ICD-10-CM | POA: Diagnosis not present

## 2023-11-26 DIAGNOSIS — F205 Residual schizophrenia: Secondary | ICD-10-CM | POA: Diagnosis not present

## 2023-11-30 ENCOUNTER — Other Ambulatory Visit: Payer: Self-pay | Admitting: Adult Health

## 2023-11-30 DIAGNOSIS — E039 Hypothyroidism, unspecified: Secondary | ICD-10-CM

## 2023-12-01 ENCOUNTER — Other Ambulatory Visit: Payer: Self-pay

## 2023-12-01 ENCOUNTER — Ambulatory Visit: Admitting: Gastroenterology

## 2023-12-01 ENCOUNTER — Encounter: Payer: Self-pay | Admitting: Gastroenterology

## 2023-12-01 ENCOUNTER — Other Ambulatory Visit (HOSPITAL_COMMUNITY): Payer: Self-pay

## 2023-12-01 ENCOUNTER — Other Ambulatory Visit (INDEPENDENT_AMBULATORY_CARE_PROVIDER_SITE_OTHER)

## 2023-12-01 VITALS — BP 110/70 | HR 76 | Ht 61.0 in | Wt 164.0 lb

## 2023-12-01 DIAGNOSIS — K909 Intestinal malabsorption, unspecified: Secondary | ICD-10-CM | POA: Diagnosis not present

## 2023-12-01 DIAGNOSIS — R6881 Early satiety: Secondary | ICD-10-CM | POA: Diagnosis not present

## 2023-12-01 DIAGNOSIS — R110 Nausea: Secondary | ICD-10-CM

## 2023-12-01 DIAGNOSIS — E119 Type 2 diabetes mellitus without complications: Secondary | ICD-10-CM

## 2023-12-01 DIAGNOSIS — E039 Hypothyroidism, unspecified: Secondary | ICD-10-CM

## 2023-12-01 DIAGNOSIS — R197 Diarrhea, unspecified: Secondary | ICD-10-CM

## 2023-12-01 DIAGNOSIS — R1084 Generalized abdominal pain: Secondary | ICD-10-CM

## 2023-12-01 LAB — TSH: TSH: 0.7 u[IU]/mL (ref 0.35–5.50)

## 2023-12-01 MED ORDER — LINACLOTIDE 145 MCG PO CAPS
145.0000 ug | ORAL_CAPSULE | Freq: Every day | ORAL | 2 refills | Status: DC
  Filled 2023-12-01: qty 30, 30d supply, fill #0
  Filled 2023-12-26: qty 30, 30d supply, fill #1
  Filled 2024-01-17: qty 30, 30d supply, fill #2

## 2023-12-01 NOTE — Progress Notes (Signed)
 Chief Complaint: Follow-up Primary GI MD: Dr. Leonia Raman  HPI: 64 year old female history of diabetes, fatty liver, GERD, hiatal hernia, neuromuscular disorder, gastric sleeve, stroke, presents for follow-up  Prior workup - Negative stool culture - Negative CT abdomen pelvis with contrast - H. pylori antibody IgG negative - CBC 05/22/2023 with WBC 10.7 otherwise normal. Potassium 2.7, glucose 371, otherwise normal BMP. Potassium was also 2.02 February 2023  Last seen December 2024 for infrequent bowel movements (every 8 days), watery stools, exacerbated after Mounjaro  thought to be overflow diarrhea.  KUB with moderate stool burden.  She was also having nausea and early satiety secondary to delayed gastric emptying exacerbated by Mounjaro  use.   Discussed the use of AI scribe software for clinical note transcription with the patient, who gave verbal consent to proceed.  History of Present Illness She has been experiencing alternating constipation and diarrhea for approximately six months. Initially, she had watery diarrhea and difficulty with bowel movements. She was prescribed Miralax  for constipation, which she took as directed, but it caused diarrhea, leading her to reduce its use. Despite this, she can only have a bowel movement with Miralax , resulting in diarrhea. It takes about a week to eight days of Miralax  use before she can have a bowel movement.  She is currently taking Mounjaro , which she believes may be contributing to her bowel issues. She continues to experience oily stools. A previous x-ray indicated the presence of stool.    PREVIOUS GI WORKUP   CT abdomen pelvis with contrast 03/20/2023 showed s/p cholecystectomy.  Stomach with postop changes.  No obstruction.  No bowel wall thickening.  No acute abnormality.   Colonoscopy 03/16/2021 - two to 3-4 polyps in descending colon.  Removed with a cold snare.  Resected and retrieved. - Nonbleeding external/internal hemorrhoids -  Otherwise normal - Repeat 7 years (due 02/2028)   Surgical [P], colon, descending, polyp (2) - TUBULAR ADENOMA (2 OF 2 FRAGMENTS) - NO HIGH-GRADE DYSPLASIA OR MALIGNANCY IDENTIFIED  Past Medical History:  Diagnosis Date   Allergy    Anemia    past hx   Blood transfusion without reported diagnosis    Diabetes mellitus (HCC)    Fatty liver    GERD (gastroesophageal reflux disease)    prn med.   Hiatal hernia 07/29/1996   Hyperlipidemia    Hyperplastic colon polyp    Hypertension    new dx. - will start med. 06/28/2011   Hypothyroidism    Neuromuscular disorder (HCC)    hemiplegia , neuropathy   Rotator cuff tear, right    Stroke [facility])    TIA 2016    Past Surgical History:  Procedure Laterality Date   ABDOMINAL HYSTERECTOMY  06/25/2000   left salpingo-oophorectomy   BACK SURGERY  07/30/1995   discetomy, lumbar spinal fusion   CARDIAC CATHETERIZATION  04/01/2006   CESAREAN SECTION  07/29/1989   CHOLECYSTECTOMY  07/30/1987   COLONOSCOPY     EXPLORATORY LAPAROTOMY  05/08/2000   right salpingo-oophorectomy   LAMINOTOMY / EXCISION DISK POSTERIOR CERVICAL SPINE  08/10/2009   C7-T1   MENISCUS REPAIR Right 04/2021   Knee   POLYPECTOMY     [facility] 2012   ROUX-EN-Y GASTRIC BYPASS  05/19/2007   SHOULDER ARTHROSCOPY  01/02/2006; 05/31/2004   left 2007; right 2005   SHOULDER ARTHROSCOPY  07/02/2011   Procedure: ARTHROSCOPY SHOULDER;  Surgeon: Amelie Baize., MD;  Location: [facility];  Service: Orthopedics;  Laterality: Right;  repair supraspinatus, Debride glenohumeral joint,  labrum   SHOULDER ARTHROSCOPY DISTAL CLAVICLE EXCISION AND OPEN ROTATOR CUFF REPAIR  07/26/2010   right   THYROID  SURGERY  early 2000s   goiter   TOTAL 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/2021   Procedure: TOTAL KNEE ARTHROPLASTY;  Surgeon: Saundra Curl, MD;  Location: [facility];  Service: Orthopedics;  Laterality: Right;   TUBAL LIGATION  1993    Current Outpatient Medications  Medication  Sig Dispense Refill   acetaminophen  (TYLENOL ) 500 MG tablet Take 2 tablets (1,000 mg total) by mouth every 6 (six) hours as needed for mild pain or moderate pain. (Patient taking differently: Take 1,000 mg by mouth as needed for mild pain (pain score 1-3) or moderate pain (pain score 4-6).) 60 tablet 0   albuterol  (VENTOLIN  HFA) 108 (90 Base) MCG/ACT inhaler TAKE 2 PUFFS BY MOUTH EVERY 6 HOURS AS NEEDED FOR WHEEZE OR SHORTNESS OF BREATH (Patient taking differently: as needed.) 18 each 1   B Complex Vitamins (B COMPLEX PO) Take 1 tablet by mouth daily.     B-D UF III MINI PEN NEEDLES 31G X 5 MM MISC USE WITH INSULIN  PEN AS DIRECTED 100 each 3   BIOTIN PO Take 1 tablet by mouth daily.     bisoprolol -hydrochlorothiazide  (ZIAC ) 10-6.25 MG tablet Take 1 tablet by mouth daily. 100 tablet 2   Blood Glucose Monitoring Suppl (ONETOUCH VERIO FLEX SYSTEM) w/Device KIT use to check blood glucose 4 times daily or as needed 1 kit 0   Cholecalciferol (VITAMIN D3 PO) Take 1 tablet by mouth daily.     Continuous Glucose Sensor (FREESTYLE LIBRE 3 SENSOR) MISC Place 1 sensor on the skin every 14 days. Use to check glucose continuously 6 each 3   DULoxetine  (CYMBALTA ) 30 MG capsule Take 1 capsule (30 mg total) by mouth 2 (two) times daily. 60 capsule 1   ferrous sulfate  325 (65 FE) MG EC tablet Take 325 mg by mouth daily with breakfast.     fluticasone  (FLONASE ) 50 MCG/ACT nasal spray USE 2 SPRAYS IN EACH NOSTRIL DAILY (Patient taking differently: 2 sprays daily as needed for allergies or rhinitis.) 48 g 3   gabapentin  (NEURONTIN ) 400 MG capsule Take 2 capsules (800 mg total) by mouth 3 (three) times daily. 600 capsule 2   glucose blood (ONETOUCH VERIO) test strip USE TO CHECK BLOOD SUGAR 4 TIMES DAILY OR AS NEEDED 400 strip 2   HYDROcodone -acetaminophen  (NORCO/VICODIN) 5-325 MG tablet Take 1 tablet by mouth every 8 (eight) hours as needed for moderate pain.     insulin  glargine (LANTUS  SOLOSTAR) 100 UNIT/ML Solostar  Pen Inject 30 Units into the skin daily. 15 mL 6   Lancet Devices MISC Use to check blood sugar TID 100 each 2   levothyroxine  (SYNTHROID ) 100 MCG tablet Take 1 tablet (100 mcg total) by mouth daily. 30 tablet 0   meloxicam  (MOBIC ) 7.5 MG tablet Take 1 tablet (7.5 mg total) by mouth daily. 90 tablet 1   metFORMIN  (GLUCOPHAGE ) 1000 MG tablet Take 1 tablet (1,000 mg total) by mouth daily with breakfast. 100 tablet 2   ondansetron  (ZOFRAN ) 4 MG tablet Take 1 tablet (4 mg total) by mouth every 8 (eight) hours as needed for nausea or vomiting. 20 tablet 2   ONE TOUCH LANCETS MISC Test blood sugars once daily Dx: E11.9 100 each 3   oxybutynin  (DITROPAN -XL) 5 MG 24 hr tablet Take 1 tablet (5 mg total) by mouth at bedtime. 100 tablet 2   pantoprazole  (PROTONIX ) 40 MG tablet  TAKE 1 TABLET BY MOUTH DAILY 100 tablet 2   potassium chloride  SA (KLOR-CON  M) 20 MEQ tablet Take 1 tablet (20 mEq total) by mouth daily. 90 tablet 0   risperiDONE  (RISPERDAL ) 1 MG tablet Take 1 tablet (1 mg total) by mouth 2 (two) times daily. 60 tablet 1   simvastatin  (ZOCOR ) 20 MG tablet Take 1 tablet (20 mg total) by mouth at bedtime. 100 tablet 2   terbinafine  (LAMISIL ) 1 % cream Apply 1 Application topically 2 (two) times daily. 30 g 0   tirzepatide  (MOUNJARO ) 15 MG/0.5ML Pen Inject 15 mg into the skin once a week. 6 mL 1   traZODone  (DESYREL ) 100 MG tablet Take 2 tablets (200 mg total) by mouth at bedtime. 60 tablet 1   VITAMIN E PO Take 1 capsule by mouth daily.     No current facility-administered medications for this visit.    Allergies as of 12/01/2023 - Review Complete 12/01/2023  Allergen Reaction Noted   Shrimp (diagnostic) Anaphylaxis 10/29/2021   Ace inhibitors Cough 09/21/2010    Family History  Problem Relation Age of Onset   Diabetes Mother    Hyperlipidemia Mother    Hypertension Mother    Thyroid  disease Mother    Rheum arthritis Mother    COPD Mother    Lung cancer Father        smoker   Prostate  cancer Paternal Grandfather    Coronary artery disease Other    Colon cancer Neg Hx    Colon polyps Neg Hx    Esophageal cancer Neg Hx    Rectal cancer Neg Hx    Stomach cancer Neg Hx    Pancreatic cancer Neg Hx    Sleep apnea Neg Hx     Social History   Socioeconomic History   Marital status: Married    Spouse name: Not on file   Number of children: 5   Years of education: Not on file   Highest education level: Bachelor's degree (e.g., BA, AB, BS)  Occupational History   Occupation: unemployed  Tobacco Use   Smoking status: Former    Current packs/day: 0.00    Average packs/day: 1 pack/day for 10.0 years (10.0 ttl pk-yrs)    Types: Cigarettes    Start date: 07/29/1974    Quit date: 07/29/1984    Years since quitting: 39.3   Smokeless tobacco: Never  Vaping Use   Vaping status: Never Used  Substance and Sexual Activity   Alcohol use: Yes    Comment: occassionally   Drug use: No   Sexual activity: Yes  Other Topics Concern   Not on file  Social History Narrative   Married for 32 years    Six children- all live around here. Two daughters live with her   10 grand children    On Disability for mental issues   No pets   Likes to go to movies and out to dinner. She likes to do puzzle books. Likes to read      She eats a healthy diet   She does not exercise    Lives at home with husband and granddaughter   Caffeine: 4 cups/day   Social Drivers of Corporate investment banker Strain: Low Risk  (09/18/2023)   Overall Financial Resource Strain (CARDIA)    Difficulty of Paying Living Expenses: Not hard at all  Food Insecurity: No Food Insecurity (09/18/2023)   Hunger Vital Sign    Worried About Running Out of Food in  the Last Year: Never true    Ran Out of Food in the Last Year: Never true  Transportation Needs: No Transportation Needs (09/18/2023)   PRAPARE - Administrator, Civil Service (Medical): No    Lack of Transportation (Non-Medical): No  Physical  Activity: Unknown (09/18/2023)   Exercise Vital Sign    Days of Exercise per Week: 1 day    Minutes of Exercise per Session: Patient declined  Stress: Patient Declined (09/18/2023)   [employer] of Occupational Health - Occupational Stress Questionnaire    Feeling of Stress : Patient declined  Social Connections: Socially Integrated (09/18/2023)   Social Connection and Isolation Panel [NHANES]    Frequency of Communication with Friends and Family: More than three times a week    Frequency of Social Gatherings with Friends and Family: Twice a week    Attends Religious Services: More than 4 times per year    Active Member of [employer] or Organizations: Yes    Attends Engineer, structural: More than 4 times per year    Marital Status: Married  Catering manager Violence: Not At Risk (03/14/2023)   Humiliation, Afraid, Rape, and Kick questionnaire    Fear of Current or Ex-Partner: No    Emotionally Abused: No    Physically Abused: No    Sexually Abused: No    Review of Systems:    Constitutional: No weight loss, fever, chills, weakness or fatigue HEENT: Eyes: No change in vision               Ears, Nose, Throat:  No change in hearing or congestion Skin: No rash or itching Cardiovascular: No chest pain, chest pressure or palpitations   Respiratory: No SOB or cough Gastrointestinal: See HPI and otherwise negative Genitourinary: No dysuria or change in urinary frequency Neurological: No headache, dizziness or syncope Musculoskeletal: No new muscle or joint pain Hematologic: No bleeding or bruising Psychiatric: No history of depression or anxiety    Physical Exam:  Vital signs: BP 110/70 (BP Location: Left Arm, Patient Position: Sitting, Cuff Size: Normal)   Pulse 76   Ht 5\' 1"  (1.549 m)   Wt 164 lb (74.4 kg) Comment: height measured without shoes  BMI 30.99 kg/m   Constitutional: NAD, alert and cooperative Head:  Normocephalic and atraumatic. Eyes:   PEERL, EOMI. No  icterus. Conjunctiva pink. Respiratory: Respirations even and unlabored. Lungs clear to auscultation bilaterally.   No wheezes, crackles, or rhonchi.  Cardiovascular:  Regular rate and rhythm. No peripheral edema, cyanosis or pallor.  Gastrointestinal:  Soft, nondistended, nontender. No rebound or guarding. Normal bowel sounds. No appreciable masses or hepatomegaly. Rectal:  Declines Msk:  Symmetrical without gross deformities. Without edema, no deformity or joint abnormality.  Neurologic:  Alert and  oriented x4;  grossly normal neurologically.  Skin:   Dry and intact without significant lesions or rashes. Psychiatric: Oriented to person, place and time. Demonstrates good judgement and reason without abnormal affect or behaviors.  Physical Exam ABDOMEN: Normal bowel sounds   RELEVANT LABS AND IMAGING: CBC    Component Value Date/Time   WBC 8.3 11/06/2023 0740   RBC 4.33 11/06/2023 0740   HGB 13.8 11/06/2023 0740   HCT 41.0 11/06/2023 0740   PLT 386.0 11/06/2023 0740   MCV 94.9 11/06/2023 0740   MCH 31.4 06/06/2022 0349   MCHC 33.6 11/06/2023 0740   RDW 13.4 11/06/2023 0740   LYMPHSABS 2.4 11/06/2023 0740   MONOABS 0.7 11/06/2023 0740  EOSABS 0.2 11/06/2023 0740   BASOSABS 0.0 11/06/2023 0740    CMP     Component Value Date/Time   NA 143 11/06/2023 0740   K 4.0 11/06/2023 0740   CL 103 11/06/2023 0740   CO2 31 11/06/2023 0740   GLUCOSE 161 (H) 11/06/2023 0740   BUN 5 (L) 11/06/2023 0740   CREATININE 0.80 11/06/2023 0740   CREATININE 0.60 09/19/2015 0932   CALCIUM 9.2 11/06/2023 0740   PROT 7.0 11/06/2023 0740   ALBUMIN 4.2 11/06/2023 0740   AST 24 11/06/2023 0740   ALT 33 11/06/2023 0740   ALKPHOS 72 11/06/2023 0740   BILITOT 0.9 11/06/2023 0740   GFRNONAA >60 06/06/2022 0349   GFRNONAA >89 09/19/2015 0932   GFRAA >89 09/19/2015 0932     Assessment/Plan:   Overflow diarrhea CT 02/2023 unrevealing. Seen December 2024 with KUB showing moderate stool burden.   Exacerbated by Mounjaro  use.  No improvement on MiraLAX .  Presence of steatorrhea as well. - MiraLAX  bowel purge - After MiraLAX  bowel purge is complete please start Linzess 145 mcg - Pancreatic fecal elastase due to to steatorrhea  Nausea/early satiety History of gastric bypass Suspected gastroparesis exacerbated by Mounjaro  use -Discussed diet with small, frequent meals, soft diet with the patient.  -Can refer to dietician and encouraged weight loss with better control of A1C -Will need to optimize bowel regimen. Can consider trial of motegrity if no improvement with linzess.    Alexandra [name] [facility] 12/01/2023, 10:34 AM  Cc: Alexandra Atta, NP

## 2023-12-01 NOTE — Patient Instructions (Addendum)
 Alexandra McMichael, PA-C, recommends that you complete a bowel purge (to clean out your bowels). Please do the following: Purchase a bottle of Miralax  over the counter as well as a box of 5 mg dulcolax tablets. Take 4 dulcolax tablets. Wait 1 hour. You will then drink 6-8 capfuls of Miralax  mixed in an adequate amount of water /juice/gatorade (you may choose which of these liquids to drink) over the next 2-3 hours. You should expect results within 1 to 6 hours after completing the bowel purge.  Your provider has requested that you go to the basement level for lab work before leaving today. Press "B" on the elevator. The lab is located at the first door on the left as you exit the elevator.   We have given you samples of the following medication to take: Linzess 145 mcg daily  We have sent the following medications to your pharmacy for you to pick up at your convenience: Linzess 145 mcg daily  _______________________________________________________  If your blood pressure at your visit was 140/90 or greater, please contact your primary care physician to follow up on this.  _______________________________________________________  If you are age 64 or older, your body mass index should be between 23-30. Your Body mass index is 30.99 kg/m. If this is out of the aforementioned range listed, please consider follow up with your Primary Care Provider.  If you are age 16 or younger, your body mass index should be between 19-25. Your Body mass index is 30.99 kg/m. If this is out of the aformentioned range listed, please consider follow up with your Primary Care Provider.   ________________________________________________________  The [facility] providers would like to encourage you to use MYCHART to communicate with providers for non-urgent requests or questions.  Due to long hold times on the telephone, sending your provider a message by [facility] may be a faster and more efficient way to get a  response.  Please allow 48 business hours for a response.  Please remember that this is for non-urgent requests.  _______________________________________________________

## 2023-12-02 ENCOUNTER — Encounter (HOSPITAL_COMMUNITY): Payer: Self-pay | Admitting: Psychiatry

## 2023-12-02 ENCOUNTER — Other Ambulatory Visit: Payer: Self-pay | Admitting: Adult Health

## 2023-12-02 ENCOUNTER — Other Ambulatory Visit: Payer: Self-pay

## 2023-12-02 ENCOUNTER — Encounter: Payer: Self-pay | Admitting: Adult Health

## 2023-12-02 ENCOUNTER — Telehealth (HOSPITAL_BASED_OUTPATIENT_CLINIC_OR_DEPARTMENT_OTHER): Admitting: Psychiatry

## 2023-12-02 ENCOUNTER — Other Ambulatory Visit (HOSPITAL_COMMUNITY): Payer: Self-pay

## 2023-12-02 DIAGNOSIS — F250 Schizoaffective disorder, bipolar type: Secondary | ICD-10-CM

## 2023-12-02 DIAGNOSIS — F411 Generalized anxiety disorder: Secondary | ICD-10-CM | POA: Diagnosis not present

## 2023-12-02 DIAGNOSIS — F5101 Primary insomnia: Secondary | ICD-10-CM | POA: Diagnosis not present

## 2023-12-02 DIAGNOSIS — E039 Hypothyroidism, unspecified: Secondary | ICD-10-CM

## 2023-12-02 MED ORDER — LEVOTHYROXINE SODIUM 100 MCG PO TABS
100.0000 ug | ORAL_TABLET | Freq: Every day | ORAL | 3 refills | Status: AC
  Filled 2023-12-02: qty 90, 90d supply, fill #0
  Filled 2024-03-22: qty 90, 90d supply, fill #1
  Filled 2024-06-07 – 2024-06-21 (×2): qty 90, 90d supply, fill #2
  Filled 2024-07-18 – 2024-09-01 (×3): qty 90, 90d supply, fill #3

## 2023-12-02 MED ORDER — TRAZODONE HCL 100 MG PO TABS
200.0000 mg | ORAL_TABLET | Freq: Every day | ORAL | 2 refills | Status: DC
Start: 2023-12-02 — End: 2024-03-03
  Filled 2023-12-02 – 2023-12-26 (×3): qty 60, 30d supply, fill #0
  Filled 2024-01-20: qty 60, 30d supply, fill #1
  Filled 2024-02-23: qty 60, 30d supply, fill #2

## 2023-12-02 MED ORDER — FERROUS SULFATE 325 (65 FE) MG PO TBEC
325.0000 mg | DELAYED_RELEASE_TABLET | Freq: Every day | ORAL | 1 refills | Status: DC
  Filled 2023-12-02: qty 90, 90d supply, fill #0
  Filled 2024-02-23: qty 90, 90d supply, fill #1

## 2023-12-02 MED ORDER — DULOXETINE HCL 30 MG PO CPEP
30.0000 mg | ORAL_CAPSULE | Freq: Two times a day (BID) | ORAL | 2 refills | Status: DC
  Filled 2023-12-02 (×2): qty 60, 30d supply, fill #0
  Filled 2023-12-07 – 2024-01-01 (×3): qty 60, 30d supply, fill #1

## 2023-12-02 MED ORDER — RISPERIDONE 1 MG PO TABS
1.0000 mg | ORAL_TABLET | Freq: Two times a day (BID) | ORAL | 2 refills | Status: DC
  Filled 2023-12-02 – 2023-12-26 (×3): qty 60, 30d supply, fill #0
  Filled 2024-01-20: qty 60, 30d supply, fill #1
  Filled 2024-02-23: qty 60, 30d supply, fill #2

## 2023-12-02 NOTE — Progress Notes (Signed)
  Health MD Virtual Progress Note   Patient Location: Home Provider Location: Home Office  I connect with patient by video and verified that I am speaking with correct person by using two identifiers. I discussed the limitations of evaluation and management by telemedicine and the availability of in person appointments. I also discussed with the patient that there may be a patient responsible charge related to this service. The patient expressed understanding and agreed to proceed.  Alexandra [name] [id number] 63 y.o.  12/02/2023 10:24 AM  History of Present Illness:  Patient is evaluated by video session.  On the last visit we increased Cymbalta  and she is taking 30 mg twice a day.  She also started therapy at [facility] by Ramos.  She reported increase Cymbalta  helped her anxiety depression and she also noticed her knee pain is not as bad.  Patient has multiple health issues including hypothyroidism, diabetes, chronic pain, neuropathy, hypertension.  She is taking risperidone  and trazodone  along with Cymbalta .  She denies any recent hallucination, paranoia, suicidal thoughts.  She has chronic fatigue and lack of energy but able to drive and do things.  Patient told daughter got married on Easter weekend and she is very happy about.  Patient lives with her husband and 55 year old granddaughter stays with him.  Patient has 5 kids and 11 grandkids.  Patient denies tremors shakes or any EPS.  She denies drinking or using any illegal substances.  Patient is on gabapentin , hydrocodone  and muscle relaxant from pain management for her back pain and neuropathy.  She is sleeping good.  Her hemoglobin A1c remains above 7.  She is trying to watch her calorie intake.  She denies any panic attack.  She like to keep the current medicine since it is working well.  Past Psychiatric History: H/O depression, anxiety, schizoaffective disorder and overdose on Invega  and  Seroquel  in 2015. Follow-up at [facility]. As per EMR took Klonopin , Celexa , Lexapro , mirtazapine , Seroquel  and Invega . No history of drugs or alcohol. No history of abuse.    Outpatient Encounter Medications as of 12/02/2023  Medication Sig   acetaminophen  (TYLENOL ) 500 MG tablet Take 2 tablets (1,000 mg total) by mouth every 6 (six) hours as needed for mild pain or moderate pain. (Patient taking differently: Take 1,000 mg by mouth as needed for mild pain (pain score 1-3) or moderate pain (pain score 4-6).)   albuterol  (VENTOLIN  HFA) 108 (90 Base) MCG/ACT inhaler TAKE 2 PUFFS BY MOUTH EVERY 6 HOURS AS NEEDED FOR WHEEZE OR SHORTNESS OF BREATH (Patient taking differently: as needed.)   B Complex Vitamins (B COMPLEX PO) Take 1 tablet by mouth daily.   B-D UF III MINI PEN NEEDLES 31G X 5 MM MISC USE WITH INSULIN  PEN AS DIRECTED   BIOTIN PO Take 1 tablet by mouth daily.   bisoprolol -hydrochlorothiazide  (ZIAC ) 10-6.25 MG tablet Take 1 tablet by mouth daily.   Blood Glucose Monitoring Suppl (ONETOUCH VERIO FLEX SYSTEM) w/Device KIT use to check blood glucose 4 times daily or as needed   Cholecalciferol (VITAMIN D3 PO) Take 1 tablet by mouth daily.   Continuous Glucose Sensor (FREESTYLE LIBRE 3 SENSOR) MISC Place 1 sensor on the skin every 14 days. Use to check glucose continuously   DULoxetine  (CYMBALTA ) 30 MG capsule Take 1 capsule (30 mg total) by mouth 2 (two) times daily.   ferrous sulfate  325 (65 FE) MG EC tablet Take 325 mg by mouth daily with breakfast.   fluticasone  (FLONASE ) 50 MCG/ACT  nasal spray USE 2 SPRAYS IN EACH NOSTRIL DAILY (Patient taking differently: 2 sprays daily as needed for allergies or rhinitis.)   gabapentin  (NEURONTIN ) 400 MG capsule Take 2 capsules (800 mg total) by mouth 3 (three) times daily.   glucose blood (ONETOUCH VERIO) test strip USE TO CHECK BLOOD SUGAR 4 TIMES DAILY OR AS NEEDED   HYDROcodone -acetaminophen  (NORCO/VICODIN) 5-325 MG tablet Take 1 tablet by mouth every 8  (eight) hours as needed for moderate pain.   insulin  glargine (LANTUS  SOLOSTAR) 100 UNIT/ML Solostar Pen Inject 30 Units into the skin daily.   Lancet Devices MISC Use to check blood sugar TID   levothyroxine  (SYNTHROID ) 100 MCG tablet Take 1 tablet (100 mcg total) by mouth daily.   linaclotide (LINZESS) 145 MCG CAPS capsule Take 1 capsule (145 mcg total) by mouth daily before breakfast.   meloxicam  (MOBIC ) 7.5 MG tablet Take 1 tablet (7.5 mg total) by mouth daily.   metFORMIN  (GLUCOPHAGE ) 1000 MG tablet Take 1 tablet (1,000 mg total) by mouth daily with breakfast.   ondansetron  (ZOFRAN ) 4 MG tablet Take 1 tablet (4 mg total) by mouth every 8 (eight) hours as needed for nausea or vomiting.   ONE TOUCH LANCETS MISC Test blood sugars once daily Dx: E11.9   oxybutynin  (DITROPAN -XL) 5 MG 24 hr tablet Take 1 tablet (5 mg total) by mouth at bedtime.   pantoprazole  (PROTONIX ) 40 MG tablet TAKE 1 TABLET BY MOUTH DAILY   potassium chloride  SA (KLOR-CON  M) 20 MEQ tablet Take 1 tablet (20 mEq total) by mouth daily.   risperiDONE  (RISPERDAL ) 1 MG tablet Take 1 tablet (1 mg total) by mouth 2 (two) times daily.   simvastatin  (ZOCOR ) 20 MG tablet Take 1 tablet (20 mg total) by mouth at bedtime.   terbinafine  (LAMISIL ) 1 % cream Apply 1 Application topically 2 (two) times daily.   tirzepatide  (MOUNJARO ) 15 MG/0.5ML Pen Inject 15 mg into the skin once a week.   traZODone  (DESYREL ) 100 MG tablet Take 2 tablets (200 mg total) by mouth at bedtime.   VITAMIN E PO Take 1 capsule by mouth daily.   No facility-administered encounter medications on file as of 12/02/2023.    Recent Results (from the past 2160 hours)  Microalbumin/Creatinine Ratio, Urine     Status: None   Collection Time: 11/06/23  7:40 AM  Result Value Ref Range   Microalb, Ur 0.7 0.0 - 1.9 mg/dL   Creatinine,U 621.3 mg/dL   Microalb Creat Ratio 6.0 0.0 - 30.0 mg/g  Hemoglobin A1c     Status: Abnormal   Collection Time: 11/06/23  7:40 AM  Result  Value Ref Range   Hgb A1c MFr Bld 7.2 (H) 4.6 - 6.5 %    Comment: Glycemic Control Guidelines for People with Diabetes:Non Diabetic:  <6%Goal of Therapy: <7%Additional Action Suggested:  >8%   TSH     Status: Abnormal   Collection Time: 11/06/23  7:40 AM  Result Value Ref Range   TSH 0.28 (L) 0.35 - 5.50 uIU/mL  Lipid panel     Status: Abnormal   Collection Time: 11/06/23  7:40 AM  Result Value Ref Range   Cholesterol 122 0 - 200 mg/dL    Comment: ATP III Classification       Desirable:  < 200 mg/dL               Borderline High:  200 - 239 mg/dL          High:  > = 086  mg/dL   Triglycerides 16.1 0.0 - 149.0 mg/dL    Comment: Normal:  <096 mg/dLBorderline High:  150 - 199 mg/dL   HDL 04.54 (L) >09.81 mg/dL   VLDL 19.1 0.0 - 47.8 mg/dL   LDL Cholesterol 68 0 - 99 mg/dL   Total CHOL/HDL Ratio 3     Comment:                Men          Women1/2 Average Risk     3.4          3.3Average Risk          5.0          4.42X Average Risk          9.6          7.13X Average Risk          15.0          11.0                       NonHDL 85.90     Comment: NOTE:  Non-HDL goal should be 30 mg/dL higher than patient's LDL goal (i.e. LDL goal of < 70 mg/dL, would have non-HDL goal of < 100 mg/dL)  Comprehensive metabolic panel with GFR     Status: Abnormal   Collection Time: 11/06/23  7:40 AM  Result Value Ref Range   Sodium 143 135 - 145 mEq/L   Potassium 4.0 3.5 - 5.1 mEq/L   Chloride 103 96 - 112 mEq/L   CO2 31 19 - 32 mEq/L   Glucose, Bld 161 (H) 70 - 99 mg/dL   BUN 5 (L) 6 - 23 mg/dL   Creatinine, Ser 2.95 0.40 - 1.20 mg/dL   Total Bilirubin 0.9 0.2 - 1.2 mg/dL   Alkaline Phosphatase 72 39 - 117 U/L   AST 24 0 - 37 U/L   ALT 33 0 - 35 U/L   Total Protein 7.0 6.0 - 8.3 g/dL   Albumin 4.2 3.5 - 5.2 g/dL   GFR 62.13 >08.65 mL/min    Comment: Calculated using the CKD-EPI Creatinine Equation (2021)   Calcium 9.2 8.4 - 10.5 mg/dL  CBC with Differential/Platelet     Status: None   Collection  Time: 11/06/23  7:40 AM  Result Value Ref Range   WBC 8.3 4.0 - 10.5 K/uL   RBC 4.33 3.87 - 5.11 Mil/uL   Hemoglobin 13.8 12.0 - 15.0 g/dL   HCT 78.4 69.6 - 29.5 %   MCV 94.9 78.0 - 100.0 fl   MCHC 33.6 30.0 - 36.0 g/dL   RDW 28.4 13.2 - 44.0 %   Platelets 386.0 150.0 - 400.0 K/uL   Neutrophils Relative % 59.8 43.0 - 77.0 %   Lymphocytes Relative 29.6 12.0 - 46.0 %   Monocytes Relative 8.0 3.0 - 12.0 %   Eosinophils Relative 2.3 0.0 - 5.0 %   Basophils Relative 0.3 0.0 - 3.0 %   Neutro Abs 4.9 1.4 - 7.7 K/uL   Lymphs Abs 2.4 0.7 - 4.0 K/uL   Monocytes Absolute 0.7 0.1 - 1.0 K/uL   Eosinophils Absolute 0.2 0.0 - 0.7 K/uL   Basophils Absolute 0.0 0.0 - 0.1 K/uL  TSH     Status: None   Collection Time: 12/01/23 10:49 AM  Result Value Ref Range   TSH 0.70 0.35 - 5.50 uIU/mL     Psychiatric Specialty Exam: Physical Exam  Review of Systems  Musculoskeletal:        Knee pain    Weight 164 lb (74.4 kg).There is no height or weight on file to calculate BMI.  General Appearance: Casual  Eye Contact:  Fair  Speech:  Slow  Volume:  Decreased  Mood:  Anxious  Affect:  Congruent  Thought Process:  Goal Directed  Orientation:  Full (Time, Place, and Person)  Thought Content:  Rumination  Suicidal Thoughts:  No  Homicidal Thoughts:  No  Memory:  Immediate;   Fair Recent;   Fair Remote;   Fair  Judgement:  Intact  Insight:  Present  Psychomotor Activity:  Normal  Concentration:  Concentration: Fair and Attention Span: Fair  Recall:  Fair  Fund of Knowledge:  Good  Language:  Good  Akathisia:  No  Handed:  Right  AIMS (if indicated):     Assets:  Communication Skills Desire for Improvement Housing Social Support Transportation  ADL's:  Intact  Cognition:  WNL  Sleep:  ok       09/02/2023   10:03 AM 03/14/2023    9:30 AM 05/03/2022    8:14 AM 03/08/2022    3:40 PM 08/02/2021   10:52 AM  Depression screen PHQ 2/9  Decreased Interest 0 0 0 0 0  Down, Depressed,  Hopeless 0 0 0 0 0  PHQ - 2 Score 0 0 0 0 0  Altered sleeping   0    Tired, decreased energy   0    Change in appetite   0    Feeling bad or failure about yourself    0    Trouble concentrating   0    Moving slowly or fidgety/restless   0    Suicidal thoughts   0    PHQ-9 Score   0    Difficult doing work/chores   Not difficult at all      Assessment/Plan: GAD (generalized anxiety disorder) - Plan: DULoxetine  (CYMBALTA ) 30 MG capsule, traZODone  (DESYREL ) 100 MG tablet, risperiDONE  (RISPERDAL ) 1 MG tablet  Schizoaffective disorder, bipolar type (HCC) - Plan: DULoxetine  (CYMBALTA ) 30 MG capsule, risperiDONE  (RISPERDAL ) 1 MG tablet  Primary insomnia - Plan: traZODone  (DESYREL ) 100 MG tablet, risperiDONE  (RISPERDAL ) 1 MG tablet  Patient doing better since Cymbalta  dose adjusted and she is taking 30 mg twice a day.  So far no tremors shakes or any EPS.  Discussed polypharmacy as patient taking moderate dose of pain medicine, muscle relaxant and gabapentin .  I reviewed last labs.  Hemoglobin A1c remains 7.3.  Encouraged to keep appointment with therapist at [facility].  Continue risperidone  1 mg twice a day, Cymbalta  30 mg twice a day and trazodone  200 mg at bedtime.  Recommend to call us  back if she has any question or any concern.  Follow-up in 3 months.  Encourage watching her calorie intake.  Follow-up with primary care for blood work.   Follow Up Instructions:     I discussed the assessment and treatment plan with the patient. The patient was provided an opportunity to ask questions and all were answered. The patient agreed with the plan and demonstrated an understanding of the instructions.   The patient was advised to call back or seek an in-person evaluation if the symptoms worsen or if the condition fails to improve as anticipated.    Collaboration of Care: Other provider involved in patient's care AEB notes are available in epic to review  Patient/Guardian  was advised Release of  Information must be obtained prior to any record release in order to collaborate their care with an outside provider. Patient/Guardian was advised if they have not already done so to contact the registration department to sign all necessary forms in order for us  to release information regarding their care.   Consent: Patient/Guardian gives verbal consent for treatment and assignment of benefits for services provided during this visit. Patient/Guardian expressed understanding and agreed to proceed.     Total encounter time 26 minutes which includes face-to-face time, chart reviewed, care coordination, order entry and documentation during this encounter.   Note: This document was prepared by [employer] voice dictation technology and any errors that results from this process are unintentional.    Arturo Late, MD 12/02/2023

## 2023-12-03 ENCOUNTER — Other Ambulatory Visit: Payer: Self-pay

## 2023-12-03 DIAGNOSIS — F331 Major depressive disorder, recurrent, moderate: Secondary | ICD-10-CM | POA: Diagnosis not present

## 2023-12-03 DIAGNOSIS — F205 Residual schizophrenia: Secondary | ICD-10-CM | POA: Diagnosis not present

## 2023-12-05 ENCOUNTER — Other Ambulatory Visit

## 2023-12-05 DIAGNOSIS — K909 Intestinal malabsorption, unspecified: Secondary | ICD-10-CM | POA: Diagnosis not present

## 2023-12-05 DIAGNOSIS — R197 Diarrhea, unspecified: Secondary | ICD-10-CM

## 2023-12-07 ENCOUNTER — Other Ambulatory Visit (HOSPITAL_COMMUNITY): Payer: Self-pay

## 2023-12-10 ENCOUNTER — Other Ambulatory Visit: Payer: Self-pay

## 2023-12-10 ENCOUNTER — Other Ambulatory Visit (HOSPITAL_COMMUNITY): Payer: Self-pay

## 2023-12-10 ENCOUNTER — Encounter: Payer: Self-pay | Admitting: Family Medicine

## 2023-12-10 ENCOUNTER — Ambulatory Visit (INDEPENDENT_AMBULATORY_CARE_PROVIDER_SITE_OTHER): Admitting: Family Medicine

## 2023-12-10 VITALS — BP 134/87 | HR 84 | Temp 97.8°F | Ht 61.0 in | Wt 161.8 lb

## 2023-12-10 DIAGNOSIS — L308 Other specified dermatitis: Secondary | ICD-10-CM

## 2023-12-10 DIAGNOSIS — W57XXXA Bitten or stung by nonvenomous insect and other nonvenomous arthropods, initial encounter: Secondary | ICD-10-CM

## 2023-12-10 MED ORDER — TRIAMCINOLONE ACETONIDE 0.025 % EX OINT
1.0000 | TOPICAL_OINTMENT | Freq: Two times a day (BID) | CUTANEOUS | 0 refills | Status: AC
  Filled 2023-12-10 (×2): qty 30, 15d supply, fill #0

## 2023-12-10 NOTE — Progress Notes (Signed)
 Established Patient Office Visit   Subjective  Patient ID: Alexandra [name], female    DOB: [birth year]  Age: 63 y.o. MRN: [id number]  Chief Complaint  Patient presents with   Insect Bite    Bed bugs started wed     Patient is a 63 year old female followed by Alto Atta, NP and seen for acute concern.  Patient endorses pruritus and rash on left upper arm x 1 week.  Patient states her husband has similar rash with itching on his right side.  Patient states he began itching on 4/27 after getting out of the hospital, then she started itching.    Patient states her husband sent a picture of a bug to her this morning.  She has not seen any.  Patient endorses wearing a short nightgown at night and sleeping on her left side.  Denies seeing any new bumps on arm.  Tried OTC cortisone cream.  States ordered spray to kill bedbugs and larva.    Patient Active Problem List   Diagnosis Date Noted   Hypokalemia 06/04/2022    Class: Acute   Sepsis due to undetermined organism (HCC) 06/03/2022   Type 2 diabetes mellitus with complication, with long-term current use of insulin  (HCC) 06/03/2022   S/P total knee arthroplasty, right 11/13/2021   Neuropathic pain 04/15/2019   Lumbar radiculopathy, right 04/15/2019   Piriformis syndrome of right side 04/15/2019   Essential hypertension 09/19/2015   Routine general medical examination at a health care facility 03/16/2015   Schizoaffective disorder-depressed type 04/21/2013   Bipolar I disorder (HCC) 03/15/2013   Hemiplegia of dominant side (HCC) 01/16/2013   TIA (transient ischemic attack) 01/16/2013   Diabetes (HCC) 01/16/2013   NEUROSIS,NOS 07/18/2009   NECK PAIN 07/18/2009   GASTRITIS 02/16/2009   BARIATRIC SURGERY STATUS 06/01/2008   HIDRADENITIS SUPPURATIVA 10/28/2007   Mixed hyperlipidemia 06/16/2007   OTHER SPECIFIED ANEMIAS 06/16/2007   HIP PAIN, RIGHT 06/16/2007   Hypothyroidism 02/16/2007   Past Medical History:  Diagnosis Date    Allergy    Anemia    past hx   Blood transfusion without reported diagnosis    Diabetes mellitus (HCC)    Fatty liver    GERD (gastroesophageal reflux disease)    prn med.   Hiatal hernia 07/29/1996   Hyperlipidemia    Hyperplastic colon polyp    Hypertension    new dx. - will start med. 06/28/2011   Hypothyroidism    Neuromuscular disorder (HCC)    hemiplegia , neuropathy   Rotator cuff tear, right    Stroke [facility])    TIA 2016   Past Surgical History:  Procedure Laterality Date   ABDOMINAL HYSTERECTOMY  06/25/2000   left salpingo-oophorectomy   BACK SURGERY  07/30/1995   discetomy, lumbar spinal fusion   CARDIAC CATHETERIZATION  04/01/2006   CESAREAN SECTION  07/29/1989   CHOLECYSTECTOMY  07/30/1987   COLONOSCOPY     EXPLORATORY LAPAROTOMY  05/08/2000   right salpingo-oophorectomy   LAMINOTOMY / EXCISION DISK POSTERIOR CERVICAL SPINE  08/10/2009   C7-T1   MENISCUS REPAIR Right 04/2021   Knee   POLYPECTOMY     [facility] 2012   ROUX-EN-Y GASTRIC BYPASS  05/19/2007   SHOULDER ARTHROSCOPY  01/02/2006; 05/31/2004   left 2007; right 2005   SHOULDER ARTHROSCOPY  07/02/2011   Procedure: ARTHROSCOPY SHOULDER;  Surgeon: Amelie Baize., MD;  Location: [facility];  Service: Orthopedics;  Laterality: Right;  repair supraspinatus, Debride glenohumeral joint, labrum  SHOULDER ARTHROSCOPY DISTAL CLAVICLE EXCISION AND OPEN ROTATOR CUFF REPAIR  07/26/2010   right   THYROID  SURGERY  early 2000s   goiter   TOTAL 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/2021   Procedure: TOTAL KNEE ARTHROPLASTY;  Surgeon: Saundra Curl, MD;  Location: [facility];  Service: Orthopedics;  Laterality: Right;   TUBAL LIGATION  1993   Social History   Tobacco Use   Smoking status: Former    Current packs/day: 0.00    Average packs/day: 1 pack/day for 10.0 years (10.0 ttl pk-yrs)    Types: Cigarettes    Start date: 07/29/1974    Quit date: 07/29/1984    Years since quitting: 39.3   Smokeless tobacco:  Never  Vaping Use   Vaping status: Never Used  Substance Use Topics   Alcohol use: Yes    Comment: occassionally   Drug use: No   Family History  Problem Relation Age of Onset   Diabetes Mother    Hyperlipidemia Mother    Hypertension Mother    Thyroid  disease Mother    Rheum arthritis Mother    COPD Mother    Lung cancer Father        smoker   Prostate cancer Paternal Grandfather    Coronary artery disease Other    Colon cancer Neg Hx    Colon polyps Neg Hx    Esophageal cancer Neg Hx    Rectal cancer Neg Hx    Stomach cancer Neg Hx    Pancreatic cancer Neg Hx    Sleep apnea Neg Hx    Allergies  Allergen Reactions   Shrimp (Diagnostic) Anaphylaxis    Eating shrimp causes swelling of tongue and rash   Ace Inhibitors Cough    ROS Negative unless stated above    Objective:      BP 134/87 (BP Location: Left Arm, Patient Position: Sitting, Cuff Size: Normal)   Pulse 84   Temp 97.8 F (36.6 C) (Oral)   Ht 5\' 1"  (1.549 m)   Wt 161 lb 12.8 oz (73.4 kg)   SpO2 97%   BMI 30.57 kg/m  BP Readings from Last 3 Encounters:  12/10/23 134/87  12/01/23 110/70  11/06/23 120/70   Wt Readings from Last 3 Encounters:  12/10/23 161 lb 12.8 oz (73.4 kg)  12/01/23 164 lb (74.4 kg)  11/06/23 164 lb (74.4 kg)      Physical Exam Constitutional:      General: She is not in acute distress.    Appearance: Normal appearance.  HENT:     Head: Normocephalic and atraumatic.     Nose: Nose normal.     Mouth/Throat:     Mouth: Mucous membranes are moist.  Cardiovascular:     Rate and Rhythm: Normal rate and regular rhythm.     Heart sounds: Normal heart sounds. No murmur heard.    No gallop.  Pulmonary:     Effort: Pulmonary effort is normal. No respiratory distress.     Breath sounds: Normal breath sounds. No wheezing, rhonchi or rales.  Skin:    General: Skin is warm and dry.          Comments: Patient with urticarial erythema with a few fine papules on left UE.  No  burrows between webspaces of fingers or rash in linear distribution.  Neurological:     Mental Status: She is alert and oriented to person, place, and time.    No results found for any visits on 12/10/23.    Assessment &  Plan:   Pruritic dermatitis -     Triamcinolone  Acetonide; Apply 1 Application topically 2 (two) times daily.  Dispense: 30 g; Refill: 0  Bedbug bite, initial encounter  Patient with pruritic, erythematous urticarial-like rash with papules of LUE.  Concern for bedbugs.  Also consider other mite such as scabies.  P.o. antihistamine and topical steroid as needed.  Discussed the importance of eradicating infestation.  Encouraged to contact exterminator.  Follow-up with PCP for continued or worsening symptoms.  Return if symptoms worsen or fail to improve.   Viola Greulich, MD

## 2023-12-10 NOTE — Patient Instructions (Signed)
 A prescription for a stronger steroid cream was sent to your pharmacy.

## 2023-12-11 ENCOUNTER — Ambulatory Visit: Payer: Self-pay | Admitting: Gastroenterology

## 2023-12-11 LAB — PANCREATIC ELASTASE, FECAL: Pancreatic Elastase-1, Stool: >800 ug/g (ref >200)

## 2023-12-12 DIAGNOSIS — F331 Major depressive disorder, recurrent, moderate: Secondary | ICD-10-CM | POA: Diagnosis not present

## 2023-12-12 DIAGNOSIS — F205 Residual schizophrenia: Secondary | ICD-10-CM | POA: Diagnosis not present

## 2023-12-17 DIAGNOSIS — F32A Depression, unspecified: Secondary | ICD-10-CM | POA: Diagnosis not present

## 2023-12-17 DIAGNOSIS — E1140 Type 2 diabetes mellitus with diabetic neuropathy, unspecified: Secondary | ICD-10-CM | POA: Diagnosis not present

## 2023-12-17 DIAGNOSIS — M25569 Pain in unspecified knee: Secondary | ICD-10-CM | POA: Diagnosis not present

## 2023-12-17 DIAGNOSIS — G8929 Other chronic pain: Secondary | ICD-10-CM | POA: Diagnosis not present

## 2023-12-17 DIAGNOSIS — M25562 Pain in left knee: Secondary | ICD-10-CM | POA: Diagnosis not present

## 2023-12-17 DIAGNOSIS — Z96659 Presence of unspecified artificial knee joint: Secondary | ICD-10-CM | POA: Diagnosis not present

## 2023-12-19 DIAGNOSIS — F205 Residual schizophrenia: Secondary | ICD-10-CM | POA: Diagnosis not present

## 2023-12-19 DIAGNOSIS — F331 Major depressive disorder, recurrent, moderate: Secondary | ICD-10-CM | POA: Diagnosis not present

## 2023-12-23 DIAGNOSIS — F331 Major depressive disorder, recurrent, moderate: Secondary | ICD-10-CM | POA: Diagnosis not present

## 2023-12-23 DIAGNOSIS — F205 Residual schizophrenia: Secondary | ICD-10-CM | POA: Diagnosis not present

## 2023-12-26 ENCOUNTER — Other Ambulatory Visit: Payer: Self-pay | Admitting: Adult Health

## 2023-12-26 ENCOUNTER — Other Ambulatory Visit (HOSPITAL_COMMUNITY): Payer: Self-pay

## 2023-12-26 ENCOUNTER — Other Ambulatory Visit: Payer: Self-pay

## 2023-12-26 DIAGNOSIS — M150 Primary generalized (osteo)arthritis: Secondary | ICD-10-CM

## 2023-12-26 MED ORDER — MELOXICAM 7.5 MG PO TABS
7.5000 mg | ORAL_TABLET | Freq: Every day | ORAL | 1 refills | Status: DC
  Filled 2023-12-26 – 2024-01-15 (×2): qty 90, 90d supply, fill #0
  Filled 2024-05-23: qty 90, 90d supply, fill #1

## 2023-12-29 ENCOUNTER — Other Ambulatory Visit (HOSPITAL_COMMUNITY): Payer: Self-pay

## 2023-12-31 ENCOUNTER — Other Ambulatory Visit: Payer: Self-pay

## 2024-01-01 ENCOUNTER — Other Ambulatory Visit (HOSPITAL_COMMUNITY): Payer: Self-pay

## 2024-01-01 ENCOUNTER — Other Ambulatory Visit: Payer: Self-pay

## 2024-01-02 ENCOUNTER — Other Ambulatory Visit (HOSPITAL_COMMUNITY): Payer: Self-pay

## 2024-01-02 ENCOUNTER — Encounter: Payer: Self-pay | Admitting: Adult Health

## 2024-01-02 DIAGNOSIS — F205 Residual schizophrenia: Secondary | ICD-10-CM | POA: Diagnosis not present

## 2024-01-02 DIAGNOSIS — F331 Major depressive disorder, recurrent, moderate: Secondary | ICD-10-CM | POA: Diagnosis not present

## 2024-01-02 DIAGNOSIS — E1140 Type 2 diabetes mellitus with diabetic neuropathy, unspecified: Secondary | ICD-10-CM

## 2024-01-02 MED ORDER — FREESTYLE LIBRE 3 SENSOR MISC: 1.0000 | 3 refills | Status: DC

## 2024-01-02 NOTE — Addendum Note (Signed)
 Addended by: Twyla Galeazzi R on: 01/02/2024 01:03 PM   Modules accepted: Orders

## 2024-01-08 ENCOUNTER — Other Ambulatory Visit: Payer: Self-pay

## 2024-01-08 ENCOUNTER — Telehealth: Payer: Self-pay | Admitting: *Deleted

## 2024-01-08 DIAGNOSIS — E1140 Type 2 diabetes mellitus with diabetic neuropathy, unspecified: Secondary | ICD-10-CM

## 2024-01-08 MED ORDER — FREESTYLE LIBRE 3 SENSOR MISC: 1.0000 | 3 refills | Status: DC

## 2024-01-08 NOTE — Telephone Encounter (Signed)
 Copied from CRM [id number]. Topic: Clinical - Medication Question >> Jan 08, 2024  3:29 PM Luane Rumps D wrote: Reason for CRM: Patient received message stating that her [facility] has been sent to [facility], but she called and they have not yet received it. She is requesting a status update.

## 2024-01-08 NOTE — Telephone Encounter (Signed)
 Copied from CRM [id number]. Topic: Clinical - Prescription Issue >> Jan 08, 2024 12:32 PM Chuck Crater wrote: Reason for CRM: Patient wants to let the Nils Baseman know that [facility] [address] has the [place] 3 plus and it needs to go there.

## 2024-01-08 NOTE — Telephone Encounter (Signed)
 This has been taking care of. See Mychart note.

## 2024-01-09 ENCOUNTER — Other Ambulatory Visit: Payer: Self-pay | Admitting: Adult Health

## 2024-01-09 DIAGNOSIS — F331 Major depressive disorder, recurrent, moderate: Secondary | ICD-10-CM | POA: Diagnosis not present

## 2024-01-09 DIAGNOSIS — E1140 Type 2 diabetes mellitus with diabetic neuropathy, unspecified: Secondary | ICD-10-CM

## 2024-01-09 DIAGNOSIS — F205 Residual schizophrenia: Secondary | ICD-10-CM | POA: Diagnosis not present

## 2024-01-09 MED ORDER — FREESTYLE LIBRE 3 SENSOR MISC: 1.0000 | 3 refills | Status: DC

## 2024-01-09 NOTE — Telephone Encounter (Unsigned)
 Copied from CRM [id number]. Topic: Clinical - Medication Refill >> Jan 09, 2024 12:09 PM Shardie S wrote: Medication: Continuous Glucose Sensor (FREESTYLE LIBRE 3  plus SENSOR) MISC  Has the patient contacted their pharmacy? Yes (Agent: If no, request that the patient contact the pharmacy for the refill. If patient does not wish to contact the pharmacy document the reason why and proceed with request.) (Agent: If yes, when and what did the pharmacy advise?)  This is the patient's preferred pharmacy:   [facility] [address] ([address]), [place] - [address] [place] ([place]) [place] [address] Phone: [phone] Fax: [phone]  Is this the correct pharmacy for this prescription? Yes If no, delete pharmacy and type the correct one.   Has the prescription been filled recently? No  Is the patient out of the medication? Yes  Has the patient been seen for an appointment in the last year OR does the patient have an upcoming appointment? Yes  Can we respond through MyChart? Yes  Agent: Please be advised that Rx refills may take up to 3 business days. We ask that you follow-up with your pharmacy.

## 2024-01-15 ENCOUNTER — Other Ambulatory Visit: Payer: Self-pay

## 2024-01-15 DIAGNOSIS — Z96659 Presence of unspecified artificial knee joint: Secondary | ICD-10-CM | POA: Diagnosis not present

## 2024-01-15 DIAGNOSIS — M25569 Pain in unspecified knee: Secondary | ICD-10-CM | POA: Diagnosis not present

## 2024-01-15 DIAGNOSIS — F32A Depression, unspecified: Secondary | ICD-10-CM | POA: Diagnosis not present

## 2024-01-15 DIAGNOSIS — G894 Chronic pain syndrome: Secondary | ICD-10-CM | POA: Diagnosis not present

## 2024-01-15 DIAGNOSIS — M25562 Pain in left knee: Secondary | ICD-10-CM | POA: Diagnosis not present

## 2024-01-15 DIAGNOSIS — E1140 Type 2 diabetes mellitus with diabetic neuropathy, unspecified: Secondary | ICD-10-CM | POA: Diagnosis not present

## 2024-01-16 DIAGNOSIS — F205 Residual schizophrenia: Secondary | ICD-10-CM | POA: Diagnosis not present

## 2024-01-16 DIAGNOSIS — F331 Major depressive disorder, recurrent, moderate: Secondary | ICD-10-CM | POA: Diagnosis not present

## 2024-01-19 ENCOUNTER — Other Ambulatory Visit (HOSPITAL_COMMUNITY): Payer: Self-pay

## 2024-01-20 ENCOUNTER — Other Ambulatory Visit: Payer: Self-pay

## 2024-01-22 DIAGNOSIS — F331 Major depressive disorder, recurrent, moderate: Secondary | ICD-10-CM | POA: Diagnosis not present

## 2024-01-22 DIAGNOSIS — F205 Residual schizophrenia: Secondary | ICD-10-CM | POA: Diagnosis not present

## 2024-01-30 DIAGNOSIS — F205 Residual schizophrenia: Secondary | ICD-10-CM | POA: Diagnosis not present

## 2024-01-30 DIAGNOSIS — F331 Major depressive disorder, recurrent, moderate: Secondary | ICD-10-CM | POA: Diagnosis not present

## 2024-02-05 ENCOUNTER — Ambulatory Visit: Admitting: Adult Health

## 2024-02-06 DIAGNOSIS — F331 Major depressive disorder, recurrent, moderate: Secondary | ICD-10-CM | POA: Diagnosis not present

## 2024-02-06 DIAGNOSIS — F205 Residual schizophrenia: Secondary | ICD-10-CM | POA: Diagnosis not present

## 2024-02-10 ENCOUNTER — Encounter: Payer: Self-pay | Admitting: Adult Health

## 2024-02-10 ENCOUNTER — Other Ambulatory Visit (HOSPITAL_COMMUNITY): Payer: Self-pay

## 2024-02-10 ENCOUNTER — Ambulatory Visit (INDEPENDENT_AMBULATORY_CARE_PROVIDER_SITE_OTHER): Admitting: Adult Health

## 2024-02-10 ENCOUNTER — Other Ambulatory Visit: Payer: Self-pay

## 2024-02-10 VITALS — BP 130/80 | HR 97 | Temp 98.1°F | Ht 61.0 in | Wt 164.0 lb

## 2024-02-10 DIAGNOSIS — E1140 Type 2 diabetes mellitus with diabetic neuropathy, unspecified: Secondary | ICD-10-CM | POA: Diagnosis not present

## 2024-02-10 DIAGNOSIS — Z7984 Long term (current) use of oral hypoglycemic drugs: Secondary | ICD-10-CM | POA: Diagnosis not present

## 2024-02-10 DIAGNOSIS — I10 Essential (primary) hypertension: Secondary | ICD-10-CM

## 2024-02-10 DIAGNOSIS — E119 Type 2 diabetes mellitus without complications: Secondary | ICD-10-CM

## 2024-02-10 DIAGNOSIS — Z7985 Long-term (current) use of injectable non-insulin antidiabetic drugs: Secondary | ICD-10-CM | POA: Diagnosis not present

## 2024-02-10 DIAGNOSIS — Z794 Long term (current) use of insulin: Secondary | ICD-10-CM

## 2024-02-10 LAB — POCT GLYCOSYLATED HEMOGLOBIN (HGB A1C): Hemoglobin A1C: 6.7 % — AB (ref 4.0–5.6)

## 2024-02-10 MED ORDER — PANTOPRAZOLE SODIUM 40 MG PO TBEC
40.0000 mg | DELAYED_RELEASE_TABLET | Freq: Every day | ORAL | 2 refills | Status: AC
Start: 2024-02-10 — End: ?
  Filled 2024-02-10: qty 100, 100d supply, fill #0
  Filled 2024-05-23: qty 100, 100d supply, fill #1
  Filled 2024-06-07: qty 100, 100d supply, fill #2

## 2024-02-10 MED ORDER — ONETOUCH VERIO VI STRP
ORAL_STRIP | 2 refills | Status: AC
  Filled 2024-02-10: qty 400, 100d supply, fill #0
  Filled 2024-06-21: qty 400, 100d supply, fill #1
  Filled 2024-07-18 – 2024-09-01 (×2): qty 400, 100d supply, fill #2

## 2024-02-10 NOTE — Progress Notes (Signed)
 Subjective:    Patient ID: Alexandra [name], female    DOB: 1961-02-18, 63 y.o.   MRN: [id number]  HPI 63 year old female who  has a past medical history of Allergy, Anemia, Blood transfusion without reported diagnosis, Diabetes mellitus (HCC), Fatty liver, GERD (gastroesophageal reflux disease), Hiatal hernia (07/29/1996), Hyperlipidemia, Hyperplastic colon polyp, Hypertension, Hypothyroidism, Neuromuscular disorder (HCC), Rotator cuff tear, right, and Stroke (HCC).  Diabetes mellitus type II-managed with Mounjaro  15 mg weekly, metformin  1000 mg daily, and Lantus  30 units daily. She has not been using the [place] but she did not bring her reader today. She continues to drink multiple soft drinks a day.   Lab Results  Component Value Date   HGBA1C 7.2 (H) 11/06/2023   HGBA1C 7.3 (A) 08/19/2023   HGBA1C 7.6 (A) 05/22/2023   Wt Readings from Last 3 Encounters:  02/10/24 164 lb (74.4 kg)  12/10/23 161 lb 12.8 oz (73.4 kg)  12/01/23 164 lb (74.4 kg)    Hypertension - managed with Ziac  10-6.25  mg daily.  BP Readings from Last 3 Encounters:  02/10/24 130/80  12/10/23 134/87  12/01/23 110/70     Review of Systems See HPI   Past Medical History:  Diagnosis Date   Allergy    Anemia    past hx   Blood transfusion without reported diagnosis    Diabetes mellitus (HCC)    Fatty liver    GERD (gastroesophageal reflux disease)    prn med.   Hiatal hernia 07/29/1996   Hyperlipidemia    Hyperplastic colon polyp    Hypertension    new dx. - will start med. 06/28/2011   Hypothyroidism    Neuromuscular disorder (HCC)    hemiplegia , neuropathy   Rotator cuff tear, right    Stroke [facility])    TIA 2016    Social History   Socioeconomic History   Marital status: Married    Spouse name: Not on file   Number of children: 5   Years of education: Not on file   Highest education level: Bachelor's degree (e.g., BA, AB, BS)  Occupational History   Occupation: unemployed   Tobacco Use   Smoking status: Former    Current packs/day: 0.00    Average packs/day: 1 pack/day for 10.0 years (10.0 ttl pk-yrs)    Types: Cigarettes    Start date: 07/29/1974    Quit date: 07/29/1984    Years since quitting: 39.5   Smokeless tobacco: Never  Vaping Use   Vaping status: Never Used  Substance and Sexual Activity   Alcohol use: Yes    Comment: occassionally   Drug use: No   Sexual activity: Yes  Other Topics Concern   Not on file  Social History Narrative   Married for 32 years    Six children- all live around here. Two daughters live with her   10 grand children    On Disability for mental issues   No pets   Likes to go to movies and out to dinner. She likes to do puzzle books. Likes to read      She eats a healthy diet   She does not exercise    Lives at home with husband and granddaughter   Caffeine: 4 cups/day   Social Drivers of Corporate investment banker Strain: Low Risk  (09/18/2023)   Overall Financial Resource Strain (CARDIA)    Difficulty of Paying Living Expenses: Not hard at all  Food Insecurity: No Food Insecurity (  09/18/2023)   Hunger Vital Sign    Worried About Running Out of Food in the Last Year: Never true    Ran Out of Food in the Last Year: Never true  Transportation Needs: No Transportation Needs (09/18/2023)   PRAPARE - Administrator, Civil Service (Medical): No    Lack of Transportation (Non-Medical): No  Physical Activity: Unknown (09/18/2023)   Exercise Vital Sign    Days of Exercise per Week: 1 day    Minutes of Exercise per Session: Patient declined  Stress: Patient Declined (09/18/2023)   [employer] of Occupational Health - Occupational Stress Questionnaire    Feeling of Stress : Patient declined  Social Connections: Socially Integrated (09/18/2023)   Social Connection and Isolation Panel    Frequency of Communication with Friends and Family: More than three times a week    Frequency of Social Gatherings with  Friends and Family: Twice a week    Attends Religious Services: More than 4 times per year    Active Member of Clubs or Organizations: Yes    Attends Banker Meetings: More than 4 times per year    Marital Status: Married  Catering manager Violence: Not At Risk (03/14/2023)   Humiliation, Afraid, Rape, and Kick questionnaire    Fear of Current or Ex-Partner: No    Emotionally Abused: No    Physically Abused: No    Sexually Abused: No    Past Surgical History:  Procedure Laterality Date   ABDOMINAL HYSTERECTOMY  06/25/2000   left salpingo-oophorectomy   BACK SURGERY  07/30/1995   discetomy, lumbar spinal fusion   CARDIAC CATHETERIZATION  04/01/2006   CESAREAN SECTION  07/29/1989   CHOLECYSTECTOMY  07/30/1987   COLONOSCOPY     EXPLORATORY LAPAROTOMY  05/08/2000   right salpingo-oophorectomy   LAMINOTOMY / EXCISION DISK POSTERIOR CERVICAL SPINE  08/10/2009   C7-T1   MENISCUS REPAIR Right 04/2021   Knee   POLYPECTOMY     [facility] 2012   ROUX-EN-Y GASTRIC BYPASS  05/19/2007   SHOULDER ARTHROSCOPY  01/02/2006; 05/31/2004   left 2007; right 2005   SHOULDER ARTHROSCOPY  07/02/2011   Procedure: ARTHROSCOPY SHOULDER;  Surgeon: Lamar LULLA Leonor Mickey., MD;  Location: [facility];  Service: Orthopedics;  Laterality: Right;  repair supraspinatus, Debride glenohumeral joint, labrum   SHOULDER ARTHROSCOPY DISTAL CLAVICLE EXCISION AND OPEN ROTATOR CUFF REPAIR  07/26/2010   right   THYROID  SURGERY  early 2000s   goiter   TOTAL 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/2021   Procedure: TOTAL KNEE ARTHROPLASTY;  Surgeon: Beverley Evalene BIRCH, MD;  Location: [facility];  Service: Orthopedics;  Laterality: Right;   TUBAL LIGATION  1993    Family History  Problem Relation Age of Onset   Diabetes Mother    Hyperlipidemia Mother    Hypertension Mother    Thyroid  disease Mother    Rheum arthritis Mother    COPD Mother    Lung cancer Father        smoker   Prostate cancer Paternal Grandfather     Coronary artery disease Other    Colon cancer Neg Hx    Colon polyps Neg Hx    Esophageal cancer Neg Hx    Rectal cancer Neg Hx    Stomach cancer Neg Hx    Pancreatic cancer Neg Hx    Sleep apnea Neg Hx     Allergies  Allergen Reactions   Shrimp (Diagnostic) Anaphylaxis    Eating shrimp causes swelling  of tongue and rash   Ace Inhibitors Cough    Current Outpatient Medications on File Prior to Visit  Medication Sig Dispense Refill   acetaminophen  (TYLENOL ) 500 MG tablet Take 2 tablets (1,000 mg total) by mouth every 6 (six) hours as needed for mild pain or moderate pain. (Patient taking differently: Take 1,000 mg by mouth as needed for mild pain (pain score 1-3) or moderate pain (pain score 4-6).) 60 tablet 0   albuterol  (VENTOLIN  HFA) 108 (90 Base) MCG/ACT inhaler TAKE 2 PUFFS BY MOUTH EVERY 6 HOURS AS NEEDED FOR WHEEZE OR SHORTNESS OF BREATH (Patient taking differently: as needed.) 18 each 1   B Complex Vitamins (B COMPLEX PO) Take 1 tablet by mouth daily.     B-D UF III MINI PEN NEEDLES 31G X 5 MM MISC USE WITH INSULIN  PEN AS DIRECTED 100 each 3   BIOTIN PO Take 1 tablet by mouth daily.     bisoprolol -hydrochlorothiazide  (ZIAC ) 10-6.25 MG tablet Take 1 tablet by mouth daily. 100 tablet 2   Blood Glucose Monitoring Suppl (ONETOUCH VERIO FLEX SYSTEM) w/Device KIT use to check blood glucose 4 times daily or as needed 1 kit 0   Cholecalciferol (VITAMIN D3 PO) Take 1 tablet by mouth daily.     Continuous Glucose Sensor (FREESTYLE LIBRE 3 SENSOR) MISC Place 1 sensor on the skin every 14 days. Use to check glucose continuously 6 each 3   DULoxetine  (CYMBALTA ) 30 MG capsule Take 1 capsule (30 mg total) by mouth 2 (two) times daily. 60 capsule 2   ferrous sulfate  325 (65 FE) MG EC tablet Take 1 tablet (325 mg total) by mouth daily with breakfast. 90 tablet 1   fluticasone  (FLONASE ) 50 MCG/ACT nasal spray USE 2 SPRAYS IN EACH NOSTRIL DAILY (Patient taking differently: 2 sprays daily as  needed for allergies or rhinitis.) 48 g 3   gabapentin  (NEURONTIN ) 400 MG capsule Take 2 capsules (800 mg total) by mouth 3 (three) times daily. 600 capsule 2   glucose blood (ONETOUCH VERIO) test strip USE TO CHECK BLOOD SUGAR 4 TIMES DAILY OR AS NEEDED 400 strip 2   HYDROcodone -acetaminophen  (NORCO/VICODIN) 5-325 MG tablet Take 1 tablet by mouth every 8 (eight) hours as needed for moderate pain.     insulin  glargine (LANTUS  SOLOSTAR) 100 UNIT/ML Solostar Pen Inject 30 Units into the skin daily. 15 mL 6   Lancet Devices MISC Use to check blood sugar TID 100 each 2   levothyroxine  (SYNTHROID ) 100 MCG tablet Take 1 tablet (100 mcg total) by mouth daily. 90 tablet 3   linaclotide  (LINZESS ) 145 MCG CAPS capsule Take 1 capsule (145 mcg total) by mouth daily before breakfast. 30 capsule 2   meloxicam  (MOBIC ) 7.5 MG tablet Take 1 tablet (7.5 mg total) by mouth daily. 90 tablet 1   metFORMIN  (GLUCOPHAGE ) 1000 MG tablet Take 1 tablet (1,000 mg total) by mouth daily with breakfast. 100 tablet 2   ondansetron  (ZOFRAN ) 4 MG tablet Take 1 tablet (4 mg total) by mouth every 8 (eight) hours as needed for nausea or vomiting. 20 tablet 2   ONE TOUCH LANCETS MISC Test blood sugars once daily Dx: E11.9 100 each 3   oxybutynin  (DITROPAN -XL) 5 MG 24 hr tablet Take 1 tablet (5 mg total) by mouth at bedtime. 100 tablet 2   potassium chloride  SA (KLOR-CON  M) 20 MEQ tablet Take 1 tablet (20 mEq total) by mouth daily. 90 tablet 0   risperiDONE  (RISPERDAL ) 1 MG tablet Take 1  tablet (1 mg total) by mouth 2 (two) times daily. 60 tablet 2   simvastatin  (ZOCOR ) 20 MG tablet Take 1 tablet (20 mg total) by mouth at bedtime. 100 tablet 2   terbinafine  (LAMISIL ) 1 % cream Apply 1 Application topically 2 (two) times daily. 30 g 0   tirzepatide  (MOUNJARO ) 15 MG/0.5ML Pen Inject 15 mg into the skin once a week. 6 mL 1   traZODone  (DESYREL ) 100 MG tablet Take 2 tablets (200 mg total) by mouth at bedtime. 60 tablet 2   triamcinolone   (KENALOG ) 0.025 % ointment Apply to affected skin 2 (two) times daily. 30 g 0   VITAMIN E PO Take 1 capsule by mouth daily.     No current facility-administered medications on file prior to visit.    BP 130/80   Pulse 97   Temp 98.1 F (36.7 C) (Oral)   Ht 5' 1 (1.549 m)   Wt 164 lb (74.4 kg)   SpO2 98%   BMI 30.99 kg/m       Objective:   Physical Exam Vitals and nursing note reviewed.  Constitutional:      Appearance: Normal appearance. She is obese.  Cardiovascular:     Rate and Rhythm: Normal rate and regular rhythm.     Pulses: Normal pulses.     Heart sounds: Normal heart sounds.  Pulmonary:     Effort: Pulmonary effort is normal.     Breath sounds: Normal breath sounds.  Skin:    General: Skin is warm and dry.  Neurological:     General: No focal deficit present.     Mental Status: She is alert and oriented to person, place, and time.  Psychiatric:        Mood and Affect: Mood normal.        Behavior: Behavior normal.        Thought Content: Thought content normal.        Judgment: Judgment normal.       Assessment & Plan:  1. Diabetes mellitus treated with oral medication (HCC) (Primary) - POC HgB A1c- 6.7  - Continue with Metformin  1000 mg BID  - Cut back on soda and start exercising.  - Follow up in 3 months   2. Current use of insulin  (HCC) - Continue with current dose of insulin   - glucose blood (ONETOUCH VERIO) test strip; USE TO CHECK BLOOD SUGAR 4 TIMES DAILY OR AS NEEDED  Dispense: 400 strip; Refill: 2  3. Long-term current use of injectable noninsulin antidiabetic medication - Continue with Mounjaro  15 mg weekly.   4. Essential hypertension - Controlled. No change in medication   Darleene Shape, NP

## 2024-02-10 NOTE — Patient Instructions (Signed)
 Health Maintenance Due  Topic Date Due   COVID-19 Vaccine (7 - Pfizer risk 2024-25 season) 11/21/2023   OPHTHALMOLOGY EXAM  01/14/2024   Medicare Annual Wellness (AWV)  03/13/2024       09/02/2023   10:03 AM 03/14/2023    9:30 AM 05/03/2022    8:14 AM  Depression screen PHQ 2/9  Decreased Interest  0 0  Down, Depressed, Hopeless  0 0  PHQ - 2 Score  0 0  Altered sleeping   0  Tired, decreased energy   0  Change in appetite   0  Feeling bad or failure about yourself    0  Trouble concentrating   0  Moving slowly or fidgety/restless   0  Suicidal thoughts   0  PHQ-9 Score   0  Difficult doing work/chores   Not difficult at all     Information is confidential and restricted. Go to Review Flowsheets to unlock data.

## 2024-02-13 DIAGNOSIS — F331 Major depressive disorder, recurrent, moderate: Secondary | ICD-10-CM | POA: Diagnosis not present

## 2024-02-13 DIAGNOSIS — F205 Residual schizophrenia: Secondary | ICD-10-CM | POA: Diagnosis not present

## 2024-02-20 DIAGNOSIS — F331 Major depressive disorder, recurrent, moderate: Secondary | ICD-10-CM | POA: Diagnosis not present

## 2024-02-20 DIAGNOSIS — F205 Residual schizophrenia: Secondary | ICD-10-CM | POA: Diagnosis not present

## 2024-02-23 ENCOUNTER — Other Ambulatory Visit (HOSPITAL_COMMUNITY): Payer: Self-pay

## 2024-02-23 ENCOUNTER — Other Ambulatory Visit: Payer: Self-pay

## 2024-02-23 ENCOUNTER — Other Ambulatory Visit: Payer: Self-pay | Admitting: Gastroenterology

## 2024-02-23 MED ORDER — LINACLOTIDE 145 MCG PO CAPS
145.0000 ug | ORAL_CAPSULE | Freq: Every day | ORAL | 2 refills | Status: DC
  Filled 2024-02-23: qty 30, 30d supply, fill #0
  Filled 2024-03-22: qty 30, 30d supply, fill #1
  Filled 2024-05-23: qty 30, 30d supply, fill #2

## 2024-02-24 ENCOUNTER — Other Ambulatory Visit: Payer: Self-pay

## 2024-02-26 ENCOUNTER — Other Ambulatory Visit: Payer: Self-pay | Admitting: Adult Health

## 2024-02-26 DIAGNOSIS — Z1231 Encounter for screening mammogram for malignant neoplasm of breast: Secondary | ICD-10-CM

## 2024-02-27 DIAGNOSIS — F205 Residual schizophrenia: Secondary | ICD-10-CM | POA: Diagnosis not present

## 2024-02-27 DIAGNOSIS — F331 Major depressive disorder, recurrent, moderate: Secondary | ICD-10-CM | POA: Diagnosis not present

## 2024-03-03 ENCOUNTER — Telehealth (HOSPITAL_COMMUNITY): Admitting: Psychiatry

## 2024-03-03 ENCOUNTER — Other Ambulatory Visit: Payer: Self-pay

## 2024-03-03 ENCOUNTER — Other Ambulatory Visit (HOSPITAL_COMMUNITY): Payer: Self-pay

## 2024-03-03 ENCOUNTER — Encounter (HOSPITAL_COMMUNITY): Payer: Self-pay | Admitting: Psychiatry

## 2024-03-03 VITALS — Wt 164.0 lb

## 2024-03-03 DIAGNOSIS — F411 Generalized anxiety disorder: Secondary | ICD-10-CM

## 2024-03-03 DIAGNOSIS — F5101 Primary insomnia: Secondary | ICD-10-CM

## 2024-03-03 DIAGNOSIS — F250 Schizoaffective disorder, bipolar type: Secondary | ICD-10-CM

## 2024-03-03 DIAGNOSIS — R251 Tremor, unspecified: Secondary | ICD-10-CM

## 2024-03-03 MED ORDER — DULOXETINE HCL 30 MG PO CPEP
30.0000 mg | ORAL_CAPSULE | Freq: Two times a day (BID) | ORAL | 2 refills | Status: DC
  Filled 2024-03-03: qty 60, 30d supply, fill #0
  Filled 2024-04-18: qty 60, 30d supply, fill #1
  Filled 2024-05-23: qty 60, 30d supply, fill #2

## 2024-03-03 MED ORDER — TRAZODONE HCL 100 MG PO TABS
200.0000 mg | ORAL_TABLET | Freq: Every day | ORAL | 2 refills | Status: DC
  Filled 2024-03-03 – 2024-03-22 (×2): qty 60, 30d supply, fill #0
  Filled 2024-04-18: qty 60, 30d supply, fill #1
  Filled 2024-05-23: qty 60, 30d supply, fill #2

## 2024-03-03 MED ORDER — RISPERIDONE 1 MG PO TABS
1.0000 mg | ORAL_TABLET | Freq: Two times a day (BID) | ORAL | 2 refills | Status: DC
  Filled 2024-03-03 – 2024-03-22 (×2): qty 60, 30d supply, fill #0
  Filled 2024-04-18: qty 60, 30d supply, fill #1
  Filled 2024-05-23: qty 60, 30d supply, fill #2

## 2024-03-03 MED ORDER — BENZTROPINE MESYLATE 0.5 MG PO TABS
0.5000 mg | ORAL_TABLET | Freq: Every day | ORAL | 2 refills | Status: DC
  Filled 2024-03-03: qty 30, 30d supply, fill #0
  Filled 2024-03-22 – 2024-03-26 (×2): qty 30, 30d supply, fill #1
  Filled 2024-05-23: qty 30, 30d supply, fill #2

## 2024-03-03 NOTE — Progress Notes (Signed)
 [facility] MD Virtual Progress Note   Patient Location: Home Provider Location: Home Office  I connect with patient by video and verified that I am speaking with correct person by using two identifiers. I discussed the limitations of evaluation and management by telemedicine and the availability of in person appointments. I also discussed with the patient that there may be a patient responsible charge related to this service. The patient expressed understanding and agreed to proceed.  Alexandra [name] [id number] 63 y.o.  03/03/2024 10:05 AM  History of Present Illness:  Patient is evaluated by video session.  She reported Cymbalta  and respite all helping her anxiety, paranoia and she is not as anxious or depressed.  She is sleeping better with the help of trazodone .  She reported some tremors in her hand and like to address that issue.  She excited as grandkids going to start school.  Her youngest grandkid will start school first time.  Patient lives with her husband and together they have 5 kids and 11 grandkids.  Patient is very involved in the family.  She denies any recent hallucination, agitation, paranoia, mood swings.  Recently she had blood work and her hemoglobin A1c is much better.  She excited about upcoming beach trip.  She still have chronic knee pain but manageable.  She is able to drive.  She does not get overwhelmed around people.  She is able to drive and go to grocery store.  Past Psychiatric History: H/O depression, anxiety, schizoaffective disorder and overdose on Invega  and Seroquel  in 2015. Follow-up at [facility]. As per EMR took Klonopin , Celexa , Lexapro , mirtazapine , Seroquel  and Invega . No history of drugs or alcohol. No history of abuse.   Past Medical History:  Diagnosis Date   Allergy    Anemia    past hx   Blood transfusion without reported diagnosis    Diabetes mellitus (HCC)    Fatty liver    GERD (gastroesophageal reflux disease)    prn  med.   Hiatal hernia 07/29/1996   Hyperlipidemia    Hyperplastic colon polyp    Hypertension    new dx. - will start med. 06/28/2011   Hypothyroidism    Neuromuscular disorder (HCC)    hemiplegia , neuropathy   Rotator cuff tear, right    Stroke [facility])    TIA 2016    Outpatient Encounter Medications as of 03/03/2024  Medication Sig   acetaminophen  (TYLENOL ) 500 MG tablet Take 2 tablets (1,000 mg total) by mouth every 6 (six) hours as needed for mild pain or moderate pain. (Patient taking differently: Take 1,000 mg by mouth as needed for mild pain (pain score 1-3) or moderate pain (pain score 4-6).)   albuterol  (VENTOLIN  HFA) 108 (90 Base) MCG/ACT inhaler TAKE 2 PUFFS BY MOUTH EVERY 6 HOURS AS NEEDED FOR WHEEZE OR SHORTNESS OF BREATH (Patient taking differently: as needed.)   B Complex Vitamins (B COMPLEX PO) Take 1 tablet by mouth daily.   B-D UF III MINI PEN NEEDLES 31G X 5 MM MISC USE WITH INSULIN  PEN AS DIRECTED   BIOTIN PO Take 1 tablet by mouth daily.   bisoprolol -hydrochlorothiazide  (ZIAC ) 10-6.25 MG tablet Take 1 tablet by mouth daily.   Blood Glucose Monitoring Suppl (ONETOUCH VERIO FLEX SYSTEM) w/Device KIT use to check blood glucose 4 times daily or as needed   Cholecalciferol (VITAMIN D3 PO) Take 1 tablet by mouth daily.   Continuous Glucose Sensor (FREESTYLE LIBRE 3 SENSOR) MISC Place 1 sensor on the skin  every 14 days. Use to check glucose continuously   DULoxetine  (CYMBALTA ) 30 MG capsule Take 1 capsule (30 mg total) by mouth 2 (two) times daily.   ferrous sulfate  325 (65 FE) MG EC tablet Take 1 tablet (325 mg total) by mouth daily with breakfast.   fluticasone  (FLONASE ) 50 MCG/ACT nasal spray USE 2 SPRAYS IN EACH NOSTRIL DAILY (Patient taking differently: 2 sprays daily as needed for allergies or rhinitis.)   gabapentin  (NEURONTIN ) 400 MG capsule Take 2 capsules (800 mg total) by mouth 3 (three) times daily.   glucose blood (ONETOUCH VERIO) test strip USE TO CHECK BLOOD SUGAR  4 TIMES DAILY OR AS NEEDED   HYDROcodone -acetaminophen  (NORCO/VICODIN) 5-325 MG tablet Take 1 tablet by mouth every 8 (eight) hours as needed for moderate pain.   insulin  glargine (LANTUS  SOLOSTAR) 100 UNIT/ML Solostar Pen Inject 30 Units into the skin daily.   Lancet Devices MISC Use to check blood sugar TID   levothyroxine  (SYNTHROID ) 100 MCG tablet Take 1 tablet (100 mcg total) by mouth daily.   linaclotide  (LINZESS ) 145 MCG CAPS capsule Take 1 capsule (145 mcg total) by mouth daily before breakfast.   meloxicam  (MOBIC ) 7.5 MG tablet Take 1 tablet (7.5 mg total) by mouth daily.   metFORMIN  (GLUCOPHAGE ) 1000 MG tablet Take 1 tablet (1,000 mg total) by mouth daily with breakfast.   ondansetron  (ZOFRAN ) 4 MG tablet Take 1 tablet (4 mg total) by mouth every 8 (eight) hours as needed for nausea or vomiting.   ONE TOUCH LANCETS MISC Test blood sugars once daily Dx: E11.9   oxybutynin  (DITROPAN -XL) 5 MG 24 hr tablet Take 1 tablet (5 mg total) by mouth at bedtime.   pantoprazole  (PROTONIX ) 40 MG tablet Take 1 tablet (40 mg total) by mouth daily.   potassium chloride  SA (KLOR-CON  M) 20 MEQ tablet Take 1 tablet (20 mEq total) by mouth daily.   risperiDONE  (RISPERDAL ) 1 MG tablet Take 1 tablet (1 mg total) by mouth 2 (two) times daily.   simvastatin  (ZOCOR ) 20 MG tablet Take 1 tablet (20 mg total) by mouth at bedtime.   terbinafine  (LAMISIL ) 1 % cream Apply 1 Application topically 2 (two) times daily.   tirzepatide  (MOUNJARO ) 15 MG/0.5ML Pen Inject 15 mg into the skin once a week.   traZODone  (DESYREL ) 100 MG tablet Take 2 tablets (200 mg total) by mouth at bedtime.   triamcinolone  (KENALOG ) 0.025 % ointment Apply to affected skin 2 (two) times daily.   VITAMIN E PO Take 1 capsule by mouth daily.   No facility-administered encounter medications on file as of 03/03/2024.    Recent Results (from the past 2160 hours)  Pancreatic elastase, fecal     Status: None   Collection Time: 12/05/23  8:31 AM   Result Value Ref Range   Pancreatic Elastase-1, Stool >800 >200 mcg/g    Comment: . SABRA E-1 mcg/g feces    Interpretation .      <100          Severe exocrine pancreatic                    insufficiency .    100-200         Mild to moderate exocrine                    pancreatic insufficiency .      >200          Normal   POC HgB A1c  Status: Abnormal   Collection Time: 02/10/24  8:42 AM  Result Value Ref Range   Hemoglobin A1C 6.7 (A) 4.0 - 5.6 %   HbA1c POC (<> result, manual entry)     HbA1c, POC (prediabetic range)     HbA1c, POC (controlled diabetic range)       Psychiatric Specialty Exam: Physical Exam  Review of Systems  Weight 164 lb (74.4 kg).There is no height or weight on file to calculate BMI.  General Appearance: Casual  Eye Contact:  Fair  Speech:  Slow  Volume:  Normal  Mood:  Euthymic  Affect:  Appropriate  Thought Process:  Goal Directed  Orientation:  Full (Time, Place, and Person)  Thought Content:  WDL  Suicidal Thoughts:  No  Homicidal Thoughts:  No  Memory:  Immediate;   Good Recent;   Fair Remote;   Fair  Judgement:  Intact  Insight:  Present  Psychomotor Activity:  Decreased and Tremor  Concentration:  Concentration: Fair and Attention Span: Fair  Recall:  Good  Fund of Knowledge:  Fair  Language:  Fair  Akathisia:  No  Handed:  Right  AIMS (if indicated):     Assets:  Communication Skills Desire for Improvement Housing Social Support Transportation  ADL's:  Intact  Cognition:  WNL  Sleep:  ok       02/10/2024    8:26 AM 09/02/2023   10:03 AM 03/14/2023    9:30 AM 05/03/2022    8:14 AM 03/08/2022    3:40 PM  Depression screen PHQ 2/9  Decreased Interest 0 0 0 0 0  Down, Depressed, Hopeless 0 0 0 0 0  PHQ - 2 Score 0 0 0 0 0  Altered sleeping 0   0   Tired, decreased energy 0   0   Change in appetite 0   0   Feeling bad or failure about yourself  0   0   Trouble concentrating 0   0   Moving slowly or fidgety/restless 0    0   Suicidal thoughts 0   0   PHQ-9 Score 0   0   Difficult doing work/chores Not difficult at all   Not difficult at all     Assessment/Plan: Schizoaffective disorder, bipolar type (HCC) - Plan: DULoxetine  (CYMBALTA ) 30 MG capsule, risperiDONE  (RISPERDAL ) 1 MG tablet, benztropine  (COGENTIN ) 0.5 MG tablet  GAD (generalized anxiety disorder) - Plan: DULoxetine  (CYMBALTA ) 30 MG capsule, traZODone  (DESYREL ) 100 MG tablet, risperiDONE  (RISPERDAL ) 1 MG tablet  Primary insomnia - Plan: traZODone  (DESYREL ) 100 MG tablet, risperiDONE  (RISPERDAL ) 1 MG tablet  Tremor - Plan: benztropine  (COGENTIN ) 0.5 MG tablet  Patient is 64 year old Afro-American female with a history of TIA, hypertension, hypothyroidism, diabetes, chronic pain, lumbar radiculopathy, schizoaffective disorder, insomnia, generalized anxiety disorder neuropathy and tremors.  Discussed current medication and reviewed blood work results.  She is on pain medicine and also taking gabapentin .  She feel her symptoms are stable but concerned about tremors which could be medication side effects.  Will add low-dose Cogentin  however reminded if she feels it is causing the dry mouth constipation or blurry vision that she need to stopped.  She agreed with the plan.  Will continue risperidone  1 mg twice a day, Cymbalta  30 mg twice a day and trazodone  200 mg at bedtime.  Will start Cogentin  0.5 mg at bedtime.  Recommended to call back if she has any question or any concern.  Encouraged to continue [facility] appointment at [facility].  Will follow-up in 3 months.    Follow Up Instructions:     I discussed the assessment and treatment plan with the patient. The patient was provided an opportunity to ask questions and all were answered. The patient agreed with the plan and demonstrated an understanding of the instructions.   The patient was advised to call back or seek an in-person evaluation if the symptoms worsen or if the  condition fails to improve as anticipated.    Collaboration of Care: Other provider involved in patient's care AEB notes are available in epic to review  Patient/Guardian was advised Release of Information must be obtained prior to any record release in order to collaborate their care with an outside provider. Patient/Guardian was advised if they have not already done so to contact the registration department to sign all necessary forms in order for us  to release information regarding their care.   Consent: Patient/Guardian gives verbal consent for treatment and assignment of benefits for services provided during this visit. Patient/Guardian expressed understanding and agreed to proceed.     Total encounter time 25 minutes which includes face-to-face time, chart reviewed, care coordination, order entry and documentation during this encounter.   Note: This document was prepared by [employer] voice dictation technology and any errors that results from this process are unintentional.    Leni ONEIDA Client, MD 03/03/2024

## 2024-03-04 DIAGNOSIS — G894 Chronic pain syndrome: Secondary | ICD-10-CM | POA: Diagnosis not present

## 2024-03-04 DIAGNOSIS — G8929 Other chronic pain: Secondary | ICD-10-CM | POA: Diagnosis not present

## 2024-03-04 DIAGNOSIS — F32A Depression, unspecified: Secondary | ICD-10-CM | POA: Diagnosis not present

## 2024-03-04 DIAGNOSIS — Z96659 Presence of unspecified artificial knee joint: Secondary | ICD-10-CM | POA: Diagnosis not present

## 2024-03-04 DIAGNOSIS — M25562 Pain in left knee: Secondary | ICD-10-CM | POA: Diagnosis not present

## 2024-03-04 DIAGNOSIS — E1140 Type 2 diabetes mellitus with diabetic neuropathy, unspecified: Secondary | ICD-10-CM | POA: Diagnosis not present

## 2024-03-04 DIAGNOSIS — M25569 Pain in unspecified knee: Secondary | ICD-10-CM | POA: Diagnosis not present

## 2024-03-17 ENCOUNTER — Telehealth: Payer: Self-pay

## 2024-03-17 NOTE — Telephone Encounter (Signed)
 Unsuccessful attempts to reach patient on preferred number listed in notes for scheduled AWV. Left message on voicemail okay to reschedule.

## 2024-03-19 ENCOUNTER — Ambulatory Visit

## 2024-03-22 ENCOUNTER — Other Ambulatory Visit (HOSPITAL_COMMUNITY): Payer: Self-pay

## 2024-03-22 ENCOUNTER — Other Ambulatory Visit: Payer: Self-pay

## 2024-03-23 ENCOUNTER — Other Ambulatory Visit: Payer: Self-pay

## 2024-03-24 ENCOUNTER — Ambulatory Visit: Payer: Self-pay

## 2024-03-24 ENCOUNTER — Encounter: Payer: Self-pay | Admitting: Adult Health

## 2024-03-24 DIAGNOSIS — E1140 Type 2 diabetes mellitus with diabetic neuropathy, unspecified: Secondary | ICD-10-CM

## 2024-03-24 NOTE — Telephone Encounter (Signed)
 FYI Only or Action Required?: Action required by provider: Patient requesting an appointment with her provider.  Patient was last seen in primary care on 02/10/2024 by Merna Huxley, NP.  Called Nurse Triage reporting Hyperglycemia.  Symptoms began today.  Interventions attempted: Prescription medications: Mounjaro , metformin , insulin .  Symptoms are: stable.  Triage Disposition: Go to ED Now (or PCP Triage)  Patient/caregiver understands and will follow disposition?: No, refuses disposition    Copied from CRM [id number]. Topic: Clinical - Red Word Triage >> Mar 24, 2024  5:17 PM Harlene ORN wrote: Red Word that prompted transfer to Nurse Triage:  high blood sugar in the 300's, down to 262 now, and was having dizziness Reason for Disposition  [1] Blood glucose > 240 mg/dL (86.6 mmol/L) AND [7] vomiting AND [3] unable to check for ketones (in blood or urine)    Pt denies vomiting but has nausea.  Answer Assessment - Initial Assessment Questions Took nausea medication for symptoms. Blood sugar readings today were 352, 308, 212. Pt states the dizziness is stable but states she is currently stumbling around. Pt states she last ate right before call. Denies episodes of vomiting. States vision is blurry due to astigmatism, increased urination and overall malaise. Pt urged to go to ED for symptoms. Pt declined to go to ED and is requesting an appointment with her PCP for symptoms. Will route to office for follow up.     1. BLOOD GLUCOSE: What is your blood glucose level?      212 2. ONSET: When did you check the blood glucose?     Last at 3:15  3. USUAL RANGE: What is your glucose level usually? (e.g., usual fasting morning value, usual evening value)     Normal 176  5. TYPE 1 or 2:  Do you know what type of diabetes you have?  (e.g., Type 1, Type 2, Gestational; doesn't know)      Type 2  6. INSULIN : Do you take insulin ? What type of insulin (s) do you use? What is the mode of  delivery? (syringe, pen; injection or pump)?      Took insulin , metformin  and mounjaro  today.  7. DIABETES PILLS: Do you take any pills for your diabetes? If Yes, ask: Have you missed taking any pills recently?     No  8. OTHER SYMPTOMS: Do you have any symptoms? (e.g., fever, frequent urination, difficulty breathing, dizziness, weakness, vomiting)     Dizziness, lack of appetite, weakness, some nausea.  Protocols used: Diabetes - High Blood Sugar-A-AH

## 2024-03-25 ENCOUNTER — Other Ambulatory Visit (HOSPITAL_COMMUNITY): Payer: Self-pay

## 2024-03-25 MED ORDER — FREESTYLE LIBRE 3 SENSOR MISC
1.0000 | 3 refills | Status: AC
  Filled 2024-03-25 – 2024-06-07 (×3): qty 6, 84d supply, fill #0
  Filled 2024-07-18 – 2024-09-01 (×3): qty 6, 84d supply, fill #1

## 2024-03-25 NOTE — Telephone Encounter (Signed)
 Pt has been scheduled.

## 2024-03-26 DIAGNOSIS — F331 Major depressive disorder, recurrent, moderate: Secondary | ICD-10-CM | POA: Diagnosis not present

## 2024-03-26 DIAGNOSIS — F205 Residual schizophrenia: Secondary | ICD-10-CM | POA: Diagnosis not present

## 2024-03-31 ENCOUNTER — Ambulatory Visit: Admitting: Adult Health

## 2024-04-02 DIAGNOSIS — F331 Major depressive disorder, recurrent, moderate: Secondary | ICD-10-CM | POA: Diagnosis not present

## 2024-04-02 DIAGNOSIS — F205 Residual schizophrenia: Secondary | ICD-10-CM | POA: Diagnosis not present

## 2024-04-08 DIAGNOSIS — M25569 Pain in unspecified knee: Secondary | ICD-10-CM | POA: Diagnosis not present

## 2024-04-08 DIAGNOSIS — M25562 Pain in left knee: Secondary | ICD-10-CM | POA: Diagnosis not present

## 2024-04-08 DIAGNOSIS — G894 Chronic pain syndrome: Secondary | ICD-10-CM | POA: Diagnosis not present

## 2024-04-08 DIAGNOSIS — F32A Depression, unspecified: Secondary | ICD-10-CM | POA: Diagnosis not present

## 2024-04-08 DIAGNOSIS — Z79899 Other long term (current) drug therapy: Secondary | ICD-10-CM | POA: Diagnosis not present

## 2024-04-08 DIAGNOSIS — Z96659 Presence of unspecified artificial knee joint: Secondary | ICD-10-CM | POA: Diagnosis not present

## 2024-04-08 DIAGNOSIS — E1140 Type 2 diabetes mellitus with diabetic neuropathy, unspecified: Secondary | ICD-10-CM | POA: Diagnosis not present

## 2024-04-09 DIAGNOSIS — F331 Major depressive disorder, recurrent, moderate: Secondary | ICD-10-CM | POA: Diagnosis not present

## 2024-04-09 DIAGNOSIS — F205 Residual schizophrenia: Secondary | ICD-10-CM | POA: Diagnosis not present

## 2024-04-13 ENCOUNTER — Ambulatory Visit
Admission: RE | Admit: 2024-04-13 | Discharge: 2024-04-13 | Disposition: A | Discharge status: Home / Self Care | Source: Ambulatory Visit | Attending: Adult Health | Admitting: Adult Health

## 2024-04-13 DIAGNOSIS — Z1231 Encounter for screening mammogram for malignant neoplasm of breast: Secondary | ICD-10-CM | POA: Diagnosis not present

## 2024-04-15 ENCOUNTER — Other Ambulatory Visit: Payer: Self-pay | Admitting: Adult Health

## 2024-04-15 DIAGNOSIS — R928 Other abnormal and inconclusive findings on diagnostic imaging of breast: Secondary | ICD-10-CM

## 2024-04-16 DIAGNOSIS — F331 Major depressive disorder, recurrent, moderate: Secondary | ICD-10-CM | POA: Diagnosis not present

## 2024-04-16 DIAGNOSIS — F205 Residual schizophrenia: Secondary | ICD-10-CM | POA: Diagnosis not present

## 2024-04-18 ENCOUNTER — Other Ambulatory Visit: Payer: Self-pay | Admitting: Adult Health

## 2024-04-18 ENCOUNTER — Other Ambulatory Visit (HOSPITAL_COMMUNITY): Payer: Self-pay

## 2024-04-18 DIAGNOSIS — Z7985 Long-term (current) use of injectable non-insulin antidiabetic drugs: Secondary | ICD-10-CM

## 2024-04-19 ENCOUNTER — Other Ambulatory Visit (HOSPITAL_COMMUNITY): Payer: Self-pay

## 2024-04-19 ENCOUNTER — Other Ambulatory Visit: Payer: Self-pay

## 2024-04-21 ENCOUNTER — Other Ambulatory Visit (HOSPITAL_COMMUNITY): Payer: Self-pay

## 2024-04-21 ENCOUNTER — Other Ambulatory Visit: Payer: Self-pay

## 2024-04-21 MED ORDER — MOUNJARO 15 MG/0.5ML ~~LOC~~ SOAJ
15.0000 mg | SUBCUTANEOUS | 0 refills | Status: DC
  Filled 2024-04-21: qty 2, 28d supply, fill #0

## 2024-04-22 ENCOUNTER — Other Ambulatory Visit: Payer: Self-pay

## 2024-04-23 DIAGNOSIS — F205 Residual schizophrenia: Secondary | ICD-10-CM | POA: Diagnosis not present

## 2024-04-23 DIAGNOSIS — F331 Major depressive disorder, recurrent, moderate: Secondary | ICD-10-CM | POA: Diagnosis not present

## 2024-04-26 ENCOUNTER — Ambulatory Visit
Admission: RE | Admit: 2024-04-26 | Discharge: 2024-04-26 | Disposition: A | Discharge status: Home / Self Care | Source: Ambulatory Visit | Attending: Adult Health | Admitting: Adult Health

## 2024-04-26 ENCOUNTER — Other Ambulatory Visit: Payer: Self-pay | Admitting: Adult Health

## 2024-04-26 DIAGNOSIS — R928 Other abnormal and inconclusive findings on diagnostic imaging of breast: Secondary | ICD-10-CM | POA: Diagnosis not present

## 2024-04-26 DIAGNOSIS — N6310 Unspecified lump in the right breast, unspecified quadrant: Secondary | ICD-10-CM

## 2024-04-26 DIAGNOSIS — N6001 Solitary cyst of right breast: Secondary | ICD-10-CM | POA: Diagnosis not present

## 2024-04-27 ENCOUNTER — Inpatient Hospital Stay: Admission: RE | Admit: 2024-04-27 | Discharge: 2024-04-27 | Attending: Adult Health | Admitting: Adult Health

## 2024-04-27 DIAGNOSIS — N6001 Solitary cyst of right breast: Secondary | ICD-10-CM | POA: Diagnosis not present

## 2024-04-27 DIAGNOSIS — N6310 Unspecified lump in the right breast, unspecified quadrant: Secondary | ICD-10-CM

## 2024-04-30 DIAGNOSIS — F205 Residual schizophrenia: Secondary | ICD-10-CM | POA: Diagnosis not present

## 2024-04-30 DIAGNOSIS — F331 Major depressive disorder, recurrent, moderate: Secondary | ICD-10-CM | POA: Diagnosis not present

## 2024-05-12 ENCOUNTER — Other Ambulatory Visit: Payer: Self-pay

## 2024-05-12 ENCOUNTER — Other Ambulatory Visit (HOSPITAL_COMMUNITY): Payer: Self-pay

## 2024-05-12 ENCOUNTER — Encounter: Payer: Self-pay | Admitting: Adult Health

## 2024-05-12 ENCOUNTER — Ambulatory Visit: Admitting: Adult Health

## 2024-05-12 VITALS — BP 120/70 | HR 47 | Temp 98.1°F | Ht 61.0 in | Wt 165.0 lb

## 2024-05-12 DIAGNOSIS — Z7985 Long-term (current) use of injectable non-insulin antidiabetic drugs: Secondary | ICD-10-CM | POA: Diagnosis not present

## 2024-05-12 DIAGNOSIS — I10 Essential (primary) hypertension: Secondary | ICD-10-CM | POA: Diagnosis not present

## 2024-05-12 DIAGNOSIS — Z794 Long term (current) use of insulin: Secondary | ICD-10-CM

## 2024-05-12 DIAGNOSIS — Z7984 Long term (current) use of oral hypoglycemic drugs: Secondary | ICD-10-CM | POA: Diagnosis not present

## 2024-05-12 DIAGNOSIS — E119 Type 2 diabetes mellitus without complications: Secondary | ICD-10-CM | POA: Diagnosis not present

## 2024-05-12 LAB — POCT GLYCOSYLATED HEMOGLOBIN (HGB A1C): Hemoglobin A1C: 6.5 % — AB (ref 4.0–5.6)

## 2024-05-12 MED ORDER — MOUNJARO 15 MG/0.5ML ~~LOC~~ SOAJ
15.0000 mg | SUBCUTANEOUS | 1 refills | Status: DC
  Filled 2024-05-12: qty 6, 84d supply, fill #0
  Filled 2024-07-18: qty 6, 84d supply, fill #1

## 2024-05-12 NOTE — Progress Notes (Signed)
 Subjective:    Patient ID: Alexandra [name], female    DOB: May 25, 1961, 63 y.o.   MRN: [id number]  HPI 63 year old female who  has a past medical history of Allergy, Anemia, Blood transfusion without reported diagnosis, Diabetes mellitus (HCC), Fatty liver, GERD (gastroesophageal reflux disease), Hiatal hernia (07/29/1996), Hyperlipidemia, Hyperplastic colon polyp, Hypertension, Hypothyroidism, Neuromuscular disorder (HCC), Rotator cuff tear, right, and Stroke (HCC).  She presents to the office today for follow up regarding DM and HTN   Diabetes mellitus type II-managed with Mounjaro  15 mg weekly, metformin  1000 mg daily, and Lantus  30 units daily. She reports that she cut back on her soda consumption per day by 4 cans but still drinks 5 cans a day. She is having some lows in the early morning intermittently. She drinks a soda before going to bed.    CONTINUOUS GLUCOSE MONITORING RECORD INTERPRETATION                 Dates of Recording: July 18 - October 15th 2005   Sensor description: freestyle libre    Results statistics:      Average  149  Time in range 78  % Time Above 180 14  % Time above 250 7  % Time Below target 1     Lab Results  Component Value Date   HGBA1C 6.5 (A) 05/12/2024   HGBA1C 6.7 (A) 02/10/2024   HGBA1C 7.2 (H) 11/06/2023   Wt Readings from Last 3 Encounters:  05/12/24 165 lb (74.8 kg)  03/03/24 164 lb (74.4 kg)  02/10/24 164 lb (74.4 kg)   Hypertension - managed with Ziac  10-6.25  mg daily.  BP Readings from Last 3 Encounters:  05/12/24 120/70  02/10/24 130/80  12/10/23 134/87    Review of Systems See HPI   Past Medical History:  Diagnosis Date   Allergy    Anemia    past hx   Blood transfusion without reported diagnosis    Diabetes mellitus (HCC)    Fatty liver    GERD (gastroesophageal reflux disease)    prn med.   Hiatal hernia 07/29/1996   Hyperlipidemia    Hyperplastic colon polyp    Hypertension    new dx. - will start  med. 06/28/2011   Hypothyroidism    Neuromuscular disorder (HCC)    hemiplegia , neuropathy   Rotator cuff tear, right    Stroke [facility])    TIA 2016    Social History   Socioeconomic History   Marital status: Married    Spouse name: Not on file   Number of children: 5   Years of education: Not on file   Highest education level: Bachelor's degree (e.g., BA, AB, BS)  Occupational History   Occupation: unemployed  Tobacco Use   Smoking status: Former    Current packs/day: 0.00    Average packs/day: 1 pack/day for 10.0 years (10.0 ttl pk-yrs)    Types: Cigarettes    Start date: 07/29/1974    Quit date: 07/29/1984    Years since quitting: 39.8   Smokeless tobacco: Never  Vaping Use   Vaping status: Never Used  Substance and Sexual Activity   Alcohol use: Yes    Comment: occassionally   Drug use: No   Sexual activity: Yes  Other Topics Concern   Not on file  Social History Narrative   Married for 32 years    Six children- all live around here. Two daughters live with her   17 grand  children    On Disability for mental issues   No pets   Likes to go to movies and out to dinner. She likes to do puzzle books. Likes to read      She eats a healthy diet   She does not exercise    Lives at home with husband and granddaughter   Caffeine: 4 cups/day   Social Drivers of Corporate investment banker Strain: Low Risk  (09/18/2023)   Overall Financial Resource Strain (CARDIA)    Difficulty of Paying Living Expenses: Not hard at all  Food Insecurity: No Food Insecurity (09/18/2023)   Hunger Vital Sign    Worried About Running Out of Food in the Last Year: Never true    Ran Out of Food in the Last Year: Never true  Transportation Needs: No Transportation Needs (09/18/2023)   PRAPARE - Administrator, Civil Service (Medical): No    Lack of Transportation (Non-Medical): No  Physical Activity: Unknown (09/18/2023)   Exercise Vital Sign    Days of Exercise per Week: 1 day     Minutes of Exercise per Session: Patient declined  Stress: Patient Declined (09/18/2023)   [employer] of Occupational Health - Occupational Stress Questionnaire    Feeling of Stress : Patient declined  Social Connections: Socially Integrated (09/18/2023)   Social Connection and Isolation Panel    Frequency of Communication with Friends and Family: More than three times a week    Frequency of Social Gatherings with Friends and Family: Twice a week    Attends Religious Services: More than 4 times per year    Active Member of Clubs or Organizations: Yes    Attends Banker Meetings: More than 4 times per year    Marital Status: Married  Catering manager Violence: Not At Risk (03/14/2023)   Humiliation, Afraid, Rape, and Kick questionnaire    Fear of Current or Ex-Partner: No    Emotionally Abused: No    Physically Abused: No    Sexually Abused: No    Past Surgical History:  Procedure Laterality Date   ABDOMINAL HYSTERECTOMY  06/25/2000   left salpingo-oophorectomy   BACK SURGERY  07/30/1995   discetomy, lumbar spinal fusion   CARDIAC CATHETERIZATION  04/01/2006   CESAREAN SECTION  07/29/1989   CHOLECYSTECTOMY  07/30/1987   COLONOSCOPY     EXPLORATORY LAPAROTOMY  05/08/2000   right salpingo-oophorectomy   LAMINOTOMY / EXCISION DISK POSTERIOR CERVICAL SPINE  08/10/2009   C7-T1   MENISCUS REPAIR Right 04/2021   Knee   POLYPECTOMY     [facility] 2012   ROUX-EN-Y GASTRIC BYPASS  05/19/2007   SHOULDER ARTHROSCOPY  01/02/2006; 05/31/2004   left 2007; right 2005   SHOULDER ARTHROSCOPY  07/02/2011   Procedure: ARTHROSCOPY SHOULDER;  Surgeon: Lamar LULLA Leonor Mickey., MD;  Location: [facility];  Service: Orthopedics;  Laterality: Right;  repair supraspinatus, Debride glenohumeral joint, labrum   SHOULDER ARTHROSCOPY DISTAL CLAVICLE EXCISION AND OPEN ROTATOR CUFF REPAIR  07/26/2010   right   THYROID  SURGERY  early 2000s   goiter   TOTAL KNEE ARTHROPLASTY Right  11/13/2021   Procedure: TOTAL KNEE ARTHROPLASTY;  Surgeon: Beverley Evalene BIRCH, MD;  Location: [facility];  Service: Orthopedics;  Laterality: Right;   TUBAL LIGATION  1993    Family History  Problem Relation Age of Onset   Diabetes Mother    Hyperlipidemia Mother    Hypertension Mother    Thyroid  disease Mother  Rheum arthritis Mother    COPD Mother    Lung cancer Father        smoker   Prostate cancer Paternal Grandfather    Coronary artery disease Other    Colon cancer Neg Hx    Colon polyps Neg Hx    Esophageal cancer Neg Hx    Rectal cancer Neg Hx    Stomach cancer Neg Hx    Pancreatic cancer Neg Hx    Sleep apnea Neg Hx     Allergies  Allergen Reactions   Shrimp (Diagnostic) Anaphylaxis    Eating shrimp causes swelling of tongue and rash   Ace Inhibitors Cough    Current Outpatient Medications on File Prior to Visit  Medication Sig Dispense Refill   acetaminophen  (TYLENOL ) 500 MG tablet Take 2 tablets (1,000 mg total) by mouth every 6 (six) hours as needed for mild pain or moderate pain. (Patient taking differently: Take 1,000 mg by mouth as needed for mild pain (pain score 1-3) or moderate pain (pain score 4-6).) 60 tablet 0   albuterol  (VENTOLIN  HFA) 108 (90 Base) MCG/ACT inhaler TAKE 2 PUFFS BY MOUTH EVERY 6 HOURS AS NEEDED FOR WHEEZE OR SHORTNESS OF BREATH (Patient taking differently: as needed.) 18 each 1   B Complex Vitamins (B COMPLEX PO) Take 1 tablet by mouth daily.     B-D UF III MINI PEN NEEDLES 31G X 5 MM MISC USE WITH INSULIN  PEN AS DIRECTED 100 each 3   benztropine  (COGENTIN ) 0.5 MG tablet Take 1 tablet (0.5 mg total) by mouth at bedtime. 30 tablet 2   BIOTIN PO Take 1 tablet by mouth daily.     bisoprolol -hydrochlorothiazide  (ZIAC ) 10-6.25 MG tablet Take 1 tablet by mouth daily. 100 tablet 2   Blood Glucose Monitoring Suppl (ONETOUCH VERIO FLEX SYSTEM) w/Device KIT use to check blood glucose 4 times daily or as needed 1 kit 0   Cholecalciferol (VITAMIN D3  PO) Take 1 tablet by mouth daily.     Continuous Glucose Sensor (FREESTYLE LIBRE 3 SENSOR) MISC Place 1 sensor on the skin every 14 days. Use to check glucose continuously 6 each 3   DULoxetine  (CYMBALTA ) 30 MG capsule Take 1 capsule (30 mg total) by mouth 2 (two) times daily. 60 capsule 2   ferrous sulfate  325 (65 FE) MG EC tablet Take 1 tablet (325 mg total) by mouth daily with breakfast. 90 tablet 1   fluticasone  (FLONASE ) 50 MCG/ACT nasal spray USE 2 SPRAYS IN EACH NOSTRIL DAILY (Patient taking differently: 2 sprays daily as needed for allergies or rhinitis.) 48 g 3   gabapentin  (NEURONTIN ) 400 MG capsule Take 2 capsules (800 mg total) by mouth 3 (three) times daily. 600 capsule 2   glucose blood (ONETOUCH VERIO) test strip USE TO CHECK BLOOD SUGAR 4 TIMES DAILY OR AS NEEDED 400 strip 2   HYDROcodone -acetaminophen  (NORCO/VICODIN) 5-325 MG tablet Take 1 tablet by mouth every 8 (eight) hours as needed for moderate pain.     insulin  glargine (LANTUS  SOLOSTAR) 100 UNIT/ML Solostar Pen Inject 30 Units into the skin daily. 15 mL 6   Lancet Devices MISC Use to check blood sugar TID 100 each 2   levothyroxine  (SYNTHROID ) 100 MCG tablet Take 1 tablet (100 mcg total) by mouth daily. 90 tablet 3   linaclotide  (LINZESS ) 145 MCG CAPS capsule Take 1 capsule (145 mcg total) by mouth daily before breakfast. 30 capsule 2   meloxicam  (MOBIC ) 7.5 MG tablet Take 1 tablet (7.5 mg total)  by mouth daily. 90 tablet 1   metFORMIN  (GLUCOPHAGE ) 1000 MG tablet Take 1 tablet (1,000 mg total) by mouth daily with breakfast. 100 tablet 2   ondansetron  (ZOFRAN ) 4 MG tablet Take 1 tablet (4 mg total) by mouth every 8 (eight) hours as needed for nausea or vomiting. 20 tablet 2   ONE TOUCH LANCETS MISC Test blood sugars once daily Dx: E11.9 100 each 3   oxybutynin  (DITROPAN -XL) 5 MG 24 hr tablet Take 1 tablet (5 mg total) by mouth at bedtime. 100 tablet 2   pantoprazole  (PROTONIX ) 40 MG tablet Take 1 tablet (40 mg total) by mouth  daily. 100 tablet 2   potassium chloride  SA (KLOR-CON  M) 20 MEQ tablet Take 1 tablet (20 mEq total) by mouth daily. 90 tablet 0   risperiDONE  (RISPERDAL ) 1 MG tablet Take 1 tablet (1 mg total) by mouth 2 (two) times daily. 60 tablet 2   simvastatin  (ZOCOR ) 20 MG tablet Take 1 tablet (20 mg total) by mouth at bedtime. 100 tablet 2   terbinafine  (LAMISIL ) 1 % cream Apply 1 Application topically 2 (two) times daily. 30 g 0   tirzepatide  (MOUNJARO ) 15 MG/0.5ML Pen Inject 15 mg into the skin once a week for 28 days. 2 mL 0   traZODone  (DESYREL ) 100 MG tablet Take 2 tablets (200 mg total) by mouth at bedtime. 60 tablet 2   triamcinolone  (KENALOG ) 0.025 % ointment Apply to affected skin 2 (two) times daily. 30 g 0   VITAMIN E PO Take 1 capsule by mouth daily.     No current facility-administered medications on file prior to visit.    BP 120/70   Pulse (!) 47   Temp 98.1 F (36.7 C) (Oral)   Ht 5' 1 (1.549 m)   Wt 165 lb (74.8 kg)   SpO2 98%   BMI 31.18 kg/m       Objective:   Physical Exam Vitals and nursing note reviewed.  Constitutional:      Appearance: Normal appearance. She is obese.  Cardiovascular:     Rate and Rhythm: Normal rate and regular rhythm.     Pulses: Normal pulses.     Heart sounds: Normal heart sounds.  Pulmonary:     Effort: Pulmonary effort is normal.     Breath sounds: Normal breath sounds.  Skin:    General: Skin is warm and dry.  Neurological:     General: No focal deficit present.     Mental Status: She is alert and oriented to person, place, and time.  Psychiatric:        Mood and Affect: Mood normal.        Behavior: Behavior normal.        Thought Content: Thought content normal.        Judgment: Judgment normal.        Assessment & Plan:  1. Diabetes mellitus treated with oral medication (HCC) (Primary) - POC HgB A1c- 6.5 - no change in therapy  - encouraged to continue to cut out sodas   2. Long-term current use of injectable noninsulin  antidiabetic medication  - tirzepatide  (MOUNJARO ) 15 MG/0.5ML Pen; Inject 15 mg into the skin once a week for 28 days.  Dispense: 6 mL; Refill: 1  3. Current use of insulin  (HCC) - Continue with current use of insulin    4. Essential hypertension - Well controlled. No change in medication   Darleene Shape, NP

## 2024-05-12 NOTE — Patient Instructions (Signed)
 Health Maintenance Due  Topic Date Due   OPHTHALMOLOGY EXAM  01/14/2024   Influenza Vaccine  02/27/2024   Medicare Annual Wellness (AWV)  03/13/2024   COVID-19 Vaccine (7 - 2025-26 season) 03/29/2024       02/10/2024    8:26 AM 09/02/2023   10:03 AM 03/14/2023    9:30 AM  Depression screen PHQ 2/9  Decreased Interest 0  0  Down, Depressed, Hopeless 0  0  PHQ - 2 Score 0  0  Altered sleeping 0    Tired, decreased energy 0    Change in appetite 0    Feeling bad or failure about yourself  0    Trouble concentrating 0    Moving slowly or fidgety/restless 0    Suicidal thoughts 0    PHQ-9 Score 0    Difficult doing work/chores Not difficult at all       Information is confidential and restricted. Go to Review Flowsheets to unlock data.

## 2024-05-13 DIAGNOSIS — M25569 Pain in unspecified knee: Secondary | ICD-10-CM | POA: Diagnosis not present

## 2024-05-13 DIAGNOSIS — F32A Depression, unspecified: Secondary | ICD-10-CM | POA: Diagnosis not present

## 2024-05-13 DIAGNOSIS — E1140 Type 2 diabetes mellitus with diabetic neuropathy, unspecified: Secondary | ICD-10-CM | POA: Diagnosis not present

## 2024-05-13 DIAGNOSIS — M25562 Pain in left knee: Secondary | ICD-10-CM | POA: Diagnosis not present

## 2024-05-13 DIAGNOSIS — G894 Chronic pain syndrome: Secondary | ICD-10-CM | POA: Diagnosis not present

## 2024-05-13 DIAGNOSIS — Z96659 Presence of unspecified artificial knee joint: Secondary | ICD-10-CM | POA: Diagnosis not present

## 2024-05-14 DIAGNOSIS — F205 Residual schizophrenia: Secondary | ICD-10-CM | POA: Diagnosis not present

## 2024-05-21 DIAGNOSIS — F205 Residual schizophrenia: Secondary | ICD-10-CM | POA: Diagnosis not present

## 2024-05-22 DIAGNOSIS — H40033 Anatomical narrow angle, bilateral: Secondary | ICD-10-CM | POA: Diagnosis not present

## 2024-05-22 DIAGNOSIS — E119 Type 2 diabetes mellitus without complications: Secondary | ICD-10-CM | POA: Diagnosis not present

## 2024-05-23 ENCOUNTER — Other Ambulatory Visit: Payer: Self-pay | Admitting: Adult Health

## 2024-05-23 DIAGNOSIS — I10 Essential (primary) hypertension: Secondary | ICD-10-CM

## 2024-05-24 ENCOUNTER — Other Ambulatory Visit (HOSPITAL_COMMUNITY): Payer: Self-pay

## 2024-05-24 ENCOUNTER — Other Ambulatory Visit (HOSPITAL_BASED_OUTPATIENT_CLINIC_OR_DEPARTMENT_OTHER): Payer: Self-pay

## 2024-05-24 ENCOUNTER — Other Ambulatory Visit: Payer: Self-pay

## 2024-05-24 DIAGNOSIS — Z111 Encounter for screening for respiratory tuberculosis: Secondary | ICD-10-CM | POA: Diagnosis not present

## 2024-05-24 DIAGNOSIS — Z23 Encounter for immunization: Secondary | ICD-10-CM | POA: Diagnosis not present

## 2024-05-25 ENCOUNTER — Other Ambulatory Visit (HOSPITAL_COMMUNITY): Payer: Self-pay

## 2024-05-25 MED ORDER — BISOPROLOL-HYDROCHLOROTHIAZIDE 10-6.25 MG PO TABS
1.0000 | ORAL_TABLET | Freq: Every day | ORAL | 1 refills | Status: AC
  Filled 2024-05-25 – 2024-07-18 (×3): qty 100, 100d supply, fill #0
  Filled 2024-08-22: qty 100, 100d supply, fill #1

## 2024-05-25 MED ORDER — OXYBUTYNIN CHLORIDE ER 5 MG PO TB24
5.0000 mg | ORAL_TABLET | Freq: Every day | ORAL | 1 refills | Status: AC
  Filled 2024-05-25 – 2024-07-18 (×3): qty 100, 100d supply, fill #0
  Filled 2024-08-22: qty 100, 100d supply, fill #1

## 2024-05-26 DIAGNOSIS — Z111 Encounter for screening for respiratory tuberculosis: Secondary | ICD-10-CM | POA: Diagnosis not present

## 2024-05-28 DIAGNOSIS — F205 Residual schizophrenia: Secondary | ICD-10-CM | POA: Diagnosis not present

## 2024-06-02 ENCOUNTER — Other Ambulatory Visit (HOSPITAL_COMMUNITY): Payer: Self-pay

## 2024-06-02 ENCOUNTER — Telehealth (HOSPITAL_BASED_OUTPATIENT_CLINIC_OR_DEPARTMENT_OTHER): Admitting: Psychiatry

## 2024-06-02 ENCOUNTER — Encounter (HOSPITAL_COMMUNITY): Payer: Self-pay | Admitting: Psychiatry

## 2024-06-02 VITALS — Wt 165.0 lb

## 2024-06-02 DIAGNOSIS — F5101 Primary insomnia: Secondary | ICD-10-CM | POA: Diagnosis not present

## 2024-06-02 DIAGNOSIS — F411 Generalized anxiety disorder: Secondary | ICD-10-CM | POA: Diagnosis not present

## 2024-06-02 DIAGNOSIS — R251 Tremor, unspecified: Secondary | ICD-10-CM | POA: Diagnosis not present

## 2024-06-02 DIAGNOSIS — F250 Schizoaffective disorder, bipolar type: Secondary | ICD-10-CM | POA: Diagnosis not present

## 2024-06-02 MED ORDER — RISPERIDONE 1 MG PO TABS
1.0000 mg | ORAL_TABLET | Freq: Every day | ORAL | 1 refills | Status: DC
  Filled 2024-06-02 – 2024-06-21 (×3): qty 30, 30d supply, fill #0
  Filled 2024-07-18: qty 30, 30d supply, fill #1

## 2024-06-02 MED ORDER — BENZTROPINE MESYLATE 0.5 MG PO TABS
0.5000 mg | ORAL_TABLET | Freq: Every day | ORAL | 1 refills | Status: DC
  Filled 2024-06-02 – 2024-06-21 (×4): qty 30, 30d supply, fill #0
  Filled 2024-07-18: qty 30, 30d supply, fill #1

## 2024-06-02 MED ORDER — TRAZODONE HCL 100 MG PO TABS
100.0000 mg | ORAL_TABLET | Freq: Every day | ORAL | 1 refills | Status: DC
  Filled 2024-06-02 – 2024-06-21 (×3): qty 30, 30d supply, fill #0
  Filled 2024-07-18: qty 30, 30d supply, fill #1

## 2024-06-02 MED ORDER — DULOXETINE HCL 30 MG PO CPEP
30.0000 mg | ORAL_CAPSULE | Freq: Two times a day (BID) | ORAL | 1 refills | Status: DC
  Filled 2024-06-02 – 2024-06-21 (×3): qty 60, 30d supply, fill #0
  Filled 2024-07-18: qty 60, 30d supply, fill #1

## 2024-06-02 NOTE — Progress Notes (Signed)
 [facility] MD Virtual Progress Note   Patient Location: [employer] Provider Location: Home Office  I connect with patient by video and verified that I am speaking with correct person by using two identifiers. I discussed the limitations of evaluation and management by telemedicine and the availability of in person appointments. I also discussed with the patient that there may be a patient responsible charge related to this service. The patient expressed understanding and agreed to proceed.  Alexandra [name] [id number] 63 y.o.  06/02/2024 9:18 AM  History of Present Illness:  Patient is evaluated by video session.  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  She started doing volunteer work 20 hours a week which she liked.  She reported Cogentin  does not help her tremors as much.  She noticed her tremors are worsening and sometimes she is dropping things.  She also noticed dry mouth, constipation.  During conversation her thinking process is somewhat slow.  Her attention and concentration is fair.  Patient denies any recent paranoia, agitation, anger or any suicidal thoughts.  She sleeps good with the help of trazodone .  Recently seen PCP and blood work drawn.  Hemoglobin A1c improved from the past.  Patient lives with her husband.  She is no longer helping the grandkids since school started.  She usually pick up from the school but now she started volunteer work which she enjoys.  Patient denies drinking or using any illegal substances.  Her appetite is okay.  Her weight is stable.  She denies any panic attack.  She is able to drive to the grocery stores.  She excited about upcoming family reunion on Thanksgiving.  Patient's family and her husband's family are going to join on Thanksgiving.  Past Psychiatric History: H/O depression, anxiety, schizoaffective disorder and overdose on Invega  and Seroquel  in 2015. Follow-up at [facility]. As per EMR took Klonopin , Celexa ,  Lexapro , mirtazapine , Seroquel  and Invega . No history of drugs or alcohol. No history of abuse.   Past Medical History:  Diagnosis Date   Allergy    Anemia    past hx   Blood transfusion without reported diagnosis    Diabetes mellitus (HCC)    Fatty liver    GERD (gastroesophageal reflux disease)    prn med.   Hiatal hernia 07/29/1996   Hyperlipidemia    Hyperplastic colon polyp    Hypertension    new dx. - will start med. 06/28/2011   Hypothyroidism    Neuromuscular disorder (HCC)    hemiplegia , neuropathy   Rotator cuff tear, right    Stroke [facility])    TIA 2016    Outpatient Encounter Medications as of 06/02/2024  Medication Sig   acetaminophen  (TYLENOL ) 500 MG tablet Take 2 tablets (1,000 mg total) by mouth every 6 (six) hours as needed for mild pain or moderate pain. (Patient taking differently: Take 1,000 mg by mouth as needed for mild pain (pain score 1-3) or moderate pain (pain score 4-6).)   albuterol  (VENTOLIN  HFA) 108 (90 Base) MCG/ACT inhaler TAKE 2 PUFFS BY MOUTH EVERY 6 HOURS AS NEEDED FOR WHEEZE OR SHORTNESS OF BREATH (Patient taking differently: as needed.)   B Complex Vitamins (B COMPLEX PO) Take 1 tablet by mouth daily.   B-D UF III MINI PEN NEEDLES 31G X 5 MM MISC USE WITH INSULIN  PEN AS DIRECTED   benztropine  (COGENTIN ) 0.5 MG tablet Take 1 tablet (0.5 mg total) by mouth at bedtime.   BIOTIN PO Take 1 tablet by  mouth daily.   bisoprolol -hydrochlorothiazide  (ZIAC ) 10-6.25 MG tablet Take 1 tablet by mouth daily.   Blood Glucose Monitoring Suppl (ONETOUCH VERIO FLEX SYSTEM) w/Device KIT use to check blood glucose 4 times daily or as needed   Cholecalciferol (VITAMIN D3 PO) Take 1 tablet by mouth daily.   Continuous Glucose Sensor (FREESTYLE LIBRE 3 SENSOR) MISC Place 1 sensor on the skin every 14 days. Use to check glucose continuously   DULoxetine  (CYMBALTA ) 30 MG capsule Take 1 capsule (30 mg total) by mouth 2 (two) times daily.   ferrous sulfate  325 (65 FE) MG  EC tablet Take 1 tablet (325 mg total) by mouth daily with breakfast.   fluticasone  (FLONASE ) 50 MCG/ACT nasal spray USE 2 SPRAYS IN EACH NOSTRIL DAILY (Patient taking differently: 2 sprays daily as needed for allergies or rhinitis.)   gabapentin  (NEURONTIN ) 400 MG capsule Take 2 capsules (800 mg total) by mouth 3 (three) times daily.   glucose blood (ONETOUCH VERIO) test strip USE TO CHECK BLOOD SUGAR 4 TIMES DAILY OR AS NEEDED   HYDROcodone -acetaminophen  (NORCO/VICODIN) 5-325 MG tablet Take 1 tablet by mouth every 8 (eight) hours as needed for moderate pain.   insulin  glargine (LANTUS  SOLOSTAR) 100 UNIT/ML Solostar Pen Inject 30 Units into the skin daily.   Lancet Devices MISC Use to check blood sugar TID   levothyroxine  (SYNTHROID ) 100 MCG tablet Take 1 tablet (100 mcg total) by mouth daily.   linaclotide  (LINZESS ) 145 MCG CAPS capsule Take 1 capsule (145 mcg total) by mouth daily before breakfast.   meloxicam  (MOBIC ) 7.5 MG tablet Take 1 tablet (7.5 mg total) by mouth daily.   metFORMIN  (GLUCOPHAGE ) 1000 MG tablet Take 1 tablet (1,000 mg total) by mouth daily with breakfast.   ondansetron  (ZOFRAN ) 4 MG tablet Take 1 tablet (4 mg total) by mouth every 8 (eight) hours as needed for nausea or vomiting.   ONE TOUCH LANCETS MISC Test blood sugars once daily Dx: E11.9   oxybutynin  (DITROPAN -XL) 5 MG 24 hr tablet Take 1 tablet (5 mg total) by mouth at bedtime.   pantoprazole  (PROTONIX ) 40 MG tablet Take 1 tablet (40 mg total) by mouth daily.   potassium chloride  SA (KLOR-CON  M) 20 MEQ tablet Take 1 tablet (20 mEq total) by mouth daily.   risperiDONE  (RISPERDAL ) 1 MG tablet Take 1 tablet (1 mg total) by mouth 2 (two) times daily.   simvastatin  (ZOCOR ) 20 MG tablet Take 1 tablet (20 mg total) by mouth at bedtime.   terbinafine  (LAMISIL ) 1 % cream Apply 1 Application topically 2 (two) times daily.   tirzepatide  (MOUNJARO ) 15 MG/0.5ML Pen Inject 15 mg into the skin once a week for 28 days.   traZODone   (DESYREL ) 100 MG tablet Take 2 tablets (200 mg total) by mouth at bedtime.   triamcinolone  (KENALOG ) 0.025 % ointment Apply to affected skin 2 (two) times daily.   VITAMIN E PO Take 1 capsule by mouth daily.   No facility-administered encounter medications on file as of 06/02/2024.    Recent Results (from the past 2160 hours)  POC HgB A1c     Status: Abnormal   Collection Time: 05/12/24  7:49 AM  Result Value Ref Range   Hemoglobin A1C 6.5 (A) 4.0 - 5.6 %   HbA1c POC (<> result, manual entry)     HbA1c, POC (prediabetic range)     HbA1c, POC (controlled diabetic range)       Psychiatric Specialty Exam: Physical Exam  Review of Systems  Constitutional:  Dry mouth  Neurological:  Positive for numbness.       Neuropathy     Weight 165 lb (74.8 kg).There is no height or weight on file to calculate BMI.  General Appearance: Casual  Eye Contact:  Good  Speech:  Slow  Volume:  Normal  Mood:  Euthymic  Affect:  Appropriate  Thought Process:  Goal Directed  Orientation:  Full (Time, Place, and Person)  Thought Content:  WDL  Suicidal Thoughts:  No  Homicidal Thoughts:  No  Memory:  Immediate;   Good Recent;   Fair Remote;   Fair  Judgement:  Intact  Insight:  Present  Psychomotor Activity:  Tremor  Concentration:  Concentration: Fair and Attention Span: Fair  Recall:  Fair  Fund of Knowledge:  Good  Language:  Good  Akathisia:  No  Handed:  Right  AIMS (if indicated):     Assets:  Communication Skills Desire for Improvement Social Support Transportation  ADL's:  Intact  Cognition:  WNL  Sleep:  ok with Trazodone         02/10/2024    8:26 AM 09/02/2023   10:03 AM 03/14/2023    9:30 AM 05/03/2022    8:14 AM 03/08/2022    3:40 PM  Depression screen PHQ 2/9  Decreased Interest 0 0 0 0 0  Down, Depressed, Hopeless 0 0 0 0 0  PHQ - 2 Score 0 0 0 0 0  Altered sleeping 0   0   Tired, decreased energy 0   0   Change in appetite 0   0   Feeling bad or failure  about yourself  0   0   Trouble concentrating 0   0   Moving slowly or fidgety/restless 0   0   Suicidal thoughts 0   0   PHQ-9 Score 0   0   Difficult doing work/chores Not difficult at all   Not difficult at all     Assessment/Plan: Schizoaffective disorder, bipolar type (HCC) - Plan: benztropine  (COGENTIN ) 0.5 MG tablet, DULoxetine  (CYMBALTA ) 30 MG capsule, risperiDONE  (RISPERDAL ) 1 MG tablet  Tremor - Plan: benztropine  (COGENTIN ) 0.5 MG tablet  GAD (generalized anxiety disorder) - Plan: DULoxetine  (CYMBALTA ) 30 MG capsule, risperiDONE  (RISPERDAL ) 1 MG tablet, traZODone  (DESYREL ) 100 MG tablet  Primary insomnia - Plan: risperiDONE  (RISPERDAL ) 1 MG tablet, traZODone  (DESYREL ) 100 MG tablet  Patient is 63 year old African-American female with history of TIA, hypertension, hypothyroidism, diabetes, chronic pain, neuropathy, schizoaffective disorder, insomnia, generalized anxiety disorder and tremors.  Reviewed blood work results.  Hemoglobin A1c 6.5 slightly better than before.  Discussed concern of tremors, constipation, dry mouth.  Possible polypharmacy contributing to the side effects.  Though patient has history of TIA but able to drive.  Discussed to cut down the risperidone  and trazodone .  She is currently taking risperidone  1 mg twice a day and trazodone  200 mg at bedtime.  Recommend to take the risperidone  1 mg only at bedtime and trazodone  only 100 mg at bedtime.  If she cannot sleep then she can take the melatonin up to 3 to 5 mg.  Will continue Cogentin  0.5 mg to help the tremors and Cymbalta  30 mg twice a day.  If her tremors do not improve may consider a referral to neurology.  Also discussed if paranoia and hallucinations started to come back then she should call us  back immediately.  Will follow-up in 6 weeks.   Follow Up Instructions:     I discussed the assessment  and treatment plan with the patient. The patient was provided an opportunity to ask questions and all were  answered. The patient agreed with the plan and demonstrated an understanding of the instructions.   The patient was advised to call back or seek an in-person evaluation if the symptoms worsen or if the condition fails to improve as anticipated.    Collaboration of Care: Other provider involved in patient's care AEB notes are available in epic to review  Patient/Guardian was advised Release of Information must be obtained prior to any record release in order to collaborate their care with an outside provider. Patient/Guardian was advised if they have not already done so to contact the registration department to sign all necessary forms in order for us  to release information regarding their care.   Consent: Patient/Guardian gives verbal consent for treatment and assignment of benefits for services provided during this visit. Patient/Guardian expressed understanding and agreed to proceed.     Total encounter time 27 minutes which includes face-to-face time, chart reviewed, care coordination, order entry and documentation during this encounter.   Note: This document was prepared by [employer] voice dictation technology and any errors that results from this process are unintentional.    Leni ONEIDA Client, MD 06/02/2024

## 2024-06-04 DIAGNOSIS — F205 Residual schizophrenia: Secondary | ICD-10-CM | POA: Diagnosis not present

## 2024-06-07 ENCOUNTER — Other Ambulatory Visit: Payer: Self-pay | Admitting: Adult Health

## 2024-06-07 ENCOUNTER — Other Ambulatory Visit: Payer: Self-pay | Admitting: Gastroenterology

## 2024-06-07 ENCOUNTER — Other Ambulatory Visit (HOSPITAL_COMMUNITY): Payer: Self-pay

## 2024-06-07 DIAGNOSIS — E1140 Type 2 diabetes mellitus with diabetic neuropathy, unspecified: Secondary | ICD-10-CM

## 2024-06-07 DIAGNOSIS — E7800 Pure hypercholesterolemia, unspecified: Secondary | ICD-10-CM

## 2024-06-07 DIAGNOSIS — M792 Neuralgia and neuritis, unspecified: Secondary | ICD-10-CM

## 2024-06-07 DIAGNOSIS — M150 Primary generalized (osteo)arthritis: Secondary | ICD-10-CM

## 2024-06-07 MED ORDER — LINACLOTIDE 145 MCG PO CAPS
145.0000 ug | ORAL_CAPSULE | Freq: Every day | ORAL | 2 refills | Status: AC
  Filled 2024-06-07 – 2024-06-21 (×3): qty 30, 30d supply, fill #0
  Filled 2024-07-18: qty 30, 30d supply, fill #1
  Filled 2024-08-22 – 2024-09-01 (×2): qty 30, 30d supply, fill #2

## 2024-06-08 ENCOUNTER — Other Ambulatory Visit: Payer: Self-pay

## 2024-06-08 ENCOUNTER — Other Ambulatory Visit (HOSPITAL_COMMUNITY): Payer: Self-pay

## 2024-06-08 DIAGNOSIS — G894 Chronic pain syndrome: Secondary | ICD-10-CM | POA: Diagnosis not present

## 2024-06-08 DIAGNOSIS — E1140 Type 2 diabetes mellitus with diabetic neuropathy, unspecified: Secondary | ICD-10-CM | POA: Diagnosis not present

## 2024-06-08 DIAGNOSIS — M25562 Pain in left knee: Secondary | ICD-10-CM | POA: Diagnosis not present

## 2024-06-08 DIAGNOSIS — M25569 Pain in unspecified knee: Secondary | ICD-10-CM | POA: Diagnosis not present

## 2024-06-08 DIAGNOSIS — G8929 Other chronic pain: Secondary | ICD-10-CM | POA: Diagnosis not present

## 2024-06-08 DIAGNOSIS — F32A Depression, unspecified: Secondary | ICD-10-CM | POA: Diagnosis not present

## 2024-06-08 DIAGNOSIS — Z96659 Presence of unspecified artificial knee joint: Secondary | ICD-10-CM | POA: Diagnosis not present

## 2024-06-08 MED ORDER — MELOXICAM 7.5 MG PO TABS
7.5000 mg | ORAL_TABLET | Freq: Every day | ORAL | 1 refills | Status: AC
  Filled 2024-06-08 – 2024-09-01 (×5): qty 90, 90d supply, fill #0

## 2024-06-08 MED ORDER — SIMVASTATIN 20 MG PO TABS
20.0000 mg | ORAL_TABLET | Freq: Every day | ORAL | 2 refills | Status: AC
  Filled 2024-06-08 – 2024-07-18 (×2): qty 100, 100d supply, fill #0

## 2024-06-08 MED ORDER — GABAPENTIN 400 MG PO CAPS
800.0000 mg | ORAL_CAPSULE | Freq: Three times a day (TID) | ORAL | 2 refills | Status: AC
  Filled 2024-06-08: qty 600, 100d supply, fill #0
  Filled 2024-07-18: qty 600, 100d supply, fill #1

## 2024-06-08 MED ORDER — METFORMIN HCL 1000 MG PO TABS
1000.0000 mg | ORAL_TABLET | Freq: Every day | ORAL | 2 refills | Status: AC
  Filled 2024-06-08 – 2024-07-18 (×2): qty 100, 100d supply, fill #0

## 2024-06-09 ENCOUNTER — Other Ambulatory Visit: Payer: Self-pay

## 2024-06-09 ENCOUNTER — Other Ambulatory Visit (HOSPITAL_COMMUNITY): Payer: Self-pay

## 2024-06-11 ENCOUNTER — Encounter: Payer: Self-pay | Admitting: Adult Health

## 2024-06-11 ENCOUNTER — Other Ambulatory Visit (HOSPITAL_COMMUNITY): Payer: Self-pay

## 2024-06-11 ENCOUNTER — Other Ambulatory Visit: Payer: Self-pay | Admitting: Adult Health

## 2024-06-11 ENCOUNTER — Ambulatory Visit: Payer: Self-pay | Admitting: Adult Health

## 2024-06-11 ENCOUNTER — Ambulatory Visit (INDEPENDENT_AMBULATORY_CARE_PROVIDER_SITE_OTHER): Admitting: Adult Health

## 2024-06-11 VITALS — BP 120/82 | HR 82 | Temp 98.4°F | Ht 61.0 in | Wt 165.0 lb

## 2024-06-11 DIAGNOSIS — R4189 Other symptoms and signs involving cognitive functions and awareness: Secondary | ICD-10-CM

## 2024-06-11 DIAGNOSIS — E1169 Type 2 diabetes mellitus with other specified complication: Secondary | ICD-10-CM

## 2024-06-11 DIAGNOSIS — E11649 Type 2 diabetes mellitus with hypoglycemia without coma: Secondary | ICD-10-CM | POA: Diagnosis not present

## 2024-06-11 DIAGNOSIS — Z794 Long term (current) use of insulin: Secondary | ICD-10-CM

## 2024-06-11 DIAGNOSIS — F205 Residual schizophrenia: Secondary | ICD-10-CM | POA: Diagnosis not present

## 2024-06-11 LAB — VITAMIN B12: Vitamin B-12: 539 pg/mL (ref 211–911)

## 2024-06-11 LAB — VITAMIN D 25 HYDROXY (VIT D DEFICIENCY, FRACTURES): VITD: 12.76 ng/mL — ABNORMAL LOW (ref 30.00–100.00)

## 2024-06-11 MED ORDER — VITAMIN D (ERGOCALCIFEROL) 1.25 MG (50000 UNIT) PO CAPS
50000.0000 [IU] | ORAL_CAPSULE | ORAL | 1 refills | Status: AC
  Filled 2024-06-11: qty 12, 84d supply, fill #0
  Filled 2024-07-18 – 2024-09-01 (×3): qty 12, 84d supply, fill #1

## 2024-06-11 NOTE — Progress Notes (Signed)
 Subjective:    Patient ID: Alexandra [name], female    DOB: [birth year], 63 y.o.   MRN: [id number]  Diabetes   Discussed the use of AI scribe software for clinical note transcription with the patient, who gave verbal consent to proceed.  History of Present Illness   Alexandra [name] is a 63 year old female who presents with memory issues and episodes of confusion.  She experiences memory issues, such as repeating herself and forgetting her children's names and the location of items. She has gotten lost while driving, notably needing assistance from her husband three weeks ago. She often leaves the sink running after use, realizing it only upon hearing the water  later. These episodes have been ongoing for about a month. .  She experiences episodes of low blood sugar, particularly at night and sometimes during the day. She consumes four sodas daily and uses 30 units of Lantus  insulin , Mounjaro  15 mg weekly, and metformin  1000 mg daily        Review of Systems See HPI   Past Medical History:  Diagnosis Date   Allergy    Anemia    past hx   Blood transfusion without reported diagnosis    Diabetes mellitus (HCC)    Fatty liver    GERD (gastroesophageal reflux disease)    prn med.   Hiatal hernia 07/29/1996   Hyperlipidemia    Hyperplastic colon polyp    Hypertension    new dx. - will start med. 06/28/2011   Hypothyroidism    Neuromuscular disorder (HCC)    hemiplegia , neuropathy   Rotator cuff tear, right    Stroke [facility])    TIA 2016    Social History   Socioeconomic History   Marital status: Married    Spouse name: Not on file   Number of children: 5   Years of education: Not on file   Highest education level: Bachelor's degree (e.g., BA, AB, BS)  Occupational History   Occupation: unemployed  Tobacco Use   Smoking status: Former    Current packs/day: 0.00    Average packs/day: 1 pack/day for 10.0 years (10.0 ttl pk-yrs)    Types: Cigarettes     Start date: 07/29/1974    Quit date: 07/29/1984    Years since quitting: 39.8   Smokeless tobacco: Never  Vaping Use   Vaping status: Never Used  Substance and Sexual Activity   Alcohol use: Yes    Comment: occassionally   Drug use: No   Sexual activity: Yes  Other Topics Concern   Not on file  Social History Narrative   Married for 32 years    Six children- all live around here. Two daughters live with her   10 grand children    On Disability for mental issues   No pets   Likes to go to movies and out to dinner. She likes to do puzzle books. Likes to read      She eats a healthy diet   She does not exercise    Lives at home with husband and granddaughter   Caffeine: 4 cups/day   Social Drivers of Corporate Investment Banker Strain: Low Risk  (09/18/2023)   Overall Financial Resource Strain (CARDIA)    Difficulty of Paying Living Expenses: Not hard at all  Food Insecurity: No Food Insecurity (09/18/2023)   Hunger Vital Sign    Worried About Running Out of Food in the Last Year: Never true    Ran  Out of Food in the Last Year: Never true  Transportation Needs: No Transportation Needs (09/18/2023)   PRAPARE - Transportation    Lack of Transportation (Medical): No    Lack of Transportation (Non-Medical): No  Physical Activity: Unknown (09/18/2023)   Exercise Vital Sign    Days of Exercise per Week: 1 day    Minutes of Exercise per Session: Patient declined  Stress: Patient Declined (09/18/2023)   [employer] of Occupational Health - Occupational Stress Questionnaire    Feeling of Stress : Patient declined  Social Connections: Socially Integrated (09/18/2023)   Social Connection and Isolation Panel    Frequency of Communication with Friends and Family: More than three times a week    Frequency of Social Gatherings with Friends and Family: Twice a week    Attends Religious Services: More than 4 times per year    Active Member of Clubs or Organizations: Yes    Attends Tax Inspector Meetings: More than 4 times per year    Marital Status: Married  Catering Manager Violence: Not At Risk (03/14/2023)   Humiliation, Afraid, Rape, and Kick questionnaire    Fear of Current or Ex-Partner: No    Emotionally Abused: No    Physically Abused: No    Sexually Abused: No    Past Surgical History:  Procedure Laterality Date   ABDOMINAL HYSTERECTOMY  06/25/2000   left salpingo-oophorectomy   BACK SURGERY  07/30/1995   discetomy, lumbar spinal fusion   CARDIAC CATHETERIZATION  04/01/2006   CESAREAN SECTION  07/29/1989   CHOLECYSTECTOMY  07/30/1987   COLONOSCOPY     EXPLORATORY LAPAROTOMY  05/08/2000   right salpingo-oophorectomy   LAMINOTOMY / EXCISION DISK POSTERIOR CERVICAL SPINE  08/10/2009   C7-T1   MENISCUS REPAIR Right 04/2021   Knee   POLYPECTOMY     [facility] 2012   ROUX-EN-Y GASTRIC BYPASS  05/19/2007   SHOULDER ARTHROSCOPY  01/02/2006; 05/31/2004   left 2007; right 2005   SHOULDER ARTHROSCOPY  07/02/2011   Procedure: ARTHROSCOPY SHOULDER;  Surgeon: Lamar LULLA Leonor Mickey., MD;  Location: [facility];  Service: Orthopedics;  Laterality: Right;  repair supraspinatus, Debride glenohumeral joint, labrum   SHOULDER ARTHROSCOPY DISTAL CLAVICLE EXCISION AND OPEN ROTATOR CUFF REPAIR  07/26/2010   right   THYROID  SURGERY  early 2000s   goiter   TOTAL 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/2021   Procedure: TOTAL KNEE ARTHROPLASTY;  Surgeon: Beverley Evalene BIRCH, MD;  Location: [facility];  Service: Orthopedics;  Laterality: Right;   TUBAL LIGATION  1993    Family History  Problem Relation Age of Onset   Diabetes Mother    Hyperlipidemia Mother    Hypertension Mother    Thyroid  disease Mother    Rheum arthritis Mother    COPD Mother    Lung cancer Father        smoker   Prostate cancer Paternal Grandfather    Coronary artery disease Other    Colon cancer Neg Hx    Colon polyps Neg Hx    Esophageal cancer Neg Hx    Rectal cancer Neg Hx    Stomach cancer Neg  Hx    Pancreatic cancer Neg Hx    Sleep apnea Neg Hx     Allergies  Allergen Reactions   Shrimp (Diagnostic) Anaphylaxis    Eating shrimp causes swelling of tongue and rash   Ace Inhibitors Cough    Current Outpatient Medications on File Prior to Visit  Medication Sig Dispense Refill  acetaminophen  (TYLENOL ) 500 MG tablet Take 2 tablets (1,000 mg total) by mouth every 6 (six) hours as needed for mild pain or moderate pain. (Patient taking differently: Take 1,000 mg by mouth as needed for mild pain (pain score 1-3) or moderate pain (pain score 4-6).) 60 tablet 0   albuterol  (VENTOLIN  HFA) 108 (90 Base) MCG/ACT inhaler TAKE 2 PUFFS BY MOUTH EVERY 6 HOURS AS NEEDED FOR WHEEZE OR SHORTNESS OF BREATH (Patient taking differently: as needed.) 18 each 1   B Complex Vitamins (B COMPLEX PO) Take 1 tablet by mouth daily.     B-D UF III MINI PEN NEEDLES 31G X 5 MM MISC USE WITH INSULIN  PEN AS DIRECTED 100 each 3   benztropine  (COGENTIN ) 0.5 MG tablet Take 1 tablet (0.5 mg total) by mouth at bedtime. 30 tablet 1   BIOTIN PO Take 1 tablet by mouth daily.     bisoprolol -hydrochlorothiazide  (ZIAC ) 10-6.25 MG tablet Take 1 tablet by mouth daily. 100 tablet 1   Blood Glucose Monitoring Suppl (ONETOUCH VERIO FLEX SYSTEM) w/Device KIT use to check blood glucose 4 times daily or as needed 1 kit 0   Cholecalciferol (VITAMIN D3 PO) Take 1 tablet by mouth daily.     Continuous Glucose Sensor (FREESTYLE LIBRE 3 SENSOR) MISC Place 1 sensor on the skin every 14 days. Use to check glucose continuously 6 each 3   DULoxetine  (CYMBALTA ) 30 MG capsule Take 1 capsule (30 mg total) by mouth 2 (two) times daily. 60 capsule 1   ferrous sulfate  325 (65 FE) MG EC tablet Take 1 tablet (325 mg total) by mouth daily with breakfast. 90 tablet 1   fluticasone  (FLONASE ) 50 MCG/ACT nasal spray USE 2 SPRAYS IN EACH NOSTRIL DAILY (Patient taking differently: 2 sprays daily as needed for allergies or rhinitis.) 48 g 3   gabapentin   (NEURONTIN ) 400 MG capsule Take 2 capsules (800 mg total) by mouth 3 (three) times daily. 600 capsule 2   glucose blood (ONETOUCH VERIO) test strip USE TO CHECK BLOOD SUGAR 4 TIMES DAILY OR AS NEEDED 400 strip 2   HYDROcodone -acetaminophen  (NORCO/VICODIN) 5-325 MG tablet Take 1 tablet by mouth every 8 (eight) hours as needed for moderate pain.     insulin  glargine (LANTUS  SOLOSTAR) 100 UNIT/ML Solostar Pen Inject 30 Units into the skin daily. 15 mL 6   Lancet Devices MISC Use to check blood sugar TID 100 each 2   levothyroxine  (SYNTHROID ) 100 MCG tablet Take 1 tablet (100 mcg total) by mouth daily. 90 tablet 3   linaclotide  (LINZESS ) 145 MCG CAPS capsule Take 1 capsule (145 mcg total) by mouth daily before breakfast. 30 capsule 2   meloxicam  (MOBIC ) 7.5 MG tablet Take 1 tablet (7.5 mg total) by mouth daily. 90 tablet 1   metFORMIN  (GLUCOPHAGE ) 1000 MG tablet Take 1 tablet (1,000 mg total) by mouth daily with breakfast. 100 tablet 2   methocarbamol  (ROBAXIN ) 500 MG tablet Take by mouth.     ondansetron  (ZOFRAN ) 4 MG tablet Take 1 tablet (4 mg total) by mouth every 8 (eight) hours as needed for nausea or vomiting. 20 tablet 2   ONE TOUCH LANCETS MISC Test blood sugars once daily Dx: E11.9 100 each 3   oxybutynin  (DITROPAN -XL) 5 MG 24 hr tablet Take 1 tablet (5 mg total) by mouth at bedtime. 100 tablet 1   pantoprazole  (PROTONIX ) 40 MG tablet Take 1 tablet (40 mg total) by mouth daily. 100 tablet 2   potassium chloride  SA (KLOR-CON  M)  20 MEQ tablet Take 1 tablet (20 mEq total) by mouth daily. 90 tablet 0   risperiDONE  (RISPERDAL ) 1 MG tablet Take 1 tablet (1 mg total) by mouth at bedtime. 30 tablet 1   simvastatin  (ZOCOR ) 20 MG tablet Take 1 tablet (20 mg total) by mouth at bedtime. 100 tablet 2   terbinafine  (LAMISIL ) 1 % cream Apply 1 Application topically 2 (two) times daily. 30 g 0   tirzepatide  (MOUNJARO ) 15 MG/0.5ML Pen Inject 15 mg into the skin once a week for 28 days. 6 mL 1   traZODone   (DESYREL ) 100 MG tablet Take 1 tablet (100 mg total) by mouth at bedtime. 30 tablet 1   triamcinolone  (KENALOG ) 0.025 % ointment Apply to affected skin 2 (two) times daily. 30 g 0   VITAMIN E PO Take 1 capsule by mouth daily.     No current facility-administered medications on file prior to visit.    BP 120/82   Pulse 82   Temp 98.4 F (36.9 C) (Oral)   Ht 5' 1 (1.549 m)   Wt 165 lb (74.8 kg)   SpO2 98%   BMI 31.18 kg/m       Objective:   Physical Exam Vitals and nursing note reviewed.  Constitutional:      Appearance: Normal appearance. She is obese.  Cardiovascular:     Rate and Rhythm: Normal rate and regular rhythm.     Pulses: Normal pulses.     Heart sounds: Normal heart sounds.  Pulmonary:     Effort: Pulmonary effort is normal.     Breath sounds: Normal breath sounds.  Skin:    General: Skin is warm and dry.  Neurological:     General: No focal deficit present.     Mental Status: She is alert and oriented to person, place, and time.     Sensory: No sensory deficit.  Psychiatric:        Mood and Affect: Mood normal.        Behavior: Behavior normal.        Thought Content: Thought content normal.        Cognition and Memory: Cognition is not impaired. Memory is not impaired. She does not exhibit impaired recent memory or impaired remote memory.        Judgment: Judgment normal.        Assessment & Plan:  Assessment and Plan    Cognitive impairment   Cognitive symptoms with MMSE 26/30 (she missed the month and delayed recall). Differential includes age-related memory loss and vitamin deficiencies. Normal thyroid , kidney function, and electrolytes. - Ordered vitamin B12 and vitamin D  levels. - Ordered MRI of the brain. - Monitor cognitive symptoms for progression.  Type 2 diabetes mellitus Recent hypoglycemia episodes, HbA1c 6.5%. Lantus  30 units, high soda intake.  - Reduce Lantus  to 26- 28 units. - Encouraged reduction in soda consumption.      I  personally spent a total of 30 minutes in the care of the patient today including preparing to see the patient, getting/reviewing separately obtained history, performing a medically appropriate exam/evaluation, counseling and educating, placing orders, and documenting clinical information in the EHR.

## 2024-06-17 DIAGNOSIS — F205 Residual schizophrenia: Secondary | ICD-10-CM | POA: Diagnosis not present

## 2024-06-21 ENCOUNTER — Other Ambulatory Visit: Payer: Self-pay

## 2024-06-21 ENCOUNTER — Other Ambulatory Visit: Payer: Self-pay | Admitting: Adult Health

## 2024-06-21 ENCOUNTER — Other Ambulatory Visit (HOSPITAL_COMMUNITY): Payer: Self-pay

## 2024-06-21 DIAGNOSIS — E876 Hypokalemia: Secondary | ICD-10-CM

## 2024-06-22 ENCOUNTER — Other Ambulatory Visit: Payer: Self-pay

## 2024-06-22 ENCOUNTER — Other Ambulatory Visit (HOSPITAL_COMMUNITY): Payer: Self-pay

## 2024-06-22 MED ORDER — POTASSIUM CHLORIDE CRYS ER 20 MEQ PO TBCR
20.0000 meq | EXTENDED_RELEASE_TABLET | Freq: Every day | ORAL | 1 refills | Status: AC
  Filled 2024-06-22: qty 90, 90d supply, fill #0

## 2024-06-22 MED ORDER — FERROUS SULFATE 325 (65 FE) MG PO TBEC
325.0000 mg | DELAYED_RELEASE_TABLET | Freq: Every day | ORAL | 1 refills | Status: AC
  Filled 2024-06-22: qty 90, 90d supply, fill #0

## 2024-07-02 DIAGNOSIS — F205 Residual schizophrenia: Secondary | ICD-10-CM | POA: Diagnosis not present

## 2024-07-09 ENCOUNTER — Telehealth: Payer: Self-pay | Admitting: Adult Health

## 2024-07-09 NOTE — Telephone Encounter (Signed)
 Handicap Placard form yo be filled out- placed in provider's folder.  Patient would like form to be mailed to her home upon completion.

## 2024-07-13 NOTE — Telephone Encounter (Signed)
 Noted! Please advise if ok to fill

## 2024-07-14 NOTE — Telephone Encounter (Signed)
Pt notified that form is ready for pickup

## 2024-07-14 NOTE — Telephone Encounter (Signed)
 Noted

## 2024-07-16 ENCOUNTER — Telehealth (HOSPITAL_COMMUNITY): Admitting: Psychiatry

## 2024-07-18 ENCOUNTER — Other Ambulatory Visit (HOSPITAL_COMMUNITY): Payer: Self-pay

## 2024-07-19 ENCOUNTER — Other Ambulatory Visit (HOSPITAL_COMMUNITY): Payer: Self-pay

## 2024-07-19 ENCOUNTER — Other Ambulatory Visit: Payer: Self-pay

## 2024-08-13 ENCOUNTER — Encounter (HOSPITAL_COMMUNITY): Payer: Self-pay | Admitting: Psychiatry

## 2024-08-13 ENCOUNTER — Ambulatory Visit: Payer: Medicare (Managed Care) | Admitting: Adult Health

## 2024-08-13 ENCOUNTER — Other Ambulatory Visit: Payer: Self-pay

## 2024-08-13 ENCOUNTER — Telehealth (HOSPITAL_COMMUNITY): Payer: Medicare (Managed Care) | Admitting: Psychiatry

## 2024-08-13 ENCOUNTER — Other Ambulatory Visit (HOSPITAL_COMMUNITY): Payer: Self-pay

## 2024-08-13 ENCOUNTER — Encounter: Payer: Self-pay | Admitting: Adult Health

## 2024-08-13 VITALS — Wt 162.0 lb

## 2024-08-13 VITALS — BP 120/80 | HR 80 | Temp 98.4°F | Ht 61.0 in | Wt 162.0 lb

## 2024-08-13 DIAGNOSIS — R4189 Other symptoms and signs involving cognitive functions and awareness: Secondary | ICD-10-CM | POA: Diagnosis not present

## 2024-08-13 DIAGNOSIS — R251 Tremor, unspecified: Secondary | ICD-10-CM | POA: Diagnosis not present

## 2024-08-13 DIAGNOSIS — F250 Schizoaffective disorder, bipolar type: Secondary | ICD-10-CM | POA: Diagnosis not present

## 2024-08-13 DIAGNOSIS — F5101 Primary insomnia: Secondary | ICD-10-CM | POA: Diagnosis not present

## 2024-08-13 DIAGNOSIS — Z794 Long term (current) use of insulin: Secondary | ICD-10-CM | POA: Diagnosis not present

## 2024-08-13 DIAGNOSIS — E1169 Type 2 diabetes mellitus with other specified complication: Secondary | ICD-10-CM | POA: Diagnosis not present

## 2024-08-13 DIAGNOSIS — I10 Essential (primary) hypertension: Secondary | ICD-10-CM | POA: Diagnosis not present

## 2024-08-13 DIAGNOSIS — F411 Generalized anxiety disorder: Secondary | ICD-10-CM

## 2024-08-13 LAB — POCT GLYCOSYLATED HEMOGLOBIN (HGB A1C): Hemoglobin A1C: 6.7 % — AB (ref 4.0–5.6)

## 2024-08-13 MED ORDER — BENZTROPINE MESYLATE 0.5 MG PO TABS
0.5000 mg | ORAL_TABLET | Freq: Every day | ORAL | 2 refills | Status: AC
  Filled 2024-08-13 – 2024-09-01 (×3): qty 30, 30d supply, fill #0

## 2024-08-13 MED ORDER — RISPERIDONE 1 MG PO TABS
1.0000 mg | ORAL_TABLET | Freq: Every day | ORAL | 2 refills | Status: AC
  Filled 2024-08-13 – 2024-09-01 (×3): qty 30, 30d supply, fill #0

## 2024-08-13 MED ORDER — DULOXETINE HCL 30 MG PO CPEP
30.0000 mg | ORAL_CAPSULE | Freq: Two times a day (BID) | ORAL | 2 refills | Status: AC
  Filled 2024-08-13 – 2024-09-01 (×3): qty 60, 30d supply, fill #0

## 2024-08-13 MED ORDER — TRAZODONE HCL 100 MG PO TABS
100.0000 mg | ORAL_TABLET | Freq: Every day | ORAL | 22 refills | Status: AC
  Filled 2024-08-13 – 2024-09-01 (×3): qty 30, 30d supply, fill #0

## 2024-08-13 NOTE — Progress Notes (Signed)
 " Alexandra [name]   Patient Location: Mother[facility]s Home  Provider Location: Home Office  I connect with patient by video and verified that I am speaking with correct person by using two identifiers. I discussed the limitations of evaluation and management by telemedicine and the availability of in person appointments. I also discussed with the patient that there may be a patient responsible charge related to this service. The patient expressed understanding and agreed to proceed.  Alexandra [name] [id number] 64 y.o.  08/13/2024 9:03 AM  History of Present Illness:  Patient is evaluated by video session.  She is at her mother's home.  She reported things are going okay.  On the last visit we cut down the trazodone  and risperidone  because she was complaining of dry mouth, constipation.  She reported since dose reduced she is doing better.  She has no longer constipation and dry mouth.  She still have tremors but does not interfere in her daily activities.  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  Patient told Christmas was good because she was able to see all her children except 1 daughter who lives in [place]  and could not come.  She denies drinking or using any illegal substances.  She denies any paranoia, hallucination, anger, irritability.  She denies any panic attack.  Her sleep is good with the trazodone  and she does not need to take the melatonin.  She does not get overwhelmed around people.  She lost few pounds.  Recently had a visit with primary care and labs were drawn.  Her hemoglobin A1c slightly increased from 6.5-6.7.  Patient told she is working on her blood sugar.  She denies drinking or using any illegal substances.  Her attention concentration is fair.  She denies any agitation.  She is able to do short distance drive to the grocery stores.  Past Psychiatric History: H/O depression, anxiety,  schizoaffective disorder and overdose on Invega  and Seroquel  in 2015. Follow-up at [facility]. As per EMR took Klonopin , Celexa , Lexapro , mirtazapine , Seroquel  and Invega . No history of drugs or alcohol. No history of abuse.   Past Medical History:  Diagnosis Date   Allergy    Anemia    past hx   Blood transfusion without reported diagnosis    Diabetes mellitus (HCC)    Fatty liver    GERD (gastroesophageal reflux disease)    prn med.   Hiatal hernia 07/29/1996   Hyperlipidemia    Hyperplastic colon polyp    Hypertension    new dx. - will start med. 06/28/2011   Hypothyroidism    Neuromuscular disorder (HCC)    hemiplegia , neuropathy   Rotator cuff tear, right    Stroke [facility])    TIA 2016    Outpatient Encounter Medications as of 08/13/2024  Medication Sig   acetaminophen  (TYLENOL ) 500 MG tablet Take 2 tablets (1,000 mg total) by mouth every 6 (six) hours as needed for mild pain or moderate pain. (Patient taking differently: Take 1,000 mg by mouth as needed for mild pain (pain score 1-3) or moderate pain (pain score 4-6).)   albuterol  (VENTOLIN  HFA) 108 (90 Base) MCG/ACT inhaler TAKE 2 PUFFS BY MOUTH EVERY 6 HOURS AS NEEDED FOR WHEEZE OR SHORTNESS OF BREATH (Patient taking differently: as needed.)   B Complex Vitamins (B COMPLEX PO) Take 1 tablet by mouth daily.   B-D UF III MINI PEN NEEDLES 31G X 5 MM MISC USE WITH  INSULIN  PEN AS DIRECTED   benztropine  (COGENTIN ) 0.5 MG tablet Take 1 tablet (0.5 mg total) by mouth at bedtime.   BIOTIN PO Take 1 tablet by mouth daily.   bisoprolol -hydrochlorothiazide  (ZIAC ) 10-6.25 MG tablet Take 1 tablet by mouth daily.   Blood Glucose Monitoring Suppl (ONETOUCH VERIO FLEX SYSTEM) w/Device KIT use to check blood glucose 4 times daily or as needed   Cholecalciferol (VITAMIN D3 PO) Take 1 tablet by mouth daily.   Continuous Glucose Sensor (FREESTYLE LIBRE 3 SENSOR) MISC Place 1 sensor on the skin every 14 days. Use to check glucose continuously    DULoxetine  (CYMBALTA ) 30 MG capsule Take 1 capsule (30 mg total) by mouth 2 (two) times daily.   ferrous sulfate  325 (65 FE) MG EC tablet Take 1 tablet (325 mg total) by mouth daily with breakfast.   fluticasone  (FLONASE ) 50 MCG/ACT nasal spray USE 2 SPRAYS IN EACH NOSTRIL DAILY (Patient taking differently: 2 sprays daily as needed for allergies or rhinitis.)   gabapentin  (NEURONTIN ) 400 MG capsule Take 2 capsules (800 mg total) by mouth 3 (three) times daily.   glucose blood (ONETOUCH VERIO) test strip USE TO CHECK BLOOD SUGAR 4 TIMES DAILY OR AS NEEDED   HYDROcodone -acetaminophen  (NORCO/VICODIN) 5-325 MG tablet Take 1 tablet by mouth every 8 (eight) hours as needed for moderate pain.   insulin  glargine (LANTUS  SOLOSTAR) 100 UNIT/ML Solostar Pen Inject 30 Units into the skin daily.   Lancet Devices MISC Use to check blood sugar TID   levothyroxine  (SYNTHROID ) 100 MCG tablet Take 1 tablet (100 mcg total) by mouth daily.   linaclotide  (LINZESS ) 145 MCG CAPS capsule Take 1 capsule (145 mcg total) by mouth daily before breakfast.   meloxicam  (MOBIC ) 7.5 MG tablet Take 1 tablet (7.5 mg total) by mouth daily.   metFORMIN  (GLUCOPHAGE ) 1000 MG tablet Take 1 tablet (1,000 mg total) by mouth daily with breakfast.   methocarbamol  (ROBAXIN ) 500 MG tablet Take by mouth.   ondansetron  (ZOFRAN ) 4 MG tablet Take 1 tablet (4 mg total) by mouth every 8 (eight) hours as needed for nausea or vomiting.   ONE TOUCH LANCETS MISC Test blood sugars once daily Dx: E11.9   oxybutynin  (DITROPAN -XL) 5 MG 24 hr tablet Take 1 tablet (5 mg total) by mouth at bedtime.   pantoprazole  (PROTONIX ) 40 MG tablet Take 1 tablet (40 mg total) by mouth daily.   potassium chloride  SA (KLOR-CON  M) 20 MEQ tablet Take 1 tablet (20 mEq total) by mouth daily.   risperiDONE  (RISPERDAL ) 1 MG tablet Take 1 tablet (1 mg total) by mouth at bedtime.   simvastatin  (ZOCOR ) 20 MG tablet Take 1 tablet (20 mg total) by mouth at bedtime.   terbinafine   (LAMISIL ) 1 % cream Apply 1 Application topically 2 (two) times daily.   tirzepatide  (MOUNJARO ) 15 MG/0.5ML Pen Inject 15 mg into the skin once a week for 28 days.   traZODone  (DESYREL ) 100 MG tablet Take 1 tablet (100 mg total) by mouth at bedtime.   triamcinolone  (KENALOG ) 0.025 % ointment Apply to affected skin 2 (two) times daily.   Vitamin D , Ergocalciferol , (DRISDOL ) 1.25 MG (50000 UNIT) CAPS capsule Take 1 capsule (50,000 Units total) by mouth every 7 (seven) days.   VITAMIN E PO Take 1 capsule by mouth daily.   No facility-administered encounter medications on file as of 08/13/2024.    Recent Results (from the past 2160 hours)  VITAMIN D  25 Hydroxy (Vit-D Deficiency, Fractures)     Status: Abnormal  Collection Time: 06/11/24  8:27 AM  Result Value Ref Range   VITD 12.76 (L) 30.00 - 100.00 ng/mL  Vitamin B12     Status: None   Collection Time: 06/11/24  8:27 AM  Result Value Ref Range   Vitamin B-12 539 211 - 911 pg/mL  POC HgB A1c     Status: Abnormal   Collection Time: 08/13/24  8:15 AM  Result Value Ref Range   Hemoglobin A1C 6.7 (A) 4.0 - 5.6 %   HbA1c POC (<> result, manual entry)     HbA1c, POC (prediabetic range)     HbA1c, POC (controlled diabetic range)       Psychiatric Specialty Exam: Physical Exam  Review of Systems  Neurological:  Positive for numbness.    Weight 162 lb (73.5 kg).There is no height or weight on file to calculate BMI.  General Appearance: Casual  Eye Contact:  Good  Speech:  Slow  Volume:  Normal  Mood:  Euthymic  Affect:  Appropriate  Thought Process:  Goal Directed  Orientation:  Full (Time, Place, and Person)  Thought Content:  WDL  Suicidal Thoughts:  No  Homicidal Thoughts:  No  Memory:  Immediate;   Good Recent;   Fair Remote;   Fair  Judgement:  Intact  Insight:  Present  Psychomotor Activity:  Tremor  Concentration:  Concentration: Fair and Attention Span: Fair  Recall:  Fair  Fund of Knowledge:  Good  Language:  Good   Akathisia:  No  Handed:  Right  AIMS (if indicated):     Assets:  Communication Skills Desire for Improvement Social Support Transportation  ADL's:  Intact  Cognition:  WNL  Sleep:  ok with Trazodone         08/13/2024    8:07 AM 02/10/2024    8:26 AM 09/02/2023   10:03 AM 03/14/2023    9:30 AM 05/03/2022    8:14 AM  Depression screen PHQ 2/9  Decreased Interest 0 0 0 0 0  Down, Depressed, Hopeless 0 0 0 0 0  PHQ - 2 Score 0 0 0 0 0  Altered sleeping 0 0   0  Tired, decreased energy 0 0   0  Change in appetite 0 0   0  Feeling bad or failure about yourself  0 0   0  Trouble concentrating 0 0   0  Moving slowly or fidgety/restless 0 0   0  Suicidal thoughts 0 0   0  PHQ-9 Score 0 0    0   Difficult doing work/chores Not difficult at all Not difficult at all   Not difficult at all     Data saved with a previous flowsheet row definition    Assessment/Plan: Schizoaffective disorder, bipolar type (HCC) - Plan: benztropine  (COGENTIN ) 0.5 MG tablet, DULoxetine  (CYMBALTA ) 30 MG capsule, risperiDONE  (RISPERDAL ) 1 MG tablet  Tremor - Plan: benztropine  (COGENTIN ) 0.5 MG tablet  GAD (generalized anxiety disorder) - Plan: DULoxetine  (CYMBALTA ) 30 MG capsule, risperiDONE  (RISPERDAL ) 1 MG tablet, traZODone  (DESYREL ) 100 MG tablet  Primary insomnia - Plan: risperiDONE  (RISPERDAL ) 1 MG tablet, traZODone  (DESYREL ) 100 MG tablet  Patient is 64 year old African-American female with history of TIA, hypertension, hypothyroidism, diabetes, chronic pain, neuropathy, schizoaffective disorder, insomnia, generalized anxiety disorder and tremors.  Reviewed blood work results.  Hemoglobin A1c slightly increased from 6.5-6.7.  The side effects of constipation and dry mouth are much better since dose reduced of the risperidone  and trazodone .  Discussed medication side effects  and benefits.  Patient is comfortable with the current dose.  She does not need melatonin as sleeping good with the trazodone .   Continue trazodone  100 mg at bedtime, risperidone  1 mg daily, Cymbalta  30 mg twice a day and Cogentin  0.5 mg at bedtime.  Recommended to call back if she has any question or any concern.  Follow-up in 3 months.  Follow Up Instructions:     I discussed the assessment and treatment plan with the patient. The patient was provided an opportunity to ask questions and all were answered. The patient agreed with the plan and demonstrated an understanding of the instructions.   The patient was advised to call back or seek an in-person evaluation if the symptoms worsen or if the condition fails to improve as anticipated.    Collaboration of Care: Other provider involved in patient's care AEB notes are available in epic to review  Patient/Guardian was advised Release of Information must be obtained prior to any record release in order to collaborate their care with an outside provider. Patient/Guardian was advised if they have not already done so to contact the registration department to sign all necessary forms in order for us  to release information regarding their care.   Consent: Patient/Guardian gives verbal consent for treatment and assignment of benefits for services provided during this visit. Patient/Guardian expressed understanding and agreed to proceed.     Total encounter time 22 minutes which includes face-to-face time, chart reviewed, care coordination, order entry and documentation during this encounter.   [name]: This document was prepared by [employer] voice dictation technology and any errors that results from this process are unintentional.    Alexandra [name] Client, MD 08/13/2024   "

## 2024-08-13 NOTE — Patient Instructions (Addendum)
 SABRA

## 2024-08-13 NOTE — Progress Notes (Signed)
 "  Established Patient Office Visit  Subjective   Patient ID: Alexandra [name], female    DOB: 03-Dec-1960  Age: 64 y.o. MRN: [id number]  This is a 64 year old patient who presents for follow-up of essential hypertension and diabetes mellitus. Patient reports that she feels well overall. She states that she has noticed a slight decrease in her appetite lately but denies nausea, vomiting, or abdominal pain.   Hypertension - Patient continues bisoprolol -hydrochlorothiazide  10/6.25 mg daily with no concerns.   BP Readings from Last 3 Encounters:  08/13/24 120/80  06/11/24 120/82  05/12/24 120/70     Diabetes mellitus - Patient continues Lantus  30 units daily, Mounjaro  15 mg weekly, and metformin  1000 mg daily. Feels like she is eating well but has noticed a recent decrease in appetite. Patient reports that she continues to experience 1-2 hypoglycemic episodes at night each week with blood sugars being in the 50s. She usually eats a few raisins to increase her blood sugar.   Lab Results  Component Value Date   HGBA1C 6.7 (A) 08/13/2024   HGBA1C 6.5 (A) 05/12/2024   HGBA1C 6.7 (A) 02/10/2024    Cognitive impairment - Patient reports that she continues to be forgetful but denies worsening or any new symptoms. She states that she has not had the previously-ordered MRI completed. She denies weakness, headaches, dizziness, or vision changes.   ROS See HPI   Objective:     BP 120/80   Pulse 80   Temp 98.4 F (36.9 C) (Oral)   Ht 5' 1 (1.549 m)   Wt 73.5 kg   SpO2 96%   BMI 30.61 kg/m    Physical Exam Vitals reviewed.  Constitutional:      General: She is not in acute distress.    Appearance: Normal appearance. She is obese. She is not ill-appearing.  HENT:     Head: Normocephalic and atraumatic.     Nose: No congestion or rhinorrhea.  Eyes:     Conjunctiva/sclera: Conjunctivae normal.     Pupils: Pupils are equal, round, and reactive to light.  Cardiovascular:      Rate and Rhythm: Normal rate and regular rhythm.     Pulses: Normal pulses.     Heart sounds: Normal heart sounds.  Pulmonary:     Effort: Pulmonary effort is normal. No respiratory distress.     Breath sounds: Normal breath sounds.  Abdominal:     General: Bowel sounds are normal. There is no distension.     Palpations: Abdomen is soft.     Tenderness: There is no abdominal tenderness.  Musculoskeletal:        General: Normal range of motion.     Cervical back: Normal range of motion.  Skin:    General: Skin is warm and dry.  Neurological:     Mental Status: She is alert and oriented to person, place, and time.  Psychiatric:        Mood and Affect: Mood normal.        Behavior: Behavior normal.        Thought Content: Thought content normal.     Results for orders placed or performed in visit on 08/13/24  POC HgB A1c  Result Value Ref Range   Hemoglobin A1C 6.7 (A) 4.0 - 5.6 %   HbA1c POC (<> result, manual entry)     HbA1c, POC (prediabetic range)     HbA1c, POC (controlled diabetic range)      Wt Readings from  Last 3 Encounters:  08/13/24 73.5 kg  06/11/24 74.8 kg  05/12/24 74.8 kg     The ASCVD Risk score (Arnett DK, et al., 2019) failed to calculate for the following reasons:   Risk score cannot be calculated because patient has a medical history suggesting prior/existing ASCVD   * - Cholesterol units were assumed    Assessment & Plan:   1. Type 2 diabetes mellitus with other specified complication, with long-term current use of insulin  (HCC) (Primary) - POC HgB A1c 6.7 today - Nocturnal hypoglycemic episodes. Encouraged patient to drink a Glucerna instead of soda before bed - Continue Lantus  30 units daily, Mounjaro  15 mg weekly, and metformin  1000 mg daily - Encouraged patient to eat healthy and to increase physical activity  2. Essential hypertension - Stable on bisoprolol -hydrochlorothiazide . Continue same.   3. Cognitive impairment - Reports continued  concerns with being forgetful.  - Patient has not had previously-ordered MRI completed.  - Provided her with the telephone number to call to schedule.   - Follow-up in office in 3 months  Debby CHRISTELLA Borer, RN FNP Student  "

## 2024-08-16 ENCOUNTER — Telehealth: Payer: Self-pay

## 2024-08-16 ENCOUNTER — Ambulatory Visit: Payer: Medicare (Managed Care)

## 2024-08-16 NOTE — Telephone Encounter (Signed)
 Unsuccessful attempts to reach patient on preferred number listed in notes for scheduled AWV. Left message on voicemail okay to reschedule.

## 2024-08-18 ENCOUNTER — Other Ambulatory Visit: Payer: Self-pay

## 2024-08-22 ENCOUNTER — Other Ambulatory Visit (HOSPITAL_COMMUNITY): Payer: Self-pay

## 2024-08-22 ENCOUNTER — Other Ambulatory Visit: Payer: Self-pay | Admitting: Adult Health

## 2024-08-22 DIAGNOSIS — Z7985 Long-term (current) use of injectable non-insulin antidiabetic drugs: Secondary | ICD-10-CM

## 2024-08-23 ENCOUNTER — Other Ambulatory Visit (HOSPITAL_COMMUNITY): Payer: Self-pay

## 2024-08-23 ENCOUNTER — Other Ambulatory Visit: Payer: Self-pay

## 2024-08-23 MED ORDER — MOUNJARO 15 MG/0.5ML ~~LOC~~ SOAJ
15.0000 mg | SUBCUTANEOUS | 0 refills | Status: AC
  Filled 2024-08-23 – 2024-09-01 (×2): qty 6, 84d supply, fill #0

## 2024-08-24 ENCOUNTER — Encounter: Payer: Self-pay | Admitting: Pharmacist

## 2024-08-24 ENCOUNTER — Other Ambulatory Visit: Payer: Self-pay

## 2024-08-25 ENCOUNTER — Other Ambulatory Visit: Payer: Self-pay

## 2024-08-26 ENCOUNTER — Other Ambulatory Visit (HOSPITAL_COMMUNITY): Payer: Self-pay

## 2024-08-27 ENCOUNTER — Other Ambulatory Visit: Payer: Self-pay

## 2024-08-30 ENCOUNTER — Other Ambulatory Visit: Payer: Self-pay

## 2024-09-01 ENCOUNTER — Other Ambulatory Visit (HOSPITAL_COMMUNITY): Payer: Self-pay

## 2024-09-01 ENCOUNTER — Other Ambulatory Visit: Payer: Self-pay

## 2024-09-09 ENCOUNTER — Other Ambulatory Visit: Payer: Medicare (Managed Care)

## 2024-11-08 ENCOUNTER — Telehealth (HOSPITAL_COMMUNITY): Payer: Medicare (Managed Care) | Admitting: Psychiatry
